# Patient Record
Sex: Male | Born: 1955 | Race: White | Hispanic: No | Smoking: Former smoker
Health system: Southern US, Community
[De-identification: ages and names within clinical notes are randomized; demographics above are authoritative.]

## PROBLEM LIST (undated history)

## (undated) ENCOUNTER — Ambulatory Visit

## (undated) ENCOUNTER — Encounter

## (undated) ENCOUNTER — Telehealth

## (undated) ENCOUNTER — Encounter: Attending: Cardiovascular Disease | Primary: Cardiovascular Disease

## (undated) ENCOUNTER — Encounter: Attending: Adult Health | Primary: Adult Health

## (undated) ENCOUNTER — Ambulatory Visit: Payer: MEDICARE | Attending: Cardiovascular Disease | Primary: Cardiovascular Disease

## (undated) ENCOUNTER — Ambulatory Visit: Payer: MEDICARE

## (undated) ENCOUNTER — Telehealth: Attending: Adult Health | Primary: Adult Health

## (undated) ENCOUNTER — Encounter
Attending: Pharmacist Clinician (PhC)/ Clinical Pharmacy Specialist | Primary: Pharmacist Clinician (PhC)/ Clinical Pharmacy Specialist

## (undated) DIAGNOSIS — I259 Chronic ischemic heart disease, unspecified: Secondary | ICD-10-CM

## (undated) DIAGNOSIS — E119 Type 2 diabetes mellitus without complications: Secondary | ICD-10-CM

## (undated) DIAGNOSIS — I5023 Acute on chronic systolic (congestive) heart failure: Secondary | ICD-10-CM

## (undated) DIAGNOSIS — Z5189 Encounter for other specified aftercare: Secondary | ICD-10-CM

## (undated) DIAGNOSIS — Z95810 Presence of automatic (implantable) cardiac defibrillator: Secondary | ICD-10-CM

## (undated) DIAGNOSIS — I472 Ventricular tachycardia: Secondary | ICD-10-CM

## (undated) DIAGNOSIS — I252 Old myocardial infarction: Secondary | ICD-10-CM

## (undated) DIAGNOSIS — R1013 Epigastric pain: Secondary | ICD-10-CM

## (undated) DIAGNOSIS — E785 Hyperlipidemia, unspecified: Secondary | ICD-10-CM

## (undated) DIAGNOSIS — E039 Hypothyroidism, unspecified: Secondary | ICD-10-CM

## (undated) DIAGNOSIS — N189 Chronic kidney disease, unspecified: Secondary | ICD-10-CM

## (undated) DIAGNOSIS — I509 Heart failure, unspecified: Secondary | ICD-10-CM

## (undated) DIAGNOSIS — K219 Gastro-esophageal reflux disease without esophagitis: Secondary | ICD-10-CM

## (undated) DIAGNOSIS — I2510 Atherosclerotic heart disease of native coronary artery without angina pectoris: Secondary | ICD-10-CM

## (undated) DIAGNOSIS — Z8639 Personal history of other endocrine, nutritional and metabolic disease: Secondary | ICD-10-CM

## (undated) DIAGNOSIS — Z862 Personal history of diseases of the blood and blood-forming organs and certain disorders involving the immune mechanism: Secondary | ICD-10-CM

## (undated) DIAGNOSIS — D649 Anemia, unspecified: Secondary | ICD-10-CM

## (undated) DIAGNOSIS — E669 Obesity, unspecified: Secondary | ICD-10-CM

## (undated) DIAGNOSIS — Z8679 Personal history of other diseases of the circulatory system: Secondary | ICD-10-CM

## (undated) DIAGNOSIS — E079 Disorder of thyroid, unspecified: Secondary | ICD-10-CM

## (undated) DIAGNOSIS — I10 Essential (primary) hypertension: Secondary | ICD-10-CM

## (undated) HISTORY — PX: CARDIAC ASSIST DEVICE REMOVAL: SHX1289

## (undated) HISTORY — DX: Acute on chronic systolic (congestive) heart failure: I50.23

## (undated) HISTORY — DX: Gastro-esophageal reflux disease without esophagitis: K21.9

## (undated) HISTORY — DX: Chronic ischemic heart disease, unspecified: I25.9

## (undated) HISTORY — DX: Ventricular tachycardia: I47.2

## (undated) HISTORY — PX: CORONARY ARTERY BYPASS GRAFT: SHX141

## (undated) HISTORY — DX: Heart failure, unspecified: I50.9

## (undated) HISTORY — DX: Type 2 diabetes mellitus without complications: E11.9

## (undated) HISTORY — PX: CARDIAC CATHETERIZATION: SHX172

## (undated) HISTORY — PX: COLONOSCOPY: SHX174

## (undated) HISTORY — DX: Essential (primary) hypertension: I10

## (undated) HISTORY — DX: Anemia, unspecified: D64.9

## (undated) HISTORY — DX: Epigastric pain: R10.13

## (undated) HISTORY — DX: Hyperlipidemia, unspecified: E78.5

## (undated) HISTORY — DX: Personal history of other endocrine, nutritional and metabolic disease: Z86.39

## (undated) HISTORY — DX: Obesity, unspecified: E66.9

## (undated) HISTORY — DX: Personal history of other diseases of the circulatory system: Z86.79

## (undated) HISTORY — DX: Presence of automatic (implantable) cardiac defibrillator: Z95.810

## (undated) HISTORY — DX: Atherosclerotic heart disease of native coronary artery without angina pectoris: I25.10

## (undated) HISTORY — DX: Encounter for other specified aftercare: Z51.89

## (undated) HISTORY — DX: Personal history of diseases of the blood and blood-forming organs and certain disorders involving the immune mechanism: Z86.2

## (undated) HISTORY — DX: Disorder of thyroid, unspecified: E07.9

## (undated) HISTORY — DX: Old myocardial infarction: I25.2

## (undated) HISTORY — PX: ANGIOPLASTY: SHX39

## (undated) MED ORDER — MAGNESIUM OXIDE 500 MG TABLET
Freq: Two times a day (BID) | ORAL | 0 days
Start: ? — End: 2020-10-22

## (undated) MED ORDER — EZETIMIBE 10 MG TABLET: Freq: Every day | ORAL | 0.00000 days

---
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
## 1998-10-29 ENCOUNTER — Emergency Department (HOSPITAL_COMMUNITY): Admission: EM | Admit: 1998-10-29 | Discharge: 1998-10-29 | Payer: Self-pay | Admitting: Emergency Medicine

## 1998-10-29 ENCOUNTER — Encounter: Payer: Self-pay | Admitting: Emergency Medicine

## 1999-04-20 ENCOUNTER — Inpatient Hospital Stay (HOSPITAL_COMMUNITY): Admission: AD | Admit: 1999-04-20 | Discharge: 1999-04-30 | Payer: Self-pay | Admitting: Internal Medicine

## 1999-04-20 ENCOUNTER — Encounter: Payer: Self-pay | Admitting: Internal Medicine

## 1999-04-21 ENCOUNTER — Encounter: Payer: Self-pay | Admitting: Cardiology

## 1999-04-23 ENCOUNTER — Encounter: Payer: Self-pay | Admitting: Cardiothoracic Surgery

## 1999-04-24 ENCOUNTER — Encounter: Payer: Self-pay | Admitting: Cardiothoracic Surgery

## 1999-04-26 ENCOUNTER — Encounter: Payer: Self-pay | Admitting: Cardiothoracic Surgery

## 1999-04-27 ENCOUNTER — Encounter: Payer: Self-pay | Admitting: Cardiothoracic Surgery

## 1999-04-28 ENCOUNTER — Encounter: Payer: Self-pay | Admitting: Cardiothoracic Surgery

## 2003-11-30 ENCOUNTER — Inpatient Hospital Stay (HOSPITAL_COMMUNITY): Admission: EM | Admit: 2003-11-30 | Discharge: 2003-12-17 | Payer: Self-pay

## 2003-12-03 ENCOUNTER — Encounter (INDEPENDENT_AMBULATORY_CARE_PROVIDER_SITE_OTHER): Payer: Self-pay | Admitting: Cardiology

## 2004-07-27 ENCOUNTER — Ambulatory Visit: Payer: Self-pay

## 2004-07-31 ENCOUNTER — Encounter: Admission: RE | Admit: 2004-07-31 | Discharge: 2004-07-31 | Payer: Self-pay | Admitting: Cardiology

## 2004-08-05 ENCOUNTER — Ambulatory Visit (HOSPITAL_COMMUNITY): Admission: RE | Admit: 2004-08-05 | Discharge: 2004-08-06 | Payer: Self-pay | Admitting: Cardiology

## 2004-08-11 ENCOUNTER — Inpatient Hospital Stay (HOSPITAL_COMMUNITY): Admission: EM | Admit: 2004-08-11 | Discharge: 2004-08-13 | Payer: Self-pay | Admitting: Emergency Medicine

## 2004-08-19 ENCOUNTER — Ambulatory Visit: Payer: Self-pay | Admitting: Gastroenterology

## 2004-09-18 ENCOUNTER — Encounter: Admission: RE | Admit: 2004-09-18 | Discharge: 2004-09-18 | Payer: Self-pay | Admitting: Cardiology

## 2004-09-24 ENCOUNTER — Ambulatory Visit (HOSPITAL_COMMUNITY): Admission: RE | Admit: 2004-09-24 | Discharge: 2004-09-24 | Payer: Self-pay | Admitting: *Deleted

## 2004-10-05 ENCOUNTER — Ambulatory Visit: Payer: Self-pay | Admitting: Gastroenterology

## 2004-12-08 ENCOUNTER — Ambulatory Visit: Payer: Self-pay | Admitting: Internal Medicine

## 2005-01-28 ENCOUNTER — Ambulatory Visit: Payer: Self-pay | Admitting: Internal Medicine

## 2005-02-15 ENCOUNTER — Ambulatory Visit: Payer: Self-pay | Admitting: Internal Medicine

## 2005-05-18 ENCOUNTER — Ambulatory Visit: Payer: Self-pay | Admitting: Internal Medicine

## 2005-06-02 ENCOUNTER — Ambulatory Visit: Payer: Self-pay | Admitting: Internal Medicine

## 2005-08-17 ENCOUNTER — Ambulatory Visit: Payer: Self-pay | Admitting: Internal Medicine

## 2005-11-12 ENCOUNTER — Ambulatory Visit: Payer: Self-pay | Admitting: Internal Medicine

## 2005-11-22 ENCOUNTER — Inpatient Hospital Stay (HOSPITAL_COMMUNITY): Admission: EM | Admit: 2005-11-22 | Discharge: 2005-11-25 | Payer: Self-pay | Admitting: Emergency Medicine

## 2005-12-06 IMAGING — XA IR CV CATH FLUORO GUIDE
1 series · 2 of 2 positions shown · non-contrast
Comparison: none

CLINICAL DATA: CHF, malpositioning of the right upper extremity PICC. 
 FLUOROSCOPIC GUIDANCE FOR RIGHT UPPER EXTREMITY PICC EXCHANGE, 12/10/03, 9923 HOURS
 Procedure:  The right PICC insertion site was prepped and draped in a sterile fashion.  Lidocaine was utilized for local anesthesia.  The existing PICC was cut and exchanged over a 018 wire for a new 5 french double-lumen PICC.  This was cut to 45 cm.  This was advanced over the wire to the cavoatrial junction.  The wire was removed.  The PICC was sewn to the skin.  The PICC was flushed.  No complications were encountered.

[Series 1: run · 2 of 2 slices shown]
[im 1/2]
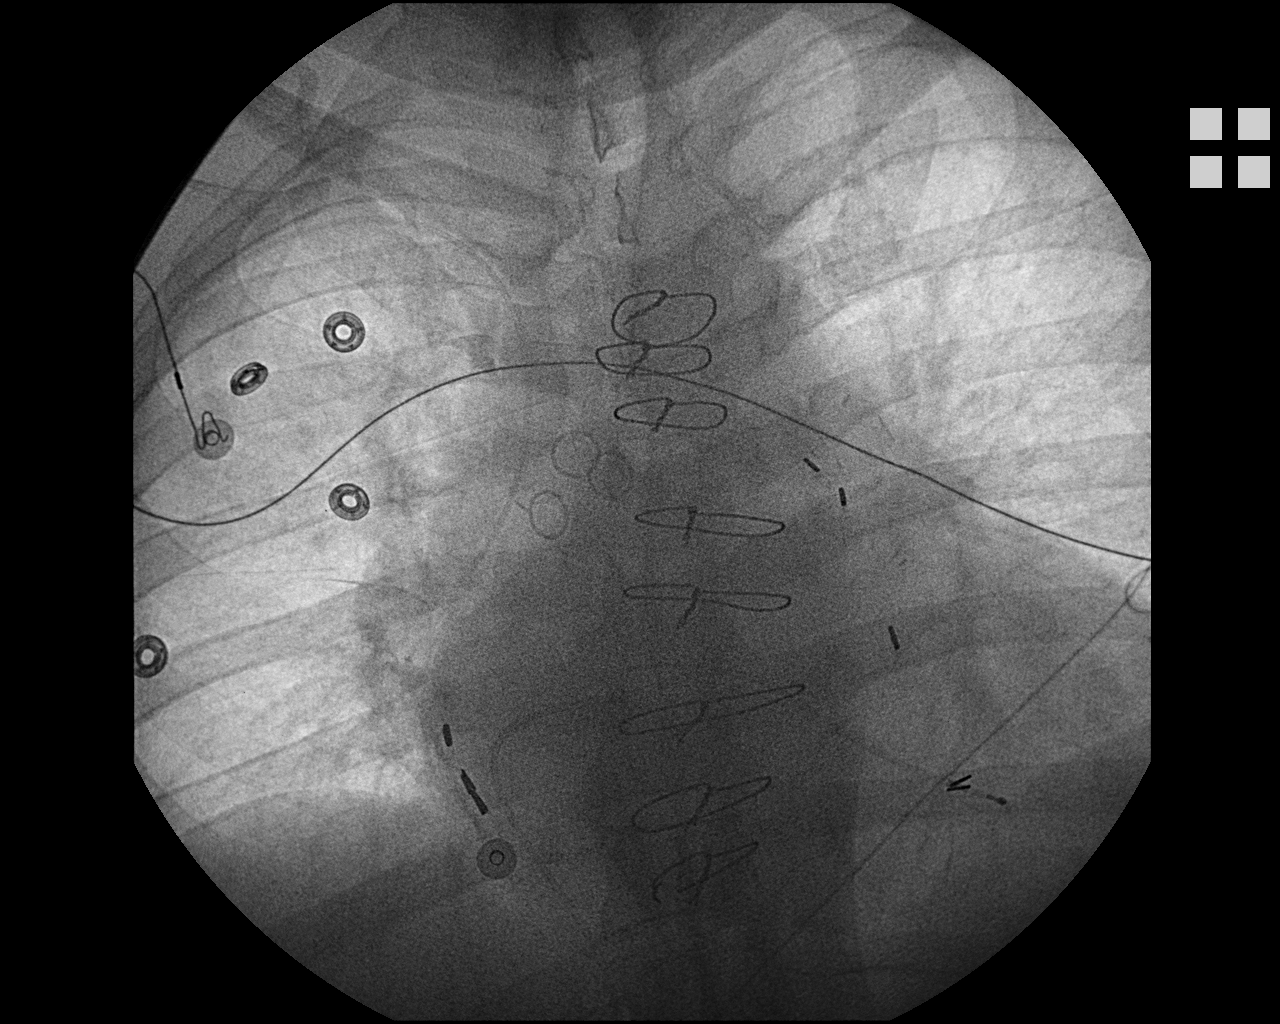
[im 2/2]
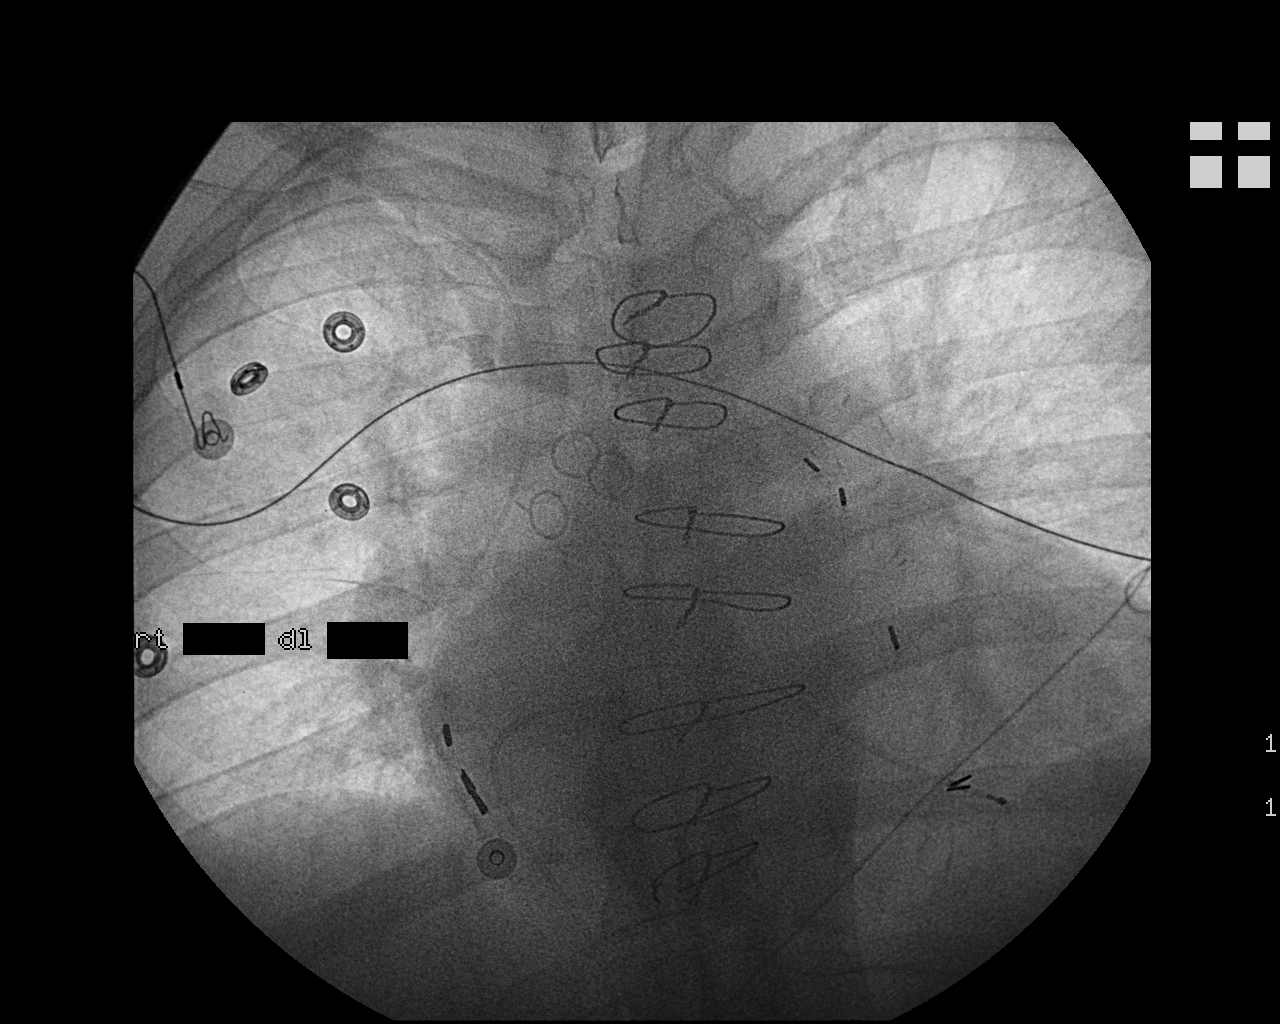

[2 of 2 positions shown; findings below may reference images not displayed]

FINDINGS: The image documents placement of a right upper extremity PICC with the tip at the cavoatrial junction.
 IMPRESSION
 Successful right upper extremity PICC exchange.

## 2006-03-15 ENCOUNTER — Ambulatory Visit: Payer: Self-pay | Admitting: Internal Medicine

## 2006-06-09 ENCOUNTER — Ambulatory Visit: Payer: Self-pay | Admitting: Internal Medicine

## 2006-07-08 ENCOUNTER — Ambulatory Visit: Payer: Self-pay

## 2006-09-13 ENCOUNTER — Ambulatory Visit: Payer: Self-pay | Admitting: Internal Medicine

## 2006-12-16 ENCOUNTER — Ambulatory Visit: Payer: Self-pay | Admitting: Internal Medicine

## 2007-03-27 ENCOUNTER — Ambulatory Visit: Payer: Self-pay | Admitting: Internal Medicine

## 2007-04-11 ENCOUNTER — Encounter: Payer: Self-pay | Admitting: Internal Medicine

## 2007-04-25 DIAGNOSIS — E785 Hyperlipidemia, unspecified: Secondary | ICD-10-CM

## 2007-04-25 DIAGNOSIS — I252 Old myocardial infarction: Secondary | ICD-10-CM | POA: Insufficient documentation

## 2007-04-25 DIAGNOSIS — E1169 Type 2 diabetes mellitus with other specified complication: Secondary | ICD-10-CM | POA: Insufficient documentation

## 2007-04-25 DIAGNOSIS — I2510 Atherosclerotic heart disease of native coronary artery without angina pectoris: Secondary | ICD-10-CM

## 2007-04-25 HISTORY — DX: Atherosclerotic heart disease of native coronary artery without angina pectoris: I25.10

## 2007-04-25 HISTORY — DX: Old myocardial infarction: I25.2

## 2007-04-25 HISTORY — DX: Hyperlipidemia, unspecified: E78.5

## 2007-06-15 ENCOUNTER — Ambulatory Visit: Payer: Self-pay | Admitting: Internal Medicine

## 2007-09-20 ENCOUNTER — Ambulatory Visit: Payer: Self-pay | Admitting: Gastroenterology

## 2007-09-21 ENCOUNTER — Encounter: Payer: Self-pay | Admitting: Internal Medicine

## 2007-09-21 ENCOUNTER — Encounter: Payer: Self-pay | Admitting: Gastroenterology

## 2007-09-21 ENCOUNTER — Ambulatory Visit: Payer: Self-pay | Admitting: Gastroenterology

## 2007-10-05 ENCOUNTER — Ambulatory Visit: Payer: Self-pay | Admitting: Internal Medicine

## 2007-10-26 ENCOUNTER — Ambulatory Visit: Payer: Self-pay | Admitting: Gastroenterology

## 2007-11-09 ENCOUNTER — Ambulatory Visit (HOSPITAL_COMMUNITY): Admission: RE | Admit: 2007-11-09 | Discharge: 2007-11-09 | Payer: Self-pay | Admitting: Gastroenterology

## 2008-01-24 ENCOUNTER — Ambulatory Visit: Payer: Self-pay | Admitting: Internal Medicine

## 2008-03-05 ENCOUNTER — Ambulatory Visit: Payer: Self-pay | Admitting: Internal Medicine

## 2008-03-05 LAB — CONVERTED CEMR LAB
Basophils Absolute: 0 10*3/uL (ref 0.0–0.1)
Bilirubin, Direct: 0.1 mg/dL (ref 0.0–0.3)
Calcium: 9.5 mg/dL (ref 8.4–10.5)
Cholesterol: 128 mg/dL (ref 0–200)
Eosinophils Absolute: 0.2 10*3/uL (ref 0.0–0.7)
GFR calc Af Amer: 82 mL/min
GFR calc non Af Amer: 68 mL/min
Glucose, Bld: 148 mg/dL — ABNORMAL HIGH (ref 70–99)
HCT: 34.9 % — ABNORMAL LOW (ref 39.0–52.0)
HDL: 24.7 mg/dL — ABNORMAL LOW (ref >39.0)
Hemoglobin: 12.1 g/dL — ABNORMAL LOW (ref 13.0–17.0)
MCHC: 34.5 g/dL (ref 30.0–36.0)
MCV: 94.4 fL (ref 78.0–100.0)
Monocytes Absolute: 0.7 10*3/uL (ref 0.1–1.0)
Neutro Abs: 7.5 10*3/uL (ref 1.4–7.7)
Platelets: 278 10*3/uL (ref 150–400)
RDW: 13.8 % (ref 11.5–14.6)
Sodium: 136 meq/L (ref 135–145)
TSH: 2.95 microintl units/mL (ref 0.35–5.50)
Total Bilirubin: 0.8 mg/dL (ref 0.3–1.2)
Total Protein: 7.7 g/dL (ref 6.0–8.3)
Triglycerides: 125 mg/dL (ref 0–149)

## 2008-03-06 ENCOUNTER — Encounter: Payer: Self-pay | Admitting: Internal Medicine

## 2008-04-02 ENCOUNTER — Encounter: Payer: Self-pay | Admitting: Internal Medicine

## 2008-04-03 ENCOUNTER — Ambulatory Visit: Payer: Self-pay | Admitting: Internal Medicine

## 2008-04-03 DIAGNOSIS — D649 Anemia, unspecified: Secondary | ICD-10-CM

## 2008-04-03 HISTORY — DX: Anemia, unspecified: D64.9

## 2008-04-03 LAB — CONVERTED CEMR LAB
Folate: 9.4 ng/mL
Transferrin: 321.5 mg/dL (ref >=212.0)
Vitamin B-12: 342 pg/mL (ref 211–911)

## 2008-04-24 ENCOUNTER — Encounter: Payer: Self-pay | Admitting: Internal Medicine

## 2008-05-14 DIAGNOSIS — E039 Hypothyroidism, unspecified: Secondary | ICD-10-CM | POA: Insufficient documentation

## 2008-05-14 DIAGNOSIS — Z8639 Personal history of other endocrine, nutritional and metabolic disease: Secondary | ICD-10-CM

## 2008-05-14 DIAGNOSIS — I152 Hypertension secondary to endocrine disorders: Secondary | ICD-10-CM | POA: Insufficient documentation

## 2008-05-14 DIAGNOSIS — Z8679 Personal history of other diseases of the circulatory system: Secondary | ICD-10-CM

## 2008-05-14 DIAGNOSIS — Z862 Personal history of diseases of the blood and blood-forming organs and certain disorders involving the immune mechanism: Secondary | ICD-10-CM

## 2008-05-14 DIAGNOSIS — I10 Essential (primary) hypertension: Secondary | ICD-10-CM | POA: Insufficient documentation

## 2008-05-14 HISTORY — DX: Personal history of diseases of the blood and blood-forming organs and certain disorders involving the immune mechanism: Z86.2

## 2008-05-14 HISTORY — DX: Personal history of diseases of the blood and blood-forming organs and certain disorders involving the immune mechanism: Z86.39

## 2008-05-14 HISTORY — DX: Personal history of other diseases of the circulatory system: Z86.79

## 2008-05-16 ENCOUNTER — Ambulatory Visit: Payer: Self-pay | Admitting: Gastroenterology

## 2008-05-21 ENCOUNTER — Ambulatory Visit: Payer: Self-pay | Admitting: Internal Medicine

## 2008-06-04 ENCOUNTER — Ambulatory Visit: Payer: Self-pay | Admitting: Gastroenterology

## 2008-06-04 ENCOUNTER — Encounter: Payer: Self-pay | Admitting: Gastroenterology

## 2008-06-05 ENCOUNTER — Encounter: Payer: Self-pay | Admitting: Gastroenterology

## 2008-07-02 ENCOUNTER — Encounter: Payer: Self-pay | Admitting: Internal Medicine

## 2008-08-07 ENCOUNTER — Encounter: Payer: Self-pay | Admitting: Internal Medicine

## 2008-08-19 ENCOUNTER — Encounter: Payer: Self-pay | Admitting: Internal Medicine

## 2008-09-05 ENCOUNTER — Ambulatory Visit: Payer: Self-pay | Admitting: Internal Medicine

## 2008-09-23 ENCOUNTER — Telehealth: Payer: Self-pay | Admitting: Internal Medicine

## 2008-10-17 ENCOUNTER — Encounter: Payer: Self-pay | Admitting: Internal Medicine

## 2008-12-02 ENCOUNTER — Ambulatory Visit: Payer: Self-pay | Admitting: Family Medicine

## 2008-12-02 LAB — CONVERTED CEMR LAB
BUN: 24 mg/dL — ABNORMAL HIGH (ref 6–23)
Chloride: 89 meq/L — ABNORMAL LOW (ref 96–112)
Creatinine, Ser: 1.1 mg/dL (ref 0.4–1.5)
GFR calc non Af Amer: 74.72 mL/min (ref >60)
Glucose, Bld: 481 mg/dL — ABNORMAL HIGH (ref 70–99)
HCT: 37.9 % — ABNORMAL LOW (ref 39.0–52.0)
Hemoglobin: 13.4 g/dL (ref 13.0–17.0)
Lymphs Abs: 1.4 10*3/uL (ref 0.7–4.0)
MCHC: 35.4 g/dL (ref 30.0–36.0)
Monocytes Absolute: 0.7 10*3/uL (ref 0.1–1.0)
Neutro Abs: 8.2 10*3/uL — ABNORMAL HIGH (ref 1.4–7.7)
Potassium: 3.9 meq/L (ref 3.5–5.1)
RDW: 13.9 % (ref 11.5–14.6)

## 2008-12-03 ENCOUNTER — Telehealth: Payer: Self-pay | Admitting: Family Medicine

## 2008-12-05 ENCOUNTER — Ambulatory Visit: Payer: Self-pay | Admitting: Family Medicine

## 2008-12-09 LAB — CONVERTED CEMR LAB: Hgb A1c MFr Bld: 10.2 % — ABNORMAL HIGH (ref 4.6–6.5)

## 2008-12-13 ENCOUNTER — Ambulatory Visit: Payer: Self-pay | Admitting: Internal Medicine

## 2008-12-13 DIAGNOSIS — E119 Type 2 diabetes mellitus without complications: Secondary | ICD-10-CM | POA: Insufficient documentation

## 2008-12-13 HISTORY — DX: Type 2 diabetes mellitus without complications: E11.9

## 2008-12-24 ENCOUNTER — Ambulatory Visit: Payer: Self-pay | Admitting: Internal Medicine

## 2009-01-01 ENCOUNTER — Encounter: Admission: RE | Admit: 2009-01-01 | Discharge: 2009-01-16 | Payer: Self-pay | Admitting: Internal Medicine

## 2009-01-01 ENCOUNTER — Encounter: Payer: Self-pay | Admitting: Internal Medicine

## 2009-01-02 ENCOUNTER — Encounter (INDEPENDENT_AMBULATORY_CARE_PROVIDER_SITE_OTHER): Payer: Self-pay | Admitting: *Deleted

## 2009-01-09 ENCOUNTER — Encounter: Payer: Self-pay | Admitting: Internal Medicine

## 2009-01-14 ENCOUNTER — Encounter: Payer: Self-pay | Admitting: Internal Medicine

## 2009-01-14 ENCOUNTER — Encounter: Admission: RE | Admit: 2009-01-14 | Discharge: 2009-01-14 | Payer: Self-pay | Admitting: Cardiology

## 2009-01-16 ENCOUNTER — Inpatient Hospital Stay (HOSPITAL_COMMUNITY): Admission: RE | Admit: 2009-01-16 | Discharge: 2009-01-18 | Payer: Self-pay | Admitting: Cardiology

## 2009-01-20 ENCOUNTER — Telehealth (INDEPENDENT_AMBULATORY_CARE_PROVIDER_SITE_OTHER): Payer: Self-pay | Admitting: *Deleted

## 2009-01-28 ENCOUNTER — Encounter: Payer: Self-pay | Admitting: Internal Medicine

## 2009-01-29 ENCOUNTER — Encounter: Payer: Self-pay | Admitting: Internal Medicine

## 2009-01-30 ENCOUNTER — Encounter (HOSPITAL_COMMUNITY): Admission: RE | Admit: 2009-01-30 | Discharge: 2009-04-30 | Payer: Self-pay | Admitting: Cardiology

## 2009-02-04 ENCOUNTER — Encounter: Payer: Self-pay | Admitting: Internal Medicine

## 2009-02-05 ENCOUNTER — Telehealth: Payer: Self-pay | Admitting: Internal Medicine

## 2009-02-17 ENCOUNTER — Encounter: Payer: Self-pay | Admitting: Internal Medicine

## 2009-02-26 DIAGNOSIS — I472 Ventricular tachycardia, unspecified: Secondary | ICD-10-CM | POA: Insufficient documentation

## 2009-02-26 DIAGNOSIS — I259 Chronic ischemic heart disease, unspecified: Secondary | ICD-10-CM

## 2009-02-26 DIAGNOSIS — I4729 Other ventricular tachycardia: Secondary | ICD-10-CM

## 2009-02-26 DIAGNOSIS — Z95810 Presence of automatic (implantable) cardiac defibrillator: Secondary | ICD-10-CM

## 2009-02-26 DIAGNOSIS — E669 Obesity, unspecified: Secondary | ICD-10-CM

## 2009-02-26 HISTORY — DX: Presence of automatic (implantable) cardiac defibrillator: Z95.810

## 2009-02-26 HISTORY — DX: Obesity, unspecified: E66.9

## 2009-02-26 HISTORY — DX: Chronic ischemic heart disease, unspecified: I25.9

## 2009-02-26 HISTORY — DX: Ventricular tachycardia: I47.2

## 2009-02-26 HISTORY — DX: Other ventricular tachycardia: I47.29

## 2009-02-26 HISTORY — DX: Ventricular tachycardia, unspecified: I47.20

## 2009-02-27 ENCOUNTER — Ambulatory Visit: Payer: Self-pay | Admitting: Internal Medicine

## 2009-03-02 DIAGNOSIS — I5023 Acute on chronic systolic (congestive) heart failure: Secondary | ICD-10-CM

## 2009-03-02 DIAGNOSIS — I5022 Chronic systolic (congestive) heart failure: Secondary | ICD-10-CM | POA: Insufficient documentation

## 2009-03-02 HISTORY — DX: Acute on chronic systolic (congestive) heart failure: I50.23

## 2009-03-13 ENCOUNTER — Ambulatory Visit: Payer: Self-pay | Admitting: Internal Medicine

## 2009-03-13 ENCOUNTER — Ambulatory Visit: Payer: Self-pay

## 2009-03-13 ENCOUNTER — Encounter: Payer: Self-pay | Admitting: Internal Medicine

## 2009-03-13 LAB — CONVERTED CEMR LAB
Basophils Absolute: 0 10*3/uL (ref 0.0–0.1)
Basophils Relative: 0.1 % (ref 0.0–3.0)
CO2: 27 meq/L (ref 19–32)
Chloride: 102 meq/L (ref 96–112)
Eosinophils Absolute: 0.1 10*3/uL (ref 0.0–0.7)
Glucose, Bld: 88 mg/dL (ref 70–99)
HCT: 32.1 % — ABNORMAL LOW (ref 39.0–52.0)
Hemoglobin: 10.8 g/dL — ABNORMAL LOW (ref 13.0–17.0)
Lymphocytes Relative: 12.2 % (ref 12.0–46.0)
Lymphs Abs: 1.1 10*3/uL (ref 0.7–4.0)
MCHC: 33.6 g/dL (ref 30.0–36.0)
MCV: 92.1 fL (ref 78.0–100.0)
Neutro Abs: 7.1 10*3/uL (ref 1.4–7.7)
Potassium: 4.6 meq/L (ref 3.5–5.1)
RBC: 3.49 M/uL — ABNORMAL LOW (ref 4.22–5.81)
RDW: 14.7 % — ABNORMAL HIGH (ref 11.5–14.6)
Sodium: 136 meq/L (ref 135–145)
aPTT: 29.2 s — ABNORMAL HIGH (ref 21.7–28.8)

## 2009-03-20 ENCOUNTER — Inpatient Hospital Stay (HOSPITAL_COMMUNITY): Admission: RE | Admit: 2009-03-20 | Discharge: 2009-03-21 | Payer: Self-pay | Admitting: Internal Medicine

## 2009-03-20 ENCOUNTER — Ambulatory Visit: Payer: Self-pay | Admitting: Internal Medicine

## 2009-03-21 ENCOUNTER — Encounter: Payer: Self-pay | Admitting: Internal Medicine

## 2009-03-25 ENCOUNTER — Encounter: Payer: Self-pay | Admitting: Internal Medicine

## 2009-03-27 ENCOUNTER — Telehealth: Payer: Self-pay | Admitting: Gastroenterology

## 2009-03-28 ENCOUNTER — Ambulatory Visit: Payer: Self-pay | Admitting: Internal Medicine

## 2009-03-28 ENCOUNTER — Ambulatory Visit: Payer: Self-pay

## 2009-03-28 ENCOUNTER — Encounter: Payer: Self-pay | Admitting: Internal Medicine

## 2009-03-31 ENCOUNTER — Telehealth: Payer: Self-pay | Admitting: Gastroenterology

## 2009-03-31 ENCOUNTER — Ambulatory Visit: Payer: Self-pay | Admitting: Gastroenterology

## 2009-03-31 DIAGNOSIS — R1013 Epigastric pain: Secondary | ICD-10-CM | POA: Insufficient documentation

## 2009-03-31 HISTORY — DX: Epigastric pain: R10.13

## 2009-04-09 ENCOUNTER — Telehealth: Payer: Self-pay | Admitting: Gastroenterology

## 2009-04-10 ENCOUNTER — Ambulatory Visit: Payer: Self-pay | Admitting: Gastroenterology

## 2009-04-11 ENCOUNTER — Telehealth: Payer: Self-pay | Admitting: Gastroenterology

## 2009-04-14 ENCOUNTER — Encounter: Admission: RE | Admit: 2009-04-14 | Discharge: 2009-04-14 | Payer: Self-pay | Admitting: Cardiology

## 2009-04-14 ENCOUNTER — Encounter: Payer: Self-pay | Admitting: Internal Medicine

## 2009-04-16 ENCOUNTER — Ambulatory Visit: Payer: Self-pay | Admitting: Gastroenterology

## 2009-04-16 ENCOUNTER — Inpatient Hospital Stay (HOSPITAL_COMMUNITY): Admission: RE | Admit: 2009-04-16 | Discharge: 2009-04-21 | Payer: Self-pay | Admitting: Cardiology

## 2009-04-16 ENCOUNTER — Ambulatory Visit: Payer: Self-pay | Admitting: Pulmonary Disease

## 2009-05-07 HISTORY — PX: LEFT VENTRICULAR ASSIST DEVICE: SHX2679

## 2009-05-22 ENCOUNTER — Encounter: Payer: Self-pay | Admitting: Internal Medicine

## 2009-05-23 ENCOUNTER — Encounter: Payer: Self-pay | Admitting: Internal Medicine

## 2009-05-24 ENCOUNTER — Encounter: Payer: Self-pay | Admitting: Internal Medicine

## 2009-05-26 ENCOUNTER — Encounter: Payer: Self-pay | Admitting: Internal Medicine

## 2009-06-02 ENCOUNTER — Ambulatory Visit: Payer: Self-pay | Admitting: Internal Medicine

## 2009-06-03 ENCOUNTER — Encounter: Payer: Self-pay | Admitting: Internal Medicine

## 2009-06-10 ENCOUNTER — Encounter: Payer: Self-pay | Admitting: Internal Medicine

## 2009-06-13 ENCOUNTER — Encounter: Payer: Self-pay | Admitting: Internal Medicine

## 2009-06-17 ENCOUNTER — Encounter (INDEPENDENT_AMBULATORY_CARE_PROVIDER_SITE_OTHER): Payer: Self-pay | Admitting: *Deleted

## 2009-06-18 ENCOUNTER — Ambulatory Visit: Payer: Self-pay | Admitting: Internal Medicine

## 2009-06-24 ENCOUNTER — Encounter (INDEPENDENT_AMBULATORY_CARE_PROVIDER_SITE_OTHER): Payer: Self-pay | Admitting: *Deleted

## 2009-07-08 ENCOUNTER — Encounter: Payer: Self-pay | Admitting: Internal Medicine

## 2009-07-17 ENCOUNTER — Encounter (INDEPENDENT_AMBULATORY_CARE_PROVIDER_SITE_OTHER): Payer: Self-pay | Admitting: *Deleted

## 2009-07-22 ENCOUNTER — Encounter (INDEPENDENT_AMBULATORY_CARE_PROVIDER_SITE_OTHER): Payer: Self-pay | Admitting: *Deleted

## 2009-08-05 ENCOUNTER — Encounter (INDEPENDENT_AMBULATORY_CARE_PROVIDER_SITE_OTHER): Payer: Self-pay | Admitting: *Deleted

## 2009-08-14 ENCOUNTER — Encounter (HOSPITAL_COMMUNITY): Admission: RE | Admit: 2009-08-14 | Discharge: 2009-09-05 | Payer: Self-pay | Admitting: Cardiology

## 2009-08-28 ENCOUNTER — Encounter: Payer: Self-pay | Admitting: Internal Medicine

## 2009-09-06 ENCOUNTER — Encounter (HOSPITAL_COMMUNITY): Admission: RE | Admit: 2009-09-06 | Discharge: 2009-12-05 | Payer: Self-pay | Admitting: Cardiology

## 2009-09-08 ENCOUNTER — Encounter: Payer: Self-pay | Admitting: Internal Medicine

## 2009-09-18 ENCOUNTER — Encounter: Payer: Self-pay | Admitting: Internal Medicine

## 2009-10-06 ENCOUNTER — Ambulatory Visit: Payer: Self-pay | Admitting: Cardiology

## 2009-10-06 ENCOUNTER — Encounter: Payer: Self-pay | Admitting: Internal Medicine

## 2009-10-06 ENCOUNTER — Ambulatory Visit: Payer: Self-pay | Admitting: Internal Medicine

## 2009-10-06 ENCOUNTER — Ambulatory Visit (HOSPITAL_COMMUNITY): Admission: RE | Admit: 2009-10-06 | Discharge: 2009-10-06 | Payer: Self-pay | Admitting: Cardiology

## 2009-10-06 ENCOUNTER — Ambulatory Visit: Payer: Self-pay

## 2009-12-03 ENCOUNTER — Encounter: Payer: Self-pay | Admitting: Internal Medicine

## 2009-12-23 ENCOUNTER — Encounter: Payer: Self-pay | Admitting: Internal Medicine

## 2010-03-03 ENCOUNTER — Encounter: Payer: Self-pay | Admitting: Internal Medicine

## 2010-03-16 ENCOUNTER — Ambulatory Visit (HOSPITAL_COMMUNITY): Admission: RE | Admit: 2010-03-16 | Discharge: 2010-03-16 | Payer: Self-pay | Admitting: Internal Medicine

## 2010-03-16 ENCOUNTER — Ambulatory Visit: Payer: Self-pay | Admitting: Cardiology

## 2010-03-16 ENCOUNTER — Encounter: Payer: Self-pay | Admitting: Internal Medicine

## 2010-03-16 ENCOUNTER — Ambulatory Visit: Payer: Self-pay | Admitting: Internal Medicine

## 2010-03-18 ENCOUNTER — Encounter: Payer: Self-pay | Admitting: Internal Medicine

## 2010-04-21 ENCOUNTER — Encounter: Payer: Self-pay | Admitting: Internal Medicine

## 2010-05-25 ENCOUNTER — Encounter: Payer: Self-pay | Admitting: Internal Medicine

## 2010-05-27 ENCOUNTER — Encounter: Payer: Self-pay | Admitting: Internal Medicine

## 2010-08-06 ENCOUNTER — Encounter: Payer: Self-pay | Admitting: Internal Medicine

## 2010-09-03 ENCOUNTER — Encounter (INDEPENDENT_AMBULATORY_CARE_PROVIDER_SITE_OTHER): Payer: Self-pay | Admitting: *Deleted

## 2010-09-17 ENCOUNTER — Encounter: Payer: Self-pay | Admitting: Internal Medicine

## 2010-10-05 ENCOUNTER — Ambulatory Visit
Admission: RE | Admit: 2010-10-05 | Discharge: 2010-10-05 | Payer: Self-pay | Source: Home / Self Care | Attending: Internal Medicine | Admitting: Internal Medicine

## 2010-10-05 ENCOUNTER — Ambulatory Visit: Admission: RE | Admit: 2010-10-05 | Discharge: 2010-10-05 | Payer: Self-pay | Source: Home / Self Care

## 2010-10-05 ENCOUNTER — Encounter: Payer: Self-pay | Admitting: Internal Medicine

## 2010-10-06 NOTE — Letter (Signed)
Summary: [facility] at [facility] at [facility]   Imported By: Maryln Gottron 05/06/2010 15:34:52  _____________________________________________________________________  External Attachment:    Type:   Image     Comment:   External Document

## 2010-10-06 NOTE — Letter (Signed)
Summary: [facility]   Imported By: Maryln Gottron 09/18/2009 12:20:08  _____________________________________________________________________  External Attachment:    Type:   Image     Comment:   External Document

## 2010-10-06 NOTE — Letter (Signed)
Summary: [facility] Thoracic Surgery  [facility] Thoracic Surgery   Imported By: Maryln Gottron 06/11/2010 13:07:58  _____________________________________________________________________  External Attachment:    Type:   Image     Comment:   External Document

## 2010-10-06 NOTE — Assessment & Plan Note (Signed)
Summary: PER CHECK OUT/SF   Visit Type:  Follow-up Referring Provider:  n/a Primary Provider:  Birdie Sons MD   History of Present Illness: [name] returns today for followup.  He is a pleasant middle aged man with a h/o ICM, s/p PCI, s/p ICD who developed worsening CHF and underwent insertion of an LVAD about 12 months ago.   His ICD was re- programmed with a VFand VT zone only despite having multiple episodes of VT after his LVAD placement.   He denies c/p or sob.  He is walking but does get tired.  He is currently listed for heart transplant but has been told that he needs to lose weight.  He is exercising several times a week.   No other complaints.  Current Medications (verified): 1)  Levoxyl 25 Mcg  Tabs (Levothyroxine Sodium) .[name].. 1 Once Daily 2)  Furosemide 40 Mg Tabs (Furosemide) .... Take 1 Tablet By Mouth Once A Day 3)  Fish Oil 1000 Mg  Caps (Omega-3 Fatty Acids) .... 3 By Mouth Once Daily 4)  Coumadin 1 Mg Tabs (Warfarin Sodium) .... As Directed 5)  Warfarin Sodium 5 Mg Tabs (Warfarin Sodium) .... As Directed 6)  Omeprazole 20 Mg Cpdr (Omeprazole) .... Once Daily 7)  Vitamin D 1000 Unit  Tabs (Cholecalciferol) .... 2 Tabs Once Daily 8)  Lipitor 80 Mg Tabs (Atorvastatin Calcium) .... Once Daily 9)  Enalapril Maleate 10 Mg Tabs (Enalapril Maleate) .... Take One Tablet By Mouth Twice A Day 10)  Ferro-Bob 325 (65 Fe) Mg Tabs (Ferrous Sulfate) .... Take 1 Tablet By Mouth Two Times A Day 11)  Coreg 3.125 Mg Tabs (Carvedilol) .... Take 1 Tablet By Mouth Two Times A Day 12)  Bactrim Ds 800-160 Mg Tabs (Sulfamethoxazole-Trimethoprim) .... Take 1 Tablet By Mouth Two Times A Day  Allergies (verified): No Known Drug Allergies  Past History:  Past Medical History: Last updated: 02/26/2009 OBESITY (ICD-278.00) ISCHEMIC HEART DISEASE (ICD-414.9) AUTOMATIC IMPLANTABLE CARDIAC DEFIBRILLATOR SITU (ICD-V45.02) VENTRICULAR TACHYCARDIA (ICD-427.1) DIABETES MELLITUS, TYPE II  (ICD-250.00) THYROID DISEASE, HX OF (ICD-V12.2) HYPERTENSION, HX OF (ICD-V12.50) ANEMIA, OTHER UNSPEC (ICD-285.9) PREVENTIVE HEALTH CARE (ICD-V70.0) SPECIAL SCREENING FOR MALIGNANT NEOPLASMS COLON (ICD-V76.51) MYOCARDIAL INFARCTION, HX OF (ICD-412) HYPERLIPIDEMIA (ICD-272.4) CORONARY ARTERY DISEASE (ICD-414.00)  Past Surgical History: Last updated: 02/26/2009 Coronary artery bypass graft Aicd  Medtronic Maximo [id number]  12/10/2003  Duke Salvia, M.D.   Revision of atrial lead which had dislodged.  Doylene Canning. Ladona Ridgel, M.D.     12/11/2003     CARDIAC CATHETERIZATION  x 6 2005 (4),2006,2010  Review of Systems       The patient complains of dyspnea on exertion.  The patient denies chest pain, syncope, and peripheral edema.    Vital Signs:  Patient profile:   55 year old male Height:      68.5 inches Weight:      249 pounds BMI:     37.44 Pulse rate:   77 / minute Pulse rhythm:   regular Resp:     18 per minute  Vitals Entered By: Vikki Ports (March 16, 2010 11:57 AM)  Physical Exam  General:  Well developed, well nourished, in no acute distress. Head:  normocephalic and atraumatic Eyes:  PERRLA/EOM intact; conjunctiva and lids normal. Mouth:  Teeth, gums and palate normal. Oral mucosa normal. Neck:  Neck supple, no JVD. No masses, thyromegaly or abnormal cervical nodes. Chest Wall:  Well healed ICD incision. Lungs:  Clear bilaterally except for rare rales in the bases.  Heart:  Distant heart sounds appreciated.  A continuous high frequency sound is heard best at the apex. Abdomen:  Bowel sounds positive; abdomen soft and non-tender without masses, organomegaly, or hernias noted. No hepatosplenomegaly.His insertion site has minimal erythema. Msk:  Back normal, normal gait. Muscle strength and tone normal. Pulses:  pulses normal in all 4 extremities Extremities:  1+ edema. Neurologic:  Alert and oriented x 3.    ICD Specifications Following MD:  Lewayne Bunting, MD      Referring MD:  LITTLE ICD Vendor:  Medtronic     ICD Model Number:  [phone]     ICD Serial Number:  [id number] H ICD DOI:  03/20/2009     ICD Implanting MD:  Lewayne Bunting, MD Research Study Name aCRT  Lead 1:    Location: RA     DOI: 12/10/2003     Model #: 9562     Serial #: [id number]     Status: active Lead 2:    Location: RV     DOI: 12/10/2003     Model #: 6962     Serial #: [id number]     Status: active Lead 3:    Location: RV     DOI: 03/20/2009     Model #: 4010     Serial #: [id number]     Status: active Lead 4:    Location: LV     DOI: 03/20/2009     Model #: 4196     Serial #: [id number]     Status: active  Indications::  MONOMORPHIC VT, ICM  Explantation Comments: 03/20/2009 Maximo [id number] H explanted  ICD Follow Up Remote Check?  No Battery Voltage:  3.13 V     Charge Time:  9.4 seconds     Underlying rhythm:  SB ICD Dependent:  No       ICD Device Measurements Atrium:  Amplitude: 1.5 mV, Impedance: 475 ohms, Threshold: 0.75 V at 0.4 msec Right Ventricle:  Amplitude: 8.0 mV, Impedance: 418 ohms, Threshold: 0.75 V at 0.4 msec Left Ventricle:  Impedance: 418 ohms, Threshold: 0.625 V at 0.4 msec Shock Impedance: 45/56 ohms   Episodes MS Episodes:  0     Coumadin:  No Shock:  0     ATP:  0     Nonsustained:  1     Atrial Pacing:  0.7%     Ventricular Pacing:  98.3%  Brady Parameters Mode DDD     Lower Rate Limit:  50     Upper Rate Limit 130 PAV 130     Sensed AV Delay:  120  Tachy Zones VF:  222     VT:  171     VT1:  OFF     Next Cardiology Appt Due:  06/08/2010 Tech Comments:  Normal device function.  Checked by industry for [employer] study. Gypsy Balsam RN BSN  March 16, 2010 12:09 PM  MD Comments:  Agree with above.  Impression & Recommendations:  Problem # 1:  ACUTE ON CHRONIC SYSTOLIC HEART FAILURE (ICD-428.23) Along with his LVAD, his CHF symptoms are class 2.  He will continue his medical therapy and maintain a low sodium diet. The following medications were  removed from the medication list:    Pacerone 200 Mg Tabs (Amiodarone hcl) .[name]... 2 tabs once daily    Metolazone 2.5 Mg Tabs (Metolazone) .[name]... Take one tablet by mouth on wednesdays. His updated medication list for this problem includes:    Furosemide 40 Mg Tabs (Furosemide) .Marland KitchenMarland KitchenMarland KitchenMarland Kitchen  Take 1 tablet by mouth once a day    Coumadin 1 Mg Tabs (Warfarin sodium) .[name]... As directed    Warfarin Sodium 5 Mg Tabs (Warfarin sodium) .[name]... As directed    Enalapril Maleate 10 Mg Tabs (Enalapril maleate) .[name]... Take one tablet by mouth twice a day    Coreg 3.125 Mg Tabs (Carvedilol) .[name]... Take 1 tablet by mouth two times a day  Problem # 2:  VENTRICULAR TACHYCARDIA (ICD-427.1) He has maintained NSR with no VT despite being taken off of amiodarone. Will follow. The following medications were removed from the medication list:    Pacerone 200 Mg Tabs (Amiodarone hcl) .[name]... 2 tabs once daily His updated medication list for this problem includes:    Coumadin 1 Mg Tabs (Warfarin sodium) .[name]... As directed    Warfarin Sodium 5 Mg Tabs (Warfarin sodium) .[name]... As directed    Enalapril Maleate 10 Mg Tabs (Enalapril maleate) .[name]... Take one tablet by mouth twice a day    Coreg 3.125 Mg Tabs (Carvedilol) .[name]... Take 1 tablet by mouth two times a day  Problem # 3:  ISCHEMIC HEART DISEASE (ICD-414.9) Continue current meds and maintain a low fat diet. His updated medication list for this problem includes:    Coumadin 1 Mg Tabs (Warfarin sodium) .[name]... As directed    Warfarin Sodium 5 Mg Tabs (Warfarin sodium) .[name]... As directed    Enalapril Maleate 10 Mg Tabs (Enalapril maleate) .[name]... Take one tablet by mouth twice a day    Coreg 3.125 Mg Tabs (Carvedilol) .[name]... Take 1 tablet by mouth two times a day  Prevention & Chronic Care Immunizations   Influenza vaccine: Historical  (06/25/2008)    Tetanus booster: Not documented    Pneumococcal vaccine: Not documented  Colorectal Screening   Hemoccult: Not documented     Colonoscopy: Location:  [facility].    (06/04/2008)   Colonoscopy due: 05/2018  Other Screening   PSA: 0.67  (03/05/2008)   Smoking status: quit  (04/03/2008)  Diabetes Mellitus   HgbA1C: 10.2  (12/05/2008)    Eye exam: Not documented    Foot exam: Not documented   High risk foot: Not documented   Foot care education: Not documented    Urine microalbumin/creatinine ratio: Not documented  Lipids   Total Cholesterol: 128  (03/05/2008)   LDL: 78  (03/05/2008)   LDL Direct: Not documented   HDL: 24.7  (03/05/2008)   Triglycerides: 125  (03/05/2008)    SGOT (AST): 23  (03/05/2008)   SGPT (ALT): 26  (03/05/2008)   Alkaline phosphatase: 55  (03/05/2008)   Total bilirubin: 0.8  (03/05/2008)  Self-Management Support :    Diabetes self-management support: Not documented    Lipid self-management support: Not documented

## 2010-10-06 NOTE — Letter (Signed)
Summary: [facility]   Imported By: Sherian Rein 02/24/2009 07:56:15  _____________________________________________________________________  External Attachment:    Type:   Image     Comment:   External Document

## 2010-10-06 NOTE — Letter (Signed)
Summary: [facility] at [facility] at [facility]   Imported By: Maryln Gottron 01/05/2010 10:01:12  _____________________________________________________________________  External Attachment:    Type:   Image     Comment:   External Document

## 2010-10-06 NOTE — Letter (Signed)
Summary: [facility] at [facility] at [facility]   Imported By: Maryln Gottron 12/23/2009 09:00:36  _____________________________________________________________________  External Attachment:    Type:   Image     Comment:   External Document

## 2010-10-06 NOTE — Assessment & Plan Note (Signed)
Summary: ROV/PER SUE IN RESEARCH   Referring Provider:  n/a Primary Provider:  Birdie Sons MD  CC:  rov.  History of Present Illness: Mr. Petite returns today for followup.  He is a pleasant middle aged man with a h/o ICM, s/p PCI, s/p ICD who developed worsening CHF and underwent insertion of an LVAD several months ago.   His ICD was re- programmed with a VFand VT zone only despite having multiple episodes of VT after his LVAD placement.   He denies c/p or sob.  He is walking but does get tired.  He is currently listed for heart transplant. No other complaints.  Current Medications (verified): 1)  Levoxyl 25 Mcg  Tabs (Levothyroxine Sodium) .[name].. 1 Once Daily 2)  Furosemide 40 Mg Tabs (Furosemide) .... Two Times A Day 3)  Fish Oil 1000 Mg  Caps (Omega-3 Fatty Acids) .... 3 By Mouth Once Daily 4)  Metformin Hcl 500 Mg Tabs (Metformin Hcl) .... One By Mouth Two Times A Day 5)  Pacerone 200 Mg Tabs (Amiodarone Hcl) .... 2 Tabs Once Daily 6)  Coumadin 1 Mg Tabs (Warfarin Sodium) .... As Directed 7)  Warfarin Sodium 5 Mg Tabs (Warfarin Sodium) .... As Directed 8)  Omeprazole 20 Mg Cpdr (Omeprazole) .... Once Daily 9)  Magnesium Oxide 400 Mg Tabs (Magnesium Oxide) .... Two Times A Day 10)  Vitamin D 1000 Unit  Tabs (Cholecalciferol) .... 2 Tabs Once Daily 11)  Lipitor 80 Mg Tabs (Atorvastatin Calcium) .... Once Daily 12)  Metolazone 2.5 Mg Tabs (Metolazone) .... Take One Tablet By Mouth On Wednesdays.  Allergies (verified): No Known Drug Allergies  Past History:  Past Medical History: Last updated: 02/26/2009 OBESITY (ICD-278.00) ISCHEMIC HEART DISEASE (ICD-414.9) AUTOMATIC IMPLANTABLE CARDIAC DEFIBRILLATOR SITU (ICD-V45.02) VENTRICULAR TACHYCARDIA (ICD-427.1) DIABETES MELLITUS, TYPE II (ICD-250.00) THYROID DISEASE, HX OF (ICD-V12.2) HYPERTENSION, HX OF (ICD-V12.50) ANEMIA, OTHER UNSPEC (ICD-285.9) PREVENTIVE HEALTH CARE (ICD-V70.0) SPECIAL SCREENING FOR MALIGNANT NEOPLASMS COLON  (ICD-V76.51) MYOCARDIAL INFARCTION, HX OF (ICD-412) HYPERLIPIDEMIA (ICD-272.4) CORONARY ARTERY DISEASE (ICD-414.00)  Past Surgical History: Last updated: 02/26/2009 Coronary artery bypass graft Aicd  Medtronic Maximo [id number]  12/10/2003  Duke Salvia, M.D.   Revision of atrial lead which had dislodged.  Doylene Canning. Ladona Ridgel, M.D.     12/11/2003     CARDIAC CATHETERIZATION  x 6 2005 (4),2006,2010  Review of Systems       The patient complains of dyspnea on exertion.  The patient denies chest pain, syncope, and peripheral edema.    Vital Signs:  Patient profile:   55 year old male Height:      68.5 inches Weight:      247 pounds BMI:     37.14 Cuff size:   regular  Vitals Entered By: Hardin Negus, RMA (October 06, 2009 4:27 PM)  Physical Exam  General:  Well developed, well nourished, in no acute distress. Head:  normocephalic and atraumatic Eyes:  PERRLA/EOM intact; conjunctiva and lids normal. Mouth:  Teeth, gums and palate normal. Oral mucosa normal.    ICD Specifications Following MD:  Lewayne Bunting, MD     Referring MD:  LITTLE ICD Vendor:  Medtronic     ICD Model Number:  [id number]     ICD Serial Number:  [id number] ICD DOI:  03/20/2009     ICD Implanting MD:  Lewayne Bunting, MD  Lead 1:    Location: RA     DOI: 12/10/2003     Model #: 4403     Serial #: [id number]  Status: active Lead 2:    Location: RV     DOI: 12/10/2003     Model #: 9629     Serial #: [id number]     Status: active Lead 3:    Location: RV     DOI: 03/20/2009     Model #: 0102     Serial #: [id number]     Status: active Lead 4:    Location: LV     DOI: 03/20/2009     Model #: 4196     Serial #: [id number]     Status: active  Indications::  MONOMORPHIC VT, ICM  Explantation Comments: 03/20/2009 Maximo [id number] H explanted  ICD Follow Up ICD Dependent:  No      Episodes Coumadin:  No  Brady Parameters Mode DDD     Lower Rate Limit:  50     Upper Rate Limit 130 PAV 130     Sensed AV Delay:   120  Tachy Zones VF:  222     VT:  171     VT1:  133 (MONITOR)     MD Comments:  Agree with above.  Impression & Recommendations:  Problem # 1:  AUTOMATIC IMPLANTABLE CARDIAC DEFIBRILLATOR SITU (ICD-V45.02) His device is working normally.  Will recheck in several months.  Problem # 2:  ACUTE ON CHRONIC SYSTOLIC HEART FAILURE (ICD-428.23) His CHF is well controlled with his LVAD.  He will continue his low sodium diet. His updated medication list for this problem includes:    Furosemide 40 Mg Tabs (Furosemide) .[name]..[name] Two times a day    Pacerone 200 Mg Tabs (Amiodarone hcl) .[name]... 2 tabs once daily    Coumadin 1 Mg Tabs (Warfarin sodium) .[name]... As directed    Warfarin Sodium 5 Mg Tabs (Warfarin sodium) .[name]... As directed    Metolazone 2.5 Mg Tabs (Metolazone) .[name]... Take one tablet by mouth on wednesdays.  Problem # 3:  VENTRICULAR TACHYCARDIA (ICD-427.1) His VT has been well controlled on low dose amiodarone.  He is now on 200 mg daily. His updated medication list for this problem includes:    Pacerone 200 Mg Tabs (Amiodarone hcl) .[name]... 2 tabs once daily    Coumadin 1 Mg Tabs (Warfarin sodium) .[name]... As directed    Warfarin Sodium 5 Mg Tabs (Warfarin sodium) .[name]... As directed  Patient Instructions: 1)  Your physician recommends that you schedule a follow-up appointment in: 6 months with Dr Ladona Ridgel

## 2010-10-06 NOTE — Letter (Signed)
Summary: [facility] at [facility]   Imported By: Maryln Gottron 03/17/2010 14:05:33  _____________________________________________________________________  External Attachment:    Type:   Image     Comment:   External Document

## 2010-10-06 NOTE — Letter (Signed)
Summary: [facility]-Cardio Thoracic Surgery  [facility] Healthcare-Cardio Thoracic Surgery   Imported By: Maryln Gottron 06/02/2010 15:17:19  _____________________________________________________________________  External Attachment:    Type:   Image     Comment:   External Document

## 2010-10-06 NOTE — Cardiovascular Report (Signed)
Summary: ICD Industry Check   ICD Industry Check   Imported By: Roderic Ovens 12/25/2009 10:38:58  _____________________________________________________________________  External Attachment:    Type:   Image     Comment:   External Document

## 2010-10-06 NOTE — Letter (Signed)
Summary: Application for Handicapped Placard  Application for Handicapped Placard   Imported By: Maryln Gottron 09/10/2009 12:56:50  _____________________________________________________________________  External Attachment:    Type:   Image     Comment:   External Document

## 2010-10-06 NOTE — Letter (Signed)
Summary: [facility] Surgery  [facility] Thoracic Surgery   Imported By: Maryln Gottron 11/18/2009 14:26:56  _____________________________________________________________________  External Attachment:    Type:   Image     Comment:   External Document

## 2010-10-08 NOTE — Letter (Signed)
Summary: [facility] -Cardiology  [facility] -Cardiology   Imported By: Maryln Gottron 08/20/2010 12:40:41  _____________________________________________________________________  External Attachment:    Type:   Image     Comment:   External Document

## 2010-10-08 NOTE — Letter (Signed)
Summary: Device-Delinquent Check  [facility], Main Office  [address]   [place], [place] [address]   Phone: [phone]  Fax: [phone]     September 03, 2010 MRN: [id number]   [name] [address] [place], [place]  [address]   Dear Mr. BOEKE,  According to our records, you have not had your implanted device checked in the recommended period of time.  We are unable to determine appropriate device function without checking your device on a regular basis.  Please call our office to schedule an appointment as soon as possible.  If you are having your device checked by another physician, please call us so that we may update our records.  Thank you,  Letta Moynahan, EMT  September 03, 2010 12:10 PM  [facility]

## 2010-10-14 NOTE — Cardiovascular Report (Signed)
Summary: Office Visit   Office Visit   Imported By: Roderic Ovens 10/07/2010 15:50:17  _____________________________________________________________________  External Attachment:    Type:   Image     Comment:   External Document

## 2010-10-14 NOTE — Consult Note (Signed)
Summary: [facility]-Cardiology  [facility]Cardiology   Imported By: Maryln Gottron 10/06/2010 13:02:51  _____________________________________________________________________  External Attachment:    Type:   Image     Comment:   External Document

## 2010-10-14 NOTE — Procedures (Signed)
Summary: [email or web address]   Current Medications (verified): 1)  Furosemide 40 Mg Tabs (Furosemide) .... Take 1 Tablet By Mouth Once A Day 2)  Fish Oil 1000 Mg  Caps (Omega-3 Fatty Acids) .... 3 By Mouth Once Daily 3)  Coumadin 1 Mg Tabs (Warfarin Sodium) .... As Directed 4)  Warfarin Sodium 5 Mg Tabs (Warfarin Sodium) .... As Directed 5)  Omeprazole 40 Mg Cpdr (Omeprazole) .... Once Daily 6)  Vitamin D 1000 Unit  Tabs (Cholecalciferol) .... 2 Tabs Once Daily 7)  Lipitor 80 Mg Tabs (Atorvastatin Calcium) .... Once Daily 8)  Enalapril Maleate 10 Mg Tabs (Enalapril Maleate) .... Take One Tablet By Mouth Twice A Day 9)  Ferro-Bob 325 (65 Fe) Mg Tabs (Ferrous Sulfate) .... Take 1 Tablet By Mouth Two Times A Day 10)  Coreg 3.125 Mg Tabs (Carvedilol) .... Take 1 Tablet By Mouth Two Times A Day  Allergies (verified): No Known Drug Allergies   ICD Specifications Following MD:  Lewayne Bunting, MD     Referring MD:  LITTLE ICD Vendor:  Medtronic     ICD Model Number:  [id number]     ICD Serial Number:  [id number] ICD DOI:  03/20/2009     ICD Implanting MD:  Lewayne Bunting, MD Research Study Name aCRT  Lead 1:    Location: RA     DOI: 12/10/2003     Model #: 0865     Serial #: [id number]     Status: active Lead 2:    Location: RV     DOI: 12/10/2003     Model #: 2841     Serial #: [id number]     Status: active Lead 3:    Location: RV     DOI: 03/20/2009     Model #: 2536     Serial #: [id number]     Status: active Lead 4:    Location: LV     DOI: 03/20/2009     Model #: 9563     Serial #: [id number]     Status: active  Indications::  MONOMORPHIC VT, ICM  Explantation Comments: 03/20/2009 Maximo [id number] H explanted  ICD Follow Up Battery Voltage:  3.10 V     Charge Time:  9.6 seconds     Underlying rhythm:  A-SR V-PACED ICD Dependent:  No       ICD Device Measurements Atrium:  Amplitude: 1.4 mV, Impedance: 494 ohms, Threshold: 0.75 V at 0.40 msec Right Ventricle:  Amplitude: PACED mV, Impedance: 418  ohms, Threshold: 0.75 V at 0.40 msec Left Ventricle:  Impedance: 418 ohms, Threshold: 1.25 V at 0.40 msec Shock Impedance: 49/63 ohms   Episodes MS Episodes:  0     Coumadin:  No Shock:  0     ATP:  0     Nonsustained:  0     Atrial Therapies:  0 Atrial Pacing:  1.1%     Ventricular Pacing:  98.4%  Brady Parameters Mode DDD     Lower Rate Limit:  50     Upper Rate Limit 130 PAV 130     Sensed AV Delay:  120  Tachy Zones VF:  222     VT:  171     VT1:  OFF     Next Remote Date:  01/07/2011     Next Cardiology Appt Due:  03/08/2011 Tech Comments:  RESEARCH CHECK--CHECKED BY INDUSTRY.  NORMAL DEVICE FUNCTION.  NO EPISODES SINCE LAST CHECK.  NO CHANGES MADE.  CARELINK 01-07-11 AND ROV  IN 6 MTHS W/GT. Vella Kohler  October 05, 2010 10:05 AM

## 2010-12-07 LAB — GLUCOSE, CAPILLARY: Glucose-Capillary: 98 mg/dL (ref 70–99)

## 2010-12-08 LAB — GLUCOSE, CAPILLARY
Glucose-Capillary: 105 mg/dL — ABNORMAL HIGH (ref 70–99)
Glucose-Capillary: 110 mg/dL — ABNORMAL HIGH (ref 70–99)
Glucose-Capillary: 121 mg/dL — ABNORMAL HIGH (ref 70–99)

## 2010-12-12 LAB — GLUCOSE, CAPILLARY
Glucose-Capillary: 137 mg/dL — ABNORMAL HIGH (ref 70–99)
Glucose-Capillary: 98 mg/dL (ref 70–99)
Glucose-Capillary: 101 mg/dL — ABNORMAL HIGH (ref 70–99)
Glucose-Capillary: 115 mg/dL — ABNORMAL HIGH (ref 70–99)
Glucose-Capillary: 118 mg/dL — ABNORMAL HIGH (ref 70–99)
Glucose-Capillary: 123 mg/dL — ABNORMAL HIGH (ref 70–99)
Glucose-Capillary: 126 mg/dL — ABNORMAL HIGH (ref 70–99)
Glucose-Capillary: 138 mg/dL — ABNORMAL HIGH (ref 70–99)
Glucose-Capillary: 151 mg/dL — ABNORMAL HIGH (ref 70–99)
Glucose-Capillary: 162 mg/dL — ABNORMAL HIGH (ref 70–99)
Glucose-Capillary: 59 mg/dL — ABNORMAL LOW (ref 70–99)
Glucose-Capillary: 66 mg/dL — ABNORMAL LOW (ref 70–99)
Glucose-Capillary: 87 mg/dL (ref 70–99)
Glucose-Capillary: 95 mg/dL (ref 70–99)
Glucose-Capillary: 95 mg/dL (ref 70–99)
Glucose-Capillary: 99 mg/dL (ref 70–99)

## 2010-12-12 LAB — COMPREHENSIVE METABOLIC PANEL
AST: 19 U/L (ref 0–37)
AST: 19 U/L (ref 0–37)
Albumin: 3 g/dL — ABNORMAL LOW (ref 3.5–5.2)
Albumin: 3.1 g/dL — ABNORMAL LOW (ref 3.5–5.2)
BUN: 17 mg/dL (ref 6–23)
BUN: 13 mg/dL (ref 6–23)
BUN: 20 mg/dL (ref 6–23)
Calcium: 9.5 mg/dL (ref 8.4–10.5)
Chloride: 96 mEq/L (ref 96–112)
Creatinine, Ser: 1.14 mg/dL (ref 0.4–1.5)
Creatinine, Ser: 1.15 mg/dL (ref 0.4–1.5)
GFR calc Af Amer: >60 mL/min (ref >60)
GFR calc Af Amer: >60 mL/min (ref >60)
Glucose, Bld: 197 mg/dL — ABNORMAL HIGH (ref 70–99)
Potassium: 3.9 mEq/L (ref 3.5–5.1)
Potassium: 3.4 mEq/L — ABNORMAL LOW (ref 3.5–5.1)
Sodium: 137 mEq/L (ref 135–145)
Total Bilirubin: 1.2 mg/dL (ref 0.3–1.2)
Total Protein: 6.6 g/dL (ref 6.0–8.3)
Total Protein: 6.7 g/dL (ref 6.0–8.3)
Total Protein: 7.9 g/dL (ref 6.0–8.3)

## 2010-12-12 LAB — CARDIAC PANEL(CRET KIN+CKTOT+MB+TROPI)
CK, MB: 13.2 ng/mL — ABNORMAL HIGH (ref 0.3–4.0)
CK, MB: 8.8 ng/mL — ABNORMAL HIGH (ref 0.3–4.0)
Relative Index: 6.8 — ABNORMAL HIGH (ref 0.0–2.5)
Relative Index: 9.5 — ABNORMAL HIGH (ref 0.0–2.5)
Total CK: 155 U/L (ref 7–232)
Total CK: 193 U/L (ref 7–232)
Total CK: 325 U/L — ABNORMAL HIGH (ref 7–232)
Troponin I: 10.97 ng/mL (ref 0.00–0.06)

## 2010-12-12 LAB — CBC
HCT: 31.9 % — ABNORMAL LOW (ref 39.0–52.0)
HCT: 31 % — ABNORMAL LOW (ref 39.0–52.0)
HCT: 32.6 % — ABNORMAL LOW (ref 39.0–52.0)
HCT: 35.2 % — ABNORMAL LOW (ref 39.0–52.0)
Hemoglobin: 10 g/dL — ABNORMAL LOW (ref 13.0–17.0)
MCHC: 33.2 g/dL (ref 30.0–36.0)
MCV: 91.4 fL (ref 78.0–100.0)
MCV: 90.8 fL (ref 78.0–100.0)
MCV: 90.9 fL (ref 78.0–100.0)
Platelets: 278 10*3/uL (ref 150–400)
Platelets: 268 10*3/uL (ref 150–400)
Platelets: 269 10*3/uL (ref 150–400)
RBC: 3.29 MIL/uL — ABNORMAL LOW (ref 4.22–5.81)
RBC: 3.59 MIL/uL — ABNORMAL LOW (ref 4.22–5.81)
RDW: 15.8 % — ABNORMAL HIGH (ref 11.5–15.5)
RDW: 15.6 % — ABNORMAL HIGH (ref 11.5–15.5)
RDW: 16 % — ABNORMAL HIGH (ref 11.5–15.5)
WBC: 12.4 10*3/uL — ABNORMAL HIGH (ref 4.0–10.5)
WBC: 11.4 10*3/uL — ABNORMAL HIGH (ref 4.0–10.5)
WBC: 12.7 10*3/uL — ABNORMAL HIGH (ref 4.0–10.5)

## 2010-12-12 LAB — POCT I-STAT 3, VENOUS BLOOD GAS (G3P V)
TCO2: 29 mmol/L (ref 0–100)
pH, Ven: 7.421 — ABNORMAL HIGH (ref 7.250–7.300)

## 2010-12-12 LAB — BASIC METABOLIC PANEL
CO2: 27 mEq/L (ref 19–32)
CO2: 29 mEq/L (ref 19–32)
CO2: 29 mEq/L (ref 19–32)
Calcium: 8.3 mg/dL — ABNORMAL LOW (ref 8.4–10.5)
Calcium: 8.9 mg/dL (ref 8.4–10.5)
Chloride: 100 mEq/L (ref 96–112)
Chloride: 95 mEq/L — ABNORMAL LOW (ref 96–112)
Chloride: 95 mEq/L — ABNORMAL LOW (ref 96–112)
Creatinine, Ser: 1.03 mg/dL (ref 0.4–1.5)
Creatinine, Ser: 1.15 mg/dL (ref 0.4–1.5)
GFR calc Af Amer: >60 mL/min (ref >60)
GFR calc Af Amer: >60 mL/min (ref >60)
GFR calc non Af Amer: >60 mL/min (ref >60)
Glucose, Bld: 125 mg/dL — ABNORMAL HIGH (ref 70–99)
Glucose, Bld: 142 mg/dL — ABNORMAL HIGH (ref 70–99)
Potassium: 2.8 mEq/L — ABNORMAL LOW (ref 3.5–5.1)
Potassium: 3.4 mEq/L — ABNORMAL LOW (ref 3.5–5.1)
Potassium: 3.6 mEq/L (ref 3.5–5.1)
Sodium: 131 mEq/L — ABNORMAL LOW (ref 135–145)
Sodium: 133 mEq/L — ABNORMAL LOW (ref 135–145)

## 2010-12-12 LAB — TROPONIN I: Troponin I: 0.2 ng/mL — ABNORMAL HIGH (ref 0.00–0.06)

## 2010-12-12 LAB — CK TOTAL AND CKMB (NOT AT ARMC)
Relative Index: INVALID (ref 0.0–2.5)
Total CK: 76 U/L (ref 7–232)

## 2010-12-12 LAB — H. PYLORI ANTIBODY, IGG: H Pylori IgG: 0.4 {ISR}

## 2010-12-12 LAB — HEPARIN LEVEL (UNFRACTIONATED)
Heparin Unfractionated: 0.55 IU/mL (ref 0.30–0.70)
Heparin Unfractionated: 0.7 IU/mL (ref 0.30–0.70)

## 2010-12-12 LAB — MAGNESIUM
Magnesium: 2.1 mg/dL (ref 1.5–2.5)
Magnesium: 2.2 mg/dL (ref 1.5–2.5)

## 2010-12-12 LAB — POCT I-STAT 3, ART BLOOD GAS (G3+)
Acid-Base Excess: 1 mmol/L (ref 0.0–2.0)
pH, Arterial: 7.491 — ABNORMAL HIGH (ref 7.350–7.450)
pO2, Arterial: 72 mmHg — ABNORMAL LOW (ref 80.0–100.0)

## 2010-12-12 LAB — BRAIN NATRIURETIC PEPTIDE
Pro B Natriuretic peptide (BNP): 448 pg/mL — ABNORMAL HIGH (ref 0.0–100.0)
Pro B Natriuretic peptide (BNP): 462 pg/mL — ABNORMAL HIGH (ref 0.0–100.0)

## 2010-12-13 LAB — GLUCOSE, CAPILLARY
Glucose-Capillary: 119 mg/dL — ABNORMAL HIGH (ref 70–99)
Glucose-Capillary: 122 mg/dL — ABNORMAL HIGH (ref 70–99)
Glucose-Capillary: 129 mg/dL — ABNORMAL HIGH (ref 70–99)
Glucose-Capillary: 132 mg/dL — ABNORMAL HIGH (ref 70–99)
Glucose-Capillary: 97 mg/dL (ref 70–99)
Glucose-Capillary: 98 mg/dL (ref 70–99)

## 2010-12-13 LAB — BASIC METABOLIC PANEL
BUN: 18 mg/dL (ref 6–23)
CO2: 24 mEq/L (ref 19–32)
Calcium: 9.3 mg/dL (ref 8.4–10.5)
Chloride: 101 mEq/L (ref 96–112)
Creatinine, Ser: 1.13 mg/dL (ref 0.4–1.5)
GFR calc Af Amer: >60 mL/min (ref >60)
Glucose, Bld: 152 mg/dL — ABNORMAL HIGH (ref 70–99)

## 2010-12-14 LAB — GLUCOSE, CAPILLARY
Glucose-Capillary: 117 mg/dL — ABNORMAL HIGH (ref 70–99)
Glucose-Capillary: 108 mg/dL — ABNORMAL HIGH (ref 70–99)
Glucose-Capillary: 86 mg/dL (ref 70–99)
Glucose-Capillary: 89 mg/dL (ref 70–99)
Glucose-Capillary: 90 mg/dL (ref 70–99)
Glucose-Capillary: 91 mg/dL (ref 70–99)
Glucose-Capillary: 94 mg/dL (ref 70–99)

## 2010-12-15 LAB — GLUCOSE, CAPILLARY
Glucose-Capillary: 119 mg/dL — ABNORMAL HIGH (ref 70–99)
Glucose-Capillary: 132 mg/dL — ABNORMAL HIGH (ref 70–99)
Glucose-Capillary: 138 mg/dL — ABNORMAL HIGH (ref 70–99)
Glucose-Capillary: 146 mg/dL — ABNORMAL HIGH (ref 70–99)
Glucose-Capillary: 175 mg/dL — ABNORMAL HIGH (ref 70–99)
Glucose-Capillary: 77 mg/dL (ref 70–99)
Glucose-Capillary: 92 mg/dL (ref 70–99)

## 2010-12-15 LAB — CARDIAC PANEL(CRET KIN+CKTOT+MB+TROPI)
CK, MB: 3.3 ng/mL (ref 0.3–4.0)
CK, MB: 7.8 ng/mL — ABNORMAL HIGH (ref 0.3–4.0)
Relative Index: INVALID (ref 0.0–2.5)
Relative Index: INVALID (ref 0.0–2.5)
Total CK: 157 U/L (ref 7–232)
Total CK: 87 U/L (ref 7–232)
Total CK: 92 U/L (ref 7–232)
Troponin I: 0.32 ng/mL — ABNORMAL HIGH (ref 0.00–0.06)
Troponin I: 1.94 ng/mL (ref 0.00–0.06)

## 2010-12-15 LAB — BASIC METABOLIC PANEL
CO2: 23 mEq/L (ref 19–32)
CO2: 25 mEq/L (ref 19–32)
Calcium: 9.4 mg/dL (ref 8.4–10.5)
Glucose, Bld: 101 mg/dL — ABNORMAL HIGH (ref 70–99)
Glucose, Bld: 125 mg/dL — ABNORMAL HIGH (ref 70–99)
Potassium: 3.8 mEq/L (ref 3.5–5.1)
Sodium: 136 mEq/L (ref 135–145)
Sodium: 138 mEq/L (ref 135–145)

## 2010-12-15 LAB — CBC
HCT: 30.4 % — ABNORMAL LOW (ref 39.0–52.0)
Hemoglobin: 10.3 g/dL — ABNORMAL LOW (ref 13.0–17.0)
Hemoglobin: 10.6 g/dL — ABNORMAL LOW (ref 13.0–17.0)
MCHC: 34.8 g/dL (ref 30.0–36.0)
RBC: 3.18 MIL/uL — ABNORMAL LOW (ref 4.22–5.81)
RDW: 15.3 % (ref 11.5–15.5)

## 2011-01-07 ENCOUNTER — Encounter: Payer: Self-pay | Admitting: *Deleted

## 2011-01-10 ENCOUNTER — Encounter: Payer: Self-pay | Admitting: *Deleted

## 2011-01-19 NOTE — Discharge Summary (Signed)
NAMEDEVRON, COHICK             ACCOUNT NO.:  [id number]   MEDICAL RECORD NO.:  [id number]          PATIENT TYPE:  INP   LOCATION:  2921                         FACILITY:  [facility]   PHYSICIAN:  Thereasa Solo. Little, M.D. DATE OF BIRTH:  07-Mar-1956   DATE OF ADMISSION:  01/16/2009  DATE OF DISCHARGE:  01/18/2009                               DISCHARGE SUMMARY   DISCHARGE DIAGNOSES:  1. Recurrent angina with known coronary artery disease.  2. Progressive coronary artery disease status post cardiac      catheterization this admission with a complex intervention by Dr.      Julieanne Manson.  He had stenting to his left main, to his proximal      OD with 2 Driver stents, one 1.61 x 12 and one 3.0 x 15, it was a      suboptimal result.  Please see Dr. Fredirick Maudlin note for complete      details.  3. History of coronary artery disease with bypass surgery in 1992,      redo bypass surgery in 2002.  4. Ischemic cardiomyopathy with an ejection fraction of less than 20%.  5. Implantable cardioverter-defibrillator with a probable      biventricular implantable cardioverter-defibrillator to be placed      in approximately 6 weeks by Dr. Ladona Ridgel.  6. Chronic left bundle-branch block.  7. Noninsulin-dependent diabetes mellitus.  8. Dyslipidemia.  9. Hypothyroidism.   LABORATORY DATA:  BNP 253.  Sodium 138, potassium 3.8, BUN 18,  creatinine 1.05, chloride 100, CO2 of 25.  Hemoglobin 10.6, hematocrit  30.4, WBCs 10.3, and platelets 252.  Postprocedure CK-MB #1, 157/5.3  with a troponin of 0.32; #2, 87/7.8 with a troponin of 0.90; #3, 92/6.6  with a troponin of 1.94; and #4, 68/3.3 with a troponin of 1.75.   DISCHARGE MEDICATIONS:  There are the same as prior to admission with  the addition of Protonix 40 mg once a day.   HOME MEDICATIONS:  1. Digoxin 0.125 mg everyday.  2. Levoxyl 25 mcg everyday.  3. Niaspan 500 mg everyday.  4. Plavix 75 mg everyday.  5. Coreg 25 mg a half 2 times a day.  6.  Potassium 20 mEq 2 times a day.  7. Lasix 80 mg 2 everyday.  8. Nitroglycerin 0.4 mg as needed.  9. Aspirin 325 mg a day.  10.Aldactone 25 mg a day.  11.Enalapril 20 mg a day.  12.Crestor 20 mg a day.  13.Amaryl 4 mg everyday.  14.Metformin 500 mg b.i.d.  15.Imdur 30 mg everyday.   OTHER DISCHARGE INSTRUCTIONS:  If he has any problems with his groin, he  will call.  He should do no strenuous activity, lifting, pushing or  pulling or extended walking for a week.  He should not drive for a day.  If he is planning to attend cardiac rehab, he will follow up with Dr.  Clarene Duke in 1-2 weeks.  Our office will call him with an appointment.   HOSPITAL COURSE:  Mr. Swoboda was seen in the office by Dr. Clarene Duke.  He  was having recurrent angina.  It was  decided that he should undergo  revascularization.  The plan was for him to have an upgrade to a BiV  device in the future.  Cardiac catheterization was performed by Dr.  Clarene Duke, please see his note for complete details.  His left main  terminal was 80% with distal third tapering to 80%, circ was 100%  ostial, LAD was 100% proximal, OD was very proximal 70% and proximal  60%, distal vessel 2.5 mm, RCA was 100% with late collateral flow and  distal of distal RCA.  He had a large collateral bed to RCA, OM, and  diag.  Graft free radial to distal LAD was okay with collaterals to OMs  and diagonal, SVG to RCA was 100%, SVG to OM was 100%, LIMA to his LAD  was okay.  LV was markedly dilated with an EF was less than 15%.  His  EDP was 31.  He went on to our undergo a complex procedure including a  PCI and stenting from his left main into his OD.  This was a difficult  procedure with suboptimal result.  Please see Dr. Fredirick Maudlin note for  complete details.  Postprocedure, his enzymes slightly bumped.  He was  kept an extra day and they did come back down.  He had no further chest  pain.  He was seen by Cardiac Rehab.  They noticed that he was   deconditioned.  He was interested in going into cardiac rehab phase II  which is planned.  He was seen by Dr. Tresa Endo on Jan 18, 2009.  His blood  pressure was 107/59, heart rate was 78, respirations were 18.  Dr. Tresa Endo  thought that he may have obstructive sleep apnea, and he thought that he  should be considered for a sleep study as an outpatient.      Lezlie Octave, N.P.    ______________________________  Thereasa Solo. Little, M.D.    BB/MEDQ  D:  01/18/2009  T:  01/19/2009  Job:  [id number]

## 2011-01-19 NOTE — Cardiovascular Report (Signed)
NAMEEBEN, CHOINSKI             ACCOUNT NO.:  [id number]   MEDICAL RECORD NO.:  [id number]          PATIENT TYPE:  INP   LOCATION:  2917                         FACILITY:  [facility]   PHYSICIAN:  Thereasa Solo. Little, M.D. DATE OF BIRTH:  1955-09-12   DATE OF PROCEDURE:  03/16/2009  DATE OF DISCHARGE:                            CARDIAC CATHETERIZATION   This 55 year old male is admitted for cardiac catheterization.  His past  history includes bypass surgery in 1992, redo surgery in 2002 and at  that time he barely had enough conduit for revascularization.  He was  not felt to be a candidate for right third open heart procedure.  He has  had progressive problems with shortness of breath, congestive failure,  and chest pain.  His chest pain is more for an epigastric discomfort  that sounds GI with some burping and belching.  He takes nitroglycerin,  it gets better.  He was scheduled for an endoscopy, but because of the  shortness of breath this was cancelled and he is brought in for a heart  catheterization to make sure his coronary anatomy is in fact stable and  then an endoscopy.   PROCEDURE:  The patient was prepped and draped in the usual sterile  fashion exposing the right groin.  Following local anesthetic with 1%  Xylocaine, the Seldinger technique was employed, a Smart needle was  used, and a 5-French introducer sheath was placed in the right femoral  artery.  Left and right coronary arteriography, graft visualization, and  hemodynamic monitoring within the left ventricular cavity was performed.  The the procedure was done with the patient at a 30+ degree angle lying  on a wedge with 2 liters of oxygen, markedly short of breath and  coughing, complaining of chest pain.  He was started on IV  nitroglycerin, given 80 mg of Lasix.  After the diagnostic cath was  performed, decision was made to proceed on with a right heart cath and  at that point a 7-French introducer sheath was placed  in the right  femoral vein and a Swan-Ganz catheter was advanced through the pulmonary  artery with hemodynamic monitoring undertaken throughout each station.  Cardiac output and oxygen saturations in the PA and AO were obtained.   At the end of the procedure, the patient was transferred in stable  condition to the recovery room.  He had already put out over a liter of  urine.   RESULTS:  He had a 50% ejection fraction at his cath on March 2010.  Because of the congestive failure, I did not repeat the LV gram.   TOTAL CONTRAST USED:  75 mL.   HEMODYNAMIC MONITORING:  This was performed at the end of the diagnostic  cath.  1. Right atrial pressure 21.  Right ventricular pressure 70/20.      Pulmonary artery pressure 79/31, wedge was 49.  LV pressure was      116/41.  Central aortic pressure was 110/80.  Oxygen saturations      were obtained with the patient on 2 liters.  His AO sats were 96%.  His PA sats were 85%.   Cardiac output and index by thermodilution were 2.94 and 1.26  respectively.  Cardiac output and index by Fick 4.1 and 1.78  respectively.   CORONARY ARTERIOGRAPHY:  His left main had terminal 30% narrowing.  There was a stent in the terminal left main that extended into the  optional diagonal.  This was placed in March 2010.  The mid stent was  hazy and functionally occluded.  There was trivial visualization of  diffusely diseased OM system, optional diagonal, and LAD.  There was  clearly nothing large enough to go after.  The stent was placed in March  2010 with no other options for improvement of his symptoms.   1. Right coronary artery:  100% occluded proximally.  There is faint      collateral visualization of the distal right from the RV branch.  2. Saphenous vein graft to the OM:  100% occluded and was occluded on      previous cath.  3. Saphenous vein graft to the RCA:  100% occluded and was occluded on      previous caths.  4. Saphenous vein graft  (questionable free radial) to distal LAD:  The      graft was patent.  The distal LAD was adequately visualized and      there were collaterals to an OM and a PDA bed.  5. Internal mammary artery to the mid LAD:  The mid LAD was small and      diffusely diseased.   CONCLUSION:  1. 15% ejection fraction by catheterization in March 2010.  2. Occlusion of stent in the left main that supplied a non-grafted      optional diagonal.  This system is functionally occluded now.  3. Loss of the vein graft to the obtuse marginal and right coronary      artery in the remote past.  4. Patent left internal mammary artery to a diffusely diseased mid      left anterior descending and a free radial graft to the distal left      anterior descending that is widely patent.  5. Severe pulmonary hypertension with pulmonary artery pressure of      79/31.   The patient is being admitted for aggressive medical therapy for his  failure.  I plan to refer him for consideration for cardiac transplant  if he stabilizes.  Because of the marked pulmonary pressure elevations,  I will ask our pulmonary physicians to evaluate him regarding medical  therapy for this.   The overall outlook is quite bad.  There is no conduit for a third redo  bypass surgery and there are no distal targets.   He will still need endoscopy to rule out distal esophageal and GI  issues, but he needs to be considerably more stable than he is today.           ______________________________  Thereasa Solo. Little, M.D.     ABL/MEDQ  D:  04/16/2009  T:  04/16/2009  Job:  [id number]   cc:   Valetta Mole. Cato Mulligan, MD  Teena Irani. Arlyce Dice, M.D.

## 2011-01-19 NOTE — Discharge Summary (Signed)
[name], KULISH             ACCOUNT NO.:  [id number]   MEDICAL RECORD NO.:  [id number]          PATIENT TYPE:  INP   LOCATION:  2917                         FACILITY:  [facility]   PHYSICIAN:  Thereasa Solo. Little, M.D. DATE OF BIRTH:  08/23/1956   DATE OF ADMISSION:  04/16/2009  DATE OF DISCHARGE:                               DISCHARGE SUMMARY   ADDENDUM:   PRESUMED DATE OF DISCHARGE:  April 21, 2009.     Please note previous discharge summary.  Plans will be to transfer to  [facility] April 21, 2009 or April 22, 2009.   DISCHARGE CHANGES:  Medication list has been adjusted since last  discharge summary.  1. Nitroglycerin drip at 5 mcg per minute.  2. IV dobutamine at 5 mcg/kg per minute.  3. Insulin sliding scale.  4. Lanoxin 0.125 mg p.o. daily.  5. Levothyroxine 25 mcg daily.  6. Niaspan 500 mg every evening.  7. Carvedilol 12.5 mg twice a day.  8. Aspirin 325 mg daily.  9. Spironolactone 25 mg daily.  10.Vasotec 20 mg daily.  11.Crestor 20 mg daily.  12.Amaryl 4 mg every morning.  13.Previously given isosorbide mononitrate 30 mg daily, currently on      hold while on IV nitroglycerin.  14.Hyoscyamine sulfate 0.375 mg every 12 hours.  15.Protonix 40 mg twice a day.  16.Potassium chloride 20 mEq twice a day.  17.Lovenox 120 mg every 12 hours.  18.Lasix 80 mg IV every 8 hours.  19.Amiodarone 400 mg twice a day.  20.Colace 100 mg twice a day.  21.Morphine 2 mg IV every 1 hour p.r.n.  22.Atrovent 0.5 mg per 2.5 mL neb solution every 6 hours p.r.n.   The patient has been up in the room without complications.  Episodically, he has had chest pain, treated with IV nitroglycerin which  has improved his pain.   Please note that the patient's Accu-Cheks during hospitalization have  been as high in the last several days up to 151, as low as 66.   LABORATORY VALUES:  BNP at discharge was 506.  Comprehensive metabolic  panel:  Sodium 129, potassium 3.9, chloride 98, CO2  23, glucose 106, BUN  17, creatinine 1.14, total bilirubin 1.4, alkaline phos 62, SGOT 19,  SGPT 22, total protein 6.6, albumin 3.0, calcium 8.8.   Hemoglobin 10.5, hematocrit 31.9, WBC 12.4, platelets 278.   Most recent chest x-ray is done on April 20, 2009, improved  ventilation, mild atelectasis, stable cardiomegaly and mediastinal  contours.   The patient is stable otherwise and ready for transfer.      Darcella Gasman. Ingold, N.P.    ______________________________  Thereasa Solo Little, M.D.    LRI/MEDQ  D:  04/21/2009  T:  04/21/2009  Job:  [id number]

## 2011-01-19 NOTE — Letter (Signed)
March 27, 2007    Thereasa Solo. Little, M.D.  [address], [address]  [place], [place] [address]   RE:  Kevin, Bush  MRN:  [id number]  /  DOB:  05/09/1956   Dear [name]:   [name] comes in today.  He is assessed for his ICD implantation for  ventricular tachycardia in the setting of ischemic heart disease.  He is  doing well.  He is now qualified for disability.  He continues to work  at [employer].  His weight is up 10 pounds.   His medications are reviewed.  He is not all together up to date with  them, but he says he is taking lisinopril now and Coreg 25.  He is also  on Plavix, and he has a drug card which allows him to afford him  medicines.   PHYSICAL EXAMINATION:  VITAL SIGNS:  Blood pressure 118/70, pulse 72.  Weight 272.  LUNGS:  Clear.  HEART:  Heart sounds were regular.  EXTREMITIES:  Without edema.   Interrogation of his Medtronic Maximo device demonstrates a P wave of  1.6 with an impedance of 432, a threshold of 1 volt at 0.2.  The R wave  was 5.5 with impedance of 440, threshold of 1 volt at 0.3.   IMPRESSION:  1. Ventricular tachycardia.  2. Status post implantable cardioverter-defibrillator for the above.  3. Ischemic heart disease.  4. Obesity.   [name] is stable.  We will plan to see him again in one years time,  and I will follow him remotely in the interim.    Sincerely,      Duke Salvia, MD, [facility]  Electronically Signed    SCK/MedQ  DD: 03/27/2007  DT: 03/27/2007  Job #: [id number]

## 2011-01-19 NOTE — Op Note (Signed)
NAMEHENNING, [name]             ACCOUNT NO.:  [id number]   MEDICAL RECORD NO.:  [id number]          PATIENT TYPE:  INP   LOCATION:  2507                         FACILITY:  [facility]   PHYSICIAN:  Doylene Canning. Ladona Ridgel, MD    DATE OF BIRTH:  [birth year]   DATE OF PROCEDURE:  03/20/2009  DATE OF DISCHARGE:                               OPERATIVE REPORT   PROCEDURE PERFORMED:  Insertion of a new right ventricular pacing rate  sensed lead, insertion of a new left ventricular pacing lead with  removal of a previous dual-chamber defibrillator, insertion of new  biventricular defibrillator with defibrillation threshold testing and  contrast venography of the coronary sinus.   INTRODUCTION:  The patient is a 55 year old man with a longstanding  ischemic cardiomyopathy.  He is status post bypass surgery.  Two months  ago, he underwent stenting of his left main coronary artery.  The  patient's heart failure has remained class III.  He has an  intraventricular conduction delay and a QRS duration of 155 milliseconds  and marked first-degree AV block and he is now referred for upgrade to a  biventricular ICD.   PROCEDURE:  After informed consent was obtained, the patient was taken  to the diagnostic EP lab in the fasting state.  After usual preparation  and draping, intravenous fentanyl and midazolam were given for sedation.  A 30 mL of lidocaine was infiltrated over the old ICD insertion site.  An 8-cm incision was carried out over this region.  Electrocautery was  utilized to dissect down to the fascial plane.  Care was taken not to  enter the prior ICD pocket.  The left subclavian vein was subsequently  punctured x2.  The vein itself was quite stenosed and to slick wires  were utilized and advanced into the central circulation.  After dilating  of the vein was accomplished successfully, the Medtronic model 5076 58-  cm active fixation RV pacing lead, serial number [id number] was advanced  into the  right ventricle.  Mapping was made more difficult by the very  massive cardiomegaly that the patient had.  At the final site on the RV  septum, the R-waves with lead actively fixed for 14.  The pacing  impedance was 1000 ohms and the threshold was about 1 volt at 0.5  milliseconds.  A large injury current was observed with active fixation  of the lead and 10-volt pacing did not stimulate the diaphragm.  With  the right ventricular lead in satisfactory position, attention was then  turned to placement of the left ventricular lead.  A large hook coronary  sinus guiding catheter was utilized along with a 6-French EP catheter to  cannulate the coronary sinus, was much more difficult than normal.  Venography of the coronary sinus was carried out.  This demonstrated a  very posterior vein not far from the middle cardiac vein, which was  fairly large and have three branches, but branches occurring more  distally towards the apex.  There was a very small lateral vein which  was of questionable sized to accommodate an LV pacing lead and there  was  a small high lateral vein.  It was deemed most appropriate to attempt to  place the LV lead in the lateral vein and utilizing the Attain II  subselective catheter, the lateral vein was cannulated and a Luge wire  was utilized and advanced out into the lateral vein.  The 90-degrees  Attain II was advanced along with its dilator over the Luge wire into  the lateral vein.  A Mailman 0.014 angioplasty wire was advanced over  the Attain II subselective catheter out into the vein.  The dilator was  removed and the Medtronic Attain Ability model 4196 80-cm passive  fixation LV pacing lead, serial number [id number] was advanced into the  lateral vein of the left ventricle.  The lead traversed through the vein  approximately one third from base to apex.  In this location, the  diaphragm was not stimulated with 10-volt pacing and the threshold was  around 2 volts at  0.5 milliseconds.  With with these satisfactory  parameters, the lead was liberated from the guiding catheters without  particular difficulty.  Lead position appeared to be fairly stable.  As  such, the LV and RV leads were secured to the subpectoralis fascia with  a figure-of-eight silk suture.  Sewing sleeve was also secured with silk  suture.  At this point, the pocket was irrigated.  Electrocautery was  then utilized to enter the old ICD pocket and the dual-chamber Medtronic  defibrillator was removed.  The new Medtronic Concerto II BiV ICD,  serial number [id number] was connected to the old right atrial new rate  sense RV and new LV leads along with the old [id number] coils.  The old [phone]  rate sense portion of the lead was capped.  The pocket was then  irrigated with gentamicin and the device was placed back into the  subcutaneous pocket after the pocket had been revised to accommodate the  new leads and a larger defibrillator.  Defibrillation threshold testing  was then carried out.   After the patient was more deeply sedated with fentanyl and Versed, VF  was induced with a T-wave shock and a 20-joule shock was delivered,  which terminated VF and restored sinus rhythm.  At this point, no  additional defibrillation threshold testing was carried out and the  incision was closed with 2-0 and 3-0 Vicryl.  Benzoin was painted on the  skin and Steri-Strips were applied and pressure dressing was placed and  the patient was returned to his room in satisfactory condition.   COMPLICATIONS:  There were no immediate procedure complications.   RESULTS:  This demonstrate successful BiV ICD upgrade in the patient  with a worsening class III heart failure, EF 20%, and intraventricular  conduction delay, QRS duration of 155 milliseconds.      Doylene Canning. Ladona Ridgel, MD  Electronically Signed     GWT/MEDQ  D:  03/20/2009  T:  03/20/2009  Job:  [id number]   cc:   Thereasa Solo. Little, M.D.  Bruce Rexene Edison Swords,  MD

## 2011-01-19 NOTE — Assessment & Plan Note (Signed)
[facility]                         GASTROENTEROLOGY OFFICE NOTE   [name], [name]                    MRN:          [id number]  DATE:09/20/2007                            DOB:          08-13-1956    PROBLEM:  Abdominal pain.   [name] has returned complaining of postprandial abdominal pain.  Whether he drinks water or eats, he has developed immediate postprandial  abdominal discomfort, described as a pressure-like pain throughout his  upper abdomen.  It is without radiation.  He denies nausea or pyrosis.  He has been taking over-the-counter Prilosec without much improvement.  He is also taking Gas-X.  He has taken nitroglycerin without relief as  well.  He does note that the pain is improved after a large belch.   MEDICATIONS:  Digoxin.  Carvedilol.  Plavix.  Niaspan.  Lasix.  Levothyroxine.  Spironolactone.  Crestor.  Potassium.  Enalapril.   EXAM:  Pulse 80, blood pressure 120/72, weight 278.  HEENT:  EOMI. PERRLA. Sclerae are anicteric.  Conjunctivae are pink.  NECK:  Supple without thyromegaly, adenopathy or carotid bruits.  CHEST:  Clear to auscultation and percussion without adventitious  sounds.  CARDIAC:  Regular rhythm; normal S1 S2.  There are no murmurs, gallops  or rubs.  ABDOMEN:  He has a reducible umbilical hernia.  Abdomen is obese, but  without masses, tenderness, or organomegaly.  EXTREMITIES:  Full range of motion.  No cyanosis, clubbing or edema.  RECTAL:  There are no masses.  Stool is Hemoccult negative.   IMPRESSION:  Postprandial abdominal pain.  This could be due to  ulcerative or non-ulcerative dyspepsia.  Chronic cholecystitis is also a  consideration, albeit less likely.   RECOMMENDATION:  1. Upper endoscopy.  2. Trial of Protonix.  3. Abdominal ultrasound if the above are not diagnostic and he is not      improved.     Barbette Hair. Arlyce Dice, MD,FACG  Electronically Signed    RDK/MedQ  DD: 09/20/2007   DT: 09/20/2007  Job #: [id number]   cc:   Thereasa Solo. Little, M.D.  Doylene Canning. Ladona Ridgel, MD

## 2011-01-19 NOTE — Assessment & Plan Note (Signed)
[facility]                         GASTROENTEROLOGY OFFICE NOTE   [name], [name]                    MRN:          [id number]  DATE:10/26/2007                            DOB:          1956/07/12    PROBLEM:  Abdominal discomfort.   Mr. Heatwole has returned following upper endoscopy.  This demonstrated a  nonspecific gastritis and duodenitis.  On Protonix, he has noted marked  improvement in his pain.  At this point he only complains of some  tightness in his upper abdomen upon recumbency.  He is without pain per  se.   PHYSICAL EXAMINATION:  VITAL SIGNS:  Pulse 80, blood pressure 116/72,  weight 274.  ABDOMEN:  The abdomen is protuberant due to obesity.  He has a reducible  umbilical hernia.  There is no flank ascites, masses, or organomegaly.   IMPRESSION:  1. Dyspepsia resolved with proton pump inhibitor therapy, secondary to      gastritis and duodenitis.  2. Abdominal tightness.  This is probably due to obesity.  Ascites is      hard to detect in this obese patient.   RECOMMENDATION:  Abdominal ultrasound.  If this does not demonstrate  ascites, I would not pursue a GI workup any further.     Barbette Hair. Arlyce Dice, MD,FACG  Electronically Signed    RDK/MedQ  DD: 10/26/2007  DT: 10/26/2007  Job #: [id number]   cc:   Thereasa Solo. Little, M.D.  Doylene Canning. Ladona Ridgel, MD

## 2011-01-19 NOTE — Discharge Summary (Signed)
[name], [name]             ACCOUNT NO.:  [id number]   MEDICAL RECORD NO.:  [id number]          PATIENT TYPE:  INP   LOCATION:  2507                         FACILITY:  [facility]   PHYSICIAN:  Doylene Canning. Ladona Ridgel, MD    DATE OF BIRTH:  11-24-1955   DATE OF ADMISSION:  03/20/2009  DATE OF DISCHARGE:  03/21/2009                               DISCHARGE SUMMARY   This patient has no known drug allergies.   FINAL DIAGNOSES:  1. Discharging day #1, status post implant of a Medtronic Concerto CRT-      D system with cardiac resynchronization lead.      a.     New device implanted for ischemic cardiomyopathy, high-grade       New York Heart Association class of congestive heart failure.       History of monomorphic ventricular tachycardia.   SECONDARY DIAGNOSES:  1. History of multiple myocardial infarctions (first was 57 when the      patient was 55 year old).  2. Ischemic cardiomyopathy, ejection fraction less than 15% at recent      catheterization, Jan 16, 2009.  3. Three-vessel coronary artery disease.      a.     Coronary artery bypass graft surgery in 1992, redo coronary       artery bypass graft in 2000 (Dr. Donata Clay).  4. Recent catheterization, Jan 16, 2009, for progressive exertional      angina.      a.     At catheterization, a stent was placed in the diagonal; also       a stent to the distal 80% left main stenosis.      b.     Grafts placed in 1992 as observed at catheterization.       Saphenous vein graft to the right coronary artery is totally       occluded.  A sequential saphenous vein graft to first obtuse       marginal and the second obtuse marginal was totally occluded.       This is a new finding since 2006.  The left internal mammary       artery to the diagonal was patent.      c.     Grafts place in 2000 as reviewed at catheterization, Jan 16, 2009.  The free radial to the distal left anterior descending is       patent.  A second saphenous vein graft  to the obtuse marginal is       also occluded.  The patient had a stenting to this graft in both       April 2005 and November 2005; the second saphenous vein graft to       the right coronary artery was also totally occluded.  5. A Medtronic Maximo dual-chamber cardioverter-defibrillator      implanted December 10, 2003 for monomorphic ventricular      tachycardia/ischemic cardiomyopathy with prior myocardial      infarctions/congestive heart failure.  6. 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  7. Diabetes.  8. Dyslipidemia.  9. Hypothyroidism.  10.Obesity.   PROCEDURE:  March 20, 2009, implant of a Medtronic Concerto CRT-D system  representing a biventricular implantable cardioverter-defibrillator  upgrade.  The patient will discharge on 5 days of oral antibiotics, Dr.  Lewayne Bunting.  Defibrillator threshold study less than or equal to 20  joules.  A new right ventricular rate sense lead was also implanted.   BRIEF HISTORY:  Mr. Nohr is a 55 year old male.  He has a long  history of coronary artery disease.  His first heart attack was in 1992.  He has had bypass surgery twice.  He has had stents placed in his  saphenous vein grafts after his second coronary artery bypass.   The patient has gradually developed worsening left ventricular  dysfunction with prolongation of the QRS interval.  His ejection  fraction is less than 15%.  He has class III congestive heart failure.  This is despite invasive procedure resulting in stent to the diagonal  and stent to the stenosis in the left main several weeks ago.  His QRS  is 150 milliseconds with an interventricular conduction delay pattern.  He is now referred for additional evaluation.   Mr. Goree is well compensated at present, although he still has class  III congestive heart failure symptoms.  The risks, benefits, goals, and  expectations of BiV ICD upgrade have been discussed with the  patient and  he wishes to proceed.   HOSPITAL COURSE:  The patient presents electively on March 20, 2009.  He  underwent implantation of the Medtronic Concerto II CRT-D after  explantation of his Medtronic Maximo ICD.  He had defibrillator  threshold study less than or equal to 20 joules.  He has had no  postprocedural complications, no hematoma at the implant site.  The  device has been interrogated.  All values are within normal limits.  The  x-ray shows no pneumothorax and the leads are in appropriate position.  He has been instructed in mobility of his left arm and in incision care.  He was asked not to shower for the next 7 days, to sponge bathe until  Thursday, March 27, 2009.   Medications include a new medication, Keflex 500 mg one tablet one half  hour before breakfast, lunch, dinner, bedtime for next 5 days.  He is  also asked to hold off on taking metformin 500 mg twice daily until  Sunday, March 23, 2009.  He is to continue the following medications:  1. Levoxyl 25 mcg daily.  2. Plavix 75 mg daily.  3. Enteric-coated aspirin 325 mg daily.  4. Carvedilol 25 mg one half tablet twice daily.  5. Furosemide 80 mg two tablets daily.  6. Lanoxin 0.125 mg daily.  7. Niaspan 500 mg daily each evening.  8. Spironolactone 25 mg daily.  9. Crestor 20 mg daily at bedtime.  10.Klor-Con 20 mEq two tablets daily.  11.Enalapril 20 mg daily.  12.Glimepiride 4 mg daily.  13.Nitroglycerin 0.4 mg one tablet under the tongue every 5 minutes x3      doses for recurrent chest pain.   FOLLOW UP:  1. He follows up at [facility], [address],      Friday, March 28, 2009 at 7:30.  He has an appointment for research      study and also an incision check.  2. To see Dr. Ladona Ridgel, Tuesday, June 24, 2009 at 2:40 p.m.   Postprocedure  day #1, serum electrolytes; sodium 132, potassium 4.3,  chloride 101, carbonate 24, BUN is 18, creatinine 1.13, glucose is 152.  Other  laboratories pertinent to this admission were drawn on March 13, 2009; white cells 9.1, hemoglobin 10.8, hematocrit 32.1, platelets are  277.  Protime 12.4, INR is 1.2.      Maple Mirza, PA      Doylene Canning. Ladona Ridgel, MD  Electronically Signed    GM/MEDQ  D:  03/21/2009  T:  03/21/2009  Job:  [id number]   cc:   Valetta Mole. Swords, MD  Thereasa Solo. Little, M.D.

## 2011-01-19 NOTE — Discharge Summary (Signed)
NAMESOPHIA, CUBERO             ACCOUNT NO.:  [id number]   MEDICAL RECORD NO.:  [id number]          PATIENT TYPE:  INP   LOCATION:  2917                         FACILITY:  [facility]   PHYSICIAN:  Richard A. Alanda Amass, M.D.DATE OF BIRTH:  [birth year]   DATE OF ADMISSION:  04/16/2009  DATE OF DISCHARGE:                               DISCHARGE SUMMARY   DISCHARGE DIAGNOSES:  1. Non-ST elevation myocardial infarction.  2. Acute pulmonary edema.  3. Severe coronary artery disease.  December 1992, he had an      myocardial infarction.  September 04, 1991, he had coronary artery      bypass grafting x5 with a left internal mammary artery to his left      anterior descending artery and saphenous vein graft to diagonal 1      and saphenous vein graft to his obtuse marginal and distal      circumflex and saphenous vein graft to his distal PDA.  April 23, 1999, he had a coronary artery bypass grafting x3 with redo with a      free left radial to his distal left anterior descending  artery and      saphenous vein graft to his right coronary artery and saphenous      vein graft to his obtuse marginal.  Ejection fraction at that time      was 25%.  December 06, 2003, he had a catheterization.  He had a 90%      saphenous vein graft to his obtuse marginal ostial.  December 10, 2003,      he had a Medtronic Maximo ICD placed for sustained monomorphic      ventricular tachycardia due to the dual chamber.  On December 16, 2003, he had a Cypher 3.0 x 28 mm to his mid saphenous vein graft      to his obtuse marginal.  He had a Cypher 2.5 x 18 mm at the      anastomosis of his saphenous vein graft to his obtuse marginal, and      he had a percutaneous transluminal coronary angioplasty to his      distal native obtuse marginal.  August 05, 2004, he had a distal      native percutaneous transluminal coronary angioplasty and a 2.0 x      12-mm Mini Vision stent placed, a 3.0 x 32-mm Taxus in his  saphenous vein graft to his obtuse marginal and a 2.75 x 12 Taxus      in his saphenous vein graft to his obtuse marginal.  August 11, 2004, he was recatheterized.  He had a percutaneous transluminal      coronary angioplasty of in-stent restenosis to the proximal      saphenous vein graft to the obtuse marginal.  September 24, 2004, he      was recatheterized.  He had no intervention.  At that time, he was      noted to have patent radial graft to his distal left anterior  descending artery, a patent saphenous vein graft to his obtuse      marginal.  He had 40% restenosis of the proximal stent.  He had a      total saphenous vein graft to his right coronary artery.  His      ejection fraction was 15%.  Jan 16, 2009, he was recatheterized.      His saphenous vein graft to his obtuse marginal was 100%.  His left      internal mammary artery to his left anterior descending artery was      okay.  His free radicals to his distal left anterior descending      artery was patent.  His native vessel, circumflex and left anterior      descending were 100%.  His left main had distal 80%.  His OD that      was not grafted had a proximal 70% and a 60%.  He underwent      stenting of the left main and OD.  He had a 2.25 Driver stent and a      3.0 x 15 Driver into his left main terminal that was extended into      his OD.  On March 16, 2000, he was recatheterized.  He had an      occluded stent in his left main.  He had patent left internal      mammary artery to his left anterior descending , but he had diffuse      disease in the mid left anterior descending, and he had a free      radial graft to his distal left anterior descending.  He was noted      to have severe pulmonary hypertension with pressures of 79/31.      March 20, 2009, he had an upgrade of his ICD to a BiV ICD.  He had a      Medtronic Concerto II BiV ICD placed.  He was not a considered a      third redo bypass graft candidate  because of poor conduits.  At      this point, his only hope is a transplant.  Last catheterization      April 16, 2009 by Dr. Julieanne Manson was a right and left heart      catheterization with graft visualization.  Please see his dictation      for complete details, but basically his left main terminal was 30%.      His stent in the terminal left main extending into the OD was hazy      and functionally occluded.  He had trivial visualization of a      diffusely diseased obtuse marginal and OD.  Right coronary artery      was 100% proximal with faint collaterals to his distal right      coronary artery, and the RV branch of his saphenous vein graft to      his obtuse marginal was 100%.  His saphenous vein graft to his      right coronary artery was 100%.  His  free radical to the distal      left anterior descending, the graft was okay,  the distal left      anterior descending  was okay with collaterals to the obtuse      marginal and posterior descending coronary artery.  His left      internal mammary artery was okay.  The mid left anterior  descending      was small, and it was diffusely diseased.  Right heart      catheterization showed PA pressures of 79/31 which was 49.  Right      ventricular pressures were 70/20.  His cardiac output by thermal      was 2.94.  Cardiac index was 1.26.  Cardiac output by FIGO was      4.14, and index was 1.78.  4. Class III to IV congestive heart failure.  5. Noninsulin-dependent diabetes.  6. Dyslipidemia.  7. Hypothyroidism.  8. Obesity.  9. Severe pulmonary hypertension.  10.Ischemic cardiomyopathy with an ejection fraction now of less than      15% by catheterization Jan 16, 2009.  He has had ischemic      cardiomyopathy since his first myocardial infarction September 04, 1991, at which time he was noted to have moderate left ventricular      dysfunction.   LABORATORY DATA:  Sodium 137, potassium 3.8, chloride 99, CO2 23,  glucose  197, BUN 20, creatinine 1.33.  CPK-MB started on August 11 was  76/2.5 with a troponin of 0.20; #2  - 325/30.9 with a troponin of 10.97;  #3 -  193/13 with a troponin of 6.71.  Magnesium was 2.0, hemoglobin  11.6, hematocrit 35.2, WBCs 11.4, platelets were 269.  Chest x-ray on  the 11th showed interval development of diffuse interstitial airspace  disease, most likely representing edema, stable cardiomegaly, and a  right-sided PICC line terminates in the distal SVC.   CURRENT MEDICATIONS:  As of April 17, 2009:  1. Protonix 40 mg b.i.d.  2. Potassium chloride 20 mEq b.i.d.  3. Sliding scale insulin NovoLog 3 times a day.  4. Digoxin 0.125 mg daily.  5. Levothyroxine 25 mcg a day.  6. Niacin ER 500 mg at night.  7. Plavix was discontinued on April 13, 2009.  8. Carvedilol 12.5 mg b.i.d.  9. Aspirin 325 mg every day.  10.Spironolactone 25 mg every day.  11.Enalapril 20 mg every day.  12.Crestor 20 mg every day.  13.Amaryl 4 mg daily.  14.Isosorbide mononitrate 30 mg a day.  15.Levsinex 0.375 mg b.i.d.  16.IV amiodarone 30 mg an hour.  17.IV dobutamine at 5 mcg/kg per minute.  18.IV furosemide at 10 mg an hour.  19.IV heparin at 1600 units an hour.  20.IV nitroglycerin, currently has been turned off secondary to low      blood pressure.   HOSPITAL COURSE:  Mr. Ehly is an unfortunate 55 year old male who has  had no severe coronary artery disease and cardiomyopathy since age 65.  He came to the hospital again with angina symptoms.  He had actually  been having abdominal pain.  It was decided to recatheterize him to find  out if this was in fact worsening angina.  He recently had had stenting  in May to his left main and OD.  However, this was reoccluded by  catheterization March 16, 2009.  He then had an upgrade to his BiV ICD  for his class III to IV congestive heart failure.  He underwent  catheterization by Dr. Julieanne Manson on Jan 14, 2009, results as stated  above.  It was  felt that the outlook was bad and that his only options  were going to be a possible transplant.  Thus, at this time, these  options are being discussed, and he may be transferred to a medical  facility that offers transplant.  He has been turned down for redo CABG  because of no conduits and no distal targets of his vessels.  Dr. Clarene Duke  felt that he may need also to be ruled out for esophageal and GI issues.  However, he went into pulmonary edema after his catheterization.  He had  to be placed on IV Lasix and dobutamine.  He had ventricular  tachycardia, and his defibrillator fired.  Thus, he was placed on IV  amiodarone.  The morning of April 17, 2009, he was seen by Dr. Susa Griffins who spoke with Dr. Clarene Duke, and it is felt that his best option  was to transfer him to a larger medical center offering transplant  services.      Lezlie Octave, N.P.      Richard A. Alanda Amass, M.D.  Electronically Signed    BB/MEDQ  D:  04/17/2009  T:  04/17/2009  Job:  [id number]   cc:   Valetta Mole. Cato Mulligan, MD  Arlyce Dice, M.D.  Thereasa Solo. Little, M.D.

## 2011-01-19 NOTE — Discharge Summary (Signed)
[name], [name]             ACCOUNT NO.:  [id number]   MEDICAL RECORD NO.:  [id number]          PATIENT TYPE:  INP   LOCATION:  2507                         FACILITY:  [facility]   PHYSICIAN:  Doylene Canning. Ladona Ridgel, MD    DATE OF BIRTH:  1956/02/03   DATE OF ADMISSION:  03/20/2009  DATE OF DISCHARGE:  03/21/2009                               DISCHARGE SUMMARY   ADDENDUM   He was admitted on March 20, 2009, and had the Medtronic Delta CRT-D  system implanted.  This includes a cardiac resynchronization lead.  The  patient had this device implanted for ischemic cardiomyopathy, very high-  grade New York Heart Association class III of congestive heart failure,  and a history of monomorphic ventricular tachycardia.  As dictated in  the formal discharge of March 21, 2009, the patient has a long cardiac  history which dates back to the time when he was 55 years old.  He is  currently now 55.  The implant of the device was successful and the  patient went home on a list of medications.  I will dictate them as  follows:  1. Levoxyl 25 mcg daily.  2. Plavix 75 mg day.  3. Enteric-coated aspirin 325 mg daily.  4. Carvedilol 25 mg 1/2 tablet twice daily.  5. Supposed to be furosemide 2 tabs daily.  He was taking this      according to the office note appended to his admission.  The      patient told me that he was on torsemide.  He did not know the      dose, however, he takes it once a day.  This is the reason for this      addendum is to state that the patient went home on torsemide 1      daily.  6. Lanoxin 0.125 mg daily.  7. Niaspan 500 mg daily each evening.  8. Spironolactone 25 mg daily.  9. Crestor 20 mg daily at bedtime.  10.Klor-Con 20 mEq 2 tabs daily.  11.Enalapril 20 mg daily.  12.Glimepiride 4 mg daily.  13.Sublingual nitroglycerin for chest pain.      Maple Mirza, PA      Doylene Canning. Ladona Ridgel, MD  Electronically Signed    GM/MEDQ  D:  04/24/2009  T:  04/25/2009   Job:  [id number]

## 2011-01-19 NOTE — Cardiovascular Report (Signed)
[name], [name]             ACCOUNT NO.:  [id number]   MEDICAL RECORD NO.:  [id number]          PATIENT TYPE:  INP   LOCATION:  2901                         FACILITY:  [facility]   PHYSICIAN:  Thereasa Solo. Little, M.D. DATE OF BIRTH:  [birth year]   DATE OF PROCEDURE:  01/16/2009  DATE OF DISCHARGE:                            CARDIAC CATHETERIZATION   INDICATIONS FOR TEST:  This unfortunate 55 year old male has had bypass  surgery originally in 1992, and then had redo bypass surgery in 2000.  He has an ischemic cardiomyopathy.  He has a defibrillator in place.  He  has got an ejection fraction of less than 20%.  He has problems with  recurrent congestive heart failure.  Over the last 3 weeks, he has had  worsening episodes of exertional chest pain.  Nitroglycerin responsive  associated with worsening breathlessness and it did not take, but  walking from one room to the other to induce this.   He is brought in for repeat cardiac catheterization recognizing that he  is not a candidate for revascularization both because of no conduit, but  also because of the severity of his LV dysfunction.   PROCEDURE:  The patient was prepped and draped in the usual sterile  fashion exposing the right groin.  Following local anesthetic with 1%  Xylocaine, the Seldinger technique was employed, and 5-French introducer  sheath was placed in the right femoral artery.  The left and right  coronary arteriography, ventriculography, and then PCI was performed.   COMPLICATIONS:  None.   TOTAL CONTRAST:  335 mL.   MEDICATIONS GIVEN:  1. Fentanyl 50 mcg.  2. Versed 2 mg IV.  3. IV Angiomax.  4. Intracoronary nitroglycerin x2.   HEMODYNAMIC MONITORING:  His central aortic pressure was 92/57.  His  left ventricular pressure was 93/20.  He had an elevated left  ventricular end-diastolic pressure of 31.   VENTRICULOGRAPHY:  Ventriculography in the RAO projection using 25 mL of  contrast at 12 mL per second  revealed relatively poor opacification of  the LV.  It was markedly dilated.  The ejection fraction was less than  15%.  It was globally dysfunctional.  No significant mitral  regurgitation was seen and the EDP was 31.   CORONARY ARTERIOGRAPHY:  On fluoroscopy, calcification in the native  vessels.  Multiple stents were seen and what appeared to be the grafts.  Left main.  The left main had distal third had a tapering area to about  80% as it ended.  The circumflex was 100% occluded ostially, the LAD was  100% occluded proximally.  There was an optional diagonal vessel with a  very proximal 70%  area of narrowing, and a proximal 60% area of  narrowing.  The distal vessel, however, which was nongrafted by the way  was at about 2.5 mm in it bifurcated.  There was no evidence any  bidirectional flow during the diagnostic cath.   Right coronary artery 100% occluded with late collateral filling to the  distal RCA.   Through the native circulation, there was large collateral beds to the  right coronary  artery to the OM into the diagonal.   Grafts:  The free radial graft to the distal LAD that was placed in 2000  was patent.  This gave large collateral wedge to the OMs and to the  diagonal.  The saphenous vein graft to the RCA was 100% occluded.  The  saphenous vein graft to the OM was 100% occluded.  The occlusion of the  vein graft to the OM was new since his last cath in 2006.  The internal  mammary artery to the proximal LAD, which was placed in 1992 was still  patent, however, the LAD diagonal system was extremely small.   After recognizing that there was nothing to do with the occluded grafts  and that the only potential improvable solution would be to the optional  diagonal, which was nongrafted.  The decision was made to intervene on  this.  It should be pointed out that this was an extremely tortuous  segment of vessel with areas of saccular dilatation and aneurysmal  dilatation  and high-grade stenoses.   A JL3.5 guide catheter was initially used, but gave very poor backup.  A  6-French JL4 guide catheter was finally used, but it too gave relatively  poor backup.  A short Luge wire was used.  It took multiple attempts to  finally get the guidewire through the left main, through the proximal  vessel, and down into the distal portion of the optional diagonal.  Once  this was accomplished, I predilated the entire segment with a 2.0 x 12  Sprinter balloon.  There was a total of 4 inflations ranging from 11  atmospheres to 30 seconds to 13 atmospheres for 35 seconds.   It should be pointed out it was extremely difficult to even get the  balloon through these areas.   A 2.25 Micro-Driver stent was made ready and with attempts to pass it  into the proximal optional diagonal.  It was difficult to pass through  the left main and through the proximal OD and I finally had it in fair  position.  Unfortunately, I could not advance it anymore and I would  have felt better if I could had it 3-4 mm more distally in the vessel.  After struggling with this for about 25 minutes, I went ahead and  deployed it at 9 atmospheres for 40 seconds, and the final inflation was  11 atmospheres for 22 seconds.   With this inflation, he dropped his blood pressure to 60s.  His heart  rate dropped to 50s and was paced.  He had severe chest pain, became  profoundly diaphoretic.  I removed the balloon and once this was  removed, he stabilized, his heart rate came back to the 70s.  His blood  pressure gradually increased to the 100s.   Once he was hemodynamically stable, I used to 3.0 x 15 Driver stent.  My  goal was to pass this into the distal portion and finally put a third  stent into the left main lesion.  I could not pass this stent anymore  distally than the terminal left main.  I finally deployed it at 9  atmospheres for 40 seconds with a final inflation 9 atmospheres for 25  seconds.   This resulted in a less than optimal result.  I took a post  dilatation Longfellow Sprinter balloon that was 2.5 x 12, and I could not pass  this balloon any further than the left main.  I did do  a single  inflation in this area of 10 atmospheres for 30 seconds, but despite  this, I could not pass the balloon out of the left main.  This was now  an hour and half into the interventional portion.  I had used 335 mL.  He was pain-free and hemodynamically stable.  At this point, I removed  the balloons, left the wire in place, and did an evaluation of the area.  Although not optimal, there was good flow.  Interestingly, the distal  limbs of the OM now had bidirectional flow.  Prior to any type of  intervention, there was absolutely no bidirectional flow.  I cannot  explain this change.   He was taken to the holding area in a stable condition.  I will check  his labs in the morning.  In addition to his BMP, I will check CKs and  troponins.  He has a chronic left bundle-branch block.   He was given 300 mg of Plavix after the procedure.  He is already on  Plavix as an outpatient chronically.   There are plans for him to come back in about 6 weeks for an upgrade to  a biventricular AICD by Dr. Ladona Ridgel.   The long-term outlook for Mr. Dross is very bleak.  There is very  little to offer him revascularization wise and the only other option  would be potential heart transplant in the future.            ______________________________  Thereasa Solo Little, M.D.     ABL/MEDQ  D:  01/16/2009  T:  01/17/2009  Job:  [id number]   cc:   Valetta Mole. Swords, MD  Cath Lab

## 2011-01-19 NOTE — Assessment & Plan Note (Signed)
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  He is status post ICD  implantation, has no intercurrent issues.   MEDICATIONS:  1. Lanoxin.  2. Carvedilol 25 b.i.d.  3. Furosemide 80 b.i.d.  4. Niaspan.  5. Plavix.  6. Spironolactone 12.5.  7. Flomax.  8. Enalapril 20.   PHYSICAL EXAMINATION:  VITAL SIGNS:  His blood pressure 113/50, his  pulse is 71.  LUNGS:  Clear.  HEART:  Heart sounds were regular.  EXTREMITIES:  1+ edema.   Interrogation of his Medtronic ICD demonstrates an R-wave of 6.2 with  impedance of 392 with threshold 1.5 at 0.5, the P-wave is 1.7 with  impedance of 512 with threshold 1 volt at 0.2.  He has a 6949 lead, and  which is okay.   IMPRESSION:  1. Monomorphic ventricular tachycardia.  2. Status post implantable cardioverter-defibrillator for the above.  3. Ischemic cardiomyopathy.  4. 6949 lead.  5. Obesity.   [name] is doing fine.  His device and lead are stable.  We will  plan to see him again 1 year's time.  He will be followed remotely in  the interim.     Duke Salvia, MD, [facility]  Electronically Signed    SCK/MedQ  DD: 05/21/2008  DT: 05/22/2008  Job #: [id number]

## 2011-01-22 NOTE — H&P (Signed)
[name], [name]             ACCOUNT NO.:  [id number]   MEDICAL RECORD NO.:  [id number]          PATIENT TYPE:  INP   LOCATION:  1823                         FACILITY:  [facility]   PHYSICIAN:  Thereasa Solo. Little, M.D. DATE OF BIRTH:  July 24, 1956   DATE OF ADMISSION:  08/11/2004  DATE OF DISCHARGE:                                HISTORY & PHYSICAL   HISTORY OF PRESENT ILLNESS:  [name] is a 55 year old white male who  is again admitted to [facility] for chest pain.   The patient has an extensive history of coronary artery disease and cardiac  interventions.  He has suffered multiple myocardial infarctions.  In 1992,  he underwent coronary artery bypass surgery.  In 2000, he underwent redo  coronary artery bypass surgery.  In April of this year, he underwent  stenting, using a CYPHER stent, to the saphenous vein graft to obtuse  marginal bypass graft.  Six days ago, he underwent complex percutaneous  intervention and stenting to the saphenous vein graft to obtuse marginal  bypass graft.   The patient returned to the emergency department after experiencing an  episode of chest pain at home.  This occurred while he was walking at  approximately 1 a.m.  The chest pain was described as a substernal pressure.  It did not radiate.  It was not associated with dyspnea, diaphoresis, or  nausea.  There were no exacerbating or ameliorating factors.  It appeared  not to be related to position, meals, or respirations.  He did not take any  nitroglycerin at home.  He was given nitroglycerin upon arrival to the  emergency department; this resulted in resolution of the chest pain.  The  total duration of chest pain was approximately 90 minutes.  He indicates the  quality of the chest pain was similar to his prior cardiac chest pain.  He  is free of chest pain and otherwise asymptomatic at this time.   The patient also has a history of ischemic cardiomyopathy with an ejection  fraction estimated to be in the range of 10% to 15%.  He has received an  AICD for ventricular tachycardia.   PAST MEDICAL HISTORY:  Other medical problems include:  1.  Dyslipidemia.  2.  Hypertension.  3.  Hypothyroidism.  4.  Gastroesophageal reflux.   MEDICATION ALLERGIES:  None.   MEDICATIONS:  1.  Aspirin 81 mg p.o. daily.  2.  Plavix 75 mg p.o. daily.  3.  Digoxin 0.125 mg p.o. daily.  4.  Synthroid 0.025 mg p.o. daily.  5.  Niacin 500 mg p.o. nightly.  6.  Coreg 6.25 mg p.o. b.i.d.  7.  Furosemide 40 mg p.o. daily.  8.  Potassium 20 mEq p.o. daily.  9.  Altace 2.5 mg p.o. daily.  10. Vytorin 10/40 p.o. daily.  11. Protonix 40 mg p.o. b.i.d.  12. Spironolactone 12.5 mg p.o. daily.  13. Nitroglycerin p.r.n.   PREVIOUS OPERATIONS:  Two coronary artery bypass operations as described  above.   SIGNIFICANT INJURIES:  None.   SOCIAL:  The patient lives with his mother.  He  works as a Conservation officer, nature at [employer].  He stopped smoking in 2000.  He does not drink alcohol.   PHYSICAL EXAMINATION:  VITAL SIGNS:  Blood pressure 104/48.  Pulse is 106.  Respirations 20.  Temperature 98.4.  GENERAL:  The patient was an obese, middle-aged white man in no discomfort.  He was alert, oriented, appropriate, and responsive.  HEENT:  Head, eyes, nose, and mouth were normal.  NECK:  The neck was without thyromegaly or adenopathy.  Carotid pulses were  palpable bilaterally and without bruits.  CARDIAC:  Examination revealed a normal S1 and S2.  There was no S3, S4,  murmur, rub, or click.  Cardiac rhythm was regular.  No chest wall  tenderness was noted.  LUNGS:  The lungs were clear.  ABDOMEN:  The abdomen was soft and nontender.  There was no mass,  hepatosplenomegaly, bruit, distention, rebound, guarding, or rigidity.  Bowel sounds were normal.  RECTAL AND GENITAL:  Examinations were not performed as they were not  pertinent to the reason for acute care hospitalization.   EXTREMITIES:  The extremities were without edema, deviation, or deformity.  Radial and dorsalis pedal pulses were palpable bilaterally.  NEUROLOGIC:  Brief screening neurologic survey was unremarkable.   LABORATORY AND ACCESSORY CLINICAL DATA:  The chest radiograph revealed  cardiomegaly with vascular congestion, according to the radiologist.   The electrocardiogram revealed sinus tachycardia with occasional PVCs and  evidence of prior inferior and anterolateral myocardial infarction.   The initial set of cardiac markers revealed a myoglobin of 86.4, CK-MB 3.5,  and troponin 0.10.  Second set of markers revealed a myoglobin of 185, CK-MB  4.2, and troponin of 0.10.  Potassium was 3.9, BUN 13, and creatinine 1.0.  The remaining studies were pending at the time of this dictation.   IMPRESSION:  1.  Chest pain; rule out unstable angina.  The first 2 troponins are 0.10      and 0.10.  2.  Coronary artery disease.  Status post multiple myocardial infarctions.      Status post coronary artery bypass operations in 1992 and 2000.  Status      post stents to the saphenous vein graft to obtuse marginal graft in      April 2005.  Status post percutaneous coronary intervention and stent to      the saphenous vein graft to obtuse marginal bypass graft 6 days ago.  3.  Ischemic cardiomyopathy.  Ejection fraction estimated to be 10% to 15%.  4.  Automatic implantable cardioverter-defibrillator.  5.  Dyslipidemia.  6.  Hypothyroidism.  7.  Hypertension.  8.  Gastroesophageal reflux.   PLAN:  1.  Telemetry.  2.  Serial cardiac enzymes.  3.  Aspirin.  4.  Plavix.  5.  Intravenous heparin.  6.  Intravenous nitroglycerin.  7.  Further measures per Dr. Gaspar Garbe B. Little.      [place]   MSC/MEDQ  D:  08/11/2004  T:  08/11/2004  Job:  [id number]

## 2011-01-22 NOTE — Discharge Summary (Signed)
[name], [name]             ACCOUNT NO.:  [id number]   MEDICAL RECORD NO.:  [id number]          PATIENT TYPE:  INP   LOCATION:  2008                         FACILITY:  [facility]   PHYSICIAN:  [name], M.D. DATE OF BIRTH:  07-17-1956   DATE OF ADMISSION:  11/22/2005  DATE OF DISCHARGE:  11/25/2005                                 DISCHARGE SUMMARY   DISCHARGE DIAGNOSES:  1.  Congestive heart failure, secondary to dietary noncompliance, improved      at discharge.  2.  Coronary disease, coronary artery bypass grafting in 1992 with redo      surgery in 2000 and subsequent saphenous vein graft to obtuse marginal      intervention April 2005.  3.  Ischemic cardiomyopathy with an ejection fraction of 10-15%.  4.  History of automatic implantable cardioverter/defibrillator.  5.  Treated hyperlipidemia.  6.  Treated hypothyroidism.  7.  Treated hypertension.  8.  Gastroesophageal reflux disease.   HOSPITAL COURSE:  The patient is a 55 year old male with known coronary  disease. He had bypass surgery in 1992 with redo in 2000. His last  intervention was April of 2005. He has severe cardiomyopathy and had ICD  implanted by Dr. Graciela Bush in April of 2005. He was admitted through the  emergency room with increasing shortness of breath November 22, 2005. He  complained of abrupt shortness of breath while at lunch. He denied chest  pain. He did admit to having some increasing edema in his lower extremities  for the last two to three weeks. On admission, he did have rales and 2 to 3+  distal edema with an O2 sat of 85%. On interview, apparently the patient had  held his Lasix the day before and when out to dinner that night at [facility]. He was admitted to telemetry, started on Natrecor and IV Lasix.  He improved symptomatically with diuresis. His admission weight was 245 and  interestingly his discharge weight is 246, but he has improved. He had about  a 4 liter diuresis. We  feel he can follow up with Dr. Clarene Bush as an  outpatient.   DISCHARGE MEDICATIONS:  1.  Aspirin 81 milligrams a day.  2.  Plavix 75 milligrams a day.  3.  Lanoxin 0.125 milligrams a day.  4.  Synthroid 0.025 milligrams a day.  5.  Niacin 500 milligrams a day.  6.  Coreg 12.5 milligrams b.i.d.  7.  Lasix 40 milligrams a day.  8.  Potassium 20 mEq a day.  9.  Altace 2.5 milligrams a day.  10. Vytorin 10/40 a day.   LABS:  The white count 10.6, hemoglobin 13.4, hematocrit 39.4, platelets  257,000. Sodium 139, potassium 4.0, BUN 12, creatinine 1.0. Troponins peaked  at 0.33, CK-MBs were negative. TSH 5.2. Digoxin level was less than 2. BNP  was 262 on the 19th. Chest x-ray showed cardiomegaly and bilateral edema,  worse than on the previous studies. INR was 1.0.   DISPOSITION:  The patient was discharged in stable condition and will follow  up with Dr. Clarene Bush in a couple weeks  in the office. We had a long discussion  about compliance with a low-sodium diet and fluid restriction and taking his  medications as scheduled.      [name], P.A.    ______________________________  [name], M.D.    [name]  D:  11/25/2005  T:  11/26/2005  Job:  [id number]

## 2011-01-22 NOTE — Cardiovascular Report (Signed)
NAMEBRONDON, [name]             ACCOUNT NO.:  [id number]   MEDICAL RECORD NO.:  [id number]          PATIENT TYPE:  INP   LOCATION:  6523                         FACILITY:  [facility]   PHYSICIAN:  Darlin Priestly, MD  DATE OF BIRTH:  10-16-1955   DATE OF PROCEDURE:  08/11/2004  DATE OF DISCHARGE:                              CARDIAC CATHETERIZATION   PROCEDURES:  1.  Left heart catheterization.  2.  Coronary angiography.  3.  Left ventriculogram.  4.  Saphenous vein graft.  5.  Saphenous vein graft to obtuse marginal.      1.  Percutaneous transluminal coronary balloon angioplasty.   ATTENDING PHYSICIAN:  Darlin Priestly, M.D.   COMPLICATIONS:  None.   INDICATIONS:  [name] is a 55 year old male patient of Dr. Caprice Kluver  with a history of significant coronary artery disease status post coronary  artery bypass surgery in 1992 with re-do surgery in 2000 consisting of a  free RIMA to distal LAD, vein graft to OM and vein graft to RCA.  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  He  underwent repeat catheterization in April 2005 with severe disease noted in  the mid portion of the vein graft to the OM with subsequent percutaneous  transluminal coronary angioplasty and stenting using a 3 mm stent by Dr.  Tresa Endo.  He underwent repeat catheterization in November by Dr. Caprice Kluver  with significant disease beyond the previously placed OM stent with  placement of three additional stents.  He was readmitted on the morning of  August 11, 2004 with recurrent chest pain and is now brought for repeat  catheterization to reassess the vein graft to the OM.   DESCRIPTION OF OPERATION:  After obtaining informed written consent, the  patient was brought to the cardiac catheterization laboratory.  Right and  left groin were  shaved and prepped and draped in the usual sterile fashion.  ECG monitor was established.  Using the modified Seldinger technique, a  6  French arterial sheath was inserted in the right femoral artery. A 6 French  diagnostic catheter was then used to performed diagnostic angiography.   The left main is a medium to large size vessel with 70% distal stenosis.   The LAD is a medium size vessel which gives rise to a small diagonal branch  and then is totally occluded in its proximal portion.  The mid and distal  LAD fill via patent free RIMA to the LAD which appears to be widely patent.  There does not appear to be any significant disease of the RIMA or to the  RIMA insertion.  There are left-to-right collaterals to the distal RCA  noted.   The first diagonal is a small vessel which bifurcates in the mid segment  with diffuse 90% disease.   Left circumflex is totally occluded in its proximal portion.   There is a patent saphenous vein graft to obtuse marginal which also fills a  second obtuse marginal and distal AV groove circumflex via left-to-left  collaterals.  There are multiple overlapping stents  noted in the mid and  distal portion of the vein graft to the OM.  The stent recently placed by  Dr. Clarene Duke appeared to be widely patent.  However, the stent placed in April  appears to have 70% in-stent restenosis.  There is TIMI-3 flow to the distal  vessel.   The right coronary artery is totally occluded in its proximal portion.  There are right-to-right collaterals to the mid RCA filling the mid and  distal RCA, PDA and posterior lateral branch.  As previously stated, there  are left-to-right collaterals to the distal RCA.   Left ventriculogram reveals severely depressed EF of 15-20% with global  hypokinesis.   The saphenous vein graft to the RCA is noted to be totally occluded by  previous catheterization.   The LIMA to the LAD by previous catheterization appears to insert on the  small diagonal branch or septal perforator, but does not appear to fill the  LAD.   HEMODYNAMICS:  Systemic arterial pressure  120/70, LV pressure 120/20, LVEDP  of 28.   INTERVENTIONAL PROCEDURE:  Saphenous vein graft to OM.  Following diagnostic  angiography, a #6 French LCB guiding catheter with side holes was engaged in  the vein graft to the OM.  Next, a 0.014 Cougar XT guide wire was advanced  out of the guiding catheter and used to cross into the OM without  difficulty.  Next, a Maverick 3.0 x 20-mm balloon was then tracked across  the in-stent restenosis in the most proximal stent and two subsequent  inflations to a maximum of 14 atmospheres were then performed for a total of  approximately 1 minute and 15 seconds.  Followup angiogram revealed good  luminal gain.  However, there was approximately 50-60% residual stenosis  within the stent.  This balloon was then exchanged for a Quantum Maverick  3.5 x 15-mm balloon which was then tracked across the same proximal in-stent  restenosis.  Three subsequent inflations to a maximum of 16 atmospheres were  then performed for a total of approximately 1 minute and 30 seconds.  Followup angiogram revealed TIMI-3 flow in the distal vessel with no  evidence of dissection evident.  There did appear to be a very small filling  defect within the most distal stent at the origin of a very small obtuse  marginal.  However, again, this was within the previously placed stent.  It  did not appear to be a focal dissection and there remained TIMI-3 flow to  the distal vessel which we elected to leave alone.  He was continued on IV  Angiomax throughout the case.   Final orthogonal angiograms revealed approximately 20% residual in-stent  restenosis of the proximal mid saphenous vein graft to the OM stent with  TIMI-3 flow to the distal vessel.  At this point we elected to conclude the  procedure.  All balloons, wires, and catheters were removed.  Hemostatic sheath was sewn in place.  The patient transferred back to the ward in  stable condition.   CONCLUSION:  1.  Successful  percutaneous transluminal coronary balloon angioplasty of the      in-stent restenosis most proximal stent in the saphenous vein graft to      the obtuse marginal.  2.  Significant three-vessel coronary artery disease.  3.  Patent radial graft to left anterior descending.  4.  Totally occluded vein graft to right coronary artery.  5.  Severely depressed left ventricular systolic function.  6.  Adjunct use of  Angiomax infusion.      [place]   RHM/MEDQ  D:  08/11/2004  T:  08/11/2004  Job:  [id number]

## 2011-01-22 NOTE — Consult Note (Signed)
NAME:  [name], [name]                       ACCOUNT NO.:  [id number]   MEDICAL RECORD NO.:  [id number]                   PATIENT TYPE:  INP   LOCATION:  2313                                 FACILITY:  [facility]   PHYSICIAN:  Shan Levans, M.D. [facility]            DATE OF BIRTH:  Sep 14, 1955   DATE OF CONSULTATION:  11/30/2003  DATE OF DISCHARGE:                                   CONSULTATION   CONSULTING PHYSICIAN:  Shan Levans, M.D. [facility]   CHIEF COMPLAINT:  Acute pulmonary edema, respiratory failure.   HISTORY OF PRESENT ILLNESS:  A 55 year old white male, nonadherent to  medication, history of obesity, hypertension, ischemic heart disease,  previous bypass surgery twice, severe coronary artery disease, two previous  myocardial infarctions, brought in with acute onset of shortness of breath,  frothy sputum noted in the endotracheal tube __________  admitted.  Denied  chest pain.  No fever, chills, or sweats.  He did have a dry hacking cough  for two weeks.  Question whether or not he had been taking his medicines or  not.  He was intubated by the emergency department on arrival.  He does have  positive enzymes and he is now in the intensive care unit on mechanical vent  support.  We were asked by [facility] to assist with  ventilator support and other ICU issues.   PAST MEDICAL HISTORY:  1. Ischemic cardiomyopathy, ejection fraction 25%, with previous bypass     surgery twice.  2. History of obesity.  3. Hypertension.  4. The patient is not a diabetic.  5. 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  6. History of  __________ hyperlipidemia.   CURRENT MEDICATIONS:  1. Aspirin 81 mg daily.  2. Lopressor p.r.n.  3. Multiple other p.r.n. medications are noted.   FAMILY HISTORY:  Otherwise noncontributory.   SOCIAL HISTORY:  Otherwise noncontributory.   REVIEW OF SYSTEMS:  Otherwise noncontributory.   PHYSICAL EXAMINATION:  VITAL SIGNS:  Blood pressure was 100/55, pulse 80,  sat  99%.  The patient is orally intubated.  Temperature 100.  CHEST:  Showed distant breath sounds, bilateral rales.  CARDIAC:  Showed a resting regular, rate and rhythm without S3.  Normal S1  S2.  ABDOMEN:  Protuberant.  There was trace edema.  SKIN:  Clear.  NEUROLOGIC:  The patient is sedated on a vent.  HEENT:  Orally intubated.  Jugular venous distention noted.  Otherwise  unremarkable HEENT exam.   LABORATORY DATA:  White count 14.7, hemoglobin 13.1, platelet count 335.  Sodium 140, potassium 4.2, chloride 108, CO2 25, BUN 19, creatinine 1.3,  blood sugar 100.  Liver function test unremarkable.  Troponin 0.61.  CK-MB  4.8.  D-dimmer 2.71.  BNP 570.   Chest x-ray shows pulmonary edema, cardiomegaly, endotracheal tube in  satisfactory position.   IMPRESSION:  1. Congestive heart failure.  2. Acute pulmonary edema.  3. Ventilator-dependent respiratory failure.  4. Previous  non-adherence to medications.  5. Ischemic cardiomyopathy, ejection fraction 25%.  6. Severe coronary artery disease.  7. Obesity.  8. Hypertension.  9. Hyperlipidemia.  10.      Positive cardiac enzymes.  11.      Low-grade fever.   RECOMMENDATIONS:  Check sputum C&S and Gram stain, administer IV Rocephin,  administer neb treatments, give DVT prophylaxis, proton pump inhibitors, and  increase PEEP on vent, titrate FIO2 downwards, followup blood gasses.  Will  continue to follow the patient with you.                                               Shan Levans, M.D. [facility]    PW/MEDQ  D:  11/30/2003  T:  11/30/2003  Job:  [id number]

## 2011-01-22 NOTE — Cardiovascular Report (Signed)
NAME:  [name], [name]                       ACCOUNT NO.:  [id number]   MEDICAL RECORD NO.:  [id number]                   PATIENT TYPE:  INP   LOCATION:  2910                                 FACILITY:  [facility]   PHYSICIAN:  Thereasa Solo. Little, M.D.              DATE OF BIRTH:  Sep 20, 1955   DATE OF PROCEDURE:  12/06/2003  DATE OF DISCHARGE:                              CARDIAC CATHETERIZATION   INDICATIONS FOR TEST:  Mr. Norcia is a 55 year old male who had bypass  surgery in 2002 and re-do in 2000.  He presented November 30, 2003 with acute  pulmonary edema and a non-Q-wave myocardial infarction.  At that time, he  had wide complex tachycardia which was probably ventricular tachycardia.  Subsequently, this has resolved, but he has had recurrent episodes of  bradycardia with rates to 36 and intermittent pauses.  The longest pause was  4.2 seconds.  This is after his beta blockers had been discontinued.   He developed an unexplainable small bowel obstruction/ileus that has  improved considerably over the last 48 hours.  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  Blood cultures had been negative thus far.   The patient was prepped and draped in the usual sterile fashion exposing the  right groin. Following local anesthetic with 1% Xylocaine, the Seldinger  technique was employed and a 5 [place] introducer sheath was placed into the  right femoral artery.  A 6 French introducer sheath in the right femoral  vein.  A 5 French temporary pacemaker wire was placed into the apex of the  right ventricle with good capture to an MA of 0.1.   Left heart catheterization including coronary arteriography and graft  visualization was performed.   EQUIPMENT:  5 French Judkins configuration catheters, left internal mammary  artery catheter, left coronary bypass catheter was used to cannulate the  graft to the LAD.  The internal mammary artery was cannulated with a right  coronary catheter.   TOTAL  CONTRAST:  175 mL.   RESULTS:   HEMODYNAMIC MONITORING:  Central aortic pressure was 142/93.  Left  ventricular pressure was 141/29.   VENTRICULOGRAPHY:  Ventriculography in the RAO projection using 25 mL of  contrast at 12 mL per second revealed a severely dilated left ventricle.  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  Ejection fraction was approximately 15% and  the end-diastolic pressure was markedly elevated at 50.   DISTAL AORTOGRAM:  A distal aortogram was performed because of difficulty  passing from the iliacs into the distal aorta.  A right coronary catheter  and a glide wire had to be used to navigate this region.  A distal aortogram  showed no abdominal aneurysm, no renal artery stenosis and hypolucent areas  of the ostium of both iliacs.  Of note, there was marked abdominal gas and  dilatation of the bowel loops.  CORONARY ARTERIOGRAPHY:  1. Terminal left main of 70%, proximal LAD of 80% and 100% occlusion of the     LAD after the septal perforator.  2. Circumflex:  100% occluded.  3. Right coronary artery:  100% occluded.   GRAFTS:  1. Saphenous vein graft to the right coronary artery was 100% occluded.  2. Saphenous vein graft to the OM had an area in the proximal portion of the     mid segment that was 90% narrowed with TIMI-2 flow.  There was mild     distal irregularities in the graft and the OM vessel that it supplied was     extremely small, less than 1.5 mm in diameter and less than 1.5-2 cm in     length.  3. Free radial to the LAD:  This graft was widely patent and inserted into     the distal LAD which was free of disease.  There was also some left-to-     right collaterals noted and the LAD appeared to be free of disease.  4. Left internal mammary artery to the proximal LAD:  The internal mammary     artery was widely patent.  It inserted into what appeared to be only     remnants of small  twig-like vessels less than 1 mm in diameter that were     less than a centimeter in length.   At the conclusion of the procedure, the patient had rales and was wheezing  and was given 80 mg of IV Lasix.  The temporary pacemaker was left in place  and the patient was transferred to the holding area.   Intervention to the saphenous vein graft to the OM was not performed because  of the concern of MRSA.  The temporary pacemaker, however, was left in place  reluctantly.  This patient is going to need an AICD biventricular pacer and  I have made Dr. Lewayne Bunting aware of this.  I plan to ask ID to help [place]  with the decision for when we can safely intervene because of MRSA.   The outlook for this patient is extremely bleak and he is not a candidate  for re-do bypass surgery based on his severe LV dysfunction.                                               Thereasa Solo. Little, M.D.    ABL/MEDQ  D:  12/06/2003  T:  12/07/2003  Job:  [id number]   cc:   Shan Levans, M.D. [facility]   Doylene Canning. Ladona Ridgel, M.D.   Fransisco Hertz, M.D.  [address]  [place] [address]  Fax: [phone]

## 2011-01-22 NOTE — Cardiovascular Report (Signed)
NAMEJONATHON, [name]             ACCOUNT NO.:  [id number]   MEDICAL RECORD NO.:  [id number]          PATIENT TYPE:  OIB   LOCATION:  2899                         FACILITY:  [facility]   PHYSICIAN:  [name], M.D. DATE OF BIRTH:  [birth year]   DATE OF PROCEDURE:  08/05/2004  DATE OF DISCHARGE:                              CARDIAC CATHETERIZATION   PROCEDURE:  Cardiac catheterization.   INDICATION:  This unfortunate 55 year old male had redo bypass surgery in  2000; his initial bypass surgery was in 1992.  He has a 15% ejection  fraction and has an AICD in place.  He began having exertional and resting  indigestion that had been his anginal equivalent in the past and with this,  had nonspecific ST segment changes in the lateral leads.  He is brought in  for outpatient cardiac catheterization with graft visualization.   DESCRIPTION OF PROCEDURE:  After obtaining informed consent, the patient was  prepped and draped in the usual sterile fashion, exposing the right groin.  After applying a local anesthetic with 1% Xylocaine, the Seldinger technique  was employed and a 5-French introducer sheath was placed into the right  femoral artery.  Left and right coronary arteriography and ventriculography  in the RAO projection, and graft visualization were performed.  Following  this, a complex percutaneous intervention to the saphenous vein graft to the  OM was performed.   COMPLICATIONS:  None.   TOTAL CONTRAST:  275 mL.   EQUIPMENT:  The 5-French diagnostic catheters.  An LCB 5-French was used to  cannulate the free radial graft to the LAD.  The internal mammary artery was  cannulated with a right coronary catheter.   RESULTS:   HEMODYNAMIC MONITORING:  Central aortic pressure was 132/84.  Left  ventricular pressure was 133/21 with no significant valve gradient noted at  the time of pullback.   VENTRICULOGRAPHY:  Ventriculography in the RAO projection using 20 mL of  contrast at  12 mL per second revealed a severely dilated left ventricle with  global akinesia.  The posterior basilar segment showed the best  contractility.  The ejection fraction was approximately 15%.  The left  ventricular end-diastolic pressure was 20.   CORONARY ARTERIOGRAPHY:  On fluoroscopy, calcification was seen in the  distribution of the left main and LAD.   1.  Left main:  Distal 80% narrowing.  2.  Circumflex 100% occluded proximally.  3.  LAD:  The LAD had 80% ostial narrowing at the left main with mild post-      stenotic dilatation and then it was subtotaled proximal to a very large      septal perforator.  The distal LAD was faintly visualized in its      midportion with some evidence of bidirectional flow.  4.  Right coronary artery:  The right coronary artery was 100% occluded      proximally just after a very large atrial branch.  This atrial branch      actually filled the circumflex and then retrograde filled the RCA back      to its midportion.  (Right-to-left-to-right collaterals.)  5.  Saphenous vein graft to the OM:  The saphenous vein graft was markedly      diseased.  This graft had had a 3.0 x 28.0 CYPHER stent put in its      proximal portion and a 2.5 x 12.0 CYPHER stent placed in the distal      portion; this was performed, April of 2005.  In addition to this, the      distal anastomotic site was angioplastied.  Today, there is 50% to 60%      distal in-stent restenosis in the 3.0 x 28.0 CYPHER stent.  There is a      long segment in the midportion and distal portion of the graft that was      non-stented that is 60% to 70% narrowed and at the distal anastomotic      site, there is an area of 95% narrowing that was a previous angioplasty      site.  The OM vessel itself is relatively small.  6.  Saphenous vein graft to the RCA 100% occluded at the aorta.  7.  Left internal mammary artery to the LAD:  The internal mammary artery      was patent, but it appeared to  insert into the proximal LAD and the LAD      was 100% occluded at this anastomotic site.  8.  Free radial graft to the distal LAD:  The graft was widely patent.  The      LAD at the distal portion was small, but it did cross the apex of the      heart and there were left-to-left collaterals filling the distal OM and      the PDA.   CONCLUSION:  Severe native disease with high-grade stenosis in the saphenous  vein graft to the obtuse marginal which had  been previously intervened upon in 3 different areas, April of 2005.   INTERVENTIONAL PROCEDURE:  Complex intervention to the saphenous vein graft  was then undertaken.  The system was upgraded to a 6-French system.   GUIDE CATHETERS:  A JR4 guide catheter was initially used.  Angioplasty to  the distal anastomotic site was performed, but there was not adequate backup  with this particular guide catheter to pass the stent.   An XBRCA guide catheter was used to place a stent at the distal anastomotic  site, but after the catheter became soft, it would not stay in the graft.   An LCB guide catheter was then used to finish the intervention which  included angioplasty in the body of the graft and stenting x2 in the body of  the graft.   WIRES:  A short luge x2.   The distal anastomotic site which was 95% narrowed was first angioplastied  with a 2.0 x 15.0 Maverick balloon.  Two inflations, 12 x 50 and 12 x 40  seconds, were performed.  Following this, a 2.0 x 12.0 MINI VISION stent was  placed, initially deployed at 13 x 45, with the final inflation being 14 x  60.  The area that had been 95% narrowed previously now appeared to be  normal after stenting with brisk distal flow with no evidence of any  dissection or thrombus.   Attempts at primary stenting of the saphenous vein graft were unsuccessful.  The stent would not pass into the distal portion of the graft.  A 2.5 x 15.0 Maverick balloon was then made ready and the entire body  of the  graft where the above-mentioned stenosis was located was performed.  This  included the stenosis in the distal portion of the proximal vein graft and  in the proximal portion of the distal stent.   A 3.0 x 32.0 TAXUS stent was then placed into the saphenous vein graft in  such a manner that it overlapped the proximal CYPHER stent.  It was deployed  at 15 x 45 with the final inflation being 16 atmospheres for 45 seconds.  There was an excellent result with no residual obstruction in the previously  placed stent and brisk distal flow, however, there was a small segment of  about 5 mm between the TAXUS stent and the distal CYPHER stent that was  placed in '04.   A 2.75 x 12.0 TAXUS stent was then made ready and overlapped both the TAXUS  and the CYPHER stents.  It was deployed at 17 atmospheres for 55 seconds.   At the termination of the procedure, the areas of in-stent restenosis and  the area of stenosis within the body of the graft were widely patent with  brisk TIMI-3 flow with no evidence of any dissection or thrombus.  The  distal anastomotic site was widely patent with a MINI VISION stent.  There  was no evidence of any distal embolization.   The patient was given IV Integrilin double-bolus and will be maintained on  Integrilin for 18 additional hours.  Because of some coughing and an  elevated left ventricular end-diastolic pressure, the patient was given 40  mg of IV Lasix during the cardiac catheterization.   He originally received 5000 units of heparin, had an ACT before the  intervention of 351; at the termination of the intervention, his ACT was  206; he was given an additional thousand units of heparin.       ABL/MEDQ  D:  08/05/2004  T:  08/05/2004  Job:  [id number]   cc:   [facility]

## 2011-01-22 NOTE — Op Note (Signed)
NAME:  [name], [name]                       ACCOUNT NO.:  [id number]   MEDICAL RECORD NO.:  [id number]                   PATIENT TYPE:  INP   LOCATION:  2910                                 FACILITY:  [facility]   PHYSICIAN:  Doylene Canning. Ladona Ridgel, M.D.               DATE OF BIRTH:  [birth year]   DATE OF PROCEDURE:  12/11/2003  DATE OF DISCHARGE:                                 OPERATIVE REPORT   PROCEDURE PERFORMED:  Revision of atrial lead which had dislodged.   I:  INTRODUCTION:  The patient is a 55 year old man with premature  atherosclerosis and ischemic cardiomyopathy who presented to the hospital  with sustained monomorphic VT.  He also has a history of bradycardia and  ejection fraction of 25%.  He underwent dual chamber ICD insertion one day  ago and his atrial lead dislodged and he is now referred for atrial lead  repositioning and revision.   II:  PROCEDURE:  After informed consent was obtained, the patient was taken  to the diagnostic EP lab in a fasting state.  After the usual preparation  and draping, intravenous Fentanyl and Midazolam was given for sedation.  30  mL of lidocaine was infiltrated into the left infraclavicular region.  A 10  cm incision was carried out over this region at the old ICD incision.  Electrocautery was utilized to dissect down to the fascial plane.  The  pocket was entered without difficulty.  The generator was removed from the  pocket and the atrial and defibrillation leads removed from the generator.  The defibrillation lead was in satisfactory condition and position with  satisfactory pacing and sensing parameters.   Attention was turned to the atrial lead.  It was freed up from its silk  suture and fluoroscopy demonstrated that the lead was in the right  ventricle.  The lead was subsequently withdrawn and the active fixation  device repositioned back into the lead.  Mapping was carried out in the  lateral wall of the right atrium with an atrial  J-wire inserted into the  lead and at the final site on the lateral wall of the right atrium, the P  waves measured approximately 3 millivolts and the atrial threshold was less  than 1 volt at 0.5 milliseconds once the lead was actively fixed.  Pacing  impedance was 576 ohms.  10 volt volt pacing did not stimulate the  diaphragm.  With the atrial lead in satisfactory position, it was secured to  the subpectoralis fascia with a figure-of-eight silk suture.  In addition,  the sewing sleeve was secured with silk suture.  Additional Kanamycin was  utilized to irrigate the incision and the Medtronic Maximow dual chamber  defibrillator, model 7278, serial number [id number], was reconnected to the  atrial and defibrillation leads and placed back in the subcutaneous pocket.  Additional Kanamycin was utilized to irrigate the incision and the generator  secured with a silk  suture.  Defibrillation threshold testing was carried  out.  After the patient was more deeply sedated with Fentanyl and Versed, VF  was induced with a T wave shock and a 15 joule shock then delivered  restoring sinus rhythm.  No additional defibrillation testing was carried  out and the incision was closed with a layer of 2-0 Vicryl followed by a  layer of 3-0 Vicryl followed by a layer of 4-0 Vicryl.  Benzoin was painted  on the skin, Steri-Strips were applied, and a pressure dressing placed.  The  patient was returned to his room in satisfactory condition.   III:  COMPLICATIONS:  There were no immediate procedure complications.   IV:  RESULTS:  Successful atrial lead repositioning in a patient who is  status post dual chamber ICD with atrial lead dislodgement.                                               Doylene Canning. Ladona Ridgel, M.D.    GWT/MEDQ  D:  12/11/2003  T:  12/11/2003  Job:  [id number]   cc:   Thereasa Solo. Little, M.D.  [address]  [address]  [place]  [place] [address]  Fax: [phone]

## 2011-01-22 NOTE — Discharge Summary (Signed)
NAMEMAYNARD, DAVID             ACCOUNT NO.:  [id number]   MEDICAL RECORD NO.:  [id number]          PATIENT TYPE:  OIB   LOCATION:  6522                         FACILITY:  [facility]   PHYSICIAN:  Thereasa Solo. Little, M.D. DATE OF BIRTH:  28-Jul-1956   DATE OF ADMISSION:  08/05/2004  DATE OF DISCHARGE:  08/06/2004                                 DISCHARGE SUMMARY   DISCHARGE DIAGNOSES:  1.  Complex percutaneous coronary intervention and stenting to the saphenous      vein graft to obtuse marginal, this admission.  2.  Coronary disease, coronary artery bypass grafting in 1992 with redo      surgery in 2000 and saphenous-vein-graft-to-obtuse-marginal CYPHER      stenting in April of 2005.  3.  Ischemic cardiomyopathy with an ejection fraction of 10% to 15%.  4.  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 Doylene Canning. Ladona Ridgel.  5.  Treated hyperlipidemia.  6.  Gastroesophageal reflux.  7.  Treated hypothyroidism.   HOSPITAL COURSE:  The patient is a 55 year old male followed by Dr. Gaspar Garbe  B. Little with a history of coronary disease.  He had bypass surgery in 1992  with redo surgery in 2000.  In April of 2005, he underwent SVG-to-OM CYPHER  stenting.  He has had some chest pain off and on recently.  He had seen Dr.  Barbette Hair. Arlyce Dice as an outpatient and his Protonix was increased to b.i.d.,  but he continued to have epigastric discomfort.  He was seen in the office,  July 20, 2004, and set up for diagnostic catheterization; this was done  on August 05, 2004 by Dr. Clarene Duke.  He had in-stent restenosis to the SVG  to OM and also a new stenosis in the SVG to OM.  He underwent complex PCI  and stenting using a TAXUS stent and a CYPHER stent.  The original stent  that was placed in April of 2005 was overlapped.  Ultimately, Dr. Clarene Duke got  a good result.  The patient tolerated the procedure well and we feel he can  be discharged, August 06, 2004.   LABORATORIES:  White count is 10.2, hemoglobin 12.4, hematocrit 35.8,  platelets 258,000.  Sodium 139, potassium 4.0, BUN 10, creatinine 1.2,  glucose 104.  Troponin is 0.2.  CK is 84, MB 4.4.   Chest x-ray showed cardiomegaly, no evidence of acute disease.   EKG reveals sinus rhythm, intraventricular conduction delay with PVCs.   DISCHARGE MEDICATIONS:  1.  Aspirin 81 mg a day.  2.  Plavix 75 mg a day.  3.  Lanoxin 0.125 mg a day.  4.  Synthroid 0.025 mg a day.  5.  Niacin 500 mg at h.s.  6.  Coreg 6.25 mg b.i.d.  7.  Lasix 40 mg a day.  8.  Potassium 20 mEq a day.  9.  Altace 2.5 mg a day.  10. Vytorin 10/40 once a day.  11. Protonix 40 mg b.i.d.  12. Nitroglycerin sublingual p.r.n.  13. Aldactone 12.5 mg a day was added to his current medications.   DISPOSITION:  The patient is discharged in stable condition and will follow  up with Dr. Clarene Duke in the office in a couple of weeks.  He will need a  followup BMP while on Aldactone.       LKK/MEDQ  D:  08/06/2004  T:  08/06/2004  Job:  [id number]   cc:   Barbette Hair. Arlyce Dice, M.D. [facility]   Bruce H. Swords, M.D. [facility]   Doylene Canning. Ladona Ridgel, M.D.

## 2011-01-22 NOTE — Discharge Summary (Signed)
[name]             ACCOUNT NO.:  [id number]   MEDICAL RECORD NO.:  [id number]          PATIENT TYPE:  INP   LOCATION:  6523                         FACILITY:  [facility]   PHYSICIAN:  Thereasa Solo. Little, M.D. DATE OF BIRTH:  1955/12/21   DATE OF ADMISSION:  08/11/2004  DATE OF DISCHARGE:  08/13/2004                                 DISCHARGE SUMMARY   DISCHARGE DIAGNOSES:  1.  Subendocardial myocardial infarction after saphenous vein graft to      obtuse marginal percutaneous coronary intervention this admission.  2.  Coronary disease.  Coronary artery bypass grafting in 1992 with redo      surgery in 2000 and a recent saphenous vein graft to obtuse marginal      stent placement on August 05, 2004.  3.  Hyperlipidemia.  4.  Ischemic cardiomyopathy with an ejection fraction of 10-15%.  5.  History of automatic implantable cardioverter/defibrillator.  6.  Treated hypothyroidism.  7.  Gastrointestinal reflux.  8.  Treated hypertension.   HISTORY OF PRESENT ILLNESS:  The patient is a 55 year old male followed by  Dr. Clarene Duke, who had bypass surgery in 1992 with redo in 2000.  He had an SVG  to the OM stent in April of 2005 and again a complex PCI and stenting of the  SVG to OM on August 05, 2004.  He was recommended by Dr. Effie Shy on  August 11, 2004, with unstable angina.   HOSPITAL COURSE:  He was admitted to telemetry and started on IV heparin and  IV nitroglycerin.  He was taken to the catheterization laboratory on  August 11, 2004, by Dr. Jenne Campus.  Catheterization revealed a patent SVG to  the LAD, a 70% end-stent restenosis to the SVG to the OM, a total RCA with  right to right collaterals and a total native circumflex.  There were some  left to left collaterals to the distal circumflex and also some left to  right collaterals to the distal RCA.  The vein graft to the RCA was totally  occluded.  His EF was 15-20% with global hypokinesis.  The patient underwent  PTCA for end-stent restenosis to the SVG to OM.  He was put on Angiomax  afterwards.  Post procedure he did bump his enzymes with a CK of 225 with  31.6 MBs.  Symptomatically he did well.  We feel that he can be discharged  on August 13, 2004.  We did increase his Coreg to 12.5 mg b.i.d.  He has a  followup appointment with Dr. Clarene Duke on September 01, 2004, and will keep  this.   LABORATORIES:  White count 8.8, hemoglobin 11.2, hematocrit 32.3, platelets  304.  Sodium 137, potassium 3.8, BUN 5, creatinine 0.8.  LFTs were normal.  CKs peaked as noted above.  The lipid profile shows a cholesterol of 132  with an HDL of 26 and an LDL of 89.  Chest x-ray showed cardiomegaly and  mild pulmonary vascular congestion with a stable pacemaker.  INR was 0.9.   DISCHARGE MEDICATIONS:  1.  Aspirin 81 mg a day.  2.  Plavix  75 mg a day.  3.  Lanoxin 0.125 mg a day.  4.  Synthroid 0.025 mg a day.  5.  Niacin 500 mg at h.s.  6.  Coreg 12.5 mg twice a day.  7.  Furosemide 40 mg a day.  8.  Potassium 20 mg a day.  9.  Nitroglycerin sublingual p.r.n.   DISPOSITION:  The patient was discharged in stable condition.   FOLLOWUP:  He will follow up with Dr. Clarene Duke on September 01, 2004, at noon.       LKK/MEDQ  D:  08/13/2004  T:  08/13/2004  Job:  [id number]

## 2011-01-22 NOTE — Consult Note (Signed)
NAME:  [name], [name]                       ACCOUNT NO.:  [id number]   MEDICAL RECORD NO.:  [id number]                   PATIENT TYPE:  INP   LOCATION:  2910                                 FACILITY:  [facility]   PHYSICIAN:  Doylene Canning. Ladona Ridgel, M.D.               DATE OF BIRTH:  05-Apr-1956   DATE OF CONSULTATION:  12/06/2003  DATE OF DISCHARGE:                                   CONSULTATION   CONSULTING PHYSICIAN:  Doylene Canning. Ladona Ridgel, M.D.   REQUESTING PHYSICIAN:  Thereasa Solo. Little, M.D.   INDICATION FOR THE CONSULTATION:  Evaluation of sustained monomorphic VT in  the setting of an ischemic cardiomyopathy and pulmonary edema.   HISTORY OF PRESENT ILLNESS:  The patient is a 55 year old male with  premature atherosclerosis, status post bypass surgery initially, in 1992,  with redo bypass in 2000.  He is status post myocardial infarction and has  an EF of anywhere from 10-20%.  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  He was evaluated in the emergency room, where he was in a sustained wide QRS  tachycardia at 166 beats per minute.  He was treated with beta-blockers and  his tachycardia spontaneously returned to sinus rhythm.  This tachycardia  was a right bundle branch block tachycardia with a right superior axis.  The  patient subsequently ruled in for a small non-Q wave MI.  He ventilatory  dependent respiratory failure and required intubation but was eventually  extubated.  He has undergone catheterization which demonstrates a high grade  vein graft stenosis and severe three vessel coronary disease in his native  circulation.  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  He has had no significant fever and blood cultures have been  sterile.  He is referred for evaluation.  The patient denies a history of  syncope.   PAST MEDICAL HISTORY:  1. As previously noted.  2. He has  also had bradycardia and status post temporary transvenous     pacemaker insertion.   SOCIAL HISTORY:  The patient is divorced.  He had a long history of tobacco  abuse.  He denies alcohol abuse.   FAMILY HISTORY:  Negative for coronary disease.   REVIEW OF SYSTEMS:  Notable for no vision or hearing problems.  He has  shortness of breath as previously noted but denies PND or orthopnea.  He  denies hemoptysis, or cough, or chronic sputum production.  He denies  claudication.  He denies nausea, vomiting, diarrhea, or constipation.  He  denies any skin changes.  He denies arthritic complaints.  He denies  neurologic changes.   PHYSICAL EXAMINATION:  GENERAL:  He is a pleasant middle-aged young man in  no obvious distress.  VITAL SIGNS:  The blood pressure was 126/76, the pulse was 76 and  regular,  the respirations were 20, temperature was 97.6.  HEENT:  Normocephalic and atraumatic.  Pupils were equal and round.  The  oropharynx was moist.  The sclerae were anicteric.  NECK:  Revealed no jugular venous distention.  There was no thyromegaly.  The trachea was midline.  LUNGS:  Clear bilaterally auscultation.  There were no wheezes, rales, or  rhonchi.  CARDIOVASCULAR:  Regular, rate and rhythm with normal S1 and S2.  There are  no murmurs, rubs or gallops.  ABDOMEN:  Soft, nontender, nondistended.  There is no organomegaly.  EXTREMITIES:  Demonstrated no cyanosis, clubbing, or edema.   EKG demonstrates sinus rhythm with an interventricular conduction delay and  a QRS duration varying between 120-140 milliseconds.   IMPRESSION:  1. 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  2. Ischemic cardiomyopathy, status post non-Q wave myocardial infarction,     likely secondary to ventricular tachycardia with tight vein graft     stenosis.  3. Dyslipidemia.  4. Hypertension.  5. Methicillin-resistant  Staphylococcus aureus in his sputum, unclear     clinical significance.   DISCUSSION:  The patient needs angioplasty to his vein graft, followed by  insertion of a defibrillator.  As the patient was previously working prior  to his initial presentation, and his heart failure symptoms prior to  admission were really between class I-II, I am not sure he will ultimately  require BiV defibrillator, although single chamber defibrillator or perhaps  dual chamber defibrillator would certainly need consideration.  Because the  patient will be on Plavix after his vein graft intervention, the risk of  bleeding complications will be somewhat increased however.                                               Doylene Canning. Ladona Ridgel, M.D.    GWT/MEDQ  D:  12/06/2003  T:  12/08/2003  Job:  [id number]   cc:   Thereasa Solo. Little, M.D.  [address]  [address]  [place]  [place] [address]  Fax: [phone]

## 2011-01-22 NOTE — Letter (Signed)
March 15, 2006     Thereasa Solo. Little, MD  [address]  [address]  [place], [place] [address]   RE:  [name], [name]  MRN #[id number]  /  DOB:  30-May-1956   Dear [name]:   [name] came in today for his device check.  I am seeing him in [facility].  He has put on, according to our scales, 60 pounds since last  September and maybe just 30 pounds since a year ago April.  I am not quite  sure about the weights.  In any case, he has significant bilateral  peripheral edema and some problems with shortness of breath.   His medications have been hard to manage because of his finances, and he is  currently not taking an ACE inhibitor.   His other medications include furosemide at 40, potassium at 20, Vytorin,  Coreg at 12.5, Aldactone, Lanoxin, Levoxyl, Niaspan, and Plavix.   On examination, his blood pressure was 135/75.  His pulse was 91.  His lungs  were clear.  Heart sounds were regular.  There is 3+ peripheral edema.   Interrogation of his Medtronic device demonstrates a P wave of 1.8 with an  impedance of 496, a threshold of 1 volt at 0.2 in the RA.  [name]  The RV  impedance was 432, with threshold of 1 volt at 0.3 and an R wave of 5.6.  He  is ventricularly paced not at all.   Review of electrocardiogram from April of 2006 demonstrates a QRS duration  of 130, with a PR interval of 225.  He does note that he was hospitalized  for heart failure in March, although it is not clear that he is in a class  III in general.   IMPRESSION:  1.  Ischemic heart disease.  2.  Congestive heart failure, probably class II, with issues of medical      noncompliance.  3.  Status post implantable cardioverter-defibrillator for monomorphic      ventricular tachycardia.  4.  Significant fluid overload.   I have taken the liberty of increasing his Lasix from 40 to 80 mg and  doubling his potassium.  I have asked that he follow up with you in the next  two weeks or so for follow-up of this.  I wonder  whether he might not be a  candidate for further up-titration of his Coreg and consideration of a  generic ACE inhibitor, maybe on the $4 list at [facility].   Al, thanks very much for allowing [place] to participate in his care.   Sincerely,     Duke Salvia, MD, [facility]   SCK/MedQ  DD:  03/15/2006  DT:  03/15/2006  Job #:  [id number]

## 2011-01-22 NOTE — Discharge Summary (Signed)
NAME:  Kevin, [name]                       ACCOUNT NO.:  [id number]   MEDICAL RECORD NO.:  [id number]                   PATIENT TYPE:  INP   LOCATION:  6533                                 FACILITY:  [facility]   PHYSICIAN:  Dani Gobble, MD                    DATE OF BIRTH:  1956-03-04   DATE OF ADMISSION:  11/30/2003  DATE OF DISCHARGE:  12/17/2003                                 DISCHARGE SUMMARY   ADMISSION DIAGNOSES:  1. History of hypertension.  2. History of hyperlipidemia.  3. History of coronary artery disease status post coronary artery bypass     grafting in 1992 with redo coronary artery bypass grafting in 2000.  4. Admission with acute respiratory failure requiring intubation, probably     secondary to acute pulmonary edema.  5. Status post non-Q-wave myocardial infarction at admission.  6. History of known chronic systolic heart failure with ejection fraction     25% at the time of catheterization in 2000.  7. History of hypothyroidism.  8. Noncompliance with medications.  9. Ventricular tachycardia as well as paroxysmal supraventricular     tachycardia at admission.   DISCHARGE DIAGNOSES:  1. History of hypertension.  2. History of hyperlipidemia.  3. History of coronary artery disease status post coronary artery bypass     grafting in 1992 with redo coronary artery bypass grafting in 2000.  4. Admission with acute respiratory failure requiring intubation, probably     secondary to acute pulmonary edema, improved and resolved, discharged     home.  5. Status post non-Q-wave myocardial infarction at admission.  6. History of known chronic systolic heart failure with ejection fraction     25% at the time of catheterization in 2000.  7. History of hypothyroidism.  8. Noncompliance with medications.  9. Ventricular tachycardia as well as paroxysmal supraventricular     tachycardia at admission.  10.      Methicillin-resistant staph aureus of the sputum, positive on  December 03, 2003.  11.      Gastrointestinal consult on December 04, 2003 with ileus. This had     resolved by time of discharge home.  12.      Status post cardiac catheterization by Dr. Caprice Kluver on December 06, 2003. This revealed significant coronary artery disease which would need     intervention, but he wanted to obtain infectious disease consult     evaluating methicillin-resistant staph aureus prior to any intervention.  13.      Status post infectious disease consultation on December 06, 2003 for     evaluation of methicillin-resistant staph aureus in the sputum.  14.      Status post electrophysiology consultation on December 06, 2003 for     evaluation of ventricular tachycardia which was monomorphic. They felt     that the patient needed ICD given  his ventricular tachycardia and history     of ischemic cardiomyopathy.  15.      Status post dual chamber ICD implantation on December 10, 2003.  16.      Status post lead revision on December 11, 2003 by Dr. Ladona Ridgel.  17.      Status post repeat cardiac catheterization on December 16, 2003 by Dr.     Nicki Guadalajara with percutaneous transluminal coronary angioplasty stent to     the vein graft to the obtuse marginal. He performed two stents to the     graft. He also performed percutaneous transluminal coronary angioplasty     to the native obtuse marginal after the anastomosis.   HISTORY OF PRESENT ILLNESS:  Mr. [name] is a 55 year old white male with a  history of hypertension, hyperlipidemia, and known CAD. He had a history of  initial CABG in 1992 and redo CABG in 2000. As well, he has had two prior  MIs. He was brought to the ER by EMS on November 29, 2003 at 1:30 a.m. At that  time, he was brought in secondary to acute onset shortness of breath. All of  his history was per his mother as the patient was immediately intubated with  frothy sputum noted in the tube. At that time, he had been given 80 of IV  Lasix with good ongoing response of  diuresis.   His mother reported he had been experiencing a dry hacking cough for the  last two weeks. There had been no fever or chills. No chest pain. Negative  shortness of breath otherwise and no lower extremity edema. He had basically  been in his usual state of health other than his dry cough. He had been  called back to work on the night of admission for a sprinkler system  problem, and upon his return, he called his mother who found him on the  steps on his knees saying I can't breathe. He did not mention any chest  pain.   He was then brought to [facility] ER where he was intubated and sedated. EKG  showed ST with anterolateral and inferior MI old. Nonspecific  interventricular conduction-like delay. The widened QRS was new. Other  findings were unchanged. Troponin at that time was 0.84, BNP 570, creatinine  1.5, hemoglobin 16. Chest x-ray was pending. On exam, his blood pressure was  elevated at 187/88 and came to 167 soon thereafter. Initial heart rate was  166; it came to 103 after Lopressor IV x3. At that point, he was on IV  nitroglycerin and sedation protocol.   The mother reports that he is out of some of his medicines, but she does  not know which ones or for how long.   At that time, records were reviewed showing his last catheterization in  2000. At that time, his EF was 25%. He subsequently underwent redo CABG.   At that point, he was seen and evaluated by Dr. Domingo Sep. At that time, she  was uncertain whether this was a new acute cardiac ischemic event or whether  this was new onset CHF, possibly exacerbated by uncontrolled hypertension  and/or recent medication noncompliance. At that point, she planned for  admission to ICU, rule out MI protocol, follow serial enzymes, restart  medications, pulmonary consult for vent management. It was felt that he  would likely need repeat catheterization at some point. We will have to stabilize and diurese first. He would be  started on IV heparin, IV  nitroglycerin, aspirin, IV beta blocker. We will check a 2-D echocardiogram.  At this point, blood pressure was improved, down to 127/72, heart rate 96.  It was felt that if he did not have a good response to Lasix, we would add  Natrecor.   HOSPITAL COURSE:  The morning of November 30, 2003, he remained intubated. He  was continued on IV Lopressor and other medications as above. No change.   On November 30, 2003, pulmonary consultation was obtained, and they were  managing the vent.   On December 01, 2003, he was continued on IV heparin and IV nitroglycerin. Dr.  Elsie Lincoln felt that we should not add 2B3As at this point since there was no  plan for catheterization in the next 72 hours. At that time, his abdomen was  distended but not tender, and Dr. Elsie Lincoln could not hear any bowel sounds. At  that point, he planned for NG tube insertion, p.o. medications per NG tube.   He was continued on the same treatments over the next couple of days.   They were able to wean him off of the vent on the morning of December 02, 2003.  He had no problems post extubation at that point.   On December 03, 2003, sputum culture showed positive MRSA.   On December 03, 2003, he underwent 2-D echocardiogram. This showed EF 10 to  20%, mild MR.   On December 04, 2003, we obtained a GI consultation for evaluation of ileus. It  was felt that he did have an ileus. It was felt this may be precipitated by  ischemia but at this point do not think there is any ongoing mesenteric  ischemia. Clinically, his ileus appeared to be resolved. They planned to  clamp NG tube over 6 hours. If no further nausea or vomiting, we could stop  serial abdominal exams and Hemoccults.   By December 05, 2003, he was passing flatulence, belching. NG tube was pulled  with no nausea and vomiting. Still with some abdominal distention. Plan for  liquid diet and not to advance further until less distention. Will continue  Reglan and  Dulcolax.   On December 05, 2003, he had significant bradycardias and pauses in asystole.  He had a 4.4 second pause followed by a 2.6 second pause soon thereafter.   He was seen by Dr. Clarene Duke the following day. He planned that the patient  would probably need a BiV pacemaker as well as an ICD, and we would obtain  EP evaluation in the next day.   On December 06, 2003, he underwent cardiac catheterization by Dr. Caprice Kluver. He  was found to have significant disease, especially in the vein graft to the  OM. Please see Dr. Fredirick Maudlin dictated report for detail. It was felt that he  did require intervention given his coronary anatomy; however, given his  recent positive MRSA cultures, we would get an ID consult prior to placing  any stents.   On December 06, 2003, he underwent infectious disease consultation for  evaluation of positive MRSA sputum.   They suspected that the wound is colonized with MRSA. It is possible he had hospital-acquired pneumonia with MRSA; however, his chest x-rays looked more  like failure. They felt that his current antibiotic treatment was more than  adequate.   On December 06, 2003, we obtained EP consultation. He was seen by Dr. Lewayne Bunting. At that point, it was felt that he had had monomorphic ventricular  tachycardia with right  bundle branch morphology. As well, they reviewed his  coronary artery disease history as well as his LV dysfunction, EF 10 to 20%.  They agreed that the patient would require ICD and pacer. They were  uncertain whether we should do this first or the intervention first. At that  point, it was not yet scheduled.   Over the next couple of days, he was seen by Dr. Clarene Duke. He was still having  some pauses and bradycardias in the 50s. Dr. Clarene Duke had spoken with Dr.  Ladona Ridgel, and they planned for ICD BiV pacemaker over the following week.   Over the next several days, he required temporary wire secondary to  bradycardia with long sinus pauses. At that  point, Dr. Ladona Ridgel had spoken  with Dr. Clarene Duke regarding scheduling, and they decided to place the ICD on  December 10, 2003.   On December 10, 2003, he underwent successful dual chamber ICD implantation by  Dr. Ladona Ridgel.   On December 11, 2003, he required atrial lead. It was found that he had an  atrial lead dislodgement. Therefore, that same day, he underwent successful  atrial lead revision with acceptable pacing and sensing post procedure.   On December 12, 2003, Mr. Morren was stable and doing well. We felt that since  his coronary intervention may require 2B3As and high anticoagulation and the  fact that he just had his pacer ICD implanted with a need for revision that  we should wait until the upcoming Monday to do any coronary intervention  given the need for anticoagulation.   We continued current therapy through the weekend with no significant  changes. He remained stable with no significant complications over the next  several days.   On December 16, 2003, he underwent repeat catheterization by Dr. Nicki Guadalajara.  He performed PTCA and stenting to the mid and distal saphenous vein graft to  the OM vessel. As well, he performed PTCA only to the small OM vessel which  was distal to the anastomosis of the vein graft site. See his dictated  report for details. The patient tolerated the procedure well without  complication.   On the morning of December 17, 2003, Mr. Grieger is doing well without  significant complaint. We have reviewed that he does have a low grade fever  at 99, but his white count is stable at 11.9, and this was reviewed by Dr.  Tresa Endo and is felt to be stable and okay for discharge. His pulses are in the  80s at this point. Blood pressure is 115 to 145 systolic. His labs were  stable. White count 11.9, hemoglobin 11.7, hematocrit 34.3, platelets 433.  Sodium 132, potassium 4.4, BUN 9, creatinine 1.0. Telemetry showed sinus rhythm with intermittent pacing. Lungs showed no rales,  fairly clear. Heart  is in regular rhythm with 1/6 murmur. His groin site is stable, and he has  no lower extremity edema.   At this point, he was seen and evaluated by Dr. Nicki Guadalajara who deemed him  stable for discharge home.   HOSPITAL CONSULTS:  1. On November 30, 2003, he underwent pulmonary critical care consultation for     vent management. At that point, he had ventilator-dependent respiratory     failure secondary to acute pulmonary edema.  2. GI consultation on December 04, 2003 by Dr. Arlyce Dice for evaluation of ileus.  3. Infectious disease consultation on December 06, 2003 for evaluation of     positive MRSA in the sputum.  4.  Electrophysiology consultation on December 06, 2003 by Dr. Lewayne Bunting for     evaluation of sustained monomorphic ventricular tachycardia,     bradycardias, and significant pauses; EF 10 to 20%; CAD.   HOSPITAL PROCEDURES:  1. He underwent diagnostic cardiac catheterization on December 06, 2003 by Dr.     Caprice Kluver.  2. He underwent cardiac catheterization intervention to the SVG to OM vessel     on December 16, 2003 by Dr. Nicki Guadalajara.  3. He underwent BiV ICD implant on December 10, 2003 by Dr. Lewayne Bunting.  4. He underwent status post atrial lead revision on December 11, 2003 by Dr.     Lewayne Bunting.   LABORATORY DATA:  Around the time of admission, we checked his TSH. It was  elevated on two different accounts. At one time, it was at 6.308; on repeat,  it was 9.525; however upon questioning the patient, he says that he had not  been taking his Synthroid for several months' time prior to his admission.   Also near admission, we checked a lipid profile. This showed a total  cholesterol of 202, triglycerides 72, HDL 25, LDL 163.   At time of admission, BNP was 569.9. First set of enzymes showed CK 131, MB  4.8, troponin 0.61. On admission, sodium 138, potassium 4.2, BUN 18,  creatinine 1.3. Liver function tests show a SGOT of 57, SGPT 51, other LFTs  normal. Second set  of enzymes shows CK 270, MB 19.3, troponin 3.56. Next set  shows CK 307, MB 23.9, troponin 3.63. Blood cultures on March 31 show no  growth in 5 days. For other labs, see the printout. Those were the  significant lab findings. Again at the time of discharge home on December 17, 2003, labs are stable with sodium 132, potassium 4.4, glucose 91, BUN 29,  creatinine 1.0. White count 11.9, hemoglobin 11.7, hematocrit 34.3,  platelets 433.   He underwent 2-D echocardiogram on December 03, 2003. EF was found to be 10 to  20%. There was mild MR.   Chest x-ray on admission on March 26 shows:  Pulmonary edema with  cardiomegaly. Satisfactory position of endotracheal tube. Chest x-ray December 02, 2003 shows mild stable CHF. Abdominal films on December 03, 2003 shows  persistent bowel distention compatible with ileus. Repeat on March 30 shows  diffuse bowel ileus, insertion of NG tube. Last x-ray was performed on December 16, 2003 which shows cardiomegaly, no infiltrates or edematous changes, no  evidence for active chest disease. It shows implantable defibrillator with  right atrial and ventricular leads which appeared in satisfactory position  and unchanged.   EKG on admission shows sinus tachycardia with anterolateral and inferior MI  which was old. There was left atrial enlargement, nonspecific  interventricular conduction delay. It was felt that the wide QRS was new,  but otherwise the findings were unchanged. Telemetry through the  hospitalization was significant for significant bradycardia and significant  pauses which required temporary pacing and ultimately permanent pacing.   DISCHARGE MEDICATIONS:  Please note that the patient had stopped many of his  medications over the last several months. Specifically, his TSH is elevated  in the hospital. I called his pharmacy who stated that he had been on  Levoxyl 25 mcg a day but that it was last filled in July 2004 for only  month's supply. Therefore, the  patient was not taking this medication  whatsoever at the time of his admission at the  time of his elevated TSH.   We are stopping his previous medicines including his Toprol, Cozaar, and  Pravachol. We are reinitiating his Levoxyl at 25 mcg once a day, and he will  need to followup with his primary care physician in one week regarding this.   All of the following medications are new:  1. Lanoxin 0.125 mg once a day.  2. Altace 2.5 mg twice a day.  3. Coreg 6.25 twice a day.  4. Lasix 40 mg once a day.  5. K-Dur 40 mEq once a day.  6. Protonix 40 mg once a day.  7. Aspirin 81 mg once a day.  8. Plavix 75 mg once a day.  9. Nitroglycerin 0.4 mg sublingual as directed.  10.      Lipitor 40 mg each night.  11.      Niaspan 500 mg each night.  12.      Levoxyl 25 mcg a day.   ACTIVITY:  No strenuous activity until you see Dr. Clarene Duke. No lifting  greater than 5 pounds or driving for 3 days.   SPECIAL INSTRUCTIONS:  EP has reviewed pacer discharge instructions.   FOLLOW UP:  He has an appointment to follow up with Dr. Ladona Ridgel on July 13 at  10:40. He has an appointment to see Dr. Clarene Duke April 19 at 12 p.m. He is to  follow up with primary care physician in the next couple of weeks  _____________ other medical issues.   He is to have blood drawn in one week to check a BMET.      Mary B. Easley, P.A.-C.                   Dani Gobble, MD    MBE/MEDQ  D:  12/17/2003  T:  12/18/2003  Job:  [id number]   cc:   Thereasa Solo. Little, M.D.  [address]  [address]  [place]  [place] [address]  Fax: [phone]   Doylene Canning. Ladona Ridgel, M.D.   Duke Salvia, M.D.   Shan Levans, M.D. [facility]   Bruce H. Swords, M.D. [facility]   Malcolm T. Russella Dar, M.D. [facility]   Infectious disease   [facility]

## 2011-01-22 NOTE — Cardiovascular Report (Signed)
[name], [name]             ACCOUNT NO.:  [id number]   MEDICAL RECORD NO.:  [id number]          PATIENT TYPE:  OIB   LOCATION:  2899                         FACILITY:  [facility]   PHYSICIAN:  Darlin Priestly, MD  DATE OF BIRTH:  [birth year]   DATE OF PROCEDURE:  09/24/2004  DATE OF DISCHARGE:                              CARDIAC CATHETERIZATION   PROCEDURES:  1.  Left heart catheterization.  2.  Coronary angiography.  3.  Left ventriculogram.  4.  Saphenous vein grafts.   ATTENDING:  Darlin Priestly, M.D.   COMPLICATIONS:  None.   INDICATIONS:  [name] is a 55 year old male patient of Dr. Verta Ellen  and Dr. Caprice Kluver with a history of CAD status post coronary artery bypass  surgery in 1992 consisting of LIMA to LAD, vein graft to the diagonal,  sequential vein graft to OM and OM-2 and vein graft to RCA.  Patient  underwent repeat catheterization in 2000 with total occlusion of the vein  graft to the LAD as well as total occlusion of vein graft to the RCA and a  LIMA which appeared to be a grafted into a small diagonal.  He underwent re-  do bypass surgery in 2000 consisting of free left radial to distal LAD, vein  graft to OM and vein graft to RCA.  Since that bypass he has had multiple  repeat catheterizations documented including the vein graft to the RCA with  diffuse disease of the vein graft to the OM.  He underwent PTCA and stenting  of the vein graft to the OM by Dr. Daphene Jaeger in April, 2005, with subsequent  PTCA and stenting of the vein graft in November, 2005 by Dr. Caprice Kluver.  He  was readmitted with recurrent chest pain and underwent repeat  catheterization on August 11, 2004, revealing instent restenosis of the  previously placed TAXUS stent with angioplasty of the instent restenosis at  that time.  Since his last catheterization, he has undergone repeat  Cardiolite scan revealing extensive lateral ischemia.  He is now brought for  repeat  catheterization to reassess the vein graft to the OM.   DESCRIPTION OF OPERATION:  After giving informed consent, the patient was  brought to the cardiac cath lab, the right and left groin were shaved,  prepped and draped in the usual sterile fashion.  ECG monitoring was  established.  Using the modified Seldinger technique, a #French arterial  sheath was inserted in the right femoral artery.  A 6 French diagnostic  catheter was then used to perform diagnostic angiography.   The left main is a large vessel with 60% distal narrowing.   The LAD is a medium-sized vessel which gave rise to one small diagonal  branch and is totally occluded, as well as giving rise to one large septal  perforator.  There is a radial graft to the distal portion of the LAD which  appears to be widely patent.  There are left-to-left collaterals as well as  left-to-right collaterals of distal PDA and posterolateral branch.   The circumflex is totally occluded in its  proximal portion.  The distal AV  groove circumflex as well as several lower obtuse marginals fill via left-to-  left collaterals from the LAD.   There is a patent saphenous vein graft with insertion into the midportion of  the first OM.  There are three sequential stents noted in the mid and distal  portion of the graft.  The first stent is noted to have 40% instent  restenosis.  The second and third stents appear to be widely patent.  There  does not appear to be significant disease distal to the graft insertion,  with again left-to-left collaterals to the lower obtuse marginal as well as  AV groove.   The right coronary artery is a medium sized vessel which is totally occluded  in its midportion.  There are right-to-right collaterals filling the distal  RCA as well as the PDA and posterolateral branch.  There is 60% diffuse  disease in the distal RCA with 90% disease in the distal PDA.   The vein graft to the RCA was found to be totally  occluded.   The LIMA graft from previous angiograms appears to insert on a small  diagonal branch but does not fill the LAD.   Left ventriculogram reveals a severely depressed EF of 10-15% with severe  global hypokinesis and severely dilated left ventricle.   CONCLUSIONS:  1.  Significant left main and three vessel coronary artery disease.  2.  Patent radial graft to the distal left anterior descending.  3.  Patent vein graft to the obtuse marginal with 40% instent restenosis of      the proximal TAXUS stent.  4.  Nontotal occlusion of the saphenous vein graft to the RCA.  5.  Severely depressed LV systolic function estimated at approximately 15%.  6.  Elevated LVEDP.      RHM/MEDQ  D:  09/24/2004  T:  09/24/2004  Job:  [id number]   cc:   Thereasa Solo. Little, M.D.  [address]  [address]  [place]  [place] [address]  Fax: [phone]   Valetta Mole. Swords, M.D. [facility]

## 2011-01-22 NOTE — Op Note (Signed)
NAME:  [name], [name]                       ACCOUNT NO.:  [id number]   MEDICAL RECORD NO.:  [id number]                   PATIENT TYPE:  INP   LOCATION:  2910                                 FACILITY:  [facility]   PHYSICIAN:  Duke Salvia, M.D.               DATE OF BIRTH:  25-May-1956   DATE OF PROCEDURE:  12/10/2003  DATE OF DISCHARGE:                                 OPERATIVE REPORT   PREOPERATIVE DIAGNOSES:  1. 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  2. Decreased left ventricular function.  3. Class II heart failure.   POSTOPERATIVE DIAGNOSES:  1. 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  2. Decreased left ventricular function.  3. Class II heart failure.   PROCEDURE:  Dual-chamber defibrillator implantation with intraoperative  defibrillation threshold testing.   Following the obtaining of informed consent, the patient was brought to the  electrophysiology laboratory and placed on the fluoroscopic table in the  supine position.  After routine prep and drape of the left upper chest,  lidocaine was infiltrated in the prepectoral subclavicular region.  An  incision was made and carried down to the layer of the prepectoral fascia  using electrocautery and sharp dissection.  A pocket was formed similarly.  Hemostasis was obtained.   Thereafter, attention was turned to gaining access to the extrathoracic left  subclavian vein, which was accomplished without difficulty and without the  aspiration of air or puncture of the artery.  Two separate venipunctures  were accomplished and a 0 silk suture was placed in a figure-of-eight  fashion, allowed to hang loosely.   Subsequently 7 French tear-away introducer sheaths were placed, through  which were then passed a Medtronic 6949 65-cm active-fixation dual-coil  defibrillator lead, serial number [id number].  Under fluoroscopic  guidance  it was manipulated to the right ventricular apex, where the bipolar R-wave  was 7.8 millivolts with a pacing impedance of 1105 Ohms and a threshold of 1  V at 0.4 msec with a current of threshold of 1.5 mA.  This lead was then  secured to the pectoral fascia, and over the second guidewire a 7 French  sheath was placed, through which was then passed the Medtronic 5076 52-cm  active-fixation atrial lead, serial number [id number].  Under fluoroscopic  guidance it was manipulated to the right atrial appendage, where the bipolar  P-wave was 3.2 mV with a pacing impedance of 663 Ohms and a pacing threshold  shortly after lead deployment of 1.3 V at 0.5 msec and a current at  threshold was 2.8 mA.  This lead was also secured to the prepectoral fascia,  and then the leads were attached to a Medtronic Maximo [id number] ICD, serial  number [id number].  Through the device the bipolar P-wave was 2 mV with a  pacing impedance of 552 Ohms, a pacing  threshold of 1.0 V at 0.3 msec, and  the bipolar R-wave was 11.9 mV with an impedance of 712 Ohms and a pacing  threshold of 1 V at 0.2 msec.  The high voltage impedance was 69 Ohms, and  the proximal coil impedance was 70 Ohms.  The pocket was copiously irrigated  with antibiotic-containing saline solution, hemostasis was assured, and the  leads and the pulse generator were then placed in the pocket and secured to  the prepectoral fascia. Ventricular fibrillation was induced via the T-wave  shock.  After a total duration of eight seconds, a 15.3 joule shock was  delivered through a measured resistance of 53 Ohms, terminating ventricular  fibrillation and restoring sinus rhythm.  There were a number of drop-outs  at 1.2 mV sensitivity.   The device was reprogrammed to 0.9 mV sensitivity. Ventricular fibrillation  was induced again via the T-wave shock.  After a total duration of seven to  eight seconds, a 15-joule shock was again delivered through a  measured  resistance of 54 Ohms, terminating ventricular fibrillation and restoring an  AV paced rhythm.  Again there was some drop-out, although it was improved at  0.9 mV.  Ventricular lead integrity was reassessed.  It was stable, and it  was elected to leave the device as it was given the underlying sensitivity  during nominal programming at 0.3 mV.  The wound was then closed in three  layers in the normal fashion.  The wound was washed, dried, and a Benzoin  and Steri-Strip dressing was applied.  Needle counts, sponge counts, and  instrument counts were correct at the end of the procedure according to the  staff.   The patient tolerated the procedure without apparent complication.                                               Duke Salvia, M.D.    SCK/MEDQ  D:  12/10/2003  T:  12/11/2003  Job:  [id number]   cc:   Thereasa Solo. Little, M.D.  [address]  [address]  [place]  [place] [address]  Fax: [phone]   Attention Janifer [facility]   Electrophysiology Laboratory   [facility]

## 2011-01-22 NOTE — Cardiovascular Report (Signed)
NAME:  [name], [name]                       ACCOUNT NO.:  [id number]   MEDICAL RECORD NO.:  [id number]                   PATIENT TYPE:  INP   LOCATION:  6533                                 FACILITY:  [facility]   PHYSICIAN:  Nicki Guadalajara, M.D.                  DATE OF BIRTH:  1956-02-18   DATE OF PROCEDURE:  12/16/2003  DATE OF DISCHARGE:                              CARDIAC CATHETERIZATION   PROCEDURE:  Percutaneous coronary intervention of the saphenous vein graft  to the circumflex marginal vessel with percutaneous transluminal coronary  angioplasty stenting in the mid portion of the graft, percutaneous  transluminal coronary angioplasty stenting at the anastomosis of the graft  into the obtuse marginal vessel and percutaneous transluminal coronary  angioplasty of the distal aspect of the obtuse marginal vessel.   INDICATIONS:  [name] is a 55 year old gentleman whose birthday  is today.  He is a patient of Dr. Clarene Duke.  He underwent bypass surgery in  2000 with redo in 2002.  In March 2005, he presented with acute pulmonary  edema and non-Q-wave myocardial infarction and also was found to have wide  complex tachycardia felt to be due to ventricular tachycardia.  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  He underwent diagnostic catheterization on December 06, 2003 by Dr. Clarene Duke.  This showed severe native coronary artery disease with total occlusion of  his circumflex and right coronary artery, 70% terminal left main stenosis,  80% proximal LAD stenosis and total occlusion of the LAD after the septal  perforator artery.  Saphenous vein graft to the right coronary artery was  100% occluded.  Saphenous vein graft to the circumflex marginal vessel had  high grade stenoses in its mid segment and then had diffuse irregularity  distally in apparently very small  distal marginal vessel.  His LIMA was  patent as was his free radial artery to his LAD.  The patient did not  undergo intervention at that time.  He was treated with additional  antibiotics until clearing of his methicillin resistant Staph.  He  subsequently underwent ICD implantation.  The patient is now scheduled to  undergo intervention to his circumflex system via the vein graft.   DESCRIPTION OF PROCEDURE:  After premedication with Versed 2 mg  intravenously, the patient was prepped and draped in the usual fashion.  A 7  French sheath was used for the procedure and initially a 7 [place] left  bypass grade guide was used.  However, this did not have adequate backup and  ultimately this was changed to a 7 [place] right coronary catheter.  Initial  angiography showed subtotal occlusion with 99+% stenosis in the mid body of  the graft with TIMI-1 flow.  The distal graft appeared diffusely diseased,  particularly at the anastomosis and there was hardly any flow into the  obtuse marginal vessel.  The patient was treated with double bolus  integrilin and weight-adjusted heparinization and also was given 600 mg of  oral Plavix.  An ATW wire was advanced down the circumflex graft, but was  unable to cross the 99+% mid graft stenosis.  A 2.0 x 15-mm Maverick balloon  was then inserted which provided backup support and ultimately the wire was  able to cross the subtotal stenosis.  The wire was then advanced to the  distal circumflex marginal vessel.  Dilatation was done at the mid site with  the 2.0 x 15-mm Maverick balloon up to 10 atmospheres.  Once flow improved,  it was apparent that there was at least 80% stenosis at the anastomosis of  the graft into the marginal vessel.  Dilatation was done with this 2.0  balloon.  Once this was open, this appeared to be moderate size vessel.  However, there did appear to be diffuse 90% stenosis in the distal aspect of  the marginal vessel which was very  small caliber vessel.  The 2.0 balloon  was advanced to this segment and percutaneous transluminal coronary  angioplasty was performed at this site to 5 atmospheres.  It was felt that  percutaneous transluminal coronary angioplasty alone would be necessary for  the more distal aspect.  However, the anastomosis site after dilatation was  significantly larger in size.  Multiple doses of IC nitroglycerin were also  administered.  A 2.5 x 18-mm drug-eluting stent Cypher stent was  successfully deployed at this site and dilated up to 16 atmospheres.  This  balloon was then removed and a 3.0 x 28-mm Cypher stent was then  successfully deployed in the mid segment which covered several areas of  diffuse stenosis beyond the 99% stenosis.  This was dilated up to 17  atmospheres.  Post stent dilatation was done utilizing a 3.25 x 15-mm  Quantum balloon with dilatation up to 3.25 mm diffusely in the 28-mm Cypher  stent.  At this point, the wire had pulled back just above the distal  stented segment and due to the significant bends on the wire I was unable to  cross the distal stented site for post dilatation.  The balloon and wire  were then removed and a new Luge wire was advanced down into the distal  circumflex.  The 3.25 balloon was then used for post stent dilatation  distally with low atmospheres up to approximately 5 atmospheres  corresponding to a 2.95 mm size.  Scout angiography confirmed an excellent  angiographic result at all sites with resumption of TIMI-3 flow and no  evidence for dissection.  The patient tolerated the procedure well.  ACT was  documented to be therapeutic.  Plans are for sheath removal later today.   HEMODYNAMIC DATA:  Central aortic pressure was 105/70, mean 85.   ANGIOGRAPHIC DATA:  At the start of the intervention, the saphenous vein  graft supplying the circumflex marginal vessel was diffusely diseased. There was 30-40% narrowing in the proximal third of the graft  and in the mid  portion of the graft the vessel was 99+% stenosed.  There was TIMI-1 flow  beyond this segment, but the segment immediately beyond this appeared  diffusely diseased as was the anastomosis site.  There was hardly any  filling in the native marginal vessel from the initial injections.  Following successful percutaneous transluminal coronary angioplasty and  stenting of all three sites, the initial 99% stenosis followed by diffuse  irregularity was  reduced to 0% with the 3.0 x 28-mm Cypher stent post  dilated to 3.25 mm in its entirety.  At the anastomosis, the 80% stenosis  was reduced to 0% with the 2.5 x 18-mm drug-eluting stent Cypher stent post  dilated to 2.95 mm.  The most distal very small OM vessel had 90% initial  stenosis which underwent successful percutaneous transluminal coronary  angioplasty and was narrowed to 20 to less than 30%.  There was TIMI-3 flow.  There was no evidence for dissection.   IMPRESSION:  Complex successful three lesion intervention involving the  saphenous vein graft to the circumflex marginal vessel and distal obtuse  marginal vessel with percutaneous transluminal coronary angioplasty stenting  of a 99% stenosis followed by 70% stenoses in the mid portion of the graft  utilizing a 3.0 x 28-mm Cypher stent dilated to 3.25 mm; percutaneous  transluminal coronary angioplasty and stenting of the anastomosis of the  vein graft into the obtuse marginal vessel with a 2.5 x 18-mm Cypher stent  post dilated to 2.95 mm, and percutaneous transluminal coronary angioplasty  of the most distal obtuse marginal vessel with the 90% stenosis being  reduced to 20 to less than 30% done with double bolus integrilin/weight-  adjusted heparinization.                                               Nicki Guadalajara, M.D.    TK/MEDQ  D:  12/16/2003  T:  12/17/2003  Job:  [id number]   cc:   Thereasa Solo. Little, M.D.  [address]  [address]  [place]  [place] [address]  Fax:  [phone]   Orville Govern

## 2011-04-17 IMAGING — CR DG CHEST 1V PORT
1 series · 1 of 1 positions shown · non-contrast
Comparison: 04/16/2009 and earlier.

CLINICAL DATA: 53-year-old male with chest pain.

PORTABLE CHEST - 1 VIEW

[AP]
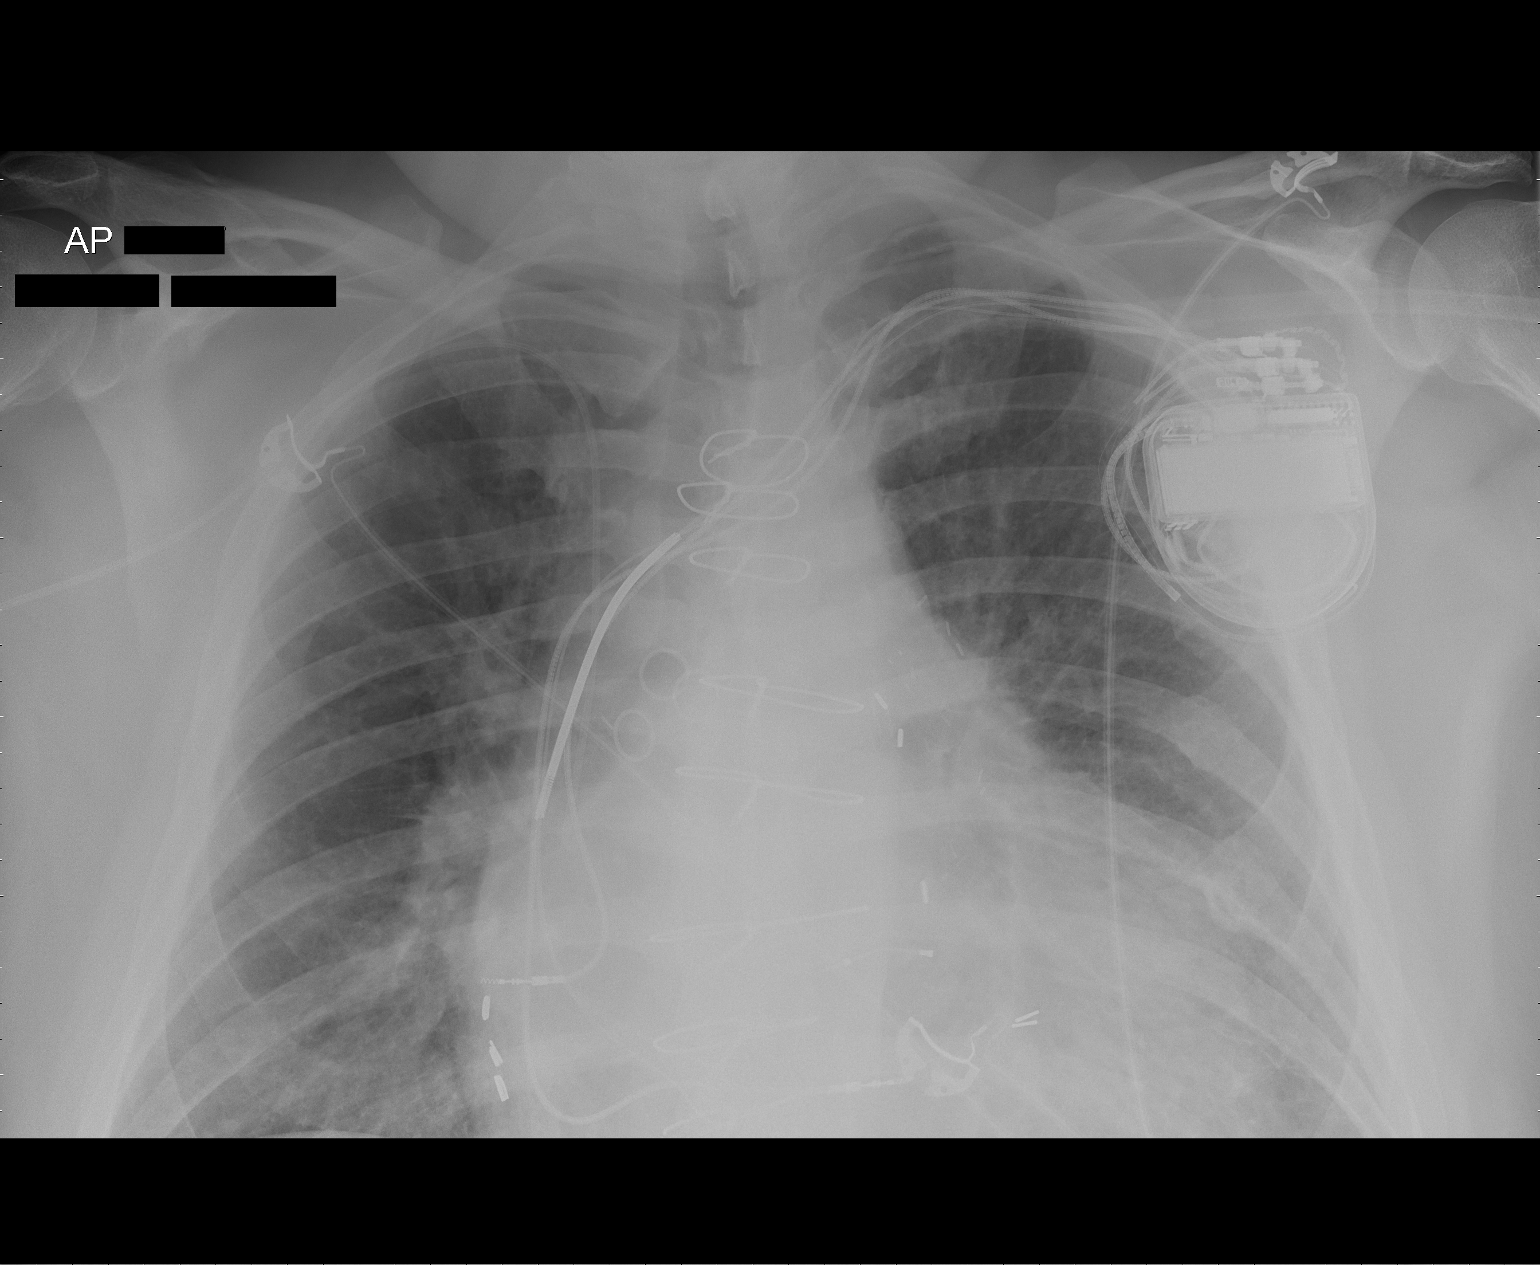

[1 of 1 positions shown; findings below may reference images not displayed]

FINDINGS: AP portable seated upright view at 7072 hours. Stable
cardiomegaly and mediastinal contours.  Stable sequelae of CABG and
left chest cardiac AICD.  Mildly improved ventilation with mild
atelectasis.  No pneumothorax, pulmonary edema, or pleural
effusion. Stable right PICC line.
IMPRESSION: Improved ventilation.  Mild atelectasis.

## 2011-07-02 ENCOUNTER — Encounter: Payer: Self-pay | Admitting: Family Medicine

## 2011-07-02 ENCOUNTER — Ambulatory Visit (INDEPENDENT_AMBULATORY_CARE_PROVIDER_SITE_OTHER): Payer: 59 | Admitting: Family Medicine

## 2011-07-02 VITALS — Temp 98.8°F | Wt 245.0 lb

## 2011-07-02 DIAGNOSIS — R21 Rash and other nonspecific skin eruption: Secondary | ICD-10-CM

## 2011-07-02 DIAGNOSIS — Z23 Encounter for immunization: Secondary | ICD-10-CM

## 2011-07-02 NOTE — Patient Instructions (Addendum)
Follow up immediately for fever or recurrent rash.

## 2011-07-02 NOTE — Progress Notes (Signed)
  Subjective:    Patient ID: [name], male    DOB: [birth year], 54 y.o.   MRN: [id number]  HPI  Patient seen with onset one week ago of painful rash posterior aspect left calf. He noted fever 101.8 Sunday and none since then. Rash is gradually improved since then. He described this as erythematous. No pustular center and no vesicles. Denied injury. Denies other areas of rash.  Multiple chronic problems including type 2 diabetes, obesity, CAD history ventricular tachycardia, systolic heart failure with left ventricular assistive device 2 years ago in [place]. Needs flu vaccine   Review of Systems  Constitutional: Negative for fever and chills.  Respiratory: Negative for shortness of breath and wheezing.   Cardiovascular: Negative for chest pain.  Skin: Positive for rash.       Objective:   Physical Exam  Constitutional: He appears well-developed and well-nourished.  Cardiovascular:       Patient has left ventricular assistive device with constant high-pitched humming noise in chest  Pulmonary/Chest: Breath sounds normal. No respiratory distress. He has no wheezes. He has no rales.  Neurological: He is alert.  Skin:       Patient has nonspecific erythematous rash left posterior calf. No pustules. No vesicles. Couple small papules. Covers an area about 3 x 4 cm. Nonfluctuant.          Assessment & Plan:  #1 resolving rash left posterior calf. This is not compatible with shingles. No evidence for abscess. Doubt staph. This appears to be resolving. Observe for now. No evidence for cellulitis. #2 history of CAD and heart failure with left ventricular assistive device. Flu vaccine given.

## 2011-09-14 ENCOUNTER — Ambulatory Visit (INDEPENDENT_AMBULATORY_CARE_PROVIDER_SITE_OTHER): Payer: 59 | Admitting: Internal Medicine

## 2011-09-14 DIAGNOSIS — Z23 Encounter for immunization: Secondary | ICD-10-CM

## 2011-09-30 ENCOUNTER — Encounter: Payer: Self-pay | Admitting: *Deleted

## 2011-10-14 ENCOUNTER — Encounter: Payer: Self-pay | Admitting: *Deleted

## 2012-01-31 ENCOUNTER — Telehealth: Payer: Self-pay | Admitting: Internal Medicine

## 2012-01-31 ENCOUNTER — Encounter: Payer: Self-pay | Admitting: Internal Medicine

## 2012-01-31 NOTE — Telephone Encounter (Addendum)
01-31-12 sent past due letter, to set up device check with taylor/mt 08-25-12 lmm @ 414pm for pt to set up past due defib ck with taylor/mt

## 2012-02-14 ENCOUNTER — Ambulatory Visit (INDEPENDENT_AMBULATORY_CARE_PROVIDER_SITE_OTHER): Payer: 59 | Admitting: Internal Medicine

## 2012-02-14 ENCOUNTER — Encounter: Payer: Self-pay | Admitting: Internal Medicine

## 2012-02-14 VITALS — Temp 97.9°F | Ht 68.0 in | Wt 248.0 lb

## 2012-02-14 DIAGNOSIS — S8010XA Contusion of unspecified lower leg, initial encounter: Secondary | ICD-10-CM

## 2012-02-14 NOTE — Progress Notes (Signed)
Patient ID: [name], male   DOB: 05-25-1956, 56 y.o.   MRN: [id number]  Feels well Has lvad On warfarin Has noted a knot posterior aspect of right knee- some bruising, no trauma  Past Medical History  Diagnosis Date  . DIABETES MELLITUS, TYPE II 12/13/2008  . HYPERLIPIDEMIA 04/25/2007  . OBESITY 02/26/2009  . ANEMIA, OTHER UNSPEC 04/03/2008  . MYOCARDIAL INFARCTION, 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  . CORONARY ARTERY DISEASE 04/25/2007  . 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  . VENTRICULAR TACHYCARDIA 02/26/2009  . Acute on chronic systolic heart failure 03/02/2009  . Abdominal pain, epigastric 03/31/2009  . THYROID DISEASE, HX OF 05/14/2008  . HYPERTENSION, HX OF 05/14/2008  . AUTOMATIC IMPLANTABLE CARDIAC DEFIBRILLATOR SITU 02/26/2009    History   Social History  . Marital Status: Single    Spouse Name: N/A    Number of Children: N/A  . Years of Education: N/A   Occupational History  . Not on file.   Social History Main Topics  . Smoking status: Former Smoker -- 1.0 packs/day for 20 years    Types: Cigarettes    Quit date: 07/02/1999  . Smokeless tobacco: Not on file  . Alcohol Use: Not on file  . Drug Use: Not on file  . Sexually Active: Not on file   Other Topics Concern  . Not on file   Social History Narrative  . No narrative on file    Past Surgical History  Procedure Date  . Corn[place] bypass     artery graft  . Cardiac catheterization   . Left ventricular assist device 05/2009    [facility]    Family History  Problem Relation Age of Onset  . Heart disease Mother   . Cancer Other     colon    No Known Allergies  Current Outpatient Prescriptions on File Prior to Visit  Medication Sig Dispense Refill  . atorvastatin (LIPITOR) 80 MG tablet Take 80 mg by mouth daily.       . carvedilol (COREG) 3.125 MG tablet Take 3.125 mg by mouth 2 (two) times daily with a meal.       . enalapril (VASOTEC) 10 MG tablet Take 10 mg by mouth 2 (two) times daily.       [name] levothyroxine  (SYNTHROID, LEVOTHROID) 75 MCG tablet       . NITROSTAT 0.4 MG SL tablet Take 1 tablet by mouth Every 5 minutes.      [name] omeprazole (PRILOSEC) 20 MG capsule Take 20 mg by mouth daily.       [name] warfarin (COUMADIN) 1 MG tablet As directed daily      . warfarin (COUMADIN) 5 MG tablet 9 mg Sat, Sun and 10 mg all others         patient denies chest pain, shortness of breath, orthopnea. Denies lower extremity edema, abdominal pain, change in appetite, change in bowel movements. Patient denies rashes, musculoskeletal complaints. No other specific complaints in a complete review of systems.   Temp(Src) 97.9 F (36.6 C) (Oral)  Ht 5\' 8"  (1.727 m)  Wt 248 lb (112.492 kg)  BMI 37.71 kg/m2  has 10 cm area of bruising, posterior aspect of right knee (proximal) Central mass- approx 2cm   Hematoma- likely from warfarin-- INR managed by [facility]- had checked 3 days ago with dose adjusted.  He has f/u this week for warfarin.

## 2012-07-18 HISTORY — PX: HEART TRANSPLANT: SHX268

## 2013-01-11 ENCOUNTER — Encounter (HOSPITAL_COMMUNITY)
Admission: RE | Admit: 2013-01-11 | Discharge: 2013-01-11 | Disposition: A | Discharge status: Home / Self Care | Payer: Medicare HMO | Source: Ambulatory Visit | Attending: Cardiology | Admitting: Cardiology

## 2013-01-11 ENCOUNTER — Encounter (HOSPITAL_COMMUNITY): Payer: Self-pay

## 2013-01-11 DIAGNOSIS — Z5189 Encounter for other specified aftercare: Secondary | ICD-10-CM | POA: Insufficient documentation

## 2013-01-11 DIAGNOSIS — I2510 Atherosclerotic heart disease of native coronary artery without angina pectoris: Secondary | ICD-10-CM | POA: Insufficient documentation

## 2013-01-11 DIAGNOSIS — I252 Old myocardial infarction: Secondary | ICD-10-CM | POA: Insufficient documentation

## 2013-01-11 NOTE — Progress Notes (Signed)
Cardiac Rehab Medication Review by a Pharmacist  Does the patient  feel that his/her medications are working for him/her?  yes  Has the patient been experiencing any side effects to the medications prescribed?  no  Does the patient measure his/her own blood pressure or blood glucose at home?  yes   Does the patient have any problems obtaining medications due to transportation or finances?   no  Understanding of regimen: excellent Understanding of indications: excellent Potential of compliance: excellent    Pharmacist comments: Patient is very organized with a good understanding of his medications regimen. He appears to have excellent adherence without any significant barriers    [name] 01/11/2013 8:48 AM

## 2013-01-15 ENCOUNTER — Encounter (HOSPITAL_COMMUNITY)
Admission: RE | Admit: 2013-01-15 | Discharge: 2013-01-15 | Disposition: A | Discharge status: Home / Self Care | Payer: Medicare HMO | Source: Ambulatory Visit | Attending: Cardiology | Admitting: Cardiology

## 2013-01-15 NOTE — Progress Notes (Signed)
Pt started cardiac rehab today.  Pt tolerated light exercise without difficulty. Pt monitor showed SR.  Pt requested to do the treadmill for all three stations.  Equipment modification made.  Continue to monitor.

## 2013-01-17 ENCOUNTER — Encounter (HOSPITAL_COMMUNITY)
Admission: RE | Admit: 2013-01-17 | Discharge: 2013-01-17 | Disposition: A | Discharge status: Home / Self Care | Payer: Medicare HMO | Source: Ambulatory Visit | Attending: Cardiology | Admitting: Cardiology

## 2013-01-19 ENCOUNTER — Encounter (HOSPITAL_COMMUNITY)
Admission: RE | Admit: 2013-01-19 | Discharge: 2013-01-19 | Disposition: A | Discharge status: Home / Self Care | Payer: Medicare HMO | Source: Ambulatory Visit | Attending: Cardiology | Admitting: Cardiology

## 2013-01-22 ENCOUNTER — Encounter (HOSPITAL_COMMUNITY)
Admission: RE | Admit: 2013-01-22 | Discharge: 2013-01-22 | Disposition: A | Discharge status: Home / Self Care | Payer: Medicare HMO | Source: Ambulatory Visit | Attending: Cardiology | Admitting: Cardiology

## 2013-01-24 ENCOUNTER — Encounter (HOSPITAL_COMMUNITY)
Admission: RE | Admit: 2013-01-24 | Discharge: 2013-01-24 | Disposition: A | Discharge status: Home / Self Care | Payer: Medicare HMO | Source: Ambulatory Visit | Attending: Cardiology | Admitting: Cardiology

## 2013-01-26 ENCOUNTER — Encounter (HOSPITAL_COMMUNITY)
Admission: RE | Admit: 2013-01-26 | Discharge: 2013-01-26 | Disposition: A | Discharge status: Home / Self Care | Payer: Medicare HMO | Source: Ambulatory Visit | Attending: Cardiology | Admitting: Cardiology

## 2013-01-29 ENCOUNTER — Encounter (HOSPITAL_COMMUNITY): Payer: Medicare HMO

## 2013-01-31 ENCOUNTER — Encounter (HOSPITAL_COMMUNITY)
Admission: RE | Admit: 2013-01-31 | Discharge: 2013-01-31 | Disposition: A | Discharge status: Home / Self Care | Payer: Medicare HMO | Source: Ambulatory Visit | Attending: Cardiology | Admitting: Cardiology

## 2013-02-02 ENCOUNTER — Encounter (HOSPITAL_COMMUNITY)
Admission: RE | Admit: 2013-02-02 | Discharge: 2013-02-02 | Disposition: A | Discharge status: Home / Self Care | Payer: Medicare HMO | Source: Ambulatory Visit | Attending: Cardiology | Admitting: Cardiology

## 2013-02-05 ENCOUNTER — Encounter (HOSPITAL_COMMUNITY)
Admission: RE | Admit: 2013-02-05 | Discharge: 2013-02-05 | Disposition: A | Discharge status: Home / Self Care | Payer: Medicare HMO | Source: Ambulatory Visit | Attending: Cardiology | Admitting: Cardiology

## 2013-02-05 DIAGNOSIS — I2510 Atherosclerotic heart disease of native coronary artery without angina pectoris: Secondary | ICD-10-CM | POA: Insufficient documentation

## 2013-02-05 DIAGNOSIS — Z5189 Encounter for other specified aftercare: Secondary | ICD-10-CM | POA: Insufficient documentation

## 2013-02-05 DIAGNOSIS — I252 Old myocardial infarction: Secondary | ICD-10-CM | POA: Insufficient documentation

## 2013-02-06 ENCOUNTER — Encounter: Payer: Self-pay | Admitting: Internal Medicine

## 2013-02-06 ENCOUNTER — Telehealth: Payer: Self-pay | Admitting: Internal Medicine

## 2013-02-06 NOTE — Telephone Encounter (Addendum)
02-06-13 sent certified past due letter/mt 02-20-13 certified letter signed and returned/mt

## 2013-02-07 ENCOUNTER — Encounter (HOSPITAL_COMMUNITY)
Admission: RE | Admit: 2013-02-07 | Discharge: 2013-02-07 | Disposition: A | Discharge status: Home / Self Care | Payer: Medicare HMO | Source: Ambulatory Visit | Attending: Cardiology | Admitting: Cardiology

## 2013-02-07 NOTE — Progress Notes (Signed)
Note created in error, should have been outpatient note.  Created 02/07/13

## 2013-02-07 NOTE — Progress Notes (Signed)
Reviewed home exercise with pt today.  Pt plans to continue to walk on treadmll at gym for exercise.  He is doing 60 min a day.  I have encouraged him to do 10 min pyramids to break up the steady state and to challenge him more.  Reviewed THR, pulse, RPE, sign and symptoms, and when to call 911 or MD.  Pt voiced understanding. Fabio Pierce, MA, ACSM RCEP

## 2013-02-09 ENCOUNTER — Encounter (HOSPITAL_COMMUNITY)
Admission: RE | Admit: 2013-02-09 | Discharge: 2013-02-09 | Disposition: A | Discharge status: Home / Self Care | Payer: Medicare HMO | Source: Ambulatory Visit | Attending: Cardiology | Admitting: Cardiology

## 2013-02-12 ENCOUNTER — Encounter (HOSPITAL_COMMUNITY)
Admission: RE | Admit: 2013-02-12 | Discharge: 2013-02-12 | Disposition: A | Discharge status: Home / Self Care | Payer: Medicare HMO | Source: Ambulatory Visit | Attending: Cardiology | Admitting: Cardiology

## 2013-02-14 ENCOUNTER — Encounter (HOSPITAL_COMMUNITY)
Admission: RE | Admit: 2013-02-14 | Discharge: 2013-02-14 | Disposition: A | Discharge status: Home / Self Care | Payer: Medicare HMO | Source: Ambulatory Visit | Attending: Cardiology | Admitting: Cardiology

## 2013-02-16 ENCOUNTER — Encounter (HOSPITAL_COMMUNITY): Payer: Medicare HMO

## 2013-02-19 ENCOUNTER — Encounter (HOSPITAL_COMMUNITY)
Admission: RE | Admit: 2013-02-19 | Discharge: 2013-02-19 | Disposition: A | Discharge status: Home / Self Care | Payer: Medicare HMO | Source: Ambulatory Visit | Attending: Cardiology | Admitting: Cardiology

## 2013-02-21 ENCOUNTER — Encounter (HOSPITAL_COMMUNITY)
Admission: RE | Admit: 2013-02-21 | Discharge: 2013-02-21 | Disposition: A | Discharge status: Home / Self Care | Payer: Medicare HMO | Source: Ambulatory Visit | Attending: Cardiology | Admitting: Cardiology

## 2013-02-23 ENCOUNTER — Encounter (HOSPITAL_COMMUNITY)
Admission: RE | Admit: 2013-02-23 | Discharge: 2013-02-23 | Disposition: A | Discharge status: Home / Self Care | Payer: Medicare HMO | Source: Ambulatory Visit | Attending: Cardiology | Admitting: Cardiology

## 2013-02-26 ENCOUNTER — Encounter (HOSPITAL_COMMUNITY)
Admission: RE | Admit: 2013-02-26 | Discharge: 2013-02-26 | Disposition: A | Discharge status: Home / Self Care | Payer: Medicare HMO | Source: Ambulatory Visit | Attending: Cardiology | Admitting: Cardiology

## 2013-02-28 ENCOUNTER — Encounter (HOSPITAL_COMMUNITY)
Admission: RE | Admit: 2013-02-28 | Discharge: 2013-02-28 | Disposition: A | Discharge status: Home / Self Care | Payer: Medicare HMO | Source: Ambulatory Visit | Attending: Cardiology | Admitting: Cardiology

## 2013-03-02 ENCOUNTER — Encounter (HOSPITAL_COMMUNITY)
Admission: RE | Admit: 2013-03-02 | Discharge: 2013-03-02 | Disposition: A | Discharge status: Home / Self Care | Payer: Medicare HMO | Source: Ambulatory Visit | Attending: Cardiology | Admitting: Cardiology

## 2013-03-05 ENCOUNTER — Encounter (HOSPITAL_COMMUNITY): Payer: Medicare HMO

## 2013-03-07 ENCOUNTER — Encounter (HOSPITAL_COMMUNITY)
Admission: RE | Admit: 2013-03-07 | Discharge: 2013-03-07 | Disposition: A | Discharge status: Home / Self Care | Payer: Medicare HMO | Source: Ambulatory Visit | Attending: Cardiology | Admitting: Cardiology

## 2013-03-07 DIAGNOSIS — I252 Old myocardial infarction: Secondary | ICD-10-CM | POA: Insufficient documentation

## 2013-03-07 DIAGNOSIS — Z5189 Encounter for other specified aftercare: Secondary | ICD-10-CM | POA: Insufficient documentation

## 2013-03-07 DIAGNOSIS — I2510 Atherosclerotic heart disease of native coronary artery without angina pectoris: Secondary | ICD-10-CM | POA: Insufficient documentation

## 2013-03-09 ENCOUNTER — Encounter (HOSPITAL_COMMUNITY): Payer: Medicare HMO

## 2013-03-12 ENCOUNTER — Encounter (HOSPITAL_COMMUNITY)
Admission: RE | Admit: 2013-03-12 | Discharge: 2013-03-12 | Disposition: A | Discharge status: Home / Self Care | Payer: Medicare HMO | Source: Ambulatory Visit | Attending: Cardiology | Admitting: Cardiology

## 2013-03-14 ENCOUNTER — Encounter (HOSPITAL_COMMUNITY)
Admission: RE | Admit: 2013-03-14 | Discharge: 2013-03-14 | Disposition: A | Discharge status: Home / Self Care | Payer: Medicare HMO | Source: Ambulatory Visit | Attending: Cardiology | Admitting: Cardiology

## 2013-03-16 ENCOUNTER — Encounter (HOSPITAL_COMMUNITY)
Admission: RE | Admit: 2013-03-16 | Discharge: 2013-03-16 | Disposition: A | Discharge status: Home / Self Care | Payer: Medicare HMO | Source: Ambulatory Visit | Attending: Cardiology | Admitting: Cardiology

## 2013-03-19 ENCOUNTER — Encounter (HOSPITAL_COMMUNITY)
Admission: RE | Admit: 2013-03-19 | Discharge: 2013-03-19 | Disposition: A | Discharge status: Home / Self Care | Payer: Medicare HMO | Source: Ambulatory Visit | Attending: Cardiology | Admitting: Cardiology

## 2013-03-21 ENCOUNTER — Encounter (HOSPITAL_COMMUNITY)
Admission: RE | Admit: 2013-03-21 | Discharge: 2013-03-21 | Disposition: A | Discharge status: Home / Self Care | Payer: Medicare HMO | Source: Ambulatory Visit | Attending: Cardiology | Admitting: Cardiology

## 2013-03-23 ENCOUNTER — Encounter (HOSPITAL_COMMUNITY)
Admission: RE | Admit: 2013-03-23 | Discharge: 2013-03-23 | Disposition: A | Discharge status: Home / Self Care | Payer: Medicare HMO | Source: Ambulatory Visit | Attending: Cardiology | Admitting: Cardiology

## 2013-03-26 ENCOUNTER — Encounter (HOSPITAL_COMMUNITY)
Admission: RE | Admit: 2013-03-26 | Discharge: 2013-03-26 | Disposition: A | Discharge status: Home / Self Care | Payer: Medicare HMO | Source: Ambulatory Visit | Attending: Cardiology | Admitting: Cardiology

## 2013-03-28 ENCOUNTER — Encounter (HOSPITAL_COMMUNITY)
Admission: RE | Admit: 2013-03-28 | Discharge: 2013-03-28 | Disposition: A | Discharge status: Home / Self Care | Payer: Medicare HMO | Source: Ambulatory Visit | Attending: Cardiology | Admitting: Cardiology

## 2013-03-30 ENCOUNTER — Encounter (HOSPITAL_COMMUNITY): Payer: Medicare HMO

## 2013-04-02 ENCOUNTER — Encounter (HOSPITAL_COMMUNITY)
Admission: RE | Admit: 2013-04-02 | Discharge: 2013-04-02 | Disposition: A | Discharge status: Home / Self Care | Payer: Medicare HMO | Source: Ambulatory Visit | Attending: Cardiology | Admitting: Cardiology

## 2013-04-04 ENCOUNTER — Encounter (HOSPITAL_COMMUNITY)
Admission: RE | Admit: 2013-04-04 | Discharge: 2013-04-04 | Disposition: A | Discharge status: Home / Self Care | Payer: Medicare HMO | Source: Ambulatory Visit | Attending: Cardiology | Admitting: Cardiology

## 2013-04-06 ENCOUNTER — Encounter (HOSPITAL_COMMUNITY)
Admission: RE | Admit: 2013-04-06 | Discharge: 2013-04-06 | Disposition: A | Discharge status: Home / Self Care | Payer: Medicare HMO | Source: Ambulatory Visit | Attending: Cardiology | Admitting: Cardiology

## 2013-04-06 DIAGNOSIS — I252 Old myocardial infarction: Secondary | ICD-10-CM | POA: Insufficient documentation

## 2013-04-06 DIAGNOSIS — Z5189 Encounter for other specified aftercare: Secondary | ICD-10-CM | POA: Insufficient documentation

## 2013-04-06 DIAGNOSIS — I2510 Atherosclerotic heart disease of native coronary artery without angina pectoris: Secondary | ICD-10-CM | POA: Insufficient documentation

## 2013-04-09 ENCOUNTER — Encounter (HOSPITAL_COMMUNITY): Payer: Medicare HMO

## 2013-04-11 ENCOUNTER — Encounter (HOSPITAL_COMMUNITY)
Admission: RE | Admit: 2013-04-11 | Discharge: 2013-04-11 | Disposition: A | Discharge status: Home / Self Care | Payer: Medicare HMO | Source: Ambulatory Visit | Attending: Cardiology | Admitting: Cardiology

## 2013-04-13 ENCOUNTER — Encounter (HOSPITAL_COMMUNITY)
Admission: RE | Admit: 2013-04-13 | Discharge: 2013-04-13 | Disposition: A | Discharge status: Home / Self Care | Payer: Medicare HMO | Source: Ambulatory Visit | Attending: Cardiology | Admitting: Cardiology

## 2013-04-16 ENCOUNTER — Encounter (HOSPITAL_COMMUNITY)
Admission: RE | Admit: 2013-04-16 | Discharge: 2013-04-16 | Disposition: A | Discharge status: Home / Self Care | Payer: Medicare HMO | Source: Ambulatory Visit | Attending: Cardiology | Admitting: Cardiology

## 2013-04-18 ENCOUNTER — Encounter (HOSPITAL_COMMUNITY)
Admission: RE | Admit: 2013-04-18 | Discharge: 2013-04-18 | Disposition: A | Discharge status: Home / Self Care | Payer: Medicare HMO | Source: Ambulatory Visit | Attending: Cardiology | Admitting: Cardiology

## 2013-04-20 ENCOUNTER — Encounter (HOSPITAL_COMMUNITY)
Admission: RE | Admit: 2013-04-20 | Discharge: 2013-04-20 | Disposition: A | Discharge status: Home / Self Care | Payer: Medicare HMO | Source: Ambulatory Visit | Attending: Cardiology | Admitting: Cardiology

## 2013-04-20 NOTE — Progress Notes (Signed)
Pt completed 36 exercise session in the cardiac rehab phase II program.  Pt plans to continue home exercise at the [facility] gym where he presently exercises on non-rehab days.  Medication list reconciled.  Repeat PHQ2 score 0.

## 2013-07-27 ENCOUNTER — Telehealth: Payer: Self-pay | Admitting: Internal Medicine

## 2013-07-27 DIAGNOSIS — Z941 Heart transplant status: Secondary | ICD-10-CM

## 2013-07-27 NOTE — Telephone Encounter (Signed)
Pt needs referral to [facility] for left and right heart craterization scheduled for 10:30 am today. Pt has [employer] hmo and they require a referral to be seen at [facility]. Fax no [phone]

## 2013-07-27 NOTE — Telephone Encounter (Signed)
Referral order placed.

## 2013-08-07 ENCOUNTER — Encounter: Payer: Self-pay | Admitting: Gastroenterology

## 2013-08-07 ENCOUNTER — Ambulatory Visit (INDEPENDENT_AMBULATORY_CARE_PROVIDER_SITE_OTHER): Payer: Medicare HMO

## 2013-08-07 DIAGNOSIS — Z23 Encounter for immunization: Secondary | ICD-10-CM

## 2013-09-10 ENCOUNTER — Encounter: Payer: Self-pay | Admitting: Internal Medicine

## 2013-09-13 ENCOUNTER — Telehealth: Payer: Self-pay | Admitting: *Deleted

## 2013-09-17 ENCOUNTER — Ambulatory Visit (INDEPENDENT_AMBULATORY_CARE_PROVIDER_SITE_OTHER): Payer: Medicare HMO | Admitting: Gastroenterology

## 2013-09-17 ENCOUNTER — Encounter: Payer: Self-pay | Admitting: Gastroenterology

## 2013-09-17 ENCOUNTER — Telehealth: Payer: Self-pay | Admitting: Internal Medicine

## 2013-09-17 VITALS — BP 122/60 | HR 96 | Ht 67.5 in | Wt 249.0 lb

## 2013-09-17 DIAGNOSIS — I259 Chronic ischemic heart disease, unspecified: Secondary | ICD-10-CM

## 2013-09-17 DIAGNOSIS — Z86010 Personal history of colonic polyps: Secondary | ICD-10-CM | POA: Insufficient documentation

## 2013-09-17 DIAGNOSIS — Z1211 Encounter for screening for malignant neoplasm of colon: Secondary | ICD-10-CM

## 2013-09-17 DIAGNOSIS — Z08 Encounter for follow-up examination after completed treatment for malignant neoplasm: Secondary | ICD-10-CM

## 2013-09-17 DIAGNOSIS — Z85038 Personal history of other malignant neoplasm of large intestine: Principal | ICD-10-CM

## 2013-09-17 MED ORDER — NA SULFATE-K SULFATE-MG SULF 17.5-3.13-1.6 GM/177ML PO SOLN: 1.0000 | Freq: Once | ORAL | Status: DC

## 2013-09-17 NOTE — Progress Notes (Signed)
_                                                                                                                History of Present Illness: White male with history of diabetes, coronary artery disease, status post heart transplant, referred for colonoscopy.  He has no GI complaints including change of bowel habits, abdominal pain, melena or hematochezia.  Colonoscopy in 2009 demonstrated a hyperplastic polyp.  Because he is on immunosuppressants his heart doctors requested that he undergo colonoscopy.    Past Medical History  Diagnosis Date  . DIABETES MELLITUS, TYPE II 12/13/2008  . HYPERLIPIDEMIA 04/25/2007  . OBESITY 02/26/2009  . ANEMIA, OTHER UNSPEC 04/03/2008  . MYOCARDIAL INFARCTION, 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  . CORONARY ARTERY DISEASE 04/25/2007  . 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  . VENTRICULAR TACHYCARDIA 02/26/2009  . Acute on chronic systolic heart failure 5/36/6440  . Abdominal pain, epigastric 03/31/2009  . THYROID DISEASE, HX OF 05/14/2008  . HYPERTENSION, HX OF 05/14/2008  . AUTOMATIC IMPLANTABLE CARDIAC DEFIBRILLATOR SITU 02/26/2009   Past Surgical History  Procedure Laterality Date  . Coronary artery bypass graft    . Cardiac catheterization    . Left ventricular [facility]  [facility]/2009    [facility]  . Angioplasty      with stent  . Cardiac assist device removal    . Heart transplant  07/18/2012   family history includes Heart disease in his mother; Lung cancer in his maternal grandfather and maternal grandmother. Current Outpatient Prescriptions  Medication Sig Dispense Refill  . amLODipine (NORVASC) 5 MG tablet Take 5 mg by mouth daily.      [name] aspirin EC 81 MG tablet Take 81 mg by mouth daily.      [name] atorvastatin (LIPITOR) 80 MG tablet Take 80 mg by mouth daily.       . cholecalciferol (VITAMIN D) 1000 UNITS tablet Take 3,000 Units by mouth daily.      [name] diltiazem (TIAZAC) 180 MG 24 hr capsule Take 360 mg by mouth daily.      . furosemide (LASIX) 20 MG tablet Take  20 mg by mouth every Monday, Wednesday, and Friday.       . gabapentin (NEURONTIN) 300 MG capsule Take 300 mg by mouth 3 (three) times daily.      [name] levothyroxine (SYNTHROID, LEVOTHROID) 75 MCG tablet 75 mcg daily before breakfast.       . lisinopril (PRINIVIL,ZESTRIL) 20 MG tablet Take 40 mg by mouth daily.       . magnesium oxide (MAG-OX) 400 MG tablet Take 500 mg by mouth 2 (two) times daily.      . mycophenolate (CELLCEPT) 500 MG tablet Take 500 mg by mouth 2 (two) times daily.      [name] omeprazole (PRILOSEC) 20 MG capsule Take 20 mg by mouth daily.       . predniSONE (DELTASONE) 5 MG tablet Take 2.5 mg by mouth daily.      . tacrolimus (PROGRAF) 0.5 MG capsule Take  0.5 mg by mouth every morning.      . tacrolimus (PROGRAF) 1 MG capsule Take 1 mg by mouth 2 (two) times daily.      . tamsulosin (FLOMAX) 0.4 MG CAPS Take 0.4 mg by mouth daily.       No current facility-administered medications for this visit.   Allergies as of 09/17/2013  . (No Known Allergies)    reports that he quit smoking about 14 years ago. His smoking use included Cigarettes. He has a 20 pack-year smoking history. He has never used smokeless tobacco. He reports that he drinks alcohol. He reports that he does not use illicit drugs.     Review of Systems: Pertinent positive and negative review of systems were noted in the above HPI section. All other review of systems were otherwise negative.  Vital signs were reviewed in today's medical record Physical Exam: General: Well developed , well nourished, no acute distress Skin: anicteric Head: Normocephalic and atraumatic Eyes:  sclerae anicteric, EOMI Ears: Normal auditory acuity Mouth: No deformity or lesions Neck: Supple, no masses or thyromegaly Lungs: Clear throughout to auscultation Heart: Regular rate and rhythm; no murmurs, rubs or bruits Abdomen: Soft, non tender and non distended. No masses, hepatosplenomegaly  noted. Normal Bowel sounds.  There is a 2 x 2  centimeter umbilical hernia Rectal:deferred Musculoskeletal: Symmetrical with no gross deformities  Skin: No lesions on visible extremities Pulses:  Normal pulses noted Extremities: No clubbing, cyanosis, edema or deformities noted Neurological: Alert oriented x 4, grossly nonfocal Cervical Nodes:  No significant cervical adenopathy Inguinal Nodes: No significant inguinal adenopathy Psychological:  Alert and cooperative. Normal mood and affect  See Assessment and Plan under Problem List

## 2013-09-17 NOTE — Patient Instructions (Signed)

## 2013-09-17 NOTE — Telephone Encounter (Signed)
Prior auth done

## 2013-09-17 NOTE — Telephone Encounter (Signed)
Referral order placed.

## 2013-09-17 NOTE — Assessment & Plan Note (Signed)
Per request plan plan colonoscopy

## 2013-09-17 NOTE — Telephone Encounter (Signed)
Pt is seeing GI today to discuss colonoscopy, because of [employer], he needs a referral. Please assist.

## 2013-09-17 NOTE — Addendum Note (Signed)
Addended by: Oda Kilts on: 09/17/2013 09:33 AM   Modules accepted: Orders

## 2013-09-17 NOTE — Assessment & Plan Note (Signed)
Status post heart transplant November, 2013.  Stable cardiac function.

## 2013-10-03 ENCOUNTER — Ambulatory Visit (AMBULATORY_SURGERY_CENTER): Payer: Medicare HMO | Admitting: Gastroenterology

## 2013-10-03 ENCOUNTER — Encounter: Payer: Self-pay | Admitting: Gastroenterology

## 2013-10-03 VITALS — BP 121/75 | HR 76 | Temp 97.2°F | Resp 22 | Ht 67.5 in | Wt 249.0 lb

## 2013-10-03 DIAGNOSIS — Z1211 Encounter for screening for malignant neoplasm of colon: Secondary | ICD-10-CM

## 2013-10-03 DIAGNOSIS — K5730 Diverticulosis of large intestine without perforation or abscess without bleeding: Secondary | ICD-10-CM

## 2013-10-03 DIAGNOSIS — D126 Benign neoplasm of colon, unspecified: Secondary | ICD-10-CM

## 2013-10-03 LAB — GLUCOSE, CAPILLARY
Glucose-Capillary: 98 mg/dL (ref 70–99)
Glucose-Capillary: 103 mg/dL — ABNORMAL HIGH (ref 70–99)

## 2013-10-03 MED ORDER — SODIUM CHLORIDE 0.9 % IV SOLN: 500.0000 mL | INTRAVENOUS | Status: DC

## 2013-10-03 NOTE — Patient Instructions (Signed)
Discharge instructions with verbal understanding. Handout on polyps and diverticulosis. Resume previous medications. YOU HAD AN ENDOSCOPIC PROCEDURE TODAY AT [facility]: Refer to the procedure report that was given to you for any specific questions about what was found during the examination.  If the procedure report does not answer your questions, please call your gastroenterologist to clarify.  If you requested that your care partner not be given the details of your procedure findings, then the procedure report has been included in a sealed envelope for you to review at your convenience later.  YOU SHOULD EXPECT: Some feelings of bloating in the abdomen. Passage of more gas than usual.  Walking can help get rid of the air that was put into your GI tract during the procedure and reduce the bloating. If you had a lower endoscopy (such as a colonoscopy or flexible sigmoidoscopy) you may notice spotting of blood in your stool or on the toilet paper. If you underwent a bowel prep for your procedure, then you may not have a normal bowel movement for a few days.  DIET: Your first meal following the procedure should be a light meal and then it is ok to progress to your normal diet.  A half-sandwich or bowl of soup is an example of a good first meal.  Heavy or fried foods are harder to digest and may make you feel nauseous or bloated.  Likewise meals heavy in dairy and vegetables can cause extra gas to form and this can also increase the bloating.  Drink plenty of fluids but you should avoid alcoholic beverages for 24 hours.  ACTIVITY: Your care partner should take you home directly after the procedure.  You should plan to take it easy, moving slowly for the rest of the day.  You can resume normal activity the day after the procedure however you should NOT DRIVE or use heavy machinery for 24 hours (because of the sedation medicines used during the test).    SYMPTOMS TO REPORT IMMEDIATELY: A  gastroenterologist can be reached at any hour.  During normal business hours, 8:30 AM to 5:00 PM Monday through Friday, call [phone].  After hours and on weekends, please call the GI answering service at [phone] who will take a message and have the physician on call contact you.   Following lower endoscopy (colonoscopy or flexible sigmoidoscopy):  Excessive amounts of blood in the stool  Significant tenderness or worsening of abdominal pains  Swelling of the abdomen that is new, acute  Fever of 100F or higher  FOLLOW UP: If any biopsies were taken you will be contacted by phone or by letter within the next 1-3 weeks.  Call your gastroenterologist if you have not heard about the biopsies in 3 weeks.  Our staff will call the home number listed on your records the next business day following your procedure to check on you and address any questions or concerns that you may have at that time regarding the information given to you following your procedure. This is a courtesy call and so if there is no answer at the home number and we have not heard from you through the emergency physician on call, we will assume that you have returned to your regular daily activities without incident.  SIGNATURES/CONFIDENTIALITY: You and/or your care partner have signed paperwork which will be entered into your electronic medical record.  These signatures attest to the fact that that the information above on your After Visit Summary has  been reviewed and is understood.  Full responsibility of the confidentiality of this discharge information lies with you and/or your care-partner.

## 2013-10-03 NOTE — Op Note (Addendum)
[place]  [address] [place], [id number]   COLONOSCOPY PROCEDURE REPORT  PATIENT: [name], [name]  MR#: [id number] BIRTHDATE: Sep 04, 1956 , 54  yrs. old GENDER: Male ENDOSCOPIST: Inda Castle, MD REFERRED BY: PROCEDURE DATE:  10/03/2013 PROCEDURE:   Colonoscopy with cold biopsy polypectomy First Screening Colonoscopy - Avg.  risk and is 50 yrs.  old or older - No.  Prior Negative Screening - Now for repeat screening. Other: See Comments  History of Adenoma - Now for follow-up colonoscopy & has been > or = to 3 yrs.  N/A  Polyps Removed Today? Yes. ASA CLASS:   Class II INDICATIONS:patient is status post heart transplantation and is on immunosuppression; colonoscopy requested because of increased risk of malignancy. MEDICATIONS: MAC sedation, administered by CRNA and propofol (Diprivan) 200mg  IV  DESCRIPTION OF PROCEDURE:   After the risks benefits and alternatives of the procedure were thoroughly explained, informed consent was obtained.  A digital rectal exam revealed no abnormalities of the rectum.   The [id number]  endoscope was introduced through the anus and advanced to the cecum, which was identified by both the appendix and ileocecal valve. No adverse events experienced.   Limited by poor preparation. There was a moderate amount of retained, liquid stool.  The quality of the prep was Suprep fair  The instrument was then slowly withdrawn as the colon was fully examined.      COLON FINDINGS: A sessile polyp measuring 2 mm in size was found at the cecum.  A polypectomy was performed with cold forceps.   Mild diverticulosis was noted in the sigmoid colon.   The colon was otherwise normal.  There was no diverticulosis, inflammation, polyps or cancers unless previously stated.  Retroflexed views revealed no abnormalities. The time to cecum=3 minutes 04 seconds. Withdrawal time=12 minutes 15 seconds.  The scope was withdrawn and the  procedure completed. COMPLICATIONS: There were no complications.  ENDOSCOPIC IMPRESSION: 1.   Sessile polyp measuring 2 mm in size was found at the cecum; polypectomy was performed with cold forceps 2.   Mild diverticulosis was noted in the sigmoid colon 3.   The colon was otherwise normal  RECOMMENDATIONS: Because of limitations secondary to retained stool, if adenomatous changes are seen on the cecal polyp would repeat procedure in 3 years  eSigned:  Inda Castle, MD 10/15/2013 2:35 PM Revised: 10/15/2013 2:35 PM  cc: Lisabeth Pick, MD   PATIENT NAME:  [name] MR#: [id number]

## 2013-10-03 NOTE — Progress Notes (Signed)
Called to room to assist during endoscopic procedure.  Patient ID and intended procedure confirmed with present staff. Received instructions for my participation in the procedure from the performing physician.  

## 2013-10-03 NOTE — Progress Notes (Signed)
Lidocaine-40mg IV prior to Propofol InductionPropofol given over incremental dosages 

## 2013-10-04 ENCOUNTER — Telehealth: Payer: Self-pay | Admitting: *Deleted

## 2013-10-04 NOTE — Telephone Encounter (Signed)
Left message that we called for f/u 

## 2013-10-10 ENCOUNTER — Encounter: Payer: Self-pay | Admitting: Gastroenterology

## 2014-02-25 ENCOUNTER — Ambulatory Visit: Payer: Medicare HMO | Admitting: Family

## 2014-02-26 ENCOUNTER — Encounter: Payer: Self-pay | Admitting: Family Medicine

## 2014-02-26 ENCOUNTER — Ambulatory Visit (INDEPENDENT_AMBULATORY_CARE_PROVIDER_SITE_OTHER): Payer: Medicare HMO | Admitting: Family Medicine

## 2014-02-26 VITALS — BP 120/70 | HR 93 | Wt 241.0 lb

## 2014-02-26 DIAGNOSIS — N3000 Acute cystitis without hematuria: Secondary | ICD-10-CM

## 2014-02-26 DIAGNOSIS — R300 Dysuria: Secondary | ICD-10-CM

## 2014-02-26 DIAGNOSIS — N3001 Acute cystitis with hematuria: Secondary | ICD-10-CM

## 2014-02-26 DIAGNOSIS — N400 Enlarged prostate without lower urinary tract symptoms: Secondary | ICD-10-CM

## 2014-02-26 LAB — POCT URINALYSIS DIPSTICK
BILIRUBIN UA: NEGATIVE
GLUCOSE UA: NEGATIVE
Ketones, UA: NEGATIVE
NITRITE UA: POSITIVE
Protein, UA: 5.5
Spec Grav, UA: >=1.03
Urobilinogen, UA: 0.2
pH, UA: 5.5

## 2014-02-26 MED ORDER — CIPROFLOXACIN HCL 500 MG PO TABS: 500.0000 mg | ORAL_TABLET | Freq: Two times a day (BID) | ORAL | Status: DC

## 2014-02-26 NOTE — Progress Notes (Signed)
   Subjective:    Patient ID: [name], male    DOB: 07/07/1956, 58 y.o.   MRN: [id number]  Dysuria  Pertinent negatives include no chills, flank pain, frequency, hematuria, nausea, urgency or vomiting.   Acute visit. Patient is here with difficulty urinating over the past 4 or 5 days. Onset Thursday morning and this was fairly sudden onset. He had past history of BPH and takes Flomax regularly. Denies any change of medication. No anticholinergic medications. He had some mild burning with urination but not consistently. He did have reported low-grade fever 100.6 last Thursday but none since then. Denies any nausea or vomiting. No flank pain. No gross hematuria. No penile discharge.  He has multiple chronic problems including type 2 diabetes, hyperlipidemia, obesity, history of CAD, history of cardiac transplant, hypothyroidism. Doing well from cardiac standpoint. No dyspnea. No chest pains.  Reviewed with no change:  Past Medical History  Diagnosis Date  . HYPERLIPIDEMIA 04/25/2007  . OBESITY 02/26/2009  . ANEMIA, OTHER UNSPEC 04/03/2008  . MYOCARDIAL INFARCTION, 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  . CORONARY ARTERY DISEASE 04/25/2007  . 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  . VENTRICULAR TACHYCARDIA 02/26/2009  . Acute on chronic systolic heart failure 2/35/5732  . Abdominal pain, epigastric 03/31/2009  . THYROID DISEASE, HX OF 05/14/2008  . HYPERTENSION, HX OF 05/14/2008  . 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 DIABETES MELLITUS, TYPE II 12/13/2008    no meds, diet contolled  . Thyroid disease   . Blood transfusion without reported diagnosis   . CHF (congestive heart failure)   . GERD (gastroesophageal reflux disease)   . Hypertension    Past Surgical History  Procedure Laterality Date  . Coronary artery bypass graft    . Cardiac catheterization    . [facility]  [facility]    [facility]  . Angioplasty      with stent  . Cardiac assist device removal    . Heart  transplant  07/18/2012  . Colonoscopy      reports that he quit smoking about 14 years ago. His smoking use included Cigarettes. He has a 20 pack-year smoking history. He has never used smokeless tobacco. He reports that he drinks alcohol. He reports that he does not use illicit drugs. family history includes Heart disease in his mother; Lung cancer in his maternal grandfather and maternal grandmother. There is no history of Colon cancer, Esophageal cancer, Prostate cancer, Rectal cancer, or Stomach cancer. No Known Allergies    Review of Systems  Constitutional: Negative for fever and chills.  Respiratory: Negative for shortness of breath.   Cardiovascular: Negative for chest pain.  Gastrointestinal: Negative for nausea, vomiting and abdominal pain.  Endocrine: Negative for polydipsia and polyuria.  Genitourinary: Positive for dysuria and decreased urine volume. Negative for urgency, frequency, hematuria and flank pain.       Objective:   Physical Exam  Constitutional: He appears well-developed and well-nourished.  Cardiovascular: Normal rate.   Pulmonary/Chest: Effort normal and breath sounds normal. No respiratory distress. He has no wheezes. He has no rales.  Genitourinary:  Prostate diffusely enlarged but not boggy and nontender. No rectal mass  Musculoskeletal: He exhibits no edema.          Assessment & Plan:  Dysuria. History of BPH. Rule out infection. Clinically, no evidence for acute prostatitis.  Urinalysis shows red blood cells, leukocytes, and nitrites. Urine culture sent. Start Cipro 500 mg twice a day pending culture results

## 2014-02-26 NOTE — Patient Instructions (Signed)
Urinary Tract Infection A urinary tract infection (UTI) can occur any place along the urinary tract. The tract includes the kidneys, ureters, bladder, and urethra. A type of germ called bacteria often causes a UTI. UTIs are often helped with antibiotic medicine.  HOME CARE   If given, take antibiotics as told by your doctor. Finish them even if you start to feel better.  Drink enough fluids to keep your pee (urine) clear or pale yellow.  Avoid tea, drinks with caffeine, and bubbly (carbonated) drinks.  Pee often. Avoid holding your pee in for a long time.  Pee before and after having sex (intercourse).  Wipe from front to back after you poop (bowel movement) if you are a woman. Use each tissue only once. GET HELP RIGHT AWAY IF:   You have back pain.  You have lower belly (abdominal) pain.  You have chills.  You feel sick to your stomach (nauseous).  You throw up (vomit).  Your burning or discomfort with peeing does not go away.  You have a fever.  Your symptoms are not better in 3 days. MAKE SURE YOU:   Understand these instructions.  Will watch your condition.  Will get help right away if you are not doing well or get worse. Document Released: 02/09/2008 Document Revised: 05/17/2012 Document Reviewed: 03/23/2012 Exit[employer] Patient Information 2015 [employer]. This information is not intended to replace advice given to you by your health care Glynn Yepes. Make sure you discuss any questions you have with your health care Lilia Letterman.  

## 2014-02-26 NOTE — Progress Notes (Signed)
Pre visit review using our clinic review tool, if applicable. No additional management support is needed unless otherwise documented below in the visit note. 

## 2014-03-01 LAB — URINE CULTURE

## 2014-04-05 ENCOUNTER — Telehealth: Payer: Self-pay

## 2014-04-05 NOTE — Telephone Encounter (Signed)
Called pt to schedule an appt for diabetic bundle. Pt states he is not a diabetic any longer.  Pt will transfer care to a new pcp.

## 2014-04-16 ENCOUNTER — Encounter: Payer: Self-pay | Admitting: Gastroenterology

## 2014-06-19 ENCOUNTER — Ambulatory Visit (INDEPENDENT_AMBULATORY_CARE_PROVIDER_SITE_OTHER): Payer: Commercial Managed Care - HMO | Admitting: Family Medicine

## 2014-06-19 ENCOUNTER — Encounter: Payer: Self-pay | Admitting: Family Medicine

## 2014-06-19 VITALS — BP 142/70 | HR 80 | Temp 97.6°F | Wt 243.0 lb

## 2014-06-19 DIAGNOSIS — I10 Essential (primary) hypertension: Secondary | ICD-10-CM

## 2014-06-19 DIAGNOSIS — Z23 Encounter for immunization: Secondary | ICD-10-CM

## 2014-06-19 DIAGNOSIS — E119 Type 2 diabetes mellitus without complications: Secondary | ICD-10-CM

## 2014-06-19 DIAGNOSIS — E785 Hyperlipidemia, unspecified: Secondary | ICD-10-CM

## 2014-06-19 DIAGNOSIS — Z941 Heart transplant status: Secondary | ICD-10-CM | POA: Insufficient documentation

## 2014-06-19 DIAGNOSIS — Z79899 Other long term (current) drug therapy: Secondary | ICD-10-CM

## 2014-06-19 DIAGNOSIS — E038 Other specified hypothyroidism: Secondary | ICD-10-CM

## 2014-06-19 DIAGNOSIS — M1990 Unspecified osteoarthritis, unspecified site: Secondary | ICD-10-CM | POA: Insufficient documentation

## 2014-06-19 DIAGNOSIS — K219 Gastro-esophageal reflux disease without esophagitis: Secondary | ICD-10-CM | POA: Insufficient documentation

## 2014-06-19 DIAGNOSIS — E034 Atrophy of thyroid (acquired): Secondary | ICD-10-CM

## 2014-06-19 LAB — HEMOGLOBIN A1C
Hgb A1c MFr Bld: 5.6 % (ref 4.0–6.0)
TSH: 1.58

## 2014-06-19 LAB — LDL CHOLESTEROL, DIRECT: Direct LDL: 69

## 2014-06-19 NOTE — Assessment & Plan Note (Signed)
Well controlled with diet/exercise with a1c 5.6. Updated health maintenance including foot exam.

## 2014-06-19 NOTE — Patient Instructions (Addendum)
Sign a release of information for your colonoscopy to be sent to [facility]. Care everywhere says-leave Ysidro Evert a message [phone]). Also sign one to get [place] a copy of your eye exam.   Flu shot  We referred you to dermatology today, they should call with an appointment.

## 2014-06-19 NOTE — Assessment & Plan Note (Signed)
Continue atorvastatin as LDL well controlled at 69 at visit at [facility] in June.

## 2014-06-19 NOTE — Assessment & Plan Note (Signed)
Mild poor control as missed AM meds. Encouraged patient not to miss doses.

## 2014-06-19 NOTE — Assessment & Plan Note (Signed)
Continue levothyroxine 75 mcg as TSH well controlled at [facility] visit.

## 2014-06-19 NOTE — Progress Notes (Signed)
Kevin Reddish, MD Phone: [phone]  Subjective:  Patient presents today to establish care with me as their new primary care provider. Patient was formerly a patient of Dr. Leanne Chang. Chief complaint-noted.   Hyperlipidemia-well controlled 02/12/2014 LDL 69 through careeverywhere.  On statin: atorvastatin 80mg  Regular exercise: no, advised ROS- no chest pain or shortness of breath. No myalgias  DIABETES Type II-well controlled  Lab Results  Component Value Date   HGBA1C 5.6 02/12/2014   HGBA1C 10.2* 12/05/2008  With diet and exercise was able to get a1c down to 5.6 on 02/12/14 as checked by [facility] through care everywhere.  Walking 4-5 miles a day previously, states "got lazy" and now going sparingly  Health Maintenance Due  Topic Date Due  . Ophthalmology Exam  12/16/1966  . Influenza Vaccine  04/06/2014  ROS- Endorses nocturia 3-4x a night even on flomax. No blurfy vision.  Denies Hypoglycemia symptoms (shaky, sweaty, hungry, weak anxious, tremor, palpitations, confusion, behavior change).   Hypothyroidism-well controlled TSH 1.58 02/12/14 was 1.58.  On thyroid medication-yes, levothyroxine ROS-No hair or nail changes. No heat/cold intolerance. No constipation or diarrhea. Denies shakiness (other than with tacrolimus) or anxiety. No palpitations.   Hypertension-mild poor control as did not take AM BP meds, encouraged patient to do so  BP Readings from Last 3 Encounters:  06/19/14 142/70  02/26/14 120/70  10/03/13 121/75  ROS-Denies any CP, HA, SOB.   The following were reviewed and entered/updated in epic: Past Medical History  Diagnosis Date  . HYPERLIPIDEMIA 04/25/2007  . OBESITY 02/26/2009  . ANEMIA, OTHER UNSPEC 04/03/2008  . MYOCARDIAL INFARCTION, 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  . CORONARY ARTERY DISEASE 04/25/2007  . 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  . VENTRICULAR TACHYCARDIA 02/26/2009  . Acute on chronic systolic heart failure 5/62/1308  . Abdominal pain, epigastric 03/31/2009  . THYROID  DISEASE, HX OF 05/14/2008  . HYPERTENSION, HX OF 05/14/2008  . 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 DIABETES MELLITUS, TYPE II 12/13/2008    no meds, diet contolled  . Thyroid disease   . Blood transfusion without reported diagnosis   . CHF (congestive heart failure)   . GERD (gastroesophageal reflux disease)   . Hypertension    Patient Active Problem List   Diagnosis Date Noted  . 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 Acute on chronic systolic heart failure 65/78/4696    Priority: High  . 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    Priority: High  . 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 Diabetes mellitus type II, controlled 12/13/2008    Priority: High  . CORONARY ARTERY DISEASE 04/25/2007    Priority: High  . BPH (benign prostatic hypertrophy) 02/26/2014    Priority: Medium  . Hypothyroidism 05/14/2008    Priority: Medium  . Essential hypertension 05/14/2008    Priority: Medium  . ANEMIA, OTHER UNSPEC 04/03/2008    Priority: Medium  . Hyperlipidemia 04/25/2007    Priority: Medium  . GERD (gastroesophageal reflux disease) 06/19/2014    Priority: Low  . Osteoarthritis 06/19/2014    Priority: Low  . Special screening for malignant neoplasms, colon 09/17/2013    Priority: Low  . OBESITY 02/26/2009    Priority: Low   Past Surgical History  Procedure Laterality Date  . Coronary artery bypass graft    . Cardiac catheterization    . Left ventricular assist device  05/2009    [facility]  . Angioplasty      with stent  .  Cardiac assist device removal    . Heart transplant  07/18/2012  . Colonoscopy      Family History  Problem Relation Age of Onset  . Heart disease Mother   . Lung cancer Maternal Grandfather   . Lung cancer Maternal Grandmother   . Colon cancer Neg Hx   . Esophageal cancer Neg Hx   . Prostate cancer Neg Hx   . Rectal cancer Neg Hx   . Stomach cancer Neg Hx     Medications-  reviewed and updated Current Outpatient Prescriptions  Medication Sig Dispense Refill  . amLODipine (NORVASC) 5 MG tablet Take 5 mg by mouth daily.      [name] aspirin EC 81 MG tablet Take 81 mg by mouth daily.      [name] atorvastatin (LIPITOR) 80 MG tablet Take 80 mg by mouth daily.       . cholecalciferol (VITAMIN D) 1000 UNITS tablet Take 3,000 Units by mouth daily.      [name] diltiazem (TIAZAC) 180 MG 24 hr capsule Take 360 mg by mouth daily.      . furosemide (LASIX) 20 MG tablet Take 20 mg by mouth.       . levothyroxine (SYNTHROID, LEVOTHROID) 75 MCG tablet 75 mcg daily before breakfast.       . lisinopril (PRINIVIL,ZESTRIL) 20 MG tablet Take 40 mg by mouth daily.       . magnesium oxide (MAG-OX) 400 MG tablet Take 500 mg by mouth 2 (two) times daily.      . mycophenolate (CELLCEPT) 500 MG tablet Take 500 mg by mouth 2 (two) times daily.      [name] omeprazole (PRILOSEC) 20 MG capsule Take 20 mg by mouth daily.       . tacrolimus (PROGRAF) 1 MG capsule Take 1 mg by mouth 2 (two) times daily.      . tamsulosin (FLOMAX) 0.4 MG CAPS Take 0.4 mg by mouth daily.       No current facility-administered medications for this visit.    Allergies-reviewed and updated No Known Allergies  History   Social History  . Marital Status: Single    Spouse Name: N/A    Number of Children: 1  . Years of Education: N/A   Social History Main Topics  . Smoking status: Former Smoker -- 1.00 packs/day for 20 years    Types: Cigarettes    Quit date: 07/02/1999  . Smokeless tobacco: Never Used  . Alcohol Use: Yes     Comment: rarely  . Drug Use: No  . Sexual Activity: None   Other Topics Concern  . None   Social History Narrative   Single. Divorced. 1 son. 1 granddaughter 2014.       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       Hobbies: time on CPU             ROS--See HPI   Objective: BP 142/70  Pulse 80  Temp(Src) 97.6 F (36.4 C)  Wt 243 lb (110.224 kg) Gen: NAD,  resting comfortably on table HEENT: Mucous membranes are moist. Oropharynx normal. Dentures on top, none on bottom Neck: no thyromegaly CV: RRR no murmurs rubs or gallops Lungs: CTAB no crackles, wheeze, rhonchi Abdomen: soft/nontender/nondistended/normal bowel sounds. Umbilical hernia- reducable Ext: no edema, DM foot exam normal Pulse 2+ PT but stronger on right Skin: warm, dry, no rash Neuro: grossly normal, moves all extremities, PERRLA   Assessment/Plan:  Hyperlipidemia Continue atorvastatin as LDL  well controlled at 40 at visit at [facility] in June.   Diabetes mellitus type II, controlled Well controlled with diet/exercise with a1c 5.6. Updated health maintenance including foot exam.   Hypothyroidism Continue levothyroxine 75 mcg as TSH well controlled at [facility] visit.   Essential hypertension Mild poor control as missed AM meds. Encouraged patient not to miss doses.    As patient on immunosuppressives, referred to dermatology for regular exams with increased skin cancer risk per [facility] transplant request.  Orders Placed This Encounter  Procedures  . Hemoglobin A1c    This order was created through External Result Entry  . LDL cholesterol, direct    This order was created through External Result Entry  . Ambulatory referral to Dermatology    Referral Priority:  Routine    Referral Type:  Consultation    Referral Reason:  Specialty Services Required    Requested Specialty:  Dermatology    Number of Visits Requested:  1

## 2014-07-24 LAB — HEMOGLOBIN A1C: Hgb A1c MFr Bld: 5.8 % (ref 4.0–6.0)

## 2014-11-29 DIAGNOSIS — R799 Abnormal finding of blood chemistry, unspecified: Secondary | ICD-10-CM | POA: Diagnosis not present

## 2014-11-29 DIAGNOSIS — Z79899 Other long term (current) drug therapy: Secondary | ICD-10-CM | POA: Diagnosis not present

## 2014-12-16 ENCOUNTER — Ambulatory Visit (INDEPENDENT_AMBULATORY_CARE_PROVIDER_SITE_OTHER): Payer: Commercial Managed Care - HMO | Admitting: Family Medicine

## 2014-12-16 ENCOUNTER — Encounter: Payer: Self-pay | Admitting: Family Medicine

## 2014-12-16 VITALS — BP 140/58 | HR 88 | Temp 97.9°F | Wt 254.0 lb

## 2014-12-16 DIAGNOSIS — I10 Essential (primary) hypertension: Secondary | ICD-10-CM | POA: Diagnosis not present

## 2014-12-16 DIAGNOSIS — E785 Hyperlipidemia, unspecified: Secondary | ICD-10-CM

## 2014-12-16 DIAGNOSIS — E119 Type 2 diabetes mellitus without complications: Secondary | ICD-10-CM | POA: Diagnosis not present

## 2014-12-16 MED ORDER — AMLODIPINE BESYLATE 10 MG PO TABS: 10.0000 mg | ORAL_TABLET | Freq: Every day | ORAL | Status: DC

## 2014-12-16 NOTE — Assessment & Plan Note (Signed)
Controlled with diet/exercise alone. a1c 5.8 in November, he will have repeat at [facility] with labs in next 2-3 months.

## 2014-12-16 NOTE — Assessment & Plan Note (Signed)
controlled on atorvastatin 80mg . Encouraged regular exercise and healthy eating. Weight loss would be beneficial.

## 2014-12-16 NOTE — Progress Notes (Signed)
Garret Reddish, MD Phone: [phone]  Subjective:   [name] is a 59 y.o. year old very pleasant male patient who presents with the following:  Hypertension-poor control  BP Readings from Last 3 Encounters:  12/16/14 140/58  06/19/14 142/70  02/26/14 120/70   Home BP monitoring-mainly in 140s but at times into 150s Compliant with medications-yes without side effects ROS-Denies any CP, HA, SOB, blurry vision, LE edema   Diabetes II-controlled -controlled with attempts to be active and maintain reasonable diet Lab Results  Component Value Date   HGBA1C 5.8 07/24/2014  ROS- no blurry vision or hypoglycemia symtpoms  Hyperlipidemia-controlled on atorvastatin 80mg .  LDL 92 on 07/24/14 through care everywhere ROS- no chest pain or shortness of breath. No myalgias   Past Medical History- Patient Active Problem List   Diagnosis Date Noted  . 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 Acute on chronic systolic heart failure 95/63/8756    Priority: High  . 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    Priority: High  . 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 Diabetes mellitus type II, controlled 12/13/2008    Priority: High  . CORONARY ARTERY DISEASE 04/25/2007    Priority: High  . BPH (benign prostatic hypertrophy) 02/26/2014    Priority: Medium  . Hypothyroidism 05/14/2008    Priority: Medium  . Essential hypertension 05/14/2008    Priority: Medium  . ANEMIA, OTHER UNSPEC 04/03/2008    Priority: Medium  . Hyperlipidemia 04/25/2007    Priority: Medium  . GERD (gastroesophageal reflux disease) 06/19/2014    Priority: Low  . Osteoarthritis 06/19/2014    Priority: Low  . Special screening for malignant neoplasms, colon 09/17/2013    Priority: Low  . OBESITY 02/26/2009    Priority: Low   Medications- reviewed and updated Current Outpatient Prescriptions  Medication Sig Dispense Refill  . amLODipine (NORVASC) 5  MG tablet Take 5 mg by mouth daily.    [name] aspirin EC 81 MG tablet Take 81 mg by mouth daily.    [name] atorvastatin (LIPITOR) 80 MG tablet Take 80 mg by mouth daily.     . cholecalciferol (VITAMIN D) 1000 UNITS tablet Take 3,000 Units by mouth daily.    [name] diltiazem (TIAZAC) 180 MG 24 hr capsule Take 360 mg by mouth daily.    . furosemide (LASIX) 20 MG tablet Take 20 mg by mouth.     . levothyroxine (SYNTHROID, LEVOTHROID) 75 MCG tablet 75 mcg daily before breakfast.     . lisinopril (PRINIVIL,ZESTRIL) 20 MG tablet Take 40 mg by mouth daily.     . magnesium oxide (MAG-OX) 400 MG tablet Take 500 mg by mouth 2 (two) times daily.    . mycophenolate (CELLCEPT) 500 MG tablet Take 500 mg by mouth 2 (two) times daily.    [name] omeprazole (PRILOSEC) 20 MG capsule Take 20 mg by mouth daily.     . tacrolimus (PROGRAF) 1 MG capsule Take 1 mg by mouth 2 (two) times daily.    . tamsulosin (FLOMAX) 0.4 MG CAPS Take 0.4 mg by mouth daily.       Objective: BP 140/58 mmHg  Pulse 88  Temp(Src) 97.9 F (36.6 C)  Wt 254 lb (115.214 kg) Gen: NAD, resting comfortably in chair CV: RRR no murmurs rubs or gallops Lungs: CTAB no crackles, wheeze, rhonchi Abdomen: soft/nontender/nondistended/normal bowel sounds. obese Ext: trace edema Skin: warm, dry, no rash   Assessment/Plan:  Essential hypertension Poor control  on amlodipine 5mg ,  diltiazem, lasix 20mg , lisinopril. Increase amlodipine to 10mg -he will follow up with [facility] and let me know what his BP is when he sees them.    Diabetes mellitus type II, controlled Controlled with diet/exercise alone. a1c 5.8 in November, he will have repeat at [facility] with labs in next 2-3 months.    Hyperlipidemia controlled on atorvastatin 80mg . Encouraged regular exercise and healthy eating. Weight loss would be beneficial.    6 month follow up. Asked for copies of optho. Home monitoring BP also recommended-need to verify cuff either here or [facility]. Needs prevnar-to check with [facility]     Meds ordered this encounter  Medications  . amLODipine (NORVASC) 10 MG tablet    Sig: Take 1 tablet (10 mg total) by mouth daily.    Dispense:  30 tablet    Refill:  11

## 2014-12-16 NOTE — Patient Instructions (Addendum)
Get record of eye exam faxed to [place] at [phone].  BP a hair high and home readings sometimes even higher. I want to keep you below 140/90 so increase amlodipine to 10mg  and continue all other medications. Let me know what your blood pressure is when you got [facility]. You could bring your cuff with you to our visits or your [facility] visit to make sure its accurate.   You received Prevnar 07/18/2012 and pneumovax 09/06/2009 and it appears you are due for your final pneumovax, make sure [facility] has not given you another one, otherwise either they can give you the next one or we can if they are ok with it.   A1c was 5.8 in November. I am not going to recheck today since they will do bloodwork.  Check in with me 6 months from now.   Health Maintenance Due  Topic Date Due  . OPHTHALMOLOGY EXAM -getting records 12/16/1965  . PNEUMOCOCCAL POLYSACCHARIDE VACCINE (2) 09/06/2014

## 2014-12-16 NOTE — Assessment & Plan Note (Signed)
Poor control on amlodipine 5mg ,  diltiazem, lasix 20mg , lisinopril. Increase amlodipine to 10mg -he will follow up with [facility] and let me know what his BP is when he sees them.

## 2015-01-04 DIAGNOSIS — T8621 Heart transplant rejection: Secondary | ICD-10-CM | POA: Diagnosis not present

## 2015-01-04 DIAGNOSIS — R7989 Other specified abnormal findings of blood chemistry: Secondary | ICD-10-CM | POA: Diagnosis not present

## 2015-01-04 DIAGNOSIS — Z48298 Encounter for aftercare following other organ transplant: Secondary | ICD-10-CM | POA: Diagnosis not present

## 2015-01-04 DIAGNOSIS — E559 Vitamin D deficiency, unspecified: Secondary | ICD-10-CM | POA: Diagnosis not present

## 2015-01-04 DIAGNOSIS — Z125 Encounter for screening for malignant neoplasm of prostate: Secondary | ICD-10-CM | POA: Diagnosis not present

## 2015-01-04 DIAGNOSIS — Z941 Heart transplant status: Secondary | ICD-10-CM | POA: Diagnosis not present

## 2015-01-04 DIAGNOSIS — Z79899 Other long term (current) drug therapy: Secondary | ICD-10-CM | POA: Diagnosis not present

## 2015-01-04 DIAGNOSIS — E785 Hyperlipidemia, unspecified: Secondary | ICD-10-CM | POA: Diagnosis not present

## 2015-01-04 LAB — HEMOGLOBIN A1C: Hemoglobin A1C: 6.1

## 2015-01-07 DIAGNOSIS — Z941 Heart transplant status: Secondary | ICD-10-CM | POA: Diagnosis not present

## 2015-01-07 DIAGNOSIS — M1990 Unspecified osteoarthritis, unspecified site: Secondary | ICD-10-CM | POA: Diagnosis not present

## 2015-01-07 DIAGNOSIS — G629 Polyneuropathy, unspecified: Secondary | ICD-10-CM | POA: Diagnosis not present

## 2015-01-07 DIAGNOSIS — E669 Obesity, unspecified: Secondary | ICD-10-CM | POA: Diagnosis not present

## 2015-01-07 DIAGNOSIS — E785 Hyperlipidemia, unspecified: Secondary | ICD-10-CM | POA: Diagnosis not present

## 2015-01-07 DIAGNOSIS — E875 Hyperkalemia: Secondary | ICD-10-CM | POA: Diagnosis not present

## 2015-01-07 DIAGNOSIS — Z4821 Encounter for aftercare following heart transplant: Secondary | ICD-10-CM | POA: Diagnosis not present

## 2015-01-07 DIAGNOSIS — I517 Cardiomegaly: Secondary | ICD-10-CM | POA: Diagnosis not present

## 2015-01-07 DIAGNOSIS — B258 Other cytomegaloviral diseases: Secondary | ICD-10-CM | POA: Diagnosis not present

## 2015-01-07 DIAGNOSIS — R10811 Right upper quadrant abdominal tenderness: Secondary | ICD-10-CM | POA: Diagnosis not present

## 2015-01-07 DIAGNOSIS — E039 Hypothyroidism, unspecified: Secondary | ICD-10-CM | POA: Diagnosis not present

## 2015-01-07 DIAGNOSIS — I10 Essential (primary) hypertension: Secondary | ICD-10-CM | POA: Diagnosis not present

## 2015-01-07 DIAGNOSIS — T8620 Unspecified complication of heart transplant: Secondary | ICD-10-CM | POA: Diagnosis not present

## 2015-01-07 LAB — BASIC METABOLIC PANEL
BUN: 20 mg/dL (ref 4–21)
CREATININE: 0.9 mg/dL (ref <=1.3)
GLUCOSE: 106 mg/dL
POTASSIUM: 4.7 mmol/L (ref 3.4–5.3)
Sodium: 137 mmol/L (ref 137–147)

## 2015-01-20 ENCOUNTER — Encounter: Payer: Self-pay | Admitting: Family Medicine

## 2015-01-22 ENCOUNTER — Encounter: Payer: Self-pay | Admitting: Family Medicine

## 2015-03-03 ENCOUNTER — Other Ambulatory Visit: Payer: Self-pay

## 2015-04-05 DIAGNOSIS — Z941 Heart transplant status: Secondary | ICD-10-CM | POA: Diagnosis not present

## 2015-04-05 DIAGNOSIS — Z48298 Encounter for aftercare following other organ transplant: Secondary | ICD-10-CM | POA: Diagnosis not present

## 2015-04-05 DIAGNOSIS — R7989 Other specified abnormal findings of blood chemistry: Secondary | ICD-10-CM | POA: Diagnosis not present

## 2015-04-05 DIAGNOSIS — Z79899 Other long term (current) drug therapy: Secondary | ICD-10-CM | POA: Diagnosis not present

## 2015-04-17 DIAGNOSIS — Z79899 Other long term (current) drug therapy: Secondary | ICD-10-CM | POA: Diagnosis not present

## 2015-04-17 DIAGNOSIS — Z941 Heart transplant status: Secondary | ICD-10-CM | POA: Diagnosis not present

## 2015-04-17 DIAGNOSIS — Z48298 Encounter for aftercare following other organ transplant: Secondary | ICD-10-CM | POA: Diagnosis not present

## 2015-04-17 DIAGNOSIS — R7989 Other specified abnormal findings of blood chemistry: Secondary | ICD-10-CM | POA: Diagnosis not present

## 2015-05-06 DIAGNOSIS — Z79899 Other long term (current) drug therapy: Secondary | ICD-10-CM | POA: Diagnosis not present

## 2015-05-06 DIAGNOSIS — R7989 Other specified abnormal findings of blood chemistry: Secondary | ICD-10-CM | POA: Diagnosis not present

## 2015-05-06 DIAGNOSIS — Z48298 Encounter for aftercare following other organ transplant: Secondary | ICD-10-CM | POA: Diagnosis not present

## 2015-05-06 DIAGNOSIS — Z941 Heart transplant status: Secondary | ICD-10-CM | POA: Diagnosis not present

## 2015-06-16 ENCOUNTER — Ambulatory Visit: Payer: Commercial Managed Care - HMO | Admitting: Family Medicine

## 2015-08-19 ENCOUNTER — Ambulatory Visit: Payer: Commercial Managed Care - HMO | Admitting: Family Medicine

## 2015-08-25 DIAGNOSIS — B259 Cytomegaloviral disease, unspecified: Secondary | ICD-10-CM | POA: Diagnosis not present

## 2015-08-25 DIAGNOSIS — E119 Type 2 diabetes mellitus without complications: Secondary | ICD-10-CM | POA: Diagnosis not present

## 2015-08-25 DIAGNOSIS — E785 Hyperlipidemia, unspecified: Secondary | ICD-10-CM | POA: Diagnosis not present

## 2015-08-25 DIAGNOSIS — Z48298 Encounter for aftercare following other organ transplant: Secondary | ICD-10-CM | POA: Diagnosis not present

## 2015-08-25 DIAGNOSIS — Z7901 Long term (current) use of anticoagulants: Secondary | ICD-10-CM | POA: Diagnosis not present

## 2015-08-25 DIAGNOSIS — Z125 Encounter for screening for malignant neoplasm of prostate: Secondary | ICD-10-CM | POA: Diagnosis not present

## 2015-08-25 DIAGNOSIS — E559 Vitamin D deficiency, unspecified: Secondary | ICD-10-CM | POA: Diagnosis not present

## 2015-08-25 DIAGNOSIS — R7989 Other specified abnormal findings of blood chemistry: Secondary | ICD-10-CM | POA: Diagnosis not present

## 2015-08-25 LAB — LIPID PANEL: LDL Cholesterol: 94 mg/dL

## 2015-08-29 ENCOUNTER — Encounter: Payer: Self-pay | Admitting: Family Medicine

## 2015-08-29 ENCOUNTER — Ambulatory Visit (INDEPENDENT_AMBULATORY_CARE_PROVIDER_SITE_OTHER): Payer: Commercial Managed Care - HMO | Admitting: Family Medicine

## 2015-08-29 VITALS — BP 130/64 | HR 101 | Temp 98.6°F | Wt 257.0 lb

## 2015-08-29 DIAGNOSIS — Z23 Encounter for immunization: Secondary | ICD-10-CM

## 2015-08-29 DIAGNOSIS — E785 Hyperlipidemia, unspecified: Secondary | ICD-10-CM | POA: Diagnosis not present

## 2015-08-29 DIAGNOSIS — E119 Type 2 diabetes mellitus without complications: Secondary | ICD-10-CM

## 2015-08-29 DIAGNOSIS — I10 Essential (primary) hypertension: Secondary | ICD-10-CM | POA: Diagnosis not present

## 2015-08-29 MED ORDER — GLUCOSE BLOOD VI STRP: ORAL_STRIP | Status: DC

## 2015-08-29 NOTE — Patient Instructions (Addendum)
Flu shot received today.  Have eye exam faxed to [place] at [phone]. You can also Sign release of information at the checkout desk to request this.   If you have recurrent issues with lower abdomen pain- let's get a urine culture  Blood pressure mildly high today but more concerned about home orders. Discuss with transplant docs this week would they would like to do- I am hesitant to go up on amlodipine since they did not like that move last time.

## 2015-08-29 NOTE — Progress Notes (Signed)
Garret Reddish, MD  Subjective:  [name] is a 59 y.o. year old very pleasant male patient who presents for/with See problem oriented charting ROS- denies chest pain, shortness of breath, headache, blurry vision  Past Medical History-  Patient Active Problem List   Diagnosis Date Noted  . Heart transplanted ([place]) 06/19/2014    Priority: High  . Acute on chronic systolic heart failure ([place]) 03/02/2009    Priority: High  . 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    Priority: High  . 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 Diabetes mellitus type II, controlled ([place]) 12/13/2008    Priority: High  . CORONARY ARTERY DISEASE 04/25/2007    Priority: High  . BPH (benign prostatic hypertrophy) 02/26/2014    Priority: Medium  . Hypothyroidism 05/14/2008    Priority: Medium  . Essential hypertension 05/14/2008    Priority: Medium  . ANEMIA, OTHER UNSPEC 04/03/2008    Priority: Medium  . Hyperlipidemia 04/25/2007    Priority: Medium  . GERD (gastroesophageal reflux disease) 06/19/2014    Priority: Low  . Osteoarthritis 06/19/2014    Priority: Low  . Special screening for malignant neoplasms, colon 09/17/2013    Priority: Low  . OBESITY 02/26/2009    Priority: Low    Medications- reviewed and updated Current Outpatient Prescriptions  Medication Sig Dispense Refill  . amLODipine (NORVASC) 10 MG tablet Take 1 tablet (10 mg total) by mouth daily. 30 tablet 11  . aspirin EC 81 MG tablet Take 81 mg by mouth daily.    [name] atorvastatin (LIPITOR) 80 MG tablet Take 80 mg by mouth daily.     . cholecalciferol (VITAMIN D) 1000 UNITS tablet Take 3,000 Units by mouth daily.    [name] diltiazem (TIAZAC) 180 MG 24 hr capsule Take 360 mg by mouth daily.    . furosemide (LASIX) 20 MG tablet Take 20 mg by mouth.     [name] glucose blood (ONE TOUCH TEST STRIPS) test strip Use to test blood sugars daily. Dx: e11.9 100 each 12  . levothyroxine (SYNTHROID, LEVOTHROID)  75 MCG tablet 75 mcg daily before breakfast.     . lisinopril (PRINIVIL,ZESTRIL) 20 MG tablet Take 40 mg by mouth daily.     . magnesium oxide (MAG-OX) 400 MG tablet Take 500 mg by mouth 2 (two) times daily.    . mycophenolate (CELLCEPT) 500 MG tablet Take 500 mg by mouth 2 (two) times daily.    [name] omeprazole (PRILOSEC) 20 MG capsule Take 20 mg by mouth daily.     . tacrolimus (PROGRAF) 1 MG capsule Take 1 mg by mouth 2 (two) times daily.    . tamsulosin (FLOMAX) 0.4 MG CAPS Take 0.4 mg by mouth daily.     No current facility-administered medications for this visit.    Objective: BP 130/64 mmHg  Pulse 101  Temp(Src) 98.6 F (37 C)  Wt 257 lb (116.574 kg) Gen: NAD, resting comfortably CV: RRR no murmurs rubs or gallops Lungs: CTAB no crackles, wheeze, rhonchi Abdomen: soft/nontender/nondistended/normal bowel sounds. No rebound or guarding. obese Ext: no edema Skin: warm, dry Neuro: grossly normal, moves all extremities  Assessment/Plan:  Essential hypertension S: controlled. Amlodipine 5mg , diltiazem, lasix 20mg , lisinopril 40mg , chlorthalidone 25mg . I had increased amlodipine to 10mg  at last visit but patient had some edema so transplant team went back to 5mg  and added chlorthalidone BP Readings from Last 3 Encounters:  08/29/15 130/64  12/16/14 140/58  06/19/14 142/70  A/P:Continue current  meds:  Well controlled.    Diabetes mellitus type II, controlled S: well controlled. On no medication previously with a1c of 6 CBGs- does not generally check Exercise and diet-  Over last year weight has trended up 14 lbs  Lab Results  Component Value Date   HGBA1C 8.0* 08/29/2015   HGBA1C 6.1 01/04/2015   HGBA1C 5.8 07/24/2014    A/P: Patient to see heart trasnplant team this week- asked through mychart that he asks their opinion on medication. I would consider metformin but wonder if increased chance of lactic acidosis given his comorbidities. Possible patient could control this if  gets weight back down but if he chooses this option would repeat in 3 months. We filled strips as patient would like to start checking more and thinks thi swill help him.   Hyperlipidemia S: controlled on atorvastatin 80mg  though LDL has increased to 94 up from 69 with weight gain. No myalgias.  Lab Results  Component Value Date   CHOL 128 03/05/2008   HDL 24.7* 03/05/2008   LDLCALC 94 08/25/2015   LDLDIRECT 69 02/12/2014   TRIG 125 03/05/2008   CHOLHDL 5.2 CALC 03/05/2008   A/P: patient needs to work to get weight back down and hopefully LDL will swing back below 70 whih is prferred with his cardiac history.    Reports issues with Mild suprapubic pain- can get UA and urine culture if recurs- UTI vs. Prostatitis? None at this time so did not test  3 months due to DM poor control Return precautions advised.   Orders Placed This Encounter  Procedures  . Flu Vaccine QUAD 36+ mos IM  . Hemoglobin A1c    Meds ordered this encounter  Medications  . glucose blood (ONE TOUCH TEST STRIPS) test strip    Sig: Use to test blood sugars daily. Dx: e11.9    Dispense:  100 each    Refill:  12    Health Maintenance Due  Topic Date Due  . OPHTHALMOLOGY EXAM- send records 12/16/1965  . FOOT EXAM - normal 06/20/2015  . HEMOGLOBIN A1C - today 07/06/2015  Pneumovax repeat 06/2017. prevnar age 77

## 2015-08-30 LAB — HEMOGLOBIN A1C
Hgb A1c MFr Bld: 8 % — ABNORMAL HIGH (ref <5.7)
MEAN PLASMA GLUCOSE: 183 mg/dL — AB (ref <117)

## 2015-09-01 NOTE — Assessment & Plan Note (Signed)
S: controlled. Amlodipine 5mg , diltiazem, lasix 20mg , lisinopril 40mg , chlorthalidone 25mg . I had increased amlodipine to 10mg  at last visit but patient had some edema so transplant team went back to 5mg  and added chlorthalidone BP Readings from Last 3 Encounters:  08/29/15 130/64  12/16/14 140/58  06/19/14 142/70  A/P:Continue current meds:  Well controlled.

## 2015-09-01 NOTE — Assessment & Plan Note (Signed)
S: controlled on atorvastatin 80mg  though LDL has increased to 94 up from 69 with weight gain. No myalgias.  Lab Results  Component Value Date   CHOL 128 03/05/2008   HDL 24.7* 03/05/2008   LDLCALC 94 08/25/2015   LDLDIRECT 69 02/12/2014   TRIG 125 03/05/2008   CHOLHDL 5.2 CALC 03/05/2008   A/P: patient needs to work to get weight back down and hopefully LDL will swing back below 70 whih is prferred with his cardiac history.

## 2015-09-01 NOTE — Assessment & Plan Note (Addendum)
S: well controlled. On no medication previously with a1c of 6 CBGs- does not generally check Exercise and diet-  Over last year weight has trended up 14 lbs  Lab Results  Component Value Date   HGBA1C 8.0* 08/29/2015   HGBA1C 6.1 01/04/2015   HGBA1C 5.8 07/24/2014    A/P: Patient to see heart trasnplant team this week- asked through mychart that he asks their opinion on medication. I would consider metformin but wonder if increased chance of lactic acidosis given his comorbidities. Possible patient could control this if gets weight back down but if he chooses this option would repeat in 3 months. We filled strips as patient would like to start checking more and thinks thi swill help him.

## 2015-09-04 DIAGNOSIS — Z95810 Presence of automatic (implantable) cardiac defibrillator: Secondary | ICD-10-CM | POA: Diagnosis not present

## 2015-09-04 DIAGNOSIS — E119 Type 2 diabetes mellitus without complications: Secondary | ICD-10-CM | POA: Diagnosis not present

## 2015-09-04 DIAGNOSIS — I2510 Atherosclerotic heart disease of native coronary artery without angina pectoris: Secondary | ICD-10-CM | POA: Diagnosis not present

## 2015-09-04 DIAGNOSIS — Z7982 Long term (current) use of aspirin: Secondary | ICD-10-CM | POA: Diagnosis not present

## 2015-09-04 DIAGNOSIS — E039 Hypothyroidism, unspecified: Secondary | ICD-10-CM | POA: Diagnosis not present

## 2015-09-04 DIAGNOSIS — Z87891 Personal history of nicotine dependence: Secondary | ICD-10-CM | POA: Diagnosis not present

## 2015-09-04 DIAGNOSIS — Z09 Encounter for follow-up examination after completed treatment for conditions other than malignant neoplasm: Secondary | ICD-10-CM | POA: Diagnosis not present

## 2015-09-04 DIAGNOSIS — Z79899 Other long term (current) drug therapy: Secondary | ICD-10-CM | POA: Diagnosis not present

## 2015-09-04 DIAGNOSIS — Z941 Heart transplant status: Secondary | ICD-10-CM | POA: Diagnosis not present

## 2015-09-04 DIAGNOSIS — E782 Mixed hyperlipidemia: Secondary | ICD-10-CM | POA: Diagnosis not present

## 2015-10-13 DIAGNOSIS — R7989 Other specified abnormal findings of blood chemistry: Secondary | ICD-10-CM | POA: Diagnosis not present

## 2015-10-13 DIAGNOSIS — Z941 Heart transplant status: Secondary | ICD-10-CM | POA: Diagnosis not present

## 2015-10-13 DIAGNOSIS — Z79899 Other long term (current) drug therapy: Secondary | ICD-10-CM | POA: Diagnosis not present

## 2015-10-13 DIAGNOSIS — Z48298 Encounter for aftercare following other organ transplant: Secondary | ICD-10-CM | POA: Diagnosis not present

## 2015-12-22 ENCOUNTER — Telehealth: Payer: Self-pay | Admitting: Family Medicine

## 2015-12-22 ENCOUNTER — Ambulatory Visit (INDEPENDENT_AMBULATORY_CARE_PROVIDER_SITE_OTHER): Payer: Commercial Managed Care - HMO | Admitting: Adult Health

## 2015-12-22 ENCOUNTER — Encounter: Payer: Self-pay | Admitting: Adult Health

## 2015-12-22 VITALS — BP 124/58 | Temp 98.0°F | Wt 214.8 lb

## 2015-12-22 DIAGNOSIS — R7309 Other abnormal glucose: Secondary | ICD-10-CM | POA: Diagnosis not present

## 2015-12-22 DIAGNOSIS — R05 Cough: Secondary | ICD-10-CM | POA: Diagnosis not present

## 2015-12-22 DIAGNOSIS — R058 Other specified cough: Secondary | ICD-10-CM

## 2015-12-22 LAB — POCT GLYCOSYLATED HEMOGLOBIN (HGB A1C): HEMOGLOBIN A1C: 13.5

## 2015-12-22 MED ORDER — DOXYCYCLINE HYCLATE 100 MG PO CAPS: 100.0000 mg | ORAL_CAPSULE | Freq: Two times a day (BID) | ORAL | Status: DC

## 2015-12-22 MED ORDER — METFORMIN HCL 1000 MG PO TABS: 1000.0000 mg | ORAL_TABLET | Freq: Two times a day (BID) | ORAL | Status: DC

## 2015-12-22 NOTE — Patient Instructions (Signed)
I have sent in a prescription for Doxycycline for your cough. You can also use Mucinex Cough   I have also increased your Metformin from 500 mg to 1000mg  BID  Follow up with Dr. Yong Channel in one week.

## 2015-12-22 NOTE — Telephone Encounter (Signed)
noted 

## 2015-12-22 NOTE — Progress Notes (Signed)
Subjective:    Patient ID: [name], male    DOB: [birth year], 60 y.o.   MRN: [id number]  Cough This is a new problem. The current episode started in the past 7 days. The problem has been gradually worsening. The problem occurs every few minutes. The cough is productive of sputum. Associated symptoms include headaches, shortness of breath and wheezing. Treatments tried: [place] Flu  The treatment provided mild relief. There is no history of asthma, bronchiectasis, bronchitis, COPD, emphysema or pneumonia.    He also complains of elevated blood sugars. This has been going on since before January. His last A1c was 8.1 in December. He was placed on Metformin 500mg  BID. His blood sugars per his app, have been in the 400-500's.   His diet is not good and he is eating a lot of carbs.   Review of Systems  Constitutional: Negative.   Respiratory: Positive for cough, shortness of breath and wheezing.   Cardiovascular: Negative.   Gastrointestinal: Negative.   Genitourinary: Negative.   Neurological: Positive for headaches.  Hematological: Negative.    Past Medical History  Diagnosis Date  . HYPERLIPIDEMIA 04/25/2007  . OBESITY 02/26/2009  . ANEMIA, OTHER UNSPEC 04/03/2008  . MYOCARDIAL INFARCTION, 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  . CORONARY ARTERY DISEASE 04/25/2007  . 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  . VENTRICULAR TACHYCARDIA 02/26/2009  . Acute on chronic systolic heart failure ([place]) 03/02/2009  . Abdominal pain, epigastric 03/31/2009  . THYROID DISEASE, HX OF 05/14/2008  . HYPERTENSION, HX OF 05/14/2008  . 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 DIABETES MELLITUS, TYPE II 12/13/2008    no meds, diet contolled  . Thyroid disease   . Blood transfusion without reported diagnosis   . CHF (congestive heart failure) ([place])   . GERD (gastroesophageal reflux disease)   . Hypertension     Social History   Social History  . Marital Status: Single    Spouse Name: N/A  . Number of  Children: 1  . Years of Education: N/A   Occupational History  . Not on file.   Social History Main Topics  . Smoking status: Former Smoker -- 1.00 packs/day for 20 years    Types: Cigarettes    Quit date: 07/02/1999  . Smokeless tobacco: Never Used  . Alcohol Use: Yes     Comment: rarely  . Drug Use: No  . Sexual Activity: Not on file   Other Topics Concern  . Not on file   Social History Narrative   Single. Divorced. 1 son. 1 granddaughter 2014.       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       Hobbies: time on CPU             Past Surgical History  Procedure Laterality Date  . Coronary artery bypass graft    . Cardiac catheterization    . [facility]  [facility]    [facility]  . Angioplasty      with stent  . Cardiac assist device removal    . Heart transplant  07/18/2012  . Colonoscopy      Family History  Problem Relation Age of Onset  . Heart disease Mother   . Lung cancer Maternal Grandfather   . Lung cancer Maternal Grandmother   . Colon cancer Neg Hx   . Esophageal cancer Neg Hx   . Prostate cancer Neg Hx   . Rectal cancer Neg Hx   . Stomach  cancer Neg Hx     No Known Allergies  Current Outpatient Prescriptions on File Prior to Visit  Medication Sig Dispense Refill  . amLODipine (NORVASC) 10 MG tablet Take 1 tablet (10 mg total) by mouth daily. 30 tablet 11  . aspirin EC 81 MG tablet Take 81 mg by mouth daily.    [name] atorvastatin (LIPITOR) 80 MG tablet Take 80 mg by mouth daily.     . cholecalciferol (VITAMIN D) 1000 UNITS tablet Take 3,000 Units by mouth daily.    [name] diltiazem (TIAZAC) 180 MG 24 hr capsule Take 360 mg by mouth daily.    . furosemide (LASIX) 20 MG tablet Take 20 mg by mouth.     [name] glucose blood (ONE TOUCH TEST STRIPS) test strip Use to test blood sugars daily. Dx: e11.9 100 each 12  . levothyroxine (SYNTHROID, LEVOTHROID) 75 MCG tablet 75 mcg daily before breakfast.     . lisinopril  (PRINIVIL,ZESTRIL) 20 MG tablet Take 40 mg by mouth daily.     . magnesium oxide (MAG-OX) 400 MG tablet Take 500 mg by mouth 2 (two) times daily.    . mycophenolate (CELLCEPT) 500 MG tablet Take 500 mg by mouth 2 (two) times daily.    [name] omeprazole (PRILOSEC) 20 MG capsule Take 20 mg by mouth daily.     . tacrolimus (PROGRAF) 1 MG capsule Take 1 mg by mouth 2 (two) times daily.    . tamsulosin (FLOMAX) 0.4 MG CAPS Take 0.4 mg by mouth daily.     No current facility-administered medications on file prior to visit.    BP 124/58 mmHg  Temp(Src) 98 F (36.7 C) (Oral)  Wt 214 lb 12.8 oz (97.433 kg)       Objective:   Physical Exam  Constitutional: He is oriented to person, place, and time. He appears well-developed and well-nourished. No distress.  Cardiovascular: Normal rate, regular rhythm, normal heart sounds and intact distal pulses.  Exam reveals no gallop and no friction rub.   No murmur heard. Pulmonary/Chest: Effort normal. No respiratory distress. He has wheezes in the right upper field, the right middle field, the right lower field, the left upper field, the left middle field and the left lower field. He has no rhonchi. He has no rales. He exhibits no tenderness.  Neurological: He is alert and oriented to person, place, and time.  Skin: Skin is warm and dry. No rash noted. He is not diaphoretic. No erythema. No pallor.  Psychiatric: He has a normal mood and affect. His behavior is normal. Judgment and thought content normal.  Nursing note and vitals reviewed.     Assessment & Plan:  1. Productive cough - No fevers and not feeling acutely ill. I doubt this is pneumonia but need to r/o it due to [place]. He does not have a documented history of asthma or bronchitis. He does have CHF but he did not sound "wet" - doxycycline (VIBRAMYCIN) 100 MG capsule; Take 1 capsule (100 mg total) by mouth 2 (two) times daily.  Dispense: 14 capsule; Refill: 0 - DG Chest 2 View; Future  2. Hemoglobin  A1c above reference range -  - metFORMIN (GLUCOPHAGE) 1000 MG tablet; Take 1 tablet (1,000 mg total) by mouth 2 (two) times daily with a meal.  Dispense: 180 tablet; Refill: 3 - POC HgB A1c- 13.5   Dorothyann Peng, NP

## 2015-12-22 NOTE — Telephone Encounter (Signed)
Patient Name: [name] DOB: October 09, 1955 Initial Comment caller states his blood sugar was 490 this am and it is now 353 - and has a cough Nurse Assessment Nurse: Ronnald Ramp, RN, Miranda Date/Time (Eastern Time): 12/22/2015 1:12:26 PM Confirm and document reason for call. If symptomatic, describe symptoms. You must click the next button to save text entered. ---Caller states his BS has been high the last few days. This morning it was 493 but down to 353 (has been running 300-400). Has had a cough for the last few days. Has the patient traveled out of the country within the last 30 days? ---No Does the patient have any new or worsening symptoms? ---Yes Will a triage be completed? ---Yes Related visit to physician within the last 2 weeks? ---No Does the PT have any chronic conditions? (i.e. diabetes, asthma, etc.) ---Yes List chronic conditions. ---Diabetes, Hx of heart transplant Is this a behavioral health or substance abuse call? ---No Guidelines Guideline Title Affirmed Question Affirmed Notes Cough - Acute Productive SEVERE coughing spells (e.g., whooping sound after coughing, vomiting after coughing) Diabetes - High Blood Sugar [1] Blood glucose > 300 mg/dl (16.5 mmol/ l) AND [2] two or more times in a row Final Disposition User See Physician within 24 Hours Ronnald Ramp, Therapist, sports, Miranda Comments No appt available with PCP, Appt scheduled for 3:45 pm with [employer]. Referrals REFERRED TO PCP OFFICE Disagree/Comply: Comply Disagree/Comply: Comply

## 2015-12-23 ENCOUNTER — Ambulatory Visit (INDEPENDENT_AMBULATORY_CARE_PROVIDER_SITE_OTHER)
Admission: RE | Admit: 2015-12-23 | Discharge: 2015-12-23 | Disposition: A | Discharge status: Home / Self Care | Payer: Commercial Managed Care - HMO | Source: Ambulatory Visit | Attending: Adult Health | Admitting: Adult Health

## 2015-12-23 DIAGNOSIS — R05 Cough: Secondary | ICD-10-CM | POA: Diagnosis not present

## 2015-12-23 DIAGNOSIS — R058 Other specified cough: Secondary | ICD-10-CM

## 2015-12-26 ENCOUNTER — Encounter (HOSPITAL_COMMUNITY): Payer: Self-pay | Admitting: Emergency Medicine

## 2015-12-26 ENCOUNTER — Inpatient Hospital Stay (HOSPITAL_COMMUNITY)
Admission: EM | Admit: 2015-12-26 | Discharge: 2015-12-30 | DRG: 682 | Disposition: A | Discharge status: Home Health | Payer: Commercial Managed Care - HMO | Attending: Family Medicine | Admitting: Family Medicine

## 2015-12-26 DIAGNOSIS — N400 Enlarged prostate without lower urinary tract symptoms: Secondary | ICD-10-CM | POA: Diagnosis not present

## 2015-12-26 DIAGNOSIS — E1165 Type 2 diabetes mellitus with hyperglycemia: Secondary | ICD-10-CM | POA: Diagnosis not present

## 2015-12-26 DIAGNOSIS — Z6831 Body mass index (BMI) 31.0-31.9, adult: Secondary | ICD-10-CM | POA: Diagnosis not present

## 2015-12-26 DIAGNOSIS — E1310 Other specified diabetes mellitus with ketoacidosis without coma: Secondary | ICD-10-CM | POA: Diagnosis not present

## 2015-12-26 DIAGNOSIS — I10 Essential (primary) hypertension: Secondary | ICD-10-CM | POA: Diagnosis present

## 2015-12-26 DIAGNOSIS — Z87891 Personal history of nicotine dependence: Secondary | ICD-10-CM

## 2015-12-26 DIAGNOSIS — I2510 Atherosclerotic heart disease of native coronary artery without angina pectoris: Secondary | ICD-10-CM | POA: Diagnosis present

## 2015-12-26 DIAGNOSIS — E1169 Type 2 diabetes mellitus with other specified complication: Secondary | ICD-10-CM | POA: Diagnosis present

## 2015-12-26 DIAGNOSIS — Z95810 Presence of automatic (implantable) cardiac defibrillator: Secondary | ICD-10-CM | POA: Diagnosis not present

## 2015-12-26 DIAGNOSIS — I5022 Chronic systolic (congestive) heart failure: Secondary | ICD-10-CM | POA: Diagnosis present

## 2015-12-26 DIAGNOSIS — E1110 Type 2 diabetes mellitus with ketoacidosis without coma: Secondary | ICD-10-CM | POA: Diagnosis present

## 2015-12-26 DIAGNOSIS — Z79899 Other long term (current) drug therapy: Secondary | ICD-10-CM | POA: Diagnosis not present

## 2015-12-26 DIAGNOSIS — N179 Acute kidney failure, unspecified: Secondary | ICD-10-CM | POA: Diagnosis not present

## 2015-12-26 DIAGNOSIS — I110 Hypertensive heart disease with heart failure: Secondary | ICD-10-CM | POA: Diagnosis not present

## 2015-12-26 DIAGNOSIS — E872 Acidosis: Secondary | ICD-10-CM | POA: Diagnosis not present

## 2015-12-26 DIAGNOSIS — Z7982 Long term (current) use of aspirin: Secondary | ICD-10-CM

## 2015-12-26 DIAGNOSIS — E1159 Type 2 diabetes mellitus with other circulatory complications: Secondary | ICD-10-CM | POA: Diagnosis present

## 2015-12-26 DIAGNOSIS — E785 Hyperlipidemia, unspecified: Secondary | ICD-10-CM | POA: Diagnosis not present

## 2015-12-26 DIAGNOSIS — E876 Hypokalemia: Secondary | ICD-10-CM | POA: Diagnosis not present

## 2015-12-26 DIAGNOSIS — E039 Hypothyroidism, unspecified: Secondary | ICD-10-CM | POA: Diagnosis present

## 2015-12-26 DIAGNOSIS — Z7984 Long term (current) use of oral hypoglycemic drugs: Secondary | ICD-10-CM | POA: Diagnosis not present

## 2015-12-26 DIAGNOSIS — K219 Gastro-esophageal reflux disease without esophagitis: Secondary | ICD-10-CM | POA: Diagnosis present

## 2015-12-26 DIAGNOSIS — I252 Old myocardial infarction: Secondary | ICD-10-CM

## 2015-12-26 DIAGNOSIS — Z941 Heart transplant status: Secondary | ICD-10-CM

## 2015-12-26 DIAGNOSIS — E669 Obesity, unspecified: Secondary | ICD-10-CM | POA: Diagnosis present

## 2015-12-26 DIAGNOSIS — R109 Unspecified abdominal pain: Secondary | ICD-10-CM | POA: Diagnosis not present

## 2015-12-26 DIAGNOSIS — I25811 Atherosclerosis of native coronary artery of transplanted heart without angina pectoris: Secondary | ICD-10-CM | POA: Diagnosis present

## 2015-12-26 DIAGNOSIS — R102 Pelvic and perineal pain: Secondary | ICD-10-CM

## 2015-12-26 DIAGNOSIS — R1024 Suprapubic pain: Secondary | ICD-10-CM

## 2015-12-26 DIAGNOSIS — E119 Type 2 diabetes mellitus without complications: Secondary | ICD-10-CM | POA: Diagnosis present

## 2015-12-26 DIAGNOSIS — I152 Hypertension secondary to endocrine disorders: Secondary | ICD-10-CM | POA: Diagnosis present

## 2015-12-26 DIAGNOSIS — K8020 Calculus of gallbladder without cholecystitis without obstruction: Secondary | ICD-10-CM | POA: Diagnosis not present

## 2015-12-26 DIAGNOSIS — E875 Hyperkalemia: Secondary | ICD-10-CM | POA: Diagnosis present

## 2015-12-26 DIAGNOSIS — R5383 Other fatigue: Secondary | ICD-10-CM | POA: Diagnosis not present

## 2015-12-26 DIAGNOSIS — G8929 Other chronic pain: Secondary | ICD-10-CM | POA: Diagnosis present

## 2015-12-26 LAB — CBC WITH DIFFERENTIAL/PLATELET
BASOS ABS: 0 10*3/uL (ref 0.0–0.1)
BASOS PCT: 0 %
Eosinophils Absolute: 0 10*3/uL (ref 0.0–0.7)
Eosinophils Relative: 0 %
HCT: 40.7 % (ref 39.0–52.0)
HEMOGLOBIN: 13.5 g/dL (ref 13.0–17.0)
Lymphocytes Relative: 5 %
Lymphs Abs: 0.6 10*3/uL — ABNORMAL LOW (ref 0.7–4.0)
MCH: 30.7 pg (ref 26.0–34.0)
MCHC: 33.2 g/dL (ref 30.0–36.0)
MCV: 92.5 fL (ref 78.0–100.0)
Monocytes Absolute: 0.6 10*3/uL (ref 0.1–1.0)
Monocytes Relative: 5 %
NEUTROS ABS: 10.2 10*3/uL — AB (ref 1.7–7.7)
NEUTROS PCT: 90 %
Platelets: 381 10*3/uL (ref 150–400)
RBC: 4.4 MIL/uL (ref 4.22–5.81)
RDW: 13.1 % (ref 11.5–15.5)
WBC: 11.4 10*3/uL — AB (ref 4.0–10.5)

## 2015-12-26 LAB — COMPREHENSIVE METABOLIC PANEL
ALBUMIN: 3 g/dL — AB (ref 3.5–5.0)
ALK PHOS: 103 U/L (ref 38–126)
ALT: 10 U/L — ABNORMAL LOW (ref 17–63)
AST: 7 U/L — AB (ref 15–41)
Anion gap: 28 — ABNORMAL HIGH (ref 5–15)
BILIRUBIN TOTAL: 1.9 mg/dL — AB (ref 0.3–1.2)
BUN: 78 mg/dL — AB (ref 6–20)
CALCIUM: 10 mg/dL (ref 8.9–10.3)
CO2: 9 mmol/L — AB (ref 22–32)
Chloride: 86 mmol/L — ABNORMAL LOW (ref 101–111)
Creatinine, Ser: 2.9 mg/dL — ABNORMAL HIGH (ref 0.61–1.24)
GFR calc Af Amer: 26 mL/min — ABNORMAL LOW (ref >60)
GFR calc non Af Amer: 22 mL/min — ABNORMAL LOW (ref >60)
GLUCOSE: 683 mg/dL — AB (ref 65–99)
POTASSIUM: 5.4 mmol/L — AB (ref 3.5–5.1)
Sodium: 123 mmol/L — ABNORMAL LOW (ref 135–145)
TOTAL PROTEIN: 7.6 g/dL (ref 6.5–8.1)

## 2015-12-26 NOTE — ED Notes (Signed)
Pt. reports poor appetite with fatigue and generalized weakness for 1 week , mild SOB , no nausea or emesis .

## 2015-12-27 ENCOUNTER — Encounter (HOSPITAL_COMMUNITY): Payer: Self-pay | Admitting: Internal Medicine

## 2015-12-27 DIAGNOSIS — E039 Hypothyroidism, unspecified: Secondary | ICD-10-CM | POA: Diagnosis present

## 2015-12-27 DIAGNOSIS — N400 Enlarged prostate without lower urinary tract symptoms: Secondary | ICD-10-CM | POA: Diagnosis present

## 2015-12-27 DIAGNOSIS — N179 Acute kidney failure, unspecified: Secondary | ICD-10-CM | POA: Diagnosis present

## 2015-12-27 DIAGNOSIS — Z7984 Long term (current) use of oral hypoglycemic drugs: Secondary | ICD-10-CM | POA: Diagnosis not present

## 2015-12-27 DIAGNOSIS — E1310 Other specified diabetes mellitus with ketoacidosis without coma: Secondary | ICD-10-CM | POA: Diagnosis present

## 2015-12-27 DIAGNOSIS — R109 Unspecified abdominal pain: Secondary | ICD-10-CM | POA: Diagnosis present

## 2015-12-27 DIAGNOSIS — Z79899 Other long term (current) drug therapy: Secondary | ICD-10-CM | POA: Diagnosis not present

## 2015-12-27 DIAGNOSIS — K219 Gastro-esophageal reflux disease without esophagitis: Secondary | ICD-10-CM | POA: Diagnosis present

## 2015-12-27 DIAGNOSIS — Z7982 Long term (current) use of aspirin: Secondary | ICD-10-CM | POA: Diagnosis not present

## 2015-12-27 DIAGNOSIS — E875 Hyperkalemia: Secondary | ICD-10-CM | POA: Diagnosis present

## 2015-12-27 DIAGNOSIS — I110 Hypertensive heart disease with heart failure: Secondary | ICD-10-CM | POA: Diagnosis present

## 2015-12-27 DIAGNOSIS — G8929 Other chronic pain: Secondary | ICD-10-CM | POA: Diagnosis present

## 2015-12-27 DIAGNOSIS — I25811 Atherosclerosis of native coronary artery of transplanted heart without angina pectoris: Secondary | ICD-10-CM | POA: Diagnosis present

## 2015-12-27 DIAGNOSIS — E785 Hyperlipidemia, unspecified: Secondary | ICD-10-CM | POA: Diagnosis present

## 2015-12-27 DIAGNOSIS — Z95810 Presence of automatic (implantable) cardiac defibrillator: Secondary | ICD-10-CM | POA: Diagnosis not present

## 2015-12-27 DIAGNOSIS — E669 Obesity, unspecified: Secondary | ICD-10-CM | POA: Diagnosis present

## 2015-12-27 DIAGNOSIS — I252 Old myocardial infarction: Secondary | ICD-10-CM | POA: Diagnosis not present

## 2015-12-27 DIAGNOSIS — E1110 Type 2 diabetes mellitus with ketoacidosis without coma: Secondary | ICD-10-CM | POA: Diagnosis present

## 2015-12-27 DIAGNOSIS — R5383 Other fatigue: Secondary | ICD-10-CM | POA: Diagnosis present

## 2015-12-27 DIAGNOSIS — Z87891 Personal history of nicotine dependence: Secondary | ICD-10-CM | POA: Diagnosis not present

## 2015-12-27 DIAGNOSIS — I5022 Chronic systolic (congestive) heart failure: Secondary | ICD-10-CM | POA: Diagnosis present

## 2015-12-27 DIAGNOSIS — Z6831 Body mass index (BMI) 31.0-31.9, adult: Secondary | ICD-10-CM | POA: Diagnosis not present

## 2015-12-27 DIAGNOSIS — E1165 Type 2 diabetes mellitus with hyperglycemia: Secondary | ICD-10-CM | POA: Diagnosis not present

## 2015-12-27 DIAGNOSIS — E876 Hypokalemia: Secondary | ICD-10-CM | POA: Diagnosis not present

## 2015-12-27 LAB — BASIC METABOLIC PANEL
ANION GAP: 28 — AB (ref 5–15)
ANION GAP: 17 — AB (ref 5–15)
ANION GAP: 13 (ref 5–15)
Anion gap: 11 (ref 5–15)
BUN: 82 mg/dL — ABNORMAL HIGH (ref 6–20)
BUN: 73 mg/dL — ABNORMAL HIGH (ref 6–20)
BUN: 61 mg/dL — ABNORMAL HIGH (ref 6–20)
BUN: 55 mg/dL — ABNORMAL HIGH (ref 6–20)
CALCIUM: 8.8 mg/dL — AB (ref 8.9–10.3)
CHLORIDE: 88 mmol/L — AB (ref 101–111)
CHLORIDE: 100 mmol/L — AB (ref 101–111)
CHLORIDE: 103 mmol/L (ref 101–111)
CHLORIDE: 102 mmol/L (ref 101–111)
CO2: 9 mmol/L — AB (ref 22–32)
CO2: 15 mmol/L — ABNORMAL LOW (ref 22–32)
CO2: 18 mmol/L — ABNORMAL LOW (ref 22–32)
CO2: 17 mmol/L — AB (ref 22–32)
CREATININE: 1.61 mg/dL — AB (ref 0.61–1.24)
Calcium: 9.4 mg/dL (ref 8.9–10.3)
Calcium: 8.9 mg/dL (ref 8.9–10.3)
Calcium: 9 mg/dL (ref 8.9–10.3)
Creatinine, Ser: 2.96 mg/dL — ABNORMAL HIGH (ref 0.61–1.24)
Creatinine, Ser: 2.13 mg/dL — ABNORMAL HIGH (ref 0.61–1.24)
Creatinine, Ser: 1.51 mg/dL — ABNORMAL HIGH (ref 0.61–1.24)
GFR calc non Af Amer: 22 mL/min — ABNORMAL LOW (ref >60)
GFR calc non Af Amer: 32 mL/min — ABNORMAL LOW (ref >60)
GFR calc non Af Amer: 45 mL/min — ABNORMAL LOW (ref >60)
GFR, EST AFRICAN AMERICAN: 25 mL/min — AB (ref >60)
GFR, EST AFRICAN AMERICAN: 37 mL/min — AB (ref >60)
GFR, EST AFRICAN AMERICAN: 57 mL/min — AB (ref >60)
GFR, EST AFRICAN AMERICAN: 52 mL/min — AB (ref >60)
GFR, EST NON AFRICAN AMERICAN: 49 mL/min — AB (ref >60)
Glucose, Bld: 692 mg/dL (ref 65–99)
Glucose, Bld: 333 mg/dL — ABNORMAL HIGH (ref 65–99)
Glucose, Bld: 146 mg/dL — ABNORMAL HIGH (ref 65–99)
Glucose, Bld: 213 mg/dL — ABNORMAL HIGH (ref 65–99)
POTASSIUM: 3.5 mmol/L (ref 3.5–5.1)
POTASSIUM: 3.7 mmol/L (ref 3.5–5.1)
Potassium: 5.8 mmol/L — ABNORMAL HIGH (ref 3.5–5.1)
Potassium: 4.1 mmol/L (ref 3.5–5.1)
SODIUM: 132 mmol/L — AB (ref 135–145)
SODIUM: 132 mmol/L — AB (ref 135–145)
Sodium: 125 mmol/L — ABNORMAL LOW (ref 135–145)
Sodium: 132 mmol/L — ABNORMAL LOW (ref 135–145)

## 2015-12-27 LAB — URINE MICROSCOPIC-ADD ON
Bacteria, UA: NONE SEEN
RBC / HPF: NONE SEEN RBC/hpf (ref 0–5)
SQUAMOUS EPITHELIAL / LPF: NONE SEEN

## 2015-12-27 LAB — URINALYSIS, ROUTINE W REFLEX MICROSCOPIC
HGB URINE DIPSTICK: NEGATIVE
Ketones, ur: 40 mg/dL — AB
Leukocytes, UA: NEGATIVE
Nitrite: NEGATIVE
PH: 5 (ref 5.0–8.0)
PROTEIN: NEGATIVE mg/dL
SPECIFIC GRAVITY, URINE: 1.023 (ref 1.005–1.030)

## 2015-12-27 LAB — GLUCOSE, CAPILLARY
GLUCOSE-CAPILLARY: 551 mg/dL — AB (ref 65–99)
GLUCOSE-CAPILLARY: 441 mg/dL — AB (ref 65–99)
GLUCOSE-CAPILLARY: 289 mg/dL — AB (ref 65–99)
GLUCOSE-CAPILLARY: 288 mg/dL — AB (ref 65–99)
GLUCOSE-CAPILLARY: 239 mg/dL — AB (ref 65–99)
GLUCOSE-CAPILLARY: 141 mg/dL — AB (ref 65–99)
GLUCOSE-CAPILLARY: 140 mg/dL — AB (ref 65–99)
GLUCOSE-CAPILLARY: 140 mg/dL — AB (ref 65–99)
GLUCOSE-CAPILLARY: 139 mg/dL — AB (ref 65–99)
GLUCOSE-CAPILLARY: 236 mg/dL — AB (ref 65–99)
Glucose-Capillary: 352 mg/dL — ABNORMAL HIGH (ref 65–99)
Glucose-Capillary: 195 mg/dL — ABNORMAL HIGH (ref 65–99)
Glucose-Capillary: 152 mg/dL — ABNORMAL HIGH (ref 65–99)
Glucose-Capillary: 140 mg/dL — ABNORMAL HIGH (ref 65–99)

## 2015-12-27 LAB — MRSA PCR SCREENING: MRSA BY PCR: NEGATIVE

## 2015-12-27 LAB — CBG MONITORING, ED

## 2015-12-27 LAB — PHOSPHORUS: PHOSPHORUS: 2.5 mg/dL (ref 2.5–4.6)

## 2015-12-27 MED ORDER — INSULIN GLARGINE 100 UNIT/ML ~~LOC~~ SOLN
14.0000 [IU] | Freq: Every day | SUBCUTANEOUS | Status: DC
  Administered 2015-12-27: 14 [IU] via SUBCUTANEOUS
  Filled 2015-12-27 (×2): qty 0.14

## 2015-12-27 MED ORDER — TACROLIMUS 0.5 MG PO CAPS
0.5000 mg | ORAL_CAPSULE | Freq: Two times a day (BID) | ORAL | Status: DC
  Administered 2015-12-27 – 2015-12-30 (×7): 0.5 mg via ORAL
  Filled 2015-12-27 (×9): qty 1

## 2015-12-27 MED ORDER — INSULIN ASPART 100 UNIT/ML ~~LOC~~ SOLN
0.0000 [IU] | Freq: Three times a day (TID) | SUBCUTANEOUS | Status: DC
  Administered 2015-12-27: 2 [IU] via SUBCUTANEOUS
  Administered 2015-12-28 (×2): 8 [IU] via SUBCUTANEOUS

## 2015-12-27 MED ORDER — INSULIN ASPART 100 UNIT/ML ~~LOC~~ SOLN
0.0000 [IU] | Freq: Every day | SUBCUTANEOUS | Status: DC
  Administered 2015-12-27: 2 [IU] via SUBCUTANEOUS

## 2015-12-27 MED ORDER — ENOXAPARIN SODIUM 30 MG/0.3ML ~~LOC~~ SOLN: 30.0000 mg | SUBCUTANEOUS | Status: DC

## 2015-12-27 MED ORDER — SODIUM CHLORIDE 0.9 % IV SOLN
INTRAVENOUS | Status: DC
  Administered 2015-12-27: 03:00:00 via INTRAVENOUS

## 2015-12-27 MED ORDER — BOOST / RESOURCE BREEZE PO LIQD: 1.0000 | Freq: Three times a day (TID) | ORAL | Status: DC

## 2015-12-27 MED ORDER — DEXTROSE-NACL 5-0.45 % IV SOLN: INTRAVENOUS | Status: DC

## 2015-12-27 MED ORDER — LEVOFLOXACIN IN D5W 500 MG/100ML IV SOLN
500.0000 mg | INTRAVENOUS | Status: DC
  Administered 2015-12-27: 500 mg via INTRAVENOUS
  Filled 2015-12-27: qty 100

## 2015-12-27 MED ORDER — SODIUM CHLORIDE 0.9 % IV SOLN
INTRAVENOUS | Status: DC
  Administered 2015-12-27: 05:00:00 via INTRAVENOUS

## 2015-12-27 MED ORDER — SODIUM CHLORIDE 0.9% FLUSH
3.0000 mL | Freq: Two times a day (BID) | INTRAVENOUS | Status: DC
  Administered 2015-12-27 – 2015-12-30 (×6): 3 mL via INTRAVENOUS

## 2015-12-27 MED ORDER — TAMSULOSIN HCL 0.4 MG PO CAPS
0.4000 mg | ORAL_CAPSULE | Freq: Every day | ORAL | Status: DC
  Administered 2015-12-27 – 2015-12-30 (×4): 0.4 mg via ORAL
  Filled 2015-12-27 (×4): qty 1

## 2015-12-27 MED ORDER — LEVOTHYROXINE SODIUM 75 MCG PO TABS
75.0000 ug | ORAL_TABLET | Freq: Every day | ORAL | Status: DC
  Administered 2015-12-28 – 2015-12-30 (×3): 75 ug via ORAL
  Filled 2015-12-27 (×3): qty 1

## 2015-12-27 MED ORDER — ENOXAPARIN SODIUM 40 MG/0.4ML ~~LOC~~ SOLN
40.0000 mg | SUBCUTANEOUS | Status: DC
Start: 2015-12-28 — End: 2015-12-30
  Administered 2015-12-28 – 2015-12-30 (×3): 40 mg via SUBCUTANEOUS
  Filled 2015-12-27 (×3): qty 0.4

## 2015-12-27 MED ORDER — INSULIN REGULAR HUMAN 100 UNIT/ML IJ SOLN
INTRAMUSCULAR | Status: AC
  Administered 2015-12-27: 5.4 [IU]/h via INTRAVENOUS
  Filled 2015-12-27: qty 2.5

## 2015-12-27 MED ORDER — SODIUM CHLORIDE 0.9 % IV BOLUS (SEPSIS)
2000.0000 mL | Freq: Once | INTRAVENOUS | Status: AC
  Administered 2015-12-27: 1000 mL via INTRAVENOUS

## 2015-12-27 MED ORDER — SODIUM CHLORIDE 0.9 % IV SOLN: INTRAVENOUS | Status: DC

## 2015-12-27 MED ORDER — ASPIRIN EC 81 MG PO TBEC
81.0000 mg | DELAYED_RELEASE_TABLET | Freq: Every day | ORAL | Status: DC
  Administered 2015-12-27 – 2015-12-30 (×4): 81 mg via ORAL
  Filled 2015-12-27 (×4): qty 1

## 2015-12-27 MED ORDER — LEVOFLOXACIN IN D5W 500 MG/100ML IV SOLN
500.0000 mg | INTRAVENOUS | Status: DC
  Filled 2015-12-27: qty 100

## 2015-12-27 MED ORDER — SODIUM CHLORIDE 0.9 % IV SOLN
INTRAVENOUS | Status: DC
  Administered 2015-12-27 – 2015-12-28 (×2): via INTRAVENOUS
  Administered 2015-12-29: 50 mL via INTRAVENOUS

## 2015-12-27 MED ORDER — DEXTROSE-NACL 5-0.45 % IV SOLN
INTRAVENOUS | Status: AC
  Administered 2015-12-27: 10:00:00 via INTRAVENOUS

## 2015-12-27 MED ORDER — MYCOPHENOLATE MOFETIL 250 MG PO CAPS
500.0000 mg | ORAL_CAPSULE | Freq: Two times a day (BID) | ORAL | Status: DC
  Administered 2015-12-27 – 2015-12-30 (×7): 500 mg via ORAL
  Filled 2015-12-27 (×9): qty 2

## 2015-12-27 MED ORDER — ENOXAPARIN SODIUM 30 MG/0.3ML ~~LOC~~ SOLN
30.0000 mg | SUBCUTANEOUS | Status: DC
  Administered 2015-12-27: 30 mg via SUBCUTANEOUS
  Filled 2015-12-27: qty 0.3

## 2015-12-27 MED ORDER — ATORVASTATIN CALCIUM 80 MG PO TABS
80.0000 mg | ORAL_TABLET | Freq: Every day | ORAL | Status: DC
  Administered 2015-12-27 – 2015-12-30 (×4): 80 mg via ORAL
  Filled 2015-12-27 (×4): qty 1

## 2015-12-27 NOTE — H&P (Signed)
History and Physical    DOIS BILLING [id number] DOB: 1955/11/23 DOA: 12/26/2015  Referring MD/NP/PA: Everlene Balls, MD PCP: Garret Reddish, MD  Outpatient Specialists:  Patient coming from: Home.  Chief Complaint: Fatigue and decreased appetite.  HPI: [name] is a 60 y.o. male with medical history significant 4 heart transplant,CAD, AICD placement, hypertension, hyperlipidemia, Hypothyroidism, type 2 diabetes, GERD Who comes to the emergency department with a six-day history of fatigue,nausea, but no vomiting, poor appetite, dyspnea, diarrhea, productive cough and mild dyspnea. He denies fever, chills, chest pain, palpitations, diaphoresis,pitting edema lower extremities, PND or orthopnea.He has had mild dizziness.  He was recently seen by his PCP and was given doxycycline to treat bronchitis. He denies travel history or sick contacts.   He also complains of RLQ pain for 2 months. He recently told his doctor, but states that this has not been worked up yet.  ED Course: In the ER, workup demonstrated an elevated anion gap, hyperglycemia, Hyperkalemia, worsening of BUN and creatinine consistent with AKI. He has received IV fluids, was started on insulin infusion and reports some improvement.   Review of Systems: As per HPI otherwise 10 point review of systems negative.    Past Medical History  Diagnosis Date  . HYPERLIPIDEMIA 04/25/2007  . OBESITY 02/26/2009  . ANEMIA, OTHER UNSPEC 04/03/2008  . MYOCARDIAL INFARCTION, 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  . CORONARY ARTERY DISEASE 04/25/2007  . 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  . VENTRICULAR TACHYCARDIA 02/26/2009  . Acute on chronic systolic heart failure ([place]) 03/02/2009  . Abdominal pain, epigastric 03/31/2009  . THYROID DISEASE, HX OF 05/14/2008  . HYPERTENSION, HX OF 05/14/2008  . 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 DIABETES MELLITUS, TYPE II 12/13/2008    no meds, diet contolled  . Thyroid disease   . Blood  transfusion without reported diagnosis   . CHF (congestive heart failure) ([place])   . GERD (gastroesophageal reflux disease)   . Hypertension     Past Surgical History  Procedure Laterality Date  . Coronary artery bypass graft    . Cardiac catheterization    . Left [facility] device  [facility]    [facility]  . Angioplasty      with stent  . Cardiac assist device removal    . Heart transplant  07/18/2012  . Colonoscopy       reports that he quit smoking about 16 years ago. His smoking use included Cigarettes. He has a 20 pack-year smoking history. He has never used smokeless tobacco. He reports that he drinks alcohol. He reports that he does not use illicit drugs.  No Known Allergies  Family History  Problem Relation Age of Onset  . Heart disease Mother   . Lung cancer Maternal Grandfather   . Lung cancer Maternal Grandmother   . Colon cancer Neg Hx   . Esophageal cancer Neg Hx   . Prostate cancer Neg Hx   . Rectal cancer Neg Hx   . Stomach cancer Neg Hx   Family history reviewed with the patient.   Prior to Admission medications   Medication Sig Start Date End Date Taking? Authorizing Provider  amLODipine (NORVASC) 10 MG tablet Take 1 tablet (10 mg total) by mouth daily. 12/16/14   Marin Olp, MD  aspirin EC 81 MG tablet Take 81 mg by mouth daily.    Historical Provider, MD  atorvastatin (LIPITOR) 80 MG tablet Take 80 mg by mouth daily.  06/05/11   Historical Provider, MD  cholecalciferol (VITAMIN D) 1000 UNITS tablet Take 3,000 Units by mouth daily.    Historical Provider, MD  diltiazem (TIAZAC) 180 MG 24 hr capsule Take 360 mg by mouth daily.    Historical Provider, MD  doxycycline (VIBRAMYCIN) 100 MG capsule Take 1 capsule (100 mg total) by mouth 2 (two) times daily. 12/22/15   Dorothyann Peng, NP  furosemide (LASIX) 20 MG tablet Take 20 mg by mouth.     Historical Provider, MD  glucose blood (ONE TOUCH TEST STRIPS) test strip Use to test blood sugars daily. Dx: e11.9  08/29/15   Marin Olp, MD  levothyroxine (SYNTHROID, LEVOTHROID) 75 MCG tablet 75 mcg daily before breakfast.  06/05/11   Historical Provider, MD  lisinopril (PRINIVIL,ZESTRIL) 20 MG tablet Take 40 mg by mouth daily.     Historical Provider, MD  magnesium oxide (MAG-OX) 400 MG tablet Take 500 mg by mouth 2 (two) times daily.    Historical Provider, MD  metFORMIN (GLUCOPHAGE) 1000 MG tablet Take 1 tablet (1,000 mg total) by mouth 2 (two) times daily with a meal. 12/22/15   Dorothyann Peng, NP  mycophenolate (CELLCEPT) 500 MG tablet Take 500 mg by mouth 2 (two) times daily.    Historical Provider, MD  omeprazole (PRILOSEC) 20 MG capsule Take 20 mg by mouth daily.  06/05/11   Historical Provider, MD  tacrolimus (PROGRAF) 1 MG capsule Take 1 mg by mouth 2 (two) times daily.    Historical Provider, MD  tamsulosin (FLOMAX) 0.4 MG CAPS Take 0.4 mg by mouth daily.    Historical Provider, MD    Physical Exam: Filed Vitals:   12/26/15 2300  BP: 117/52  Pulse: 95  Temp: 97.7 F (36.5 C)  TempSrc: Oral  Resp: 18  SpO2: 100%      Constitutional: NAD, calm, comfortable Filed Vitals:   12/26/15 2300  BP: 117/52  Pulse: 95  Temp: 97.7 F (36.5 C)  TempSrc: Oral  Resp: 18  SpO2: 100%   Eyes: PERRL, lids and conjunctivae normal ENMT: Mucous membranes are dry. Posterior pharynx clear of any exudate or lesions.Normal dentition.  Neck: normal, supple, no masses, no thyromegaly Respiratory: clear to auscultation bilaterally, no wheezing, no crackles. Normal respiratory effort. No accessory muscle use.  Cardiovascular: Regular rate and rhythm, no murmurs / rubs / gallops. No extremity edema. 2+ pedal pulses. No carotid bruits.  Abdomen: Positive periumbilical hernia, soft,no tenderness, no masses palpated. No hepatosplenomegaly. Bowel sounds positive.  Musculoskeletal: no clubbing / cyanosis. No joint deformity upper and lower extremities. Good ROM, no contractures. Normal muscle tone.  Skin: no  rashes, lesions, ulcers. No induration Neurologic: CN 2-12 grossly intact. Sensation intact, DTR normal. Strength 5/5 in all 4.  Psychiatric: Normal judgment and insight. Alert and oriented x 4. Normal mood.     Labs on Admission: I have personally reviewed following labs and imaging studies  CBC:  Recent Labs Lab 12/26/15 2311  WBC 11.4*  NEUTROABS 10.2*  HGB 13.5  HCT 40.7  MCV 92.5  PLT 123XX123   Basic Metabolic Panel:  Recent Labs Lab 12/26/15 2311  NA 123*  K 5.4*  CL 86*  CO2 9*  GLUCOSE 683*  BUN 78*  CREATININE 2.90*  CALCIUM 10.0   GFR: CrCl cannot be calculated (Unknown ideal weight.). Liver Function Tests:  Recent Labs Lab 12/26/15 2311  AST 7*  ALT 10*  ALKPHOS 103  BILITOT 1.9*  PROT 7.6  ALBUMIN 3.0*   Urine analysis:    Component Value  Date/Time   BILIRUBINUR neg 02/26/2014 1032   PROTEINUR 5.5 02/26/2014 1032   UROBILINOGEN 0.2 02/26/2014 1032   NITRITE pos 02/26/2014 1032   LEUKOCYTESUR small (1+) 02/26/2014 1032     Radiological Exams on Admission: No results found.  EKG: Independently reviewed. Vent. rate 95 BPM PR interval 132 ms QRS duration 86 ms QT/QTc 364/457 ms P-R-T axes 28 81 44 Normal sinus rhythm Normal ECG No significant change since last tracing  Assessment/Plan Principal Problem:   DKA (diabetic ketoacidoses) (HCC)   Diabetes mellitus type II, uncontrolled ([place])  Admit to stepdown/inpatient. Keep nothing by mouth. Hold metformin. Supplemental oxygen when necessary. Continue IV fluids. Antiemetic as needed. Continue insulin infusion. Monitor CBG, anion gap and electrolytes closely.  Active Problems:   AKI (acute kidney injury) ([place]) Continue vigorous IV hydration. Follow-up BUN and creatinine. Hold ACE inhibitor and diuretic. Considering expanding workup for nephrology evaluation if no improvement.      Hyperlipidemia Resume atorvastatin once the patient is tolerating oral intake.    Coronary  atherosclerosis Stable. No chest pain. Continue aspirin, atorvastatin and calcium channel blockers.    Heart transplanted ([place]) Resume Prograf and CellCept in a.m. once the patient is tolerating oral intake.    Hypothyroidism Continue levothyroxine 75 g by mouth daily. Monitor TSH periodically.    Essential hypertension Hold lisinopril. Hold furosemide. Resume amlodipine and Tiazac in a.m.The patient is tolerating oral intake. Monitor blood pressure.    BPH (benign prostatic hypertrophy) Resume Flomax in a.m. If the patient is tolerating oral intake.    GERD (gastroesophageal reflux disease) Continue PPI.     Chronic Abdominal Pain Ct scan abd/Pelvis non-contrast once the patient's DKA has resolved.    DVT prophylaxis: Lovenox SQ. Code Status: Full code. Family Communication: His mother was present in the room. Disposition Plan: Admit to SDU for DKA treatment. Consults called: None. Admission status: Inpatient/stepdown.   Reubin Milan MD [facility] Pager [phone].  If 7PM-7AM, please contact night-coverage [email or web address] Password [facility]  12/27/2015, 2:37 AM

## 2015-12-27 NOTE — ED Notes (Signed)
CHECKED CBG METER READS HI, RN BROOK INFORMED

## 2015-12-27 NOTE — ED Provider Notes (Signed)
CSN: [id number]     Arrival date & time 12/26/15  2251 History  By signing my name below, I, Irene Pap, attest that this documentation has been prepared under the direction and in the presence of Everlene Balls, MD. Electronically Signed: Irene Pap, ED Scribe. 12/27/2015. 1:49 AM.  Chief Complaint  Patient presents with  . Anorexia  . Fatigue   The history is provided by the patient. No language interpreter was used.  HPI Comments: GARIEL GLASTETTER is a 60 y.o. male with a hx of MI, coronary artery disease, ischemic heart disease, CHF, heart transplant, Type II DM, and HTN who presents to the Emergency Department complaining of poor appetite onset 6 days ago. Pt reports associated nausea, SOB, and diarrhea. Wife states that he has been drinking water and diet ginger ale. Pt states that he has not had any desire to eat. Pt was recently seen by his PCP and diagnosed with bronchitis. Wife reports that she has noticed confusion in the pt and pt states that his A1C has been up to 13.5. He denies fever, chest pain, or vomiting.   Past Medical History  Diagnosis Date  . HYPERLIPIDEMIA 04/25/2007  . OBESITY 02/26/2009  . ANEMIA, OTHER UNSPEC 04/03/2008  . MYOCARDIAL INFARCTION, 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  . CORONARY ARTERY DISEASE 04/25/2007  . 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  . VENTRICULAR TACHYCARDIA 02/26/2009  . Acute on chronic systolic heart failure ([place]) 03/02/2009  . Abdominal pain, epigastric 03/31/2009  . THYROID DISEASE, HX OF 05/14/2008  . HYPERTENSION, HX OF 05/14/2008  . 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 DIABETES MELLITUS, TYPE II 12/13/2008    no meds, diet contolled  . Thyroid disease   . Blood transfusion without reported diagnosis   . CHF (congestive heart failure) ([place])   . GERD (gastroesophageal reflux disease)   . Hypertension    Past Surgical History  Procedure Laterality Date  . Coronary artery bypass graft    . Cardiac catheterization    . Left  ventricular assist device  05/2009    [facility]  . Angioplasty      with stent  . Cardiac assist device removal    . Heart transplant  07/18/2012  . Colonoscopy     Family History  Problem Relation Age of Onset  . Heart disease Mother   . Lung cancer Maternal Grandfather   . Lung cancer Maternal Grandmother   . Colon cancer Neg Hx   . Esophageal cancer Neg Hx   . Prostate cancer Neg Hx   . Rectal cancer Neg Hx   . Stomach cancer Neg Hx    Social History  Substance Use Topics  . Smoking status: Former Smoker -- 1.00 packs/day for 20 years    Types: Cigarettes    Quit date: 07/02/1999  . Smokeless tobacco: Never Used  . Alcohol Use: Yes     Comment: rarely    Review of Systems 10 Systems reviewed and all are negative for acute change except as noted in the HPI.  Allergies  Review of patient's allergies indicates no known allergies.  Home Medications   Prior to Admission medications   Medication Sig Start Date End Date Taking? Authorizing Provider  amLODipine (NORVASC) 10 MG tablet Take 1 tablet (10 mg total) by mouth daily. 12/16/14   Marin Olp, MD  aspirin EC 81 MG tablet Take 81 mg by mouth daily.    Historical Provider, MD  atorvastatin (LIPITOR) 80 MG tablet Take 80 mg by  mouth daily.  06/05/11   Historical Provider, MD  cholecalciferol (VITAMIN D) 1000 UNITS tablet Take 3,000 Units by mouth daily.    Historical Provider, MD  diltiazem (TIAZAC) 180 MG 24 hr capsule Take 360 mg by mouth daily.    Historical Provider, MD  doxycycline (VIBRAMYCIN) 100 MG capsule Take 1 capsule (100 mg total) by mouth 2 (two) times daily. 12/22/15   Dorothyann Peng, NP  furosemide (LASIX) 20 MG tablet Take 20 mg by mouth.     Historical Provider, MD  glucose blood (ONE TOUCH TEST STRIPS) test strip Use to test blood sugars daily. Dx: e11.9 08/29/15   Marin Olp, MD  levothyroxine (SYNTHROID, LEVOTHROID) 75 MCG tablet 75 mcg daily before breakfast.  06/05/11   Historical Provider, MD   lisinopril (PRINIVIL,ZESTRIL) 20 MG tablet Take 40 mg by mouth daily.     Historical Provider, MD  magnesium oxide (MAG-OX) 400 MG tablet Take 500 mg by mouth 2 (two) times daily.    Historical Provider, MD  metFORMIN (GLUCOPHAGE) 1000 MG tablet Take 1 tablet (1,000 mg total) by mouth 2 (two) times daily with a meal. 12/22/15   Dorothyann Peng, NP  mycophenolate (CELLCEPT) 500 MG tablet Take 500 mg by mouth 2 (two) times daily.    Historical Provider, MD  omeprazole (PRILOSEC) 20 MG capsule Take 20 mg by mouth daily.  06/05/11   Historical Provider, MD  tacrolimus (PROGRAF) 1 MG capsule Take 1 mg by mouth 2 (two) times daily.    Historical Provider, MD  tamsulosin (FLOMAX) 0.4 MG CAPS Take 0.4 mg by mouth daily.    Historical Provider, MD   BP 117/52 mmHg  Pulse 95  Temp(Src) 97.7 F (36.5 C) (Oral)  Resp 18  SpO2 100% Physical Exam  Constitutional: He is oriented to person, place, and time. Vital signs are normal. He appears well-developed and well-nourished.  Non-toxic appearance. He does not appear ill. No distress.  HENT:  Head: Normocephalic and atraumatic.  Nose: Nose normal.  Mouth/Throat: Oropharynx is clear and moist. No oropharyngeal exudate.  Eyes: Conjunctivae and EOM are normal. Pupils are equal, round, and reactive to light. No scleral icterus.  Neck: Normal range of motion. Neck supple. No tracheal deviation, no edema, no erythema and normal range of motion present. No thyroid mass and no thyromegaly present.  Cardiovascular: Normal rate, regular rhythm, S1 normal, S2 normal, normal heart sounds, intact distal pulses and normal pulses.  Exam reveals no gallop and no friction rub.   No murmur heard. Pulmonary/Chest: Effort normal and breath sounds normal. No respiratory distress. He has no wheezes. He has no rhonchi. He has no rales.  Abdominal: Soft. Normal appearance and bowel sounds are normal. He exhibits no distension, no ascites and no mass. There is no hepatosplenomegaly.  There is no tenderness. There is no rebound, no guarding and no CVA tenderness. A hernia is present.  Periumbilical hernia easily reducible  Musculoskeletal: Normal range of motion. He exhibits no edema or tenderness.  Lymphadenopathy:    He has no cervical adenopathy.  Neurological: He is alert and oriented to person, place, and time. He has normal strength. No cranial nerve deficit or sensory deficit.  Skin: Skin is warm, dry and intact. No petechiae and no rash noted. He is not diaphoretic. No erythema. No pallor.  Psychiatric: He has a normal mood and affect. His behavior is normal. Judgment normal.  Nursing note and vitals reviewed.   ED Course  Procedures (including critical care time)  DIAGNOSTIC STUDIES: Oxygen Saturation is 100% on RA, normal by my interpretation.    COORDINATION OF CARE: 1:49 AM-Discussed treatment plan which includes labs with pt at bedside and pt agreed to plan.    Labs Review Labs Reviewed  CBC WITH DIFFERENTIAL/PLATELET - Abnormal; Notable for the following:    WBC 11.4 (*)    Neutro Abs 10.2 (*)    Lymphs Abs 0.6 (*)    All other components within normal limits  COMPREHENSIVE METABOLIC PANEL - Abnormal; Notable for the following:    Sodium 123 (*)    Potassium 5.4 (*)    Chloride 86 (*)    CO2 9 (*)    Glucose, Bld 683 (*)    BUN 78 (*)    Creatinine, Ser 2.90 (*)    Albumin 3.0 (*)    AST 7 (*)    ALT 10 (*)    Total Bilirubin 1.9 (*)    GFR calc non Af Amer 22 (*)    GFR calc Af Amer 26 (*)    Anion gap 28 (*)    All other components within normal limits  URINALYSIS, ROUTINE W REFLEX MICROSCOPIC (NOT AT [facility])    Imaging Review No results found. I have personally reviewed and evaluated these images and lab results as part of my medical decision-making.   EKG Interpretation   Date/Time:  Friday December 26 2015 23:00:58 EDT Ventricular Rate:  95 PR Interval:  132 QRS Duration: 86 QT Interval:  364 QTC Calculation: 457 R Axis:    81 Text Interpretation:  Normal sinus rhythm Normal ECG No significant change  since last tracing Confirmed by Glynn Octave [phone]) on 12/27/2015  2:09:11 AM      MDM   Final diagnoses:  None   Patient presents emergency department for polydipsia and lack of appetite. Laboratory studies reveals DKA.  His anion gap 28, CO2 is 9, glucose 683. He was given 2 L IV fluids and placed on insulin drip. [facility] admitted [facility] for further care.   CRITICAL CARE Performed by: Everlene Balls   Total critical care time: 40 minutes - DKA on insulin drip  Critical care time was exclusive of separately billable procedures and treating other patients.  Critical care was necessary to treat or prevent imminent or life-threatening deterioration.  Critical care was time spent personally by me on the following activities: development of treatment plan with patient and/or surrogate as well as nursing, discussions with consultants, evaluation of patient's response to treatment, examination of patient, obtaining history from patient or surrogate, ordering and performing treatments and interventions, ordering and review of laboratory studies, ordering and review of radiographic studies, pulse oximetry and re-evaluation of patient's condition.    I personally performed the services described in this documentation, which was scribed in my presence. The recorded information has been reviewed and is accurate.     Everlene Balls, MD 12/27/15 [phone]

## 2015-12-27 NOTE — Progress Notes (Signed)
Initial Nutrition Assessment  DOCUMENTATION CODES:   Obesity unspecified  INTERVENTION:   Boost Breeze po TID, each supplement provides 250 kcal and 9 grams of protein  NUTRITION DIAGNOSIS:   Inadequate oral intake related to poor appetite, nausea as evidenced by  (chart review)  GOAL:   Patient will meet greater than or equal to 90% of their needs  MONITOR:   PO intake, Labs, Weight trends, I & O's  REASON FOR ASSESSMENT:   Malnutrition Screening Tool  ASSESSMENT:   60 y.o. Male with a hx of MI, coronary artery disease, ischemic heart disease, CHF, heart transplant, Type II DM, and HTN who presents to the Emergency Department complaining of poor appetite onset 6 days ago. Pt reports associated nausea, SOB, and diarrhea. Wife states that he has been drinking water and diet ginger ale. Pt states that he has not had any desire to eat. Pt was recently seen by his PCP and diagnosed with bronchitis. Wife reports that she has noticed confusion in the pt and pt states that his A1C has been up to 13.5. He denies fever, chest pain, or vomiting.   Patient and wife sleeping upon RD visit; did not disturb. Per chart review, pt with nausea and no desire to eat recently. Laboratory studies revealed DKA. Would benefit from addition of oral nutrition supplements >> RD to order.  RD unable to complete Nutrition Focused Physical Exam at this time.  Diet Order:  Diet clear liquid Room service appropriate?: Yes; Fluid consistency:: Thin  Skin:  Reviewed, no issues  Last BM:  4/22  Height:   Ht Readings from Last 1 Encounters:  12/27/15 5\' 8"  (1.727 m)    Weight:   Wt Readings from Last 1 Encounters:  12/27/15 206 lb 9.1 oz (93.7 kg)    Ideal Body Weight:  70 kg  BMI:  Body mass index is 31.42 kg/(m^2).  Estimated Nutritional Needs:   Kcal:  2050-2250  Protein:  100-110 gm  Fluid:  2.0-2.2 L  EDUCATION NEEDS:   No education needs identified at this time  Arthur Holms, RD, LDN Pager #: [phone] After-Hours Pager #: [phone]

## 2015-12-27 NOTE — Progress Notes (Signed)
[place] TEAM 1 - Stepdown/ICU TEAM PROGRESS NOTE  [name] [id number] DOB: March 08, 1956 DOA: 12/26/2015 PCP: Garret Reddish, MD  Admit HPI / Brief Narrative: 60 y.o. male with history of heart transplant, CAD, AICD, HTN, HLD, Hypothyroidism, DM2, and GERD who came to the emergency department with a six-day history of fatigue, nausea with no vomiting, poor appetite, dyspnea, diarrhea, and productive cough. He was recently seen by his PCP and was given doxycycline to treat bronchitis.  He also complained of RLQ pain for 2 months.  In the ER, workup demonstrated an elevated anion gap, hyperglycemia, hyperkalemia, and AKI.   HPI/Subjective: Pt seen for f/u visit.  Assessment/Plan:  DKA - DM2, uncontrolled  AKI Hold ACE inhibitor and diuretic   Hyperlipidemia  Coronary atherosclerosis continue aspirin, atorvastatin and calcium channel blockers  Hx of Heart transplant resume Prograf and CellCept   Hypothyroidism Continue levothyroxine   HTN  BPH   GERD  Continue PPI  Chronic Abdominal Pain  Obesity - Body mass index is 31.42 kg/(m^2).  Code Status: FULL Family Communication: no family present at time of exam Disposition Plan: SDU while on insulin gtt  Consultants: none  Procedures: none  Antibiotics: none  DVT prophylaxis: lovenox   Objective: Blood pressure 140/62, pulse 98, temperature 97.9 F (36.6 C), temperature source Oral, resp. rate 18, height 5\' 8"  (1.727 m), weight 93.7 kg (206 lb 9.1 oz), SpO2 97 %.  Intake/Output Summary (Last 24 hours) at 12/27/15 1325 Last data filed at 12/27/15 1100  Gross per 24 hour  Intake 1630.93 ml  Output   1050 ml  Net 580.93 ml   Exam: Pt seen for f/u visit.  Data Reviewed: Basic Metabolic Panel:  Recent Labs Lab 12/26/15 2311 12/27/15 0256 12/27/15 0736  NA 123* 125* 132*  K 5.4* 5.8* 4.1  CL 86* 88* 100*  CO2 9* 9* 15*  GLUCOSE 683* 692* 333*  BUN 78* 82* 73*  CREATININE 2.90* 2.96*  2.13*  CALCIUM 10.0 9.4 8.8*  PHOS  --   --  2.5    CBC:  Recent Labs Lab 12/26/15 2311  WBC 11.4*  NEUTROABS 10.2*  HGB 13.5  HCT 40.7  MCV 92.5  PLT 381    Liver Function Tests:  Recent Labs Lab 12/26/15 2311  AST 7*  ALT 10*  ALKPHOS 103  BILITOT 1.9*  PROT 7.6  ALBUMIN 3.0*   CBG:  Recent Labs Lab 12/27/15 0855 12/27/15 0958 12/27/15 1101 12/27/15 1201 12/27/15 1304  GLUCAP 239* 195* 152* 140* 141*   Studies:   Recent x-ray studies have been reviewed in detail by the Attending Physician  Scheduled Meds:  Scheduled Meds: . aspirin EC  81 mg Oral Daily  . atorvastatin  80 mg Oral Daily  . enoxaparin (LOVENOX) injection  30 mg Subcutaneous Q24H  . [START ON 12/28/2015] levothyroxine  75 mcg Oral QAC breakfast  . mycophenolate  500 mg Oral BID  . sodium chloride flush  3 mL Intravenous Q12H  . tacrolimus  0.5 mg Oral BID  . tamsulosin  0.4 mg Oral Daily    Time spent on care of this patient: No charge   Cherene Altes , MD   [facility] Office  [phone] Pager - Text Page per Shea Evans as per below:  On-Call/Text Page:      [email or web address]      password [email or web address]  If 7PM-7AM, please contact night-coverage [email or web address] Password [email or web address] 12/27/2015, 1:25 PM   LOS: 0 days

## 2015-12-27 NOTE — ED Notes (Signed)
MD at bedside before RN assessment, see note.

## 2015-12-28 ENCOUNTER — Inpatient Hospital Stay (HOSPITAL_COMMUNITY): Payer: Commercial Managed Care - HMO

## 2015-12-28 DIAGNOSIS — Z941 Heart transplant status: Secondary | ICD-10-CM

## 2015-12-28 DIAGNOSIS — K219 Gastro-esophageal reflux disease without esophagitis: Secondary | ICD-10-CM

## 2015-12-28 DIAGNOSIS — N400 Enlarged prostate without lower urinary tract symptoms: Secondary | ICD-10-CM

## 2015-12-28 LAB — GLUCOSE, CAPILLARY
GLUCOSE-CAPILLARY: 300 mg/dL — AB (ref 65–99)
GLUCOSE-CAPILLARY: 245 mg/dL — AB (ref 65–99)
Glucose-Capillary: 252 mg/dL — ABNORMAL HIGH (ref 65–99)
Glucose-Capillary: 255 mg/dL — ABNORMAL HIGH (ref 65–99)

## 2015-12-28 LAB — BASIC METABOLIC PANEL
ANION GAP: 14 (ref 5–15)
BUN: 51 mg/dL — ABNORMAL HIGH (ref 6–20)
CALCIUM: 9.2 mg/dL (ref 8.9–10.3)
CO2: 17 mmol/L — AB (ref 22–32)
Chloride: 101 mmol/L (ref 101–111)
Creatinine, Ser: 1.6 mg/dL — ABNORMAL HIGH (ref 0.61–1.24)
GFR calc non Af Amer: 46 mL/min — ABNORMAL LOW (ref >60)
GFR, EST AFRICAN AMERICAN: 53 mL/min — AB (ref >60)
GLUCOSE: 298 mg/dL — AB (ref 65–99)
POTASSIUM: 4 mmol/L (ref 3.5–5.1)
Sodium: 132 mmol/L — ABNORMAL LOW (ref 135–145)

## 2015-12-28 LAB — CBC
HEMATOCRIT: 34.5 % — AB (ref 39.0–52.0)
HEMOGLOBIN: 11.5 g/dL — AB (ref 13.0–17.0)
MCH: 30.4 pg (ref 26.0–34.0)
MCHC: 33.3 g/dL (ref 30.0–36.0)
MCV: 91.3 fL (ref 78.0–100.0)
Platelets: 260 10*3/uL (ref 150–400)
RBC: 3.78 MIL/uL — AB (ref 4.22–5.81)
RDW: 12.8 % (ref 11.5–15.5)
WBC: 6.1 10*3/uL (ref 4.0–10.5)

## 2015-12-28 LAB — LIPASE, BLOOD: LIPASE: 29 U/L (ref 11–51)

## 2015-12-28 MED ORDER — INSULIN ASPART 100 UNIT/ML ~~LOC~~ SOLN
0.0000 [IU] | Freq: Every day | SUBCUTANEOUS | Status: DC
  Administered 2015-12-28: 3 [IU] via SUBCUTANEOUS

## 2015-12-28 MED ORDER — INSULIN ASPART 100 UNIT/ML ~~LOC~~ SOLN
0.0000 [IU] | Freq: Three times a day (TID) | SUBCUTANEOUS | Status: DC
  Administered 2015-12-28 – 2015-12-29 (×2): 7 [IU] via SUBCUTANEOUS
  Administered 2015-12-29: 3 [IU] via SUBCUTANEOUS
  Administered 2015-12-29: 4 [IU] via SUBCUTANEOUS
  Administered 2015-12-30: 3 [IU] via SUBCUTANEOUS
  Administered 2015-12-30 (×2): 4 [IU] via SUBCUTANEOUS

## 2015-12-28 MED ORDER — DILTIAZEM HCL ER BEADS 240 MG PO CP24
360.0000 mg | ORAL_CAPSULE | Freq: Every day | ORAL | Status: DC
  Administered 2015-12-28: 360 mg via ORAL
  Filled 2015-12-28 (×4): qty 1

## 2015-12-28 MED ORDER — AMLODIPINE BESYLATE 5 MG PO TABS
5.0000 mg | ORAL_TABLET | Freq: Every day | ORAL | Status: DC
  Administered 2015-12-28 – 2015-12-30 (×3): 5 mg via ORAL
  Filled 2015-12-28 (×3): qty 1

## 2015-12-28 MED ORDER — INSULIN GLARGINE 100 UNIT/ML ~~LOC~~ SOLN
24.0000 [IU] | Freq: Every day | SUBCUTANEOUS | Status: DC
  Administered 2015-12-28 – 2015-12-29 (×2): 24 [IU] via SUBCUTANEOUS
  Filled 2015-12-28 (×4): qty 0.24

## 2015-12-28 MED ORDER — INSULIN ASPART 100 UNIT/ML ~~LOC~~ SOLN
4.0000 [IU] | Freq: Three times a day (TID) | SUBCUTANEOUS | Status: DC
  Administered 2015-12-28 – 2015-12-30 (×7): 4 [IU] via SUBCUTANEOUS

## 2015-12-28 NOTE — Progress Notes (Signed)
Md called for clarification of ABD [place] Vs CT ABD and Pelvis.  Will obtain ABD [place].  Will continue to monitor. [name] Revel T

## 2015-12-28 NOTE — Progress Notes (Signed)
[place] TEAM 1 - Stepdown/ICU TEAM PROGRESS NOTE  [name] [id number] DOB: August 29, 1956 DOA: 12/26/2015 PCP: Garret Reddish, MD  Admit HPI / Brief Narrative: 60 y.o. male with history of heart transplant, CAD, AICD, HTN, HLD, Hypothyroidism, DM2, and GERD who came to the ED with a six-day history of fatigue, nausea with no vomiting, poor appetite, dyspnea, diarrhea, and productive cough. He was recently seen by his PCP and was given doxycycline to treat bronchitis.  He also complained of RLQ pain for 2 months.  In the ER, workup demonstrated an elevated anion gap, hyperglycemia, hyperkalemia, and AKI.   HPI/Subjective: Pt c/o intermittent suprapubic crampy abdom pain.  This has occurred off and on for many months now.  He denies cp, sob, n/v, or HA.    Assessment/Plan:  DKA - DM2, uncontrolled CBG remains very poorly controlled - pt is threatening return to DKA w/ bicarb dropping and AG climbing - A1c 13.5 12/22/15 - adjust insulin significantly and follow   AKI Hold ACE inhibitor and diuretic for now   Recent Labs Lab 12/26/15 2311 12/27/15 0256 12/27/15 0736 12/27/15 1323 12/27/15 1931 12/28/15 0240  CREATININE 2.90* 2.96* 2.13* 1.51* 1.61* 1.60*     Hyperlipidemia Cont medical tx   Coronary atherosclerosis continue aspirin, atorvastatin and calcium channel blockers - [facility]   Hx of Heart transplant Cont Prograf and CellCept - followed at [facility]   Hypothyroidism Continue levothyroxine   HTN BP climbing - add low dose coreg and follow   BPH   GERD  Continue PPI  Chronic Abdominal Pain CT abdom and pelvis   Obesity - Body mass index is 31.42 kg/(m^2).  Code Status: FULL Family Communication: spoke w/ mother at beside  Disposition Plan: SDU  Consultants: none  Procedures: none  Antibiotics: none  DVT prophylaxis: lovenox   Objective: Blood pressure 142/73, pulse 102, temperature 98.1 F (36.7 C), temperature source Oral,  resp. rate 17, height 5\' 8"  (1.727 m), weight 93.7 kg (206 lb 9.1 oz), SpO2 93 %.  Intake/Output Summary (Last 24 hours) at 12/28/15 1410 Last data filed at 12/28/15 1200  Gross per 24 hour  Intake   1705 ml  Output   1700 ml  Net      5 ml   Exam: General: No acute respiratory distress Lungs: Clear to auscultation bilaterally without wheezes or crackles Cardiovascular: Regular rate and rhythm without murmur gallop or rub normal S1 and S2 Abdomen: Nontender, obese, soft, bowel sounds positive, no rebound, no ascites, no appreciable mass Extremities: No significant cyanosis, clubbing, or edema bilateral lower extremities   Data Reviewed: Basic Metabolic Panel:  Recent Labs Lab 12/27/15 0256 12/27/15 0736 12/27/15 1323 12/27/15 1931 12/28/15 0240  NA 125* 132* 132* 132* 132*  K 5.8* 4.1 3.5 3.7 4.0  CL 88* 100* 103 102 101  CO2 9* 15* 18* 17* 17*  GLUCOSE 692* 333* 146* 213* 298*  BUN 82* 73* 61* 55* 51*  CREATININE 2.96* 2.13* 1.51* 1.61* 1.60*  CALCIUM 9.4 8.8* 8.9 9.0 9.2  PHOS  --  2.5  --   --   --     CBC:  Recent Labs Lab 12/26/15 2311 12/28/15 0240  WBC 11.4* 6.1  NEUTROABS 10.2*  --   HGB 13.5 11.5*  HCT 40.7 34.5*  MCV 92.5 91.3  PLT 381 260    Liver Function Tests:  Recent Labs Lab 12/26/15 2311  AST 7*  ALT 10*  ALKPHOS 103  BILITOT 1.9*  PROT 7.6  ALBUMIN 3.0*   CBG:  Recent Labs Lab 12/27/15 1509 12/27/15 1612 12/27/15 2121 12/28/15 0758 12/28/15 1156  GLUCAP 140* 139* 236* 300* 252*   Studies:   Recent x-ray studies have been reviewed in detail by the Attending Physician  Scheduled Meds:  Scheduled Meds: . aspirin EC  81 mg Oral Daily  . atorvastatin  80 mg Oral Daily  . enoxaparin (LOVENOX) injection  40 mg Subcutaneous Q24H  . insulin aspart  0-15 Units Subcutaneous TID WC  . insulin aspart  0-5 Units Subcutaneous QHS  . insulin glargine  14 Units Subcutaneous QHS  . levothyroxine  75 mcg Oral QAC breakfast  .  mycophenolate  500 mg Oral BID  . sodium chloride flush  3 mL Intravenous Q12H  . tacrolimus  0.5 mg Oral BID  . tamsulosin  0.4 mg Oral Daily    Time spent on care of this patient: 35 mins   Jetaun Colbath T , MD   [facility] Office  [phone] Pager - Text Page per Shea Evans as per below:  On-Call/Text Page:      [email or web address]      password [email or web address]  If 7PM-7AM, please contact night-coverage [email or web address] Password [email or web address] 12/28/2015, 2:10 PM   LOS: 1 day

## 2015-12-29 LAB — CBC
HCT: 35.7 % — ABNORMAL LOW (ref 39.0–52.0)
Hemoglobin: 12 g/dL — ABNORMAL LOW (ref 13.0–17.0)
MCH: 30.4 pg (ref 26.0–34.0)
MCHC: 33.6 g/dL (ref 30.0–36.0)
MCV: 90.4 fL (ref 78.0–100.0)
PLATELETS: 242 10*3/uL (ref 150–400)
RBC: 3.95 MIL/uL — AB (ref 4.22–5.81)
RDW: 12.4 % (ref 11.5–15.5)
WBC: 5.1 10*3/uL (ref 4.0–10.5)

## 2015-12-29 LAB — GLUCOSE, CAPILLARY
GLUCOSE-CAPILLARY: 122 mg/dL — AB (ref 65–99)
GLUCOSE-CAPILLARY: 218 mg/dL — AB (ref 65–99)
GLUCOSE-CAPILLARY: 119 mg/dL — AB (ref 65–99)
Glucose-Capillary: 193 mg/dL — ABNORMAL HIGH (ref 65–99)

## 2015-12-29 LAB — HEPATIC FUNCTION PANEL
ALT: 9 U/L — AB (ref 17–63)
AST: 9 U/L — AB (ref 15–41)
Albumin: 2.6 g/dL — ABNORMAL LOW (ref 3.5–5.0)
Alkaline Phosphatase: 79 U/L (ref 38–126)
TOTAL PROTEIN: 6 g/dL — AB (ref 6.5–8.1)
Total Bilirubin: 0.5 mg/dL (ref 0.3–1.2)

## 2015-12-29 LAB — BASIC METABOLIC PANEL
Anion gap: 11 (ref 5–15)
BUN: 26 mg/dL — ABNORMAL HIGH (ref 6–20)
CHLORIDE: 102 mmol/L (ref 101–111)
CO2: 22 mmol/L (ref 22–32)
CREATININE: 1.05 mg/dL (ref 0.61–1.24)
Calcium: 9.3 mg/dL (ref 8.9–10.3)
GFR calc non Af Amer: >60 mL/min (ref >60)
Glucose, Bld: 202 mg/dL — ABNORMAL HIGH (ref 65–99)
Potassium: 3.1 mmol/L — ABNORMAL LOW (ref 3.5–5.1)
SODIUM: 135 mmol/L (ref 135–145)

## 2015-12-29 MED ORDER — LIVING WELL WITH DIABETES BOOK
Freq: Once | Status: AC
  Administered 2015-12-29: 16:00:00
  Filled 2015-12-29 (×2): qty 1

## 2015-12-29 MED ORDER — PANTOPRAZOLE SODIUM 40 MG PO TBEC
40.0000 mg | DELAYED_RELEASE_TABLET | Freq: Every day | ORAL | Status: DC
  Administered 2015-12-29 – 2015-12-30 (×2): 40 mg via ORAL
  Filled 2015-12-29 (×2): qty 1

## 2015-12-29 MED ORDER — POTASSIUM CHLORIDE CRYS ER 20 MEQ PO TBCR
40.0000 meq | EXTENDED_RELEASE_TABLET | Freq: Once | ORAL | Status: AC
  Administered 2015-12-29: 40 meq via ORAL
  Filled 2015-12-29: qty 2

## 2015-12-29 MED ORDER — DILTIAZEM HCL ER COATED BEADS 180 MG PO CP24
360.0000 mg | ORAL_CAPSULE | Freq: Every day | ORAL | Status: DC
  Administered 2015-12-29 – 2015-12-30 (×2): 360 mg via ORAL
  Filled 2015-12-29: qty 2
  Filled 2015-12-29: qty 1

## 2015-12-29 MED ORDER — BETHANECHOL CHLORIDE 10 MG PO TABS
10.0000 mg | ORAL_TABLET | Freq: Three times a day (TID) | ORAL | Status: DC
  Administered 2015-12-29 – 2015-12-30 (×4): 10 mg via ORAL
  Filled 2015-12-29 (×7): qty 1

## 2015-12-29 MED ORDER — POTASSIUM CHLORIDE 10 MEQ/100ML IV SOLN
10.0000 meq | INTRAVENOUS | Status: AC
Start: 2015-12-29 — End: 2015-12-29
  Administered 2015-12-29 (×2): 10 meq via INTRAVENOUS
  Filled 2015-12-29 (×2): qty 100

## 2015-12-29 MED ORDER — INSULIN STARTER KIT- PEN NEEDLES (ENGLISH)
1.0000 | Freq: Once | Status: AC
  Administered 2015-12-30: 1
  Filled 2015-12-29 (×2): qty 1

## 2015-12-29 NOTE — Progress Notes (Signed)
[name]  [name] [id number] DOB: 18-Jan-1956 DOA: 12/26/2015 PCP: Garret Reddish, MD  Admit HPI / Brief Narrative: 60 y.o. male with history of heart transplant, CAD, AICD, HTN, HLD, Hypothyroidism, DM2, and GERD who came to the ED with a six-day history of fatigue, nausea with no vomiting, poor appetite, dyspnea, diarrhea, and productive cough. He was recently seen by his PCP and was given doxycycline to treat bronchitis.  He also complained of RLQ pain for 2 months.  In the ER, workup demonstrated an elevated anion gap, hyperglycemia, hyperkalemia, and AKI.   HPI/Subjective: The pt continues to report crampy suprapubic discomfort intermittently.  This appears to worsen just before he urinates, and improves after he does urinate.  He denies any urinary hesitancy or difficulty w/ maintaining stream.  He denies dysuria.  An abdom US was unrevealing.  No cp, n/v, or sob.    Assessment/Plan:  DKA - DM2, uncontrolled CBG now better controlled - bicarb back to normal and anion gap normal - continue to educate on use of insulin and CBG checks   AKI Hold ACE inhibitor and diuretic for now - steadily improving   Recent Labs Lab 12/27/15 0256 12/27/15 0736 12/27/15 1323 12/27/15 1931 12/28/15 0240 12/29/15 0257  CREATININE 2.96* 2.13* 1.51* 1.61* 1.60* 1.05     Hyperlipidemia Cont medical tx   Hypokalemia Replace and f/u in AM  Coronary atherosclerosis continue aspirin, atorvastatin and calcium channel blockers - followed at [facility]   Hx of Heart transplant Cont Prograf and CellCept - followed at [facility]   Hypothyroidism Continue levothyroxine   HTN BP reasonably controlled at this time   BPH   GERD  Continue PPI  Chronic Suprapubic Pain Abdom US w/o acute findings - ?bladder spasms - give trial of urecholine - may need to f/u w/ Dr. Gaynelle Arabian as outpt   Obesity - Body mass index is 31.42 kg/(m^2).  Code Status:  FULL Family Communication: spoke w/ mother at beside  Disposition Plan: transfer to medical bed - PT/OT - follow CBG - educated on CBG checks and insulin dosing - possible d/c in ~48hrs   Consultants: none  Procedures: none  Antibiotics: none  DVT prophylaxis: lovenox   Objective: Blood pressure 142/61, pulse 89, temperature 98.3 F (36.8 C), temperature source Oral, resp. rate 18, height 5\' 8"  (1.727 m), weight 93.7 kg (206 lb 9.1 oz), SpO2 97 %.  Intake/Output Summary (Last 24 hours) at 12/29/15 1336 Last data filed at 12/29/15 1000  Gross per 24 hour  Intake 1680.84 ml  Output   1775 ml  Net -94.16 ml   Exam: General: No acute respiratory distress - alert  Lungs: Clear to auscultation bilaterally  Cardiovascular: Regular rate and rhythm without murmur gallop or rub normal S1 and S2 Abdomen: Nontender, obese, soft, bowel sounds positive, no rebound, no appreciable mass Extremities: No significant cyanosis, clubbing, edema bilateral lower extremities   Data Reviewed: Basic Metabolic Panel:  Recent Labs Lab 12/27/15 0736 12/27/15 1323 12/27/15 1931 12/28/15 0240 12/29/15 0257  NA 132* 132* 132* 132* 135  K 4.1 3.5 3.7 4.0 3.1*  CL 100* 103 102 101 102  CO2 15* 18* 17* 17* 22  GLUCOSE 333* 146* 213* 298* 202*  BUN 73* 61* 55* 51* 26*  CREATININE 2.13* 1.51* 1.61* 1.60* 1.05  CALCIUM 8.8* 8.9 9.0 9.2 9.3  PHOS 2.5  --   --   --   --     CBC:  Recent Labs Lab 12/26/15 2311 12/28/15 0240 12/29/15 0257  WBC 11.4* 6.1 5.1  NEUTROABS 10.2*  --   --   HGB 13.5 11.5* 12.0*  HCT 40.7 34.5* 35.7*  MCV 92.5 91.3 90.4  PLT 381 260 242    Liver Function Tests:  Recent Labs Lab 12/26/15 2311 12/29/15 0257  AST 7* 9*  ALT 10* 9*  ALKPHOS 103 79  BILITOT 1.9* 0.5  PROT 7.6 6.0*  ALBUMIN 3.0* 2.6*   CBG:  Recent Labs Lab 12/28/15 1156 12/28/15 1643 12/28/15 2340 12/29/15 0809 12/29/15 1139  GLUCAP 252* 245* 255* 193* 122*   Studies:    Recent x-ray studies have been reviewed in detail by the Attending Physician  Scheduled Meds:  Scheduled Meds: . amLODipine  5 mg Oral Daily  . aspirin EC  81 mg Oral Daily  . atorvastatin  80 mg Oral Daily  . diltiazem  360 mg Oral Daily  . enoxaparin (LOVENOX) injection  40 mg Subcutaneous Q24H  . insulin aspart  0-20 Units Subcutaneous TID WC  . insulin aspart  0-5 Units Subcutaneous QHS  . insulin aspart  4 Units Subcutaneous TID WC  . insulin glargine  24 Units Subcutaneous QHS  . levothyroxine  75 mcg Oral QAC breakfast  . mycophenolate  500 mg Oral BID  . sodium chloride flush  3 mL Intravenous Q12H  . tacrolimus  0.5 mg Oral BID  . tamsulosin  0.4 mg Oral Daily    Time spent on care of this patient: 35 mins   MCCLUNG,JEFFREY T , MD   [facility] Office  [phone] Pager - Text Page per Shea Evans as per below:  On-Call/Text Page:      [email or web address]      password [email or web address]  If 7PM-7AM, please contact night-coverage [email or web address] Password [email or web address] 12/29/2015, 1:36 PM   LOS: 2 days

## 2015-12-29 NOTE — Evaluation (Signed)
Physical Therapy Evaluation Patient Details Name: [name] MRN: [id number] DOB: [birth year] Today's Date: 12/29/2015   History of Present Illness  60 y.o. male with history of heart transplant, CAD, AICD, HTN, HLD, Hypothyroidism, DM2, and GERD who came to the ED with a six-day history of fatigue, nausea with no vomiting, poor appetite, dyspnea, diarrhea, and productive cough. He was recently seen by his PCP and was given doxycycline to treat bronchitis. He also complained of RLQ pain for 2 months.  Clinical Impression  Pt admitted with above diagnosis. Pt currently with functional limitations due to the deficits listed below (see PT Problem List). Pt was able to ambulate with min guard assist.  Should be able to d/c home once stable but may need a RW.  HHPT recommended as well.  Will follow acutely.  Pt will benefit from skilled PT to increase their independence and safety with mobility to allow discharge to the venue listed below.    Follow Up Recommendations Home health PT;Supervision - Intermittent    Equipment Recommendations  Rolling walker with 5" wheels    Recommendations for Other Services       Precautions / Restrictions Precautions Precautions: Fall Restrictions Weight Bearing Restrictions: No      Mobility  Bed Mobility Overal bed mobility: Independent                Transfers Overall transfer level: Independent                  Ambulation/Gait Ambulation/Gait assistance: Min guard Ambulation Distance (Feet): 280 Feet Assistive device: 1 person hand held assist Gait Pattern/deviations: Step-through pattern;Decreased stride length;Shuffle   Gait velocity interpretation: Below normal speed for age/gender General Gait Details: Pt held onto IV pole.  Pt not putting weight on IV pole just pushing it.  Pt did drag his feet and stated he did that because he doesn't normally wear the bedroom shoes that he is wearing today.  No LOB but no challenges  given to balance either.    Stairs            Wheelchair Mobility    Modified Rankin (Stroke Patients Only)       Balance Overall balance assessment: Needs assistance;History of Falls Sitting-balance support: No upper extremity supported;Feet supported Sitting balance-Leahy Scale: Good     Standing balance support: During functional activity;Single extremity supported Standing balance-Leahy Scale: Fair Standing balance comment: can stand statically without UE support.                              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  Mom would hold onto pt as she needed steadying with ambulation per pt and mom.  Mom could bathe and dress herself however.  Pt does cooking and home management.      Hand Dominance        Extremity/Trunk Assessment   Upper Extremity Assessment: Defer to OT evaluation  Lower Extremity Assessment: Generalized weakness      Cervical / Trunk Assessment: Normal  Communication   Communication: No difficulties  Cognition Arousal/Alertness: Awake/alert Behavior During Therapy: WFL for tasks assessed/performed Overall Cognitive Status: Within Functional Limits for tasks assessed                      General Comments      Exercises General Exercises - Lower Extremity Ankle Circles/Pumps: AROM;Both;10 reps;Seated Long Arc Quad: AROM;Both;10 reps;Seated       Assessment/Plan    PT Assessment Patient needs continued PT services  PT Diagnosis Generalized weakness   PT Problem List Decreased activity tolerance;Decreased balance;Decreased mobility;Decreased knowledge of use of DME;Decreased safety awareness;Decreased knowledge of precautions  PT Treatment Interventions DME instruction;Gait training;Functional mobility training;Therapeutic activities;Stair training;Cognitive remediation;Balance training;Therapeutic exercise;Patient/family education   PT Goals (Current goals can be found in the Care Plan section) Acute Rehab PT Goals Patient Stated Goal: to get better PT Goal Formulation: With patient Time For Goal Achievement: 01/12/16 Potential to Achieve Goals: Good    Frequency Min 3X/week   Barriers to discharge Decreased caregiver support (pt is caregiver for his mother)      Co-evaluation               End of Session Equipment Utilized During Treatment: Gait belt Activity Tolerance: Patient tolerated treatment well Patient left: in bed;with call bell/phone within reach;with family/visitor present Nurse Communication: Mobility status         Time: 1439-1455 PT Time Calculation (min) (ACUTE ONLY): 16 min   Charges:   PT Evaluation $PT Eval Moderate Complexity: 1 Procedure     PT G CodesIrwin Brakeman F 01-21-2016, 4:03 PM Jamyrah Saur [facility] Acute Rehabilitation [phone] (pager)

## 2015-12-29 NOTE — Progress Notes (Signed)
Report called to nurse Mendel Ryder (LB). Belongings sent with patient. Patient's mom is a bedside and will come up with him.

## 2015-12-29 NOTE — Progress Notes (Addendum)
Inpatient Diabetes Program Recommendations  AACE/ADA: New Consensus Statement on Inpatient Glycemic Control (2015)  Target Ranges:  Prepandial:   less than 140 mg/dL      Peak postprandial:   less than 180 mg/dL (1-2 hours)      Critically ill patients:  140 - 180 mg/dL   Review of Glycemic Control Results for [name], [name] (MRN [id number]) as of 12/29/2015 09:22  Ref. Range 12/27/2015 14:06 12/27/2015 15:09 12/27/2015 16:12 12/27/2015 21:21 12/28/2015 07:58 12/28/2015 11:56 12/28/2015 16:43 12/28/2015 23:40 12/29/2015 08:09  Glucose-Capillary Latest Ref Range: 65-99 mg/dL 140 (H) 140 (H) 139 (H) 236 (H) 300 (H) 252 (H) 245 (H) 255 (H) 193 (H)   Diabetes history: DM Type 2 Outpatient Diabetes medications: Metformin 500 mg bid Current orders for Inpatient glycemic control: Lantus 24 units q hs + Novolog 4 units tid + Novolog moderate 0-15 units correction scale   Inpatient Diabetes Program Recommendations:  Noted A1C 13.5. Will follow. Spoke with patient this afternoon.  Patient had log of CBGs on His phone that showed home CBGS running >400 after Metformin started. Spoke with pt about elevated A1c. Discussed A1C results with them and explained what an A1C is, basic pathophysiology of DM Type 2, basic home care, basic diabetes diet nutrition principles, importance of checking CBGs and maintaining good CBG control to prevent long-term and short-term complications. Reviewed signs and symptoms of hyperglycemia and hypoglycemia and how to treat hypoglycemia at home. Also reviewed blood sugar goals at home.  RNs to provide ongoing basic DM education at bedside with this patient. Have ordered educational booklet, insulin starter kit, and DM videos. Nurse to start insulin administration teaching to prepare patient for discharge home if goes home on insulin.  Thank you, Nani Gasser. Latese Dufault, RN, MSN, CDE Inpatient Glycemic Control Team Team Pager [phone] (8am-5pm) 12/29/2015 9:29 AM

## 2015-12-29 NOTE — Progress Notes (Signed)
Md notified about pt's potassium of 3.1.  Will continue to monitor [name] Revel T

## 2015-12-30 DIAGNOSIS — E1310 Other specified diabetes mellitus with ketoacidosis without coma: Secondary | ICD-10-CM

## 2015-12-30 DIAGNOSIS — I10 Essential (primary) hypertension: Secondary | ICD-10-CM

## 2015-12-30 DIAGNOSIS — R109 Unspecified abdominal pain: Secondary | ICD-10-CM

## 2015-12-30 DIAGNOSIS — N179 Acute kidney failure, unspecified: Principal | ICD-10-CM

## 2015-12-30 DIAGNOSIS — G8929 Other chronic pain: Secondary | ICD-10-CM

## 2015-12-30 DIAGNOSIS — E1165 Type 2 diabetes mellitus with hyperglycemia: Secondary | ICD-10-CM

## 2015-12-30 LAB — BASIC METABOLIC PANEL
ANION GAP: 13 (ref 5–15)
BUN: 16 mg/dL (ref 6–20)
CALCIUM: 9.2 mg/dL (ref 8.9–10.3)
CO2: 22 mmol/L (ref 22–32)
Chloride: 99 mmol/L — ABNORMAL LOW (ref 101–111)
Creatinine, Ser: 1.05 mg/dL (ref 0.61–1.24)
Glucose, Bld: 166 mg/dL — ABNORMAL HIGH (ref 65–99)
Potassium: 3.3 mmol/L — ABNORMAL LOW (ref 3.5–5.1)
SODIUM: 134 mmol/L — AB (ref 135–145)

## 2015-12-30 LAB — GLUCOSE, CAPILLARY
GLUCOSE-CAPILLARY: 141 mg/dL — AB (ref 65–99)
GLUCOSE-CAPILLARY: 176 mg/dL — AB (ref 65–99)
Glucose-Capillary: 169 mg/dL — ABNORMAL HIGH (ref 65–99)

## 2015-12-30 MED ORDER — INSULIN GLARGINE 100 UNIT/ML ~~LOC~~ SOLN: 24.0000 [IU] | Freq: Every day | SUBCUTANEOUS | Status: DC

## 2015-12-30 MED ORDER — INSULIN ASPART 100 UNIT/ML ~~LOC~~ SOLN: 4.0000 [IU] | Freq: Three times a day (TID) | SUBCUTANEOUS | Status: DC

## 2015-12-30 MED ORDER — UNABLE TO FIND: Status: DC

## 2015-12-30 MED ORDER — BETHANECHOL CHLORIDE 10 MG PO TABS: 10.0000 mg | ORAL_TABLET | Freq: Three times a day (TID) | ORAL | Status: DC

## 2015-12-30 MED ORDER — POTASSIUM CHLORIDE CRYS ER 20 MEQ PO TBCR
40.0000 meq | EXTENDED_RELEASE_TABLET | Freq: Once | ORAL | Status: AC
  Administered 2015-12-30: 40 meq via ORAL
  Filled 2015-12-30: qty 2

## 2015-12-30 MED ORDER — INSULIN STARTER KIT- PEN NEEDLES (ENGLISH): 1.0000 | Freq: Once | Status: DC

## 2015-12-30 NOTE — Care Management (Signed)
CM received call from [facility] on 6N, concerning [place] orders. HHRN has been added for medication management, patient is a newly diagnosed insulin dependent diabetic. New referral faxed to [facility].

## 2015-12-30 NOTE — Evaluation (Signed)
Occupational Therapy Evaluation and Discharge Summary Patient Details Name: [name] MRN: [id number] DOB: 1956-08-16 Today's Date: 12/30/2015    History of Present Illness 60 y.o. male with history of heart transplant, CAD, AICD, HTN, HLD, Hypothyroidism, DM2, and GERD who came to the ED with a six-day history of fatigue, nausea with no vomiting, poor appetite, dyspnea, diarrhea, and productive cough. He was recently seen by his PCP and was given doxycycline to treat bronchitis. He also complained of RLQ pain for 2 months.   Clinical Impression   Pt seen for above diagnosis and overall is doing well with basic adls. Pt can bathe, dress, toilet, groom and move around room without assist.  Pt does have some mild balance deficits noted by PT and a history of falls.  PT to follow for balance.  Pt not interested in having follow up OT and probably is fine without it.  Reviewed safety concerns for home re: cat and ways to avoid falls.  Will d/c pt.    Follow Up Recommendations  No OT follow up;Supervision/Assistance - 24 hour    Equipment Recommendations  None recommended by OT    Recommendations for Other Services       Precautions / Restrictions Precautions Precautions: Fall Restrictions Weight Bearing Restrictions: No      Mobility Bed Mobility Overal bed mobility: Independent                Transfers Overall transfer level: Independent Equipment used: None                  Balance Overall balance assessment: History of Falls Sitting-balance support: Feet supported Sitting balance-Leahy Scale: Good     Standing balance support: During functional activity Standing balance-Leahy Scale: Fair Standing balance comment: pt did not lose balance during evaluation                            ADL Overall ADL's : At baseline                                       General ADL Comments: Pt states he is at his baseline with adl. Pt  able to ambulate in room, use bathroom, bathe and dress with no hands on assist.  Pt tends to shuffle his feet when he walks but did not have and LOB during eval.  PT is seeing for balance.     Vision Vision Assessment?: No apparent visual deficits   Perception     Praxis      Pertinent Vitals/Pain Pain Assessment: No/denies pain     Hand Dominance Right   Extremity/Trunk Assessment Upper Extremity Assessment Upper Extremity Assessment: Overall WFL for tasks assessed   Lower Extremity Assessment Lower Extremity Assessment: Defer to PT evaluation   Cervical / Trunk Assessment Cervical / Trunk Assessment: Normal   Communication Communication Communication: No difficulties   Cognition Arousal/Alertness: Awake/alert Behavior During Therapy: WFL for tasks assessed/performed Overall Cognitive Status: Within Functional Limits for tasks assessed                     General Comments       Exercises       Shoulder Instructions      Home Living Family/patient expects to be discharged to:: Private residence Living Arrangements: Parent Available Help at Discharge: Available PRN/intermittently;Family Type  of Home: House Home Access: Stairs to enter [employer] of Steps: 2 Entrance Stairs-Rails: None Home Layout: Multi-level Alternate Level Stairs-Number of Steps: 7 Alternate Level Stairs-Rails: Right Bathroom Shower/Tub: Tub/shower unit;Tub only Shower/tub characteristics: Door Bathroom Toilet: Handicapped height Bathroom Accessibility: Yes   Home Equipment: Cane - single point;Bedside commode          Prior Functioning/Environment Level of Independence: Independent        Comments: Did not use device PTA.  Mom would hold onto pt as she needed steadying with ambulation per pt and mom.  Mom could bathe and dress herself however.  Pt does cooking and home management.     OT Diagnosis:     OT Problem List:     OT Treatment/Interventions:       OT Goals(Current goals can be found in the care plan section) Acute Rehab OT Goals Patient Stated Goal: to get better  OT Frequency:     Barriers to D/C:            Co-evaluation              End of Session Nurse Communication: Mobility status  Activity Tolerance: Patient tolerated treatment well Patient left: in chair;with call bell/phone within reach   Time: 0845-0900 OT Time Calculation (min): 15 min Charges:  OT General Charges $OT Visit: 1 Procedure OT Evaluation $OT Eval Low Complexity: 1 Procedure G-Codes:    Glenford Peers 2016-01-11, 10:47 AM  [phone]

## 2015-12-30 NOTE — Discharge Planning (Signed)
Pt has been shown how and asked to watch videos 501-511 numerous times. Pt has watched 501-503. Educated pt on insulin administration and watched him return demonstrate injection. Discussed when to check blood sugar and how to administer insulin based on a sliding scale. Pt verbalizes understanding. I have asked pt to at least watch videos 506 & 511 on insulin administration before discharge.

## 2015-12-30 NOTE — Care Management Important Message (Signed)
Important Message  Patient Details  Name: [name] MRN: [id number] Date of Birth: May 11, 1956   Medicare Important Message Given:  Yes    Adalin Vanderploeg P [place] 12/30/2015, 1:39 PM

## 2015-12-30 NOTE — Progress Notes (Addendum)
PT note Called by nursing to address pt home needs.  This PT evaluated pt yesterday, 12/29/15.  Pt with mild balance deficits noted.  OT noted same balance issues today with high level activities.  Pt reported to OT and this PT that he falls at times at home in uncontrolled environment.  Mother also holds onto pt when she walks.  Feel that pt needs RW as well as mother could use a RW to decr their risk of falls in the home for ultimate safety.  HHPT is recommended for safety eval and to progress pt back to prior functional level.  Pt is agreeable.   Thanks.   [place] [phone] (pager)

## 2015-12-30 NOTE — Plan of Care (Signed)
Problem: Food- and Nutrition-Related Knowledge Deficit (NB-1.1) Goal: Nutrition education Formal process to instruct or train a patient/client in a skill or to impart knowledge to help patients/clients voluntarily manage or modify food choices and eating behavior to maintain or improve health. Outcome: Adequate for Discharge  RD consulted for nutrition education regarding diabetes.     Lab Results  Component Value Date    HGBA1C 13.5 12/22/2015    RD provided "Carbohydrate Counting for People with Diabetes" handout from the Academy of Nutrition and Dietetics. Discussed different food groups and their effects on blood sugar, emphasizing carbohydrate-containing foods. Provided list of carbohydrates and recommended serving sizes of common foods.  Discussed importance of controlled and consistent carbohydrate intake throughout the day. Provided examples of ways to balance meals/snacks and encouraged intake of high-fiber, whole grain complex carbohydrates. Teach back method used.  Expect good compliance.  Body mass index is 31.88 kg/(m^2). Pt meets criteria for obesity, class I based on current BMI.  Current diet order is Carb Modified, patient is consuming approximately 100% of meals at this time. Labs and medications reviewed. No further nutrition interventions warranted at this time. RD contact information provided. If additional nutrition issues arise, please re-consult RD.  Jaye Saal A. Jimmye Norman, RD, LDN, CDE Pager: [phone] After hours Pager: [phone]

## 2015-12-30 NOTE — Care Management Note (Addendum)
Case Management Note  Patient Details  Name: DARRIOUS MACRI MRN: [id number] Date of Birth: 1956/05/22  Subjective/Objective:                    Action/Plan: Confirmed face sheet information.  Expected Discharge Date:                  Expected Discharge Plan:  [place]  In-House Referral:     Discharge planning Services  CM Consult  Post Acute Care Choice:  Durable Medical Equipment, Home Health Choice offered to:  Patient  DME Arranged:  Walker rolling DME Agency:  [place]:  PT [facility] Agency:  [place]  Status of Service:  Completed, signed off  Medicare Important Message Given:  Yes Date Medicare IM Given:    Medicare IM give by:    Date Additional Medicare IM Given:    Additional Medicare Important Message give by:     If discussed at [place] of Stay Meetings, dates discussed:    Additional Comments:  Marilu Favre, RN 12/30/2015, 3:57 PM

## 2015-12-30 NOTE — Discharge Summary (Signed)
Physician Discharge Summary  [name] [id number] DOB: Sep 15, 1955 DOA: 12/26/2015  PCP: Garret Reddish, MD  Admit date: 12/26/2015 Discharge date: 12/30/2015  Time spent: 25* minutes  Recommendations for Outpatient Follow-up:  1. Follow-up PCP in 2 weeks   Discharge Diagnoses:  Principal Problem:   DKA (diabetic ketoacidoses) ([place]) Active Problems:   Diabetes mellitus type II, uncontrolled ([place])   Hyperlipidemia   Coronary atherosclerosis   Hypothyroidism   Essential hypertension   BPH (benign prostatic hypertrophy)   Heart transplanted ([facility])   GERD (gastroesophageal reflux disease)   AKI (acute kidney injury) ([place])   Chronic abdominal pain   Discharge Condition: Stable  Diet recommendation: Carb modified diet  Filed Weights   12/27/15 0338 12/30/15 0700 12/30/15 0817  Weight: 93.7 kg (206 lb 9.1 oz) 97.07 kg (214 lb) 95.074 kg (209 lb 9.6 oz)    History of present illness:  60 y.o. male with history of heart transplant, CAD, AICD, HTN, HLD, Hypothyroidism, DM2, and GERD who came to the ED with a six-day history of fatigue, nausea with no vomiting, poor appetite, dyspnea, diarrhea, and productive cough. He was recently seen by his PCP and was given doxycycline to treat bronchitis. He also complained of RLQ pain for 2 months.  Hospital Course:  Diabetes mellitus- patient presented with DKA which is resolved. Patient will now be discharged on Lantus as well as NovoLog 4 units subcutaneous 3 times a day with meals.  Acute kidney injury- resolved, patient came with acute kidney injury which is now resolved. His creatinine is back to 1.05. Will restart diuretics and Lasix as patient has history of congestive heart failure  History of heart transplant- continue Prograf, CellCept. Follow up at [facility] as outpatient  Hypothyroidism- continue Synthroid  Hypertension- blood pressure well controlled. Continue amlodipine, Cardizem.  Chronic suprapubic pain- patient  complained of ductal spasms, was given Tylenol of Urecholine. For discharge on Urecholine at this time follow-up urology as outpatient.  Patient to go home but home physical therapy. And also order rolling walker as outpatient.  Procedures:  None  Consultations: None  Discharge Exam: Filed Vitals:   12/30/15 0531 12/30/15 1341  BP: 129/72 134/64  Pulse: 73 82  Temp: 98.9 F (37.2 C) 98.9 F (37.2 C)  Resp: 18 18    General: Appears in no acute distress Cardiovascular: S1-S2 regular Respiratory: Clear to auscultation bilaterally  Discharge Instructions   Discharge Instructions    Ambulatory referral to Nutrition and Diabetic Education    Complete by:  As directed   A1c 13.5. Patient had education many years ago. Needs assistance with nutrition management.     Diet - low sodium heart healthy    Complete by:  As directed      Increase activity slowly    Complete by:  As directed           Current Discharge Medication List    START taking these medications   Details  bethanechol (URECHOLINE) 10 MG tablet Take 1 tablet (10 mg total) by mouth 3 (three) times daily. Qty: 30 tablet, Refills: 0    insulin aspart (NOVOLOG) 100 UNIT/ML injection Inject 4 Units into the skin 3 (three) times daily with meals. Qty: 10 mL, Refills: 11    insulin glargine (LANTUS) 100 UNIT/ML injection Inject 0.24 mLs (24 Units total) into the skin at bedtime. Qty: 10 mL, Refills: 11    insulin starter kit- pen needles MISC 1 kit by Other route once. Qty: 1 kit, Refills:  0    UNABLE TO FIND Outpatient OT Qty: 1 each, Refills: 0      CONTINUE these medications which have NOT CHANGED   Details  amLODipine (NORVASC) 5 MG tablet Take 5 mg by mouth daily.    aspirin EC 81 MG tablet Take 81 mg by mouth daily.    atorvastatin (LIPITOR) 80 MG tablet Take 80 mg by mouth daily.     chlorthalidone (HYGROTON) 25 MG tablet Take 25 mg by mouth daily.    cholecalciferol (VITAMIN D) 1000 UNITS  tablet Take 3,000 Units by mouth daily.    diltiazem (TIAZAC) 180 MG 24 hr capsule Take 360 mg by mouth daily.    furosemide (LASIX) 20 MG tablet Take 20 mg by mouth.     levothyroxine (SYNTHROID, LEVOTHROID) 75 MCG tablet Take 75 mcg by mouth daily before breakfast.     lisinopril (PRINIVIL,ZESTRIL) 40 MG tablet Take 40 mg by mouth daily.    magnesium oxide (MAG-OX) 400 MG tablet Take 500 mg by mouth 2 (two) times daily.    mycophenolate (CELLCEPT) 500 MG tablet Take 500 mg by mouth 2 (two) times daily.    omeprazole (PRILOSEC) 20 MG capsule Take 20 mg by mouth daily.     tacrolimus (PROGRAF) 0.5 MG capsule Take 0.5 mg by mouth 2 (two) times daily.    tamsulosin (FLOMAX) 0.4 MG CAPS Take 0.4 mg by mouth daily.    glucose blood (ONE TOUCH TEST STRIPS) test strip Use to test blood sugars daily. Dx: e11.9 Qty: 100 each, Refills: 12    metFORMIN (GLUCOPHAGE) 1000 MG tablet Take 1 tablet (1,000 mg total) by mouth 2 (two) times daily with a meal. Qty: 180 tablet, Refills: 3   Associated Diagnoses: Hemoglobin A1c above reference range      STOP taking these medications     doxycycline (VIBRAMYCIN) 100 MG capsule        Not on File Follow-up Information    Follow up with Garret Reddish, MD In 2 weeks.   Specialty:  Family Medicine   Contact information:   [address] [place] [address] [phone]        The results of significant diagnostics from this hospitalization (including imaging, microbiology, ancillary and laboratory) are listed below for reference.    Significant Diagnostic Studies: Dg Chest 2 View  12/23/2015  CLINICAL DATA:  Cough, congestion for 3 days, former smoking history EXAM: CHEST  2 VIEW COMPARISON:  Chest x-ray of 04/20/2009 and 01/14/2009 FINDINGS: No active infiltrate or effusion is seen. The AICD and pacer pack has been removed. However the distal portion of the AICD lead appears disconnected overlying the region of the left  innominate vein consistent with a detached lead. There are somewhat prominent perihilar markings with peribronchial cuffing suggesting bronchitis. The heart is within upper limits normal and stable. Median sternotomy sutures are noted from prior CABG. Old healed left rib fractures are present. IMPRESSION: 1. Probable bronchitis.  No pneumonia or effusion. 2. Detached lead of previous AICD overlies the region of the left innominate vein. Electronically Signed   By: Ivar Drape M.D.   On: 12/23/2015 15:19   US Abdomen Complete  12/28/2015  CLINICAL DATA:  Chronic generalized abdominal pain. Initial encounter. EXAM: ABDOMEN ULTRASOUND COMPLETE COMPARISON:  Abdominal ultrasound performed 11/09/2007 FINDINGS: Gallbladder: Multiple stones are noted within the gallbladder, measuring up to 8 mm in size. There also appear to be small polyps, measuring up to 4 mm in size. No significant  gallbladder wall thickening or pericholecystic fluid is seen. No ultrasonographic Murphy's sign is elicited. Common bile duct: Diameter: 0.4 cm, within normal limits in caliber. Liver: No focal lesion identified. Within normal limits in parenchymal echogenicity. IVC: No abnormality visualized. Pancreas: Visualized portion unremarkable. Spleen: Size and appearance within normal limits. Right Kidney: Length: 13.2 cm. Echogenicity within normal limits. A 1.2 cm cyst is noted at the upper pole of the right kidney. No hydronephrosis visualized. Left Kidney: Length: 12.8 cm. Echogenicity within normal limits. No mass or hydronephrosis visualized. Abdominal aorta: No aneurysm visualized. Other findings: None. IMPRESSION: 1. No acute abnormality seen within the abdomen. 2. Cholelithiasis. Small polyps suggested within the gallbladder. No evidence for obstruction or cholecystitis. 3. Small right renal cyst noted. Electronically Signed   By: Garald Balding M.D.   On: 12/28/2015 21:41    Microbiology: Recent Results (from the past 240 hour(s))   MRSA PCR Screening     Status: None   Collection Time: 12/27/15  3:47 AM  Result Value Ref Range Status   MRSA by PCR NEGATIVE NEGATIVE Final    Comment:        The GeneXpert MRSA Assay (FDA approved for NASAL specimens only), is one component of a comprehensive MRSA colonization surveillance program. It is not intended to diagnose MRSA infection nor to guide or monitor treatment for MRSA infections.      Labs: Basic Metabolic Panel:  Recent Labs Lab 12/27/15 0736 12/27/15 1323 12/27/15 1931 12/28/15 0240 12/29/15 0257 12/30/15 0557  NA 132* 132* 132* 132* 135 134*  K 4.1 3.5 3.7 4.0 3.1* 3.3*  CL 100* 103 102 101 102 99*  CO2 15* 18* 17* 17* 22 22  GLUCOSE 333* 146* 213* 298* 202* 166*  BUN 73* 61* 55* 51* 26* 16  CREATININE 2.13* 1.51* 1.61* 1.60* 1.05 1.05  CALCIUM 8.8* 8.9 9.0 9.2 9.3 9.2  PHOS 2.5  --   --   --   --   --    Liver Function Tests:  Recent Labs Lab 12/26/15 2311 12/29/15 0257  AST 7* 9*  ALT 10* 9*  ALKPHOS 103 79  BILITOT 1.9* 0.5  PROT 7.6 6.0*  ALBUMIN 3.0* 2.6*    Recent Labs Lab 12/28/15 0240  LIPASE 29   No results for input(s): AMMONIA in the last 168 hours. CBC:  Recent Labs Lab 12/26/15 2311 12/28/15 0240 12/29/15 0257  WBC 11.4* 6.1 5.1  NEUTROABS 10.2*  --   --   HGB 13.5 11.5* 12.0*  HCT 40.7 34.5* 35.7*  MCV 92.5 91.3 90.4  PLT 381 260 242    CBG:  Recent Labs Lab 12/29/15 1139 12/29/15 1649 12/29/15 2208 12/30/15 0718 12/30/15 1128  GLUCAP 122* 218* 119* 169* 141*       Signed:  Eleonore Chiquito S MD.  [facility] 12/30/2015, 3:48 PM

## 2015-12-30 NOTE — Progress Notes (Signed)
Inpatient Diabetes Program Recommendations  AACE/ADA: New Consensus Statement on Inpatient Glycemic Control (2015)  Target Ranges:  Prepandial:   less than 140 mg/dL      Peak postprandial:   less than 180 mg/dL (1-2 hours)      Critically ill patients:  140 - 180 mg/dL   Followed up with patient today and gave him educational materials on diet, A1c level, hypoglycemia, hyperglycemia, and knowing your numbers. Answered patient's questions on diet. Will follow.  Thanks,  Tama Headings RN, MSN, [facility] Inpatient Diabetes Coordinator Team Pager [phone] (8a-5p)

## 2015-12-31 ENCOUNTER — Ambulatory Visit: Payer: Commercial Managed Care - HMO | Admitting: Family Medicine

## 2015-12-31 ENCOUNTER — Telehealth: Payer: Self-pay | Admitting: *Deleted

## 2015-12-31 NOTE — Telephone Encounter (Signed)
Transition Care Management Follow-up Telephone Call  How have you been since you were released from the hospital? Pretty good   Do you understand why you were in the hospital? yes   Do you understand the discharge instrcutions? yes  Items Reviewed:  Medications reviewed: yes  Allergies reviewed: yes  Dietary changes reviewed: yes  Referrals reviewed: yes   Functional Questionnaire:   Activities of Daily Living (ADLs):   He states they are independent in the following: ambulation, bathing and hygiene, feeding, continence, grooming, toileting and dressing States they require assistance with the following: none   Any transportation issues/concerns?: no   Any patient concerns? no   Confirmed importance and date/time of follow-up visits scheduled: yes   Confirmed with patient if condition begins to worsen call PCP or go to the ER.  Patient was given the Call-a-Nurse line [phone]: yes Patient was discharged 12/30/15 Patient was discharged to his home Patient has an appointment with Dr Yong Channel 01/07/16

## 2015-12-31 NOTE — Progress Notes (Signed)
  [facility]  Patient Status: New  [facility] is providing the following services: PT   Pt called 12/31/15 AM to add Nursing to Services and pt refused.  Stated he can handle Glucose machine and Insulin administration.  Will ask PT to continue to check with patient about Nursing services.    If patient discharges after hours, please call [phone].   [name] [name] 12/31/2015, 9:50 AM

## 2016-01-06 ENCOUNTER — Other Ambulatory Visit: Payer: Self-pay

## 2016-01-06 NOTE — Patient Outreach (Signed)
[place] [facility]) Care Management  01/06/2016  [name] [birth year] [id number]  Telephone call to patient regarding [employer] direct referral. Unable to reach patient. HIPAA compliant voice message left with call back phone number.   PLAN;  RNCM will attempt 2nd telephone outreach to patient within 2 weeks.  Quinn Plowman RN,BSN,CCM [facility] Telephonic  [phone]

## 2016-01-07 ENCOUNTER — Ambulatory Visit (INDEPENDENT_AMBULATORY_CARE_PROVIDER_SITE_OTHER): Payer: Commercial Managed Care - HMO | Admitting: Family Medicine

## 2016-01-07 ENCOUNTER — Other Ambulatory Visit: Payer: Self-pay

## 2016-01-07 ENCOUNTER — Encounter: Payer: Self-pay | Admitting: Family Medicine

## 2016-01-07 VITALS — BP 120/68 | HR 91 | Temp 97.8°F | Wt 215.0 lb

## 2016-01-07 DIAGNOSIS — Z5189 Encounter for other specified aftercare: Secondary | ICD-10-CM | POA: Diagnosis not present

## 2016-01-07 DIAGNOSIS — Z794 Long term (current) use of insulin: Secondary | ICD-10-CM

## 2016-01-07 DIAGNOSIS — E0810 Diabetes mellitus due to underlying condition with ketoacidosis without coma: Secondary | ICD-10-CM

## 2016-01-07 DIAGNOSIS — N179 Acute kidney failure, unspecified: Secondary | ICD-10-CM

## 2016-01-07 DIAGNOSIS — E1165 Type 2 diabetes mellitus with hyperglycemia: Secondary | ICD-10-CM

## 2016-01-07 DIAGNOSIS — G8929 Other chronic pain: Secondary | ICD-10-CM

## 2016-01-07 DIAGNOSIS — E1110 Type 2 diabetes mellitus with ketoacidosis without coma: Secondary | ICD-10-CM

## 2016-01-07 DIAGNOSIS — R109 Unspecified abdominal pain: Secondary | ICD-10-CM | POA: Diagnosis not present

## 2016-01-07 DIAGNOSIS — R1031 Right lower quadrant pain: Secondary | ICD-10-CM | POA: Diagnosis not present

## 2016-01-07 LAB — BASIC METABOLIC PANEL
BUN: 21 mg/dL (ref 6–23)
CALCIUM: 9.7 mg/dL (ref 8.4–10.5)
CO2: 25 mEq/L (ref 19–32)
CREATININE: 1.41 mg/dL (ref 0.40–1.50)
Chloride: 101 mEq/L (ref 96–112)
GFR: 54.67 mL/min — AB (ref >60.00)
GLUCOSE: 113 mg/dL — AB (ref 70–99)
Potassium: 4.3 mEq/L (ref 3.5–5.1)
SODIUM: 136 meq/L (ref 135–145)

## 2016-01-07 LAB — CBC
HEMATOCRIT: 35.7 % — AB (ref 39.0–52.0)
HEMOGLOBIN: 12.1 g/dL — AB (ref 13.0–17.0)
MCHC: 33.9 g/dL (ref 30.0–36.0)
MCV: 91.4 fl (ref 78.0–100.0)
PLATELETS: 325 10*3/uL (ref 150.0–400.0)
RBC: 3.91 Mil/uL — ABNORMAL LOW (ref 4.22–5.81)
RDW: 13.1 % (ref 11.5–15.5)
WBC: 6.2 10*3/uL (ref 4.0–10.5)

## 2016-01-07 LAB — POC URINALSYSI DIPSTICK (AUTOMATED)
BILIRUBIN UA: NEGATIVE
Glucose, UA: NEGATIVE
KETONES UA: NEGATIVE
Leukocytes, UA: NEGATIVE
Nitrite, UA: NEGATIVE
PH UA: 7
Protein, UA: NEGATIVE
RBC UA: NEGATIVE
SPEC GRAV UA: 1.015
Urobilinogen, UA: 0.2

## 2016-01-07 MED ORDER — GLUCOSE BLOOD VI STRP: ORAL_STRIP | Status: DC

## 2016-01-07 NOTE — Patient Outreach (Addendum)
[place] [facility]) Care Management  01/07/2016  [name] 1955-11-16 [id number]   SUBJECTIVE;  Telephone call to patient regarding [employer] referral.  HIPAA verified with patient.  RNCM made return call to patient to complete screening and referral to [facility] case managers.  Patient confirms he was discharged from the hospital on 12/30/15.  Patient states he is a heart transplant patient and is followed in [place] by the transplant team.  Patient reports he had a follow up appointment with his primary MD on today  01/07/16. Patient states his doctor increased his insulin and requested he sign up for a diabetic education class. Patient states he has a follow up visit scheduled with his primary MD in 2 weeks.  Patient reports he has all of his medications and he takes his medications as prescribed by his doctors. Patient states he has support from his mother who takes him to his doctor appointments.   ASSESSMENT; [employer] referral:  Newly diagnosed insulin dependent diabetic with A1c 13.5.  Ongoing diabetic education due to recent hospitalization for DKA 12/26/15 to 12/30/15. Per primary MD visit on 01/07/16:  A1c 13.5.  Primary MD encouraged patient to schedule diabetic education classes. Urology consult made.  Medication increase from Lantus 4 units to Lantus 5 units and Metformin  1000 mg BID. Patient to follow up with primary MD in 2 weeks.   PLAN;  RNCM will refer patient to community case manager for transition of care.   Quinn Plowman RN,BSN,CCM [facility] Telephonic  [phone]

## 2016-01-07 NOTE — Assessment & Plan Note (Addendum)
S: patient has had recurrent episodes of suprapubic abdominal pain when his bladder fills up, better when he empties bladder. Had UTI over a year ago. He feels like he empties a large volume when he urinates and doesn't really desire to urinate or feel need before that time A/P: patient would like urology consult- I have placed that at this time. Given urecholine in hospital but he does not want to take this before urology consult.

## 2016-01-07 NOTE — Patient Outreach (Signed)
[place] [facility]) Care Management  01/07/2016  [name] Jul 28, 1956 [id number]   SUBJECTIVE: Telephone call to patient regarding [employer] direct referral.  HIPAA verified with patient. Discussed and offered [facility] care management services to patient. Patient verbally agreed to services. Patient requested a call back to speak further due to not being at home.   PLAN;  RNCM will call patient within  3 days.   Quinn Plowman RN,BSN,CCM [facility] Telephonic  [phone]

## 2016-01-07 NOTE — Assessment & Plan Note (Signed)
Resolution in hospital- sugars doing much better. Counseled on importance fo compliance

## 2016-01-07 NOTE — Patient Instructions (Signed)
For diabetes Goal in the morning is 80-120, we will work slowly towards this.  Goal before meals under 150. Goal 2 hours after meals under 180.  Call me for any numbers under 80  Continue Lantus 24 units daily Increase Novolog at meals to 5 units. Continue metformin 1g twice a day  Follow up in 2 weeks  Labs before you go  We will call you within a week about your referral to eye doctor and to urology. If you do not hear within 2 weeks, give [place] a call.

## 2016-01-07 NOTE — Assessment & Plan Note (Addendum)
A1c in 2010 at 10.2.  Diet/exercise controlled for years then worsened to a1c up to 13.5 and started on insulin after DKA. Had already been on metformin at time of DKA  Fasting 108-155, post meals tend to be higher going up to 200 particularly before bed. Will continue lantus 24 units and metformin 1g BID. Will increase pre meal from 4 units TID to 5 units TID. Follow up in 2 weeks. Goal ranges on AVS given. Encouraged him to schedule the diabetes education. Had been hesitant to start metformin previously but apparently heart failure clinic did at 500mg  BID- asked him toa sk them at follow up about 1000mg  BID dosing- I believe this should be fine since they started the lower dose.

## 2016-01-07 NOTE — Progress Notes (Signed)
Subjective:  [name] is a 60 y.o. year old very pleasant male patient who presents for transitional care management and hospital follow up for DKA. Patient was hospitalized from 12/26/15 to 12/30/15. A TCM phone call was completed on 12/31/15. Medical complexity high (but outside range for high complexity visit) ROS- no hypoglycemia symptoms, no chest pain or shortness of breath  60 year old male with history heart transplant, CAD, AICD, HTN, HLD, hypothyroidism, DM2 who presented to ED with 6 day history of fatigue, nausea, low appetite, mild shortness of breath and cough and found to be in DKA. Had been seen by our office a few days prior and started on doxycline for bronchitis. Patient mentioned sugars had been running higher and a1c was over 13 so plan was to return within a few days to start insulin but unfortunately patient worsened before that time.   Patient was found to be in DKA in the hospital. He had previously been diet and exercise controlled with his diabetes though from 01/04/15 to 08/29/15 a1c went from 6.1 to 8, patient had planned to tighten up diet as he had previously been up to 10 and turned around with minimal medication assistance. I had not started metformin at that time but plan was for him to ask heart failure clinic about this option. When he saw our office he was started on metformin 1g BID. When he left the hospital he was discharged on Lantus 24 units as well as novolog 4 units 3x a day with meals.   Fasting 108-155 in recent days. Ran out of strips after last test yesterday May 1st-Day prior was 138 in AM, 144 before bed.  April 30th- Day prior to that was 130 fasting, 211 before bed.  April 29th- 150 fasting, before dinner 139, before bed 215.   Unfortunately ran out of strips yesterday, we are reaching out to [employer] as apparently he needs new meter and strips. 128 fasting this morning  Patient had AKI in hospital that resolved before discharge- was restarted on  diureticsand lasix with CHF history. Abdominal ultrasound showed no acut efindings- did have some gallstones and small polyps potentially within bladder with small right renal cysts but no further follow up suggested.   Heart transplant- continue prograf, cellcept. Has [facility] outpatient follow up  Hypothyroidism- continued on levothyroxine Lab Results  Component Value Date   TSH 1.58 02/12/2014    Hypertension- controlled on amlodipine, cardizem. Also lasix when restarted in hospitla.   Went home with PT at home and rolling walker- he states he did not need either of these and his mobility has no issues.   Past Medical History-  Patient Active Problem List   Diagnosis Date Noted  . Heart transplanted ([place]) 06/19/2014    Priority: High  . Acute on chronic systolic heart failure ([place]) 03/02/2009    Priority: High  . 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    Priority: High  . 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 Diabetes mellitus type II, uncontrolled ([place]) 12/13/2008    Priority: High  . Coronary atherosclerosis 04/25/2007    Priority: High  . Chronic abdominal pain 12/27/2015    Priority: Medium  . BPH (benign prostatic hypertrophy) 02/26/2014    Priority: Medium  . Hypothyroidism 05/14/2008    Priority: Medium  . Essential hypertension 05/14/2008    Priority: Medium  . ANEMIA, OTHER UNSPEC 04/03/2008    Priority: Medium  . Hyperlipidemia 04/25/2007  Priority: Medium  . GERD (gastroesophageal reflux disease) 06/19/2014    Priority: Low  . Osteoarthritis 06/19/2014    Priority: Low  . Special screening for malignant neoplasms, colon 09/17/2013    Priority: Low  . OBESITY 02/26/2009    Priority: Low    Medications- reviewed and updated Current Outpatient Prescriptions  Medication Sig Dispense Refill  . amLODipine (NORVASC) 5 MG tablet Take 5 mg by mouth daily.    [name] aspirin EC 81 MG tablet Take 81 mg by mouth daily.    [name]  atorvastatin (LIPITOR) 80 MG tablet Take 80 mg by mouth daily.     . bethanechol (URECHOLINE) 10 MG tablet Take 1 tablet (10 mg total) by mouth 3 (three) times daily. 30 tablet 0  . chlorthalidone (HYGROTON) 25 MG tablet Take 25 mg by mouth daily.    . cholecalciferol (VITAMIN D) 1000 UNITS tablet Take 3,000 Units by mouth daily.    [name] diltiazem (TIAZAC) 180 MG 24 hr capsule Take 360 mg by mouth daily.    . furosemide (LASIX) 20 MG tablet Take 20 mg by mouth.     [name] glucose blood (ONE TOUCH TEST STRIPS) test strip Use to test blood sugars daily. Dx: e11.9 100 each 12  . insulin aspart (NOVOLOG) 100 UNIT/ML injection Inject 4 Units into the skin 3 (three) times daily with meals. 10 mL 11  . insulin glargine (LANTUS) 100 UNIT/ML injection Inject 0.24 mLs (24 Units total) into the skin at bedtime. 10 mL 11  . insulin starter kit- pen needles MISC 1 kit by Other route once. 1 kit 0  . levothyroxine (SYNTHROID, LEVOTHROID) 75 MCG tablet Take 75 mcg by mouth daily before breakfast.     . lisinopril (PRINIVIL,ZESTRIL) 40 MG tablet Take 40 mg by mouth daily.    . magnesium oxide (MAG-OX) 400 MG tablet Take 500 mg by mouth 2 (two) times daily.    . metFORMIN (GLUCOPHAGE) 1000 MG tablet Take 1 tablet (1,000 mg total) by mouth 2 (two) times daily with a meal. 180 tablet 3  . mycophenolate (CELLCEPT) 500 MG tablet Take 500 mg by mouth 2 (two) times daily.    [name] omeprazole (PRILOSEC) 20 MG capsule Take 20 mg by mouth daily.     . tacrolimus (PROGRAF) 0.5 MG capsule Take 0.5 mg by mouth 2 (two) times daily.    . tamsulosin (FLOMAX) 0.4 MG CAPS Take 0.4 mg by mouth daily.    [name] UNABLE TO FIND Outpatient OT 1 each 0  . glucose blood (ACCU-CHEK AVIVA) test strip Use to test blood sugars 4 times daily. Dx: E11.9 100 each 12   No current facility-administered medications for this visit.    Objective: BP 120/68 mmHg  Pulse 91  Temp(Src) 97.8 F (36.6 C)  Wt 215 lb (97.523 kg) Gen: NAD, resting  comfortably Mucous membranes are moist. CV: RRR no murmurs rubs or gallops Lungs: CTAB no crackles, wheeze, rhonchi Abdomen: soft/nontender/nondistended/normal bowel sounds. No rebound or guarding.  Ext: no edema Skin: warm, dry Neuro: grossly normal, moves all extremities  Assessment/Plan:  Diabetes mellitus type II, uncontrolled (HCC)  A1c in 2010 at 10.2.  Diet/exercise controlled for years then worsened to a1c up to 13.5 and started on insulin after DKA. Had already been on metformin at time of DKA  Fasting 108-155, post meals tend to be higher going up to 200 particularly before bed. Will continue lantus 24 units and metformin 1g BID. Will increase pre meal from 4 units  TID to 5 units TID. Follow up in 2 weeks. Goal ranges on AVS given. Encouraged him to schedule the diabetes education. Had been hesitant to start metformin previously but apparently heart failure clinic did at '500mg'$  BID- asked him toa sk them at follow up about '1000mg'$  BID dosing- I believe this should be fine since they started the lower dose.   DKA (diabetic ketoacidoses) ([place]) Resolution in hospital- sugars doing much better. Counseled on importance fo compliance  AKI (acute kidney injury) ([place]) Resolved before leaving hospital- will recheck bmet today though.   Chronic abdominal pain S: patient has had recurrent episodes of suprapubic abdominal pain when his bladder fills up, better when he empties bladder. Had UTI over a year ago. He feels like he empties a large volume when he urinates and doesn't really desire to urinate or feel need before that time A/P: patient would like urology consult- I have placed that at this time. Given urecholine in hospital but he does not want to take this before urology consult.     No Follow-up on file. Return precautions advised.   Orders Placed This Encounter  Procedures  . Urine culture    [facility]  . CBC    [place]  . Basic metabolic panel    [place]  . Ambulatory  referral to Ophthalmology    Referral Priority:  Routine    Referral Type:  Consultation    Referral Reason:  Specialty Services Required    Requested Specialty:  Ophthalmology    Number of Visits Requested:  1  . Ambulatory referral to Urology    Referral Priority:  Routine    Referral Type:  Consultation    Referral Reason:  Specialty Services Required    Requested Specialty:  Urology    Number of Visits Requested:  1  . POCT Urinalysis Dipstick (Automated)    Meds ordered this encounter  Medications  . DISCONTD: glucose blood (ACCU-CHEK AVIVA) test strip    Sig: Use to test blood sugars daily. Dx: E11.9    Dispense:  100 each    Refill:  12  . glucose blood (ACCU-CHEK AVIVA) test strip    Sig: Use to test blood sugars 4 times daily. Dx: E11.9    Dispense:  100 each    Refill:  12    Garret Reddish, MD

## 2016-01-07 NOTE — Assessment & Plan Note (Signed)
Resolved before leaving hospital- will recheck bmet today though.

## 2016-01-10 LAB — URINE CULTURE

## 2016-01-12 ENCOUNTER — Other Ambulatory Visit: Payer: Self-pay | Admitting: Family Medicine

## 2016-01-12 MED ORDER — AMOXICILLIN-POT CLAVULANATE 875-125 MG PO TABS: 1.0000 | ORAL_TABLET | Freq: Two times a day (BID) | ORAL | Status: DC

## 2016-01-13 ENCOUNTER — Other Ambulatory Visit: Payer: Self-pay | Admitting: *Deleted

## 2016-01-13 NOTE — Patient Outreach (Signed)
[place] [facility]) Care Management  01/13/2016  [name] [birth year] [id number]   Transition of care  RN spoke briefly with the pt's spouse who provided a better connect number to reach this pt however indicates both her and the pt were leaving the house at this time and requested RN call back later this afternoon. RN will call back later today in an attempt to initiate [facility] services.  Raina Mina, RN Care Management Coordinator [place] Office [phone]

## 2016-01-13 NOTE — Patient Outreach (Addendum)
[place] [facility]) Care Management  01/13/2016  MADSEN FROHN 08/14/1956 [id number]    Transition of care ([place])  RN spoke with pt today and introduced the [facility] program/services and the purpose of today's call. Discussed pt's diabetes as pt indicates he has had diabetes once before and controlled it however it has returned and will attempt to control it once again. Pt aware this his A1C is 13.5 and willing to work on improving this number mentioning a low number from Dec. Pt reports his CBG in the mornings range form 76-90 before meals. RN offered community home visits however pt decline but once again receptive to ongoing transition of care with telephonic education. RN offered to send printable EMMI or other education measures but pt declined and again only willing to except telephonic contacts over the next few weeks. RN able to complete the transition of care template. RN generated a plan of care along with a discussion on prevention measures on readmission of hospitalization related to his diabetes by adhering to daily monitoring and adjustments through healthier eating habits, adherence with prescribed daily medications and office appointments (pt willing to comply). RN also verified pt has attended the scheduled appointment with his provider Dr. Yong Channel on 5/3. No other inquires or requested as RN mentioned completing an initial assessment within the next few weeks by gathering additional information (pt receptive). Offered to schedule the next telephonic call for next week as pt requested a late call back on 5/16. Will scheduled and follow up accordingly.  Patient was recently discharged from hospital and all medications have been reviewed.  Raina Mina, RN Care Management Coordinator [place] Office [phone]

## 2016-01-20 ENCOUNTER — Other Ambulatory Visit: Payer: Self-pay | Admitting: *Deleted

## 2016-01-20 NOTE — Patient Outreach (Signed)
[place] [facility]) Care Management  01/20/2016  DARICK SAFER 04-01-1956 [id number]   Transition of care  RN spoke with pt today and inquired on his management of care. Pt reports BS of 76-89 with no major issues. States he is doing well with no encountered problems. RN continued to offer home visit (decline) and to mail educational information and local resources via [facility] calendar to pt however pt declined.  Other benefits discussed based upon pt's recent discharge from the hospital concerning [employer] meals and mail order pharmacy. Pt has had the [employer] meals and familiar with such services. RN continued to stress the importance of managing his diabetes and review the plan of care and goals generated earlier this month. Will re-evaluate next month prior to case closure if pt cotninues to do well and meets his goals.  Initial assessment completed today as pt very receptive to the inquires. Pt continues to be receptive to ongoing transition of care calls and allow RN to schedule a follow up call for next week.  Raina Mina, RN Care Management Coordinator [place] Office [phone]

## 2016-01-23 ENCOUNTER — Ambulatory Visit (INDEPENDENT_AMBULATORY_CARE_PROVIDER_SITE_OTHER): Payer: Commercial Managed Care - HMO | Admitting: Family Medicine

## 2016-01-23 ENCOUNTER — Encounter: Payer: Self-pay | Admitting: Family Medicine

## 2016-01-23 VITALS — BP 132/78 | HR 92 | Temp 98.0°F | Ht 68.0 in | Wt 220.5 lb

## 2016-01-23 DIAGNOSIS — G8929 Other chronic pain: Secondary | ICD-10-CM

## 2016-01-23 DIAGNOSIS — I10 Essential (primary) hypertension: Secondary | ICD-10-CM

## 2016-01-23 DIAGNOSIS — N3000 Acute cystitis without hematuria: Secondary | ICD-10-CM

## 2016-01-23 DIAGNOSIS — E1165 Type 2 diabetes mellitus with hyperglycemia: Secondary | ICD-10-CM

## 2016-01-23 DIAGNOSIS — R109 Unspecified abdominal pain: Secondary | ICD-10-CM

## 2016-01-23 DIAGNOSIS — Z794 Long term (current) use of insulin: Secondary | ICD-10-CM

## 2016-01-23 LAB — BASIC METABOLIC PANEL
BUN: 19 mg/dL (ref 6–23)
CHLORIDE: 105 meq/L (ref 96–112)
CO2: 23 meq/L (ref 19–32)
CREATININE: 1.15 mg/dL (ref 0.40–1.50)
Calcium: 9.3 mg/dL (ref 8.4–10.5)
GFR: 69.16 mL/min (ref >60.00)
GLUCOSE: 80 mg/dL (ref 70–99)
Potassium: 4.7 mEq/L (ref 3.5–5.1)
Sodium: 136 mEq/L (ref 135–145)

## 2016-01-23 NOTE — Progress Notes (Signed)
Subjective:  [name] is a 60 y.o. year old very pleasant male patient who presents for/with See problem oriented charting ROS- hypoglycemia on 2 occasions. No chest pain or shortness of breath. .see any ROS included in HPI as well.   Past Medical History-  Patient Active Problem List   Diagnosis Date Noted  . Heart transplanted ([place]) 06/19/2014    Priority: High  . Acute on chronic systolic heart failure ([place]) 03/02/2009    Priority: High  . 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    Priority: High  . 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 Diabetes mellitus type II, uncontrolled ([place]) 12/13/2008    Priority: High  . Coronary atherosclerosis 04/25/2007    Priority: High  . Chronic abdominal pain 12/27/2015    Priority: Medium  . BPH (benign prostatic hypertrophy) 02/26/2014    Priority: Medium  . Hypothyroidism 05/14/2008    Priority: Medium  . Essential hypertension 05/14/2008    Priority: Medium  . ANEMIA, OTHER UNSPEC 04/03/2008    Priority: Medium  . Hyperlipidemia 04/25/2007    Priority: Medium  . GERD (gastroesophageal reflux disease) 06/19/2014    Priority: Low  . Osteoarthritis 06/19/2014    Priority: Low  . Special screening for malignant neoplasms, colon 09/17/2013    Priority: Low  . OBESITY 02/26/2009    Priority: Low    Medications- reviewed and updated Current Outpatient Prescriptions  Medication Sig Dispense Refill  . amLODipine (NORVASC) 5 MG tablet Take 5 mg by mouth daily.    [name] aspirin EC 81 MG tablet Take 81 mg by mouth daily.    [name] atorvastatin (LIPITOR) 80 MG tablet Take 80 mg by mouth daily.     . bethanechol (URECHOLINE) 10 MG tablet Take 1 tablet (10 mg total) by mouth 3 (three) times daily. 30 tablet 0  . chlorthalidone (HYGROTON) 25 MG tablet Take 25 mg by mouth daily.    . cholecalciferol (VITAMIN D) 1000 UNITS tablet Take 3,000 Units by mouth daily.    [name] diltiazem (TIAZAC) 180 MG 24 hr capsule  Take 360 mg by mouth daily.    [name] glucose blood (ACCU-CHEK AVIVA) test strip Use to test blood sugars 4 times daily. Dx: E11.9 100 each 12  . glucose blood (ONE TOUCH TEST STRIPS) test strip Use to test blood sugars daily. Dx: e11.9 100 each 12  . insulin aspart (NOVOLOG) 100 UNIT/ML injection Inject 4 Units into the skin 3 (three) times daily with meals. 10 mL 11  . insulin glargine (LANTUS) 100 UNIT/ML injection Inject 0.24 mLs (24 Units total) into the skin at bedtime. 10 mL 11  . insulin starter kit- pen needles MISC 1 kit by Other route once. 1 kit 0  . levothyroxine (SYNTHROID, LEVOTHROID) 75 MCG tablet Take 75 mcg by mouth daily before breakfast.     . lisinopril (PRINIVIL,ZESTRIL) 40 MG tablet Take 40 mg by mouth daily.    . magnesium oxide (MAG-OX) 400 MG tablet Take 500 mg by mouth 2 (two) times daily.    . metFORMIN (GLUCOPHAGE) 1000 MG tablet Take 1 tablet (1,000 mg total) by mouth 2 (two) times daily with a meal. 180 tablet 3  . mycophenolate (CELLCEPT) 500 MG tablet Take 500 mg by mouth 2 (two) times daily.    [name] omeprazole (PRILOSEC) 20 MG capsule Take 20 mg by mouth daily.     . tacrolimus (PROGRAF) 0.5 MG capsule Take 1 mg by mouth 2 (two) times  daily.     . tamsulosin (FLOMAX) 0.4 MG CAPS Take 0.4 mg by mouth daily.    [name] UNABLE TO FIND Outpatient OT 1 each 0   No current facility-administered medications for this visit.    Objective: BP 132/78 mmHg  Pulse 92  Temp(Src) 98 F (36.7 C) (Oral)  Ht '5\' 8"'$  (1.727 m)  Wt 220 lb 8 oz (100.018 kg)  BMI 33.53 kg/m2 Gen: NAD, resting comfortably CV: RRR no murmurs rubs or gallops Lungs: CTAB no crackles, wheeze, rhonchi Abdomen: soft/nontender/nondistended/normal bowel sounds.  Ext: no edema Skin: warm, dry Neuro: grossly normal, moves all extremities  Assessment/Plan:  Diabetes mellitus type II, uncontrolled (HCC) S:  Much improved control of diabetes.76-150 fasting with 2 outliers 189, 193. Last week 76-115. Other checks  in the day ranging 66-199 but almost always under 150. Compliant with fast acting insulin 5 units 3 times a day. As well as long-acting 24 units once a day. He has had 2 lows in the 66 range one after breakfast and one in the afternoon. A/P: Continue Lantus 24 units. Reduce short-acting insulin to 4 units before breakfast and lunch and continue 5 units before dinner. Follow-up in 3 months. Patient is aware of hypoglycemia precautions and if he is having recurrent issues he will let me know.   Chronic abdominal pain S: patient continues to have recurrent episodes of suprapubic abdominal pain when his bladder fills up. He had a UTI with enterococcus at last visit and was treated with Augmentin for 7 days. He states symptoms are 25% better. Also see prior notes A/P: patient has upcoming urology consult but over month out. Repeat urine culture. May need more prolonged course such as 14 days given a male.     Essential hypertension S: controlled on Amlodipine '5mg'$ , diltiazem, lasix '20mg'$ , lisinopril '40mg'$ , chlorthalidone '25mg'$  BP Readings from Last 3 Encounters:  01/23/16 132/78  01/07/16 120/68  12/30/15 134/64  A/P:Continue current meds:  Doing well  Return in about 3 months (around 04/24/2016) for follow up- or sooner if needed. Return precautions advised.   Orders Placed This Encounter  Procedures  . Urine culture    [facility]  . Basic metabolic panel    [place]    Garret Reddish, MD

## 2016-01-23 NOTE — Assessment & Plan Note (Signed)
S:  Much improved control of diabetes.76-150 fasting with 2 outliers 189, 193. Last week 76-115. Other checks in the day ranging 66-199 but almost always under 150. Compliant with fast acting insulin 5 units 3 times a day. As well as long-acting 24 units once a day. He has had 2 lows in the 66 range one after breakfast and one in the afternoon. A/P: Continue Lantus 24 units. Reduce short-acting insulin to 4 units before breakfast and lunch and continue 5 units before dinner. Follow-up in 3 months. Patient is aware of hypoglycemia precautions and if he is having recurrent issues he will let me know.

## 2016-01-23 NOTE — Patient Instructions (Signed)
Blood and urine before you leave  Glad your sugars are doing so well. Given 2 under 70, reduce morning and lunch dose to 4 units but continue 5 units before dinner. Also continue metformin and Lantus 24 units.

## 2016-01-23 NOTE — Assessment & Plan Note (Signed)
S: patient continues to have recurrent episodes of suprapubic abdominal pain when his bladder fills up. He had a UTI with enterococcus at last visit and was treated with Augmentin for 7 days. He states symptoms are 25% better. Also see prior notes A/P: patient has upcoming urology consult but over month out. Repeat urine culture. May need more prolonged course such as 14 days given a male.

## 2016-01-23 NOTE — Progress Notes (Signed)
Pre visit review using our clinic review tool, if applicable. No additional management support is needed unless otherwise documented below in the visit note. 

## 2016-01-25 LAB — URINE CULTURE
Colony Count: NO GROWTH
Organism ID, Bacteria: NO GROWTH

## 2016-01-26 ENCOUNTER — Other Ambulatory Visit: Payer: Self-pay | Admitting: *Deleted

## 2016-01-26 NOTE — Patient Outreach (Signed)
[facility]) Care Management  01/26/2016  [name] [birth year] [id number]   Transition of care  RN attempted outreach call to pt today however unsuccessful. RN able to leave a HIPAA approved voice message requesting a call back. Will inquire further on pt's ongoing management of care.   Raina Mina, RN Care Management Coordinator [place] Office [phone]

## 2016-01-28 ENCOUNTER — Other Ambulatory Visit: Payer: Self-pay | Admitting: *Deleted

## 2016-01-28 ENCOUNTER — Encounter: Payer: Self-pay | Admitting: *Deleted

## 2016-01-28 NOTE — Patient Outreach (Deleted)
[place] [facility]) Care Management  01/28/2016  DAEJOHN AUMENT 09/21/1955 [id number]   Transition of care  RN spoke with pt today and inquired further on his ongoing management of care. Pt states he is doing well with all AM reads 100 or less with no really low or high readings. States he is managing his diabetes well. Plan of care reviewed as pt continues to work on his goals with no delays or problems mentioned. Additional information gathered via pt's assessment and review problem list and history with this pt. Pt continues to seek only telephone transition of care and agreed to a follow up call over the next two weeks. Will schedule as agreed and continue transition of care calls.  Raina Mina, RN Care Management Coordinator [place] Office [phone]

## 2016-01-28 NOTE — Patient Outreach (Signed)
[place] [facility]) Care Management  01/28/2016  SAVANT VUOLO 1955/12/20 [id number]   Transition of care  RN spoke with pt today and inquired further on his ongoing management of care. Pt states he is doing well with all his AM readings 100 or less on his glucometer reads with no extremely low or high readings. States he is managing his diabetes well. Plan of care reviewed as pt continues to work on his goals with no delays or encountered problems. Additional information gathered via pt's assessment and review the problem list and history with this pt. Pt continues to seek only telephonic transition of care contacts with this RN and agreed to the next follow up call next week. Will schedule and continue ongoing transition of care.  Raina Mina, RN Care Management Coordinator [place] Office [phone]

## 2016-02-03 ENCOUNTER — Ambulatory Visit: Payer: Commercial Managed Care - HMO

## 2016-02-03 ENCOUNTER — Other Ambulatory Visit: Payer: Self-pay | Admitting: *Deleted

## 2016-02-03 DIAGNOSIS — Z0100 Encounter for examination of eyes and vision without abnormal findings: Secondary | ICD-10-CM | POA: Diagnosis not present

## 2016-02-03 DIAGNOSIS — E119 Type 2 diabetes mellitus without complications: Secondary | ICD-10-CM | POA: Diagnosis not present

## 2016-02-03 LAB — HM DIABETES EYE EXAM

## 2016-02-03 NOTE — Patient Outreach (Signed)
[place] [facility]) Care Management  02/03/2016  [name] 1956-03-24 [id number]   Transition of care   RN attempted outreach call to pt today however unsuccessful. RN able to leave a HIPAA approved voice message requesting a call back. Will inquire further on pt's management of care and continue telephone case management services accordingly,  Raina Mina, RN Care Management Coordinator [place] Office [phone]

## 2016-02-06 ENCOUNTER — Encounter: Payer: Self-pay | Admitting: Family Medicine

## 2016-02-10 DIAGNOSIS — T86298 Other complications of heart transplant: Secondary | ICD-10-CM | POA: Diagnosis not present

## 2016-02-10 DIAGNOSIS — I2510 Atherosclerotic heart disease of native coronary artery without angina pectoris: Secondary | ICD-10-CM | POA: Diagnosis not present

## 2016-02-10 DIAGNOSIS — Z941 Heart transplant status: Secondary | ICD-10-CM | POA: Diagnosis not present

## 2016-02-10 DIAGNOSIS — I10 Essential (primary) hypertension: Secondary | ICD-10-CM | POA: Diagnosis not present

## 2016-02-10 DIAGNOSIS — E1165 Type 2 diabetes mellitus with hyperglycemia: Secondary | ICD-10-CM | POA: Diagnosis not present

## 2016-02-10 DIAGNOSIS — Z951 Presence of aortocoronary bypass graft: Secondary | ICD-10-CM | POA: Diagnosis not present

## 2016-02-10 DIAGNOSIS — Z794 Long term (current) use of insulin: Secondary | ICD-10-CM | POA: Diagnosis not present

## 2016-02-10 DIAGNOSIS — T8620 Unspecified complication of heart transplant: Secondary | ICD-10-CM | POA: Diagnosis not present

## 2016-02-10 DIAGNOSIS — Z09 Encounter for follow-up examination after completed treatment for conditions other than malignant neoplasm: Secondary | ICD-10-CM | POA: Diagnosis not present

## 2016-02-10 DIAGNOSIS — E669 Obesity, unspecified: Secondary | ICD-10-CM | POA: Diagnosis not present

## 2016-02-10 DIAGNOSIS — E559 Vitamin D deficiency, unspecified: Secondary | ICD-10-CM | POA: Diagnosis not present

## 2016-02-10 DIAGNOSIS — B259 Cytomegaloviral disease, unspecified: Secondary | ICD-10-CM | POA: Diagnosis not present

## 2016-02-10 DIAGNOSIS — E784 Other hyperlipidemia: Secondary | ICD-10-CM | POA: Diagnosis not present

## 2016-02-10 DIAGNOSIS — E039 Hypothyroidism, unspecified: Secondary | ICD-10-CM | POA: Diagnosis not present

## 2016-02-11 ENCOUNTER — Other Ambulatory Visit: Payer: Self-pay | Admitting: *Deleted

## 2016-02-11 NOTE — Patient Outreach (Signed)
[place]) Care Management  02/11/2016  UNDRAY COLMENERO 22-Jun-1956 [id number]  Transition of care   RN attempted outreach call to pt however unsuccessful. RN left a HIPAA approved voice message requesting a call back. Will reschedule and continue one outreach call accordingly.  Raina Mina, RN Care Management Coordinator [place] Office [phone]

## 2016-02-13 ENCOUNTER — Other Ambulatory Visit: Payer: Self-pay | Admitting: *Deleted

## 2016-02-13 ENCOUNTER — Encounter: Payer: Self-pay | Admitting: *Deleted

## 2016-02-13 NOTE — Patient Outreach (Signed)
[place] [facility]) Care Management  02/13/2016  [name] 1956-08-19 [id number]   Transition of care  RN spoke with pt today earlier then scheduled appointment and inquired on his ongoing management of care. Pt states she continues to do well with last A1C at 7.6 and CBG readings this morning was 112 and remaining in the low 100's. Pt without complaints and grateful for the ongoing follow up calls completed over the last month. No additional inquired or request for community resources at this time based upon pt option to decline community home visits. Pt feels he is able to manager his care and will continue adhere to the education and plan of care discussed today. Note all goals met with pt based upon the 30 days transition of care contacts. No further [facility] services needed at this time. Case will be closed.  Raina Mina, RN Care Management Coordinator [place] Office [phone]

## 2016-02-24 ENCOUNTER — Encounter: Payer: Commercial Managed Care - HMO | Attending: Family Medicine

## 2016-02-24 VITALS — Ht 68.0 in | Wt 222.9 lb

## 2016-02-24 DIAGNOSIS — E1165 Type 2 diabetes mellitus with hyperglycemia: Secondary | ICD-10-CM | POA: Insufficient documentation

## 2016-02-24 DIAGNOSIS — E119 Type 2 diabetes mellitus without complications: Secondary | ICD-10-CM

## 2016-02-24 NOTE — Progress Notes (Signed)

## 2016-02-27 ENCOUNTER — Ambulatory Visit: Payer: Commercial Managed Care - HMO | Admitting: Family Medicine

## 2016-03-02 DIAGNOSIS — E119 Type 2 diabetes mellitus without complications: Secondary | ICD-10-CM

## 2016-03-02 DIAGNOSIS — E1165 Type 2 diabetes mellitus with hyperglycemia: Secondary | ICD-10-CM | POA: Diagnosis not present

## 2016-03-03 NOTE — Progress Notes (Signed)

## 2016-03-16 ENCOUNTER — Encounter: Payer: Commercial Managed Care - HMO | Attending: Family Medicine

## 2016-03-16 DIAGNOSIS — E119 Type 2 diabetes mellitus without complications: Secondary | ICD-10-CM

## 2016-03-16 DIAGNOSIS — E1165 Type 2 diabetes mellitus with hyperglycemia: Secondary | ICD-10-CM | POA: Diagnosis not present

## 2016-03-17 NOTE — Progress Notes (Signed)
Patient was seen on 03/16/16 for the third of a series of three diabetes self-management courses at the [facility].   [place]ina [place] the amount of activity recommended for healthy living . Describe activities suitable for individual needs . Identify ways to regularly incorporate activity into daily life . Identify barriers to activity and ways to over come these barriers  Identify diabetes medications being personally used and their primary action for lowering glucose and possible side effects . Describe role of stress on blood glucose and develop strategies to address psychosocial issues . Identify diabetes complications and ways to prevent them  Explain how to manage diabetes during illness . Evaluate success in meeting personal goal . Establish 2-3 goals that they will plan to diligently work on until they return for the  50-month follow-up visit  Goals:   I will count my carb choices at most meals and snacks  I will be active 30 minutes or more 3 times a week  I will take my diabetes medications as scheduled  Your patient has identified these potential barriers to change:  None stated by pt  Your patient has identified their diabetes self-care support plan as   None stated by pt Plan:  Attend Support Group as desired

## 2016-04-14 DIAGNOSIS — R350 Frequency of micturition: Secondary | ICD-10-CM | POA: Diagnosis not present

## 2016-04-14 DIAGNOSIS — N401 Enlarged prostate with lower urinary tract symptoms: Secondary | ICD-10-CM | POA: Diagnosis not present

## 2016-04-14 DIAGNOSIS — R3912 Poor urinary stream: Secondary | ICD-10-CM | POA: Diagnosis not present

## 2016-04-23 ENCOUNTER — Ambulatory Visit (INDEPENDENT_AMBULATORY_CARE_PROVIDER_SITE_OTHER): Payer: Commercial Managed Care - HMO | Admitting: Family Medicine

## 2016-04-23 ENCOUNTER — Encounter: Payer: Self-pay | Admitting: Family Medicine

## 2016-04-23 VITALS — BP 138/68 | HR 94 | Temp 98.4°F | Ht 68.0 in | Wt 229.0 lb

## 2016-04-23 DIAGNOSIS — IMO0001 Reserved for inherently not codable concepts without codable children: Secondary | ICD-10-CM

## 2016-04-23 DIAGNOSIS — I10 Essential (primary) hypertension: Secondary | ICD-10-CM | POA: Diagnosis not present

## 2016-04-23 DIAGNOSIS — E038 Other specified hypothyroidism: Secondary | ICD-10-CM | POA: Diagnosis not present

## 2016-04-23 DIAGNOSIS — E1165 Type 2 diabetes mellitus with hyperglycemia: Secondary | ICD-10-CM

## 2016-04-23 DIAGNOSIS — I5022 Chronic systolic (congestive) heart failure: Secondary | ICD-10-CM | POA: Diagnosis not present

## 2016-04-23 DIAGNOSIS — E034 Atrophy of thyroid (acquired): Secondary | ICD-10-CM

## 2016-04-23 LAB — POCT GLYCOSYLATED HEMOGLOBIN (HGB A1C): Hemoglobin A1C: 5.3

## 2016-04-23 LAB — GLUCOSE, POCT (MANUAL RESULT ENTRY): POC Glucose: 107 mg/dl — AB (ref 70–99)

## 2016-04-23 NOTE — Patient Instructions (Signed)
Wow! Cant believe improvement in your a1c but am glad. You can continue on metformin only at this point but monitor sugars and if getting regularly above 160 let me know.   No other changes to meds  4 month follow up.

## 2016-04-23 NOTE — Progress Notes (Signed)
Subjective:  [name] is a 60 y.o. year old very pleasant male patient who presents for/with See problem oriented charting ROS- No chest pain or shortness of breath. No headache or blurry vision.see any ROS included in HPI as well.   Past Medical History-  Patient Active Problem List   Diagnosis Date Noted  . Heart transplanted ([place]) 06/19/2014    Priority: High  . Chronic systolic heart failure ([place]) 03/02/2009    Priority: High  . 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    Priority: High  . 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 Diabetes mellitus type II, uncontrolled ([place]) 12/13/2008    Priority: High  . Coronary atherosclerosis 04/25/2007    Priority: High  . Chronic abdominal pain 12/27/2015    Priority: Medium  . BPH (benign prostatic hypertrophy) 02/26/2014    Priority: Medium  . Hypothyroidism 05/14/2008    Priority: Medium  . Essential hypertension 05/14/2008    Priority: Medium  . ANEMIA, OTHER UNSPEC 04/03/2008    Priority: Medium  . Hyperlipidemia 04/25/2007    Priority: Medium  . GERD (gastroesophageal reflux disease) 06/19/2014    Priority: Low  . Osteoarthritis 06/19/2014    Priority: Low  . Special screening for malignant neoplasms, colon 09/17/2013    Priority: Low  . OBESITY 02/26/2009    Priority: Low    Medications- reviewed and updated Current Outpatient Prescriptions  Medication Sig Dispense Refill  . amLODipine (NORVASC) 5 MG tablet Take 5 mg by mouth daily.    [name] aspirin EC 81 MG tablet Take 81 mg by mouth daily.    [name] atorvastatin (LIPITOR) 80 MG tablet Take 80 mg by mouth daily.     . bethanechol (URECHOLINE) 10 MG tablet Take 1 tablet (10 mg total) by mouth 3 (three) times daily. 30 tablet 0  . chlorthalidone (HYGROTON) 25 MG tablet Take 25 mg by mouth daily.    . cholecalciferol (VITAMIN D) 1000 UNITS tablet Take 3,000 Units by mouth daily.    [name] diltiazem (TIAZAC) 180 MG 24 hr capsule Take 360 mg  by mouth daily.    [name] glucose blood (ACCU-CHEK AVIVA) test strip Use to test blood sugars 4 times daily. Dx: E11.9 100 each 12  . glucose blood (ONE TOUCH TEST STRIPS) test strip Use to test blood sugars daily. Dx: e11.9 100 each 12  . insulin aspart (NOVOLOG) 100 UNIT/ML injection Inject 4 Units into the skin 3 (three) times daily with meals. 10 mL 11  . insulin glargine (LANTUS) 100 UNIT/ML injection Inject 0.24 mLs (24 Units total) into the skin at bedtime. 10 mL 11  . insulin starter kit- pen needles MISC 1 kit by Other route once. 1 kit 0  . levothyroxine (SYNTHROID, LEVOTHROID) 75 MCG tablet Take 75 mcg by mouth daily before breakfast.     . lisinopril (PRINIVIL,ZESTRIL) 40 MG tablet Take 40 mg by mouth daily.    . magnesium oxide (MAG-OX) 400 MG tablet Take 500 mg by mouth 2 (two) times daily.    . metFORMIN (GLUCOPHAGE) 1000 MG tablet Take 1 tablet (1,000 mg total) by mouth 2 (two) times daily with a meal. 180 tablet 3  . mycophenolate (CELLCEPT) 500 MG tablet Take 500 mg by mouth 2 (two) times daily.    [name] omeprazole (PRILOSEC) 20 MG capsule Take 20 mg by mouth daily.     . tacrolimus (PROGRAF) 0.5 MG capsule Take 1 mg by mouth 2 (two) times daily.     [name]  tamsulosin (FLOMAX) 0.4 MG CAPS Take 0.4 mg by mouth daily.    [name] UNABLE TO FIND Outpatient OT 1 each 0   No current facility-administered medications for this visit.     Objective: BP 138/68 (BP Location: Left Arm)   Pulse 94   Temp 98.4 F (36.9 C) (Oral)   Ht '5\' 8"'$  (1.727 m)   Wt 229 lb (103.9 kg)   SpO2 98%   BMI 34.82 kg/m  Gen: NAD, resting comfortably CV: RRR no rubs or gallops Lungs: CTAB no crackles, wheeze, rhonchi Abdomen: soft/nontender/nondistended/normal bowel sounds.obese.  Ext: no edema Skin: warm, dry, no rash Neuro: grossly normal, moves all extremities  Assessment/Plan:  Chronic abdominal pain- comes and goes- better recently. UTI treatment did not clear. Urology eval- patient states visit was low  yield- rectal exam was reassuring he states  Essential hypertension S: controlled. On amlodipine '5mg'$ , diltiazem, lasix '20mg'$ , lisinopril '40mg'$ , chlorthalidone '25mg'$  BP Readings from Last 3 Encounters:  04/23/16 138/68  01/23/16 132/78  01/07/16 120/68  A/P:Continue current medications- monitor trend as seems to be increasing  Diabetes mellitus type II, uncontrolled ([place]) S: drastically improved controlled. His theory is that bronchitis and UTI and stress from these spiked his sugars.  At last visit- left On lantus 24 units, short acting insulin 4 units before breakfast and lunch (reduction) continue 5 units before dinner- at last visit. Reduction due to hypoglycemia one after breakfast and one in afternoon.   On June 6th (last time he took short acting) went to [facility] and blood sugar was 91 and was told not to take insulin until after he eats, he hate and then checked when he got home it was in 90s range. Was still taking long ating- stopped this on the 4th of this month.  #s since then have been usually around 100 but not above 150 since stopping the insulin completely. Only taking metform '1000mg'$  BID. Walking 4-5 miles MWF at gym and 2 miles outside with cane.  Lab Results  Component Value Date   HGBA1C 5.3 04/23/2016   HGBA1C 13.5 12/22/2015   HGBA1C 8.0 (H) 08/29/2015   A/P: drastic improvement in a1c despite coming off insulin. continue on metformin '1000mg'$  BID alone as long as sugars below 150 or 160. Let me know otherwise. He is hopeful to go to '500mg'$  BID next visit then potentially back off.   Chronic systolic heart failure (H[facility]) S: appears euvolemic. Compliant with lasix '20mg'$  and lisinopril. Walking 4-5 miles at least 3 days a week in gym then 2 miles outside 2 other days without shortness of breath or chest pain A/P: continue meds as above   Hypothyroidism Labs normal in June 2017 through care everywhere- continue levothyroxine 75 mcg  Return in about 6 months (around  10/24/2016).  Orders Placed This Encounter  Procedures  . POC HgB A1c  . POC Glucose (CBG)  cbg was to make sure meter accurate- he had some concern but was within 3.   Return precautions advised.  Garret Reddish, MD

## 2016-04-23 NOTE — Progress Notes (Signed)
Pre visit review using our clinic review tool, if applicable. No additional management support is needed unless otherwise documented below in the visit note. 

## 2016-04-24 NOTE — Assessment & Plan Note (Signed)
S: controlled. On amlodipine 5mg , diltiazem, lasix 20mg , lisinopril 40mg , chlorthalidone 25mg  BP Readings from Last 3 Encounters:  04/23/16 138/68  01/23/16 132/78  01/07/16 120/68  A/P:Continue current medications- monitor trend as seems to be increasing

## 2016-04-24 NOTE — Assessment & Plan Note (Signed)
S: drastically improved controlled. His theory is that bronchitis and UTI and stress from these spiked his sugars.  At last visit- left On lantus 24 units, short acting insulin 4 units before breakfast and lunch (reduction) continue 5 units before dinner- at last visit. Reduction due to hypoglycemia one after breakfast and one in afternoon.   On June 6th (last time he took short acting) went to [facility] and blood sugar was 91 and was told not to take insulin until after he eats, he hate and then checked when he got home it was in 90s range. Was still taking long ating- stopped this on the 4th of this month.  #s since then have been usually around 100 but not above 150 since stopping the insulin completely. Only taking metform 1000mg  BID. Walking 4-5 miles MWF at gym and 2 miles outside with cane.  Lab Results  Component Value Date   HGBA1C 5.3 04/23/2016   HGBA1C 13.5 12/22/2015   HGBA1C 8.0 (H) 08/29/2015   A/P: drastic improvement in a1c despite coming off insulin. continue on metformin 1000mg  BID alone as long as sugars below 150 or 160. Let me know otherwise. He is hopeful to go to 500mg  BID next visit then potentially back off.

## 2016-04-24 NOTE — Assessment & Plan Note (Signed)
S: appears euvolemic. Compliant with lasix 20mg  and lisinopril. Walking 4-5 miles at least 3 days a week in gym then 2 miles outside 2 other days without shortness of breath or chest pain A/P: continue meds as above

## 2016-04-24 NOTE — Assessment & Plan Note (Signed)
Labs normal in June 2017 through care everywhere- continue levothyroxine 75 mcg

## 2016-07-13 ENCOUNTER — Encounter: Payer: Self-pay | Admitting: Gastroenterology

## 2016-08-11 DIAGNOSIS — Z941 Heart transplant status: Secondary | ICD-10-CM | POA: Diagnosis not present

## 2016-08-11 DIAGNOSIS — Z79899 Other long term (current) drug therapy: Secondary | ICD-10-CM | POA: Diagnosis not present

## 2016-08-11 DIAGNOSIS — B259 Cytomegaloviral disease, unspecified: Secondary | ICD-10-CM | POA: Diagnosis not present

## 2016-08-11 DIAGNOSIS — E559 Vitamin D deficiency, unspecified: Secondary | ICD-10-CM | POA: Diagnosis not present

## 2016-08-11 DIAGNOSIS — R7989 Other specified abnormal findings of blood chemistry: Secondary | ICD-10-CM | POA: Diagnosis not present

## 2016-08-11 DIAGNOSIS — Z48298 Encounter for aftercare following other organ transplant: Secondary | ICD-10-CM | POA: Diagnosis not present

## 2016-08-11 DIAGNOSIS — E785 Hyperlipidemia, unspecified: Secondary | ICD-10-CM | POA: Diagnosis not present

## 2016-08-11 DIAGNOSIS — Z125 Encounter for screening for malignant neoplasm of prostate: Secondary | ICD-10-CM | POA: Diagnosis not present

## 2016-08-11 DIAGNOSIS — E119 Type 2 diabetes mellitus without complications: Secondary | ICD-10-CM | POA: Diagnosis not present

## 2016-08-11 LAB — LIPID PANEL
CHOLESTEROL: 135 mg/dL (ref 0–200)
HDL: 31 mg/dL — AB (ref 35–70)
LDL CALC: 83 mg/dL
TRIGLYCERIDES: 109 mg/dL (ref 40–160)

## 2016-08-11 LAB — TSH: TSH: 1.2 u[IU]/mL (ref <=5.90)

## 2016-08-11 LAB — HEMOGLOBIN A1C: HEMOGLOBIN A1C: 5.4

## 2016-08-20 ENCOUNTER — Ambulatory Visit (INDEPENDENT_AMBULATORY_CARE_PROVIDER_SITE_OTHER): Payer: Commercial Managed Care - HMO | Admitting: Family Medicine

## 2016-08-20 ENCOUNTER — Encounter: Payer: Self-pay | Admitting: Family Medicine

## 2016-08-20 VITALS — BP 130/58 | HR 91 | Temp 97.6°F | Ht 68.0 in | Wt 231.2 lb

## 2016-08-20 DIAGNOSIS — I5022 Chronic systolic (congestive) heart failure: Secondary | ICD-10-CM

## 2016-08-20 DIAGNOSIS — Z941 Heart transplant status: Secondary | ICD-10-CM | POA: Diagnosis not present

## 2016-08-20 DIAGNOSIS — Z23 Encounter for immunization: Secondary | ICD-10-CM

## 2016-08-20 DIAGNOSIS — E034 Atrophy of thyroid (acquired): Secondary | ICD-10-CM

## 2016-08-20 DIAGNOSIS — I10 Essential (primary) hypertension: Secondary | ICD-10-CM | POA: Diagnosis not present

## 2016-08-20 DIAGNOSIS — Z794 Long term (current) use of insulin: Secondary | ICD-10-CM

## 2016-08-20 DIAGNOSIS — E1165 Type 2 diabetes mellitus with hyperglycemia: Secondary | ICD-10-CM

## 2016-08-20 DIAGNOSIS — E785 Hyperlipidemia, unspecified: Secondary | ICD-10-CM

## 2016-08-20 NOTE — Assessment & Plan Note (Signed)
S: reasonably controlled on atorvastatin 80mg  with labs done at [facility] about a week ago and LDL under 90. No myalgias.   A/P: continue current medications

## 2016-08-20 NOTE — Assessment & Plan Note (Signed)
S: controlled on Amlodipine 5mg , diltiazem, lisinopril 40mg , chlorthalidone 25mg .  BP Readings from Last 3 Encounters:  08/20/16 (!) 130/58  04/23/16 138/68  01/23/16 132/78  A/P:Continue current medications

## 2016-08-20 NOTE — Progress Notes (Signed)
Pre visit review using our clinic review tool, if applicable. No additional management support is needed unless otherwise documented below in the visit note. 

## 2016-08-20 NOTE — Assessment & Plan Note (Signed)
Annual cath coming up. 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

## 2016-08-20 NOTE — Progress Notes (Signed)
Subjective:  [name] is a 60 y.o. year old very pleasant male patient who presents for/with See problem oriented charting ROS- No chest pain or shortness of breath. No blurry vision. No cough, congestion.    Past Medical History-  Patient Active Problem List   Diagnosis Date Noted  . Heart transplanted ([place]) 06/19/2014    Priority: High  . Chronic systolic heart failure ([place]) 03/02/2009    Priority: High  . 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    Priority: High  . 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 Diabetes mellitus type II, controlled ([place]) 12/13/2008    Priority: High  . Coronary atherosclerosis 04/25/2007    Priority: High  . Chronic abdominal pain 12/27/2015    Priority: Medium  . BPH (benign prostatic hypertrophy) 02/26/2014    Priority: Medium  . Hypothyroidism 05/14/2008    Priority: Medium  . Essential hypertension 05/14/2008    Priority: Medium  . ANEMIA, OTHER UNSPEC 04/03/2008    Priority: Medium  . Hyperlipidemia 04/25/2007    Priority: Medium  . GERD (gastroesophageal reflux disease) 06/19/2014    Priority: Low  . Osteoarthritis 06/19/2014    Priority: Low  . Special screening for malignant neoplasms, colon 09/17/2013    Priority: Low  . OBESITY 02/26/2009    Priority: Low    Medications- reviewed and updated Current Outpatient Prescriptions  Medication Sig Dispense Refill  . amLODipine (NORVASC) 5 MG tablet Take 5 mg by mouth daily.    [name] aspirin EC 81 MG tablet Take 81 mg by mouth daily.    [name] atorvastatin (LIPITOR) 80 MG tablet Take 80 mg by mouth daily.     . bethanechol (URECHOLINE) 10 MG tablet Take 1 tablet (10 mg total) by mouth 3 (three) times daily. 30 tablet 0  . chlorthalidone (HYGROTON) 25 MG tablet Take 25 mg by mouth daily.    . cholecalciferol (VITAMIN D) 1000 UNITS tablet Take 3,000 Units by mouth daily.    [name] diltiazem (TIAZAC) 180 MG 24 hr capsule Take 360 mg by mouth daily.    [name]  glucose blood (ACCU-CHEK AVIVA) test strip Use to test blood sugars 4 times daily. Dx: E11.9 100 each 12  . glucose blood (ONE TOUCH TEST STRIPS) test strip Use to test blood sugars daily. Dx: e11.9 100 each 12  . insulin starter kit- pen needles MISC 1 kit by Other route once. 1 kit 0  . levothyroxine (SYNTHROID, LEVOTHROID) 75 MCG tablet Take 75 mcg by mouth daily before breakfast.     . lisinopril (PRINIVIL,ZESTRIL) 40 MG tablet Take 40 mg by mouth daily.    . magnesium oxide (MAG-OX) 400 MG tablet Take 500 mg by mouth 2 (two) times daily.    . metFORMIN (GLUCOPHAGE) 1000 MG tablet Take 1 tablet (1,000 mg total) by mouth 2 (two) times daily with a meal. 180 tablet 3  . mycophenolate (CELLCEPT) 500 MG tablet Take 500 mg by mouth 2 (two) times daily.    [name] omeprazole (PRILOSEC) 20 MG capsule Take 20 mg by mouth daily.     . tacrolimus (PROGRAF) 0.5 MG capsule Take 1 mg by mouth 2 (two) times daily.     . tamsulosin (FLOMAX) 0.4 MG CAPS Take 0.4 mg by mouth daily.    [name] UNABLE TO FIND Outpatient OT 1 each 0   No current facility-administered medications for this visit.     Objective: BP (!) 130/58 (BP Location: Left Arm, Patient Position:  Sitting, Cuff Size: Large)   Pulse 91   Temp 97.6 F (36.4 C) (Oral)   Ht _0  (1.727 m)   Wt 231 lb 3.2 oz (104.9 kg)   SpO2 97%   BMI 35.15 kg/m  Gen: NAD, resting comfortably CV: RRR no murmurs rubs or gallops Lungs: CTAB no crackles, wheeze, rhonchi Abdomen: obese  Ext: no edema Skin: warm, dry, no rash  Assessment/Plan:  Fit bit showed high HR today and he felt normally- normal high HR on exam- will monitor- sees cardiology tuesday   Diabetes mellitus type II, controlled ([place]) S: well controlled. On metformin. a1c 5.4 at [facility] Exercise and diet- encouraged healthy eating, regular exercise. Rather low motivation Lab Results  Component Value Date   HGBA1C 5.3 04/23/2016   HGBA1C 13.5 12/22/2015   HGBA1C 8.0 (H) 08/29/2015   A/P: continue  current medications- 1g BID  Essential hypertension S: controlled on Amlodipine 21m, diltiazem, lisinopril 440m chlorthalidone 2515m BP Readings from Last 3 Encounters:  08/20/16 (!) 130/58  04/23/16 138/68  01/23/16 132/78  A/P:Continue current medications   [facility]. 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 No myalgias.   A/P: continue current medications  Hypothyroidism S: controlled on Levothyroxine 75mc34mP:[facility]  heart failure stable in transplant patient. Compliant with lisinopril. Does not need lasix nad no signs of fluid overload  Return in about 6 months (around 02/18/2017) for physical. Considering awv  Orders Placed This Encounter  Procedures  . Flu Vaccine QUAD 36+ mos IM  . Lipid panel    This external order was created through the Results Console.  . Hemoglobin A1c    This external order was created through the Results Console.  . TSH    This external order was created through the Results Console.   Return precautions advised.  StephGarret Reddish

## 2016-08-20 NOTE — Assessment & Plan Note (Signed)
S: well controlled. On metformin. a1c 5.4 at [facility] Exercise and diet- encouraged healthy eating, regular exercise. Rather low motivation Lab Results  Component Value Date   HGBA1C 5.3 04/23/2016   HGBA1C 13.5 12/22/2015   HGBA1C 8.0 (H) 08/29/2015   A/P: continue current medications- 1g BID

## 2016-08-20 NOTE — Assessment & Plan Note (Signed)
S: controlled on Levothyroxine 29mcg A/P:TSH through [facility]- continue current medications

## 2016-08-20 NOTE — Patient Instructions (Addendum)
No changes today   Thanks for your doing your flu shot  I would also like for you to sign up for an annual wellness visit with our nurse, Manuela Schwartz, within next few months who specializes in the annual wellness exam. This is a free benefit under medicare that may help [place] find additional ways to help you. Some highlights are reviewing medications, lifestyle, and doing a dementia screen.

## 2016-08-24 DIAGNOSIS — E119 Type 2 diabetes mellitus without complications: Secondary | ICD-10-CM | POA: Diagnosis not present

## 2016-08-24 DIAGNOSIS — E669 Obesity, unspecified: Secondary | ICD-10-CM | POA: Diagnosis not present

## 2016-08-24 DIAGNOSIS — E039 Hypothyroidism, unspecified: Secondary | ICD-10-CM | POA: Diagnosis not present

## 2016-08-24 DIAGNOSIS — Z79899 Other long term (current) drug therapy: Secondary | ICD-10-CM | POA: Diagnosis not present

## 2016-08-24 DIAGNOSIS — Z87891 Personal history of nicotine dependence: Secondary | ICD-10-CM | POA: Diagnosis not present

## 2016-08-24 DIAGNOSIS — Z941 Heart transplant status: Secondary | ICD-10-CM | POA: Diagnosis not present

## 2016-08-24 DIAGNOSIS — T8621 Heart transplant rejection: Secondary | ICD-10-CM | POA: Diagnosis not present

## 2016-08-24 DIAGNOSIS — E782 Mixed hyperlipidemia: Secondary | ICD-10-CM | POA: Diagnosis not present

## 2016-08-24 DIAGNOSIS — Z7982 Long term (current) use of aspirin: Secondary | ICD-10-CM | POA: Diagnosis not present

## 2016-08-24 DIAGNOSIS — Z794 Long term (current) use of insulin: Secondary | ICD-10-CM | POA: Diagnosis not present

## 2016-08-24 DIAGNOSIS — S2242XA Multiple fractures of ribs, left side, initial encounter for closed fracture: Secondary | ICD-10-CM | POA: Diagnosis not present

## 2016-08-24 DIAGNOSIS — R9431 Abnormal electrocardiogram [ECG] [EKG]: Secondary | ICD-10-CM | POA: Diagnosis not present

## 2016-10-05 DIAGNOSIS — L814 Other melanin hyperpigmentation: Secondary | ICD-10-CM | POA: Diagnosis not present

## 2016-10-05 DIAGNOSIS — D1801 Hemangioma of skin and subcutaneous tissue: Secondary | ICD-10-CM | POA: Diagnosis not present

## 2016-10-05 DIAGNOSIS — D235 Other benign neoplasm of skin of trunk: Secondary | ICD-10-CM | POA: Diagnosis not present

## 2016-10-05 DIAGNOSIS — L813 Cafe au lait spots: Secondary | ICD-10-CM | POA: Diagnosis not present

## 2016-11-30 DIAGNOSIS — R7989 Other specified abnormal findings of blood chemistry: Secondary | ICD-10-CM | POA: Diagnosis not present

## 2016-11-30 DIAGNOSIS — Z941 Heart transplant status: Secondary | ICD-10-CM | POA: Diagnosis not present

## 2016-11-30 DIAGNOSIS — Z79899 Other long term (current) drug therapy: Secondary | ICD-10-CM | POA: Diagnosis not present

## 2016-11-30 DIAGNOSIS — Z48298 Encounter for aftercare following other organ transplant: Secondary | ICD-10-CM | POA: Diagnosis not present

## 2017-01-04 DIAGNOSIS — Z941 Heart transplant status: Secondary | ICD-10-CM | POA: Diagnosis not present

## 2017-01-04 DIAGNOSIS — Z79899 Other long term (current) drug therapy: Secondary | ICD-10-CM | POA: Diagnosis not present

## 2017-01-04 DIAGNOSIS — Z48298 Encounter for aftercare following other organ transplant: Secondary | ICD-10-CM | POA: Diagnosis not present

## 2017-01-04 DIAGNOSIS — R7989 Other specified abnormal findings of blood chemistry: Secondary | ICD-10-CM | POA: Diagnosis not present

## 2017-01-10 DIAGNOSIS — R7989 Other specified abnormal findings of blood chemistry: Secondary | ICD-10-CM | POA: Diagnosis not present

## 2017-01-10 DIAGNOSIS — Z941 Heart transplant status: Secondary | ICD-10-CM | POA: Diagnosis not present

## 2017-01-10 DIAGNOSIS — Z79899 Other long term (current) drug therapy: Secondary | ICD-10-CM | POA: Diagnosis not present

## 2017-01-10 DIAGNOSIS — Z48298 Encounter for aftercare following other organ transplant: Secondary | ICD-10-CM | POA: Diagnosis not present

## 2017-01-24 ENCOUNTER — Other Ambulatory Visit: Payer: Self-pay | Admitting: Adult Health

## 2017-01-24 DIAGNOSIS — R7309 Other abnormal glucose: Secondary | ICD-10-CM

## 2017-01-25 MED ORDER — ASPIRIN EC 81 MG PO TBEC: 81.0000 mg | DELAYED_RELEASE_TABLET | Freq: Every day | ORAL | 0 refills | Status: AC

## 2017-01-25 MED ORDER — DILTIAZEM HCL ER BEADS 180 MG PO CP24: 360.0000 mg | ORAL_CAPSULE | Freq: Every day | ORAL | 0 refills | Status: AC

## 2017-01-25 MED ORDER — ATORVASTATIN CALCIUM 80 MG PO TABS
80.0000 mg | ORAL_TABLET | Freq: Every day | ORAL | 0 refills | Status: AC
Start: 2017-01-25 — End: ?

## 2017-01-25 MED ORDER — MAGNESIUM OXIDE 400 MG PO TABS: 600.0000 mg | ORAL_TABLET | Freq: Two times a day (BID) | ORAL | 0 refills | Status: DC

## 2017-01-25 MED ORDER — BETHANECHOL CHLORIDE 10 MG PO TABS: 10.0000 mg | ORAL_TABLET | Freq: Three times a day (TID) | ORAL | 0 refills | Status: DC

## 2017-01-25 MED ORDER — OMEPRAZOLE 20 MG PO CPDR: 20.0000 mg | DELAYED_RELEASE_CAPSULE | Freq: Every day | ORAL | 0 refills | Status: DC

## 2017-01-25 MED ORDER — TAMSULOSIN HCL 0.4 MG PO CAPS: 0.4000 mg | ORAL_CAPSULE | Freq: Every day | ORAL | 0 refills | Status: DC

## 2017-01-25 MED ORDER — AMLODIPINE BESYLATE 5 MG PO TABS: 5.0000 mg | ORAL_TABLET | Freq: Every day | ORAL | 0 refills | Status: DC

## 2017-01-25 MED ORDER — LISINOPRIL 40 MG PO TABS: 40.0000 mg | ORAL_TABLET | Freq: Every day | ORAL | 0 refills | Status: DC

## 2017-01-25 MED ORDER — LEVOTHYROXINE SODIUM 75 MCG PO TABS: 75.0000 ug | ORAL_TABLET | Freq: Every day | ORAL | 0 refills | Status: AC

## 2017-01-25 MED ORDER — TACROLIMUS 0.5 MG PO CAPS: 1.0000 mg | ORAL_CAPSULE | Freq: Two times a day (BID) | ORAL | 0 refills | Status: AC

## 2017-01-25 MED ORDER — MYCOPHENOLATE MOFETIL 500 MG PO TABS: 500.0000 mg | ORAL_TABLET | Freq: Two times a day (BID) | ORAL | 0 refills | Status: AC

## 2017-01-25 MED ORDER — VITAMIN D 1000 UNITS PO TABS: 3000.0000 [IU] | ORAL_TABLET | Freq: Every day | ORAL | 0 refills | Status: AC

## 2017-01-25 MED ORDER — CHLORTHALIDONE 25 MG PO TABS: 25.0000 mg | ORAL_TABLET | Freq: Every day | ORAL | 0 refills | Status: DC

## 2017-01-27 DIAGNOSIS — E559 Vitamin D deficiency, unspecified: Secondary | ICD-10-CM | POA: Diagnosis not present

## 2017-01-27 DIAGNOSIS — Z79899 Other long term (current) drug therapy: Secondary | ICD-10-CM | POA: Diagnosis not present

## 2017-01-27 DIAGNOSIS — Z48298 Encounter for aftercare following other organ transplant: Secondary | ICD-10-CM | POA: Diagnosis not present

## 2017-01-27 DIAGNOSIS — Z941 Heart transplant status: Secondary | ICD-10-CM | POA: Diagnosis not present

## 2017-01-27 DIAGNOSIS — Z125 Encounter for screening for malignant neoplasm of prostate: Secondary | ICD-10-CM | POA: Diagnosis not present

## 2017-01-27 DIAGNOSIS — R7989 Other specified abnormal findings of blood chemistry: Secondary | ICD-10-CM | POA: Diagnosis not present

## 2017-01-27 DIAGNOSIS — E785 Hyperlipidemia, unspecified: Secondary | ICD-10-CM | POA: Diagnosis not present

## 2017-01-27 LAB — HEMOGLOBIN A1C: HEMOGLOBIN A1C: 5.5

## 2017-01-27 LAB — LIPID PANEL
Cholesterol: 134 (ref 0–200)
HDL: 30 — AB (ref 35–70)
LDL CALC: 83
Triglycerides: 115 (ref 40–160)

## 2017-01-27 LAB — PSA: PSA: 0.5

## 2017-02-01 DIAGNOSIS — Z1211 Encounter for screening for malignant neoplasm of colon: Secondary | ICD-10-CM | POA: Diagnosis not present

## 2017-02-01 DIAGNOSIS — E119 Type 2 diabetes mellitus without complications: Secondary | ICD-10-CM | POA: Diagnosis not present

## 2017-02-01 DIAGNOSIS — Z941 Heart transplant status: Secondary | ICD-10-CM | POA: Diagnosis not present

## 2017-02-01 DIAGNOSIS — I10 Essential (primary) hypertension: Secondary | ICD-10-CM | POA: Diagnosis not present

## 2017-02-01 DIAGNOSIS — Z4821 Encounter for aftercare following heart transplant: Secondary | ICD-10-CM | POA: Diagnosis not present

## 2017-02-01 DIAGNOSIS — I517 Cardiomegaly: Secondary | ICD-10-CM | POA: Diagnosis not present

## 2017-02-01 DIAGNOSIS — E784 Other hyperlipidemia: Secondary | ICD-10-CM | POA: Diagnosis not present

## 2017-02-01 DIAGNOSIS — T86290 Cardiac allograft vasculopathy: Secondary | ICD-10-CM | POA: Diagnosis not present

## 2017-02-01 DIAGNOSIS — E039 Hypothyroidism, unspecified: Secondary | ICD-10-CM | POA: Diagnosis not present

## 2017-02-01 DIAGNOSIS — Z794 Long term (current) use of insulin: Secondary | ICD-10-CM | POA: Diagnosis not present

## 2017-02-21 ENCOUNTER — Ambulatory Visit (INDEPENDENT_AMBULATORY_CARE_PROVIDER_SITE_OTHER): Payer: Medicare HMO | Admitting: Family Medicine

## 2017-02-21 ENCOUNTER — Encounter: Payer: Self-pay | Admitting: Family Medicine

## 2017-02-21 ENCOUNTER — Encounter: Payer: Self-pay | Admitting: Gastroenterology

## 2017-02-21 VITALS — BP 128/68 | HR 85 | Temp 97.7°F | Ht 68.0 in | Wt 234.6 lb

## 2017-02-21 DIAGNOSIS — Z0000 Encounter for general adult medical examination without abnormal findings: Secondary | ICD-10-CM

## 2017-02-21 DIAGNOSIS — E119 Type 2 diabetes mellitus without complications: Secondary | ICD-10-CM

## 2017-02-21 DIAGNOSIS — Z86010 Personal history of colonic polyps: Secondary | ICD-10-CM | POA: Diagnosis not present

## 2017-02-21 DIAGNOSIS — I5022 Chronic systolic (congestive) heart failure: Secondary | ICD-10-CM

## 2017-02-21 DIAGNOSIS — Z941 Heart transplant status: Secondary | ICD-10-CM | POA: Diagnosis not present

## 2017-02-21 DIAGNOSIS — Z860101 Personal history of adenomatous and serrated colon polyps: Secondary | ICD-10-CM

## 2017-02-21 LAB — POC URINALSYSI DIPSTICK (AUTOMATED)
Bilirubin, UA: NEGATIVE
Blood, UA: NEGATIVE
Glucose, UA: NEGATIVE
KETONES UA: NEGATIVE
Leukocytes, UA: NEGATIVE
Nitrite, UA: NEGATIVE
PROTEIN UA: NEGATIVE
SPEC GRAV UA: 1.015 (ref 1.010–1.025)
Urobilinogen, UA: 0.2 E.U./dL
pH, UA: 6 (ref 5.0–8.0)

## 2017-02-21 NOTE — Patient Instructions (Addendum)
We will call you within a week or two about your referral to GI for colonoscopy. If you do not hear within 3 weeks, give [place] a call.   Please see your eye doctor for diabetic eye exam  Please ask [facility] if you can get the new shingles vaccine SHingrix since it is not a live virus  Drop off urine before you go  4-6 month follow up.

## 2017-02-21 NOTE — Progress Notes (Addendum)
Phone: [phone]  Subjective:  Patient presents today for their annual physical. Chief complaint-noted.   See problem oriented charting- ROS- full  review of systems was completed and negative except for: mild intermittent suprapubic discomfort, weight gain due to poor foodchoices  The following were reviewed and entered/updated in epic: Past Medical History:  Diagnosis Date  . Abdominal pain, epigastric 03/31/2009  . Acute on chronic systolic heart failure ([place]) 03/02/2009  . ANEMIA, OTHER UNSPEC 04/03/2008  . 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 Blood transfusion without reported diagnosis   . CHF (congestive heart failure) ([place])   . CORONARY ARTERY DISEASE 04/25/2007  . DIABETES MELLITUS, TYPE II 12/13/2008   no meds, diet contolled  . GERD (gastroesophageal reflux disease)   . HYPERLIPIDEMIA 04/25/2007  . Hypertension   . HYPERTENSION, HX OF 05/14/2008  . 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  . MYOCARDIAL INFARCTION, 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  . OBESITY 02/26/2009  . Thyroid disease   . THYROID DISEASE, HX OF 05/14/2008  . VENTRICULAR TACHYCARDIA 02/26/2009   Patient Active Problem List   Diagnosis Date Noted  . Heart transplanted ([place]) 06/19/2014    Priority: High  . Chronic systolic heart failure ([place]) 03/02/2009    Priority: High  . 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    Priority: High  . 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 Diabetes mellitus type II, controlled ([place]) 12/13/2008    Priority: High  . Coronary atherosclerosis 04/25/2007    Priority: High  . Chronic abdominal pain 12/27/2015    Priority: Medium  . BPH (benign prostatic hypertrophy) 02/26/2014    Priority: Medium  . Hypothyroidism 05/14/2008    Priority: Medium  . Essential hypertension 05/14/2008    Priority: Medium  . ANEMIA, OTHER UNSPEC 04/03/2008    Priority: Medium  . Hyperlipidemia 04/25/2007    Priority: Medium  . GERD (gastroesophageal  reflux disease) 06/19/2014    Priority: Low  . Osteoarthritis 06/19/2014    Priority: Low  . History of adenomatous polyp of colon 09/17/2013    Priority: Low  . OBESITY 02/26/2009    Priority: Low   Past Surgical History:  Procedure Laterality Date  . ANGIOPLASTY     with stent  . CARDIAC ASSIST DEVICE REMOVAL    . CARDIAC CATHETERIZATION    . COLONOSCOPY    . CORONARY ARTERY BYPASS GRAFT    . 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  . LEFT VENTRICULAR ASSIST DEVICE  05/2009   [facility]    Family History  Problem Relation Age of Onset  . Heart disease Mother   . Lung cancer Maternal Grandfather   . Lung cancer Maternal Grandmother   . Colon cancer Neg Hx   . Esophageal cancer Neg Hx   . Prostate cancer Neg Hx   . Rectal cancer Neg Hx   . Stomach cancer Neg Hx     Medications- reviewed and updated Current Outpatient Prescriptions  Medication Sig Dispense Refill  . amLODipine (NORVASC) 5 MG tablet Take 1 tablet (5 mg total) by mouth daily. 30 tablet 0  . aspirin EC 81 MG tablet Take 1 tablet (81 mg total) by mouth daily. 30 tablet 0  . atorvastatin (LIPITOR) 80 MG tablet Take 1 tablet (80 mg total) by mouth daily at 6 PM. 30 tablet 0  . chlorthalidone (HYGROTON) 25 MG tablet Take 1 tablet (25 mg total) by mouth daily. 30 tablet 0  . cholecalciferol (VITAMIN D) 1000 units tablet Take 3 tablets (  3,000 Units total) by mouth daily. 90 tablet 0  . diltiazem (TIAZAC) 180 MG 24 hr capsule Take 2 capsules (360 mg total) by mouth daily. 60 capsule 0  . levothyroxine (SYNTHROID, LEVOTHROID) 75 MCG tablet Take 1 tablet (75 mcg total) by mouth daily before breakfast. 30 tablet 0  . lisinopril (PRINIVIL,ZESTRIL) 40 MG tablet Take 1 tablet (40 mg total) by mouth daily. 30 tablet 0  . magnesium oxide (MAG-OX) 400 MG tablet Take 1.5 tablets (600 mg total) by mouth 2 (two) times daily. 45 tablet 0  . metFORMIN (GLUCOPHAGE) 1000 MG tablet TAKE ONE TABLET BY MOUTH TWICE DAILY WITH MEALS 180 tablet 3  .  mycophenolate (CELLCEPT) 500 MG tablet Take 1 tablet (500 mg total) by mouth 2 (two) times daily. 60 tablet 0  . omeprazole (PRILOSEC) 20 MG capsule Take 1 capsule (20 mg total) by mouth daily. 30 capsule 0  . tacrolimus (PROGRAF) 0.5 MG capsule Take 2 capsules (1 mg total) by mouth 2 (two) times daily. 120 capsule 0  . tamsulosin (FLOMAX) 0.4 MG CAPS capsule Take 1 capsule (0.4 mg total) by mouth daily. 30 capsule 0  . bethanechol (URECHOLINE) 10 MG tablet Take 1 tablet (10 mg total) by mouth 3 (three) times daily. (Patient not taking: Reported on 02/21/2017) 90 tablet 0   No current facility-administered medications for this visit.     Allergies-reviewed and updated No Known Allergies  Social History   Social History  . Marital status: Single    Spouse name: N/A  . Number of children: 1  . Years of education: N/A   Social History Main Topics  . Smoking status: Former Smoker    Packs/day: 1.00    Years: 20.00    Types: Cigarettes    Quit date: 07/02/1999  . Smokeless tobacco: Never Used  . Alcohol use Yes     Comment: rarely  . Drug use: No  . Sexual activity: Not Asked   Other Topics Concern  . None   Social History Narrative   Single. Divorced. 1 son. 1 granddaughter 2014.       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       Hobbies: time on CPU             Objective: BP 128/68 (BP Location: Left Arm, Patient Position: Sitting, Cuff Size: Large)   Pulse 85   Temp 97.7 F (36.5 C) (Oral)   Ht 5\' 8"  (1.727 m)   Wt 234 lb 9.6 oz (106.4 kg)   SpO2 97%   BMI 35.67 kg/m  Gen: NAD, resting comfortably HEENT: Mucous membranes are moist. Oropharynx normal Neck: no thyromegaly CV: RRR no murmurs rubs or gallops Lungs: CTAB no crackles, wheeze, rhonchi Abdomen: soft/nontender/nondistended/normal bowel sounds. No rebound or guarding.  Reducible periumbilical hernia Ext: no edema Skin: warm, dry Neuro: grossly normal, moves all extremities,  PERRLA Rectal: normal tone, normal sized prostate, no masses or tenderness  Diabetic Foot Exam - Simple   Simple Foot Form Diabetic Foot exam was performed with the following findings:  Yes 02/21/2017  8:49 AM  Visual Inspection No deformities, no ulcerations, no other skin breakdown bilaterally:  Yes Sensation Testing Intact to touch and monofilament testing bilaterally:  Yes Pulse Check Posterior Tibialis and Dorsalis pulse intact bilaterally:  Yes Comments     Assessment/Plan:  61 y.o. male presenting for annual physical.  Health Maintenance counseling: 1. Anticipatory guidance: Patient counseled regarding regular dental exams - dentures, eye  exams - May 2017- encouraged follow up this year, wearing seatbelts.  2. Risk factor reduction:  Advised patient of need for regular exercise and diet rich and fruits and vegetables to reduce risk of heart attack and stroke. Exercise- MWF 5 miles on treadmill but remodel of gym and has missed several visits. Diet-discussed decreasing portion size. .  Wt Readings from Last 3 Encounters:  02/21/17 234 lb 9.6 oz (106.4 kg)  08/20/16 231 lb 3.2 oz (104.9 kg)  04/23/16 229 lb (103.9 kg)  3. Immunizations/screenings/ancillary studies- he will discuss shingrix with [facility] Immunization History  Administered Date(s) Administered  . Hepatitis A, Adult 06/09/2012  . Hepatitis B 09/14/2011  . Hepatitis B, adult 05/08/2009, 06/09/2012  . HiB (PRP-OMP) 05/02/2009  . Influenza Split 07/02/2011  . Influenza Whole 06/25/2008  . Influenza, Seasonal, Injecte, Preservative Fre 08/06/2010, 06/09/2012  . Influenza,inj,Quad PF,36+ Mos 08/07/2013, 06/19/2014, 08/29/2015, 08/20/2016  . Influenza-Unspecified 06/19/2014  . PPD Test 05/04/2009, 09/14/2011, 06/09/2012  . Pneumococcal Conjugate-13 07/18/2012  . Pneumococcal Polysaccharide-23 09/06/2009, 06/09/2012  . Pneumococcal-Unspecified 05/02/2009  . Td 05/02/2009  . Tdap 09/14/2011   4. Prostate cancer  screening-   PSA stable at 0.5 from [facility] records x 2 years. Low risk rectal  exam. Does thke flomax and bethanecol for urinary issues Lab Results  Component Value Date   PSA 0.67 03/05/2008   5. Colon cancer screening - 10/03/13 with 3 year repeat advised due to polyps. Refer today 6. Skin cancer screening- saw in feb/march of this year  Status of chronic or acute concerns   DM- a1c 5.5 on 01/27/17 to be abstracted in from [facility]. On metformin 1g BID Foot exam updated. Refer optometry for updated diabetic eye exam  Reviewed other labs- magnesium was 1.4 - he is on replacement  Very mild anemia on labs in December  Heart transplant- doing well and remains on dual immunosuppressants (mycophenolate and tacrolimus). Heart cath planned 07/2017- may potentially have biopsy.   HTN- well controlled on above meds including lisinopril 40mg , diltiazem 180mg  XR, chlorthalidone 25mg , amlodipine 5mg   Hyperlipidemia- LDL 83 on statin- will continue max dose statin atorvastatin 80mg   GERD- on prilosec 20mg   Hypothyroidism- normal TSH in may labs on supplementation. Levothyroxine 75 mcg  Mild intermittent suprapubic discomfort not at area of hernia- will get UA  Chronic systolic heart failure- no recent lasix required. EF has improved with lisinopril  History of adenomatous polyp of colon 3 year repeat from 2015 per Dr. Deatra Ina. Refer today  4-6 months  Orders Placed This Encounter  Procedures  . Ambulatory referral to Gastroenterology    Referral Priority:   Routine    Referral Type:   Consultation    Referral Reason:   Specialty Services Required    Number of Visits Requested:   1  . Ambulatory referral to Optometry    Referral Priority:   Routine    Referral Type:   V[facility] Transport planner)    Referral Reason:   Specialty Services Required    Requested Specialty:   Optometry    Number of Visits Requested:   1   UA  Return precautions advised.  Garret Reddish, MD

## 2017-02-21 NOTE — Assessment & Plan Note (Signed)
3 year repeat from 2015 per Dr. Deatra Ina. Refer today

## 2017-02-21 NOTE — Addendum Note (Signed)
Addended by: Marin Olp on: 02/21/2017 08:42 AM   Modules accepted: Orders

## 2017-03-18 MED FILL — LEVOTHYROXINE SODIUM/75MCG/TABS: LEVOTHYROXINE SODIUM/75MCG/TABS | 30 days supply | Qty: 30 | Fill #8

## 2017-03-18 MED FILL — CHLORTHALIDONE/25MG/TABS: CHLORTHALIDONE/25MG/TABS | 30 days supply | Qty: 30 | Fill #8

## 2017-03-18 MED FILL — MYCOPHENOLATE MOFETIL/500MG/TABS: MYCOPHENOLATE MOFETIL/500MG/TABS | 30 days supply | Qty: 60 | Fill #4

## 2017-03-18 MED FILL — ATORVASTATIN CALCIUM/80MG/TABS: ATORVASTATIN CALCIUM/80MG/TABS | 30 days supply | Qty: 30 | Fill #3

## 2017-03-18 MED FILL — DILTIAZEM HCL ER/180MG ER/CP24: DILTIAZEM HCL ER/180MG ER/CP24 | 30 days supply | Qty: 60 | Fill #7

## 2017-03-18 MED FILL — TAMSULOSIN/0.4MG/CAP: TAMSULOSIN/0.4MG/CAP | 30 days supply | Qty: 30 | Fill #6

## 2017-03-18 MED FILL — LISINOPRIL/40MG/TABS: LISINOPRIL/40MG/TABS | 30 days supply | Qty: 30 | Fill #10

## 2017-03-18 MED FILL — OMEPRAZOLE/20MG/CAP: OMEPRAZOLE/20MG/CAP | 30 days supply | Qty: 30 | Fill #3

## 2017-03-18 MED FILL — AMLODIPINE/5MG/TABS: AMLODIPINE/5MG/TABS | 30 days supply | Qty: 30 | Fill #3

## 2017-03-18 MED FILL — ACCU-CHEK AVIVA PLUS/STRIPS/: ACCU-CHEK AVIVA PLUS/STRIPS/ | 25 days supply | Qty: 3 | Fill #11

## 2017-03-18 MED FILL — PROGRAF/0.5MG/CAP: PROGRAF/0.5MG/CAP | 30 days supply | Qty: 120 | Fill #11

## 2017-03-19 IMAGING — US US ABDOMEN COMPLETE
1 series · 13 of 25 positions shown · non-contrast
Comparison: Abdominal ultrasound performed 11/09/2007

CLINICAL DATA: Chronic generalized abdominal pain. Initial
encounter.

EXAM:
ABDOMEN ULTRASOUND COMPLETE

[Series 1: us abdomen complete · 0.26mm/px · 13 of 75 slices shown]
[im 1/75]
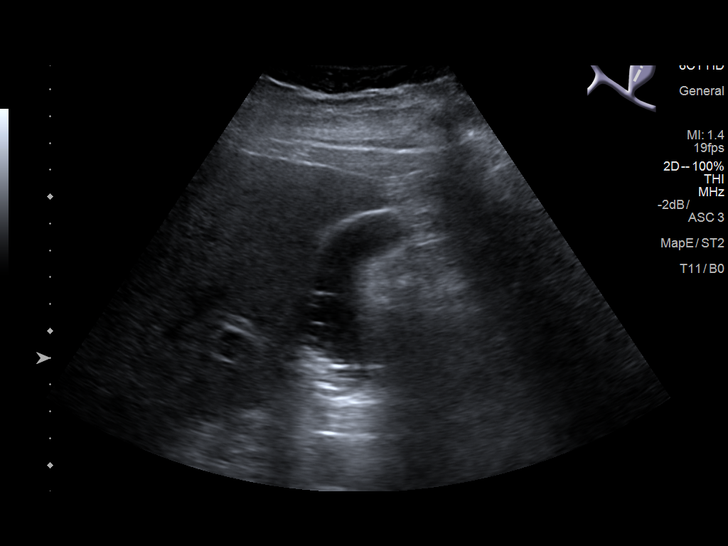
[im 7/75]
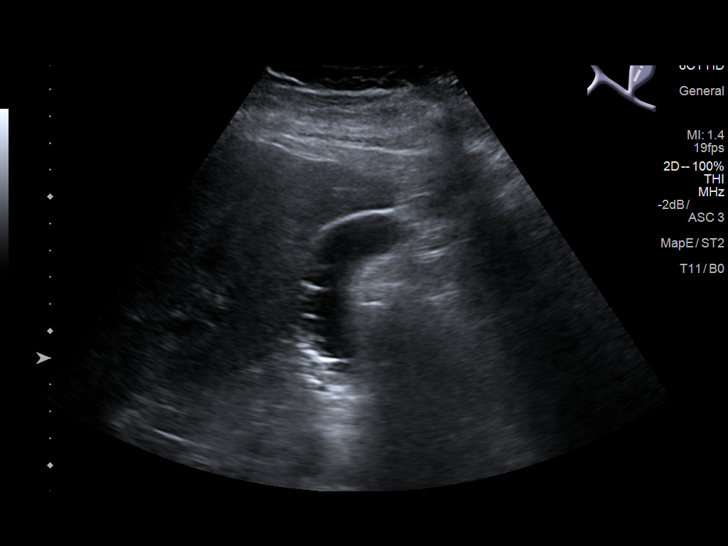
[im 13/75]
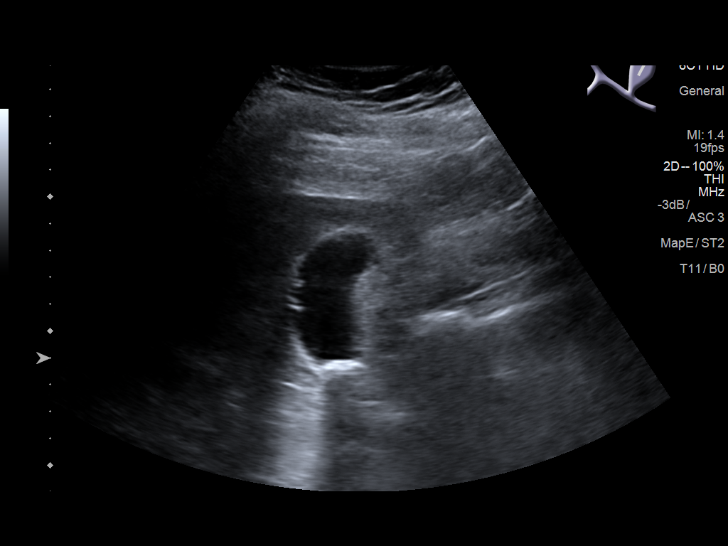
[im 19/75]
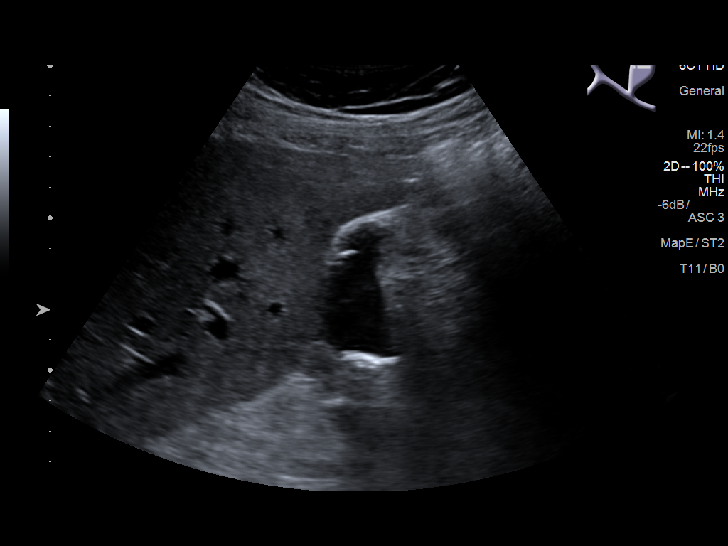
[im 25/75]
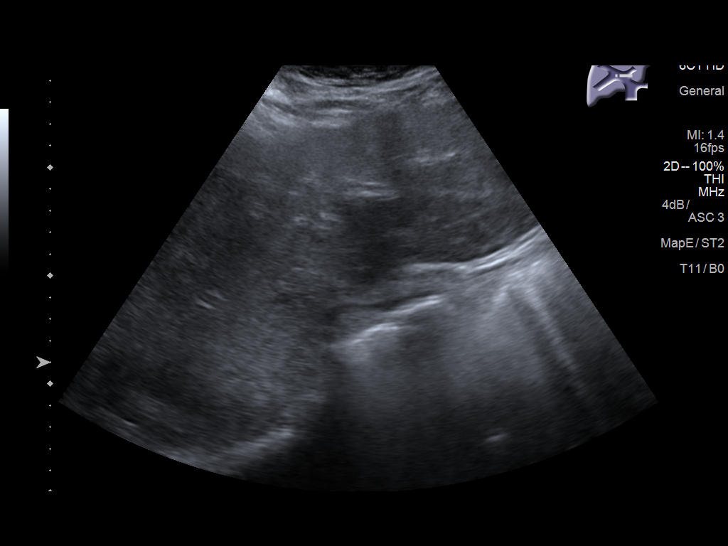
[im 31/75]
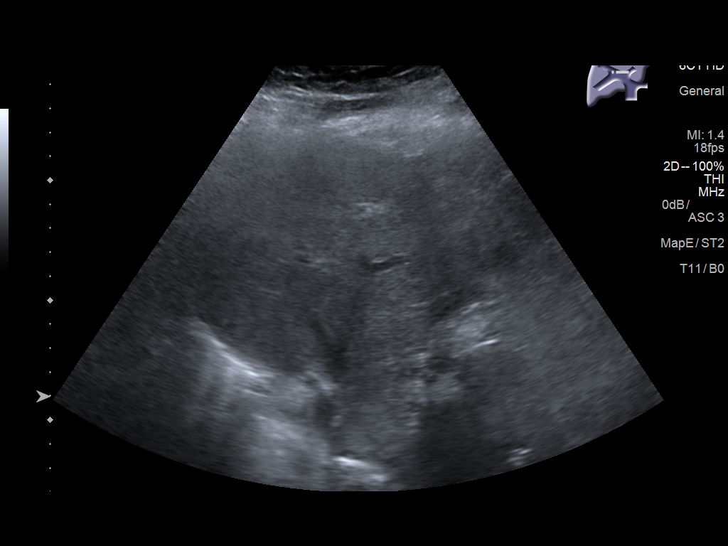
[im 38/75]
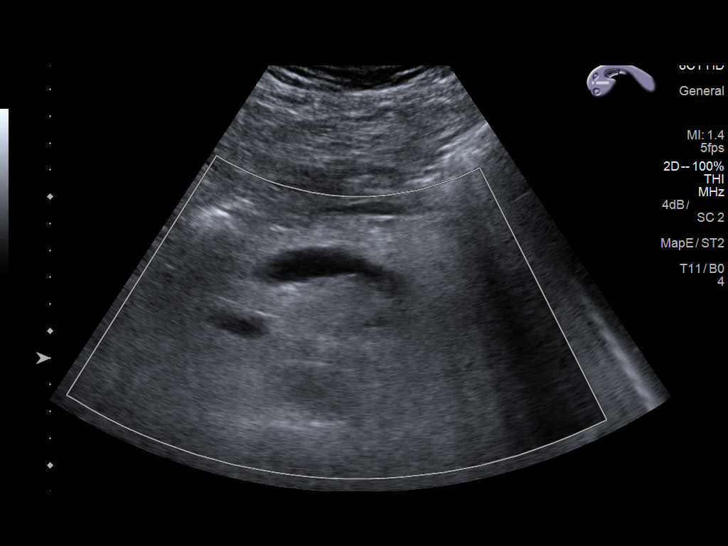
[im 44/75]
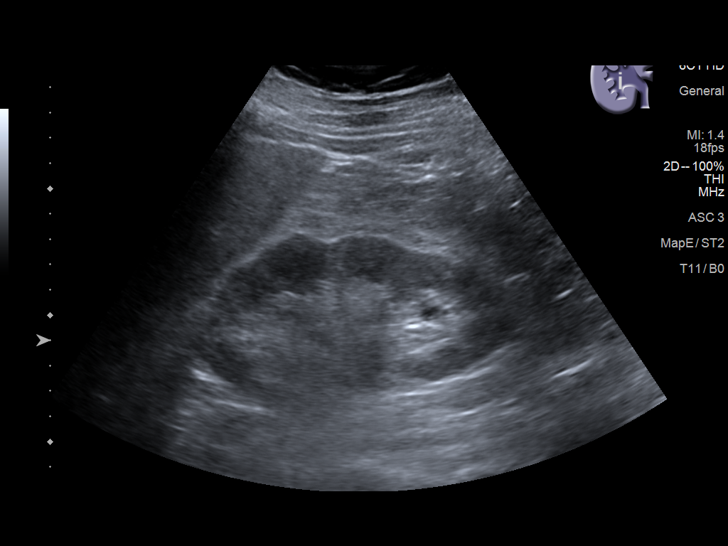
[im 50/75]
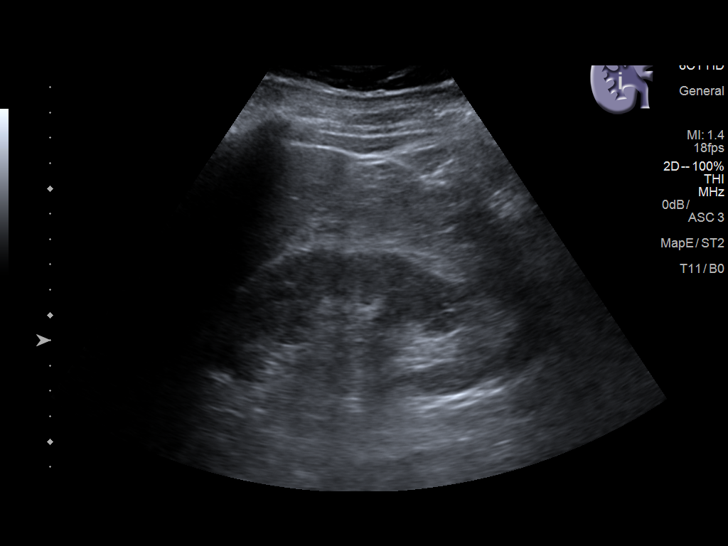
[im 56/75]
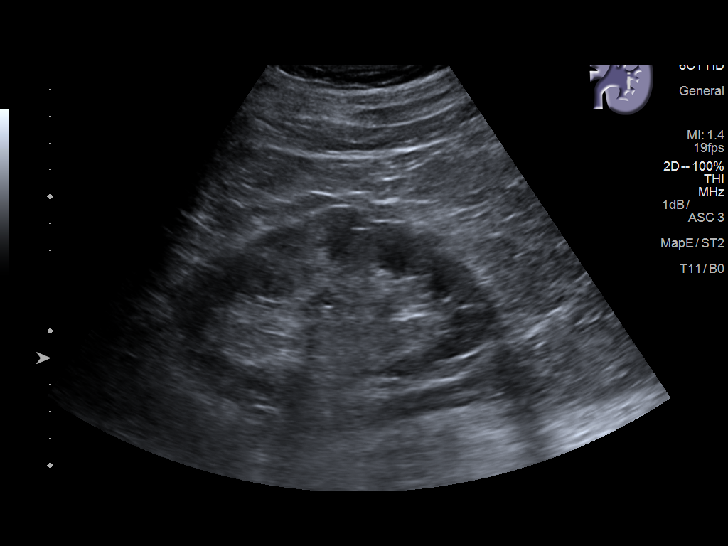
[im 62/75]
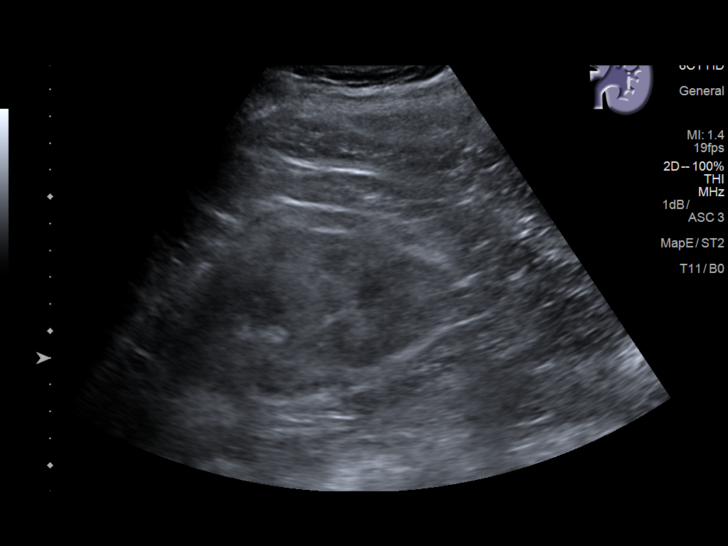
[im 68/75]
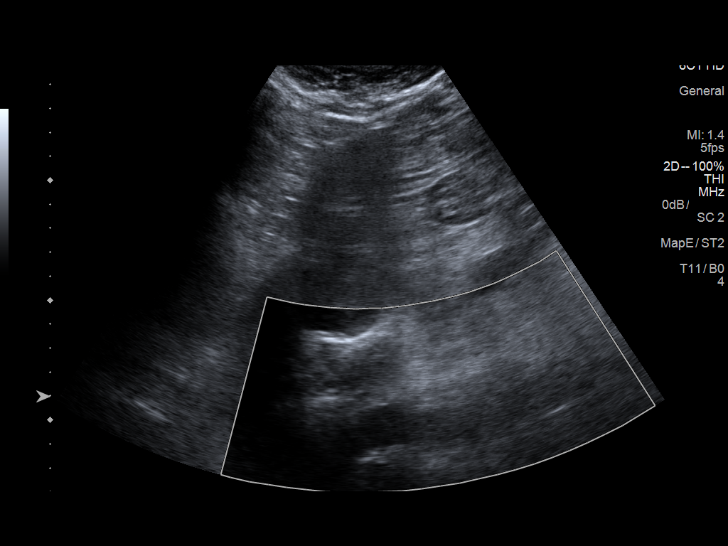
[im 75/75]
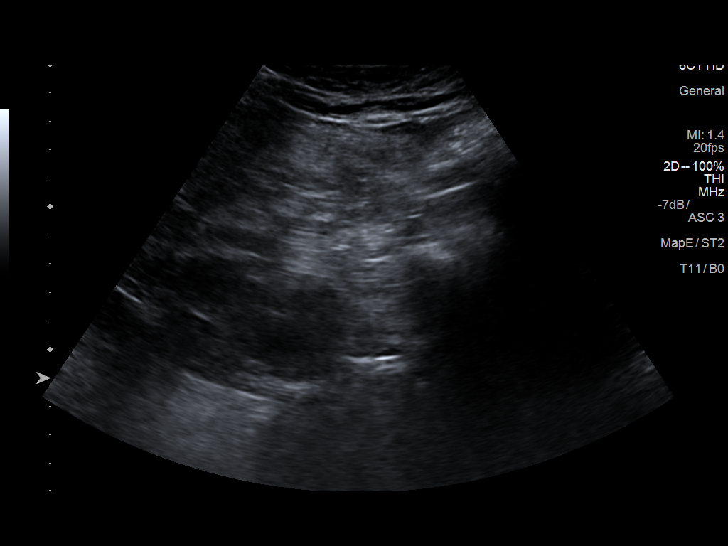

[13 of 25 positions shown; findings below may reference images not displayed]

FINDINGS: Gallbladder: Multiple stones are noted within the gallbladder,
measuring up to 8 mm in size. There also appear to be small polyps,
measuring up to 4 mm in size. No significant gallbladder wall
thickening or pericholecystic fluid is seen. No ultrasonographic
Murphy's sign is elicited.

Common bile duct: Diameter: 0.4 cm, within normal limits in caliber.

Liver: No focal lesion identified. Within normal limits in
parenchymal echogenicity.

IVC: No abnormality visualized.

Pancreas: Visualized portion unremarkable.

Spleen: Size and appearance within normal limits.

Right Kidney: Length: 13.2 cm. Echogenicity within normal limits. A
1.2 cm cyst is noted at the upper pole of the right kidney. No
hydronephrosis visualized.

Left Kidney: Length: 12.8 cm. Echogenicity within normal limits. No
mass or hydronephrosis visualized.

Abdominal aorta: No aneurysm visualized.

Other findings: None.
IMPRESSION: 1. No acute abnormality seen within the abdomen.
2. Cholelithiasis. Small polyps suggested within the gallbladder. No
evidence for obstruction or cholecystitis.
3. Small right renal cyst noted.

## 2017-03-30 ENCOUNTER — Telehealth: Payer: Self-pay | Admitting: *Deleted

## 2017-03-30 NOTE — Telephone Encounter (Signed)
John,  Could you review this pt's hx?  He was here for a colonoscopy in January of 2015.  Since then he has had a heart transplant and has several other medical issues.  Is he ok for [facility]?  Thanks, [employer]

## 2017-04-05 NOTE — Telephone Encounter (Signed)
[name],  This pt is cleared for anesthetic care at [facility].  Thanks,  Cinderella Christoffersen 

## 2017-04-12 MED ORDER — PROGRAF 0.5 MG CAPSULE
ORAL_CAPSULE | Freq: Two times a day (BID) | ORAL | 11 refills | 0 days | Status: CP
Start: 2017-04-12 — End: 2018-01-23

## 2017-04-12 NOTE — Unmapped (Signed)
[facility] Refill Coordination Note  Specialty Medication(s): Prograf  Additional Medications shipped: tamsulosin, test strips, chlorthalidone, amlodipine, lisinopril, levothyroxine, atorvastatin, omeprazole, diltiazem  Patient currently has #70 mycophenolate 500mg , will not send mycophenolate at this time.    Andrew Dickerson, DOB: [birth year]  Phone: [phone] (home) , Alternate phone contact: N/A  Phone or address changes today?: No  All above HIPAA information was verified with patient.  Shipping Address: [address]  [place] [address]   Insurance changes? No    Completed refill call assessment today to schedule patient's medication shipment from the [facility] [phone]).      Confirmed the medication and dosage are correct and have not changed: Yes, regimen is correct and unchanged.    Confirmed patient started or stopped the following medications in the past month:  No, there are no changes reported at this time.    Are you tolerating your medication?:  Andrew Dickerson reports tolerating the medication.    ADHERENCE    Is this medicine transplant or covered by Medicare Part B? Yes.    Prograf 0.5 mg   Quantity filled last month: 120   # of tablets left on hand: 40    Did you miss any doses in the past 4 weeks? No missed doses reported.    FINANCIAL/SHIPPING    Delivery Scheduled: Yes, Expected medication delivery date: 6/14     [name] did not have any additional questions at this time.    Delivery address validated in FSI scheduling system: Yes, address listed in FSI is correct.    We will follow up with patient monthly for standard refill processing and delivery.      Thank you,  Clydell Hakim   [facility] Specialty Pharmacist

## 2017-04-13 MED FILL — CHLORTHALIDONE/25MG/TABS: CHLORTHALIDONE/25MG/TABS | 30 days supply | Qty: 30 | Fill #9

## 2017-04-13 MED FILL — ACCU-CHEK AVIVA PLUS/STRIPS/: ACCU-CHEK AVIVA PLUS/STRIPS/ | 25 days supply | Qty: 3 | Fill #12

## 2017-04-13 MED FILL — AMLODIPINE/5MG/TABS: AMLODIPINE/5MG/TABS | 30 days supply | Qty: 30 | Fill #4

## 2017-04-13 MED FILL — PROGRAF/0.5MG/CAP: PROGRAF/0.5MG/CAP | 30 days supply | Qty: 120 | Fill #0

## 2017-04-13 MED FILL — TAMSULOSIN/0.4MG/CAP: TAMSULOSIN/0.4MG/CAP | 30 days supply | Qty: 30 | Fill #7

## 2017-04-13 MED FILL — LISINOPRIL/40MG/TABS: LISINOPRIL/40MG/TABS | 30 days supply | Qty: 30 | Fill #11

## 2017-04-13 MED FILL — LEVOTHYROXINE SODIUM/75MCG/TABS: LEVOTHYROXINE SODIUM/75MCG/TABS | 30 days supply | Qty: 30 | Fill #9

## 2017-04-18 ENCOUNTER — Ambulatory Visit (AMBULATORY_SURGERY_CENTER): Payer: Self-pay

## 2017-04-18 VITALS — Ht 68.0 in | Wt 239.4 lb

## 2017-04-18 DIAGNOSIS — Z860100 Personal history of colon polyps, unspecified: Secondary | ICD-10-CM

## 2017-04-18 DIAGNOSIS — Z79899 Other long term (current) drug therapy: Secondary | ICD-10-CM | POA: Diagnosis not present

## 2017-04-18 DIAGNOSIS — Z941 Heart transplant status: Secondary | ICD-10-CM | POA: Diagnosis not present

## 2017-04-18 DIAGNOSIS — R7989 Other specified abnormal findings of blood chemistry: Secondary | ICD-10-CM | POA: Diagnosis not present

## 2017-04-18 DIAGNOSIS — Z48298 Encounter for aftercare following other organ transplant: Secondary | ICD-10-CM | POA: Diagnosis not present

## 2017-04-18 MED ORDER — MYCOPHENOLATE MOFETIL 250 MG CAPSULE
Freq: Two times a day (BID) | ORAL | 11 refills | 0.00000 days | Status: CP
Start: 2017-04-18 — End: 2017-04-18

## 2017-04-18 MED ORDER — MYCOPHENOLATE MOFETIL 250 MG CAPSULE: 500 mg | capsule | Freq: Two times a day (BID) | 11 refills | 0 days | Status: AC

## 2017-04-18 MED ORDER — SUPREP BOWEL PREP KIT 17.5-3.13-1.6 GM/177ML PO SOLN: 1.0000 | Freq: Once | ORAL | 0 refills | Status: AC

## 2017-04-18 MED ORDER — MOVIPREP 100 G PO SOLR: 1.0000 | Freq: Once | ORAL | 0 refills | Status: AC

## 2017-04-18 MED FILL — ATORVASTATIN CALCIUM/80MG/TABS: ATORVASTATIN CALCIUM/80MG/TABS | 30 days supply | Qty: 30 | Fill #4

## 2017-04-18 MED FILL — DILTIAZEM HCL ER/180MG ER/CP24: DILTIAZEM HCL ER/180MG ER/CP24 | 30 days supply | Qty: 60 | Fill #8

## 2017-04-18 MED FILL — OMEPRAZOLE/20MG/CAP: OMEPRAZOLE/20MG/CAP | 30 days supply | Qty: 30 | Fill #4

## 2017-04-18 NOTE — Progress Notes (Signed)
No allergies to eggs or soy No diet meds No home oxygen No past problems with anesthesia  Declined emmi 

## 2017-04-18 NOTE — Unmapped (Signed)
[facility] Refill Coordination Note  Specialty Medication(s): Mycophenolate 500mg  tablets ( to be changed to 250mg  capsules)  Additional Medications shipped: none    Andrew Dickerson, DOB: [birth year]  Phone: [phone] (home) , Alternate phone contact: N/A  Phone or address changes today?: No  All above HIPAA information was verified with patient.  Shipping Address: [address]  [place] [address]   Insurance changes? No    Completed refill call assessment today to schedule patient's medication shipment from the [facility] [phone]).      Confirmed the medication and dosage are correct and have not changed: Yes, regimen is correct and unchanged.    Confirmed patient started or stopped the following medications in the past month:  No, there are no changes reported at this time.    Are you tolerating your medication?:  Andrew Dickerson reports tolerating the medication.    ADHERENCE    Mycophenolate Mofetil 500 mg   Quantity filled last month: 60   # of tablets left on hand: 14    Did you miss any doses in the past 4 weeks? No missed doses reported.    FINANCIAL/SHIPPING    Delivery Scheduled: Yes, Expected medication delivery date: 04/21/17 (sending the mycophenolate 250mg  capsules)    Andrew Dickerson did not have any additional questions at this time.    Delivery address validated in FSI scheduling system: Yes, address listed in FSI is correct.    We will follow up with patient monthly for standard refill processing and delivery.      Thank you,  [name]   [facility] Specialty Pharmacist

## 2017-04-20 MED FILL — MYCOPHENOLATE/250 MG/CAPS: MYCOPHENOLATE/250 MG/CAPS | 30 days supply | Qty: 120 | Fill #0

## 2017-04-22 ENCOUNTER — Telehealth: Payer: Self-pay | Admitting: Gastroenterology

## 2017-04-22 NOTE — Telephone Encounter (Signed)
No coupons nor samples available at this time.  Offered Miralax/Dulcolax OTC as an alternative prep.  Pt agreed.  Completed instructions and left with 4th floor receptionist desk. Lan Mcneill/PV

## 2017-04-25 ENCOUNTER — Encounter: Payer: Self-pay | Admitting: Gastroenterology

## 2017-04-25 ENCOUNTER — Ambulatory Visit (AMBULATORY_SURGERY_CENTER): Payer: Medicare HMO | Admitting: Gastroenterology

## 2017-04-25 VITALS — BP 104/61 | HR 73 | Temp 96.4°F | Resp 12 | Ht 68.0 in | Wt 239.0 lb

## 2017-04-25 DIAGNOSIS — D125 Benign neoplasm of sigmoid colon: Secondary | ICD-10-CM

## 2017-04-25 DIAGNOSIS — Z86010 Personal history of colonic polyps: Secondary | ICD-10-CM

## 2017-04-25 DIAGNOSIS — I2510 Atherosclerotic heart disease of native coronary artery without angina pectoris: Secondary | ICD-10-CM | POA: Diagnosis not present

## 2017-04-25 DIAGNOSIS — I252 Old myocardial infarction: Secondary | ICD-10-CM | POA: Diagnosis not present

## 2017-04-25 DIAGNOSIS — I509 Heart failure, unspecified: Secondary | ICD-10-CM | POA: Diagnosis not present

## 2017-04-25 DIAGNOSIS — Z1211 Encounter for screening for malignant neoplasm of colon: Secondary | ICD-10-CM | POA: Diagnosis not present

## 2017-04-25 DIAGNOSIS — Z941 Heart transplant status: Secondary | ICD-10-CM | POA: Diagnosis not present

## 2017-04-25 DIAGNOSIS — K6389 Other specified diseases of intestine: Secondary | ICD-10-CM

## 2017-04-25 DIAGNOSIS — I10 Essential (primary) hypertension: Secondary | ICD-10-CM | POA: Diagnosis not present

## 2017-04-25 MED ORDER — SODIUM CHLORIDE 0.9 % IV SOLN: 500.0000 mL | INTRAVENOUS | Status: DC

## 2017-04-25 NOTE — Progress Notes (Signed)
Called to room to assist during endoscopic procedure.  Patient ID and intended procedure confirmed with present staff. Received instructions for my participation in the procedure from the performing physician.  

## 2017-04-25 NOTE — Progress Notes (Signed)
Pt's states no medical or surgical changes since previsit or office visit. 

## 2017-04-25 NOTE — Patient Instructions (Signed)
Handout given on polyps  YOU HAD AN ENDOSCOPIC PROCEDURE TODAY: Refer to the procedure report and other information in the discharge instructions given to you for any specific questions about what was found during the examination. If this information does not answer your questions, please call [place] office at [phone] to clarify.   YOU SHOULD EXPECT: Some feelings of bloating in the abdomen. Passage of more gas than usual. Walking can help get rid of the air that was put into your GI tract during the procedure and reduce the bloating. If you had a lower endoscopy (such as a colonoscopy or flexible sigmoidoscopy) you may notice spotting of blood in your stool or on the toilet paper. Some abdominal soreness may be present for a day or two, also.  DIET: Your first meal following the procedure should be a light meal and then it is ok to progress to your normal diet. A half-sandwich or bowl of soup is an example of a good first meal. Heavy or fried foods are harder to digest and may make you feel nauseous or bloated. Drink plenty of fluids but you should avoid alcoholic beverages for 24 hours. If you had a esophageal dilation, please see attached instructions for diet.    ACTIVITY: Your care partner should take you home directly after the procedure. You should plan to take it easy, moving slowly for the rest of the day. You can resume normal activity the day after the procedure however YOU SHOULD NOT DRIVE, use power tools, machinery or perform tasks that involve climbing or major physical exertion for 24 hours (because of the sedation medicines used during the test).   SYMPTOMS TO REPORT IMMEDIATELY: A gastroenterologist can be reached at any hour. Please call [phone]  for any of the following symptoms:  Following lower endoscopy (colonoscopy, flexible sigmoidoscopy) Excessive amounts of blood in the stool  Significant tenderness, worsening of abdominal pains  Swelling of the abdomen that is  new, acute  Fever of 100 or higher    FOLLOW UP:  If any biopsies were taken you will be contacted by phone or by letter within the next 1-3 weeks. Call [phone]  if you have not heard about the biopsies in 3 weeks.  Please also call with any specific questions about appointments or follow up tests.  

## 2017-04-25 NOTE — Progress Notes (Signed)
Report given to PACU, vss 

## 2017-04-25 NOTE — Op Note (Signed)
[place] Patient Name: [name] Age Procedure Date: 04/25/2017 11:24 AM MRN: [id number] Endoscopist: [place]. Loletha Carrow , MD Age: 61 Referring MD:  Date of Birth: 18-Jul-1956 Gender: Male Account #: [id number] Procedure:                Colonoscopy Indications:              Surveillance: Personal history of adenomatous                            polyps on last colonoscopy > 3 years ago Medicines:                Monitored Anesthesia Care Procedure:                Pre-Anesthesia Assessment:                           - Prior to the procedure, a History and Physical                            was performed, and patient medications and                            allergies were reviewed. The patient's tolerance of                            previous anesthesia was also reviewed. The risks                            and benefits of the procedure and the sedation                            options and risks were discussed with the patient.                            All questions were answered, and informed consent                            was obtained. Anticoagulants: The patient has taken                            aspirin. It was decided not to withhold this                            medication prior to the procedure. ASA Grade                            Assessment: III - A patient with severe systemic                            disease. After reviewing the risks and benefits,                            the patient was deemed in satisfactory condition to  undergo the procedure.                           After obtaining informed consent, the colonoscope                            was passed under direct vision. Throughout the                            procedure, the patient's blood pressure, pulse, and                            oxygen saturations were monitored continuously. The                            Colonoscope was introduced through the anus  and                            advanced to the the cecum, identified by                            appendiceal orifice and ileocecal valve. The                            colonoscopy was performed with moderate difficulty                            due to significant looping. Successful completion                            of the procedure was aided by using manual                            pressure. The patient tolerated the procedure well.                            The quality of the bowel preparation was good. The                            ileocecal valve, appendiceal orifice, and rectum                            were photographed. The quality of the bowel                            preparation was evaluated using the BBPS [facility]                            Bowel Preparation Scale) with scores of: Right                            Colon = 2, Transverse Colon = 2 and Left Colon = 2.  The total BBPS score equals 6. After lavage. The                            bowel preparation used was MoviPrep and SUPREP. Scope In: 11:31:26 AM Scope Out: 11:52:29 AM Total Procedure Duration: 0 hours 21 minutes 3 seconds  Findings:                 The perianal and digital rectal examinations were                            normal.                           Multiple large-mouthed diverticula were found in                            the left colon.                           A 4 mm polyp was found in the sigmoid colon. The                            polyp was sessile. The polyp was removed with a                            cold snare. Resection and retrieval were complete.                           The exam was otherwise without abnormality on                            direct and retroflexion views. Complications:            No immediate complications. Estimated Blood Loss:     Estimated blood loss: none. Impression:               - Diverticulosis in the left colon.                            - One 4 mm polyp in the sigmoid colon, removed with                            a cold snare. Resected and retrieved.                           - The examination was otherwise normal on direct                            and retroflexion views. Recommendation:           - Patient has a contact number available for                            emergencies. The signs and symptoms of potential  delayed complications were discussed with the                            patient. Return to normal activities tomorrow.                            Written discharge instructions were provided to the                            patient.                           - Resume previous diet.                           - Continue present medications.                           - Await pathology results.                           - Repeat colonoscopy is recommended for                            surveillance. The colonoscopy date will be                            determined after pathology results from today's                            exam become available for review. Carder Yin L. Loletha Carrow, MD 04/25/2017 11:57:02 AM This report has been signed electronically.

## 2017-04-26 ENCOUNTER — Telehealth: Payer: Self-pay

## 2017-04-26 NOTE — Telephone Encounter (Signed)
  Follow up Call-  Call back number 04/25/2017  Post procedure Call Back phone  # [phone]  Permission to leave phone message Yes  Some recent data might be hidden     Patient questions:  Do you have a fever, pain , or abdominal swelling? No. Pain Score  0 *  Have you tolerated food without any problems? Yes.    Have you been able to return to your normal activities? Yes.    Do you have any questions about your discharge instructions: Diet   No. Medications  No. Follow up visit  No.  Do you have questions or concerns about your Care? No.  Actions: * If pain score is 4 or above: No action needed, pain <4.

## 2017-05-02 LAB — HEMATOCRIT: Lab: 40.8

## 2017-05-02 LAB — COMPREHENSIVE METABOLIC PANEL
ALT (SGPT): 13 U/L
AST (SGOT): 10 U/L
BILIRUBIN TOTAL: 0.5 mg/dL
BLOOD UREA NITROGEN: 29 mg/dL — AB (ref 7–25)
CALCIUM: 9.7 mg/dL
CHLORIDE: 106 mmol/L
CO2: 24 mmol/L
CREATININE: 1.25 mg/dL
GLUCOSE RANDOM: 131 mg/dL — AB (ref 65–99)
POTASSIUM: 4.9 mmol/L
PROTEIN TOTAL: 6.8 g/dL

## 2017-05-02 LAB — CBC W/ DIFFERENTIAL
BASOPHILS ABSOLUTE COUNT: 0 10*9/L
BASOPHILS RELATIVE PERCENT: 0.5 %
EOSINOPHILS ABSOLUTE COUNT: 0.1 10*9/L
EOSINOPHILS RELATIVE PERCENT: 1.2 %
HEMATOCRIT: 40.8 %
HEMOGLOBIN: 13.6 g/dL
LYMPHOCYTES ABSOLUTE COUNT: 1.2 10*9/L
LYMPHOCYTES RELATIVE PERCENT: 12.6 %
MEAN CORPUSCULAR HEMOGLOBIN CONC: 33.3 g/dL
MEAN CORPUSCULAR HEMOGLOBIN: 29.2 pg
MEAN CORPUSCULAR VOLUME: 87.7 fL
MEAN PLATELET VOLUME: 10 fL
MONOCYTES ABSOLUTE COUNT: 0.6 10*9/L
MONOCYTES RELATIVE PERCENT: 6.2 %
NEUTROPHILS ABSOLUTE COUNT: 7.7 10*9/L
NEUTROPHILS RELATIVE PERCENT: 79.5 %
PLATELET COUNT: 277 10*9/L
RED BLOOD CELL COUNT: 4.65 10*12/L
RED CELL DISTRIBUTION WIDTH: 13 %
WBC ADJUSTED: 9.7 10*9/L

## 2017-05-02 LAB — ALBUMIN: Lab: 4.7

## 2017-05-02 LAB — MAGNESIUM: Lab: 1.4 — AB

## 2017-05-02 LAB — TACROLIMUS, TROUGH: Lab: 10.1

## 2017-05-02 LAB — CREATININE: Lab: 1.25

## 2017-05-03 ENCOUNTER — Encounter: Payer: Self-pay | Admitting: Gastroenterology

## 2017-05-03 NOTE — Unmapped (Signed)
Lab Results   Component Value Date    TACROLIMUS 10.1 04/18/2017     Goal: Tac: 5-8  Current Dose: 1 mg BID    Lab Results   Component Value Date    BUN 29 (A) 04/18/2017    CREATININE 1.25 04/18/2017    K 4.9 04/18/2017    GLU 131 (A) 04/18/2017    MG 1.4 (A) 04/18/2017     Lab Results   Component Value Date    WBC 9.7 04/18/2017    HGB 13.6 04/18/2017    HCT 40.8 04/18/2017    PLT 277 04/18/2017    NEUTROABS 7.7 04/18/2017    EOSABS 0.1 04/18/2017       DISCUSSED WITH:   Nyra Market, NP  Plan: Make No Changes  Next Labs: 3 Months    Emailed Andrew Dickerson to notify him his labs look unremarkable, labs again in 3 months.  Andrew Dickerson verbalized understanding & agreed with the plan.

## 2017-05-03 NOTE — Unmapped (Signed)
PT HAS ENOUGH AND WANTS TO SYNC ALL MEDS - CALL 9/4

## 2017-05-10 MED ORDER — LISINOPRIL 40 MG TABLET
ORAL_TABLET | Freq: Every day | ORAL | 11 refills | 0.00000 days | Status: CP
Start: 2017-05-10 — End: 2017-05-10

## 2017-05-10 MED ORDER — LISINOPRIL 40 MG TABLET: 40 mg | tablet | Freq: Every day | 11 refills | 0 days | Status: AC

## 2017-05-10 MED ORDER — LISINOPRIL 40 MG TABLET: tablet | 11 refills | 0 days

## 2017-05-10 NOTE — Unmapped (Signed)
[facility] Refill and Clinical Coordination Note  Medication(s): PROGRAF 0.5, MYCOPHENOLATE 250, AMLODIPINE, DILTIAZEM, LISINOPRIL, LEVOTHYROXINE, TAMSULOSIN, ATORVASTATIN, CHLORTHALIDONE, OMEPRAZOLE, STRIPS    Andrew Dickerson, DOB: 03-03-1956  Phone: [phone] (home) , Alternate phone contact: N/A  Shipping address: [address]  [name] [address]  Phone or address changes today?: No  All above HIPAA information verified.  Insurance changes? No    Completed refill and clinical call assessment today to schedule patient's medication shipment from the [facility] [phone]).      MEDICATION RECONCILIATION    Confirmed the medication and dosage are correct and have not changed: Yes, regimen is correct and unchanged.    Were there any changes to your medication(s) in the past month:  No, there are no changes reported at this time.    ADHERENCE    Is this medicine transplant or covered by Medicare Part B? Yes.    Prograf 0.5 mg   Quantity filled last month: 120   # of tablets left on hand: 40      Mycophenolate Mofetil 250 mg   Quantity filled last month: 120   # of tablets left on hand: 40    Did you miss any doses in the past 4 weeks? No missed doses reported.  Adherence counseling provided? Not needed     SIDE EFFECT MANAGEMENT    Are you tolerating your medication?:  Andrew Dickerson reports tolerating the medication.  Side effect management discussed: None      Therapy is appropriate and should be continued.    Evidence of clinical benefit: See Epic note from 02/21/17      FINANCIAL/SHIPPING    Delivery Scheduled: Yes, Expected medication delivery date: 05/17/17   Additional medications refilled: No additional medications/refills needed at this time.    [name] did not have any additional questions at this time.    Delivery address validated in FSI scheduling system: Yes, address listed above is correct.      We will follow up with patient monthly for standard refill processing and delivery.      Thank you,  Mickle Mallory   [facility] Specialty Pharmacist

## 2017-05-15 MED FILL — AMLODIPINE/5MG/TABS: AMLODIPINE/5MG/TABS | 30 days supply | Qty: 30 | Fill #5

## 2017-05-15 MED FILL — PROGRAF/0.5MG/CAP: PROGRAF/0.5MG/CAP | 30 days supply | Qty: 120 | Fill #1

## 2017-05-15 MED FILL — LISINOPRIL/40MG/TABS: LISINOPRIL/40MG/TABS | 30 days supply | Qty: 30 | Fill #0

## 2017-05-15 MED FILL — MYCOPHENOLATE MOFETIL/250MG/CAPS: MYCOPHENOLATE MOFETIL/250MG/CAPS | 30 days supply | Qty: 120 | Fill #1

## 2017-05-15 MED FILL — CHLORTHALIDONE/25MG/TABS: CHLORTHALIDONE/25MG/TABS | 30 days supply | Qty: 30 | Fill #10

## 2017-05-15 MED FILL — ATORVASTATIN CALCIUM/80MG/TABS: ATORVASTATIN CALCIUM/80MG/TABS | 30 days supply | Qty: 30 | Fill #5

## 2017-05-15 MED FILL — OMEPRAZOLE/20MG/CPDR: OMEPRAZOLE/20MG/CPDR | 30 days supply | Qty: 30 | Fill #5

## 2017-05-15 MED FILL — LEVOTHYROXINE SODIUM/75MCG/TABS: LEVOTHYROXINE SODIUM/75MCG/TABS | 30 days supply | Qty: 30 | Fill #10

## 2017-05-15 MED FILL — DILTIAZEM HCL ER/180MG ER/CP24: DILTIAZEM HCL ER/180MG ER/CP24 | 30 days supply | Qty: 60 | Fill #9

## 2017-05-15 MED FILL — TAMSULOSIN/0.4MG/CAP: TAMSULOSIN/0.4MG/CAP | 30 days supply | Qty: 30 | Fill #8

## 2017-05-17 MED ORDER — BLOOD SUGAR DIAGNOSTIC STRIPS
ORAL_STRIP | 11 refills | 0 days | Status: CP
Start: 2017-05-17 — End: 2019-01-02

## 2017-05-17 MED ORDER — ACCU-CHEK AVIVA PLUS TEST STRIPS
11 refills | 0 days | Status: CP
Start: 2017-05-17 — End: 2017-05-17

## 2017-05-17 MED FILL — ACCU-CHEK AVIVA PLUS/STRIPS/STRP: ACCU-CHEK AVIVA PLUS/STRIPS/STRP | 33 days supply | Qty: 4 | Fill #0

## 2017-06-06 NOTE — Unmapped (Signed)
[facility] Refill Coordination Note  Specialty Medication(s): PROGRAF 0.5, MYCOPHENOLATE 500, AMLODIPINE, DILTIAZEM, LISINOPRIL, LEVOTHYROXINE, TAMSULOSIN, ATORVASTATIN, CHLORTHALIDONE, OMEPRAZOLE    Andrew Dickerson, DOB: 1955-09-25  Phone: [phone] (home) , Alternate phone contact: N/A  Phone or address changes today?: No  All above HIPAA information was verified with patient.  Shipping Address: [address]  [place] [address]   Insurance changes? No    Completed refill call assessment today to schedule patient's medication shipment from the [facility] [phone]).      Confirmed the medication and dosage are correct and have not changed: Yes, regimen is correct and unchanged.    Confirmed patient started or stopped the following medications in the past month:  No, there are no changes reported at this time.    Are you tolerating your medication?:  Andrew Dickerson reports tolerating the medication.    ADHERENCE    Prograf 0.5 mg   Quantity filled last month: 120   # of tablets left on hand: 40      Mycophenolate Mofetil 500 mg   Quantity filled last month: 60   # of tablets left on hand: 20    Did you miss any doses in the past 4 weeks? No missed doses reported.    FINANCIAL/SHIPPING    Delivery Scheduled: Yes, Expected medication delivery date: 06/14/17     Andrew Dickerson did not have any additional questions at this time.    Delivery address validated in FSI scheduling system: Yes, address listed in FSI is correct.    We will follow up with patient monthly for standard refill processing and delivery.      Thank you,  Mickle Mallory   [facility] Specialty Pharmacist

## 2017-06-12 MED FILL — LISINOPRIL/40MG/TABS: LISINOPRIL/40MG/TABS | 30 days supply | Qty: 30 | Fill #1

## 2017-06-12 MED FILL — AMLODIPINE/5MG/TABS: AMLODIPINE/5MG/TABS | 30 days supply | Qty: 30 | Fill #6

## 2017-06-12 MED FILL — MYCOPHENOLATE MOFETIL/500MG/TABS: MYCOPHENOLATE MOFETIL/500MG/TABS | 30 days supply | Qty: 60 | Fill #5

## 2017-06-12 MED FILL — PROGRAF/0.5MG/CAP: PROGRAF/0.5MG/CAP | 30 days supply | Qty: 120 | Fill #2

## 2017-06-12 MED FILL — TAMSULOSIN/0.4MG/CAP: TAMSULOSIN/0.4MG/CAP | 30 days supply | Qty: 30 | Fill #9

## 2017-06-12 MED FILL — CHLORTHALIDONE/25MG/TABS: CHLORTHALIDONE/25MG/TABS | 30 days supply | Qty: 30 | Fill #11

## 2017-06-12 MED FILL — ATORVASTATIN CALCIUM/80MG/TABS: ATORVASTATIN CALCIUM/80MG/TABS | 30 days supply | Qty: 30 | Fill #6

## 2017-06-12 MED FILL — LEVOTHYROXINE SODIUM/75MCG/TABS: LEVOTHYROXINE SODIUM/75MCG/TABS | 30 days supply | Qty: 30 | Fill #11

## 2017-06-12 MED FILL — OMEPRAZOLE/20MG/CPDR: OMEPRAZOLE/20MG/CPDR | 30 days supply | Qty: 30 | Fill #6

## 2017-06-12 MED FILL — DILTIAZEM HCL ER/180MG ER/CP24: DILTIAZEM HCL ER/180MG ER/CP24 | 30 days supply | Qty: 60 | Fill #10

## 2017-07-08 NOTE — Unmapped (Signed)
[facility] Pharm[facility] Refill Coordination Note  Specialty Medication(s): Prograf 0.5mg  & Mycophenolate 500mg    Additional Medications shipped: Lisinopril 40mg , Diltiazem 180mg , Amlodipine 5mg , Chlorthadone 25mg , Tamsulosion 0.4mg , Atorvastatin 80mg , Levothyroxine , & Omeprazole 20mg     Andrew Dickerson, DOB: [birth year]  Phone: [phone] (home) , Alternate phone contact: N/A  Phone or address changes today?: No  All above HIPAA information was verified with patient.  Shipping Address: [address]  [place] [address]   Insurance changes? No    Completed refill call assessment today to schedule patient's medication shipment from the [facility] [phone]).      Confirmed the medication and dosage are correct and have not changed: Yes, regimen is correct and unchanged.    Confirmed patient started or stopped the following medications in the past month:  No, there are no changes reported at this time.    Are you tolerating your medication?:  Andrew Dickerson reports tolerating the medication.    ADHERENCE    (Below is required for Medicare Part B or Transplant patients only - per drug):   How many tablets were dispensed last month:   Mycophenolate Mofetil 500 mg   Quantity filled last month: 60   # of tablets left on hand: 10 Days  Prograf 0.5 mg   Quantity filled last month: 120   # of tablets left on hand: 10 days    Did you miss any doses in the past 4 weeks? No missed doses reported.    FINANCIAL/SHIPPING    Delivery Scheduled: Yes, Expected medication delivery date: 07/15/2017     Andrew Dickerson did not have any additional questions at this time.    Delivery address validated in FSI scheduling system: Yes, address listed in FSI is correct.    We will follow up with patient monthly for standard refill processing and delivery.      Thank you,  Tamala Fothergill   [facility] Shared [facility] Specialty Technician

## 2017-07-12 MED ORDER — CHLORTHALIDONE 25 MG TABLET: 25 mg | each | 11 refills | 0 days

## 2017-07-12 MED ORDER — CHLORTHALIDONE 25 MG TABLET
Freq: Every day | ORAL | 11 refills | 0.00000 days | Status: CP
Start: 2017-07-12 — End: 2018-07-10

## 2017-07-12 MED ORDER — LEVOTHYROXINE 75 MCG TABLET
ORAL_TABLET | Freq: Every day | ORAL | 11 refills | 0.00000 days | Status: CP
Start: 2017-07-12 — End: 2018-07-10

## 2017-07-12 MED ORDER — LEVOTHYROXINE 75 MCG TABLET: tablet | 11 refills | 0 days

## 2017-07-12 MED ORDER — LEVOTHYROXINE 75 MCG TABLET: 75 ug | tablet | Freq: Every day | 11 refills | 0 days | Status: AC

## 2017-07-14 MED FILL — PROGRAF/0.5MG/CAP: PROGRAF/0.5MG/CAP | 30 days supply | Qty: 120 | Fill #3

## 2017-07-14 MED FILL — AMLODIPINE/5MG/TABS: AMLODIPINE/5MG/TABS | 30 days supply | Qty: 30 | Fill #7

## 2017-07-14 MED FILL — CHLORTHALIDONE/25MG/TABS: CHLORTHALIDONE/25MG/TABS | 30 days supply | Qty: 30 | Fill #0

## 2017-07-14 MED FILL — MYCOPHENOLATE/500MG/TABS: MYCOPHENOLATE/500MG/TABS | 30 days supply | Qty: 60 | Fill #6

## 2017-07-14 MED FILL — TAMSULOSIN/0.4MG/CAP: TAMSULOSIN/0.4MG/CAP | 30 days supply | Qty: 30 | Fill #10

## 2017-07-14 MED FILL — LEVOTHYROXINE SODIUM/75MCG/TABS: LEVOTHYROXINE SODIUM/75MCG/TABS | 30 days supply | Qty: 30 | Fill #0

## 2017-07-14 MED FILL — OMEPRAZOLE/20MG/CPDR: OMEPRAZOLE/20MG/CPDR | 30 days supply | Qty: 30 | Fill #7

## 2017-07-14 MED FILL — ATORVASTATIN CALCIUM/80MG/TABS: ATORVASTATIN CALCIUM/80MG/TABS | 30 days supply | Qty: 30 | Fill #7

## 2017-07-14 MED FILL — DILTIAZEM HCL ER/180MG ER/CP24: DILTIAZEM HCL ER/180MG ER/CP24 | 30 days supply | Qty: 60 | Fill #11

## 2017-07-14 MED FILL — LISINOPRIL/40MG/TABS: LISINOPRIL/40MG/TABS | 30 days supply | Qty: 30 | Fill #2

## 2017-07-18 DIAGNOSIS — Z941 Heart transplant status: Secondary | ICD-10-CM | POA: Diagnosis not present

## 2017-07-18 DIAGNOSIS — Z125 Encounter for screening for malignant neoplasm of prostate: Secondary | ICD-10-CM | POA: Diagnosis not present

## 2017-07-18 DIAGNOSIS — E559 Vitamin D deficiency, unspecified: Secondary | ICD-10-CM | POA: Diagnosis not present

## 2017-07-18 DIAGNOSIS — E785 Hyperlipidemia, unspecified: Secondary | ICD-10-CM | POA: Diagnosis not present

## 2017-07-18 DIAGNOSIS — Z79899 Other long term (current) drug therapy: Secondary | ICD-10-CM | POA: Diagnosis not present

## 2017-07-18 DIAGNOSIS — R7989 Other specified abnormal findings of blood chemistry: Secondary | ICD-10-CM | POA: Diagnosis not present

## 2017-07-18 DIAGNOSIS — Z48298 Encounter for aftercare following other organ transplant: Secondary | ICD-10-CM | POA: Diagnosis not present

## 2017-07-21 LAB — MAGNESIUM: Lab: 1.5

## 2017-07-21 LAB — COMPREHENSIVE METABOLIC PANEL
ALKALINE PHOSPHATASE: 61 U/L
ALT (SGPT): 9 U/L
AST (SGOT): 8 U/L — AB (ref 10–35)
BILIRUBIN TOTAL: 0.5 mg/dL
BLOOD UREA NITROGEN: 24 mg/dL
CALCIUM: 9.5 mg/dL
CHLORIDE: 105 mmol/L
CO2: 22 mmol/L
CREATININE: 1.21 mg/dL
EGFR MDRD NON AF AMER: 65 mL/min/{1.73_m2}
GLUCOSE RANDOM: 119 mg/dL — AB (ref 65–99)
SODIUM: 136 mmol/L

## 2017-07-21 LAB — LIPID PANEL
CHOLESTEROL/HDL RATIO SCREEN: 6.4 — AB (ref <=5.0)
CHOLESTEROL: 218 mg/dL — AB (ref <=200)
NON-HDL CHOLESTEROL: 184 mg/dL — AB (ref <=130)

## 2017-07-21 LAB — CBC W/ DIFFERENTIAL
BASOPHILS ABSOLUTE COUNT: 0 10*9/L
BASOPHILS RELATIVE PERCENT: 0.6 %
EOSINOPHILS RELATIVE PERCENT: 1.4 %
HEMATOCRIT: 42.7 %
HEMOGLOBIN: 14.4 g/dL
LYMPHOCYTES ABSOLUTE COUNT: 1 10*9/L
LYMPHOCYTES RELATIVE PERCENT: 12.7 %
MEAN CORPUSCULAR HEMOGLOBIN CONC: 33.7 g/dL
MEAN CORPUSCULAR HEMOGLOBIN: 29.8 pg
MEAN CORPUSCULAR VOLUME: 88.4 fL
MEAN PLATELET VOLUME: 10.2 fL
MONOCYTES ABSOLUTE COUNT: 0.4 10*9/L
NEUTROPHILS ABSOLUTE COUNT: 6.4 10*9/L
NEUTROPHILS RELATIVE PERCENT: 79.8 %
PLATELET COUNT: 292 10*9/L
RED BLOOD CELL COUNT: 4.83 10*12/L
RED CELL DISTRIBUTION WIDTH: 12.9 %
WBC ADJUSTED: 8 10*9/L

## 2017-07-21 LAB — ESTIMATED AVERAGE GLUCOSE: Lab: 0

## 2017-07-21 LAB — PHOSPHORUS
Lab: 2.6
PHOSPHORUS: 2.6 mg/dL

## 2017-07-21 LAB — THYROID STIMULATING HORMONE: Lab: 1.13

## 2017-07-21 LAB — TACROLIMUS, TROUGH: Lab: 9.6

## 2017-07-21 LAB — HEMOGLOBIN A1C: HEMOGLOBIN A1C: 5.6

## 2017-07-21 LAB — VIT D2 (25OH): Lab: 0

## 2017-07-21 LAB — BILIRUBIN TOTAL: Lab: 0.5

## 2017-07-21 LAB — ALBUMIN: Lab: 4.6

## 2017-07-21 LAB — MEAN PLATELET VOLUME: Lab: 10.2

## 2017-07-21 LAB — TRIGLYCERIDES: Lab: 168 — AB

## 2017-07-22 NOTE — Unmapped (Signed)
Spoke with Andrew Dickerson regarding his lab results. His labs were drawn in anticipation for his cath, his lipid panel came back quite elevated from previous. Andrew Dickerson says he is taking his statin daily and taking the prescribed dose. He does say that he has been on a low card high fat diet which is likely contributing to his elevated lipid panel, will discuss with Andrew Market, Andrew Dickerson and discuss with patient further at time of cath. Andrew Dickerson verbalized understanding & agreed with the plan.

## 2017-07-26 ENCOUNTER — Ambulatory Visit
Admission: RE | Admit: 2017-07-26 | Discharge: 2017-07-26 | Disposition: A | Discharge status: Home / Self Care | Payer: MEDICARE

## 2017-07-26 ENCOUNTER — Ambulatory Visit
Admission: RE | Admit: 2017-07-26 | Discharge: 2017-07-26 | Disposition: A | Discharge status: Home / Self Care | Payer: MEDICARE | Attending: Orthopaedic Surgery | Admitting: Orthopaedic Surgery

## 2017-07-26 DIAGNOSIS — Z941 Heart transplant status: Principal | ICD-10-CM

## 2017-07-26 DIAGNOSIS — Z4821 Encounter for aftercare following heart transplant: Principal | ICD-10-CM

## 2017-07-26 DIAGNOSIS — I499 Cardiac arrhythmia, unspecified: Secondary | ICD-10-CM | POA: Diagnosis not present

## 2017-07-26 DIAGNOSIS — E119 Type 2 diabetes mellitus without complications: Secondary | ICD-10-CM | POA: Diagnosis not present

## 2017-07-26 DIAGNOSIS — E039 Hypothyroidism, unspecified: Secondary | ICD-10-CM | POA: Diagnosis not present

## 2017-07-26 DIAGNOSIS — Z87891 Personal history of nicotine dependence: Secondary | ICD-10-CM | POA: Diagnosis not present

## 2017-07-26 DIAGNOSIS — Z7982 Long term (current) use of aspirin: Secondary | ICD-10-CM | POA: Diagnosis not present

## 2017-07-26 DIAGNOSIS — E782 Mixed hyperlipidemia: Secondary | ICD-10-CM | POA: Diagnosis not present

## 2017-07-26 DIAGNOSIS — E669 Obesity, unspecified: Secondary | ICD-10-CM | POA: Diagnosis not present

## 2017-07-26 NOTE — Unmapped (Signed)
Cardiac Catheterization Laboratory  [facility] [place], [place]  Tel: [phone]     Fax: [phone]    PRELIMINARY CARDIAC CATHETERIZATION REPORT  (Full Report to Follow within 72 hours)  ____________________________________________________________________________  PCP:  Tana Conch, MD  Phone:  None  Fax:  [phone]    Referring Physicians:  Rosana Hoes, Md  [address]  [id number]  Dept Of Med  [place], [place] [address]     Indication:  Andrew Dickerson is a 61 y.o. male with prior orthotopic heart transplant who was referred for cardiac catheterization for evaluation.       Findings:  1. No angiographically apparent coronary artery disease.      Recommendations:  Medical Management  ?? TR Band Protocol  ?? Aggressive secondary prevention  ?? ASA 81 mg daily indefinitely          ____________________________________________________________________________     Performing Attending:  Janey Genta, MD, MPH  Diagnostic Fellow:  Aundra Dubin, MD  Procedures performed: Coronary angiography  Arterial Access Site:   Right Radial Artery  Arterial Closure:  TR Band  Venous Access Site:   None  Contrast Volume:  40 cc    Coronary Angiography   Aortic Pressure: 104/60 mmHg    Coronary arteries:  Dominance: Right  Left Main: 0%  Left Anterior Descending: 0%  Left Circumflex: 0%  Right Coronary: 0%       Jacqulyne Gladue A. Hessie Dibble, MD, MPH  Assistant Professor of Medicine  [place] [facility] [place] [facility]  [place], [place]

## 2017-07-26 NOTE — Unmapped (Signed)
Cardiac Catheterization Laboratory  [facility] [place], [place]  Tel: [phone]   Fax: [phone]    PRE-PROCEDURE H&P    Assessment and Plan     Andrew Dickerson is a 61 y.o. year old male with presents for left heart catheterization.    Risks and benefits of the procedure were discussed with the patient. Informed consent was obtained.    We will proceed with the procedure, left heart catheterization, as planned.    Dispo: Plan to discharge the patient post-procedure  Code Status verified and updated in EPIC.    History     History of Present Illness    Andrew Dickerson is a 61 y.o. year old male with history of ischemic cardiomyopathy s/p heart transplant in 2013 who presents for yearly left heart catheterization to investigate for allograft vasculopathy. He was last seen in the heart failure clinic in May and reports to be doing very well since then. At that visit, his TSH were also negative, [place] is being drawn today. He denies chest pain or shortness of breath, worsening lower extremity edema or decreased exercise tolerance. He has not had any trouble taking his medications.      Medications Received  Medications   aspirin tablet 325 mg (not administered)       Review of Systems  10 Systems reviewed and negative other than noted above.    Medical History Past Medical History:   Diagnosis Date   ??? Coronary atherosclerosis of native coronary artery 04/21/2009    12/92: CABG x 5 with LIMA to LAD and saphenous grafts to diagonal 1, obtuse marginal, distal circ and distal PDA 8/00: Repeat CABG x 3 with redo free left radial to distal LAD, saphenous to RCA, obtuse margina 2005: multiple stents placed to vein grafts and native arteries    ??? DM (diabetes mellitus) (CMS-HCC) 12/27/2012   ??? Hypothyroidism 05/07/2011   ??? LVAD (left ventricular assist device) present (CMS-HCC) 12/27/2012    05/09/09 received heart mate II    ??? Mixed hyperlipidemia 05/07/2011   ??? Obesity 12/27/2012     Past Surgical History:   Procedure Laterality Date   ??? HEART TRANSPLANT  07/18/12    CMV D-/R-   ??? LEFT VENTRICULAR ASSIST DEVICE  05/09/09   ??? PR BIOPSY OF HEART LINING N/A 09/04/2015    Procedure: Left/Right Heart Catheterization W Biopsy;  Surgeon: Lesle Reek, MD;  Location: [facility] CATH;  Service: Cardiology   ??? PR CATH PLACE/CORON ANGIO, IMG SUPER/INTERP,R&L HRT CATH, L HRT VENTRIC N/A 08/24/2016    Procedure: Left/Right Heart Catheterization W Intervention;  Surgeon: Lesle Reek, MD;  Location: [facility] CATH;  Service: Cardiology      Home Medications  Prescriptions Prior to Admission   Medication Sig Dispense Refill Last Dose   ??? amLODIPine (NORVASC) 5 MG tablet Take 1 tablet (5 mg total) by mouth daily. 30 tablet 11 07/25/2017 at Unknown time   ??? aspirin (ADULT LOW DOSE ASPIRIN) 81 MG tablet Take 81 mg by mouth daily.   07/25/2017 at Unknown time   ??? atorvastatin (LIPITOR) 80 MG tablet Take 1 tablet (80 mg total) by mouth daily. 30 tablet 11 07/25/2017 at Unknown time   ??? chlorthalidone (HYGROTON) 25 MG tablet Take 1 tablet (25 mg total) by mouth daily. 30 tablet 11 07/25/2017 at Unknown time   ??? cholecalciferol, vitamin D3, 1,000 unit capsule Take 3,000 Units by mouth daily.   07/25/2017 at Unknown  time   ??? diltiazem (CARDIZEM CD) 180 MG 24 hr capsule Take 2 capsules (360 mg total) by mouth daily. 60 capsule 11 07/25/2017 at Unknown time   ??? levothyroxine (SYNTHROID, LEVOTHROID) 75 MCG tablet Take 1 tablet (75 mcg total) by mouth daily. 30 tablet 11 07/25/2017 at Unknown time   ??? lisinopril (PRINIVIL,ZESTRIL) 40 MG tablet Take 1 tablet (40 mg total) by mouth daily. 30 tablet 11 07/25/2017 at Unknown time   ??? magnesium oxide 500 mg Tab Take 500 mg by mouth daily with breakfast.    07/25/2017 at Unknown time   ??? metFORMIN (GLUCOPHAGE) 1000 MG tablet Take 1,000 mg by mouth Two (2) times a day.   Past Week at Unknown time   ??? mycophenolate (CELLCEPT) 250 mg capsule Take 2 capsules (500 mg total) by mouth Two (2) times a day. 120 capsule 11 07/25/2017 at Unknown time   ??? omeprazole (PRILOSEC) 20 MG capsule Take 1 capsule (20 mg total) by mouth daily. 30 capsule 11 07/25/2017 at Unknown time   ??? PROGRAF 0.5 mg capsule Take 2 capsules (1 mg total) by mouth Two (2) times a day. 120 capsule 11 07/25/2017 at Unknown time   ??? tamsulosin (FLOMAX) 0.4 mg capsule Take 1 capsule (0.4 mg total) by mouth daily. 30 capsule 11 07/25/2017 at Unknown time   ??? ACCU-CHEK AVIVA PLUS TEST STRP Strp CHECK BLOOD SUGAR BEFORE MEALS, AT BEDTIME, & AS NEEDED FOR HYPERGLYCEMIA, UP TO 6 TIMES A DAY 200 each 11    ??? blood-glucose meter kit Please use to check blood sugars before meals & at bedtime. 1 each 0       Allergies has No Known Allergies.   Family History family history includes Heart disease in his maternal grandfather.   Social History Alcohol use:  reports that he does not drink alcohol.  Tobacco use:  reports that he quit smoking about 26 years ago. He has a 20.00 pack-year smoking history. He has never used smokeless tobacco.  Drug use:  reports that he does not use drugs.     Objective     Heart Rate:  [83] 83  Resp:  [16] 16  BP: (129)/(71) 129/71  SpO2:  [100 %] 100 %  BMI (Calculated):  [34.8] 34.8  Wt Readings from Last 4 Encounters:   07/26/17 (!) 103.9 kg (229 lb)   02/01/17 (!) 105.3 kg (232 lb 3.2 oz)   08/24/16 (!) 101.6 kg (224 lb)   02/10/16 99.3 kg (218 lb 14.4 oz)     Physical Exam  GEN: NAD, resting in stretcher  HEENT: EOMI, sclera anicteric, conjunctiva noninjected  Respiratory: Normal WOB, CTABL  Cardiovascular: RRR, no m/r/g, JVP <5cm  Abdominal: Soft, NT, ND  Extremities: Warm, well perfused, no cyanosis, no edema, pulses 2+ radial (R) and femoral  Neuro: CN II - XII grossly intact  Psych: appropriate, normal affect    Recent laboratory values, ECG, and imaging reviewed prior to procedure.

## 2017-07-31 LAB — ALLOMAP SCORE: Lab: 34

## 2017-08-03 NOTE — Unmapped (Signed)
[facility] Refill Coordination Note  Specialty Medication(s): Mycophenolate 500mg  & Prograf 0.5mg   Additional Medications shipped: Chlorthalidone 25mg , Omeprazole 20mg , Levothyroxine , Diltiazem 180mg , Amlodipine 5mg , Lisinopril 40mg , Atorvastatin 80mg , Tamsulosin 0.4mg  & Accu-Chek Test Strips.    Andrew Dickerson, DOB: September 04, 1956  Phone: [phone] (home) , Alternate phone contact: N/A  Phone or address changes today?: No  All above HIPAA information was verified with patient.  Shipping Address: [address]  [place] [address]   Insurance changes? No    Completed refill call assessment today to schedule patient's medication shipment from the [facility] [phone]).      Confirmed the medication and dosage are correct and have not changed: Yes, regimen is correct and unchanged.    Confirmed patient started or stopped the following medications in the past month:  No, there are no changes reported at this time.    Are you tolerating your medication?:  Andrew Dickerson reports tolerating the medication.    ADHERENCE    (Below is required for Medicare Part B or Transplant patients only - per drug):   How many tablets were dispensed last month:   Cellcept 500 mg   Quantity filled last month: 60   # of tablets left on hand: 10 DAYS    Prograf 0.5 mg   Quantity filled last month: 120   # of tablets left on hand: 10 DAYS    Did you miss any doses in the past 4 weeks? No missed doses reported.    FINANCIAL/SHIPPING    Delivery Scheduled: Yes, Expected medication delivery date: 08/11/2017     Andrew Dickerson did not have any additional questions at this time.    Delivery address validated in FSI scheduling system: Yes, address listed in FSI is correct.    We will follow up with patient monthly for standard refill processing and delivery.      Thank you,  Tamala Fothergill   [facility] Shared [facility] Specialty Technician

## 2017-08-05 LAB — HLA DS POST TRANSPLANT
ANTI-DONOR DRW #1 MFI: 212 MFI
ANTI-DONOR DRW #2 MFI: 348 MFI
ANTI-DONOR HLA-A #1 MFI: 0 MFI
ANTI-DONOR HLA-A #2 MFI: 0 MFI
ANTI-DONOR HLA-B #1 MFI: 1 MFI
ANTI-DONOR HLA-B #2 MFI: 88 MFI
ANTI-DONOR HLA-C #1 MFI: 0 MFI
ANTI-DONOR HLA-DQB #1 MFI: 244 MFI
ANTI-DONOR HLA-DQB #2 MFI: 44 MFI
ANTI-DONOR HLA-DR #1 MFI: 88 MFI
ANTI-DONOR HLA-DR #2 MFI: 1495 MFI — ABNORMAL HIGH
DONOR DRW ANTIGEN #1: 52
DONOR DRW ANTIGEN #2: 51
DONOR HLA-A ANTIGEN #1: 30
DONOR HLA-A ANTIGEN #2: 74
DONOR HLA-B ANTIGEN #1: 42
DONOR HLA-B ANTIGEN #2: 57
DONOR HLA-C ANTIGEN #1: 17
DONOR HLA-DQB ANTIGEN #1: 4
DONOR HLA-DQB ANTIGEN #2: 5
DONOR HLA-DR ANTIGEN #1: 18
DONOR HLA-DR ANTIGEN #2: 16

## 2017-08-05 LAB — FSAB CLASS 1 ANTIBODY SPECIFICITY

## 2017-08-05 LAB — FSAB CLASS 2 ANTIBODY SPECIFICITY: HLA CL2 AB RESULT: POSITIVE

## 2017-08-05 LAB — DONOR HLA-DP ANTIGEN #2: Lab: 0

## 2017-08-05 LAB — CPRA%: Lab: 0

## 2017-08-05 LAB — CLASS 2 ANTIBODIES IDENTIFIED

## 2017-08-05 NOTE — Unmapped (Addendum)
Reviewed with Nyra Market, NP    LHC    LHC: No angiographically apparent diease in the coronary arteries.      CXR: No focal consolidation or pulmonary edema.No pleural effusion or pneumothoraces    DSA: Of note DR16 1495.     Allomap: 34    Immunosuppression:   Tac: 1 mg BID  and MMF: 500 mg BID   Tac: 5-8    Changes: None  Next Labs: 3 Months  RTC: 01/2018, sooner PRN    Spoke with Andrew Dickerson regarding his lipid panel as it is elevated and he reports he is taking his Lipitor as prescribed. Reports he is on a high fat low carb diet, which is likely the cause of his elevated panel. Discussed his diet regimen as well as his exercise. Will be more mindful of fat content, has been aiming to exercise 3 x week as well as achieve 10,000 steps on those days. Will repeat lipid panel with next labs. Andrew Dickerson verbalized understanding & agreed with the plan.

## 2017-08-09 MED FILL — TAMSULOSIN/0.4MG/CAP: TAMSULOSIN/0.4MG/CAP | 30 days supply | Qty: 30 | Fill #11

## 2017-08-09 MED FILL — MYCOPHENOLATE MOFETIL/500MG/TABS: MYCOPHENOLATE MOFETIL/500MG/TABS | 30 days supply | Qty: 60 | Fill #7

## 2017-08-09 MED FILL — LEVOTHYROXINE SODIUM/75MCG/TABS: LEVOTHYROXINE SODIUM/75MCG/TABS | 30 days supply | Qty: 30 | Fill #1

## 2017-08-09 MED FILL — CHLORTHALIDONE/25MG/TABS: CHLORTHALIDONE/25MG/TABS | 30 days supply | Qty: 30 | Fill #1

## 2017-08-09 MED FILL — ATORVASTATIN CALCIUM/80MG/TABS: ATORVASTATIN CALCIUM/80MG/TABS | 30 days supply | Qty: 30 | Fill #8

## 2017-08-09 MED FILL — AMLODIPINE/5MG/TABS: AMLODIPINE/5MG/TABS | 30 days supply | Qty: 30 | Fill #8

## 2017-08-09 MED FILL — LISINOPRIL/40MG/TABS: LISINOPRIL/40MG/TABS | 30 days supply | Qty: 30 | Fill #3

## 2017-08-09 MED FILL — OMEPRAZOLE/20MG/CPDR: OMEPRAZOLE/20MG/CPDR | 30 days supply | Qty: 30 | Fill #8

## 2017-08-09 MED FILL — ACCU-CHEK AVIVA PLUS/STRIPS/STRP: ACCU-CHEK AVIVA PLUS/STRIPS/STRP | 33 days supply | Qty: 4 | Fill #1

## 2017-08-09 MED FILL — PROGRAF/0.5MG/CAP: PROGRAF/0.5MG/CAP | 30 days supply | Qty: 120 | Fill #4

## 2017-08-10 MED ORDER — DILTIAZEM CD 180 MG CAPSULE,EXTENDED RELEASE 24 HR
ORAL_CAPSULE | ORAL | 11 refills | 0.00000 days | Status: CP
Start: 2017-08-10 — End: 2018-07-19

## 2017-08-10 MED ORDER — DILTIAZEM CD 180 MG CAPSULE,EXTENDED RELEASE 24 HR: 360 mg | capsule | 11 refills | 0 days

## 2017-08-10 MED ORDER — DILTIAZEM CD 180 MG CAPSULE,EXTENDED RELEASE 24 HR: capsule | 11 refills | 0 days | Status: AC

## 2017-08-14 MED FILL — DILTIAZEM HCL ER/180MG ER/CP24: DILTIAZEM HCL ER/180MG ER/CP24 | 30 days supply | Qty: 60 | Fill #0

## 2017-09-05 NOTE — Unmapped (Signed)
[facility] Special[facility] Pharmacy Refill Coordination Note  Specialty Medication(s): Prograf 0.5mg , Mycophenolate 500mg   Additional Medications shipped: Diltiazem ER 180mg , Tamsulosin 0.4mg , Atorvastatin 80mg , Lisinopril 40mg , Chlorthalidone 25mg , Omeprazole 20mg , Levothyroxine , Amlodipine 5mg ,    Andrew Dickerson, DOB: 1956/06/08  Phone: [phone] (home) , Alternate phone contact: N/A  Phone or address changes today?: No  All above HIPAA information was verified with patient.  Shipping Address: [address]  [place] [address]   Insurance changes? No    Completed refill call assessment today to schedule patient's medication shipment from the [facility] [phone]).      Confirmed the medication and dosage are correct and have not changed: Yes, regimen is correct and unchanged.    Confirmed patient started or stopped the following medications in the past month:  No, there are no changes reported at this time.    Are you tolerating your medication?:  Andrew Dickerson reports tolerating the medication.    ADHERENCE    Is this medicine transplant or covered by Medicare Part B? Yes.    Prograf #120 dispensed 08/09/17, #28 left  Mycophenolate #60 dispensed 08/09/17, #14 left    Did you miss any doses in the past 4 weeks? Yes.  Andrew Dickerson reports missing 1 days of medication therapy in the last 4 weeks.  Andrew Dickerson reports forgetting as the cause of their non-adherance.    FINANCIAL/SHIPPING    Delivery Scheduled: Yes, Expected medication delivery date: 09/09/17     Andrew Dickerson did not have any additional questions at this time.    Delivery address validated in FSI scheduling system: Yes, address listed in FSI is correct.    We will follow up with patient monthly for standard refill processing and delivery.      Thank you,  [employer]   [facility] Shared [facility] Specialty Pharmacist

## 2017-09-08 MED ORDER — TAMSULOSIN 0.4 MG CAPSULE: capsule | 3 refills | 0 days | Status: AC

## 2017-09-08 MED ORDER — TAMSULOSIN 0.4 MG CAPSULE
ORAL_CAPSULE | ORAL | 3 refills | 0.00000 days | Status: CP
Start: 2017-09-08 — End: 2017-09-08

## 2017-09-08 MED FILL — MYCOPHENOLATE MOFETIL/500MG/TABS: MYCOPHENOLATE MOFETIL/500MG/TABS | 30 days supply | Qty: 60 | Fill #8

## 2017-09-08 MED FILL — OMEPRAZOLE/20MG/CPDR: OMEPRAZOLE/20MG/CPDR | 30 days supply | Qty: 30 | Fill #9

## 2017-09-08 MED FILL — PROGRAF/0.5MG/CAP: PROGRAF/0.5MG/CAP | 30 days supply | Qty: 120 | Fill #5

## 2017-09-08 MED FILL — LISINOPRIL/40MG/TABS: LISINOPRIL/40MG/TABS | 30 days supply | Qty: 30 | Fill #4

## 2017-09-08 MED FILL — ATORVASTATIN CALCIUM/80MG/TABS: ATORVASTATIN CALCIUM/80MG/TABS | 30 days supply | Qty: 30 | Fill #9

## 2017-09-08 MED FILL — CHLORTHALIDONE/25MG/TABS: CHLORTHALIDONE/25MG/TABS | 30 days supply | Qty: 30 | Fill #2

## 2017-09-08 MED FILL — DILTIAZEM HCL ER/180MG ER/CP24: DILTIAZEM HCL ER/180MG ER/CP24 | 30 days supply | Qty: 60 | Fill #1

## 2017-09-08 MED FILL — LEVOTHYROXINE SODIUM/75MCG/TABS: LEVOTHYROXINE SODIUM/75MCG/TABS | 30 days supply | Qty: 30 | Fill #2

## 2017-09-08 MED FILL — AMLODIPINE/5MG/TABS: AMLODIPINE/5MG/TABS | 30 days supply | Qty: 30 | Fill #9

## 2017-09-09 MED FILL — TAMSULOSIN/0.4MG/CAP: TAMSULOSIN/0.4MG/CAP | 30 days supply | Qty: 30 | Fill #0

## 2017-10-06 MED FILL — MYCOPHENOLATE MOFE/500MG/TABS: MYCOPHENOLATE MOFE/500MG/TABS | 30 days supply | Qty: 60 | Fill #9

## 2017-10-06 MED FILL — CHLORTHALIDONE/25MG/TABS: CHLORTHALIDONE/25MG/TABS | 30 days supply | Qty: 30 | Fill #3

## 2017-10-06 MED FILL — DILTIAZEM HCL ER/180MG ER/CP24: DILTIAZEM HCL ER/180MG ER/CP24 | 30 days supply | Qty: 60 | Fill #2

## 2017-10-06 MED FILL — LISINOPRIL/40MG/TABS: LISINOPRIL/40MG/TABS | 30 days supply | Qty: 30 | Fill #5

## 2017-10-06 MED FILL — ATORVASTATIN CALCIUM/80MG/TABS: ATORVASTATIN CALCIUM/80MG/TABS | 30 days supply | Qty: 30 | Fill #10

## 2017-10-06 MED FILL — LEVOTHYROXINE SODIUM/75MCG/TABS: LEVOTHYROXINE SODIUM/75MCG/TABS | 30 days supply | Qty: 30 | Fill #3

## 2017-10-06 MED FILL — TAMSULOSIN/0.4MG/CAP: TAMSULOSIN/0.4MG/CAP | 30 days supply | Qty: 30 | Fill #1

## 2017-10-06 MED FILL — OMEPRAZOLE/20MG/CPDR: OMEPRAZOLE/20MG/CPDR | 30 days supply | Qty: 30 | Fill #10

## 2017-10-06 MED FILL — PROGRAF/0.5MG/CAP: PROGRAF/0.5MG/CAP | 30 days supply | Qty: 120 | Fill #6

## 2017-10-06 MED FILL — AMLODIPINE/5MG/TABS: AMLODIPINE/5MG/TABS | 30 days supply | Qty: 30 | Fill #10

## 2017-10-07 NOTE — Unmapped (Signed)
[facility] Special[facility] Pharmacy Refill and Clinical Coordination Note  Medication(s): Prograf 0.5mg , mycophenolate 500mg     Andrew Dickerson, DOB: 1956-06-25  Phone: [phone] (home) , Alternate phone contact: N/A  Shipping address: [address]  [name] [address]  Phone or address changes today?: No  All above HIPAA information verified.  Insurance changes? No    Completed refill and clinical call assessment today to schedule patient's medication shipment from the [facility] [phone]).      MEDICATION RECONCILIATION    Confirmed the medication and dosage are correct and have not changed: Yes, regimen is correct and unchanged.    Were there any changes to your medication(s) in the past month:  No, there are no changes reported at this time.    ADHERENCE    Is this medicine transplant or covered by Medicare Part B? Yes.    Mycophenolate Mofetil 500 mg   Quantity filled last month: 60   # of tablets left on hand: 6 days  Prograf 0.5 mg   Quantity filled last month: 120   # of tablets left on hand: 6 days      Did you miss any doses in the past 4 weeks? No missed doses reported.  Adherence counseling provided? Not needed     SIDE EFFECT MANAGEMENT    Are you tolerating your medication?:  Zyion reports tolerating the medication.  Side effect management discussed: None      Therapy is appropriate and should be continued.    Evidence of clinical benefit: See Epic note from 02/01/17      FINANCIAL/SHIPPING    Delivery Scheduled: Yes, Expected medication delivery date: 10/06/17   Additional medications refilled: No additional medications/refills needed at this time.    [name] did not have any additional questions at this time.    Delivery address validated in FSI scheduling system: Yes, address listed above is correct.      We will follow up with patient monthly for standard refill processing and delivery.      Thank you,  Lupita Shutter   [facility] Specialty Pharmacist

## 2017-11-01 NOTE — Unmapped (Signed)
[facility] Refill Coordination Note  Specialty Medication(s): Prograf, Mycophenolate  Additional Medications shipped: chlorthalidone, diltiazem, tamsulosin, amlodipine, omeprazole, levothyroxine, lisinopril, atorvastatin,     [name], DOB: [birth year]  Phone: [phone] (home) , Alternate phone contact: N/A  Phone or address changes today?: No  All above HIPAA information was verified with patient.  Shipping Address: [address]  [place] [address]   Insurance changes? No    Completed refill call assessment today to schedule patient's medication shipment from the [facility] [phone]).      Confirmed the medication and dosage are correct and have not changed: Yes, regimen is correct and unchanged.    Confirmed patient started or stopped the following medications in the past month:  No, there are no changes reported at this time.    Are you tolerating your medication?:  Delroy reports tolerating the medication.    ADHERENCE    Not part b    Did you miss any doses in the past 4 weeks? No missed doses reported.    FINANCIAL/SHIPPING    Delivery Scheduled: Yes, Expected medication delivery date: Monday, March 4     The patient will receive an FSI print out for each medication shipped and additional FDA Medication Guides as required.  Patient education from [place] or [name] may also be included in the shipment    Kiyoshi did not have any additional questions at this time.    Delivery address validated in FSI scheduling system: Yes, address listed in FSI is correct.    We will follow up with patient monthly for standard refill processing and delivery.      Thank you,  Tawanna Solo Shared [facility] Specialty Pharmacist

## 2017-11-02 DIAGNOSIS — Z79899 Other long term (current) drug therapy: Secondary | ICD-10-CM | POA: Diagnosis not present

## 2017-11-02 DIAGNOSIS — Z48298 Encounter for aftercare following other organ transplant: Secondary | ICD-10-CM | POA: Diagnosis not present

## 2017-11-02 DIAGNOSIS — R7989 Other specified abnormal findings of blood chemistry: Secondary | ICD-10-CM | POA: Diagnosis not present

## 2017-11-02 DIAGNOSIS — Z941 Heart transplant status: Secondary | ICD-10-CM | POA: Diagnosis not present

## 2017-11-03 MED FILL — AMLODIPINE/5MG/TABS: AMLODIPINE/5MG/TABS | 30 days supply | Qty: 30 | Fill #11

## 2017-11-03 MED FILL — TAMSULOSIN/0.4MG/CAP: TAMSULOSIN/0.4MG/CAP | 30 days supply | Qty: 30 | Fill #2

## 2017-11-03 MED FILL — CHLORTHALIDONE/25MG/TABS: CHLORTHALIDONE/25MG/TABS | 30 days supply | Qty: 30 | Fill #4

## 2017-11-03 MED FILL — LEVOTHYROXINE SODIUM/75MCG/TABS: LEVOTHYROXINE SODIUM/75MCG/TABS | 30 days supply | Qty: 30 | Fill #4

## 2017-11-03 MED FILL — PROGRAF/0.5MG/CAP: PROGRAF/0.5MG/CAP | 30 days supply | Qty: 120 | Fill #7

## 2017-11-03 MED FILL — LISINOPRIL/40MG/TABS: LISINOPRIL/40MG/TABS | 30 days supply | Qty: 30 | Fill #6

## 2017-11-03 MED FILL — OMEPRAZOLE/20MG/CPDR: OMEPRAZOLE/20MG/CPDR | 30 days supply | Qty: 30 | Fill #11

## 2017-11-03 MED FILL — DILTIAZEM HCL ER/180MG ER/CP24: DILTIAZEM HCL ER/180MG ER/CP24 | 30 days supply | Qty: 60 | Fill #3

## 2017-11-03 MED FILL — ATORVASTATIN CALCIUM/80MG/TABS: ATORVASTATIN CALCIUM/80MG/TABS | 30 days supply | Qty: 30 | Fill #11

## 2017-11-03 MED FILL — MYCOPHENOLATE MOFETIL/500MG/TABS: MYCOPHENOLATE MOFETIL/500MG/TABS | 30 days supply | Qty: 60 | Fill #10

## 2017-11-03 NOTE — Unmapped (Signed)
Received page from [facility] about an alert value. When I called back, the representative reported he had out of range values but no alert values. Awaiting lab results to come to fax folder.

## 2017-11-07 LAB — LIPID PANEL
CHOLESTEROL/HDL RATIO SCREEN: 4.8
CHOLESTEROL: 155 mg/dL
LDL CHOLESTEROL CALCULATED: 101 mg/dL — AB (ref <=100)
NON-HDL CHOLESTEROL: 123 mg/dL
TRIGLYCERIDES: 119 mg/dL

## 2017-11-07 LAB — CBC W/ DIFFERENTIAL
BASOPHILS ABSOLUTE COUNT: 0.1 10*9/L
BASOPHILS RELATIVE PERCENT: 0.9 %
EOSINOPHILS ABSOLUTE COUNT: 0.2 10*9/L
EOSINOPHILS RELATIVE PERCENT: 2.2 %
HEMATOCRIT: 42.1 %
HEMOGLOBIN: 14.4 g/dL
LYMPHOCYTES ABSOLUTE COUNT: 0.9 10*9/L
LYMPHOCYTES RELATIVE PERCENT: 11.4 %
MEAN CORPUSCULAR HEMOGLOBIN CONC: 34.2 g/dL
MEAN CORPUSCULAR HEMOGLOBIN: 29.6 pg
MEAN PLATELET VOLUME: 10.5 fL
MONOCYTES ABSOLUTE COUNT: 0.5 10*9/L
MONOCYTES RELATIVE PERCENT: 6.6 %
NEUTROPHILS ABSOLUTE COUNT: 6.2 10*9/L
NEUTROPHILS RELATIVE PERCENT: 78.9 %
RED BLOOD CELL COUNT: 4.86 10*12/L
RED CELL DISTRIBUTION WIDTH: 13.1 %
WHITE BLOOD CELL COUNT: 7.8 10*9/L

## 2017-11-07 LAB — COMPREHENSIVE METABOLIC PANEL
ALKALINE PHOSPHATASE: 79 U/L
BILIRUBIN TOTAL: 0.6 mg/dL
BLOOD UREA NITROGEN: 23 mg/dL
CALCIUM: 10 mg/dL
CHLORIDE: 106 mmol/L
CO2: 21 mmol/L
CREATININE: 1.19 mg/dL
GLUCOSE RANDOM: 119 mg/dL — AB (ref 65–99)
POTASSIUM: 4.6 mmol/L
PROTEIN TOTAL: 7 g/dL
SODIUM: 139 mmol/L

## 2017-11-07 LAB — TACROLIMUS LEVEL, TROUGH: TACROLIMUS, TROUGH: 6.8 ng/mL (ref <=20.0)

## 2017-11-07 LAB — HDL CHOLESTEROL: Lab: 32 — AB

## 2017-11-07 LAB — TACROLIMUS, TROUGH: Lab: 6.8

## 2017-11-07 LAB — BILIRUBIN TOTAL: Lab: 0.6

## 2017-11-07 LAB — MEAN CORPUSCULAR VOLUME: Lab: 86.6

## 2017-11-07 LAB — ALBUMIN: Lab: 4.6

## 2017-11-07 LAB — MAGNESIUM: Lab: 1.3 — AB

## 2017-11-09 NOTE — Unmapped (Signed)
duplicate

## 2017-11-11 ENCOUNTER — Encounter: Payer: Self-pay | Admitting: Family Medicine

## 2017-11-11 ENCOUNTER — Ambulatory Visit (INDEPENDENT_AMBULATORY_CARE_PROVIDER_SITE_OTHER): Payer: Medicare HMO | Admitting: Family Medicine

## 2017-11-11 VITALS — BP 124/72 | HR 101 | Temp 97.7°F | Ht 68.0 in | Wt 237.4 lb

## 2017-11-11 DIAGNOSIS — R05 Cough: Secondary | ICD-10-CM | POA: Diagnosis not present

## 2017-11-11 DIAGNOSIS — R350 Frequency of micturition: Secondary | ICD-10-CM

## 2017-11-11 DIAGNOSIS — R059 Cough, unspecified: Secondary | ICD-10-CM

## 2017-11-11 LAB — POCT URINALYSIS DIPSTICK
Bilirubin, UA: NEGATIVE
GLUCOSE UA: NEGATIVE
KETONES UA: NEGATIVE
LEUKOCYTES UA: NEGATIVE
Nitrite, UA: NEGATIVE
Protein, UA: NEGATIVE
RBC UA: NEGATIVE
SPEC GRAV UA: 1.02 (ref 1.010–1.025)
Urobilinogen, UA: 0.2 E.U./dL
pH, UA: 5.5 (ref 5.0–8.0)

## 2017-11-11 MED ORDER — IPRATROPIUM BROMIDE 0.06 % NA SOLN: 2.0000 | Freq: Four times a day (QID) | NASAL | 0 refills | Status: DC

## 2017-11-11 MED ORDER — DOXYCYCLINE HYCLATE 100 MG PO TABS: 100.0000 mg | ORAL_TABLET | Freq: Two times a day (BID) | ORAL | 0 refills | Status: DC

## 2017-11-11 MED ORDER — BENZONATATE 200 MG PO CAPS: 200.0000 mg | ORAL_CAPSULE | Freq: Two times a day (BID) | ORAL | 0 refills | Status: DC | PRN

## 2017-11-11 NOTE — Progress Notes (Signed)
    Subjective:  [name] is a 62 y.o. male who presents today for same-day appointment with a chief complaint of cough.   HPI:  Cough, acute issue Symptoms started 2 days ago.  Associated with rhinorrhea, sore throat, some chills, and myalgias.  Also had some lower abdominal pain.  No dysuria.  Some increased frequency.  He has not tried any over-the-counter medications.  His mother has been sick with similar symptoms.  Symptoms are worse at night.  No other obvious alleviating or aggravating factors.  ROS: Per HPI  PMH: He reports that he quit smoking about 18 years ago. His smoking use included cigarettes. He has a 20.00 pack-year smoking history. he has never used smokeless tobacco. He reports that he does not drink alcohol or use drugs.  Objective:  Physical Exam: BP 124/72 (BP Location: Left Arm, Patient Position: Sitting, Cuff Size: Normal)   Pulse (!) 101   Temp 97.7 F (36.5 C) (Oral)   Ht 5\' 8"  (1.727 m)   Wt 237 lb 6.4 oz (107.7 kg)   SpO2 98%   BMI 36.10 kg/m   Gen: NAD, resting comfortably HEENT: TMs clear bilaterally.  Oropharynx erythematous without exudate.  Nasal mucosa boggy and erythematous with thick, green nasal discharge bilaterally. CV: RRR with no murmurs appreciated Pulm: Normal work of breathing.  Bibasilar wheezes noted more prominent in the left base.  Occasional rhonchorous breath sound. MSK: No CVA tenderness  Results for orders placed or performed in visit on 11/11/17 (from the past 24 hour(s))  POCT urinalysis dipstick     Status: Normal   Collection Time: 11/11/17  4:00 PM  Result Value Ref Range   Color, UA Yellow    Clarity, UA Clear    Glucose, UA Negative    Bilirubin, UA Negative    Ketones, UA Negative    Spec Grav, UA 1.020 1.010 - 1.025   Blood, UA Negative    pH, UA 5.5 5.0 - 8.0   Protein, UA Negative    Urobilinogen, UA 0.2 0.2 or 1.0 E.U./dL   Nitrite, UA Negative    Leukocytes, UA Negative Negative   Appearance     Odor     Assessment/Plan:  Cough Most likely secondary to viral URI, however patient does have a few wheezes and rhonchi on his pulmonary exam.  His lung exam is otherwise stable and he is satting at 98% on room air.  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  He is also at increased risk for COPD given his smoking history.  Given his numerous risk factors, we will start antibiotics today.  Start doxycycline for 7-day course.  I will also prescribe Atrovent for his rhinorrhea and Tessalon for his cough.  Given his smoking history, would be reasonable at some point in the future to check PFTs to rule out COPD.  We will avoid steroids today given his history of type 2 diabetes.  Strict return precautions reviewed.  Lower abdominal pain, urinary frequency UA negative today.  Check urine culture.  Algis Greenhouse. Jerline Pain, MD 11/11/2017 4:06 PM

## 2017-11-11 NOTE — Patient Instructions (Signed)
Start the atrovent, doxycycline, and tessalon.   Please stay well hydrated.  You can take tylenol and/or motrin as needed for low grade fever and pain.  Please let me know if your symptoms worsen or fail to improve.  Take care, Dr Jerline Pain

## 2017-11-11 NOTE — Unmapped (Signed)
Lab Results   Component Value Date    TACROLIMUS 6.8 11/02/2017     Goal: Tac: 5-8  Current Dose:1 mg BID    Lab Results   Component Value Date    BUN 23 11/02/2017    CREATININE 1.19 11/02/2017    K 4.6 11/02/2017    GLU 119 (A) 11/02/2017    MG 1.3 (A) 11/02/2017     Lab Results   Component Value Date    WBC 7.8 11/02/2017    HGB 14.4 11/02/2017    HCT 42.1 11/02/2017    PLT 266 11/02/2017    NEUTROABS 6.2 11/02/2017    EOSABS 0.2 11/02/2017        Ref. Range 11/02/2017 09:03   Cholesterol Latest Units: mg/dL 161   Triglycerides Latest Units: mg/dL 096   HDL Latest Ref Range: 40 mg/dL 32 (A)   LDL Calculated Latest Ref Range: 100 mg/dL 045 (A)   Non-HDL Cholesterol Latest Units: mg/dL 409   Chol/HDL Ratio Unknown 4.8         DISCUSSED WITH:   Nyra Market, NP and Dr. Cherly Hensen  Plan: Make No Changes  Next Labs: 2 Months     Mr Main reports he is currently at his local doctor because he is fighting what he believes to be a cold. He has had a temperature of 100.8. Would like me to call back to schedule appointment with Dr. Cherly Hensen. Will keep me informed of what he learns from the PCP regarding his cold.   Mr. Nardozzi verbalized understanding & agreed with the plan.

## 2017-11-12 LAB — URINE CULTURE
MICRO NUMBER:: 90300070
Result:: NO GROWTH
SPECIMEN QUALITY: ADEQUATE

## 2017-11-14 ENCOUNTER — Telehealth: Payer: Self-pay | Admitting: Family Medicine

## 2017-11-14 NOTE — Telephone Encounter (Signed)
Copied from [place] [id number]. Topic: Quick Communication - Lab Results >> Nov 14, 2017 10:19 AM [facility], [place], [place] wrote: Called patient to inform them of 11/14/2017 lab results. When patient returns call, triage nurse may disclose results.

## 2017-11-14 NOTE — Progress Notes (Signed)
Dr Marigene Ehlers interpretation of your lab work:  Your urine culture was negative for any signs of infection. We do not need to start any new medications or antibiotics. Please let us know if your symptoms are worsening or have not resolved.    If you have any additional questions, please give [place] a call or send [place] a message through [place].  Take care, Dr Jerline Pain

## 2017-11-14 NOTE — Telephone Encounter (Signed)
Results in result note.

## 2017-12-05 NOTE — Unmapped (Signed)
[facility] Refill Coordination Note  Specialty Medication(s): Prograf 0.5mg  & Mycophenolic 500mg   Additional Medications shipped: Omeprazole 20mg , Lisinopril 40mg , Tamsulosin 0.4mg , Diltiazem 180mg , Levothyroxine , Amlodipine 5mg , Atorvastatin 80mg , Chlorthalidone 25mg     Andrew Dickerson, DOB: 01/03/1956  Phone: [phone] (home) , Alternate phone contact: N/A  Phone or address changes today?: No  All above HIPAA information was verified with patient.  Shipping Address: [address]  [place] [address]   Insurance changes? No    Completed refill call assessment today to schedule patient's medication shipment from the [facility] [phone]).      Confirmed the medication and dosage are correct and have not changed: Yes, regimen is correct and unchanged.    Confirmed patient started or stopped the following medications in the past month:  No, there are no changes reported at this time.    Are you tolerating your medication?:  Terel reports tolerating the medication.    ADHERENCE    (Below is required for Medicare Part B or Transplant patients only - per drug):   How many tablets were dispensed last month:     Mycophenolate Mofetil 500 mg   Quantity filled last month: 60   # of tablets left on hand: 7 days    Prograf 0.5 mg   Quantity filled last month: 120   # of tablets left on hand: 7 days    Did you miss any doses in the past 4 weeks? No missed doses reported.    FINANCIAL/SHIPPING    Delivery Scheduled: Yes, Expected medication delivery date: 12/09/2017     The patient will receive an FSI print out for each medication shipped and additional FDA Medication Guides as required.  Patient education from [place] or [name] may also be included in the shipment    Varian did not have any additional questions at this time.    Delivery address validated in FSI scheduling system: Yes, address listed in FSI is correct.    We will follow up with patient monthly for standard refill processing and delivery.      Thank you,  Tamala Fothergill   [facility] Shared [facility] Specialty Technician

## 2017-12-06 MED ORDER — AMLODIPINE 5 MG TABLET: 5 mg | tablet | Freq: Every day | 11 refills | 0 days | Status: AC

## 2017-12-06 MED ORDER — ATORVASTATIN 80 MG TABLET: 80 mg | tablet | Freq: Every day | 11 refills | 0 days | Status: AC

## 2017-12-06 MED ORDER — OMEPRAZOLE 20 MG CAPSULE,DELAYED RELEASE
ORAL_CAPSULE | Freq: Every day | ORAL | 11 refills | 0.00000 days | Status: CP
Start: 2017-12-06 — End: 2017-12-06

## 2017-12-06 MED ORDER — AMLODIPINE 5 MG TABLET: tablet | 11 refills | 0 days

## 2017-12-06 MED ORDER — OMEPRAZOLE 20 MG CAPSULE,DELAYED RELEASE: capsule | 11 refills | 0 days

## 2017-12-06 MED ORDER — ATORVASTATIN 80 MG TABLET
ORAL_TABLET | Freq: Every day | ORAL | 11 refills | 0.00000 days | Status: CP
Start: 2017-12-06 — End: 2019-01-02

## 2017-12-06 MED ORDER — AMLODIPINE 5 MG TABLET
ORAL_TABLET | Freq: Every day | ORAL | 11 refills | 0.00000 days | Status: CP
Start: 2017-12-06 — End: 2017-12-06

## 2017-12-06 MED ORDER — ATORVASTATIN 80 MG TABLET: tablet | 11 refills | 0 days

## 2017-12-07 MED FILL — ATORVASTATIN CALCIUM/80MG/TABS: ATORVASTATIN CALCIUM/80MG/TABS | 30 days supply | Qty: 30 | Fill #0

## 2017-12-07 MED FILL — AMLODIPINE/5MG/TABS: AMLODIPINE/5MG/TABS | 30 days supply | Qty: 30 | Fill #0

## 2017-12-07 MED FILL — OMEPRAZOLE/20MG/CPDR: OMEPRAZOLE/20MG/CPDR | 30 days supply | Qty: 30 | Fill #0

## 2017-12-07 MED FILL — LISINOPRIL/40MG/TABS: LISINOPRIL/40MG/TABS | 30 days supply | Qty: 30 | Fill #7

## 2017-12-07 MED FILL — TAMSULOSIN/0.4MG/CAP: TAMSULOSIN/0.4MG/CAP | 30 days supply | Qty: 30 | Fill #3

## 2017-12-07 MED FILL — DILTIAZEM HCL ER/180MG ER/CP24: DILTIAZEM HCL ER/180MG ER/CP24 | 30 days supply | Qty: 60 | Fill #4

## 2017-12-07 MED FILL — PROGRAF/0.5MG/CAP: PROGRAF/0.5MG/CAP | 30 days supply | Qty: 120 | Fill #8

## 2017-12-07 MED FILL — LEVOTHYROXINE SODIUM/75MCG/TABS: LEVOTHYROXINE SODIUM/75MCG/TABS | 30 days supply | Qty: 30 | Fill #5

## 2017-12-07 MED FILL — CHLORTHALIDONE/25MG/TABS: CHLORTHALIDONE/25MG/TABS | 30 days supply | Qty: 30 | Fill #5

## 2017-12-08 MED ORDER — MYCOPHENOLATE MOFETIL 250 MG CAPSULE: 500 mg | capsule | Freq: Two times a day (BID) | 11 refills | 0 days | Status: AC

## 2017-12-08 MED ORDER — MYCOPHENOLATE MOFETIL 250 MG CAPSULE
ORAL_CAPSULE | Freq: Two times a day (BID) | ORAL | 11 refills | 0.00000 days | Status: CP
Start: 2017-12-08 — End: 2018-07-19

## 2017-12-08 MED ORDER — MYCOPHENOLATE MOFETIL 250 MG CAPSULE: capsule | 11 refills | 0 days

## 2017-12-09 MED FILL — MYCOPHENOLATE MOFETIL/250MG/CAPS: MYCOPHENOLATE MOFETIL/250MG/CAPS | 30 days supply | Qty: 120 | Fill #0

## 2017-12-16 NOTE — Progress Notes (Signed)
Subjective:   [name] is a 62 y.o. male who presents for an Initial Medicare Annual Wellness Visit.  Lives in two story home with mother. No problem with stairs.   Review of Systems  No ROS.  Medicare Wellness Visit. Additional risk factors are reflected in the social history.  Sleeps an average of 6 hrs. States he feels rested when he wakes up. Does not nap.  Cardiac Risk Factors include: advanced age (>40men, >57 women);diabetes mellitus;family history of premature cardiovascular disease;male gender    Objective:    Today's Vitals   12/20/17 1345  BP: (!) 144/76  Pulse: 86  Resp: 16  SpO2: 97%  Weight: 231 lb (104.8 kg)  Height: 5\' 8"  (1.727 m)   Body mass index is 35.12 kg/m.  Advanced Directives 12/20/2017 04/25/2017 04/18/2017 02/24/2016 01/20/2016 12/27/2015 12/26/2015  Does Patient Have a Medical Advance Directive? No No No No No No No  Would patient like information on creating a medical advance directive? No - Patient declined - - No - patient declined information No - patient declined information No - patient declined information -    Current Medications (verified) Outpatient Encounter Medications as of 12/20/2017  Medication Sig  . amLODipine (NORVASC) 5 MG tablet Take 1 tablet (5 mg total) by mouth daily.  [name] aspirin EC 81 MG tablet Take 1 tablet (81 mg total) by mouth daily.  [name] atorvastatin (LIPITOR) 80 MG tablet Take 1 tablet (80 mg total) by mouth daily at 6 PM.  . chlorthalidone (HYGROTON) 25 MG tablet Take 1 tablet (25 mg total) by mouth daily.  . cholecalciferol (VITAMIN D) 1000 units tablet Take 3 tablets (3,000 Units total) by mouth daily.  [name] diltiazem (TIAZAC) 180 MG 24 hr capsule Take 2 capsules (360 mg total) by mouth daily.  [name] levothyroxine (SYNTHROID, LEVOTHROID) 75 MCG tablet Take 1 tablet (75 mcg total) by mouth daily before breakfast.  . lisinopril (PRINIVIL,ZESTRIL) 40 MG tablet Take 1 tablet (40 mg total) by mouth daily.  . magnesium  oxide (MAG-OX) 400 MG tablet Take 1.5 tablets (600 mg total) by mouth 2 (two) times daily.  . metFORMIN (GLUCOPHAGE) 1000 MG tablet TAKE ONE TABLET BY MOUTH TWICE DAILY WITH MEALS  . mycophenolate (CELLCEPT) 500 MG tablet Take 1 tablet (500 mg total) by mouth 2 (two) times daily.  [name] omeprazole (PRILOSEC) 20 MG capsule Take 1 capsule (20 mg total) by mouth daily.  . tacrolimus (PROGRAF) 0.5 MG capsule Take 2 capsules (1 mg total) by mouth 2 (two) times daily.  . tamsulosin (FLOMAX) 0.4 MG CAPS capsule Take 1 capsule (0.4 mg total) by mouth daily.  . [DISCONTINUED] benzonatate (TESSALON) 200 MG capsule Take 1 capsule (200 mg total) by mouth 2 (two) times daily as needed for cough.  . [DISCONTINUED] doxycycline (VIBRA-TABS) 100 MG tablet Take 1 tablet (100 mg total) by mouth 2 (two) times daily.  . [DISCONTINUED] ipratropium (ATROVENT) 0.06 % nasal spray Place 2 sprays into both nostrils 4 (four) times daily.   Facility-Administered Encounter Medications as of 12/20/2017  Medication  . 0.9 %  sodium chloride infusion  . 0.9 %  sodium chloride infusion    Allergies (verified) Patient has no known allergies.   History: Past Medical History:  Diagnosis Date  . Abdominal pain, epigastric 03/31/2009  . Acute on chronic systolic heart failure ([place]) 03/02/2009  . ANEMIA, OTHER UNSPEC 04/03/2008  . 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 Blood transfusion without reported diagnosis   .  CHF (congestive heart failure) ([place])   . CORONARY ARTERY DISEASE 04/25/2007  . DIABETES MELLITUS, TYPE II 12/13/2008   no meds, diet contolled  . GERD (gastroesophageal reflux disease)   . HYPERLIPIDEMIA 04/25/2007  . Hypertension   . HYPERTENSION, HX OF 05/14/2008  . 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  . MYOCARDIAL INFARCTION, 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  . OBESITY 02/26/2009  . Thyroid disease   . THYROID DISEASE, HX OF 05/14/2008  . VENTRICULAR TACHYCARDIA 02/26/2009   Past Surgical History:  Procedure  Laterality Date  . ANGIOPLASTY     with stent  . CARDIAC ASSIST DEVICE REMOVAL    . CARDIAC CATHETERIZATION    . COLONOSCOPY    . CORONARY ARTERY BYPASS GRAFT    . 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  . LEFT VENTRICULAR ASSIST DEVICE  05/2009   [facility]   Family History  Problem Relation Age of Onset  . Heart disease Mother   . Lung cancer Maternal Grandfather   . Lung cancer Maternal Grandmother   . Colon cancer Neg Hx   . Esophageal cancer Neg Hx   . Prostate cancer Neg Hx   . Rectal cancer Neg Hx   . Stomach cancer Neg Hx    Social History   Socioeconomic History  . Marital status: Single    Spouse name: Not on file  . Number of children: 1  . Years of education: Not on file  . Highest education level: Not on file  Occupational History  . Not on file  Social Needs  . Financial resource strain: Not on file  . Food insecurity:    Worry: Not on file    Inability: Not on file  . Transportation needs:    Medical: Not on file    Non-medical: Not on file  Tobacco Use  . Smoking status: Former Smoker    Packs/day: 1.00    Years: 20.00    Pack years: 20.00    Types: Cigarettes    Last attempt to quit: 07/02/1999    Years since quitting: 18.4  . Smokeless tobacco: Never Used  Substance and Sexual Activity  . Alcohol use: No    Comment: once yearly  . Drug use: No  . Sexual activity: Not on file  Lifestyle  . Physical activity:    Days per week: Not on file    Minutes per session: Not on file  . Stress: Not on file  Relationships  . Social connections:    Talks on phone: Not on file    Gets together: Not on file    Attends religious service: Not on file    Active member of club or organization: Not on file    Attends meetings of clubs or organizations: Not on file    Relationship status: Not on file  Other Topics Concern  . Not on file  Social History Narrative   Single. Divorced. 1 son. 1 granddaughter 2014.       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       Hobbies: time on CPU            Tobacco Counseling Counseling given: Not Answered  Activities of Daily Living In your present state of health, do you have any difficulty performing the following activities: 12/20/2017  Hearing? N  Vision? N  Difficulty concentrating or making decisions? N  Walking or climbing stairs? N  Dressing or bathing? N  Doing errands, shopping? N  Preparing Food and eating ?  N  Using the Toilet? N  In the past six months, have you accidently leaked urine? N  Do you have problems with loss of bowel control? N  Managing your Medications? N  Managing your Finances? N  Housekeeping or managing your Housekeeping? N  Some recent data might be hidden     Immunizations and Health Maintenance Immunization History  Administered Date(s) Administered  . Hepatitis A, Adult 06/09/2012  . Hepatitis B 09/14/2011  . Hepatitis B, adult 05/08/2009, 06/09/2012  . HiB (PRP-OMP) 05/02/2009  . Influenza Split 07/02/2011  . Influenza Whole 06/25/2008  . Influenza, Seasonal, Injecte, Preservative Fre 08/06/2010, 06/09/2012  . Influenza,inj,Quad PF,6+ Mos 08/07/2013, 06/19/2014, 08/29/2015, 08/20/2016  . Influenza-Unspecified 06/19/2014  . PPD Test 05/04/2009, 09/14/2011, 06/09/2012  . Pneumococcal Conjugate-13 07/18/2012  . Pneumococcal Polysaccharide-23 09/06/2009, 06/09/2012  . Pneumococcal-Unspecified 05/02/2009  . Td 05/02/2009  . Tdap 09/14/2011   Health Maintenance Due  Topic Date Due  . PNEUMOCOCCAL POLYSACCHARIDE VACCINE (2) 05/02/2014  . OPHTHALMOLOGY EXAM  02/02/2017    Patient Care Team: Marin Olp, MD as PCP - General (Family Medicine)  Indicate any recent Medical Services you may have received from other than [facility] providers in the past year (date may be approximate).    Assessment:   This is a routine wellness examination for Farmer.  Hearing/Vision screen Hearing Screening Comments: States he needs hearing aids.  Resource sheet for audiology given to patient. Vision Screening Comments: 01/2016 - Dr Louretta Shorten, Vladimir Faster  Dietary issues and exercise activities discussed: Current Exercise Habits: Home exercise routine, Type of exercise: treadmill, Time (Minutes): 40, Frequency (Times/Week): 3, Weekly Exercise (Minutes/Week): 120, Intensity: Moderate, Exercise limited by: None identified  2-3 meals/day. Most meals made at home. Utilizes an app for counting calories, carbs and sugars. Drinks water when going to the gym or walking. Drinks 4-6 cans of Coke Zero. Also drinks V8 juice and black tea.  Goals    . Maintain current health status      Depression Screen PHQ 2/9 Scores 12/20/2017 03/17/2016 03/03/2016 02/24/2016  PHQ - 2 Score 0 0 0 0  PHQ- 9 Score 0 - - -    Fall Risk Fall Risk  12/20/2017 03/17/2016 03/03/2016 02/24/2016 01/20/2016  Falls in the past year? No No No No No   Cognitive Function: Ad8 score reviewed for issues:  Issues making decisions:no  Less interest in hobbies / activities:no  Repeats questions, stories (family complaining):no  Trouble using ordinary gadgets (microwave, computer, phone):no  Forgets the month or year: no  Mismanaging finances: no  Remembering appts:no  Daily problems with thinking and/or memory:no Ad8 score is=0 States he does Solitaire on the computer.          Screening Tests Health Maintenance  Topic Date Due  . PNEUMOCOCCAL POLYSACCHARIDE VACCINE (2) 05/02/2014  . OPHTHALMOLOGY EXAM  02/02/2017  . HIV Screening  01/08/2064 (Originally 12/16/1970)  . HEMOGLOBIN A1C  01/18/2018  . FOOT EXAM  02/21/2018  . INFLUENZA VACCINE  04/06/2018  . TETANUS/TDAP  09/13/2021  . COLONOSCOPY  04/26/2027  . Hepatitis C Screening  Addressed      Plan:   Follow up with PCP as directed.  I have personally reviewed and noted the following in the patient's chart:   . Medical and social history . Use of alcohol, tobacco or illicit drugs  . Current medications  and supplements . Functional ability and status . Nutritional status . Physical activity . Advanced directives . List of other physicians .  Vitals . Screenings to include cognitive, depression, and falls . Referrals and appointments  In addition, I have reviewed and discussed with patient certain preventive protocols, quality metrics, and best practice recommendations. A written personalized care plan for preventive services as well as general preventive health recommendations were provided to patient.     Williemae Area, RN   12/20/2017

## 2017-12-16 NOTE — Progress Notes (Signed)
PCP notes:   Health maintenance: PPSV23: Patient states he will get this at his cardiologists office and then let our office know.  Abnormal screenings: None.   Patient concerns:  None.   Nurse concerns: None.   Next PCP appt: 01/13/2018.

## 2017-12-19 IMAGING — DX DG CHEST 2V
2 series · 2 of 2 positions shown · non-contrast
Comparison: Chest x-ray of 04/20/2009 and 01/14/2009

CLINICAL DATA: Cough, congestion for 3 days, former smoking history

EXAM:
CHEST  2 VIEW

[chest pa]
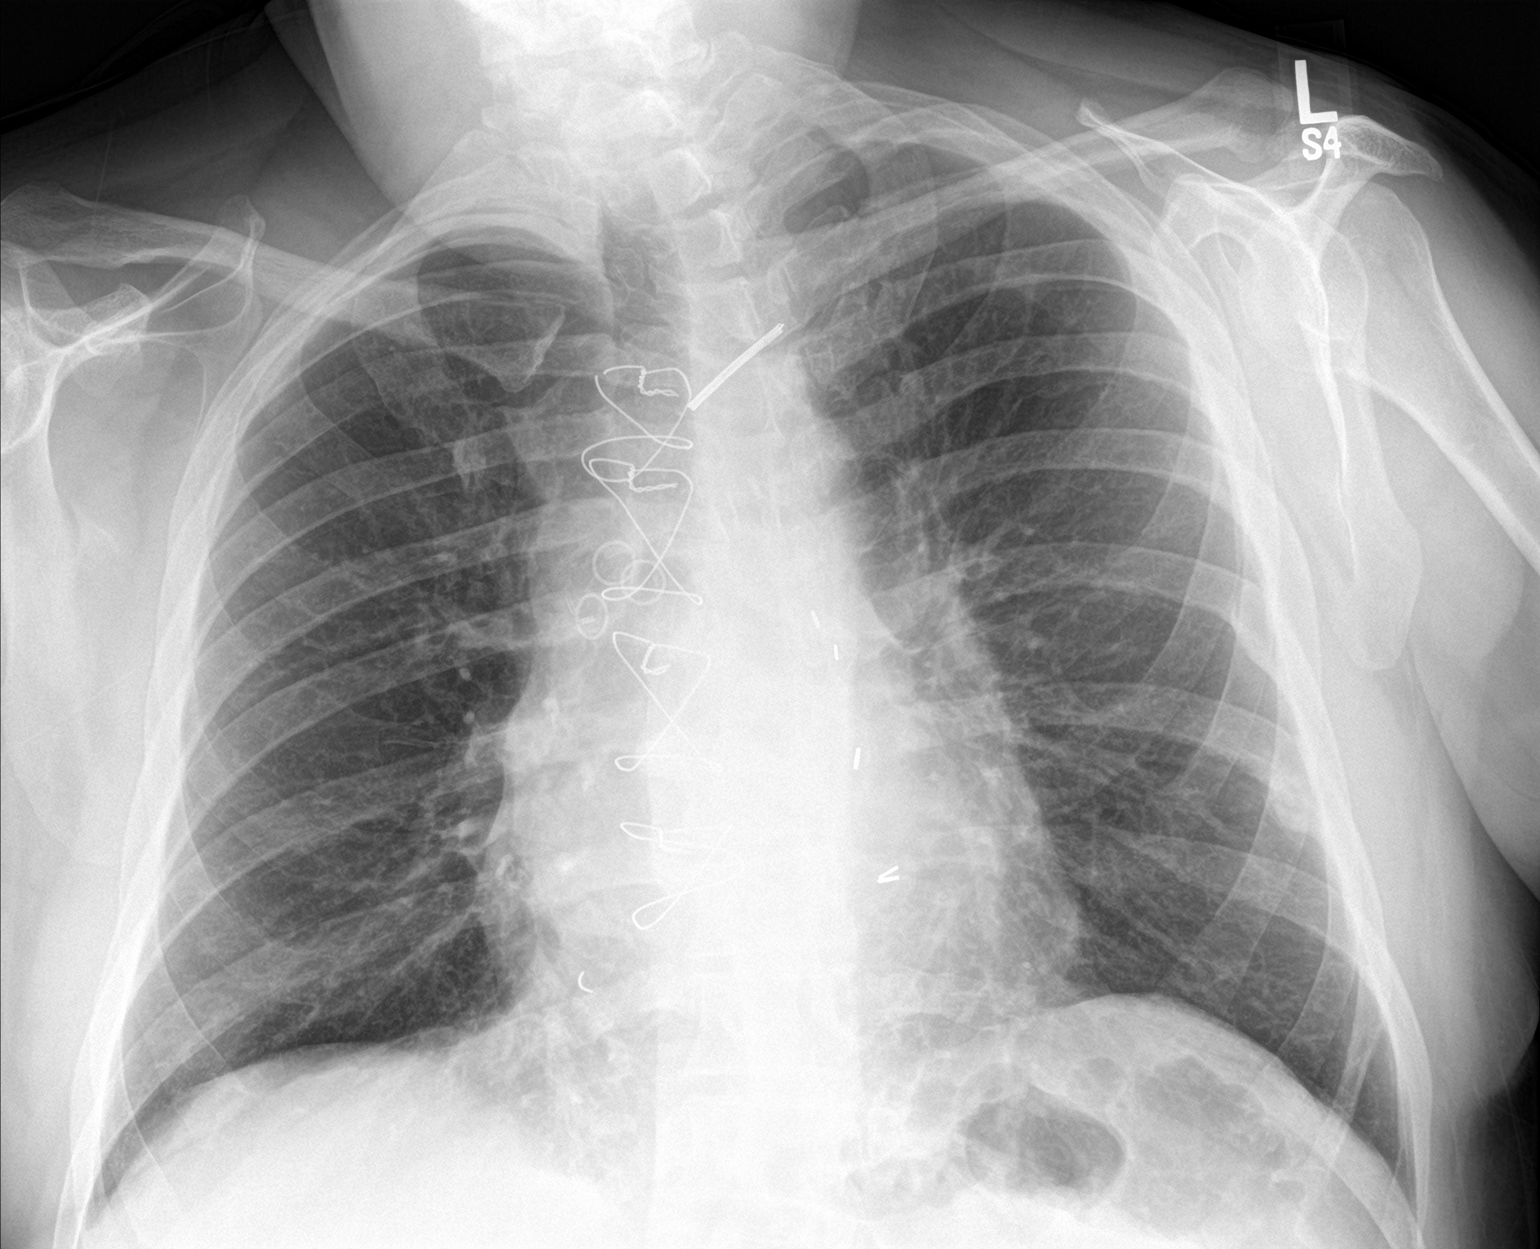

[chest lat]
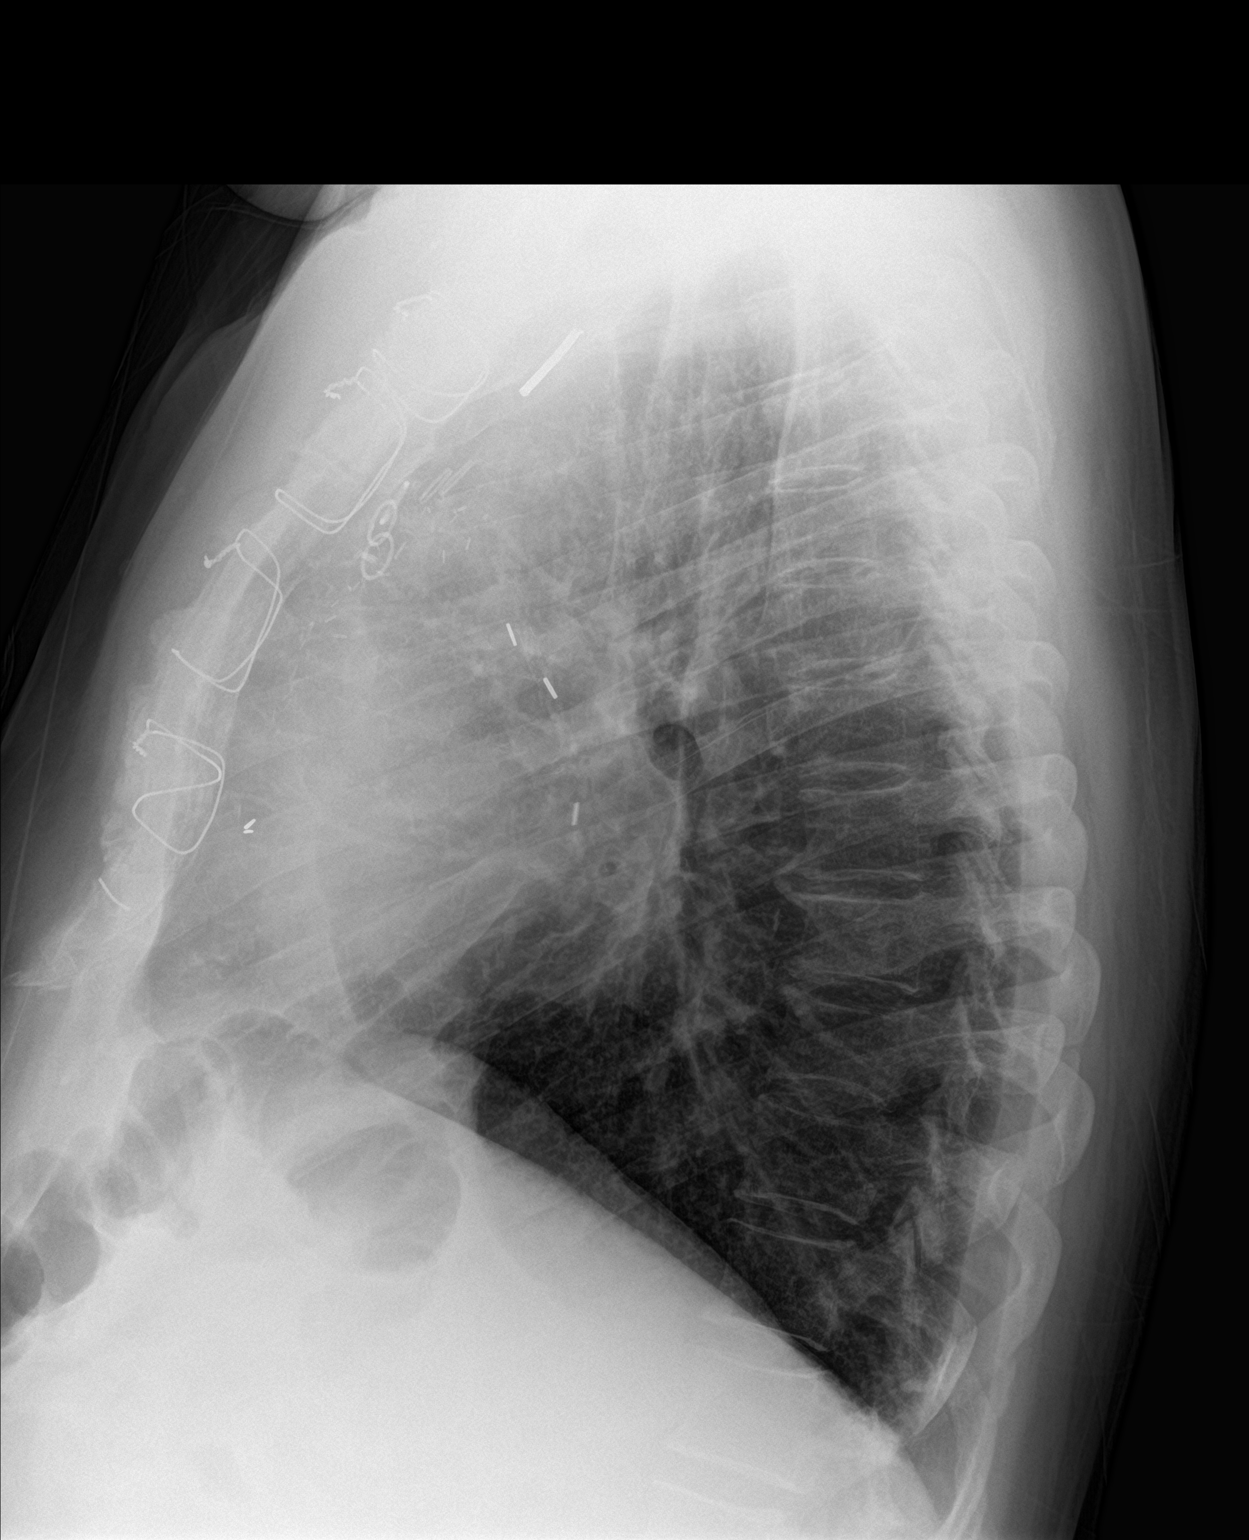

[2 of 2 positions shown; findings below may reference images not displayed]

FINDINGS: No active infiltrate or effusion is seen. The AICD and pacer pack
has been removed. However the distal portion of the AICD lead
appears disconnected overlying the region of the left innominate
vein consistent with a detached lead. There are somewhat prominent
perihilar markings with peribronchial cuffing suggesting bronchitis.
The heart is within upper limits normal and stable. Median
sternotomy sutures are noted from prior CABG. Old healed left rib
fractures are present.
IMPRESSION: 1. Probable bronchitis.  No pneumonia or effusion.
2. Detached lead of previous AICD overlies the region of the left
innominate vein.

## 2017-12-20 ENCOUNTER — Ambulatory Visit (INDEPENDENT_AMBULATORY_CARE_PROVIDER_SITE_OTHER): Payer: Medicare HMO | Admitting: *Deleted

## 2017-12-20 ENCOUNTER — Encounter: Payer: Self-pay | Admitting: *Deleted

## 2017-12-20 VITALS — BP 144/76 | HR 86 | Resp 16 | Ht 68.0 in | Wt 231.0 lb

## 2017-12-20 DIAGNOSIS — Z0000 Encounter for general adult medical examination without abnormal findings: Secondary | ICD-10-CM

## 2017-12-20 NOTE — Progress Notes (Signed)
I have reviewed and agree with note, evaluation, plan.   Stephen Hunter, MD  

## 2017-12-20 NOTE — Patient Instructions (Addendum)
[name] ,  Speak to cardiologist regarding Pneumonia Vaccine.   Thank you for taking time to come for your Medicare Wellness Visit. I appreciate your ongoing commitment to your health goals. Please review the following plan we discussed and let me know if I can assist you in the future.   These are the goals we discussed: Goals    . Maintain current health status       This is a list of the screening recommended for you and due dates:  Health Maintenance  Topic Date Due  . Pneumococcal vaccine (2) 05/02/2014  . Eye exam for diabetics  02/02/2017  . Hemoglobin A1C  07/30/2017  . HIV Screening  01/08/2064*  . Complete foot exam   02/21/2018  . Flu Shot  04/06/2018  . Tetanus Vaccine  09/13/2021  . Colon Cancer Screening  04/26/2027  .  Hepatitis C: One time screening is recommended by Center for Disease Control  (CDC) for  adults born from [birth year] through 1964.   Addressed  *Topic was postponed. The date shown is not the original due date.   Preventive Care for Adults  A healthy lifestyle and preventive care can promote health and wellness. Preventive health guidelines for adults include the following key practices.  . A routine yearly physical is a good way to check with your health care provider about your health and preventive screening. It is a chance to share any concerns and updates on your health and to receive a thorough exam.  . Visit your dentist for a routine exam and preventive care every 6 months. Brush your teeth twice a day and floss once a day. Good oral hygiene prevents tooth decay and gum disease.  . The frequency of eye exams is based on your age, health, family medical history, use  of contact lenses, and other factors. Follow your health care provider's recommendations for frequency of eye exams.  . Eat a healthy diet. Foods like vegetables, fruits, whole grains, low-fat dairy products, and lean protein foods contain the nutrients you need without too many  calories. Decrease your intake of foods high in solid fats, added sugars, and salt. Eat the right amount of calories for you. Get information about a proper diet from your health care provider, if necessary.  . Regular physical exercise is one of the most important things you can do for your health. Most adults should get at least 150 minutes of moderate-intensity exercise (any activity that increases your heart rate and causes you to sweat) each week. In addition, most adults need muscle-strengthening exercises on 2 or more days a week.  Silver Sneakers may be a benefit available to you. To determine eligibility, you may visit the website: www.silversneakers.com or contact program at 937-573-2151 Mon-Fri between 8AM-8PM.   . Maintain a healthy weight. The body mass index (BMI) is a screening tool to identify possible weight problems. It provides an estimate of body fat based on height and weight. Your health care provider can find your BMI and can help you achieve or maintain a healthy weight.   For adults 20 years and older: ? A BMI below 18.5 is considered underweight. ? A BMI of 18.5 to 24.9 is normal. ? A BMI of 25 to 29.9 is considered overweight. ? A BMI of 30 and above is considered obese.   . Maintain normal blood lipids and cholesterol levels by exercising and minimizing your intake of saturated fat. Eat a balanced diet with plenty of fruit and  vegetables. Blood tests for lipids and cholesterol should begin at age 85 and be repeated every 5 years. If your lipid or cholesterol levels are high, you are over 50, or you are at high risk for heart disease, you may need your cholesterol levels checked more frequently. Ongoing high lipid and cholesterol levels should be treated with medicines if diet and exercise are not working.  . If you smoke, find out from your health care provider how to quit. If you do not use tobacco, please do not start.  . If you choose to drink alcohol, please do  not consume more than 2 drinks per day. One drink is considered to be 12 ounces (355 mL) of beer, 5 ounces (148 mL) of wine, or 1.5 ounces (44 mL) of liquor.  . If you are 68-66 years old, ask your health care provider if you should take aspirin to prevent strokes.  . Use sunscreen. Apply sunscreen liberally and repeatedly throughout the day. You should seek shade when your shadow is shorter than you. Protect yourself by wearing long sleeves, pants, a wide-brimmed hat, and sunglasses year round, whenever you are outdoors.  . Once a month, do a whole body skin exam, using a mirror to look at the skin on your back. Tell your health care provider of new moles, moles that have irregular borders, moles that are larger than a pencil eraser, or moles that have changed in shape or color.

## 2017-12-30 NOTE — Unmapped (Signed)
[facility] Refill Coordination Note  Specialty Medication(s): Mycophenolate, Prograf  Additional Medications shipped: levythyroxine, meprazolem lisinopril, chlorthalidone, tamsulosin, amlodipine, atorvastatin, diltiazem, test strips    Andrew Dickerson, DOB: [birth year]  Phone: [phone] (home) , Alternate phone contact: N/A  Phone or address changes today?: No  All above HIPAA information was verified with patient.  Shipping Address: [address]  [place] [address]   Insurance changes? No    Completed refill call assessment today to schedule patient's medication shipment from the [facility] [phone]).      Confirmed the medication and dosage are correct and have not changed: Yes, regimen is correct and unchanged.    Confirmed patient started or stopped the following medications in the past month:  No, there are no changes reported at this time.    Are you tolerating your medication?:  Andrew Dickerson reports tolerating the medication.    ADHERENCE    NOT PART B    Did you miss any doses in the past 4 weeks? No missed doses reported.    FINANCIAL/SHIPPING    Delivery Scheduled: Yes, Expected medication delivery date: Friday, May 3     The patient will receive an FSI print out for each medication shipped and additional FDA Medication Guides as required.  Patient education from [place] or [name] may also be included in the shipment    Andrew Dickerson did not have any additional questions at this time.    Delivery address validated in FSI scheduling system: Yes, address listed in FSI is correct.    We will follow up with patient monthly for standard refill processing and delivery.      Thank you,  Tawanna Solo Shared [facility] Specialty Pharmacist

## 2018-01-04 MED FILL — MYCOPHENOLATE MOFETIL/250MG/CAPS: MYCOPHENOLATE MOFETIL/250MG/CAPS | 30 days supply | Qty: 120 | Fill #1

## 2018-01-04 MED FILL — CHLORTHALIDONE/25MG/TABS: CHLORTHALIDONE/25MG/TABS | 30 days supply | Qty: 30 | Fill #6

## 2018-01-04 MED FILL — LEVOTHYROXINE SODIUM/75MCG/TABS: LEVOTHYROXINE SODIUM/75MCG/TABS | 30 days supply | Qty: 30 | Fill #6

## 2018-01-04 MED FILL — ACCU-CHEK AVIVA PLUS/STRIPS/STRP: ACCU-CHEK AVIVA PLUS/STRIPS/STRP | 33 days supply | Qty: 4 | Fill #2

## 2018-01-04 MED FILL — DILTIAZEM HCL ER/180MG ER/CP24: DILTIAZEM HCL ER/180MG ER/CP24 | 30 days supply | Qty: 60 | Fill #5

## 2018-01-04 MED FILL — LISINOPRIL/40MG/TABS: LISINOPRIL/40MG/TABS | 30 days supply | Qty: 30 | Fill #8

## 2018-01-04 MED FILL — ATORVASTATIN CALCIUM/80MG/TABS: ATORVASTATIN CALCIUM/80MG/TABS | 30 days supply | Qty: 30 | Fill #1

## 2018-01-04 MED FILL — AMLODIPINE/5MG/TABS: AMLODIPINE/5MG/TABS | 30 days supply | Qty: 30 | Fill #1

## 2018-01-04 MED FILL — PROGRAF/0.5MG/CAP: PROGRAF/0.5MG/CAP | 30 days supply | Qty: 120 | Fill #9

## 2018-01-04 MED FILL — OMEPRAZOLE/20MG/CPDR: OMEPRAZOLE/20MG/CPDR | 30 days supply | Qty: 30 | Fill #1

## 2018-01-05 MED ORDER — TAMSULOSIN 0.4 MG CAPSULE
ORAL_CAPSULE | ORAL | 11 refills | 0.00000 days | Status: CP
Start: 2018-01-05 — End: 2018-01-05

## 2018-01-05 MED ORDER — TAMSULOSIN 0.4 MG CAPSULE: capsule | 11 refills | 0 days | Status: AC

## 2018-01-05 MED ORDER — TAMSULOSIN 0.4 MG CAPSULE: capsule | 11 refills | 0 days

## 2018-01-09 MED FILL — TAMSULOSIN/0.4MG/CAP: TAMSULOSIN/0.4MG/CAP | 30 days supply | Qty: 30 | Fill #0

## 2018-01-12 DIAGNOSIS — Z941 Heart transplant status: Secondary | ICD-10-CM | POA: Diagnosis not present

## 2018-01-12 DIAGNOSIS — Z79899 Other long term (current) drug therapy: Secondary | ICD-10-CM | POA: Diagnosis not present

## 2018-01-12 DIAGNOSIS — Z48298 Encounter for aftercare following other organ transplant: Secondary | ICD-10-CM | POA: Diagnosis not present

## 2018-01-12 DIAGNOSIS — R7989 Other specified abnormal findings of blood chemistry: Secondary | ICD-10-CM | POA: Diagnosis not present

## 2018-01-12 DIAGNOSIS — Z125 Encounter for screening for malignant neoplasm of prostate: Secondary | ICD-10-CM | POA: Diagnosis not present

## 2018-01-12 DIAGNOSIS — E559 Vitamin D deficiency, unspecified: Secondary | ICD-10-CM | POA: Diagnosis not present

## 2018-01-12 DIAGNOSIS — E785 Hyperlipidemia, unspecified: Secondary | ICD-10-CM | POA: Diagnosis not present

## 2018-01-13 ENCOUNTER — Ambulatory Visit: Payer: Medicare HMO | Admitting: Family Medicine

## 2018-01-16 ENCOUNTER — Ambulatory Visit (INDEPENDENT_AMBULATORY_CARE_PROVIDER_SITE_OTHER): Payer: Medicare HMO | Admitting: Family Medicine

## 2018-01-16 ENCOUNTER — Encounter: Payer: Self-pay | Admitting: Family Medicine

## 2018-01-16 VITALS — BP 132/68 | HR 83 | Temp 98.4°F | Ht 68.0 in | Wt 233.0 lb

## 2018-01-16 DIAGNOSIS — Z941 Heart transplant status: Secondary | ICD-10-CM | POA: Diagnosis not present

## 2018-01-16 DIAGNOSIS — E119 Type 2 diabetes mellitus without complications: Secondary | ICD-10-CM

## 2018-01-16 DIAGNOSIS — E034 Atrophy of thyroid (acquired): Secondary | ICD-10-CM

## 2018-01-16 DIAGNOSIS — I10 Essential (primary) hypertension: Secondary | ICD-10-CM | POA: Diagnosis not present

## 2018-01-16 DIAGNOSIS — I5022 Chronic systolic (congestive) heart failure: Secondary | ICD-10-CM

## 2018-01-16 DIAGNOSIS — E785 Hyperlipidemia, unspecified: Secondary | ICD-10-CM

## 2018-01-16 NOTE — Assessment & Plan Note (Signed)
S: controlled on lisinopril 40mg , diltiazem 360mg  XR, chlorthalidone 25mg , amlodipine 5mg . Home #s range from 116 all the way up to 150s. Usually in 130s to low 140s.  BP Readings from Last 3 Encounters:  01/16/18 140/78  12/20/17 (!) 144/76  11/11/17 124/72  A/P: We discussed blood pressure goal of <140/90. Continue current meds:  He is going to show his charts to [facility] tomorrow. Hold off on adjustments today

## 2018-01-16 NOTE — Assessment & Plan Note (Signed)
No lasix recently. Only chlorthalidone. Weight up slightly- no edema. No shortness of breath. Continue to monitor.

## 2018-01-16 NOTE — Assessment & Plan Note (Signed)
We will try to get records from [facility] on [facility] tsh- had labs last week

## 2018-01-16 NOTE — Assessment & Plan Note (Signed)
S:  controlled on  metformin 1 g BID.  Lab Results  Component Value Date   HGBA1C 5.6 07/21/2017   HGBA1C 5.5 01/27/2017   HGBA1C 5.4 08/11/2016   A/P: Had a1c last week with [facility] - he will let us know what that # was

## 2018-01-16 NOTE — Assessment & Plan Note (Signed)
S:  doing well on dual immunosuppressants (mycophenolate and tacrolimus). Heart cath 07/2017 reassuring.A/P: doing well-  Sees [facility] tomorrow for follow up

## 2018-01-16 NOTE — Assessment & Plan Note (Signed)
S: reasonably controlled on max dose statin atorvastatin 80mg - not planning on increasing to get LDL under 70 unless cardiology advises  A/P: continue current medicine

## 2018-01-16 NOTE — Progress Notes (Signed)
Subjective:  [name] is a 62 y.o. year old very pleasant male patient who presents for/with See problem oriented charting ROS- No chest pain or shortness of breath. No headache or blurry vision.    Past Medical History-  Patient Active Problem List   Diagnosis Date Noted  . Heart transplanted ([place]) 06/19/2014    Priority: High  . Chronic systolic heart failure ([place]) 03/02/2009    Priority: High  . 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    Priority: High  . 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 Diabetes mellitus type II, controlled ([place]) 12/13/2008    Priority: High  . Coronary atherosclerosis 04/25/2007    Priority: High  . Chronic abdominal pain 12/27/2015    Priority: Medium  . BPH (benign prostatic hypertrophy) 02/26/2014    Priority: Medium  . Hypothyroidism 05/14/2008    Priority: Medium  . Essential hypertension 05/14/2008    Priority: Medium  . ANEMIA, OTHER UNSPEC 04/03/2008    Priority: Medium  . Hyperlipidemia 04/25/2007    Priority: Medium  . GERD (gastroesophageal reflux disease) 06/19/2014    Priority: Low  . Osteoarthritis 06/19/2014    Priority: Low  . History of adenomatous polyp of colon 09/17/2013    Priority: Low  . Morbid obesity ([place]) 02/26/2009    Priority: Low    Medications- reviewed and updated Current Outpatient Medications  Medication Sig Dispense Refill  . amLODipine (NORVASC) 5 MG tablet Take 1 tablet (5 mg total) by mouth daily. 30 tablet 0  . aspirin EC 81 MG tablet Take 1 tablet (81 mg total) by mouth daily. 30 tablet 0  . atorvastatin (LIPITOR) 80 MG tablet Take 1 tablet (80 mg total) by mouth daily at 6 PM. 30 tablet 0  . chlorthalidone (HYGROTON) 25 MG tablet Take 1 tablet (25 mg total) by mouth daily. 30 tablet 0  . cholecalciferol (VITAMIN D) 1000 units tablet Take 3 tablets (3,000 Units total) by mouth daily. 90 tablet 0  . diltiazem (TIAZAC) 180 MG 24 hr capsule Take 2 capsules (360  mg total) by mouth daily. 60 capsule 0  . levothyroxine (SYNTHROID, LEVOTHROID) 75 MCG tablet Take 1 tablet (75 mcg total) by mouth daily before breakfast. 30 tablet 0  . lisinopril (PRINIVIL,ZESTRIL) 40 MG tablet Take 1 tablet (40 mg total) by mouth daily. 30 tablet 0  . magnesium oxide (MAG-OX) 400 MG tablet Take 1.5 tablets (600 mg total) by mouth 2 (two) times daily. 45 tablet 0  . metFORMIN (GLUCOPHAGE) 1000 MG tablet TAKE ONE TABLET BY MOUTH TWICE DAILY WITH MEALS 180 tablet 3  . mycophenolate (CELLCEPT) 500 MG tablet Take 1 tablet (500 mg total) by mouth 2 (two) times daily. 60 tablet 0  . omeprazole (PRILOSEC) 20 MG capsule Take 1 capsule (20 mg total) by mouth daily. 30 capsule 0  . tacrolimus (PROGRAF) 0.5 MG capsule Take 2 capsules (1 mg total) by mouth 2 (two) times daily. 120 capsule 0  . tamsulosin (FLOMAX) 0.4 MG CAPS capsule Take 1 capsule (0.4 mg total) by mouth daily. 30 capsule 0   Current Facility-Administered Medications  Medication Dose Route Frequency Provider Last Rate Last Dose  . 0.9 %  sodium chloride infusion  500 mL Intravenous Continuous Nelida Meuse III, MD        Objective: BP 132/68   Pulse 83   Temp 98.4 F (36.9 C) (Oral)   Ht 5\' 8"  (1.727 m)   Wt 233 lb (  105.7 kg)   SpO2 98%   BMI 35.43 kg/m  Gen: NAD, resting comfortably CV: RRR no murmurs rubs or gallops Lungs: CTAB no crackles, wheeze, rhonchi Abdomen: soft/nontender/nondistended/normal bowel sounds. obese Ext: no edema Skin: warm, dry Neuro: normal gait and speech  Assessment/Plan:  Other notes: 1.TSH and A1c pending form [facility]   Diabetes mellitus type II, controlled ([place]) S:  controlled on  metformin 1 g BID.  Lab Results  Component Value Date   HGBA1C 5.6 07/21/2017   HGBA1C 5.5 01/27/2017   HGBA1C 5.4 08/11/2016   A/P: Had a1c last week with [facility] - he will let us know what that # was  Heart transplanted [facility]) S:  doing well on dual immunosuppressants (mycophenolate and  tacrolimus). Heart cath 07/2017 reassuring.A/P: doing well-  Sees [facility] tomorrow for follow up  Essential hypertension S: controlled on lisinopril 40mg , diltiazem 360mg  XR, chlorthalidone 25mg , amlodipine 5mg . Home #s range from 116 all the way up to 150s. Usually in 130s to low 140s.  BP Readings from Last 3 Encounters:  01/16/18 140/78  12/20/17 (!) 144/76  11/11/17 124/72  A/P: We discussed blood pressure goal of <140/90. Continue current meds:  He is going to show his charts to [facility] tomorrow. Hold off on adjustments today  Hypothyroidism We will [facility] get [facility] t[facility]- had labs last week  Hyperlipidemia S: reasonably controlled on max dose statin atorvastatin 80mg - not planning on increasing to get LDL under 70 unless cardiology advises  A/P: continue current medicine   Morbid obesity ([place]) BMI > 35 with HTn, HLD, DM. Discussed importance of weight loss- he is up 2 lbs- he is hoping for better weather as he enjoys getting out more and being more active then. Set goal of 5-10 lbs off by 6 month follow.    Future Appointments  Date Time Provider [place]  12/26/2018 10:00 AM Williemae Area, RN [facility]   Return in about 6 months (around 07/19/2018) for physical- we will look at labs from [facility].  Return precautions advised.  Garret Reddish, MD

## 2018-01-16 NOTE — Patient Instructions (Addendum)
Health Maintenance Due  Topic Date Due  . PNEUMOCOCCAL POLYSACCHARIDE VACCINE (2)- ask [facility]. I prefer to just wait until you hit 66 05/02/2014  . OPHTHALMOLOGY EXAM - Patient needs to schedule an appointment. 02/02/2017   No changes today  Update [place] on a1c from [facility] please

## 2018-01-16 NOTE — Assessment & Plan Note (Signed)
BMI > 35 with HTn, HLD, DM. Discussed importance of weight loss- he is up 2 lbs- he is hoping for better weather as he enjoys getting out more and being more active then. Set goal of 5-10 lbs off by 6 month follow.

## 2018-01-16 NOTE — Unmapped (Signed)
[facility] Follow Up        Primary Provider: Tana Conch, MD   Urology Provider:     Jethro Bolus, MD  Heart Transplant Provider:   Nicky Pugh, MD  [facility]: Andrew Heaps, MD??     Reason for Visit:  Andrew Dickerson is a 62 y.o. male being seen for his annual heart transplant clinic follow up.     Assessment & Plan:  Overall doing well, making dose adjustments to tacrolimus.     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  On dual immunosuppression (mycophenolate 500 bid and tacrolimus 1 bid with goal 5-8) with normal cardiac function as again seen on today's echo.  Occasional single low level DSA (DR 16); no significant DSA's detected.   No CAV.  - He used to be on Tac 1/0.5 until 02/2014, then 1 BID since then with most levels >7.  Lab Results   Component Value Date    TACROLIMUS 9.1 01/13/2018    TACROLIMUS 6.8 11/02/2017    TACROLIMUS 7.6 11/29/2014    TACROLIMUS 9.2 07/24/2014   - Will decrease Tacrolimus back to 1 mg qAM, 0.5 mg qPM and follow-up trough level next week.  - His next diagnostic testing will be a dobutamine stress echo (DSE) in 07/2018;.   - Return to [facility] clinic 01/2019, sooner PRN    # Hypertension.  Well controlled on his current 4 drug regimen (Amlodipine 5 qAM,diltiazem 180 qPM,lisinopril 40 qPM,chlorthalidone 25 aAM).  Even though he records his morning BPs 140-160s systolic on his phone, today and at previous visit, and recent PCP visit,  BP appears WNL  - No changes but could increase either the Amlodipine or Diltiazem in the future PRN    # Hyperlipidemia. Tolerating statin therapy - Atorvastatin 80 mg.  He was on high-fat low carb diet on 07/2017 lipid panel; more recent 10/2017 lipids look better:  Lab Results   Component Value Date    CHOL 155 11/02/2017    LDL 98.8 01/17/2018    HDL 32 (A) 11/02/2017    TRIG 119 11/02/2017    A1C 5.7 (A) 01/13/2018   - We checked direct LDL today = 98.8.  (Previous LDL range 83-100s on average)  Suggested more aggressive low-cholesterol diet or can start Zetia (as noted in subsequent MyChart message).  Since no CAV, favor more diet control efforts before starting Zetia.    # Diabetes. His PCP is actively managing his diabetes and his BGL appears to be improving. HgA1c 12/2015 was elevated to 13.5%; this week labs show he's improved to 5.7 . Continue to monitor.    # Obesity. His weight is up slightly, which he attributes to improved diabetic control. Body mass index is 35.21 kg/m??.  Reviewed importance of remaining active/exercising to help with long-term management of his weight.     # Hypothyroidism. TSH is 0.66 this month on supplementation. Continue to monitor periodically.    # Renal function. His renal function is 1.14 this week. Reassess as per routine, q 3 months, sooner PRN.    # Health maintenance.   Dental: Has dentures  Eyes: Last visit 01/2016. Intends to schedule appointment.   >Cancer Screening  CXR: Done 07/2017, normal imaging  PSA: 0.6 on 01/2018  Dermatology: Last visit sometime Feb/March 2018. Recommended annual screening for risk stratification. His physician retired, will send new referral for a new local dermatologist  Colonoscopy: Last done on 04/2017, follow up due 2028  >Endocrine  Dexa Scan/Bone Density  TSH - 0.66 01/2018, WNL (>1) previously on same 75 mcg dose.  Vitamin D- 37.2 today, previously 45.  We will not start on specific therapy except calcium will continue monitor  A1C- 5.7 01/2018  >ID/Vaccincations:   Flu Shot, did not get this year - should get fall 2019   Pneumovax given 06/2012, recommended updating at next PCP visit  Tetanus - last 2010  Shingrix - recommend this vaccine if available c/o PCP       Next testing:  DSE in 07/2018  Labs: routinely every 3 months, sooner PRN  Clinic visit: Return to [facility] clinic in May 2020    Over 40 min spent with patient over half of which was spent reviewing diagnostic testing (5 min), reviewing health maintenance records/needs (10 min), and plan of care (5 min).   Patient was also seen by Melene Muller, RN, transplant coordinator.  - Joya Gaskins, MD      History of Present Illness:  Andrew Dickerson is here for his annual heart transplant clinic visit at [facility].    To review, Andrew Dickerson is a 62 y.o. 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 His post-transplant course has been notable for only for labile DM control (hospitalization locally for DKA 12/2015).  His cardiac status has been normal/stable, no CAV, occasional single DSA with MFI >1000, and he remains on chronic dual immunosuppression of  Tacrolimus and Mycophenolate.  His cardiac transplant-related diagnostic testing is detailed below.  His last endomyocardial biopsy was 08/24/16 (ISHLT grade 1R/1A).      We last saw this patient in the [facility] clinic on 02/01/2017  for his 'annual' post-transplant clinic visit.    (He is doing overall well, with now impressive control of his diabetes (HgA1c 5.4), well-controlled BP on 4 antihypertensives, but he still could lose weight (lowest post-transplant weight was 218 lbs last year 02/2016, when HgA1c was 7.9). Thus, we encouraged more exercise for intentional weight loss (goal <220 lbs), and to maintain current good health maintenance.  )    Since his last visit, he has been doing overall well with no interim hospitalizations.  In 11/2017, he had PCP visit for cough, fever, given doxycycline x 1 week and Tessalon perles with resolution of symptoms.  Today he continues to be doing well, no new issues or complaints.  Reports he is always trying to lose weight, watching his diet (records all his food intake), goes to gym to walk 4-5 miles, 3x week MWF (goal of ~6000 steps/day on average).  He reports no issues with his diabetes which is managed by PCP.  He reports having intermittent loose/soft BM cowpie-ish which he attributes to metformin (since when he self-decreases the dose, the BMs are more formed).  He notes no dyspnea with exertion or at rest, orthopnea, PND, chest pain, palpitations, dizziness/lightheadnedness, presyncope, syncope. No nausea, vomiting, consipation.       Pertinent Post-operative Testing/History:  Post-Operative course:  His post-operative course required continued antibiotic therapy for his pre-existing LVAD driveline infection. Wound vac was placed to his abdominal incision to promote healing. Post-operatively, antihypertensive medications were slowly introduced. He underwent 2 heart biopsies as an inpatient, with the 2nd on 08/02/2012 that showed mild acute cellular rejection, ISHLT 1R/2. His immunosupressive therapy was continued and Tacrolimus was titrated to goal of 10-12.     Cardiac Diagnostic Testing:   ?? 07/18/13: LHC - no evidence of cardiac allograft vasculopathy  ??  08/06/14: LHC - no evidence of cardiac allograft vasculopathy  ?? 09/04/15: LHC - no evidence of cardiac allograft vasculopathy  ?? 08/23/16: LHC - no evidence of cardiac allograft vasculopathy   ?? 07/26/17: LHC - no evidence of cardiac allograft vasculopathy    Echo:  ?? 07/26/12: LVEF 60-65%  ?? 08/28/12: LVEF 60-65%  ?? 12/04/12: LVEF 60-65%  ?? 01/04/13: LVEF >55%  ?? 05/14/13: LVEF 60-65%  ?? 02/12/14: LVEF 60-65%  ?? 01/17/15: LVEF 65-70%  ?? 02/10/16: LVEF 60-65%  ?? 02/01/17: LVEF 65%  ?? 01/17/18: LVEF 60-65%    DSA:   ?? 08/01/12: No DSA (with MFI>1000)  ?? 08/18/12: No DSA  ?? 12/04/12: No DSA  ?? 04/09/13: No DSA  ?? 02/12/14: DR16, MFI 1499  ?? 01/07/15: DR16, MFI 1692  ?? 09/04/15: DR 16, MFI 3322  ?? 02/10/16: DR16, MFI 1644  ?? 02/01/17: DR16, MFI 1495  ?? 01/17/18: No DSA    Rejection History:   ?? 08/02/2012: Mild acute cellular rejection, ISHLT 1R/2. His immunosupressive therapy was continued and Tacrolimus has been titrated with daily levels for a current goal of 10-12.   ?? 09/11/12: showed mild rejection, ISHLT 1R/2 (One small foci of infiltrate, background is clean). His Prednisone was decreased to 12.5 mg daily. Subsequent biopsies have showed no rejection and so his immunosuppression was gradually weaned as per our practice protocol.  ?? 01/05/13: Baseline Allomap 34  ?? 02/03/13: Prednisone stopped  ?? 04/09/13: Allomap off Prednisone 33       Past Medical History:  Past Medical History:   Diagnosis Date   ??? Coronary atherosclerosis of native coronary artery 04/21/2009    12/92: CABG x 5 with LIMA to LAD and saphenous grafts to diagonal 1, obtuse marginal, distal circ and distal PDA 8/00: Repeat CABG x 3 with redo free left radial to distal LAD, saphenous to RCA, obtuse margina 2005: multiple stents placed to vein grafts and native arteries    ??? DM (diabetes mellitus) (CMS-HCC) 12/27/2012   ??? Hypothyroidism 05/07/2011   ??? LVAD (left ventricular assist device) present (CMS-HCC) 12/27/2012    05/09/09 received heart mate II    ??? Mixed hyperlipidemia 05/07/2011   ??? Obesity 12/27/2012     Past Surgical History:   Procedure Laterality Date   ??? HEART TRANSPLANT  07/18/12    CMV D-/R-   ??? LEFT VENTRICULAR ASSIST DEVICE  05/09/09   ??? PR BIOPSY OF HEART LINING N/A 09/04/2015    Procedure: Left/Right Heart Catheterization W Biopsy;  Surgeon: Lesle Reek, MD;  Location: [facility] CATH;  Service: Cardiology   ??? PR CATH PLACE/CORON ANGIO, IMG SUPER/INTERP,R&L HRT CATH, L HRT VENTRIC N/A 08/24/2016    Procedure: Left/Right Heart Catheterization W Intervention;  Surgeon: Lesle Reek, MD;  Location: [facility] CATH;  Service: Cardiology   ??? PR CATH PLACE/CORON ANGIO, IMG SUPER/INTERP,W LEFT HEART VENTRICULOGRAPHY N/A 07/26/2017    Procedure: Left Heart Catheterization;  Surgeon: Rosana Hoes, MD;  Location: [facility] CATH;  Service: Cardiology       Allergies: Patient has no known allergies.    Medications:   Current Outpatient Medications on File Prior to Visit   Medication Sig   ??? ACCU-CHEK AVIVA PLUS TEST STRP Strp CHECK BLOOD SUGAR BEFORE MEALS, AT BEDTIME, & AS NEEDED FOR HYPERGLYCEMIA, UP TO 6 TIMES A DAY   ??? amLODIPine (NORVASC) 5 MG tablet Take 1 tablet (5 mg total) by mouth daily.   ??? aspirin (ADULT LOW DOSE ASPIRIN) 81 MG  tablet Take 81 mg by mouth daily.   ??? atorvastatin (LIPITOR) 80 MG tablet Take 1 tablet (80 mg total) by mouth daily.   ??? chlorthalidone (HYGROTON) 25 MG tablet Take 1 tablet (25 mg total) by mouth daily.   ??? cholecalciferol, vitamin D3, 1,000 unit capsule Take 3,000 Units by mouth daily.   ??? diltiazem (CARDIZEM CD) 180 MG 24 hr capsule TAKE 2 CAPSULES BY MOUTH DAILY   ??? levothyroxine (SYNTHROID, LEVOTHROID) 75 MCG tablet Take 1 tablet (75 mcg total) by mouth daily.   ??? lisinopril (PRINIVIL,ZESTRIL) 40 MG tablet Take 1 tablet (40 mg total) by mouth daily.   ??? magnesium oxide 500 mg Tab Take 500 mg by mouth daily with breakfast.    ??? metFORMIN (GLUCOPHAGE) 1000 MG tablet Take 1,000 mg by mouth Two (2) times a day.   ??? mycophenolate (CELLCEPT) 250 mg capsule Take 2 capsules (500 mg total) by mouth Two (2) times a day.   ??? omeprazole (PRILOSEC) 20 MG capsule Take 1 capsule (20 mg total) by mouth daily.   ??? tamsulosin (FLOMAX) 0.4 mg capsule TAKE 1 CAPSULE (0.4 MG) BY MOUTH DAILY   ??? blood-glucose meter kit Please use to check blood sugars before meals & at bedtime.     No current facility-administered medications on file prior to visit.      Family History: Grandfather with CAD, heart attack 'early in life', unsure of age, died from MI age 13s. He has one son, age 33, who is healthy. He has a Engineer, maintenance (IT), born 06/2014.    Social History:   He's still living at home with his mother who's 50 years old right now. 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 Rare social drinking for special holiday.  No tobacco, or illicit drug use.    Review of Systems:  The balance of 10/12 systems is negative with the exception of HPI.    Physical Exam:    Vitals:    01/17/18 1348   BP: 120/62   Pulse: 87   SpO2: 99%       Wt Readings from Last 12 Encounters:   01/17/18 (!) 105.1 kg (231 lb 9.6 oz)   07/26/17 (!) 103.9 kg (229 lb)   02/01/17 (!) 105.3 kg (232 lb 3.2 oz)   08/24/16 (!) 101.6 kg (224 lb)   02/10/16 99.3 kg (218 lb 14.4 oz)   09/04/15 (!) 113.4 kg (250 lb)   01/07/15 (!) 115.7 kg (255 lb)   08/06/14 (!) 108.9 kg (240 lb)   02/12/14 (!) 109.6 kg (241 lb 9.6 oz)   07/27/13 100.7 kg (222 lb)   05/14/13 (!) 103.6 kg (228 lb 6.4 oz)   04/09/13 (!) 104.1 kg (229 lb 6.4 oz)    Body mass index is 35.21 kg/m??.    Constitutional: Good hygiene, well groomed, NAD.   HENT: Normocephalic and atraumatic.  Well hydrated moist mucous membranes of the oral cavity. God dentition.  Neck: Supple without enlargements, no thyromegaly, bruit or JVD. No cervical or supraclavicular lymphadenopathy.   Cardiovascular: Nondisplaced PMI, normal S1, S2, no MGR. Normal carotid pulses without bruits. Normal peripheral pulses.   Lungs: Chest rise symmetric.  Clear to auscultation bilaterally, without wheezes/crackles/rhonchi. Good air movement.   Skin: No rashes/breakdowns   GI: abdomen round and soft, non-tender, and non-distended. no Hepatomegaly or masses. Mid-umbilical hernia noted, soft and reducible. BS+  Extremities: No edema dependently bilaterally.  Musculo Skeletal: No joint tenderness, deformity, effusions.  Psychiatry: Pleasant, talkative.  Neurological:   Nonfocal    Labs & Imaging:  Reviewed in EPIC.   Reviewed labs from 01/27/17 from local facility  Office Visit on 01/17/2018   Component Date Value Ref Range Status   ??? EKG Ventricular Rate 01/17/2018 87  BPM Final   ??? EKG Atrial Rate 01/17/2018 87  BPM Final   ??? EKG P-R Interval 01/17/2018 146  ms Final   ??? EKG QRS Duration 01/17/2018 82  ms Final   ??? EKG Q-T Interval 01/17/2018 358  ms Final   ??? EKG QTC Calculation 01/17/2018 430  ms Final   ??? EKG Calculated P Axis 01/17/2018 10  degrees Final   ??? EKG Calculated R Axis 01/17/2018 14  degrees Final   ??? EKG Calculated T Axis 01/17/2018 25  degrees Final   ??? QTC Fredericia 01/17/2018 405  ms Final   ??? Vitamin D Total (25OH) 01/17/2018 37.2  20.0 - 80.0 ng/mL Final   ??? Donor ID 01/17/2018 [id number]   Final   ??? Donor HLA-A Antigen #1 01/17/2018 30   Final   ??? Anti-Donor HLA-A #1 MFI 01/17/2018 35  <1000 MFI Final   ??? Donor HLA-A Antigen #2 01/17/2018 74   Final   ??? Anti-Donor HLA-A #2 MFI 01/17/2018 34  <1000 MFI Final   ??? Donor HLA-B Antigen #1 01/17/2018 42   Final   ??? Anti-Donor HLA-B #1 MFI 01/17/2018 37  <1000 MFI Final   ??? Donor HLA-B Antigen #2 01/17/2018 57   Final   ??? Anti-Donor HLA-B #2 MFI 01/17/2018 105  <1000 MFI Final   ??? Donor HLA-C Antigen #1 01/17/2018 17   Final   ??? Anti-Donor HLA-C #1 MFI 01/17/2018 0  <1000 MFI Final   ??? Donor HLA-C Antigen #2 01/17/2018 18   Final   ??? Anti-Donor HLA-C #2 MFI 01/17/2018 6  <1000 MFI Final   ??? Donor HLA-DR Antigen #1 01/17/2018 18   Final   ??? Anti-Donor HLA-DR #1 MFI 01/17/2018 50  <1000 MFI Final   ??? Donor HLA-DR Antigen #2 01/17/2018 16   Final   ??? Anti-Donor HLA-DR #2 MFI 01/17/2018 18  <1000 MFI Final   ??? Donor DRw Antigen #1 01/17/2018 52   Final   ??? Anti-Donor DRw #1 MFI 01/17/2018 165  <1000 MFI Final   ??? Donor DRw Antigen #2 01/17/2018 51   Final   ??? Anti-Donor DRw #2 MFI 01/17/2018 267  <1000 MFI Final   ??? Donor HLA-DQB Antigen #1 01/17/2018 4   Final   ??? Anti-Donor HLA-DQB #1 MFI 01/17/2018 85  <1000 MFI Final   ??? Donor HLA-DQB Antigen #2 01/17/2018 5   Final   ??? Anti-Donor HLA-DQB #2 MFI 01/17/2018 27  <1000 MFI Final   ??? DSA Comment 01/17/2018 Unable to determine DP DSA status due to unavailability of donor DP HLA type.   Final   ??? HLA Class 2 Antibody Result 01/17/2018 Positive   Final   ??? HLA Class 2 Antibodies Identified 01/17/2018 DR:12  DPB1:1, 2, 18   Final   ??? HLA Class 2 Antibody Comment 01/17/2018    Final   ??? HLA Class 1 Antibody Result 01/17/2018 Negative   Final   ??? HLA Class 1 Antibody Comment 01/17/2018    Final   ??? LDL Direct 01/17/2018 98.8  60.0 - 99.0 mg/dL Final   Hospital Outpatient Visit on 01/17/2018   Component  Date Value Ref Range Status   ??? LV Diastolic Diameter PLAX 01/17/2018 3.4  cm Final   ??? LV Systolic Diameter PLAX 01/17/2018 2.0  cm Final   ??? IVS Diastolic Thickness 01/17/2018 1.1  cm Final   ??? LVPW Diastolic Thickness 01/17/2018 1.1  cm Final   ??? LVOT Diameter 01/17/2018 2.0  cm Final   ??? Aortic Root Diameter 01/17/2018 3.1  cm Final   ??? LA Systolic Diameter LX 01/17/2018 5.6  cm Final   ??? LA Area 4C View 01/17/2018 24.2  cm2 Final   ??? Aorta at Sinuses Diameter 01/17/2018 3.1  cm Final   ??? AV Peak Velocity 01/17/2018 0.8  m/s Final   ??? AV Peak Gradient 01/17/2018 2.4   Final   ??? Mitral E Point Velocity 01/17/2018 0.0  m/s Final   ??? Mitral A Point Velocity 01/17/2018 0.0  m/s Final   ??? Mitral E to A Ratio 01/17/2018 1.9   Final   ??? PV Peak Velocity 01/17/2018 1.3  m/s Final   ??? LV E' Lateral Velocity 01/17/2018 0.1  m/s Final   ??? PV peak gradient 01/17/2018 7.18  mmHg Final   ??? Echo EF Estimated 01/17/2018 65  % Final     Echo:  S/P Heart transplant  ?? Normal left ventricular systolic function, ejection fraction 60 to 65%  ?? Dilated left atrium - mild  ?? Normal right ventricular systolic function  EKG: NORMAL SINUS RHYTHM  NONSPECIFIC T WAVE ABNORMALITY  ABNORMAL ECG  WHEN COMPARED WITH ECG OF 26-Jul-2017 12:36,  NO SIGNIFICANT CHANGE WAS FOUND

## 2018-01-17 ENCOUNTER — Ambulatory Visit: Admit: 2018-01-17 | Discharge: 2018-01-17 | Payer: MEDICARE

## 2018-01-17 ENCOUNTER — Ambulatory Visit
Admit: 2018-01-17 | Discharge: 2018-01-17 | Payer: MEDICARE | Attending: Cardiovascular Disease | Primary: Cardiovascular Disease

## 2018-01-17 DIAGNOSIS — Z941 Heart transplant status: Secondary | ICD-10-CM

## 2018-01-17 DIAGNOSIS — E559 Vitamin D deficiency, unspecified: Secondary | ICD-10-CM

## 2018-01-17 DIAGNOSIS — E119 Type 2 diabetes mellitus without complications: Secondary | ICD-10-CM

## 2018-01-17 DIAGNOSIS — E669 Obesity, unspecified: Secondary | ICD-10-CM

## 2018-01-17 DIAGNOSIS — T8620 Unspecified complication of heart transplant: Principal | ICD-10-CM

## 2018-01-17 DIAGNOSIS — E785 Hyperlipidemia, unspecified: Secondary | ICD-10-CM

## 2018-01-17 DIAGNOSIS — I517 Cardiomegaly: Secondary | ICD-10-CM | POA: Diagnosis not present

## 2018-01-17 LAB — LDL CHOLESTEROL DIRECT: Cholesterol.in LDL:MCnc:Pt:Ser/Plas:Qn:Direct assay: 98.8

## 2018-01-17 NOTE — Unmapped (Addendum)
-   Your heart looks great !    - You can get the Pneumovax booster at your next primary care visit and we also recommend the shingrix vaccine ( to prevent shingles) if it is available in your area.    - Your next testing will be a Dobutamine Stress Echo     - We would like to decrease your Tacrolimus to 1 mg in the AM and 0.5 mg in the PM, will repeat labs in about 1 week.     - Aim for 7000 to 8000 steps per day     Melene Muller  RN, BSN, [facility]- Heart Transplant Coordinator  [facility]  [address]  [place], [place] [address]  p [phone] f [phone]

## 2018-01-18 LAB — CBC W/ DIFFERENTIAL
BASOPHILS RELATIVE PERCENT: 0.8 %
EOSINOPHILS ABSOLUTE COUNT: 0.1 10*9/L
EOSINOPHILS RELATIVE PERCENT: 1.3 %
HEMATOCRIT: 38.5 %
HEMOGLOBIN: 13.1 g/dL — AB (ref 13.2–17.1)
LYMPHOCYTES ABSOLUTE COUNT: 1.1 10*9/L
LYMPHOCYTES RELATIVE PERCENT: 14 %
MEAN CORPUSCULAR HEMOGLOBIN CONC: 34 g/dL
MEAN CORPUSCULAR HEMOGLOBIN: 29.9 pg
MEAN CORPUSCULAR VOLUME: 87.9 fL
MEAN PLATELET VOLUME: 10.7 fL
MONOCYTES ABSOLUTE COUNT: 0.4 10*9/L
MONOCYTES RELATIVE PERCENT: 5.5 %
NEUTROPHILS ABSOLUTE COUNT: 6.2 10*9/L
NEUTROPHILS RELATIVE PERCENT: 78.4 %
PLATELET COUNT: 277 10*9/L
RED BLOOD CELL COUNT: 4.38 10*12/L
RED CELL DISTRIBUTION WIDTH: 13.1 %
WBC ADJUSTED: 7.9 10*9/L

## 2018-01-18 LAB — EOSINOPHILS ABSOLUTE COUNT: Lab: 0.1

## 2018-01-18 LAB — ALBUMIN: Lab: 4.5

## 2018-01-18 LAB — TACROLIMUS, TROUGH: Lab: 9.1

## 2018-01-18 LAB — COMPREHENSIVE METABOLIC PANEL
ALT (SGPT): 8 U/L — AB (ref 9–46)
AST (SGOT): 9 U/L — AB (ref 10–35)
BILIRUBIN TOTAL: 0.6 mg/dL
BLOOD UREA NITROGEN: 21 mg/dL
CHLORIDE: 109 mmol/L
CREATININE: 1.14 mg/dL
GLUCOSE RANDOM: 124 mg/dL — AB (ref 65–99)
POTASSIUM: 4.5 mmol/L
PROTEIN TOTAL: 6.9 g/dL
SODIUM: 138 mmol/L

## 2018-01-18 LAB — VITAMIN D, TOTAL (25OH): Lab: 37.2

## 2018-01-18 LAB — PHOSPHORUS: Lab: 2.8

## 2018-01-18 LAB — SODIUM: Lab: 138

## 2018-01-18 LAB — MAGNESIUM: Lab: 1.3 — AB

## 2018-01-18 LAB — HEMOGLOBIN A1C: Lab: 5.7 — AB

## 2018-01-18 LAB — THYROID STIMULATING HORMONE: Lab: 0.66

## 2018-01-20 LAB — HLA DS POST TRANSPLANT
ANTI-DONOR DRW #1 MFI: 165 MFI
ANTI-DONOR DRW #2 MFI: 267 MFI
ANTI-DONOR HLA-A #1 MFI: 35 MFI
ANTI-DONOR HLA-A #2 MFI: 34 MFI
ANTI-DONOR HLA-B #1 MFI: 37 MFI
ANTI-DONOR HLA-B #2 MFI: 105 MFI
ANTI-DONOR HLA-C #2 MFI: 6 MFI
ANTI-DONOR HLA-DQB #1 MFI: 85 MFI
ANTI-DONOR HLA-DQB #2 MFI: 27 MFI
ANTI-DONOR HLA-DR #1 MFI: 50 MFI
ANTI-DONOR HLA-DR #2 MFI: 18 MFI
DONOR DRW ANTIGEN #1: 52
DONOR DRW ANTIGEN #2: 51
DONOR HLA-A ANTIGEN #1: 30
DONOR HLA-A ANTIGEN #2: 74
DONOR HLA-B ANTIGEN #1: 42
DONOR HLA-B ANTIGEN #2: 57
DONOR HLA-C ANTIGEN #1: 17
DONOR HLA-C ANTIGEN #2: 18
DONOR HLA-DQB ANTIGEN #1: 4
DONOR HLA-DQB ANTIGEN #2: 5
DONOR HLA-DR ANTIGEN #1: 18

## 2018-01-20 LAB — DSA COMMENT

## 2018-01-20 LAB — HLA CL2 AB COMMENT: Lab: 0

## 2018-01-20 LAB — FSAB CLASS 1 ANTIBODY SPECIFICITY: HLA CLASS 1 ANTIBODY RESULT: NEGATIVE

## 2018-01-20 LAB — FSAB CLASS 2 ANTIBODY SPECIFICITY

## 2018-01-20 LAB — HLA CLASS 1 ANTIBODY RESULT: Lab: NEGATIVE

## 2018-01-23 ENCOUNTER — Encounter: Payer: Self-pay | Admitting: Family Medicine

## 2018-01-23 LAB — HEMOGLOBIN A1C: Hemoglobin A1C: 5.7

## 2018-01-23 MED ORDER — PROGRAF 0.5 MG CAPSULE
ORAL_CAPSULE | 11 refills | 0 days | Status: CP
Start: 2018-01-23 — End: 2018-02-03

## 2018-01-23 MED ORDER — PROGRAF 1 MG CAPSULE
ORAL_CAPSULE | ORAL | 11 refills | 0 days
Start: 2018-01-23 — End: 2018-01-23

## 2018-01-23 MED ORDER — TACROLIMUS 1 MG CAPSULE
ORAL_CAPSULE | 11 refills | 0 days | Status: CP
Start: 2018-01-23 — End: 2018-02-03

## 2018-01-23 NOTE — Unmapped (Signed)
[facility] Refill and Clinical Coordination Note  Medication(s): PROGRAF, MYCOPHENOLATE    Andrew Dickerson, DOB: 28-Jan-1956  Phone: [phone] (home) , Alternate phone contact: N/A  Shipping address: [address]  [place] [address]  Phone or address changes today?: No  All above HIPAA information verified.  Insurance changes? No    Completed refill and clinical call assessment today to schedule patient's medication shipment from the [facility] [phone]).      MEDICATION RECONCILIATION    Confirmed the medication and dosage are correct and have not changed: Pt states they temporarily decreased tac to 1mg  am/0.5mg  pm, but did not want to send new rx because think he will go back on 1mg  bid - i will message coordinator to verify    Were there any changes to your medication(s) in the past month:  No, there are no changes reported at this time.    ADHERENCE    Is this medicine transplant or covered by Medicare Part B? Yes.    Prograf 0.5 mg   Quantity filled last month: 120   # of tablets left on hand: 13 days left      Mycophenolate Mofetil 250 mg   Quantity filled last month: 120   # of tablets left on hand: 13 days left      Did you miss any doses in the past 4 weeks? No missed doses reported.  Adherence counseling provided? Not needed     SIDE EFFECT MANAGEMENT    Are you tolerating your medication?:  Andrew Dickerson reports tolerating the medication.  Side effect management discussed: None      Therapy is appropriate and should be continued.    Evidence of clinical benefit: See Epic note from 02/01/17      FINANCIAL/SHIPPING    Delivery Scheduled: Yes, Expected medication delivery date: 02/02/18.  However, Rx request for new prescription was sent to the provider as the patient reports a change in dose.   Additional medications refilled: 11 rx total: prograf, mycophen, diltiazem, lisinopril, atorvastatin, chlorthalideon, accucheck, omeprazole, amlodipine, levothyroxine, tamsulosin    The patient will receive an FSI print out for each medication shipped and additional FDA Medication Guides as required.  Patient education from Andrew Dickerson or Andrew Dickerson may also be included in the shipment.    [name] did not have any additional questions at this time.    Delivery address validated in FSI scheduling system: Yes, address listed above is correct.      We will follow up with patient monthly for standard refill processing and delivery.      Thank you,  Thad Ranger   [facility] Specialty Pharmacist

## 2018-01-24 ENCOUNTER — Encounter: Payer: Self-pay | Admitting: Family Medicine

## 2018-01-26 DIAGNOSIS — R7989 Other specified abnormal findings of blood chemistry: Secondary | ICD-10-CM | POA: Diagnosis not present

## 2018-01-26 DIAGNOSIS — Z941 Heart transplant status: Secondary | ICD-10-CM | POA: Diagnosis not present

## 2018-01-26 DIAGNOSIS — Z79899 Other long term (current) drug therapy: Secondary | ICD-10-CM | POA: Diagnosis not present

## 2018-01-26 DIAGNOSIS — Z48298 Encounter for aftercare following other organ transplant: Secondary | ICD-10-CM | POA: Diagnosis not present

## 2018-01-31 MED FILL — DILTIAZEM HCL ER/180MG ER/CP24: DILTIAZEM HCL ER/180MG ER/CP24 | 30 days supply | Qty: 60 | Fill #6

## 2018-01-31 MED FILL — OMEPRAZOLE/20MG/CPDR: OMEPRAZOLE/20MG/CPDR | 30 days supply | Qty: 30 | Fill #2

## 2018-01-31 MED FILL — LISINOPRIL/40MG/TABS: LISINOPRIL/40MG/TABS | 30 days supply | Qty: 30 | Fill #9

## 2018-01-31 MED FILL — CHLORTHALIDONE/25MG/TABS: CHLORTHALIDONE/25MG/TABS | 30 days supply | Qty: 30 | Fill #7

## 2018-01-31 MED FILL — ATORVASTATIN CALCIUM/80MG/TABS: ATORVASTATIN CALCIUM/80MG/TABS | 30 days supply | Qty: 30 | Fill #2

## 2018-01-31 MED FILL — MYCOPHENOLATE MOFETIL/250MG/CAPS: MYCOPHENOLATE MOFETIL/250MG/CAPS | 30 days supply | Qty: 120 | Fill #2

## 2018-01-31 MED FILL — AMLODIPINE/5MG/TABS: AMLODIPINE/5MG/TABS | 30 days supply | Qty: 30 | Fill #2

## 2018-01-31 MED FILL — LEVOTHYROXINE SODIUM/75MCG/TABS: LEVOTHYROXINE SODIUM/75MCG/TABS | 30 days supply | Qty: 30 | Fill #7

## 2018-02-01 LAB — BASIC METABOLIC PANEL
BLOOD UREA NITROGEN: 29 mg/dL — AB (ref 7–25)
CALCIUM: 9.7 mg/dL
CHLORIDE: 108 mmol/L
CO2: 17 mmol/L — AB (ref 20.0–32.0)
CREATININE: 1.02 mg/dL
GLUCOSE RANDOM: 114 mg/dL
SODIUM: 137 mmol/L

## 2018-02-01 LAB — POTASSIUM: Lab: 4.7

## 2018-02-01 LAB — TACROLIMUS, TROUGH: Lab: 4.6

## 2018-02-01 MED FILL — TAMSULOSIN/0.4MG/CAP: TAMSULOSIN/0.4MG/CAP | 30 days supply | Qty: 30 | Fill #1

## 2018-02-01 MED FILL — PROGRAF/0.5MG/CAP: PROGRAF/0.5MG/CAP | 30 days supply | Qty: 30 | Fill #0

## 2018-02-01 MED FILL — ACCU-CHEK AVIVA PLUS/STRIPS/STRP: ACCU-CHEK AVIVA PLUS/STRIPS/STRP | 33 days supply | Qty: 4 | Fill #3

## 2018-02-01 MED FILL — PROGRAF/1MG/CAP: PROGRAF/1MG/CAP | 30 days supply | Qty: 30 | Fill #0

## 2018-02-02 DIAGNOSIS — R7989 Other specified abnormal findings of blood chemistry: Secondary | ICD-10-CM | POA: Diagnosis not present

## 2018-02-02 DIAGNOSIS — Z941 Heart transplant status: Secondary | ICD-10-CM | POA: Diagnosis not present

## 2018-02-02 DIAGNOSIS — Z48298 Encounter for aftercare following other organ transplant: Secondary | ICD-10-CM | POA: Diagnosis not present

## 2018-02-02 DIAGNOSIS — Z79899 Other long term (current) drug therapy: Secondary | ICD-10-CM | POA: Diagnosis not present

## 2018-02-03 MED ORDER — PROGRAF 0.5 MG CAPSULE
ORAL_CAPSULE | 11 refills | 0 days
Start: 2018-02-03 — End: 2018-02-28

## 2018-02-03 NOTE — Unmapped (Addendum)
Discussed recent labs with Andrew Market, NP.  Plan is to Make No Changes  with repeat labs in 1 Week.  Andrew Dickerson Tac dose was decreased during his clinic visit from 1 mg BID to 1 mg/0.5 mg ( 2 weeks per  His report). This is his first set of labs since that change. His labs are entered with a a time of 1608 but this seem to be an error as he has labs at 10 am. He takes his Tacrolimus between 9 and 10 am. He repeated labs yesterday 5/30, awaiting results. I had sent in for both a 1 mg capsule and a 0.5 mg capsule but Andrew Dickerson prefers using only 0.5 mg capsules as this is easier for him. Have resolved this issue. Will follow up with him when I receive most recent lab results.   Andrew Dickerson verbalized understanding & agreed with the plan.        Lab Results   Component Value Date    TACROLIMUS 4.6 01/26/2018     Goal: Tac: 5-8  Current Dose: 1 mg /0.5 mg    Lab Results   Component Value Date    BUN 29 (A) 01/26/2018    CREATININE 1.02 01/26/2018    K 4.7 01/26/2018    GLU 114 01/26/2018    MG 1.3 (A) 01/13/2018     Lab Results   Component Value Date    WBC 7.9 01/13/2018    HGB 13.1 (A) 01/13/2018    HCT 38.5 01/13/2018    PLT 277 01/13/2018    NEUTROABS 6.2 01/13/2018    EOSABS 0.1 01/13/2018

## 2018-02-08 LAB — BASIC METABOLIC PANEL
BLOOD UREA NITROGEN: 24 mg/dL
CALCIUM: 9.5 mg/dL
CHLORIDE: 108 mmol/L
CO2: 18 mmol/L — AB (ref 20.0–30.0)
CREATININE: 1.08 mg/dL
GLUCOSE RANDOM: 113 mg/dL — AB (ref 65–99)

## 2018-02-08 LAB — TACROLIMUS, TROUGH: Lab: 6.3

## 2018-02-08 LAB — CREATININE: Lab: 1.08

## 2018-02-08 NOTE — Unmapped (Signed)
Referrals for Dermatology ( Dr Hart Robinsons) and Optometry ( Dr Nedra Hai) sent today for patient. Notified patient.

## 2018-02-09 DIAGNOSIS — R7989 Other specified abnormal findings of blood chemistry: Secondary | ICD-10-CM | POA: Diagnosis not present

## 2018-02-09 DIAGNOSIS — Z48298 Encounter for aftercare following other organ transplant: Secondary | ICD-10-CM | POA: Diagnosis not present

## 2018-02-09 DIAGNOSIS — Z79899 Other long term (current) drug therapy: Secondary | ICD-10-CM | POA: Diagnosis not present

## 2018-02-09 DIAGNOSIS — Z941 Heart transplant status: Secondary | ICD-10-CM | POA: Diagnosis not present

## 2018-02-09 NOTE — Unmapped (Signed)
Discussed recent labs with Nyra Market, NP.  Plan is to Make No Changes  with repeat labs in 1 Week.  Andrew Dickerson verbalized understanding & agreed with the plan.        Lab Results   Component Value Date    TACROLIMUS 6.3 02/02/2018     Goal: Tac: 5-8  Current Dose: Tacrolimus 1 mg/0.5 mg     Lab Results   Component Value Date    BUN 24 02/02/2018    CREATININE 1.08 02/02/2018    K 4.8 02/02/2018    GLU 113 (A) 02/02/2018    MG 1.3 (A) 01/13/2018     Lab Results   Component Value Date    WBC 7.9 01/13/2018    HGB 13.1 (A) 01/13/2018    HCT 38.5 01/13/2018    PLT 277 01/13/2018    NEUTROABS 6.2 01/13/2018    EOSABS 0.1 01/13/2018

## 2018-02-12 ENCOUNTER — Other Ambulatory Visit: Payer: Self-pay | Admitting: Family Medicine

## 2018-02-12 DIAGNOSIS — R7309 Other abnormal glucose: Secondary | ICD-10-CM

## 2018-02-17 LAB — TACROLIMUS, TROUGH: Lab: 6.1

## 2018-02-17 LAB — POTASSIUM: Lab: 5.1

## 2018-02-17 LAB — BASIC METABOLIC PANEL
CALCIUM: 9.7 mg/dL
CHLORIDE: 107 mmol/L
CREATININE: 1.07 mg/dL
GLUCOSE RANDOM: 130 mg/dL — AB (ref 65–99)
POTASSIUM: 5.1 mmol/L
SODIUM: 139 mmol/L

## 2018-02-20 NOTE — Unmapped (Signed)
Discussed recent labs with Dr. Cherly Hensen.  Plan is to Make No Changes  with repeat labs in 2 Months.    Andrew Dickerson verbalized understanding & agreed with the plan.        Lab Results   Component Value Date    TACROLIMUS 6.1 02/09/2018     Goal: Tac: 5-8  Current Dose: 1 mg /0.5 mg     Lab Results   Component Value Date    BUN 21 02/09/2018    CREATININE 1.07 02/09/2018    K 5.1 02/09/2018    GLU 130 (A) 02/09/2018    MG 1.3 (A) 01/13/2018     Lab Results   Component Value Date    WBC 7.9 01/13/2018    HGB 13.1 (A) 01/13/2018    HCT 38.5 01/13/2018    PLT 277 01/13/2018    NEUTROABS 6.2 01/13/2018    EOSABS 0.1 01/13/2018

## 2018-02-28 MED ORDER — PROGRAF 0.5 MG CAPSULE
ORAL_CAPSULE | 11 refills | 0 days | Status: CP
Start: 2018-02-28 — End: 2019-03-05
  Filled 2018-05-09: qty 90, 30d supply, fill #0

## 2018-03-01 DIAGNOSIS — L821 Other seborrheic keratosis: Secondary | ICD-10-CM | POA: Diagnosis not present

## 2018-03-01 DIAGNOSIS — L570 Actinic keratosis: Secondary | ICD-10-CM | POA: Diagnosis not present

## 2018-03-01 DIAGNOSIS — D229 Melanocytic nevi, unspecified: Secondary | ICD-10-CM | POA: Diagnosis not present

## 2018-03-01 DIAGNOSIS — L814 Other melanin hyperpigmentation: Secondary | ICD-10-CM | POA: Diagnosis not present

## 2018-03-01 DIAGNOSIS — D1801 Hemangioma of skin and subcutaneous tissue: Secondary | ICD-10-CM | POA: Diagnosis not present

## 2018-03-01 NOTE — Unmapped (Signed)
[facility] Refill Coordination Note    Specialty Medication(s) to be Shipped:   Transplant: mycophenolate mofetil 250mg  and Prograf 0.5mg   Other medication(s) to be shipped: Amlodipine 5, Tamsulosin 0.4mg , Diltizem 180mg , Lisinopril 40mg , Levothyroxine , Atorvastatin 80mg , Chlorthalidone 25mg  & Omeprazole 20mg      Andrew Dickerson, DOB: Aug 30, 1956  Phone: [phone] (home)   Shipping Address: [address]  [place] [address]    All above HIPAA information was verified with patient.     Completed refill call assessment today to schedule patient's medication shipment from the [facility] [phone]).       Specialty medication(s) and dose(s) confirmed: Regimen is correct and unchanged.   Changes to medications: Dhairya reports no changes reported at this time.  Changes to insurance: No  Questions for the pharmacist: No    The patient will receive an FSI print out for each medication shipped and additional FDA Medication Guides as required.  Patient education from [place] or [name] may also be included in the shipment.    DISEASE-SPECIFIC INFORMATION        N/A    ADHERENCE     Medication Adherence    Patient reported X missed doses in the last month:  0         MEDICARE PART B DOCUMENTATION     Mycophenolate mofetil 250mg : Patient has 7 days worth of capsules on hand.  Prograf 0.5mg : Patient has 7 days worth of capsules on hand.    SHIPPING     Shipping address confirmed in FSI.     Delivery Scheduled: Yes, Expected medication delivery date: 03/07/2018 via UPS or courier.     Andrew Dickerson   [facility] Shared [facility] Specialty Technician

## 2018-03-03 MED FILL — LEVOTHYROXINE SODIUM/75MCG/TABS: LEVOTHYROXINE SODIUM/75MCG/TABS | 30 days supply | Qty: 30 | Fill #8

## 2018-03-03 MED FILL — AMLODIPINE/5MG/TABS: AMLODIPINE/5MG/TABS | 30 days supply | Qty: 30 | Fill #3

## 2018-03-03 MED FILL — TAMSULOSIN/0.4MG/CAP: TAMSULOSIN/0.4MG/CAP | 30 days supply | Qty: 30 | Fill #2

## 2018-03-03 MED FILL — PROGRAF/0.5MG/CAP: PROGRAF/0.5MG/CAP | 30 days supply | Qty: 90 | Fill #0

## 2018-03-03 MED FILL — CHLORTHALIDONE/25MG/TABS: CHLORTHALIDONE/25MG/TABS | 30 days supply | Qty: 30 | Fill #8

## 2018-03-03 MED FILL — DILTIAZEM HCL ER/180MG ER/CP24: DILTIAZEM HCL ER/180MG ER/CP24 | 30 days supply | Qty: 60 | Fill #7

## 2018-03-03 MED FILL — ATORVASTATIN CALCIUM/80MG/TABS: ATORVASTATIN CALCIUM/80MG/TABS | 30 days supply | Qty: 30 | Fill #3

## 2018-03-03 MED FILL — LISINOPRIL/40MG/TABS: LISINOPRIL/40MG/TABS | 30 days supply | Qty: 30 | Fill #10

## 2018-03-03 MED FILL — OMEPRAZOLE/20MG/CPDR: OMEPRAZOLE/20MG/CPDR | 30 days supply | Qty: 30 | Fill #3

## 2018-03-03 MED FILL — MYCOPHENOLATE MOFETIL/250MG/CAPS: MYCOPHENOLATE MOFETIL/250MG/CAPS | 30 days supply | Qty: 120 | Fill #3

## 2018-03-28 NOTE — Unmapped (Signed)
[facility] Refill Coordination Note    Specialty Medication(s) to be Shipped:   Transplant: mycophenolate mofetil 250mg  and Prograf 0.5mg   Other medication(s) to be shipped: Amlodipine 5mg , Diltizam 180mg , Lisinopril 40mg , Levothyroxine , Tamsulosin 0.4mg , Chlorthalidone 25mg , Atorvastatin 80mg  & Omeprazole 20mg      Andrew Dickerson, DOB: 1956/03/02  Phone: [phone] (home)   Shipping Address: [address]  [place] [address]    All above HIPAA information was verified with patient.     Completed refill call assessment today to schedule patient's medication shipment from the [facility] [phone]).       Specialty medication(s) and dose(s) confirmed: Regimen is correct and unchanged.   Changes to medications: Andrew Dickerson reports no changes reported at this time.  Changes to insurance: No  Questions for the pharmacist: No    The patient will receive an FSI print out for each medication shipped and additional FDA Medication Guides as required.  Patient education from [place] or [name] may also be included in the shipment.    DISEASE/MEDICATION-SPECIFIC INFORMATION        N/A    ADHERENCE     Medication Adherence    Patient reported X missed doses in the last month:  0         MEDICARE PART B DOCUMENTATION     Mycophenolate mofetil 250mg : Patient has 10 days worth of capsules on hand.  Prograf 0.5mg : Patient has 10 days worth of capsules on hand.    SHIPPING     Shipping address confirmed in FSI.     Delivery Scheduled: Yes, Expected medication delivery date: 04/05/2018 via UPS or courier.     Andrew Dickerson   [facility] Specialty Technician

## 2018-04-04 MED FILL — LISINOPRIL/40MG/TABS: LISINOPRIL/40MG/TABS | 30 days supply | Qty: 30 | Fill #11

## 2018-04-04 MED FILL — ATORVASTATIN CALCIUM/80MG/TABS: ATORVASTATIN CALCIUM/80MG/TABS | 30 days supply | Qty: 30 | Fill #4

## 2018-04-04 MED FILL — PROGRAF/0.5MG/CAP: PROGRAF/0.5MG/CAP | 30 days supply | Qty: 90 | Fill #1

## 2018-04-04 MED FILL — OMEPRAZOLE/20MG/CPDR: OMEPRAZOLE/20MG/CPDR | 30 days supply | Qty: 30 | Fill #4

## 2018-04-04 MED FILL — CHLORTHALIDONE/25MG/TABS: CHLORTHALIDONE/25MG/TABS | 30 days supply | Qty: 30 | Fill #9

## 2018-04-04 MED FILL — AMLODIPINE/5MG/TABS: AMLODIPINE/5MG/TABS | 30 days supply | Qty: 30 | Fill #4

## 2018-04-04 MED FILL — MYCOPHENOLATE MOFETIL/250MG/CAPS: MYCOPHENOLATE MOFETIL/250MG/CAPS | 30 days supply | Qty: 120 | Fill #4

## 2018-04-04 MED FILL — LEVOTHYROXINE SODIUM/75MCG/TABS: LEVOTHYROXINE SODIUM/75MCG/TABS | 30 days supply | Qty: 30 | Fill #9

## 2018-04-04 MED FILL — DILTIAZEM HCL ER/180MG ER/CP24: DILTIAZEM HCL ER/180MG ER/CP24 | 30 days supply | Qty: 60 | Fill #8

## 2018-04-04 MED FILL — TAMSULOSIN/0.4MG/CAP: TAMSULOSIN/0.4MG/CAP | 30 days supply | Qty: 30 | Fill #3

## 2018-04-28 NOTE — Unmapped (Signed)
[facility] Refill Coordination Note    Specialty Medication(s) to be Shipped:   Transplant: mycophenolate mofetil 250mg  and Prograf 0.5mg   Other medication(s) to be shipped: Levothyroxine , Atorvastatin 80mg , Chlorthalidone 25mg , Diltiazem 180mg , Lisinopril 40mg , Omeprazole 20mg , Amlodipine 5mg  & Tamsulosin 0.4mg      [name], DOB: 30-Mar-1956  Phone: [phone] (home)   Shipping Address: [address]  [place] [address]    All above HIPAA information was verified with patient.     Completed refill call assessment today to schedule patient's medication shipment from the [facility] [phone]).       Specialty medication(s) and dose(s) confirmed: Regimen is correct and unchanged.   Changes to medications: Allard reports no changes reported at this time.  Changes to insurance: No  Questions for the pharmacist: No    The patient will receive a drug information handout for each medication shipped and additional FDA Medication Guides as required.      DISEASE/MEDICATION-SPECIFIC INFORMATION        N/A    ADHERENCE     Medication Adherence    Patient reported X missed doses in the last month:  0          MEDICARE PART B DOCUMENTATION     Mycophenolate mofetil 250mg : Patient has 13 days worth of capsules on hand.  Prograf 0.5mg : Patient has 13 days worth of capsules on hand.    SHIPPING     Shipping address confirmed in Epic.     Delivery Scheduled: Yes, Expected medication delivery date: 05/10/2018 via UPS or courier.     Field Staniszewski Leodis Binet   [facility] Specialty Technician

## 2018-05-01 MED ORDER — LISINOPRIL 40 MG TABLET
ORAL_TABLET | 11 refills | 0 days | Status: CP
Start: 2018-05-01 — End: 2019-05-03
  Filled 2018-05-09: qty 30, 30d supply, fill #0

## 2018-05-09 MED FILL — DILTIAZEM CD 180 MG CAPSULE,EXTENDED RELEASE 24 HR: ORAL | 30 days supply | Qty: 60 | Fill #0

## 2018-05-09 MED FILL — OMEPRAZOLE 20 MG CAPSULE,DELAYED RELEASE: 30 days supply | Qty: 30 | Fill #0 | Status: AC

## 2018-05-09 MED FILL — ATORVASTATIN 80 MG TABLET: 30 days supply | Qty: 30 | Fill #0 | Status: AC

## 2018-05-09 MED FILL — DILTIAZEM CD 180 MG CAPSULE,EXTENDED RELEASE 24 HR: 30 days supply | Qty: 60 | Fill #0 | Status: AC

## 2018-05-09 MED FILL — ATORVASTATIN 80 MG TABLET: 30 days supply | Qty: 30 | Fill #0

## 2018-05-09 MED FILL — LEVOTHYROXINE 75 MCG TABLET: 30 days supply | Qty: 30 | Fill #0 | Status: AC

## 2018-05-09 MED FILL — OMEPRAZOLE 20 MG CAPSULE,DELAYED RELEASE: 30 days supply | Qty: 30 | Fill #0

## 2018-05-09 MED FILL — PROGRAF 0.5 MG CAPSULE: 30 days supply | Qty: 90 | Fill #0 | Status: AC

## 2018-05-09 MED FILL — AMLODIPINE 5 MG TABLET: 30 days supply | Qty: 30 | Fill #0

## 2018-05-09 MED FILL — AMLODIPINE 5 MG TABLET: 30 days supply | Qty: 30 | Fill #0 | Status: AC

## 2018-05-09 MED FILL — CHLORTHALIDONE 25 MG TABLET: 30 days supply | Qty: 30 | Fill #0 | Status: AC

## 2018-05-09 MED FILL — MYCOPHENOLATE MOFETIL 250 MG CAPSULE: 30 days supply | Qty: 120 | Fill #0 | Status: AC

## 2018-05-09 MED FILL — TAMSULOSIN 0.4 MG CAPSULE: 30 days supply | Qty: 30 | Fill #0 | Status: AC

## 2018-05-09 MED FILL — CHLORTHALIDONE 25 MG TABLET: ORAL | 30 days supply | Qty: 30 | Fill #0

## 2018-05-09 MED FILL — TAMSULOSIN 0.4 MG CAPSULE: 30 days supply | Qty: 30 | Fill #0

## 2018-05-09 MED FILL — LEVOTHYROXINE 75 MCG TABLET: ORAL | 30 days supply | Qty: 30 | Fill #0

## 2018-05-09 MED FILL — LISINOPRIL 40 MG TABLET: 30 days supply | Qty: 30 | Fill #0 | Status: AC

## 2018-05-09 MED FILL — MYCOPHENOLATE MOFETIL 250 MG CAPSULE: 30 days supply | Qty: 120 | Fill #0

## 2018-06-09 NOTE — Unmapped (Signed)
[facility] Pharm[facility] Refill Coordination Note  Medication: Mycophenolate 250mg  & Prograf 0.5mg     Unable to reach patient to schedule shipment for medication being filled at [facility]. Left voicemail on phone.  As this is the 3rd unsuccessful attempt to reach the patient, no additional phone call attempts will be made at this time.      Phone numbers attempted: [phone] (H)    Last scheduled delivery: 05/09/2018    Please call the [facility] at [phone] (option [phone]) should you have any further questions.      Thanks,  [facility] Specialty Team

## 2018-06-14 NOTE — Unmapped (Addendum)
[facility] Special[facility] Pharm[facility] Refill Coordination Note  Specialty Medication(s): mycophenolate 250mg  and Prograf 0.5mg   Additional Medications shipped: amlodipine 5mg , atorvastatin 80mg , chlorthalidone 25mg , omeprazole 20mg , tamsulosin 0.4mg , lisinopril 40mg , diltiazem CD 180mg , and levothyroxine 75 mcg    Andrew Dickerson, DOB: 1955-11-12  Phone: [phone] (home) , Alternate phone contact: N/A  Phone or address changes today?: No  All above HIPAA information was verified with patient.  Shipping Address: [address]  [place] [address]   Insurance changes? No    Completed refill call assessment today to schedule patient's medication shipment from the [facility] [phone]).      Confirmed the medication and dosage are correct and have not changed: Yes, regimen is correct and unchanged.    Confirmed patient started or stopped the following medications in the past month:  No, there are no changes reported at this time.    Are you tolerating your medication?:  Andrew Dickerson reports tolerating the medication.    ADHERENCE    Mycophenolate Mofetil 250 mg   Quantity filled last month: 120 capsules   # of capsules left on hand: 28 capsules    Prograf 0.5 mg   Quantity filled last month: 90 capsules   # of capsules left on hand: 21 capsules          Did you miss any doses in the past 4 weeks? patient reports missing 2 of his night doses in the past 4 weeks due to forgetfullness. I reinforced his use of alarms to remember taking his evening doses.      FINANCIAL/SHIPPING    Delivery Scheduled: Yes, Expected medication delivery date: 06/16/18 via UPS     The patient will receive a drug information handout for each medication shipped and additional FDA Medication Guides as required.      Jeanette did not have any additional questions at this time.    Delivery address validated in Epic.    We will follow up with patient monthly for standard refill processing and delivery.      Thank you,  [name] [facility] Shared [facility] Specialty Pharmacist

## 2018-06-15 MED FILL — LISINOPRIL 40 MG TABLET: 30 days supply | Qty: 30 | Fill #1 | Status: AC

## 2018-06-15 MED FILL — ATORVASTATIN 80 MG TABLET: 30 days supply | Qty: 30 | Fill #1

## 2018-06-15 MED FILL — AMLODIPINE 5 MG TABLET: 30 days supply | Qty: 30 | Fill #1 | Status: AC

## 2018-06-15 MED FILL — LISINOPRIL 40 MG TABLET: 30 days supply | Qty: 30 | Fill #1

## 2018-06-15 MED FILL — AMLODIPINE 5 MG TABLET: 30 days supply | Qty: 30 | Fill #1

## 2018-06-15 MED FILL — LEVOTHYROXINE 75 MCG TABLET: ORAL | 30 days supply | Qty: 30 | Fill #1

## 2018-06-15 MED FILL — TAMSULOSIN 0.4 MG CAPSULE: 30 days supply | Qty: 30 | Fill #1 | Status: AC

## 2018-06-15 MED FILL — MYCOPHENOLATE MOFETIL 250 MG CAPSULE: 30 days supply | Qty: 120 | Fill #1 | Status: AC

## 2018-06-15 MED FILL — OMEPRAZOLE 20 MG CAPSULE,DELAYED RELEASE: 30 days supply | Qty: 30 | Fill #1

## 2018-06-15 MED FILL — OMEPRAZOLE 20 MG CAPSULE,DELAYED RELEASE: 30 days supply | Qty: 30 | Fill #1 | Status: AC

## 2018-06-15 MED FILL — PROGRAF 0.5 MG CAPSULE: 30 days supply | Qty: 90 | Fill #1 | Status: AC

## 2018-06-15 MED FILL — TAMSULOSIN 0.4 MG CAPSULE: 30 days supply | Qty: 30 | Fill #1

## 2018-06-15 MED FILL — ATORVASTATIN 80 MG TABLET: 30 days supply | Qty: 30 | Fill #1 | Status: AC

## 2018-06-15 MED FILL — MYCOPHENOLATE MOFETIL 250 MG CAPSULE: 30 days supply | Qty: 120 | Fill #1

## 2018-06-15 MED FILL — CHLORTHALIDONE 25 MG TABLET: 30 days supply | Qty: 30 | Fill #1 | Status: AC

## 2018-06-15 MED FILL — PROGRAF 0.5 MG CAPSULE: 30 days supply | Qty: 90 | Fill #1

## 2018-06-15 MED FILL — DILTIAZEM CD 180 MG CAPSULE,EXTENDED RELEASE 24 HR: ORAL | 30 days supply | Qty: 60 | Fill #1

## 2018-06-15 MED FILL — CHLORTHALIDONE 25 MG TABLET: ORAL | 30 days supply | Qty: 30 | Fill #1

## 2018-06-15 MED FILL — DILTIAZEM CD 180 MG CAPSULE,EXTENDED RELEASE 24 HR: 30 days supply | Qty: 60 | Fill #1 | Status: AC

## 2018-06-15 MED FILL — LEVOTHYROXINE 75 MCG TABLET: 30 days supply | Qty: 30 | Fill #1 | Status: AC

## 2018-06-28 NOTE — Unmapped (Signed)
Called Mr. Deloatch re:  Testing and appointment scheduling.  Confirmed with Mr. Zane that 11/13 would work for him.  Tests and appointment have been scheduled.

## 2018-07-10 MED ORDER — CHLORTHALIDONE 25 MG TABLET
ORAL | 11 refills | 0 days | Status: CP
Start: 2018-07-10 — End: 2019-07-10
  Filled 2018-07-13: qty 30, 30d supply, fill #0

## 2018-07-10 MED ORDER — LEVOTHYROXINE 75 MCG TABLET
ORAL_TABLET | ORAL | 11 refills | 0 days | Status: CP
Start: 2018-07-10 — End: 2019-07-10
  Filled 2018-07-13: qty 30, 30d supply, fill #0

## 2018-07-10 NOTE — Unmapped (Signed)
[facility] Pharm[facility] Refill and Clinical Coordination Note  Medication(s): Prograf, Mycophenolate    Andrew Dickerson, DOB: 04-08-1956  Phone: [phone] (home) , Alternate phone contact: N/A  Shipping address: [address]  [place] [address]  Phone or address changes today?: No  All above HIPAA information verified.  Insurance changes? No    Completed refill and clinical call assessment today to schedule patient's medication shipment from the [facility] [phone]).      MEDICATION RECONCILIATION    Confirmed the medication and dosage are correct and have not changed: Yes, regimen is correct and unchanged.    Were there any changes to your medication(s) in the past month:  No, there are no changes reported at this time.    ADHERENCE    Is this medicine transplant or covered by Medicare Part B? Yes.    Mycophenolate mofetil 250mg : Patient has 10 days worth of  capsules on hand.  Prograf 0.5mg : Patient has 10 days worth of capsules on hand.    Did you miss any doses in the past 4 weeks? Yes.  Andrew Dickerson reports missing his night dose on several days of medication therapy in the last 4 weeks.  Andrew Dickerson reports forgot to take it as the cause of their non-adherance.  Adherence counseling provided? Yes, discussed the following ways to help with adherence: Pill Box, Alarm reminders.     SIDE EFFECT MANAGEMENT    Are you tolerating your medication?:  Andrew Dickerson reports tolerating the medication.  Side effect management discussed: None      Therapy is appropriate and should be continued.    Evidence of clinical benefit: See Epic note from 01/17/18      FINANCIAL/SHIPPING    Delivery Scheduled: Yes, Expected medication delivery date: 07/13/18     Medication will be delivered via UPS to the home address in [place].    Additional medications refilled: Amlodipine, Atorvastatin,Chlorthalidone,Diltiazem,Levothyroxine,Lisinopril, Omeprazole,Tamsulosin    The patient will receive a drug information handout for each medication shipped and additional FDA Medication Guides as required.      Ann did not have any additional questions at this time.    Delivery address confirmed in Epic.     We will follow up with patient monthly for standard refill processing and delivery.      Thank you,  [name]   [facility] Specialty Pharmacist

## 2018-07-13 MED FILL — LEVOTHYROXINE 75 MCG TABLET: 30 days supply | Qty: 30 | Fill #0 | Status: AC

## 2018-07-13 MED FILL — DILTIAZEM CD 180 MG CAPSULE,EXTENDED RELEASE 24 HR: ORAL | 30 days supply | Qty: 60 | Fill #2

## 2018-07-13 MED FILL — OMEPRAZOLE 20 MG CAPSULE,DELAYED RELEASE: 30 days supply | Qty: 30 | Fill #2 | Status: AC

## 2018-07-13 MED FILL — LISINOPRIL 40 MG TABLET: 30 days supply | Qty: 30 | Fill #2 | Status: AC

## 2018-07-13 MED FILL — TAMSULOSIN 0.4 MG CAPSULE: 30 days supply | Qty: 30 | Fill #2 | Status: AC

## 2018-07-13 MED FILL — AMLODIPINE 5 MG TABLET: 30 days supply | Qty: 30 | Fill #2

## 2018-07-13 MED FILL — TAMSULOSIN 0.4 MG CAPSULE: 30 days supply | Qty: 30 | Fill #2

## 2018-07-13 MED FILL — CHLORTHALIDONE 25 MG TABLET: 30 days supply | Qty: 30 | Fill #0 | Status: AC

## 2018-07-13 MED FILL — ATORVASTATIN 80 MG TABLET: 30 days supply | Qty: 30 | Fill #2

## 2018-07-13 MED FILL — PROGRAF 0.5 MG CAPSULE: 30 days supply | Qty: 90 | Fill #2

## 2018-07-13 MED FILL — LISINOPRIL 40 MG TABLET: 30 days supply | Qty: 30 | Fill #2

## 2018-07-13 MED FILL — PROGRAF 0.5 MG CAPSULE: 30 days supply | Qty: 90 | Fill #2 | Status: AC

## 2018-07-13 MED FILL — ATORVASTATIN 80 MG TABLET: 30 days supply | Qty: 30 | Fill #2 | Status: AC

## 2018-07-13 MED FILL — MYCOPHENOLATE MOFETIL 250 MG CAPSULE: 30 days supply | Qty: 120 | Fill #2

## 2018-07-13 MED FILL — MYCOPHENOLATE MOFETIL 250 MG CAPSULE: 30 days supply | Qty: 120 | Fill #2 | Status: AC

## 2018-07-13 MED FILL — DILTIAZEM CD 180 MG CAPSULE,EXTENDED RELEASE 24 HR: 30 days supply | Qty: 60 | Fill #2 | Status: AC

## 2018-07-13 MED FILL — AMLODIPINE 5 MG TABLET: 30 days supply | Qty: 30 | Fill #2 | Status: AC

## 2018-07-13 MED FILL — OMEPRAZOLE 20 MG CAPSULE,DELAYED RELEASE: 30 days supply | Qty: 30 | Fill #2

## 2018-07-14 DIAGNOSIS — Z79899 Other long term (current) drug therapy: Secondary | ICD-10-CM | POA: Diagnosis not present

## 2018-07-14 DIAGNOSIS — Z125 Encounter for screening for malignant neoplasm of prostate: Secondary | ICD-10-CM | POA: Diagnosis not present

## 2018-07-14 DIAGNOSIS — E559 Vitamin D deficiency, unspecified: Secondary | ICD-10-CM | POA: Diagnosis not present

## 2018-07-14 DIAGNOSIS — Z48298 Encounter for aftercare following other organ transplant: Secondary | ICD-10-CM | POA: Diagnosis not present

## 2018-07-14 DIAGNOSIS — E785 Hyperlipidemia, unspecified: Secondary | ICD-10-CM | POA: Diagnosis not present

## 2018-07-14 DIAGNOSIS — R7989 Other specified abnormal findings of blood chemistry: Secondary | ICD-10-CM | POA: Diagnosis not present

## 2018-07-14 DIAGNOSIS — Z941 Heart transplant status: Secondary | ICD-10-CM | POA: Diagnosis not present

## 2018-07-14 LAB — HEMOGLOBIN A1C: Hemoglobin A1C: 5.9

## 2018-07-19 ENCOUNTER — Ambulatory Visit: Payer: Medicare HMO | Admitting: Family Medicine

## 2018-07-19 ENCOUNTER — Ambulatory Visit: Admit: 2018-07-19 | Discharge: 2018-07-20 | Payer: MEDICARE

## 2018-07-19 DIAGNOSIS — Z941 Heart transplant status: Principal | ICD-10-CM

## 2018-07-19 DIAGNOSIS — Z48298 Encounter for aftercare following other organ transplant: Principal | ICD-10-CM

## 2018-07-19 DIAGNOSIS — Z7982 Long term (current) use of aspirin: Secondary | ICD-10-CM | POA: Diagnosis not present

## 2018-07-19 DIAGNOSIS — N401 Enlarged prostate with lower urinary tract symptoms: Secondary | ICD-10-CM | POA: Diagnosis not present

## 2018-07-19 DIAGNOSIS — R350 Frequency of micturition: Secondary | ICD-10-CM | POA: Diagnosis not present

## 2018-07-19 DIAGNOSIS — E785 Hyperlipidemia, unspecified: Secondary | ICD-10-CM | POA: Diagnosis not present

## 2018-07-19 DIAGNOSIS — I10 Essential (primary) hypertension: Secondary | ICD-10-CM | POA: Diagnosis not present

## 2018-07-19 DIAGNOSIS — E119 Type 2 diabetes mellitus without complications: Secondary | ICD-10-CM | POA: Diagnosis not present

## 2018-07-19 DIAGNOSIS — Z5181 Encounter for therapeutic drug level monitoring: Secondary | ICD-10-CM | POA: Diagnosis not present

## 2018-07-19 DIAGNOSIS — K219 Gastro-esophageal reflux disease without esophagitis: Secondary | ICD-10-CM | POA: Diagnosis not present

## 2018-07-19 MED ORDER — DILTIAZEM CD 360 MG CAPSULE,EXTENDED RELEASE 24 HR
ORAL_CAPSULE | Freq: Every day | ORAL | 3 refills | 0 days | Status: CP
Start: 2018-07-19 — End: 2018-07-21

## 2018-07-19 MED ORDER — FAMOTIDINE 20 MG TABLET
ORAL_TABLET | Freq: Two times a day (BID) | ORAL | 3 refills | 0 days | Status: CP
Start: 2018-07-19 — End: 2019-07-19
  Filled 2018-07-20: qty 180, 90d supply, fill #0

## 2018-07-19 MED ORDER — ASPIRIN 81 MG TABLET,DELAYED RELEASE
ORAL_TABLET | Freq: Every day | ORAL | 3 refills | 90.00000 days | Status: CP
Start: 2018-07-19 — End: ?
  Filled 2018-07-20: qty 90, 90d supply, fill #0

## 2018-07-19 MED ORDER — AMLODIPINE 10 MG TABLET
ORAL_TABLET | Freq: Every day | ORAL | 3 refills | 90.00000 days | Status: CP
Start: 2018-07-19 — End: 2019-08-07
  Filled 2018-08-07: qty 90, 90d supply, fill #0

## 2018-07-19 MED ORDER — MAGNESIUM OXIDE 500 MG TABLET
ORAL_TABLET | Freq: Every day | ORAL | 3 refills | 0.00000 days | Status: CP
Start: 2018-07-19 — End: 2019-02-21

## 2018-07-19 MED ORDER — MYCOPHENOLATE MOFETIL 500 MG TABLET
ORAL_TABLET | Freq: Two times a day (BID) | ORAL | 3 refills | 90 days | Status: CP
Start: 2018-07-19 — End: 2019-07-19
  Filled 2018-08-07: qty 180, 90d supply, fill #0

## 2018-07-19 NOTE — Unmapped (Signed)
[facility] PHARMACY NOTE  07/19/2018   KORTNEY SCHOENFELDER  [id number]    Medication changes today:   1. Discontinued omeprazole  2. Initiated Famotidine 20 mg BID  3. Consolidate diltiazem from 2 x 180 mg capsules to 1 x 360 mg capsule to reduce pill burden  4. Consolidate mycophenolate to the 500 mg tablets to reduce pill burden  5. Discontinued tamsulosin  6. Increase amlodipine to 10 mg daily    Education/Adherence tools provided today:  1.provided updated medication list  2. discussed adherence reminder tools such as cell phone alarms  3.  facilitated medication access for Aspirin and Magnesium Oxide    Follow up items:  1. Urinary frequency, urgency, and other symptoms off tamsulosin  2. Any signs/symptoms of orthostasis with increase of amlodipine  3. GERD symptoms with switch from PPI to H2RA    Next visit with pharmacy in [place]  ____________________________________________________________________    [name] is a 62 y.o. 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 Patient was maintained on LVAD support prior to transplantation.    Other PMH significant for hypertension, HLD, Diabetes, BPH    Seen by pharmacy today for: adherence education and medication management; last seen by pharmacy never    CC:  Patient has no complaints today     There were no vitals filed for this visit.    No Known Allergies    All medications reviewed and updated. Medication list includes revisions made during today???s encounter    Outpatient Encounter Medications as of 07/19/2018   Medication Sig Dispense Refill   ??? amLODIPine (NORVASC) 5 MG tablet TAKE 1 TABLET (5 MG TOTAL) BY MOUTH DAILY. 30 tablet 11   ??? aspirin (ADULT LOW DOSE ASPIRIN) 81 MG tablet Take 81 mg by mouth daily.     ??? atorvastatin (LIPITOR) 80 MG tablet TAKE 1 TABLET (80 MG TOTAL) BY MOUTH DAILY. 30 tablet 11   ??? blood-glucose meter kit Please use to check blood sugars before meals & at bedtime. 1 each 0 ??? chlorthalidone (HYGROTON) 25 MG tablet TAKE 1 TABLET BY MOUTH ONCE DAILY 30 each 11   ??? cholecalciferol, vitamin D3, 1,000 unit capsule Take 3,000 Units by mouth daily.     ??? diltiazem (CARDIZEM CD) 180 MG 24 hr capsule TAKE 2 CAPSULES BY MOUTH DAILY 60 capsule 11   ??? levothyroxine (SYNTHROID, LEVOTHROID) 75 MCG tablet TAKE 1 TABLET BY MOUTH ONCE DAILY 30 tablet 11   ??? lisinopril (PRINIVIL,ZESTRIL) 40 MG tablet TAKE 1 TABLET (40 MG) BY MOUTH ONCE DAILY 30 tablet 11   ??? magnesium oxide 500 mg Tab Take 500 mg by mouth daily with breakfast.      ??? metFORMIN (GLUCOPHAGE) 1000 MG tablet Take 1,000 mg by mouth Two (2) times a day.     ??? mycophenolate (CELLCEPT) 250 mg capsule TAKE 2 CAPSULES (500MG ) BY MOUTH TWICE DAILY 120 capsule 11   ??? omeprazole (PRILOSEC) 20 MG capsule TAKE 1 CAPSULE (20 MG TOTAL) BY MOUTH DAILY. 30 capsule 11   ??? PROGRAF 0.5 mg capsule Take two capsules ( 1 mg ) in the morning and one capsule ( 0.5 mg ) in the evening 90 capsule 11   ??? tamsulosin (FLOMAX) 0.4 mg capsule TAKE 1 CAPSULE (0.4 MG) BY MOUTH DAILY 30 capsule 11     Facility-Administered Encounter Medications as of 07/19/2018   Medication Dose Route Frequency Provider Last Rate Last Dose   ??? [COMPLETED] DOBUTamine  1,000 mg in dextrose 5% 250 ml (4,000 mcg/ml) infusion PMB   Intravenous Code/Trauma/Sedation Continuous Med Armando Reichert, MD   Stopped at 07/19/18 0857   ??? [COMPLETED] metoprolol (LOPRESSOR) injection   Intravenous Code/Trauma/Sedation Med Armando Reichert, MD   2.5 mg at 07/19/18 0981       Induction agent : None    CURRENT IMMUNOSUPPRESSION: tacrolimus 1 mg qAM, 0.5 mg qPM PO prograf goal: 5-8   mycophenolate 500 mg PO BID   steroid free     Patient Has minor tremor, but does not report it to be bothersome and has not changed significantly at different doses of tacrolimus    IMMUNOSUPPRESSION DRUG LEVELS:  Lab Results   Component Value Date    Tacrolimus, Trough 6.1 02/09/2018    Tacrolimus, Trough 6.3 02/02/2018 Tacrolimus, Trough 4.6 01/26/2018    Tacrolimus, Trough 7.6 11/29/2014    Tacrolimus, Trough 9.2 07/24/2014    Tacrolimus, Trough 9.0 06/05/2014     Tac level from outside labs 6.6  Prograf level is accurate 12 hour trough    Graft function: stable  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 CMV prophylaxis with valganciclovir 900 mg daily, complete in 2013.  PCP: Prophylaxis with bactrim DS 1 tab MWF x 6 months, complete  Thrush: completed  Patient has completed prophylaxis    Plan: prophylaxis completed    CAV Prophylaxis:   Aspirin: asa 81 mg , stopped taking this last week as he ran out and hasn't been back to the store  Statin: atorvastatin; currently 80 mg.  Misc: diltiazem currently on 360 mg/d  Most recent fasting lipid panel improved from previous ldl of 98.  TC 141, HDL 32, LDL 85.  Plan: Will consolidate his diltiazem from two 180 mg capsules to a single 360 mg capsule, also sent new prescription to [facility] for ASA 81 mg to make it more convenient for him to remain adherent.    BP: Goal < 140/90. Encounter vitals reported above  Home BP ranges: Systolics consistently in 150's-160's with diastolics in the 70's from home logs, this matches with clinic vitals.  Current meds include: amlodipine 5 mg qAM, diltiazem 360 mg qPM, lisinopril 40 mg qPM, chlorthalidone 25 mg qAM  Plan: out of goal Increase amlodipine to 10 mg qAM. Continue to monitor    Anemia:  H/H:   Lab Results   Component Value Date    HGB 13.1 (A) 01/13/2018     Lab Results   Component Value Date    HCT 38.5 01/13/2018     Iron panel:  Lab Results   Component Value Date    IRON 90 06/09/2012    TIBC 336 06/09/2012    FERRITIN 511 (H) 06/09/2012 Lab Results   Component Value Date    Iron Saturation (%) 27 06/09/2012       Outpatient labs H/H 13.9/41.9  Plan: within goal. Continue to monitor.     DM:   Lab Results   Component Value Date    A1C 5.7 (A) 01/13/2018   External lab A1C 5.9%  Goal A1c < 7  Currently on: Metformin 1000 mg  PO BID  Home BS [id number] levels consistently in the 110's-130's, and mealtime levels do not exceed 180   Diet: Irregular can vary from salads, to sandwiches, to cheese burgers  Exercise: Formerly was walking 4-5 miles x 3 a week, but recently rolled his ankle which has limited his ability to be active.  Is planning on resuming exercise shortly.  Hypoglycemia: no  Plan:  His PCP is managing his diabetes, within goal will continue to monitor    Electrolytes: Mag is low at 1.2  Meds currently on: Magnesium Oxide 500 mg/d, however patient ran out last week and did not purchase more medication hoping his levels would be good today and he could stop therapy  Plan: Resume magnesium oxide 500 mg/d and sent prescription to [facility] to facilitate ease of access    GI: Reports a bowel movement every other day which is normal for him, no signs/symptoms of GERD except when eating spicy foods  Meds currently on: Omeprazole 20 mg/d  Plan: Given low mag discussed earlier as well as potential long term side effects of ppi use will trial a switch from omeprazole to famotidine 20 mg PO BID.      Pain: Had recent ankle pain from twisting it while walking  Meds currently on: None  Plan: Continue to Monitor    Bone health:   Patient currently off steroid therapy  Vitamin D Level: last level is 44 Goal > 30.   Last DEXA results: N/A  Current meds include: Cholecalciferol 3000IU/d  Plan: Within goal. Continue to monitor.     Women's/Men's Health:  KIMBER ESTERLY is a 62 y.o. male. Patient reports no men's/women's health issues  Plan: Continue to monitor    Hypothyroidism  Last TSH 1.2 on 07/15/18  Meds currently on: Levothyroxine 75 mcg/d  Plan: Within goal continue to monitor    BPH  Patient reports no urinary urgency, incontinence or increase frequency, he does not believe he needs this medication as it was started immediately after transplant when he had difficulty voiding and has not been revisited since.  Most recent PSA 0.6 on 07/15/18  Meds currently on: Tamsulosin 0.4 mg/d  Plan: Will stop tamsulosin to simplify medication regimen and reduce risk of orthostasis considering we are also titrating his antihypertensives.  Instructed patient to contact clinic coordinator if he notices urinary symptoms.      Adherence: Patient has excellent understanding of medications.  Patient  does fill their own pill box on a regular basis at home.  Patient brought medication card:Utilizes an app on his phone to track and remind him to take medications  Pill box:did not bring  Patient requested refills for the following meds: None  Corrections needed in Epic medication list: None  Plan: Will send prescriptions for ASA and MagOx to [facility] to facilitate access; provided moderate adherence counseling/intervention      Spent approximately 30 minutes on educating this patient and greater than 50% was spent in direct face to face counseling regarding post transplant medication education. Questions and concerns were address to patient's satisfaction.    During this visit, the following was completed:   BG log data assessment  BP log data assessment  Labs ordered and evaluated  complex treatment plan >1 DS   Patient education was completed for 25-60 minutes     All questions/concerns were addressed to the patient's satisfaction.    Dennie Bible, PharmD  PGY-1 Ambulatory Care Pharmacy Resident  __________________________________________  PATIENT SEEN AND EVALUATED BY:  Wendell Nicoson TEETER Vanessia Bokhari, PHARMD, BCPS, CPP  [employer] TRANSPLANT PHARMACIST PRACTITIONER  PAGER [phone]

## 2018-07-19 NOTE — Unmapped (Signed)
It was very nice to meet you today! Below is a list of the changes we are going to make today. All of these medications will be coming from [facility]. If you have any issues, please call [place].    [ ]  discontinue omeprazole (Prilosec); begin taking famotidine (Pepcid) 20mg  (1 tablet) by mouth twice a day. If you notice significantly more heartburn after a week, please call [place] and we can put you back on Prilosec.    [ ]  We are going to increase your amlodipine to 10mg  every day. You can take 2, 5mg  tablets until the 10mg  arrive from [facility]. If you notice more swelling in your legs or dizziness/lightheadedness, please call [place]. Be sure to check your blood pressure daily.    [ ]  Please restart taking your aspirin 81mg  daily and magnesium oxide 500mg  daily.     [ ]  We are going to hold your tamsulosin (Flomax). Please call [place] if you notice any trouble with your urine stream, increased urine frequency, or trouble emptying your bladder. We can always restart this medication.     [ ]  We are going to change you over to 1, 500mg  mycophenolate tablet and 1, 360mg  diltiazem capsule. Please note that your dose is no different, you will just take less pills to get the same dose. Please be srue to check your labels well when the new medications arrive to ensure you're taking the right amount.

## 2018-07-19 NOTE — Unmapped (Signed)
Pt tolerated procedure well, VSS.

## 2018-07-20 MED FILL — ASPIRIN 81 MG TABLET,DELAYED RELEASE: 90 days supply | Qty: 90 | Fill #0 | Status: AC

## 2018-07-20 MED FILL — FAMOTIDINE 20 MG TABLET: 90 days supply | Qty: 180 | Fill #0 | Status: AC

## 2018-07-20 NOTE — Unmapped (Signed)
[facility] Specialty Pharmacy Refill and Clinical Coordination Note   Medication(s): Mycophenolate 500mg     Spoke to Mr. Sohn about the change of his mycophenolate 250mg  2 capsules two times a day to mycophenolate 500mg  1 capsule two times a day.  Also spoke about the change on his diltiazem 180mg  2 capsules daily changing to diltiazem 360mg  1 capsule daily.  He stated he was aware of the changes and was going to finish up the old prescriptions first.        ATHARV BARRIERE, DOB: [birth year]  Phone: [phone] (home) , Alternate phone contact: N/A  Shipping address: [address]  [place] [address]  Phone or address changes today?: No  All above HIPAA information verified.  Insurance changes? No    Completed refill and clinical call assessment today to schedule patient's medication shipment from the [facility] [phone]).      MEDICATION RECONCILIATION    Confirmed the medication and dosage are correct and have not changed: Yes, regimen is correct and unchanged.    Were there any changes to your medication(s) in the past month:  No, there are no changes reported at this time.    ADHERENCE    Is this medicine transplant or covered by Medicare Part B? Yes.    Mycophenolate mofetil 500mg : Patient has more than 2 weeks of mycophenolate 250mg  and will finish up before beginning the mycophenolate 500mg  tablets on hand.    Did you miss any doses in the past 4 weeks? No missed doses reported.  Adherence counseling provided? Not needed     SIDE EFFECT MANAGEMENT    Are you tolerating your medication?:  Weslie reports tolerating the medication.  Side effect management discussed: None      Therapy is appropriate and should be continued.    Evidence of clinical benefit: See Epic note from 01/17/18      [facility]    Delivery Scheduled: Patient declined refill at this time due to He has more than 2 weeks of Mycophenolate 250mg  on hand.     Medication will be delivered via UPS to the home address in [facility].    Additional medications refilled: No additional medications/refills needed at this time.    The patient will receive a drug information handout for each medication shipped and additional FDA Medication Guides as required.      Mckinnon did not have any additional questions at this time.    Delivery address confirmed in Epic.     We will follow up with patient monthly for standard refill processing and delivery.      Thank you,  [name]   [facility] Specialty Pharmacist

## 2018-07-21 MED ORDER — DILTIAZEM CD 180 MG CAPSULE,EXTENDED RELEASE 24 HR
ORAL_CAPSULE | 11 refills | 0 days | Status: CP
Start: 2018-07-21 — End: ?
  Filled 2018-08-07: qty 60, 30d supply, fill #0

## 2018-07-27 LAB — COMPREHENSIVE METABOLIC PANEL
ALKALINE PHOSPHATASE: 62 U/L
ALT (SGPT): 16 U/L
AST (SGOT): 12 U/L
BILIRUBIN TOTAL: 0.6 mg/dL
BLOOD UREA NITROGEN: 919 mg/dL
CALCIUM: 9.5 mg/dL
CHLORIDE: 106 mmol/L
CO2: 21 mmol/L
CREATININE: 1.18 mg/dL
GLUCOSE RANDOM: 114 mg/dL — AB (ref 65–99)
PROTEIN TOTAL: 6.6 g/dL
SODIUM: 137 mmol/L

## 2018-07-27 LAB — THYROID STIMULATING HORMONE: Lab: 1.2

## 2018-07-27 LAB — CBC W/ DIFFERENTIAL
BASOPHILS ABSOLUTE COUNT: 0 10*9/L
BASOPHILS RELATIVE PERCENT: 0.7 %
EOSINOPHILS RELATIVE PERCENT: 1.7 %
HEMATOCRIT: 41.9 %
HEMOGLOBIN: 13.9 g/dL
LYMPHOCYTES ABSOLUTE COUNT: 1.1 10*9/L
LYMPHOCYTES RELATIVE PERCENT: 13.5 %
MEAN CORPUSCULAR HEMOGLOBIN CONC: 33.5 g/dL
MEAN CORPUSCULAR HEMOGLOBIN: 29.5 pg
MEAN CORPUSCULAR VOLUME: 89 fL
MEAN PLATELET VOLUME: 10.3 fL
MONOCYTES ABSOLUTE COUNT: 0.5 10*9/L
MONOCYTES RELATIVE PERCENT: 6.1 %
NEUTROPHILS ABSOLUTE COUNT: 6.3 10*9/L
NEUTROPHILS RELATIVE PERCENT: 78 %
PLATELET COUNT: 278 10*9/L
RED BLOOD CELL COUNT: 4.71 10*12/L
RED CELL DISTRIBUTION WIDTH: 13 %
WHITE BLOOD CELL COUNT: 8.1 10*9/L

## 2018-07-27 LAB — VIT D3 (25OH): Lab: 0

## 2018-07-27 LAB — LIPID PANEL
CHOLESTEROL/HDL RATIO SCREEN: 4.4
HDL CHOLESTEROL: 32 mg/dL — AB (ref >=40)
LDL CHOLESTEROL CALCULATED: 85 mg/dL
NON-HDL CHOLESTEROL: 109 mg/dL
TRIGLYCERIDES: 142 mg/dL

## 2018-07-27 LAB — ALBUMIN: Lab: 4.5

## 2018-07-27 LAB — CREATININE: Lab: 1.18

## 2018-07-27 LAB — PROSTATE SPECIFIC ANTIGEN: Lab: 0.6

## 2018-07-27 LAB — HEMOGLOBIN A1C: Lab: 5.9

## 2018-07-27 LAB — PHOSPHORUS: Lab: 2.6

## 2018-07-27 LAB — VITAMIN D 25 HYDROXY: VITAMIN D, TOTAL (25OH): 44 ng/mL

## 2018-07-27 LAB — HEMATOCRIT: Lab: 41.9

## 2018-07-27 LAB — TACROLIMUS, TROUGH: Lab: 6.5

## 2018-07-27 LAB — NON-HDL CHOLESTEROL: Lab: 109

## 2018-07-27 LAB — MAGNESIUM: Lab: 1.2 — AB

## 2018-07-31 NOTE — Unmapped (Signed)
Reviewed with Dr. 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 Vitamin D Total (25OH) 07/14/2018 44  ng/mL Final   ??? Tacrolimus, Trough 07/14/2018 6.5  5.0 - 15.0 ng/mL Final   ??? PSA 07/14/2018 0.60  ng/mL Final   ??? WBC 07/14/2018 8.1  10*9/L Final   ??? RBC 07/14/2018 4.71  10*12/L Final   ??? HGB 07/14/2018 13.9  g/dL Final   ??? HCT 16/06/9603 41.9  % Final   ??? MCV 07/14/2018 89.0  fL Final   ??? MCH 07/14/2018 29.5  pg Final   ??? MCHC 07/14/2018 33.5  g/dL Final   ??? RDW 54/05/8118 13.0  % Final   ??? MPV 07/14/2018 10.3  fL Final   ??? Platelet 07/14/2018 278  10*9/L Final   ??? Neutrophils % 07/14/2018 78.0  % Final   ??? Lymphocytes % 07/14/2018 13.5  % Final   ??? Monocytes % 07/14/2018 6.1  % Final   ??? Eosinophils % 07/14/2018 1.7  % Final   ??? Basophils % 07/14/2018 0.7  % Final   ??? Absolute Neutrophils 07/14/2018 6.3  10*9/L Corrected   ??? Absolute Lymphocytes 07/14/2018 1.1  10*9/L Corrected   ??? Absolute Monocytes 07/14/2018 0.5  10*9/L Corrected   ??? Absolute Eosinophils 07/14/2018 0.1  10*9/L Corrected   ??? Absolute Basophils 07/14/2018 0.0  10*9/L Corrected   ??? TSH 07/14/2018 1.200  uIU/mL Final   ??? Magnesium 07/14/2018 1.2* 1.5 - 2.5 mg/dL Final   ??? Phosphorus 07/14/2018 2.6  mg/dL Final   ??? Hemoglobin A1C 07/14/2018 5.9  % Final   ??? Sodium 07/14/2018 137  mmol/L Final   ??? Potassium 07/14/2018 4.3  mmol/L Final   ??? Chloride 07/14/2018 106  mmol/L Final   ??? CO2 07/14/2018 21.0  mmol/L Final   ??? BUN 07/14/2018 919  mg/dL Final   ??? Creatinine 07/14/2018 1.18  mg/dL Final   ??? Glucose 14/78/2956 114* 65 - 99 mg/dL Final   ??? Calcium 21/30/8657 9.5  mg/dL Final   ??? Total Protein 07/14/2018 6.6  g/dL Final   ??? Total Bilirubin 07/14/2018 0.6  mg/dL Final   ??? AST 84/69/6295 12  U/L Final   ??? ALT 07/14/2018 16  U/L Final   ??? Alkaline Phosphatase 07/14/2018 62  U/L Final   ??? Albumin 07/14/2018 4.5  g/dL Final   ??? Cholesterol 07/14/2018 141  mg/dL Corrected   ??? Triglycerides 07/14/2018 142  mg/dL Corrected   ??? HDL 07/14/2018 32* 40 mg/dL Corrected   ??? LDL Calculated 07/14/2018 85  mg/dL Corrected   ??? Non-HDL Cholesterol 07/14/2018 109  mg/dL Corrected   ??? Chol/HDL Ratio 07/14/2018 4.4   Corrected   Hospital Outpatient Visit on 07/19/2018   Component Date Value Ref Range Status   ??? PMHR 07/19/2018 159  bpm Final   Appointment on 07/19/2018   Component Date Value Ref Range Status   ??? EKG Ventricular Rate 07/19/2018 93  BPM Final   ??? EKG Atrial Rate 07/19/2018 93  BPM Final   ??? EKG P-R Interval 07/19/2018 140  ms Final   ??? EKG QRS Duration 07/19/2018 80  ms Final   ??? EKG Q-T Interval  07/19/2018 354  ms Final   ??? EKG QTC Calculation 07/19/2018 440  ms Final   ??? EKG Calculated P Axis 07/19/2018 39  degrees Final   ??? EKG Calculated R Axis 07/19/2018 22  degrees Final   ??? EKG Calculated T Axis 07/19/2018 53  degrees Final   ??? QTC Fredericia 07/19/2018 410  ms Final         Immunosuppression:   Tac: 1 mg/0.5 mg and MMF: 500 mg BID   Tac: 5-8    Changes: None, no additional changes, other than those made during visit  Next Labs: 3 Months  RTC: 01/2019

## 2018-08-02 NOTE — Unmapped (Signed)
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     Completed refill call assessment today to schedule patient's medication shipment from the [facility] [phone]).       Specialty medication(s) and dose(s) confirmed: Regimen is correct and unchanged.   Changes to medications: Andrew Dickerson reports no changes reported at this time.  Changes to insurance: No  Questions for the pharmacist: No    The patient will receive a drug information handout for each medication shipped and additional FDA Medication Guides as required.      DISEASE/MEDICATION-SPECIFIC INFORMATION        N/A    ADHERENCE     Medication Adherence    Patient reported X missed doses in the last month:  0                          MEDICARE PART B DOCUMENTATION     Mycophenolate mofetil 500mg : Patient has 7 days worth of tablets on hand.  Prograf 0.5mg : Patient has 7 days worth of capsules on hand.    SHIPPING     Shipping address confirmed in Epic.     Delivery Scheduled: Yes, Expected medication delivery date: 08/08/18 via UPS or courier.     Medication will be delivered via UPS to the home address in [place].    Andrew Dickerson   [facility] Shared [facility] Specialty Technician

## 2018-08-07 MED FILL — LISINOPRIL 40 MG TABLET: 30 days supply | Qty: 30 | Fill #3

## 2018-08-07 MED FILL — PROGRAF 0.5 MG CAPSULE: 30 days supply | Qty: 90 | Fill #3 | Status: AC

## 2018-08-07 MED FILL — LISINOPRIL 40 MG TABLET: 30 days supply | Qty: 30 | Fill #3 | Status: AC

## 2018-08-07 MED FILL — LEVOTHYROXINE 75 MCG TABLET: 30 days supply | Qty: 30 | Fill #1 | Status: AC

## 2018-08-07 MED FILL — ATORVASTATIN 80 MG TABLET: 30 days supply | Qty: 30 | Fill #3

## 2018-08-07 MED FILL — PROGRAF 0.5 MG CAPSULE: 30 days supply | Qty: 90 | Fill #3

## 2018-08-07 MED FILL — AMLODIPINE 10 MG TABLET: 90 days supply | Qty: 90 | Fill #0 | Status: AC

## 2018-08-07 MED FILL — CHLORTHALIDONE 25 MG TABLET: 30 days supply | Qty: 30 | Fill #1 | Status: AC

## 2018-08-07 MED FILL — DILTIAZEM CD 180 MG CAPSULE,EXTENDED RELEASE 24 HR: 30 days supply | Qty: 60 | Fill #0 | Status: AC

## 2018-08-07 MED FILL — LEVOTHYROXINE 75 MCG TABLET: ORAL | 30 days supply | Qty: 30 | Fill #1

## 2018-08-07 MED FILL — MYCOPHENOLATE MOFETIL 500 MG TABLET: 90 days supply | Qty: 180 | Fill #0 | Status: AC

## 2018-08-07 MED FILL — CHLORTHALIDONE 25 MG TABLET: ORAL | 30 days supply | Qty: 30 | Fill #1

## 2018-08-07 MED FILL — ATORVASTATIN 80 MG TABLET: 30 days supply | Qty: 30 | Fill #3 | Status: AC

## 2018-09-12 NOTE — Unmapped (Signed)
[facility] Specialty Pharmacy Refill Coordination Note  Medication: mycophenolate, prograf    Unable to reach patient to schedule shipment for medication being filled at [facility]. lvm, pt said he would call back but did not.  As this is the 3rd unsuccessful attempt to reach the patient, no additional phone call attempts will be made at this time.      Phone numbers attempted: [phone]  Last scheduled delivery: 08/07/2018    Please call the [facility] at [phone] (option 4) should you have any further questions.      Thanks,  [facility] Shared [facility] Specialty Team

## 2018-09-20 NOTE — Unmapped (Signed)
Patient called and spoke with technician DD. He stated that he had enough meds for now, and just wanted [place] to call back next week.  Zeroing out call attempts and adjusting call date.

## 2018-09-28 MED FILL — CHLORTHALIDONE 25 MG TABLET: 30 days supply | Qty: 30 | Fill #2 | Status: AC

## 2018-09-28 MED FILL — LEVOTHYROXINE 75 MCG TABLET: ORAL | 30 days supply | Qty: 30 | Fill #2

## 2018-09-28 MED FILL — LEVOTHYROXINE 75 MCG TABLET: 30 days supply | Qty: 30 | Fill #2 | Status: AC

## 2018-09-28 MED FILL — CHLORTHALIDONE 25 MG TABLET: ORAL | 30 days supply | Qty: 30 | Fill #2

## 2018-09-28 NOTE — Unmapped (Signed)
Patient called back today stating he only needed levothyroxine and chlorthalidone and did not need any other medications at this time.

## 2018-10-02 NOTE — Unmapped (Signed)
[facility] Special[facility] Pharmacy Refill and Clinical Coordination Note  Medication(s): mycophenolate, prograf - not filling today  Went over all meds with pt on call today. Besides meds listed in this note, pt denies needing ANYTHING else filled at this time. Pt aware to call us with any needs/concerns/refills if need arises prior to our next scheduled refill call.      Andrew Dickerson, DOB: 1955-11-13  Phone: [phone] (home) , Alternate phone contact: N/A  Shipping address: [address]  [place] [address]  Phone or address changes today?: No  All above HIPAA information verified.  Insurance changes? No    Completed refill and clinical call assessment today to schedule patient's medication shipment from the [facility] [phone]).      MEDICATION RECONCILIATION    Confirmed the medication and dosage are correct and have not changed: Yes, regimen is correct and unchanged.    Were there any changes to your medication(s) in the past month:  No, there are no changes reported at this time.    ADHERENCE    Is this medicine transplant or covered by Medicare Part B? Yes.    Mycophenolate mofetil 500mg : Patient has 2 months worth of tablets on hand.  Prograf 0.5mg : Patient has 2 months worth of capsules on hand.    Did you miss any doses in the past 4 weeks? No missed doses reported.  Adherence counseling provided? Not needed     SIDE EFFECT MANAGEMENT    Are you tolerating your medication?:  Andrew Dickerson reports tolerating the medication.  Side effect management discussed: None      Therapy is appropriate and should be continued.    Evidence of clinical benefit: Do you feel that that the medication is helping? Yes      FINANCIAL/SHIPPING    Delivery Scheduled: Patient declined refill at this time due to patient has over 2 months of both his txp meds on hand. requests call back in 6 weeks. He states he will call in refills for his maintenance meds if he needs them sooner than our next scheduled call out in 6 weeks..     Medication will be delivered via n/a to the n/a address in [facility].    Additional medications refilled: No additional medications/refills needed at this time.    The patient will receive a drug information handout for each medication shipped and additional FDA Medication Guides as required.      Andrew Dickerson did not have any additional questions at this time.    Delivery address confirmed in Epic.     We will follow up with patient monthly for standard refill processing and delivery.      Thank you,  Thad Ranger   [facility] Shared [facility] Specialty Pharmacist

## 2018-11-06 NOTE — Unmapped (Signed)
[facility] Special[facility] Pharmacy Refill Coordination Note    Specialty Medication(s) to be Shipped:   Transplant: Cellcept 500mg  and Prograf 0.5mg     Other medication(s) to be shipped: norvasc 10mg , apririn 81mg , lipitor 80mg , hygroton 25mg , diltiazem 180mg , pepcid 20mg , synthroid 75mg , lisinopril 40mg ,     [name], DOB: 1956-05-18  Phone: [phone] (home)       All above HIPAA information was verified with patient.     Completed refill call assessment today to schedule patient's medication shipment from the [facility] [phone]).       Specialty medication(s) and dose(s) confirmed: Regimen is correct and unchanged.   Changes to medications: Jerrald reports no changes reported at this time.  Changes to insurance: No  Questions for the pharmacist: No    The patient will receive a drug information handout for each medication shipped and additional FDA Medication Guides as required.      DISEASE/MEDICATION-SPECIFIC INFORMATION        N/A    ADHERENCE     Medication Adherence    Patient reported X missed doses in the last month:  0  Specialty Medication:  tacrolimus  Patient is on additional specialty medications:  Yes  Additional Specialty Medications:  cellcept  Patient Reported Additional Medication X Missed Doses in the Last Month:  0  Patient is on more than two specialty medications:  No  Any gaps in refill history greater than 2 weeks in the last 3 months:  no              MEDICARE PART B DOCUMENTATION     Mycophenolate mofetil 500mg : Patient has 1 week tablets on hand.  Prograf 0.5mg : Patient has 1 week capsules on hand.    SHIPPING     Shipping address confirmed in Epic.     Delivery Scheduled: Yes, Expected medication delivery date: 11/08/2018 via UPS or courier.     Medication will be delivered via UPS to the home address in [facility] [place].    Zareya Tuckett Perlie Gold   [facility] Specialty Technician

## 2018-11-07 MED FILL — AMLODIPINE 10 MG TABLET: ORAL | 90 days supply | Qty: 90 | Fill #1

## 2018-11-07 MED FILL — CHLORTHALIDONE 25 MG TABLET: ORAL | 30 days supply | Qty: 30 | Fill #3

## 2018-11-07 MED FILL — PROGRAF 0.5 MG CAPSULE: 30 days supply | Qty: 90 | Fill #4

## 2018-11-07 MED FILL — ASPIRIN 81 MG TABLET,DELAYED RELEASE: 90 days supply | Qty: 90 | Fill #1 | Status: AC

## 2018-11-07 MED FILL — AMLODIPINE 10 MG TABLET: 90 days supply | Qty: 90 | Fill #1 | Status: AC

## 2018-11-07 MED FILL — DILTIAZEM CD 180 MG CAPSULE,EXTENDED RELEASE 24 HR: 30 days supply | Qty: 60 | Fill #1

## 2018-11-07 MED FILL — MYCOPHENOLATE MOFETIL 500 MG TABLET: ORAL | 90 days supply | Qty: 180 | Fill #1

## 2018-11-07 MED FILL — LEVOTHYROXINE 75 MCG TABLET: ORAL | 30 days supply | Qty: 30 | Fill #3

## 2018-11-07 MED FILL — DILTIAZEM CD 180 MG CAPSULE,EXTENDED RELEASE 24 HR: 30 days supply | Qty: 60 | Fill #1 | Status: AC

## 2018-11-07 MED FILL — ATORVASTATIN 80 MG TABLET: 30 days supply | Qty: 30 | Fill #4

## 2018-11-07 MED FILL — PROGRAF 0.5 MG CAPSULE: 30 days supply | Qty: 90 | Fill #4 | Status: AC

## 2018-11-07 MED FILL — ACID REDUCER (FAMOTIDINE) 20 MG TABLET: 88 days supply | Qty: 175 | Fill #1 | Status: AC

## 2018-11-07 MED FILL — ASPIRIN 81 MG TABLET,DELAYED RELEASE: ORAL | 90 days supply | Qty: 90 | Fill #1

## 2018-11-07 MED FILL — ACID REDUCER (FAMOTIDINE) 20 MG TABLET: ORAL | 88 days supply | Qty: 175 | Fill #1

## 2018-11-07 MED FILL — CHLORTHALIDONE 25 MG TABLET: 30 days supply | Qty: 30 | Fill #3 | Status: AC

## 2018-11-07 MED FILL — LEVOTHYROXINE 75 MCG TABLET: 30 days supply | Qty: 30 | Fill #3 | Status: AC

## 2018-11-07 MED FILL — MYCOPHENOLATE MOFETIL 500 MG TABLET: 90 days supply | Qty: 180 | Fill #1 | Status: AC

## 2018-11-07 MED FILL — LISINOPRIL 40 MG TABLET: 30 days supply | Qty: 30 | Fill #4

## 2018-11-07 MED FILL — ATORVASTATIN 80 MG TABLET: 30 days supply | Qty: 30 | Fill #4 | Status: AC

## 2018-11-07 MED FILL — LISINOPRIL 40 MG TABLET: 30 days supply | Qty: 30 | Fill #4 | Status: AC

## 2018-11-14 DIAGNOSIS — Z79899 Other long term (current) drug therapy: Secondary | ICD-10-CM | POA: Diagnosis not present

## 2018-11-14 DIAGNOSIS — Z941 Heart transplant status: Secondary | ICD-10-CM | POA: Diagnosis not present

## 2018-11-14 DIAGNOSIS — Z48298 Encounter for aftercare following other organ transplant: Secondary | ICD-10-CM | POA: Diagnosis not present

## 2018-11-14 DIAGNOSIS — R7989 Other specified abnormal findings of blood chemistry: Secondary | ICD-10-CM | POA: Diagnosis not present

## 2018-11-17 LAB — CBC W/ DIFFERENTIAL
BASOPHILS ABSOLUTE COUNT: 0.1 10*9/L
BASOPHILS RELATIVE PERCENT: 0.9 %
EOSINOPHILS RELATIVE PERCENT: 2.1 %
HEMATOCRIT: 40 %
HEMOGLOBIN: 13.7 g/dL
HYPERCHROMASIA: 30.2 pg
LYMPHOCYTES ABSOLUTE COUNT: 1.2 10*9/L
LYMPHOCYTES RELATIVE PERCENT: 15.4 %
MEAN CORPUSCULAR HEMOGLOBIN CONC: 34.3 g/dL
MEAN CORPUSCULAR HEMOGLOBIN: 30.2 pg
MEAN CORPUSCULAR VOLUME: 88.1 fL
MEAN PLATELET VOLUME: 10.4 fL
MONOCYTES ABSOLUTE COUNT: 0.4 10*9/L
NEUTROPHILS RELATIVE PERCENT: 75.8 %
PLATELET COUNT: 274 10*9/L
RED BLOOD CELL COUNT: 4.54 10*12/L
RED CELL DISTRIBUTION WIDTH: 13.1 %
WHITE BLOOD CELL COUNT: 7.6 10*9/L

## 2018-11-17 LAB — BASIC METABOLIC PANEL
CALCIUM: 9.8 mg/dL
CO2: 20 mmol/L
CREATININE: 1.19 mg/dL
EGFR MDRD AF AMER: 76 mL/min/{1.73_m2}
EGFR MDRD NON AF AMER: 66 mL/min/{1.73_m2}
GLUCOSE RANDOM: 118 mg/dL — ABNORMAL HIGH
POTASSIUM: 5 mmol/L
POTASSIUM: 5 mmol/L — ABNORMAL HIGH

## 2018-11-20 NOTE — Unmapped (Signed)
Discussed recent labs with Edgar Frisk, PharmD.  Plan is to Make No Changes  with repeat labs in 3 Months.    Mr. Lamb sent patient My Chart message for review.       Lab Results   Component Value Date    TACROLIMUS 6.4 11/14/2018     Goal: Tac 5-8  Current Dose: 1 mg in the am and 0.5 mg in the pm.     Lab Results   Component Value Date    BUN 23 11/14/2018    CREATININE 1.19 11/14/2018    K 5.0 11/14/2018    GLU 118 (H) 11/14/2018    MG 1.3 (L) 11/14/2018     Lab Results   Component Value Date    WBC 7.6 11/14/2018    HGB 13.7 11/14/2018    HCT 40.0 11/14/2018    PLT 274 11/14/2018    NEUTROABS 5.8 11/14/2018    EOSABS 0.2 11/14/2018

## 2018-12-01 NOTE — Unmapped (Signed)
[facility] Specialty Pharmacy Refill Coordination Note    Specialty Medication(s) to be Shipped:   Transplant: Prograf 0.5mg     Other medication(s) to be shipped: levothyroxine,atorvastatin,chlorthalidone,lisinopril and diltiazem     Andrew Dickerson, DOB: July 24, 1956  Phone: [phone] (home)       All above HIPAA information was verified with patient.     Completed refill call assessment today to schedule patient's medication shipment from the [facility] [phone]).       Specialty medication(s) and dose(s) confirmed: Regimen is correct and unchanged.   Changes to medications: Andrew Dickerson reports no changes reported at this time.  Changes to insurance: No  Questions for the pharmacist: No    Confirmed patient received Welcome Packet with first shipment. The patient will receive a drug information handout for each medication shipped and additional FDA Medication Guides as required.       DISEASE/MEDICATION-SPECIFIC INFORMATION        N/A    SPECIALTY MEDICATION ADHERENCE     Medication Adherence    Patient reported X missed doses in the last month:  0  Specialty Medication:  prograf  Patient is on additional specialty medications:  Yes  Additional Specialty Medications:  mycophenolate  Patient Reported Additional Medication X Missed Doses in the Last Month:  0  Patient is on more than two specialty medications:  No  Any gaps in refill history greater than 2 weeks in the last 3 months:  no  Demonstrates understanding of importance of adherence:  yes  Informant:  patient  Reliability of informant:  reliable  Confirmed plan for next specialty medication refill:  delivery by pharmacy  Refills needed for supportive medications:  not needed          Refill Coordination    Has the Patients' Contact Information Changed:  No  Is the Shipping Address Different:  No           Prograf 05 mg caps. Patient has 10 days supply remaining      SHIPPING     Shipping address confirmed in Epic.     Delivery Scheduled: Yes, Expected medication delivery date: 040220.     Medication will be delivered via UPS to the home address in [place].    Andrew Dickerson D Maegan Buller   [facility] Shared [facility] Specialty Technician

## 2018-12-06 MED FILL — DILTIAZEM CD 180 MG CAPSULE,EXTENDED RELEASE 24 HR: 30 days supply | Qty: 60 | Fill #2

## 2018-12-06 MED FILL — LISINOPRIL 40 MG TABLET: 30 days supply | Qty: 30 | Fill #5

## 2018-12-06 MED FILL — LEVOTHYROXINE 75 MCG TABLET: 30 days supply | Qty: 30 | Fill #4 | Status: AC

## 2018-12-06 MED FILL — LEVOTHYROXINE 75 MCG TABLET: ORAL | 30 days supply | Qty: 30 | Fill #4

## 2018-12-06 MED FILL — CHLORTHALIDONE 25 MG TABLET: 30 days supply | Qty: 30 | Fill #4 | Status: AC

## 2018-12-06 MED FILL — PROGRAF 0.5 MG CAPSULE: 30 days supply | Qty: 90 | Fill #5 | Status: AC

## 2018-12-06 MED FILL — ATORVASTATIN 80 MG TABLET: 30 days supply | Qty: 30 | Fill #5 | Status: AC

## 2018-12-06 MED FILL — DILTIAZEM CD 180 MG CAPSULE,EXTENDED RELEASE 24 HR: 30 days supply | Qty: 60 | Fill #2 | Status: AC

## 2018-12-06 MED FILL — LISINOPRIL 40 MG TABLET: 30 days supply | Qty: 30 | Fill #5 | Status: AC

## 2018-12-06 MED FILL — ATORVASTATIN 80 MG TABLET: 30 days supply | Qty: 30 | Fill #5

## 2018-12-06 MED FILL — PROGRAF 0.5 MG CAPSULE: 30 days supply | Qty: 90 | Fill #5

## 2018-12-06 MED FILL — CHLORTHALIDONE 25 MG TABLET: ORAL | 30 days supply | Qty: 30 | Fill #4

## 2018-12-26 ENCOUNTER — Ambulatory Visit: Payer: Medicare HMO

## 2019-01-02 NOTE — Unmapped (Signed)
[facility] Specialty Pharmacy Refill Coordination Note  Medication: Mycophenolate 500mg  & Prograf 0.5mg     Unable to reach patient to schedule shipment for medication being filled at [facility]. Left voicemail on phone.  As this is the 3rd unsuccessful attempt to reach the patient, no additional phone call attempts will be made at this time.      Phone numbers attempted: [phone] (H)    Last scheduled delivery: 12/06/2018    Please call the [facility] at [phone] (option 4) should you have any further questions.      Thanks,  [name] Shared [facility] Specialty Team

## 2019-01-02 NOTE — Unmapped (Signed)
[facility] Special[facility] Pharmacy Refill Coordination Note    Specialty Medication(s) to be Shipped:   Transplant: Prograf 0.5mg     Other medication(s) to be shipped: lisinopril, diltiazem, chlorthalidone, atorvastatin, levothyroxine and Accu-Chek Test strips     Andrew Dickerson, DOB: 1956/01/27  Phone: [phone] (home)       All above HIPAA information was verified with patient.     Completed refill call assessment today to schedule patient's medication shipment from the [facility] [phone]).       Specialty medication(s) and dose(s) confirmed: Regimen is correct and unchanged.   Changes to medications: Andrew Dickerson reports no changes at this time.  Changes to insurance: No  Questions for the pharmacist: No    Confirmed patient received Welcome Packet with first shipment. The patient will receive a drug information handout for each medication shipped and additional FDA Medication Guides as required.       DISEASE/MEDICATION-SPECIFIC INFORMATION        N/A    SPECIALTY MEDICATION ADHERENCE     Medication Adherence    Patient reported X missed doses in the last month:  1  Specialty Medication:  Prograf 0.5mg   Informant:  patient            Prograf 0.5 mg: 14 days of medicine on hand         SHIPPING     Shipping address confirmed in Epic.     Delivery Scheduled: Yes, Expected medication delivery date: 01/10/2019.     Medication will be delivered via UPS to the home address in [facility] [place].    [name]   [facility] Shared [facility] Specialty Pharmacist

## 2019-01-03 MED ORDER — ATORVASTATIN 80 MG TABLET
ORAL_TABLET | 11 refills | 0 days | Status: CP
Start: 2019-01-03 — End: 2020-01-03
  Filled 2019-01-09: qty 30, 30d supply, fill #0

## 2019-01-03 MED ORDER — BLOOD SUGAR DIAGNOSTIC STRIPS
ORAL_STRIP | 11 refills | 0 days | Status: CP
Start: 2019-01-03 — End: 2020-01-03

## 2019-01-09 DIAGNOSIS — Z20828 Contact with and (suspected) exposure to other viral communicable diseases: Secondary | ICD-10-CM | POA: Diagnosis not present

## 2019-01-09 MED FILL — LEVOTHYROXINE 75 MCG TABLET: ORAL | 30 days supply | Qty: 30 | Fill #5

## 2019-01-09 MED FILL — CHLORTHALIDONE 25 MG TABLET: ORAL | 30 days supply | Qty: 30 | Fill #5

## 2019-01-09 MED FILL — PROGRAF 0.5 MG CAPSULE: 30 days supply | Qty: 90 | Fill #6 | Status: AC

## 2019-01-09 MED FILL — LISINOPRIL 40 MG TABLET: 30 days supply | Qty: 30 | Fill #6

## 2019-01-09 MED FILL — PROGRAF 0.5 MG CAPSULE: 30 days supply | Qty: 90 | Fill #6

## 2019-01-09 MED FILL — LISINOPRIL 40 MG TABLET: 30 days supply | Qty: 30 | Fill #6 | Status: AC

## 2019-01-09 MED FILL — DILTIAZEM CD 180 MG CAPSULE,EXTENDED RELEASE 24 HR: 30 days supply | Qty: 60 | Fill #3

## 2019-01-09 MED FILL — LEVOTHYROXINE 75 MCG TABLET: 30 days supply | Qty: 30 | Fill #5 | Status: AC

## 2019-01-09 MED FILL — ACCU-CHEK AVIVA PLUS TEST STRIPS: 33 days supply | Qty: 200 | Fill #0

## 2019-01-09 MED FILL — ATORVASTATIN 80 MG TABLET: 30 days supply | Qty: 30 | Fill #0 | Status: AC

## 2019-01-09 MED FILL — DILTIAZEM CD 180 MG CAPSULE,EXTENDED RELEASE 24 HR: 30 days supply | Qty: 60 | Fill #3 | Status: AC

## 2019-01-09 MED FILL — CHLORTHALIDONE 25 MG TABLET: 30 days supply | Qty: 30 | Fill #5 | Status: AC

## 2019-01-09 MED FILL — ACCU-CHEK AVIVA PLUS TEST STRIPS: 33 days supply | Qty: 200 | Fill #0 | Status: AC

## 2019-02-02 NOTE — Unmapped (Signed)
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     Completed refill call assessment today to schedule patient's medication shipment from the [facility] [phone]).       Specialty medication(s) and dose(s) confirmed: Regimen is correct and unchanged.   Changes to medications: Andrew Dickerson reports no changes reported at this time.  Changes to insurance: No  Questions for the pharmacist: No    Confirmed patient received Welcome Packet with first shipment. The patient will receive a drug information handout for each medication shipped and additional FDA Medication Guides as required.       DISEASE/MEDICATION-SPECIFIC INFORMATION        N/A    SPECIALTY MEDICATION ADHERENCE     Medication Adherence    Patient reported X missed doses in the last month:  0  Specialty Medication:  Prograf 0.5mg   Patient is on additional specialty medications:  Yes  Additional Specialty Medications:  Mycophenoalte 500mg   Patient Reported Additional Medication X Missed Doses in the Last Month:  0  Patient is on more than two specialty medications:  No        Mycophenolate 500 mg: 9 days of medicine on hand   Prograf 0.5 mg: 9 days of medicine on hand     SHIPPING     Shipping address confirmed in Epic.     Delivery Scheduled: Yes, Expected medication delivery date: 02/08/2019.     Medication will be delivered via UPS to the home address in [facility] [place].    Andrew Dickerson [name]   [facility] Shared [facility] Specialty Technician

## 2019-02-07 MED FILL — DILTIAZEM CD 180 MG CAPSULE,EXTENDED RELEASE 24 HR: 30 days supply | Qty: 60 | Fill #4

## 2019-02-07 MED FILL — LISINOPRIL 40 MG TABLET: 30 days supply | Qty: 30 | Fill #7 | Status: AC

## 2019-02-07 MED FILL — DILTIAZEM CD 180 MG CAPSULE,EXTENDED RELEASE 24 HR: 30 days supply | Qty: 60 | Fill #4 | Status: AC

## 2019-02-07 MED FILL — LEVOTHYROXINE 75 MCG TABLET: ORAL | 30 days supply | Qty: 30 | Fill #6

## 2019-02-07 MED FILL — PROGRAF 0.5 MG CAPSULE: 30 days supply | Qty: 90 | Fill #7

## 2019-02-07 MED FILL — ATORVASTATIN 80 MG TABLET: 30 days supply | Qty: 30 | Fill #1

## 2019-02-07 MED FILL — ASPIRIN 81 MG TABLET,DELAYED RELEASE: 90 days supply | Qty: 90 | Fill #2 | Status: AC

## 2019-02-07 MED FILL — PROGRAF 0.5 MG CAPSULE: 30 days supply | Qty: 90 | Fill #7 | Status: AC

## 2019-02-07 MED FILL — AMLODIPINE 10 MG TABLET: ORAL | 90 days supply | Qty: 90 | Fill #2

## 2019-02-07 MED FILL — MYCOPHENOLATE MOFETIL 500 MG TABLET: 90 days supply | Qty: 180 | Fill #2 | Status: AC

## 2019-02-07 MED FILL — ATORVASTATIN 80 MG TABLET: 30 days supply | Qty: 30 | Fill #1 | Status: AC

## 2019-02-07 MED FILL — MYCOPHENOLATE MOFETIL 500 MG TABLET: ORAL | 90 days supply | Qty: 180 | Fill #2

## 2019-02-07 MED FILL — LISINOPRIL 40 MG TABLET: 30 days supply | Qty: 30 | Fill #7

## 2019-02-07 MED FILL — LEVOTHYROXINE 75 MCG TABLET: 30 days supply | Qty: 30 | Fill #6 | Status: AC

## 2019-02-07 MED FILL — CHLORTHALIDONE 25 MG TABLET: ORAL | 30 days supply | Qty: 30 | Fill #6

## 2019-02-07 MED FILL — ASPIRIN 81 MG TABLET,DELAYED RELEASE: ORAL | 90 days supply | Qty: 90 | Fill #2

## 2019-02-07 MED FILL — AMLODIPINE 10 MG TABLET: 90 days supply | Qty: 90 | Fill #2 | Status: AC

## 2019-02-07 MED FILL — CHLORTHALIDONE 25 MG TABLET: 30 days supply | Qty: 30 | Fill #6 | Status: AC

## 2019-02-21 ENCOUNTER — Telehealth
Admit: 2019-02-21 | Discharge: 2019-02-22 | Payer: MEDICARE | Attending: Cardiovascular Disease | Primary: Cardiovascular Disease

## 2019-02-21 DIAGNOSIS — I10 Essential (primary) hypertension: Secondary | ICD-10-CM

## 2019-02-21 DIAGNOSIS — E782 Mixed hyperlipidemia: Secondary | ICD-10-CM

## 2019-02-21 DIAGNOSIS — E669 Obesity, unspecified: Secondary | ICD-10-CM

## 2019-02-21 DIAGNOSIS — E0800 Diabetes mellitus due to underlying condition with hyperosmolarity without nonketotic hyperglycemic-hyperosmolar coma (NKHHC): Secondary | ICD-10-CM

## 2019-02-21 DIAGNOSIS — E038 Other specified hypothyroidism: Secondary | ICD-10-CM

## 2019-02-21 DIAGNOSIS — Z941 Heart transplant status: Principal | ICD-10-CM

## 2019-02-21 NOTE — Unmapped (Signed)
Medicaiton reconciliation for annual transplant video visit completed.

## 2019-02-21 NOTE — Unmapped (Signed)
Pre-Clinic Visit Screening  02/21/2019    Andrew Dickerson is confirmed to attend the upcoming Virtual Clinic visit with Dr. Cherly Hensen on 02/21/2019 at 1:45 PM via Video, via phone #[phone] (patient is physically located in [place]).    I have confirmed with patient:   Current Outpatient Medications on File Prior to Visit   Medication Sig   ??? amLODIPine (NORVASC) 10 MG tablet Take 1 tablet (10 mg total) by mouth daily.   ??? aspirin (ADULT LOW DOSE ASPIRIN) 81 MG tablet Take 1 tablet (81 mg total) by mouth daily.   ??? atorvastatin (LIPITOR) 80 MG tablet TAKE 1 TABLET (80 MG TOTAL) BY MOUTH DAILY.   ??? blood sugar diagnostic Strp CHECK BLOOD SUGAR BEFORE MEALS, AT BEDTIME, & AS NEEDED FOR HYPERGLYCEMIA UP TO 6 TIMES A DAY   ??? chlorthalidone (HYGROTON) 25 MG tablet TAKE 1 TABLET BY MOUTH ONCE DAILY   ??? cholecalciferol, vitamin D3, 1,000 unit capsule Take 3,000 Units by mouth daily.   ??? diltiazem (CARDIZEM CD) 180 MG 24 hr capsule Take 2 capsule (360 mg) by mouth daily   ??? famotidine (PEPCID) 20 MG tablet Take 1 tablet (20 mg total) by mouth Two (2) times a day.   ??? levothyroxine (SYNTHROID) 75 MCG tablet TAKE 1 TABLET BY MOUTH ONCE DAILY   ??? lisinopriL (PRINIVIL,ZESTRIL) 40 MG tablet TAKE 1 TABLET (40 MG) BY MOUTH ONCE DAILY   ??? magnesium oxide 500 mg Tab Take1 tablet by mouth daily.   ??? metFORMIN (GLUCOPHAGE) 1000 MG tablet Take 1,000 mg by mouth Two (2) times a day.   ??? mycophenolate (CELLCEPT) 500 mg tablet Take 1 tablet (500 mg total) by mouth Two (2) times a day.   ??? PROGRAF 0.5 mg capsule Take two capsules ( 1 mg ) in the morning and one capsule ( 0.5 mg ) in the evening   Patient is currently taking Omperazole 20 mg daily (he had in reserve at home) [facility] was out of pepcid       Refills needed:  Yes    Allergies reviewed :  Yes    Doximity test: Successful     The patient has been advised of the limitations and potential risks of this mode of treatment (including, but not limited to, the absence of in-person examination) and has agreed to be treated using telemedicine, understanding that he may incur co-pays and cost sharing.  He has also been advised to contact their provider???s office for worsening conditions, and seek emergency medical treatment and/or call 911 if the patient deems either necessary.  The patient's questions regarding telemedicine have been answered, and he agreed to the telemedicine visit.       Duaine Dredge, RN

## 2019-02-21 NOTE — Unmapped (Signed)
[facility] - Video Visit         Primary Provider: Tana Conch, MD   [facility]: Melvia Heaps, MD??   Dermatologist:     Cherlyn Hruska, MD     Reason for Visit:  Andrew Dickerson is a 63 y.o. male who has been contacted 02/21/2019 for a Video Visit, instead of an in-person visit, for continued post-transplant care as his annual heart transplant clinic follow up.     Assessment & Plan:  Overall doing well.    - Med changes: Double MgOxide 400 mg softgels to 800 mg/day.  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  On dual immunosuppression (mycophenolate 500 bid and tacrolimus 1/0.5 with goal 5-8) with normal cardiac function as again seen on today's echo.  Occasional single low level DSA (DR 16); no significant DSA's detected recently. No CAV.  - He used to be on Tac 1/0.5 until 02/2014, then 1 BID since then with most levels >7.  Lab Results   Component Value Date    Tacrolimus, Trough 6.4 11/14/2018    Tacrolimus, Trough 6.5 07/14/2018    Tacrolimus, Trough 7.6 11/29/2014    Tacrolimus, Trough 9.2 07/24/2014   - No changes (last tac dose adjustment 01/2018)   - His next diagnostic testing will be a Left heart catheterization in 07/2019.   - Return to [facility] clinic 01/2020, sooner PRN    # Hypertension.  Well controlled on his current 4 drug regimen (Amlodipine 10 qAM, Diltiazem 360 qPM, Lisinopril 40 qPM, Chlorthalidone 25 aAM). (Amlodipine increased from 5 to 10 in 07/2018.)     - No changes based on reported BPs; continue to monitor and further adjust PRN    # Hyperlipidemia. Tolerating statin therapy - Atorvastatin 80 mg.   Lab Results   Component Value Date    CHOL 141 07/14/2018    LDL 85 07/14/2018    HDL 32 (A) 07/14/2018    TRIG 142 07/14/2018    A1C 5.9 07/14/2018   - Since no CAV, favor more diet control efforts (as below) before starting Zetia.    # Diabetes, Obesity. On Metformin 1000 mg BID since at least 2017, managed by PCP.  His weight is up slightly, which he attributes to less exercise and staying at home during COVID-19, thus probably why blood sugars are higher if home monitor is accurate.  - Most recent HgbA1c 5.9 in 07/2018 (previously as high as 13.5% 12/2015; well-controlled since)  - Repeat HgbA1c this month given reported high blood glucose per monitor, and consider replacing monitor PRN.  - Encouraging ramping up exercise to help with DM and BP     # Hypothyroidism. TSH is 1.2 in 07/2018 on supplementation 75 mcg (TSH WNL since 2012).   - Continue to monitor periodically.    # Renal function, Electrolytes.   - His renal function has remained normal. Reassess as per routine, q 3 months.  - Mg levels have been relatively low (1.3 in 11/2018 and chronically <1.5 on Mg Oxide 400 QD OTC gel caps which he prefers)  - Double MgOxide 400 mg softgels to BID, and repeat labs (due this month)    # GERD. Previously on Omeprazole, on Famotidine since 07/2018. Only notes heartburn when  supine.  - He likes Famotidine, so desires no change in regimen.      # Health maintenance     Dental: Has dentures  Eyes: Last visit 01/2016. Recommend f/u, plans to go to [facility] in [place]  >Cancer Screening  CXR: Done 07/2018, normal imaging  PSA: 0.6 on 07/2018  Dermatology: Uncertain but believes he was screened in 2019 by a colleague of his previous dermatologist (Dr Terri Piedra); presumed benign; we will request results.   Colonoscopy: Last done on 04/2017, follow up due 2028 (Dr. Arlyce Dice, [facility])  >Endocrine  TSH ??? 1.2 07/2018 , on Synthroid (as above)  Vitamin D- 44 on 07/2018, on Vitamin D 3000 units daily.  AIC  - as above.  >ID/Vaccincations:   Flu Shot- Fall 2019, due this fall   Pneumovax - Prevnar13 07/18/12; PPSV23 06/09/2012 - due, ecommended updating at next PCP visit (should be readministered every 5 years)  Tetanus - last 05/02/2009 (at time of LVAD); due 2020, recommended updating at next PCP visit  Shingrix- Recommended he get with PCP if available    Next testing:  LHC in 07/2019  Labs: routinely every 3 months, sooner PRN  Clinic visit: Return to [facility] clinic in May 2021    - Joya Gaskins, MD      History of Present Illness:  Andrew Dickerson is a 63 y.o. 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 His post-transplant course has been notable for only for labile DM control (hospitalization locally for DKA 12/2015).  His cardiac status has been normal/stable, no CAV, occasional single DSA with MFI >1000, and he remains on chronic dual immunosuppression of  Tacrolimus and Mycophenolate.  His cardiac transplant-related diagnostic testing is detailed below.  His last endomyocardial biopsy was 08/24/16 (ISHLT grade 1R/1A).     Summary of recent visits/events:  - 02/01/2017 'annual' post-transplant [facility] clinic for his clinic visit.  Overall well, no medication changes, with now impressive control of his diabetes (HgA1c 5.4), well-controlled BP on 4 antihypertensives, but he still could lose weight (lowest post-transplant weight was 218 lbs last year 02/2016, when HgA1c was 7.9). Thus, we encouraged more exercise for intentional weight loss (goal <220 lbs), and to maintain current good health maintenance.    -01/17/2018 'annual' post-transplant [facility] clinic: doing overall well; medication changes that day: decrease Tacrolimus back to 1 mg qAM, 0.5 mg qPM.  In 11/2017, he URI, saw PCP for cough, fever, given doxycycline x 1 week and Tessalon perles with resolution of symptoms.  Well since, trying to lose weight, watching his diet (records all his food intake), goes to gym to walk 4-5 miles, 3x week MWF (goal of ~6000 steps/day on average), DM well controlled on metformin.    Interval History:  Since his last visit, no interim acute illnesses or hospital/ED visits.  At his 07/2018 DSE visit, Edgar Frisk, PharmD, CPP, increased Amlodipine  from 5 to 10 mg/d for better BP control (despite 4 meds), changed Omeprazole to Famotidine 20 mg BID, and discontinued tamsulosin.  He reports no issues with these med changes, except for transient worse GERD symptoms (as noted below).  Overall doing very well without any other active symptoms. He reports home BP 130-145/70s - better since 2019 (we increased Amlodipine last year b/c of even higher BP).  He thinks his blood sugar monitor is actiing up - with a steady increase over time  BS 123-150 at the same (different hands); e.g., this AM was  150 after eating chocolate, previous AM 135, 148 on 6/12 (previously 120s); he is checking once or twice a day, but not QAC; after dinner yesterday 139; metformin dose the same since 2017.  He reports his weight is 237 lb, stable for the past several months (higher than 02/2018 visit).  Probably because not really exercising, not walking as much since mostly at home with COVID quarantine, but when does he walk, he walks 3-4 miles at a time (previously 3x/week, including treadmill and around neighborhood).  He notes no dyspnea with exertion or at rest, orthopnea, PND, chest pain, palpitations, dizziness/lightheadnedness, presyncope, syncope. No nausea, vomiting, consipation.   His only symptom to review his intermittent heartburn; currently no heartburn except when supine.  He recalls lots of burps and heartburn pain at night after initial change from Omeprazole to Pepcid; but with Pepcid supply recently out, he is back on omeprazole (had a 90 day supply).  Typically he sees PCP Dr. Durene Cal every 6 months, but last saw ~1 year ago; last appt was cancelled b/c of Dr. Erasmo Leventhal new baby.  No urology issues to report; previous urologist Dr. Patsi Sears retired.     Pertinent Post-operative Testing/History:  Post-Operative course:  His post-operative course required continued antibiotic therapy for his pre-existing LVAD driveline infection. Wound vac was placed to his abdominal incision to promote healing. Post-operatively, antihypertensive medications were slowly introduced. He underwent 2 heart biopsies as an inpatient, with the 2nd on 08/02/2012 that showed mild acute cellular rejection, ISHLT 1R/2. His immunosupressive therapy was continued and Tacrolimus was titrated to goal of 10-12.     Cardiac Diagnostic Testing:   ?? 07/18/13: LHC - no cardiac allograft vasculopathy (CAV)  ?? 08/06/14: LHC - no CAV   ?? 09/04/15: LHC - no CAV   ?? 08/23/16: LHC - no CAV    ?? 07/26/17: LHC - no CAV   ?? 07/19/18: DSE (Dobutamine Stress Echo) - normal    Echo:  ?? 07/26/12: LVEF 60-65%  ?? 08/28/12: LVEF 60-65%  ?? 12/04/12: LVEF 60-65%  ?? 01/04/13: LVEF >55%  ?? 05/14/13: LVEF 60-65%  ?? 02/12/14: LVEF 60-65%  ?? 01/17/15: LVEF 65-70%  ?? 02/10/16: LVEF 60-65%  ?? 02/01/17: LVEF 65%  ?? 01/17/18: LVEF 60-65%  ?? 07/19/18: Normal LVEF on DSE    DSA:   ?? 08/01/12: No DSA (with MFI>1000)  ?? 08/18/12: No DSA  ?? 12/04/12: No DSA  ?? 04/09/13: No DSA  ?? 02/12/14: DR16, MFI 1499  ?? 01/07/15: DR16, MFI 1692  ?? 09/04/15: DR 16, MFI 3322  ?? 02/10/16: DR16, MFI 1644  ?? 02/01/17: DR16, MFI 1495  ?? 01/17/18: No DSA    Rejection History:   ?? 08/02/2012: Mild acute cellular rejection, ISHLT 1R/2. His immunosupressive therapy was continued and Tacrolimus has been titrated with daily levels for a current goal of 10-12.   ?? 09/11/12: showed mild rejection, ISHLT 1R/2 (One small foci of infiltrate, background is clean). His Prednisone was decreased to 12.5 mg daily. Subsequent biopsies have showed no rejection and so his immunosuppression was gradually weaned as per our practice protocol.  ?? 01/05/13: Baseline Allomap 34  ?? 02/03/13: Prednisone stopped  ?? 04/09/13: Allomap off Prednisone 33       Past Medical History:  Past Medical History:   Diagnosis Date   ??? Coronary atherosclerosis of native coronary artery 04/21/2009    12/92: CABG x 5 with LIMA to LAD and saphenous grafts to diagonal 1, obtuse marginal, distal circ and distal  PDA 8/00: Repeat CABG x 3 with redo free left radial to distal LAD, saphenous to RCA, obtuse margina 2005: multiple stents placed to vein grafts and native arteries    ??? DM (diabetes mellitus) (CMS-HCC) 12/27/2012   ??? Hypothyroidism 05/07/2011   ??? LVAD (left ventricular assist device) present (CMS-HCC) 12/27/2012    05/09/09 received heart mate II    ??? Mixed hyperlipidemia 05/07/2011   ??? Obesity 12/27/2012     Past Surgical History:   Procedure Laterality Date   ??? HEART TRANSPLANT  07/18/12    CMV D-/R-   ??? LEFT VENTRICULAR ASSIST DEVICE  05/09/09   ??? PR BIOPSY OF HEART LINING N/A 09/04/2015    Procedure: Left/Right Heart Catheterization W Biopsy;  Surgeon: Lesle Reek, MD;  Location: [facility] CATH;  Service: Cardiology   ??? PR CATH PLACE/CORON ANGIO, IMG SUPER/INTERP,R&L HRT CATH, L HRT VENTRIC N/A 08/24/2016    Procedure: Left/Right Heart Catheterization W Intervention;  Surgeon: Lesle Reek, MD;  Location: [facility] CATH;  Service: Cardiology   ??? PR CATH PLACE/CORON ANGIO, IMG SUPER/INTERP,W LEFT HEART VENTRICULOGRAPHY N/A 07/26/2017    Procedure: Left Heart Catheterization;  Surgeon: Rosana Hoes, MD;  Location: [facility] CATH;  Service: Cardiology       Allergies: Patient has no known allergies.    Medications:   Current Outpatient Medications on File Prior to Visit   Medication Sig   ??? amLODIPine (NORVASC) 10 MG tablet Take 1 tablet (10 mg total) by mouth daily.   ??? aspirin (ADULT LOW DOSE ASPIRIN) 81 MG tablet Take 1 tablet (81 mg total) by mouth daily.   ??? atorvastatin (LIPITOR) 80 MG tablet TAKE 1 TABLET (80 MG TOTAL) BY MOUTH DAILY.   ??? blood sugar diagnostic Strp CHECK BLOOD SUGAR BEFORE MEALS, AT BEDTIME, & AS NEEDED FOR HYPERGLYCEMIA UP TO 6 TIMES A DAY   ??? chlorthalidone (HYGROTON) 25 MG tablet TAKE 1 TABLET BY MOUTH ONCE DAILY   ??? cholecalciferol, vitamin D3, 1,000 unit capsule Take 3,000 Units by mouth daily.   ??? diltiazem (CARDIZEM CD) 180 MG 24 hr capsule Take 2 capsule (360 mg) by mouth daily   ??? famotidine (PEPCID) 20 MG tablet Take 1 tablet (20 mg total) by mouth Two (2) times a day.   ??? levothyroxine (SYNTHROID) 75 MCG tablet TAKE 1 TABLET BY MOUTH ONCE DAILY   ??? lisinopriL (PRINIVIL,ZESTRIL) 40 MG tablet TAKE 1 TABLET (40 MG) BY MOUTH ONCE DAILY   ??? magnesium oxide 500 mg Tab Take1 tablet by mouth daily.   ??? metFORMIN (GLUCOPHAGE) 1000 MG tablet Take 1,000 mg by mouth Two (2) times a day.   ??? mycophenolate (CELLCEPT) 500 mg tablet Take 1 tablet (500 mg total) by mouth Two (2) times a day.   ??? PROGRAF 0.5 mg capsule Take two capsules ( 1 mg ) in the morning and one capsule ( 0.5 mg ) in the evening     No current facility-administered medications on file prior to visit.    NOTES  - MgOxide is 400 (not 500) QD, and is OTC gel caps which he prefers to prescribed tablets     Family History: Grandfather with CAD, heart attack 'early in life', unsure of age, died from MI age 60s. He has one son, age 62, who is healthy. He has a Engineer, maintenance (IT), born 06/2014.    Social History:   He's still living at home with his mother who's 8 years old right now. Used to work at a  pizza place and sell office supplies and is interested in returning to work in the future. Rare social drinking for special holiday.  No tobacco, or illicit drug use.    Review of Systems:  Rest of the review of systems is negative or unremarkable except as stated above.    Physical Exam:    There were no vitals filed for this visit.    Wt Readings from Last 12 Encounters:   01/17/18 (!) 105.1 kg (231 lb 9.6 oz)   07/26/17 (!) 103.9 kg (229 lb)   02/01/17 (!) 105.3 kg (232 lb 3.2 oz)   08/24/16 (!) 101.6 kg (224 lb)   02/10/16 99.3 kg (218 lb 14.4 oz)   09/04/15 (!) 113.4 kg (250 lb)   01/07/15 (!) 115.7 kg (255 lb)   08/06/14 (!) 108.9 kg (240 lb)   02/12/14 (!) 109.6 kg (241 lb 9.6 oz)   07/27/13 100.7 kg (222 lb)   05/14/13 (!) 103.6 kg (228 lb 6.4 oz)   04/09/13 (!) 104.1 kg (229 lb 6.4 oz)    There is no height or weight on file to calculate BMI.  (From last visit 01/2018, for reference)  Constitutional: Good hygiene, well groomed, NAD.   HENT: Normocephalic and atraumatic.  Well hydrated moist mucous membranes of the oral cavity. God dentition.  Neck: Supple without enlargements, no thyromegaly, bruit or JVD. No cervical or supraclavicular lymphadenopathy.   Cardiovascular: Nondisplaced PMI, normal S1, S2, no MGR. Normal carotid pulses without bruits. Normal peripheral pulses.   Lungs: Chest rise symmetric.  Clear to auscultation bilaterally, without wheezes/crackles/rhonchi. Good air movement.   Skin: No rashes/breakdowns   GI: abdomen round and soft, non-tender, and non-distended. no Hepatomegaly or masses. Mid-umbilical hernia noted, soft and reducible. BS+  Extremities: No edema dependently bilaterally.   Musculo Skeletal: No joint tenderness, deformity, effusions.  Psychiatry: Pleasant, talkative.  Neurological:   Nonfocal    Labs & Imaging:  Reviewed in EPIC.   Reviewed labs from 01/27/17 from local facility  No visits with results within 1 Day(s) from this visit.   Latest known visit with results is:   Abstract on 11/17/2018   Component Date Value Ref Range Status   ??? Sodium 11/14/2018 138  mmol/L Final   ??? Potassium 11/14/2018 5.0  mmol/L Final   ??? Chloride 11/14/2018 106  mmol/L Final   ??? CO2 11/14/2018 20.0  mmol/L Final   ??? BUN 11/14/2018 23  mg/dL Final   ??? Creatinine 11/14/2018 1.19  mg/dL Final   ??? Glucose 09/60/4540 118* mg/dL Final   ??? Calcium 98/07/9146 9.8  mg/dL Final   ??? EGFR MDRD Non Af Amer 11/14/2018 66  mL/min/1.50m2 Final   ??? EGFR MDRD Af Amer 11/14/2018 76  mL/min/1.29m2 Final   ??? Magnesium 11/14/2018 1.3* mg/dL Final   ??? WBC 82/95/6213 7.6  10*9/L Final   ??? RBC 11/14/2018 4.54  10*12/L Final   ??? HGB 11/14/2018 13.7  g/dL Final   ??? HCT 08/65/7846 40.0  % Final   ??? MCV 11/14/2018 88.1  fL Final   ??? MCH 11/14/2018 30.2  pg Corrected   ??? MCHC 11/14/2018 34.3  g/dL Final   ??? RDW 96/29/5284 13.1  % Final   ??? MPV 11/14/2018 10.4  fL Final   ??? Platelet 11/14/2018 274  10*9/L Final   ??? Neutrophils % 11/14/2018 75.8  % Final   ??? Lymphocytes % 11/14/2018 15.4  % Final   ??? Monocytes % 11/14/2018 5.8  %  Final   ??? Eosinophils % 11/14/2018 2.1  % Final   ??? Basophils % 11/14/2018 0.9  % Final   ??? Absolute Neutrophils 11/14/2018 5.8  10*9/L Final   ??? Absolute Lymphocytes 11/14/2018 1.2  10*9/L Final   ??? Absolute Monocytes 11/14/2018 0.4  10*9/L Final   ??? Absolute Eosinophils 11/14/2018 0.2  10*9/L Final   ??? Absolute Basophils 11/14/2018 0.1  10*9/L Final   ??? Tacrolimus, Trough 11/14/2018 6.4  ng/mL Final     I spent 25 minutes on the audio/video with the patient. I spent an additional 20 minutes on pre- and post-visit activities.     The patient was physically located in [place] or a state in which I am permitted to provide care. The patient and/or parent/guardian understood that s/he may incur co-pays and cost sharing, and agreed to the telemedicine visit. The visit was reasonable and appropriate under the circumstances given the patient's presentation at the time.    The patient and/or parent/guardian has been advised of the potential risks and limitations of this mode of treatment (including, but not limited to, the absence of in-person examination) and has agreed to be treated using telemedicine. The patient's/patient's family's questions regarding telemedicine have been answered.     If the phone/video visit was completed in an ambulatory setting, the patient and/or parent/guardian has also been advised to contact their provider???s office for worsening conditions, and seek emergency medical treatment and/or call 911 if the patient deems either necessary.

## 2019-02-23 ENCOUNTER — Ambulatory Visit (INDEPENDENT_AMBULATORY_CARE_PROVIDER_SITE_OTHER): Payer: Medicare HMO | Admitting: Family Medicine

## 2019-02-23 ENCOUNTER — Encounter: Payer: Self-pay | Admitting: Family Medicine

## 2019-02-23 ENCOUNTER — Other Ambulatory Visit: Payer: Self-pay

## 2019-02-23 VITALS — BP 130/76 | HR 95 | Temp 98.1°F | Ht 68.0 in | Wt 241.6 lb

## 2019-02-23 DIAGNOSIS — I5022 Chronic systolic (congestive) heart failure: Secondary | ICD-10-CM

## 2019-02-23 DIAGNOSIS — E1169 Type 2 diabetes mellitus with other specified complication: Secondary | ICD-10-CM | POA: Diagnosis not present

## 2019-02-23 DIAGNOSIS — D899 Disorder involving the immune mechanism, unspecified: Secondary | ICD-10-CM

## 2019-02-23 DIAGNOSIS — I10 Essential (primary) hypertension: Secondary | ICD-10-CM

## 2019-02-23 DIAGNOSIS — E034 Atrophy of thyroid (acquired): Secondary | ICD-10-CM | POA: Diagnosis not present

## 2019-02-23 DIAGNOSIS — E1159 Type 2 diabetes mellitus with other circulatory complications: Secondary | ICD-10-CM | POA: Diagnosis not present

## 2019-02-23 DIAGNOSIS — Z941 Heart transplant status: Secondary | ICD-10-CM

## 2019-02-23 DIAGNOSIS — E785 Hyperlipidemia, unspecified: Secondary | ICD-10-CM

## 2019-02-23 DIAGNOSIS — E119 Type 2 diabetes mellitus without complications: Secondary | ICD-10-CM

## 2019-02-23 DIAGNOSIS — Z23 Encounter for immunization: Secondary | ICD-10-CM

## 2019-02-23 DIAGNOSIS — D849 Immunodeficiency, unspecified: Secondary | ICD-10-CM

## 2019-02-23 DIAGNOSIS — I152 Hypertension secondary to endocrine disorders: Secondary | ICD-10-CM

## 2019-02-23 NOTE — Patient Instructions (Addendum)
Health Maintenance Due  Topic Date Due  . OPHTHALMOLOGY EXAM - Dr. Truman Hayward at [facility]- Sign release of information at the check out desk for this before you leave 02/02/2017  . FOOT EXAM - today updated 02/21/2018  . HEMOGLOBIN A1C - with labs today 07/26/2018   Pneumovax 23 today as per [facility] request  IF you want to do shingrix- would be cheaper at a pharmacy- though do want you to know about 25% of folks will get fever/body aches/chills and I have been hesitant with covid 19 symptoms being so similar to recommend this right now.   Technically you are not due for Tetanus but if you want to get one per [facility] recommendations- can get this at your pharmacy as well (both shingrix and tetanus should be cheaper there)  Could try lamisil for toenail fungus on left foot- would want you to make sure ok with [facility] and we would need liver tests before and 6 weeks after starting it

## 2019-02-23 NOTE — Progress Notes (Signed)
Phone [phone]   Subjective:  [name] is a 63 y.o. year old very pleasant male patient who presents for/with See problem oriented charting Chief Complaint  Patient presents with  . Follow-up  . Diabetes  . Hypertension  . Hyperlipidemia  . Hypothyroidism  . Obesity   ROS- Denies visual changes, dizziness, CP, SOB.    Past Medical History-  Patient Active Problem List   Diagnosis Date Noted  . Immunosuppressed status ([place]) 02/23/2019    Priority: High  . Heart transplanted ([place]) 06/19/2014    Priority: High  . Chronic systolic heart failure ([place]) 03/02/2009    Priority: High  . 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    Priority: High  . 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 Diabetes mellitus type II, controlled ([place]) 12/13/2008    Priority: High  . Coronary atherosclerosis 04/25/2007    Priority: High  . Chronic abdominal pain 12/27/2015    Priority: Medium  . BPH (benign prostatic hypertrophy) 02/26/2014    Priority: Medium  . Hypothyroidism 05/14/2008    Priority: Medium  . Hypertension associated with diabetes ([place]) 05/14/2008    Priority: Medium  . ANEMIA, OTHER UNSPEC 04/03/2008    Priority: Medium  . Hyperlipidemia associated with type 2 diabetes mellitus ([place]) 04/25/2007    Priority: Medium  . GERD (gastroesophageal reflux disease) 06/19/2014    Priority: Low  . Osteoarthritis 06/19/2014    Priority: Low  . History of adenomatous polyp of colon 09/17/2013    Priority: Low  . Morbid obesity ([place]) 02/26/2009    Priority: Low    Medications- reviewed and updated Current Outpatient Medications  Medication Sig Dispense Refill  . amLODipine (NORVASC) 10 MG tablet Take 10 mg by mouth daily. Per [facility]    . aspirin EC 81 MG tablet Take 1 tablet (81 mg total) by mouth daily. 30 tablet 0  . atorvastatin (LIPITOR) 80 MG tablet Take 1 tablet (80 mg total) by mouth daily at 6 PM. 30 tablet 0  . chlorthalidone  (HYGROTON) 25 MG tablet Take 1 tablet (25 mg total) by mouth daily. 30 tablet 0  . cholecalciferol (VITAMIN D) 1000 units tablet Take 3 tablets (3,000 Units total) by mouth daily. 90 tablet 0  . diltiazem (TIAZAC) 180 MG 24 hr capsule Take 2 capsules (360 mg total) by mouth daily. 60 capsule 0  . levothyroxine (SYNTHROID, LEVOTHROID) 75 MCG tablet Take 1 tablet (75 mcg total) by mouth daily before breakfast. 30 tablet 0  . lisinopril (PRINIVIL,ZESTRIL) 40 MG tablet Take 1 tablet (40 mg total) by mouth daily. 30 tablet 0  . magnesium oxide (MAG-OX) 400 MG tablet Take 1.5 tablets (600 mg total) by mouth 2 (two) times daily. 45 tablet 0  . metFORMIN (GLUCOPHAGE) 1000 MG tablet TAKE 1 TABLET BY MOUTH TWICE DAILY WITH MEALS 180 tablet 3  . mycophenolate (CELLCEPT) 500 MG tablet Take 1 tablet (500 mg total) by mouth 2 (two) times daily. 60 tablet 0  . omeprazole (PRILOSEC) 20 MG capsule Take 1 capsule (20 mg total) by mouth daily. 30 capsule 0  . tacrolimus (PROGRAF) 0.5 MG capsule Take 2 capsules (1 mg total) by mouth 2 (two) times daily. 120 capsule 0   No current facility-administered medications for this visit.      Objective:  BP 130/76 (BP Location: Left Arm, Patient Position: Sitting, Cuff Size: Normal)   Pulse 95   Temp 98.1 F (36.7 C) (Oral)   Ht 5'  8" (1.727 m)   Wt 241 lb 9.6 oz (109.6 kg)   SpO2 97%   BMI 36.74 kg/m  Gen: NAD, resting comfortably CV: RRR no murmurs rubs or gallops Lungs: CTAB no crackles, wheeze, rhonchi Abdomen: soft/nontender/nondistended/normal bowel sounds. No rebound or guarding.  Ext: trace edema Skin: warm, dry Neuro: grossly normal, moves all extremities    Assessment and Plan   # Heart transplant and immunosuppressed- followed closely by [facility] - stable, continue current meds - immunosuppressed as result - they advise shingrix- discussed would be covered better at pharmacy -they advise Tdap- he had in 2013 and I told him he does not have to get this  yet but he wants to do at phrmacy -they advised Pneumovax- discussed possibly waiting until 70 but he wants to proceed -Will get labs with [facility]- sometime this month- a1c, lipid, tsh needed on my end   # Diabetes/morbid obesity with BMI over 35 and diabetes and hyperlipidemia S:  controlled on Metformin 1000 mg BID. No side effects, no missed doses.  CBGs- Checking BG once daily. FBG 120-130. Denies hypoglycemic episodes.  Exercise and diet- Watching diet, eating more carbs than normal over the past 3 months with covid 19. Moderate sugar intake. No structured exercise program. Weight up 8 lbs in last year- his plan is to get back out and try to walk- he misses the gym Lab Results  Component Value Date   HGBA1C 5.7 01/23/2018   HGBA1C 5.6 07/21/2017   HGBA1C 5.5 01/27/2017   A/P: hopefully controlled- he will get a1c with [facility] and send [place] a copy.   #hypertension S: controlled on lisinopril 40mg , diltiazem 360mg  XR, chlorthalidone 25mg , amlodipine 10mg . Occasional missed dose, no side effects. Has not been checking BP at home, machine broke. -130s-140s/70s typically. Occasional HA. Adds salt to food occasionally.  BP Readings from Last 3 Encounters:  02/23/19 130/76  01/16/18 132/68  12/20/17 (!) 144/76  A/P: Stable. Continue current medications. Encouraged him to get new BP machine  #hyperlipidemia S: reasonably controlled on max dose statin atorvastatin 80 mg. Denies myalgia or other side effects. Rarely misses a dose.  Lab Results  Component Value Date   CHOL 134 01/27/2017   HDL 30 (A) 01/27/2017   LDLCALC 83 01/27/2017   LDLDIRECT 69 02/12/2014   TRIG 115 01/27/2017   CHOLHDL 5.2 CALC 03/05/2008   A/P: states LDL was 85 on last check- also has upcoming labs so this can be reassessed  #hypothyroidism S: On thyroid medication-Taking Levothyroxine 75 mcg Lab Results  Component Value Date   TSH 1.20 08/11/2016  A/P: has been stable- he will get this checked with [facility] within the  month  #GERD- has done ok with pepcid but has not been able to find any so using omeprazole shor tterm  Lab/Order associations:   ICD-10-CM   1. Controlled type 2 diabetes mellitus without complication, without long-term current use of insulin (HCC)  E11.9   2. Hypertension associated with diabetes ([place])  E11.59    I10   3. Hyperlipidemia associated with type 2 diabetes mellitus ([place])  E11.69    E78.5   4. Chronic systolic heart failure (HCC) Chronic I50.22   5. Morbid obesity ([place]) Chronic E66.01   6. Heart transplanted ([place])  Z94.1   7. Immunosuppressed status ([place])  D89.9   8. Hypothyroidism due to acquired atrophy of thyroid  E03.4   9. Need for pneumococcal vaccination  Z23 Pneumococcal polysaccharide vaccine 23-valent greater than or equal to  2yo subcutaneous/IM   Return precautions advised.  Garret Reddish, MD

## 2019-03-05 MED ORDER — PROGRAF 0.5 MG CAPSULE
ORAL_CAPSULE | 11 refills | 0 days | Status: CP
Start: 2019-03-05 — End: 2019-03-28
  Filled 2019-03-07: qty 90, 30d supply, fill #0

## 2019-03-05 NOTE — Unmapped (Signed)
[facility] Clinical Assessment & Refill Coordination Note    Andrew Dickerson, DOB: 1956-07-30  Phone: [phone] (home)     All above HIPAA information was verified with patient.     Specialty Medication(s):   Transplant: Prograf 0.5mg      Current Outpatient Medications   Medication Sig Dispense Refill   ??? amLODIPine (NORVASC) 10 MG tablet Take 1 tablet (10 mg total) by mouth daily. 90 tablet 3   ??? aspirin (ADULT LOW DOSE ASPIRIN) 81 MG tablet Take 1 tablet (81 mg total) by mouth daily. 90 tablet 3   ??? atorvastatin (LIPITOR) 80 MG tablet TAKE 1 TABLET (80 MG TOTAL) BY MOUTH DAILY. 30 tablet 11   ??? blood sugar diagnostic Strp CHECK BLOOD SUGAR BEFORE MEALS, AT BEDTIME, & AS NEEDED FOR HYPERGLYCEMIA UP TO 6 TIMES A DAY 200 strip 11   ??? chlorthalidone (HYGROTON) 25 MG tablet TAKE 1 TABLET BY MOUTH ONCE DAILY 30 each 11   ??? cholecalciferol, vitamin D3, 1,000 unit capsule Take 3,000 Units by mouth daily.     ??? diltiazem (CARDIZEM CD) 180 MG 24 hr capsule Take 2 capsule (360 mg) by mouth daily 60 capsule 11   ??? famotidine (PEPCID) 20 MG tablet Take 1 tablet (20 mg total) by mouth Two (2) times a day. 180 tablet 3   ??? levothyroxine (SYNTHROID) 75 MCG tablet TAKE 1 TABLET BY MOUTH ONCE DAILY 30 tablet 11   ??? lisinopriL (PRINIVIL,ZESTRIL) 40 MG tablet TAKE 1 TABLET (40 MG) BY MOUTH ONCE DAILY 30 tablet 11   ??? metFORMIN (GLUCOPHAGE) 1000 MG tablet Take 1,000 mg by mouth Two (2) times a day.     ??? mycophenolate (CELLCEPT) 500 mg tablet Take 1 tablet (500 mg total) by mouth Two (2) times a day. 180 tablet 3   ??? PROGRAF 0.5 mg capsule Take two capsules ( 1 mg ) in the morning and one capsule ( 0.5 mg ) in the evening 90 capsule 11     Current Facility-Administered Medications   Medication Dose Route Frequency Provider Last Rate Last Dose   ??? magnesium oxide (MAG-OX) tablet 800 mg  800 mg Oral Once Liliane Shi, MD            Changes to medications: Josede reports no changes at this time.    No Known Allergies    Changes to allergies: No    SPECIALTY MEDICATION ADHERENCE     Prograf 0.5mg   : 5 days of medicine on hand     Medication Adherence    Patient reported X missed doses in the last month:  0  Specialty Medication:  prograf 0.5mg           Specialty medication(s) dose(s) confirmed: Regimen is correct and unchanged.     Are there any concerns with adherence? No    Adherence counseling provided? Not needed    CLINICAL MANAGEMENT AND INTERVENTION      Clinical Benefit Assessment:    Do you feel the medicine is effective or helping your condition? Yes    Clinical Benefit counseling provided? Not needed    Adverse Effects Assessment:    Are you experiencing any side effects? No    Are you experiencing difficulty administering your medicine? No    Quality of Life Assessment:    How many days over the past month did your transplant  keep you from your normal activities? For example, brushing your teeth or getting up in the  morning. 0    Have you discussed this with your provider? Not needed    Therapy Appropriateness:    Is therapy appropriate? Yes, therapy is appropriate and should be continued    DISEASE/MEDICATION-SPECIFIC INFORMATION      N/A    PATIENT SPECIFIC NEEDS     ? Does the patient have any physical, cognitive, or cultural barriers? No    ? Is the patient high risk? No     ? Does the patient require a Care Management Plan? No     ? Does the patient require physician intervention or other additional services (i.e. nutrition, smoking cessation, social work)? No      SHIPPING     Specialty Medication(s) to be Shipped:   Transplant: Prograf 0.5mg     Other medication(s) to be shipped: atorvastatin, diltiazem, lisinopril, chlorthalidone, test strips, levothyroxine - 7rx total     Changes to insurance: No    Delivery Scheduled: Yes, Expected medication delivery date: 03/08/2019.  However, Rx request for refills was sent to the provider as there are none remaining.     Medication will be delivered via UPS to the confirmed home address in [facility].    The patient will receive a drug information handout for each medication shipped and additional FDA Medication Guides as required.  Verified that patient has previously received a Conservation officer, historic buildings.    All of the patient's questions and concerns have been addressed.    Thad Ranger   [facility] Specialty Pharmacist

## 2019-03-07 MED FILL — ACCU-CHEK AVIVA PLUS TEST STRIPS: 33 days supply | Qty: 200 | Fill #1 | Status: AC

## 2019-03-07 MED FILL — PROGRAF 0.5 MG CAPSULE: 30 days supply | Qty: 90 | Fill #0 | Status: AC

## 2019-03-07 MED FILL — ACCU-CHEK AVIVA PLUS TEST STRIPS: 33 days supply | Qty: 200 | Fill #1

## 2019-03-07 MED FILL — LEVOTHYROXINE 75 MCG TABLET: ORAL | 30 days supply | Qty: 30 | Fill #7

## 2019-03-07 MED FILL — LISINOPRIL 40 MG TABLET: 30 days supply | Qty: 30 | Fill #8 | Status: AC

## 2019-03-07 MED FILL — CHLORTHALIDONE 25 MG TABLET: 30 days supply | Qty: 30 | Fill #7 | Status: AC

## 2019-03-07 MED FILL — LEVOTHYROXINE 75 MCG TABLET: 30 days supply | Qty: 30 | Fill #7 | Status: AC

## 2019-03-07 MED FILL — DILTIAZEM CD 180 MG CAPSULE,EXTENDED RELEASE 24 HR: 30 days supply | Qty: 60 | Fill #5

## 2019-03-07 MED FILL — CHLORTHALIDONE 25 MG TABLET: ORAL | 30 days supply | Qty: 30 | Fill #7

## 2019-03-07 MED FILL — LISINOPRIL 40 MG TABLET: 30 days supply | Qty: 30 | Fill #8

## 2019-03-07 MED FILL — ATORVASTATIN 80 MG TABLET: 30 days supply | Qty: 30 | Fill #2

## 2019-03-07 MED FILL — DILTIAZEM CD 180 MG CAPSULE,EXTENDED RELEASE 24 HR: 30 days supply | Qty: 60 | Fill #5 | Status: AC

## 2019-03-07 MED FILL — ATORVASTATIN 80 MG TABLET: 30 days supply | Qty: 30 | Fill #2 | Status: AC

## 2019-03-13 ENCOUNTER — Encounter: Payer: Self-pay | Admitting: Family Medicine

## 2019-03-28 MED ORDER — TACROLIMUS 0.5 MG CAPSULE
ORAL_CAPSULE | ORAL | 11 refills | 30 days | Status: CP
Start: 2019-03-28 — End: ?
  Filled 2019-04-10: qty 90, 30d supply, fill #0

## 2019-03-29 NOTE — Unmapped (Signed)
Called patient and requested he have labs. His computer is down at this time, he has requested I mail him a lab slip. Will send when I am in the office next week.    Andrew Dickerson verbalized understanding & agreed with the plan.

## 2019-04-02 ENCOUNTER — Other Ambulatory Visit: Payer: Self-pay | Admitting: Family Medicine

## 2019-04-02 DIAGNOSIS — R7309 Other abnormal glucose: Secondary | ICD-10-CM

## 2019-04-06 LAB — HEMOGLOBIN A1C: Hemoglobin A1C: 6.8

## 2019-04-06 NOTE — Unmapped (Signed)
[facility] Shared [facility] Clinical Assessment & Refill Coordination Note    Andrew Dickerson, DOB: 09-23-1955  Phone: [phone] (home)     All above HIPAA information was verified with patient.     Specialty Medication(s):   Transplant: mycophenolate mofetil 500mg , Prograf 1mg  and tacrolimus 1mg      [facility] Pharmacist Intervention    Type of intervention: change from brand to generic of NTI drug    Medication: prograf/tac    Problem: clinic ok'ed change to generic tac, new rx sent over on file    Intervention: spoke with patient: he is aware of the change, need for labwork, and to contact clinic on date of switch. I will message coordinator today as well    Follow up needed: see above    Approximate time spent: 10 minutes    Andrew Dickerson   [facility] Specialty Pharmacist          Current Outpatient Medications   Medication Sig Dispense Refill   ??? amLODIPine (NORVASC) 10 MG tablet Take 1 tablet (10 mg total) by mouth daily. 90 tablet 3   ??? aspirin (ADULT LOW DOSE ASPIRIN) 81 MG tablet Take 1 tablet (81 mg total) by mouth daily. 90 tablet 3   ??? atorvastatin (LIPITOR) 80 MG tablet TAKE 1 TABLET (80 MG TOTAL) BY MOUTH DAILY. 30 tablet 11   ??? blood sugar diagnostic Strp CHECK BLOOD SUGAR BEFORE MEALS, AT BEDTIME, & AS NEEDED FOR HYPERGLYCEMIA UP TO 6 TIMES A DAY 200 strip 11   ??? chlorthalidone (HYGROTON) 25 MG tablet TAKE 1 TABLET BY MOUTH ONCE DAILY 30 each 11   ??? cholecalciferol, vitamin D3, 1,000 unit capsule Take 3,000 Units by mouth daily.     ??? diltiazem (CARDIZEM CD) 180 MG 24 hr capsule Take 2 capsule (360 mg) by mouth daily 60 capsule 11   ??? famotidine (PEPCID) 20 MG tablet Take 1 tablet (20 mg total) by mouth Two (2) times a day. 180 tablet 3   ??? levothyroxine (SYNTHROID) 75 MCG tablet TAKE 1 TABLET BY MOUTH ONCE DAILY 30 tablet 11   ??? lisinopriL (PRINIVIL,ZESTRIL) 40 MG tablet TAKE 1 TABLET (40 MG) BY MOUTH ONCE DAILY 30 tablet 11   ??? metFORMIN (GLUCOPHAGE) 1000 MG tablet Take 1,000 mg by mouth Two (2) times a day.     ??? mycophenolate (CELLCEPT) 500 mg tablet Take 1 tablet (500 mg total) by mouth Two (2) times a day. 180 tablet 3   ??? tacrolimus (PROGRAF) 0.5 MG capsule Take 2 capsules (1 mg total) by mouth daily AND 1 capsule (0.5 mg total) nightly. 90 capsule 11     Current Facility-Administered Medications   Medication Dose Route Frequency Provider Last Rate Last Dose   ??? magnesium oxide (MAG-OX) tablet 800 mg  800 mg Oral Once Liliane Shi, MD            Changes to medications: Suhaib reports no changes at this time.    No Known Allergies    Changes to allergies: No    SPECIALTY MEDICATION ADHERENCE     Tacrolimus 0.5mg   : 0 days of medicine on hand - will switch on this fill  Prograf 0.5mg   : 12 days of medicine on hand   Mycophenolate 500mg   : 12 days of medicine on hand     Medication Adherence    Patient reported X missed doses in the last month: 0  Specialty Medication: prograf 0.5mg   Patient is on additional specialty medications: Yes  Additional Specialty Medications: Mycophenolate 500mg   Patient Reported Additional Medication X Missed Doses in the Last Month: 0          Specialty medication(s) dose(s) confirmed: Regimen is correct and unchanged.     Are there any concerns with adherence? No    Adherence counseling provided? Not needed    CLINICAL MANAGEMENT AND INTERVENTION      Clinical Benefit Assessment:    Do you feel the medicine is effective or helping your condition? Yes    Clinical Benefit counseling provided? Not needed    Adverse Effects Assessment:    Are you experiencing any side effects? No    Are you experiencing difficulty administering your medicine? No    Quality of Life Assessment:    How many days over the past month did your transplant keep you from your normal activities? For example, brushing your teeth or getting up in the morning. 0    Have you discussed this with your provider? Not needed    Therapy Appropriateness:    Is therapy appropriate? Yes, therapy is appropriate and should be continued    DISEASE/MEDICATION-SPECIFIC INFORMATION      N/A    PATIENT SPECIFIC NEEDS     ? Does the patient have any physical, cognitive, or cultural barriers? No    ? Is the patient high risk? Yes, patient taking a REMS drug     ? Does the patient require a Care Management Plan? No     ? Does the patient require physician intervention or other additional services (i.e. nutrition, smoking cessation, social work)? No      SHIPPING     Specialty Medication(s) to be Shipped:   Transplant: mycophenolate mofetil 500mg  and tacrolimus 0.5mg     Other medication(s) to be shipped: test strips, lipitor, diltiazem, lisinopril, chlorthalidone, levothyroxine-      Changes to insurance: No    Delivery Scheduled: Yes, Expected medication delivery date: 04/11/2019.     Medication will be delivered via UPS to the confirmed home address in [facility].    The patient will receive a drug information handout for each medication shipped and additional FDA Medication Guides as required.  Verified that patient has previously received a Conservation officer, historic buildings.    All of the patient's questions and concerns have been addressed.    Andrew Dickerson   [facility] Specialty Pharmacist

## 2019-04-10 MED FILL — LISINOPRIL 40 MG TABLET: 30 days supply | Qty: 30 | Fill #9

## 2019-04-10 MED FILL — DILTIAZEM CD 180 MG CAPSULE,EXTENDED RELEASE 24 HR: 30 days supply | Qty: 60 | Fill #6 | Status: AC

## 2019-04-10 MED FILL — ACCU-CHEK AVIVA PLUS TEST STRIPS: 33 days supply | Qty: 200 | Fill #2

## 2019-04-10 MED FILL — TACROLIMUS 0.5 MG CAPSULE: 30 days supply | Qty: 90 | Fill #0 | Status: AC

## 2019-04-10 MED FILL — LEVOTHYROXINE 75 MCG TABLET: ORAL | 30 days supply | Qty: 30 | Fill #8

## 2019-04-10 MED FILL — ATORVASTATIN 80 MG TABLET: 30 days supply | Qty: 30 | Fill #3

## 2019-04-10 MED FILL — LEVOTHYROXINE 75 MCG TABLET: 30 days supply | Qty: 30 | Fill #8 | Status: AC

## 2019-04-10 MED FILL — ATORVASTATIN 80 MG TABLET: 30 days supply | Qty: 30 | Fill #3 | Status: AC

## 2019-04-10 MED FILL — ACCU-CHEK AVIVA PLUS TEST STRIPS: 33 days supply | Qty: 200 | Fill #2 | Status: AC

## 2019-04-10 MED FILL — DILTIAZEM CD 180 MG CAPSULE,EXTENDED RELEASE 24 HR: 30 days supply | Qty: 60 | Fill #6

## 2019-04-10 MED FILL — LISINOPRIL 40 MG TABLET: 30 days supply | Qty: 30 | Fill #9 | Status: AC

## 2019-04-10 MED FILL — CHLORTHALIDONE 25 MG TABLET: ORAL | 30 days supply | Qty: 30 | Fill #8

## 2019-04-10 MED FILL — CHLORTHALIDONE 25 MG TABLET: 30 days supply | Qty: 30 | Fill #8 | Status: AC

## 2019-04-19 ENCOUNTER — Telehealth: Payer: Self-pay | Admitting: Family Medicine

## 2019-04-19 NOTE — Telephone Encounter (Signed)
I called the patient to schedule his AWV with Loma Sousa.  He asked that I call him back next week. VDM (Dee-Dee)

## 2019-04-25 DIAGNOSIS — R7989 Other specified abnormal findings of blood chemistry: Secondary | ICD-10-CM | POA: Diagnosis not present

## 2019-04-25 DIAGNOSIS — Z48298 Encounter for aftercare following other organ transplant: Secondary | ICD-10-CM | POA: Diagnosis not present

## 2019-04-25 DIAGNOSIS — E785 Hyperlipidemia, unspecified: Secondary | ICD-10-CM | POA: Diagnosis not present

## 2019-04-25 DIAGNOSIS — Z79899 Other long term (current) drug therapy: Secondary | ICD-10-CM | POA: Diagnosis not present

## 2019-04-25 DIAGNOSIS — Z941 Heart transplant status: Secondary | ICD-10-CM | POA: Diagnosis not present

## 2019-04-30 LAB — CBC W/ DIFFERENTIAL
BASOPHILS ABSOLUTE COUNT: 0.1 10*9/L
BASOPHILS RELATIVE PERCENT: 0.8 %
EOSINOPHILS ABSOLUTE COUNT: 0.2 10*9/L
EOSINOPHILS RELATIVE PERCENT: 2.5 %
HEMATOCRIT: 41.3 %
HEMOGLOBIN: 13.6 g/dL
LYMPHOCYTES ABSOLUTE COUNT: 1.2 10*9/L
MEAN CORPUSCULAR HEMOGLOBIN CONC: 32.9 g/dL
MEAN CORPUSCULAR HEMOGLOBIN: 29.6 pg
MEAN CORPUSCULAR VOLUME: 89.8 fL
MEAN PLATELET VOLUME: 10.1 fL
MONOCYTES ABSOLUTE COUNT: 0.6 10*9/L
MONOCYTES RELATIVE PERCENT: 6.6 %
NEUTROPHILS ABSOLUTE COUNT: 6.5 10*9/L
PLATELET COUNT: 289 10*9/L
RED BLOOD CELL COUNT: 4.6 10*12/L
RED CELL DISTRIBUTION WIDTH: 13.1 %
WHITE BLOOD CELL COUNT: 8.5 10*9/L

## 2019-04-30 LAB — COMPREHENSIVE METABOLIC PANEL
ALKALINE PHOSPHATASE: 72 U/L
ALT (SGPT): 16 U/L
BILIRUBIN TOTAL: 0.5 mg/dL
CALCIUM: 9.8 mg/dL
CHLORIDE: 108 mmol/L
CREATININE: 1.14 mg/dL
EGFR CKD-EPI AA MALE: 79 mL/min/{1.73_m2}
EGFR CKD-EPI NON-AA MALE: 69 mL/min/{1.73_m2}
GLUCOSE RANDOM: 130 mg/dL — ABNORMAL HIGH
POTASSIUM: 4.9 mmol/L
PROTEIN TOTAL: 6.9 g/dL
SODIUM: 138 mmol/L

## 2019-04-30 LAB — TRIGLYCERIDES: Lab: 140

## 2019-04-30 LAB — LIPID PANEL
CHOLESTEROL/HDL RATIO SCREEN: 4.9
LDL CHOLESTEROL CALCULATED: 109 mg/dL — ABNORMAL HIGH
NON-HDL CHOLESTEROL: 134 mg/dL — ABNORMAL HIGH
TRIGLYCERIDES: 140 mg/dL

## 2019-04-30 LAB — THYROID STIMULATING HORMONE: Lab: 1.4

## 2019-04-30 LAB — MEAN CORPUSCULAR HEMOGLOBIN: Lab: 29.6

## 2019-04-30 LAB — TSH: THYROID STIMULATING HORMONE: 1.4 u[IU]/mL

## 2019-04-30 LAB — MAGNESIUM: Lab: 1.4 — ABNORMAL LOW

## 2019-04-30 LAB — TACROLIMUS, TROUGH: Lab: 4.2 — ABNORMAL LOW

## 2019-04-30 LAB — HEMOGLOBIN A1C: Lab: 6.3 — ABNORMAL HIGH

## 2019-04-30 LAB — ALBUMIN: Lab: 4.4

## 2019-04-30 LAB — AST (SGOT): Lab: 11

## 2019-05-02 NOTE — Unmapped (Signed)
Discussed recent labs with Edgar Frisk, PharmD.  Plan is to Make No Changes  with repeat labs in 3 Months.  Mr Minus reports feeling well. No issues, no refills needed. Has been staying in due to Covid, recommended diet modifications and exercise to improve lipid panel and prevent A1C from increasing further.  Mr. Jares verbalized understanding & agreed with the plan.        Lab Results   Component Value Date    TACROLIMUS 4.2 (L) 04/25/2019     Goal: Tac: 5-8  Current Dose: 1 mg/0.5 mg    Lab Results   Component Value Date    BUN 21 04/25/2019    CREATININE 1.14 04/25/2019    K 4.9 04/25/2019    GLU 130 (H) 04/25/2019    MG 1.4 (L) 04/25/2019     Lab Results   Component Value Date    WBC 8.5 04/25/2019    HGB 13.6 04/25/2019    HCT 41.3 04/25/2019    PLT 289 04/25/2019    NEUTROABS 6.5 04/25/2019    EOSABS 0.2 04/25/2019

## 2019-05-03 MED ORDER — LISINOPRIL 40 MG TABLET
ORAL_TABLET | Freq: Every day | ORAL | 11 refills | 30 days | Status: CP
Start: 2019-05-03 — End: ?
  Filled 2019-05-09: qty 30, 30d supply, fill #0

## 2019-05-03 NOTE — Unmapped (Signed)
[facility] Refill Coordination Note    Specialty Medication(s) to be Shipped:   Transplant: mycophenolate mofetil 500mg  and tacrolimus 0.5mg     Other medication(s) to be shipped:   Lisinopril  Aspirin  Amlodipine   Levothroxine  Diltiazem  Chlorthalidone  Atorvastin      [name], DOB: [birth year]  Phone: [phone] (home)       All above HIPAA information was verified with patient.     Completed refill call assessment today to schedule patient's medication shipment from the [facility] [phone]).       Specialty medication(s) and dose(s) confirmed: Regimen is correct and unchanged.   Changes to medications: Canden reports no changes at this time.  Changes to insurance: No  Questions for the pharmacist: No    Confirmed patient received Welcome Packet with first shipment. The patient will receive a drug information handout for each medication shipped and additional FDA Medication Guides as required.       DISEASE/MEDICATION-SPECIFIC INFORMATION        N/A    SPECIALTY MEDICATION ADHERENCE     Medication Adherence    Patient reported X missed doses in the last month: 0        Mycophenolate 500 mg: 10 days of medicine on hand   Tacrolimus 0.5 mg: 10 days of medicine on hand           SHIPPING     Shipping address confirmed in Epic.     Delivery Scheduled: Yes, Expected medication delivery date: 05/10/19.     Medication will be delivered via UPS to the home address in [employer] [place].    [place] A Rheana Casebolt   [facility] Shared [facility] Specialty Technician

## 2019-05-09 MED FILL — TACROLIMUS 0.5 MG CAPSULE, IMMEDIATE-RELEASE: ORAL | 30 days supply | Qty: 90 | Fill #1

## 2019-05-09 MED FILL — LEVOTHYROXINE 75 MCG TABLET: ORAL | 30 days supply | Qty: 30 | Fill #9

## 2019-05-09 MED FILL — AMLODIPINE 10 MG TABLET: ORAL | 90 days supply | Qty: 90 | Fill #3

## 2019-05-09 MED FILL — LISINOPRIL 40 MG TABLET: 30 days supply | Qty: 30 | Fill #0 | Status: AC

## 2019-05-09 MED FILL — MYCOPHENOLATE MOFETIL 500 MG TABLET: ORAL | 30 days supply | Qty: 60 | Fill #3

## 2019-05-09 MED FILL — DILTIAZEM CD 180 MG CAPSULE,EXTENDED RELEASE 24 HR: 30 days supply | Qty: 60 | Fill #7

## 2019-05-09 MED FILL — ASPIRIN 81 MG TABLET,DELAYED RELEASE: ORAL | 90 days supply | Qty: 90 | Fill #3

## 2019-05-09 MED FILL — ASPIRIN 81 MG TABLET,DELAYED RELEASE: 90 days supply | Qty: 90 | Fill #3 | Status: AC

## 2019-05-09 MED FILL — CHLORTHALIDONE 25 MG TABLET: 30 days supply | Qty: 30 | Fill #9 | Status: AC

## 2019-05-09 MED FILL — LEVOTHYROXINE 75 MCG TABLET: 30 days supply | Qty: 30 | Fill #9 | Status: AC

## 2019-05-09 MED FILL — TACROLIMUS 0.5 MG CAPSULE: 30 days supply | Qty: 90 | Fill #1 | Status: AC

## 2019-05-09 MED FILL — AMLODIPINE 10 MG TABLET: 90 days supply | Qty: 90 | Fill #3 | Status: AC

## 2019-05-09 MED FILL — ATORVASTATIN 80 MG TABLET: 30 days supply | Qty: 30 | Fill #4 | Status: AC

## 2019-05-09 MED FILL — MYCOPHENOLATE MOFETIL 500 MG TABLET: 30 days supply | Qty: 60 | Fill #3 | Status: AC

## 2019-05-09 MED FILL — ATORVASTATIN 80 MG TABLET: 30 days supply | Qty: 30 | Fill #4

## 2019-05-09 MED FILL — DILTIAZEM CD 180 MG CAPSULE,EXTENDED RELEASE 24 HR: 30 days supply | Qty: 60 | Fill #7 | Status: AC

## 2019-05-09 MED FILL — CHLORTHALIDONE 25 MG TABLET: ORAL | 30 days supply | Qty: 30 | Fill #9

## 2019-05-17 ENCOUNTER — Encounter: Payer: Self-pay | Admitting: Family Medicine

## 2019-05-30 NOTE — Unmapped (Signed)
[facility] Pharm[facility] Refill Coordination Note    Specialty Medication(s) to be Shipped:   Transplant: tacrolimus 0.5mg     Other medication(s) to be shipped:   Lisinopril  Levothroxine  Diltiazem  Chlorthalidone  Atorvastatin     **PT denied Cellcept     Andrew Dickerson, DOB: [birth year]  Phone: [phone] (home)       All above HIPAA information was verified with patient.     Completed refill call assessment today to schedule patient's medication shipment from the [facility] [phone]).       Specialty medication(s) and dose(s) confirmed: Regimen is correct and unchanged.   Changes to medications: Andrew Dickerson reports no changes at this time.  Changes to insurance: No  Questions for the pharmacist: No    Confirmed patient received Welcome Packet with first shipment. The patient will receive a drug information handout for each medication shipped and additional FDA Medication Guides as required.       DISEASE/MEDICATION-SPECIFIC INFORMATION        N/A    SPECIALTY MEDICATION ADHERENCE     Medication Adherence    Patient reported X missed doses in the last month: 0       tacrolimus 0.5mg : 10 days worth of medication on hand.            SHIPPING     Shipping address confirmed in Epic.     Delivery Scheduled: Yes, Expected medication delivery date: 06/06/19.     Medication will be delivered via UPS to the home address in Epic [facility].    Andrew Dickerson   [facility] Shared [facility] Specialty Technician

## 2019-06-05 MED FILL — CHLORTHALIDONE 25 MG TABLET: ORAL | 30 days supply | Qty: 30 | Fill #10

## 2019-06-05 MED FILL — LEVOTHYROXINE 75 MCG TABLET: ORAL | 30 days supply | Qty: 30 | Fill #10

## 2019-06-05 MED FILL — ATORVASTATIN 80 MG TABLET: 30 days supply | Qty: 30 | Fill #5 | Status: AC

## 2019-06-05 MED FILL — LISINOPRIL 40 MG TABLET: 30 days supply | Qty: 30 | Fill #1 | Status: AC

## 2019-06-05 MED FILL — FAMOTIDINE 20 MG TABLET: 88 days supply | Qty: 175 | Fill #2 | Status: AC

## 2019-06-05 MED FILL — CHLORTHALIDONE 25 MG TABLET: 30 days supply | Qty: 30 | Fill #10 | Status: AC

## 2019-06-05 MED FILL — LISINOPRIL 40 MG TABLET: ORAL | 30 days supply | Qty: 30 | Fill #1

## 2019-06-05 MED FILL — TACROLIMUS 0.5 MG CAPSULE, IMMEDIATE-RELEASE: ORAL | 30 days supply | Qty: 90 | Fill #2

## 2019-06-05 MED FILL — LEVOTHYROXINE 75 MCG TABLET: 30 days supply | Qty: 30 | Fill #10 | Status: AC

## 2019-06-05 MED FILL — TACROLIMUS 0.5 MG CAPSULE: 30 days supply | Qty: 90 | Fill #2 | Status: AC

## 2019-06-05 MED FILL — ATORVASTATIN 80 MG TABLET: 30 days supply | Qty: 30 | Fill #5

## 2019-06-05 MED FILL — DILTIAZEM CD 180 MG CAPSULE,EXTENDED RELEASE 24 HR: 30 days supply | Qty: 60 | Fill #8 | Status: AC

## 2019-06-05 MED FILL — FAMOTIDINE 20 MG TABLET: ORAL | 88 days supply | Qty: 175 | Fill #2

## 2019-06-05 MED FILL — DILTIAZEM CD 180 MG CAPSULE,EXTENDED RELEASE 24 HR: 30 days supply | Qty: 60 | Fill #8

## 2019-06-09 DIAGNOSIS — E119 Type 2 diabetes mellitus without complications: Secondary | ICD-10-CM | POA: Diagnosis not present

## 2019-06-09 DIAGNOSIS — H40033 Anatomical narrow angle, bilateral: Secondary | ICD-10-CM | POA: Diagnosis not present

## 2019-06-27 ENCOUNTER — Telehealth: Payer: Self-pay | Admitting: Family Medicine

## 2019-06-27 NOTE — Telephone Encounter (Signed)
I left a message asking the patient to call and schedule Medicare AWV with Courtney ([facility] Health Coach).  If patient calls back, please schedule Medicare Wellness Visit at next available opening.  VDM (Dee-Dee) °

## 2019-07-05 NOTE — Unmapped (Signed)
[facility] Pharm[facility] Refill Coordination Note    Specialty Medication(s) to be Shipped:   Transplant: tacrolimus 0.5mg     Other medication(s) to be shipped:   Lisinopril  Levothroxine  Diltiazem  Chlorthalidone  Atorvastatin     **PT denied Cellcept     MANUELITO POAGE, DOB: 1955/12/04  Phone: [phone] (home)       All above HIPAA information was verified with patient.     Completed refill call assessment today to schedule patient's medication shipment from the [facility] [phone]).       Specialty medication(s) and dose(s) confirmed: Regimen is correct and unchanged.   Changes to medications: Mekiah reports no changes at this time.  Changes to insurance: No  Questions for the pharmacist: No    Confirmed patient received Welcome Packet with first shipment. The patient will receive a drug information handout for each medication shipped and additional FDA Medication Guides as required.       DISEASE/MEDICATION-SPECIFIC INFORMATION        N/A    SPECIALTY MEDICATION ADHERENCE     Medication Adherence    Patient reported X missed doses in the last month: 0  Specialty Medication: Tacrolimus 0.5 mg  Patient is on additional specialty medications: Yes  Additional Specialty Medications: Mycophenolate 500 mg  Patient Reported Additional Medication X Missed Doses in the Last Month: 0  Patient is on more than two specialty medications: No  Demonstrates understanding of importance of adherence: yes  Informant: patient  Reliability of informant: reliable  Confirmed plan for next specialty medication refill: delivery by pharmacy  Refills needed for supportive medications: not needed       tacrolimus 0.5mg : 10 days worth of medication on hand.              SHIPPING     Shipping address confirmed in Epic.     Delivery Scheduled: Yes, Expected medication delivery date: [id number].     Medication will be delivered via UPS to the prescription address in Epic WAM.    Declyn Offield D Diann Bangerter [facility] Shared [facility] Specialty Technician

## 2019-07-09 MED FILL — DILTIAZEM CD 180 MG CAPSULE,EXTENDED RELEASE 24 HR: 30 days supply | Qty: 60 | Fill #9 | Status: AC

## 2019-07-09 MED FILL — TACROLIMUS 0.5 MG CAPSULE: 30 days supply | Qty: 90 | Fill #3 | Status: AC

## 2019-07-09 MED FILL — LISINOPRIL 40 MG TABLET: ORAL | 30 days supply | Qty: 30 | Fill #2

## 2019-07-09 MED FILL — TACROLIMUS 0.5 MG CAPSULE, IMMEDIATE-RELEASE: ORAL | 30 days supply | Qty: 90 | Fill #3

## 2019-07-09 MED FILL — DILTIAZEM CD 180 MG CAPSULE,EXTENDED RELEASE 24 HR: 30 days supply | Qty: 60 | Fill #9

## 2019-07-09 MED FILL — LEVOTHYROXINE 75 MCG TABLET: 30 days supply | Qty: 30 | Fill #11 | Status: AC

## 2019-07-09 MED FILL — ATORVASTATIN 80 MG TABLET: 30 days supply | Qty: 30 | Fill #6

## 2019-07-09 MED FILL — CHLORTHALIDONE 25 MG TABLET: 30 days supply | Qty: 30 | Fill #11 | Status: AC

## 2019-07-09 MED FILL — ATORVASTATIN 80 MG TABLET: 30 days supply | Qty: 30 | Fill #6 | Status: AC

## 2019-07-09 MED FILL — LEVOTHYROXINE 75 MCG TABLET: ORAL | 30 days supply | Qty: 30 | Fill #11

## 2019-07-09 MED FILL — CHLORTHALIDONE 25 MG TABLET: ORAL | 30 days supply | Qty: 30 | Fill #11

## 2019-07-09 MED FILL — LISINOPRIL 40 MG TABLET: 30 days supply | Qty: 30 | Fill #2 | Status: AC

## 2019-07-14 ENCOUNTER — Other Ambulatory Visit: Payer: Self-pay | Admitting: Family Medicine

## 2019-07-14 DIAGNOSIS — R7309 Other abnormal glucose: Secondary | ICD-10-CM

## 2019-08-01 ENCOUNTER — Other Ambulatory Visit: Payer: Self-pay

## 2019-08-01 DIAGNOSIS — Z20822 Contact with and (suspected) exposure to covid-19: Secondary | ICD-10-CM

## 2019-08-03 LAB — NOVEL CORONAVIRUS, NAA: SARS-CoV-2, NAA: NOT DETECTED

## 2019-08-06 DIAGNOSIS — Z79899 Other long term (current) drug therapy: Secondary | ICD-10-CM | POA: Diagnosis not present

## 2019-08-06 DIAGNOSIS — Z941 Heart transplant status: Secondary | ICD-10-CM | POA: Diagnosis not present

## 2019-08-06 DIAGNOSIS — Z48298 Encounter for aftercare following other organ transplant: Secondary | ICD-10-CM | POA: Diagnosis not present

## 2019-08-06 DIAGNOSIS — R7989 Other specified abnormal findings of blood chemistry: Secondary | ICD-10-CM | POA: Diagnosis not present

## 2019-08-06 NOTE — Unmapped (Signed)
[facility] Clinical Assessment & Refill Coordination Note    Andrew Dickerson, DOB: 1956/07/18  Phone: [phone] (home)     All above HIPAA information was verified with patient.     Specialty Medication(s):   Transplant: tacrolimus 0.5mg      Current Outpatient Medications   Medication Sig Dispense Refill   ??? amLODIPine (NORVASC) 10 MG tablet Take 1 tablet (10 mg total) by mouth daily. 90 tablet 3   ??? aspirin (ADULT LOW DOSE ASPIRIN) 81 MG tablet Take 1 tablet (81 mg total) by mouth daily. 90 tablet 3   ??? atorvastatin (LIPITOR) 80 MG tablet TAKE 1 TABLET (80 MG TOTAL) BY MOUTH DAILY. 30 tablet 11   ??? blood sugar diagnostic Strp CHECK BLOOD SUGAR BEFORE MEALS, AT BEDTIME, & AS NEEDED FOR HYPERGLYCEMIA UP TO 6 TIMES A DAY 200 strip 11   ??? chlorthalidone (HYGROTON) 25 MG tablet TAKE 1 TABLET BY MOUTH ONCE DAILY 30 each 11   ??? cholecalciferol, vitamin D3, 1,000 unit capsule Take 3,000 Units by mouth daily.     ??? diltiazem (CARDIZEM CD) 180 MG 24 hr capsule Take 2 capsule (360 mg) by mouth daily 60 capsule 11   ??? famotidine (PEPCID) 20 MG tablet Take 1 tablet (20 mg total) by mouth Two (2) times a day. 180 tablet 3   ??? levothyroxine (SYNTHROID) 75 MCG tablet TAKE 1 TABLET BY MOUTH ONCE DAILY 30 tablet 11   ??? lisinopriL (PRINIVIL,ZESTRIL) 40 MG tablet Take 1 tablet (40 mg total) by mouth daily. 30 tablet 11   ??? metFORMIN (GLUCOPHAGE) 1000 MG tablet Take 1,000 mg by mouth Two (2) times a day.     ??? tacrolimus (PROGRAF) 0.5 MG capsule Take 2 capsules (1 mg total) by mouth daily AND 1 capsule (0.5 mg total) nightly. 90 capsule 11     Current Facility-Administered Medications   Medication Dose Route Frequency Provider Last Rate Last Dose   ??? magnesium oxide (MAG-OX) tablet 800 mg  800 mg Oral Once Liliane Shi, MD            Changes to medications: [name] reports no changes at this time.    No Known Allergies    Changes to allergies: No    SPECIALTY MEDICATION ADHERENCE Tacrolimus 0.5 mg: 7 days of medicine on hand       Medication Adherence    Patient reported X missed doses in the last month: 1  Specialty Medication: Tacrolimus 0.5mg   Patient is on additional specialty medications: No          Specialty medication(s) dose(s) confirmed: Regimen is correct and unchanged.     Are there any concerns with adherence? No    Adherence counseling provided? Not needed    CLINICAL MANAGEMENT AND INTERVENTION      Clinical Benefit Assessment:    Do you feel the medicine is effective or helping your condition? Yes    Clinical Benefit counseling provided? Not needed    Adverse Effects Assessment:    Are you experiencing any side effects? No    Are you experiencing difficulty administering your medicine? No    Quality of Life Assessment:    How many days over the past month did your heart transplant  keep you from your normal activities? For example, brushing your teeth or getting up in the morning. 0    Have you discussed this with your provider? Not needed    Therapy Appropriateness:    Is  therapy appropriate? Yes, therapy is appropriate and should be continued    DISEASE/MEDICATION-SPECIFIC INFORMATION      N/A    PATIENT SPECIFIC NEEDS     ? Does the patient have any physical, cognitive, or cultural barriers? No    ? Is the patient high risk? Yes, patient taking a REMS drug     ? Does the patient require a Care Management Plan? No     ? Does the patient require physician intervention or other additional services (i.e. nutrition, smoking cessation, social work)? No      SHIPPING     Specialty Medication(s) to be Shipped:   Transplant: tacrolimus 0.5mg     Other medication(s) to be shipped: atorvastatin, chlorthalidone, diltiazem,levothyroxine, lisinopril     Changes to insurance: No    Delivery Scheduled: Yes, Expected medication delivery date: 08/09/2019.     Medication will be delivered via UPS to the confirmed prescription address in [facility]. The patient will receive a drug information handout for each medication shipped and additional FDA Medication Guides as required.  Verified that patient has previously received a Conservation officer, historic buildings.    All of the patient's questions and concerns have been addressed.    [name]   [facility] Specialty Pharmacist

## 2019-08-07 MED ORDER — CHLORTHALIDONE 25 MG TABLET
ORAL_TABLET | Freq: Every day | ORAL | 11 refills | 30 days | Status: CP
Start: 2019-08-07 — End: 2020-08-06
  Filled 2019-08-09: qty 30, 30d supply, fill #0

## 2019-08-07 MED ORDER — DILTIAZEM CD 180 MG CAPSULE,EXTENDED RELEASE 24 HR
ORAL_CAPSULE | Freq: Every day | ORAL | 11 refills | 30.00000 days | Status: CP
Start: 2019-08-07 — End: ?
  Filled 2019-08-09: qty 60, 30d supply, fill #0

## 2019-08-07 MED ORDER — LEVOTHYROXINE 75 MCG TABLET
ORAL_TABLET | Freq: Every day | ORAL | 11 refills | 30 days | Status: CP
Start: 2019-08-07 — End: 2020-08-06
  Filled 2019-08-09: qty 30, 30d supply, fill #0

## 2019-08-09 LAB — COMPREHENSIVE METABOLIC PANEL
ALKALINE PHOSPHATASE: 67 U/L
ALT (SGPT): 13 U/L
AST (SGOT): 10 U/L
CALCIUM: 9.8 mg/dL
CHLORIDE: 105 mmol/L
CO2: 16 mmol/L — ABNORMAL LOW
CREATININE: 1.07 mg/dL
EGFR CKD-EPI AA MALE: 86 mL/min/{1.73_m2}
EGFR CKD-EPI NON-AA MALE: 74 mL/min/{1.73_m2}
GLUCOSE RANDOM: 132 mg/dL — ABNORMAL HIGH
POTASSIUM: 4.8 mmol/L
SODIUM: 136 mmol/L

## 2019-08-09 LAB — CBC W/ DIFFERENTIAL
BASOPHILS ABSOLUTE COUNT: 0.1 10*9/L
BASOPHILS RELATIVE PERCENT: 0.8 %
EOSINOPHILS ABSOLUTE COUNT: 0.3 10*9/L
EOSINOPHILS RELATIVE PERCENT: 2.9 %
HEMOGLOBIN: 13.7 g/dL
LYMPHOCYTES ABSOLUTE COUNT: 1.3 10*9/L
LYMPHOCYTES RELATIVE PERCENT: 15.1 %
MEAN CORPUSCULAR HEMOGLOBIN CONC: 32.8 g/dL
MEAN CORPUSCULAR HEMOGLOBIN: 28.8 pg
MEAN CORPUSCULAR VOLUME: 88 fL
MEAN PLATELET VOLUME: 10.6 fL
MONOCYTES RELATIVE PERCENT: 6.6 %
NEUTROPHILS ABSOLUTE COUNT: 6.5 10*9/L
NEUTROPHILS RELATIVE PERCENT: 74.6 %
PLATELET COUNT: 304 10*9/L
RED BLOOD CELL COUNT: 4.75 10*12/L
RED CELL DISTRIBUTION WIDTH: 13.1 %
WBC ADJUSTED: 8.7 10*9/L

## 2019-08-09 LAB — ALBUMIN: Lab: 4.7

## 2019-08-09 LAB — WHITE BLOOD CELL COUNT: Lab: 8.7

## 2019-08-09 LAB — EGFR CKD-EPI NON-AA FEMALE: Lab: 0

## 2019-08-09 LAB — MAGNESIUM: Lab: 1.3 — ABNORMAL LOW

## 2019-08-09 LAB — TACROLIMUS, TROUGH: Lab: 4.2 — ABNORMAL LOW

## 2019-08-09 MED FILL — DILTIAZEM CD 180 MG CAPSULE,EXTENDED RELEASE 24 HR: 30 days supply | Qty: 60 | Fill #0 | Status: AC

## 2019-08-09 MED FILL — TACROLIMUS 0.5 MG CAPSULE: 30 days supply | Qty: 90 | Fill #4 | Status: AC

## 2019-08-09 MED FILL — LISINOPRIL 40 MG TABLET: ORAL | 30 days supply | Qty: 30 | Fill #3

## 2019-08-09 MED FILL — LISINOPRIL 40 MG TABLET: 30 days supply | Qty: 30 | Fill #3 | Status: AC

## 2019-08-09 MED FILL — ATORVASTATIN 80 MG TABLET: 30 days supply | Qty: 30 | Fill #7

## 2019-08-09 MED FILL — TACROLIMUS 0.5 MG CAPSULE, IMMEDIATE-RELEASE: ORAL | 30 days supply | Qty: 90 | Fill #4

## 2019-08-09 MED FILL — LEVOTHYROXINE 75 MCG TABLET: 30 days supply | Qty: 30 | Fill #0 | Status: AC

## 2019-08-09 MED FILL — CHLORTHALIDONE 25 MG TABLET: 30 days supply | Qty: 30 | Fill #0 | Status: AC

## 2019-08-09 MED FILL — ATORVASTATIN 80 MG TABLET: 30 days supply | Qty: 30 | Fill #7 | Status: AC

## 2019-08-13 ENCOUNTER — Ambulatory Visit: Admit: 2019-08-13 | Discharge: 2019-08-13 | Payer: MEDICARE

## 2019-08-13 DIAGNOSIS — E785 Hyperlipidemia, unspecified: Secondary | ICD-10-CM | POA: Diagnosis not present

## 2019-08-13 DIAGNOSIS — E119 Type 2 diabetes mellitus without complications: Secondary | ICD-10-CM | POA: Diagnosis not present

## 2019-08-13 DIAGNOSIS — I255 Ischemic cardiomyopathy: Secondary | ICD-10-CM | POA: Diagnosis not present

## 2019-08-13 DIAGNOSIS — E039 Hypothyroidism, unspecified: Secondary | ICD-10-CM | POA: Diagnosis not present

## 2019-08-13 DIAGNOSIS — Z87891 Personal history of nicotine dependence: Secondary | ICD-10-CM | POA: Diagnosis not present

## 2019-08-13 DIAGNOSIS — I10 Essential (primary) hypertension: Secondary | ICD-10-CM | POA: Diagnosis not present

## 2019-08-13 DIAGNOSIS — Z941 Heart transplant status: Secondary | ICD-10-CM | POA: Diagnosis not present

## 2019-08-13 DIAGNOSIS — I499 Cardiac arrhythmia, unspecified: Secondary | ICD-10-CM | POA: Diagnosis not present

## 2019-08-13 DIAGNOSIS — Z4821 Encounter for aftercare following heart transplant: Secondary | ICD-10-CM | POA: Diagnosis not present

## 2019-08-13 LAB — BASIC METABOLIC PANEL
ANION GAP: 10 mmol/L (ref 7–15)
BLOOD UREA NITROGEN: 29 mg/dL — ABNORMAL HIGH (ref 7–21)
BUN / CREAT RATIO: 23
CALCIUM: 9.6 mg/dL (ref 8.5–10.2)
CHLORIDE: 106 mmol/L (ref 98–107)
CO2: 20 mmol/L — ABNORMAL LOW (ref 22.0–30.0)
EGFR CKD-EPI AA MALE: 69 mL/min/{1.73_m2} (ref >=60)
EGFR CKD-EPI NON-AA MALE: 60 mL/min/{1.73_m2} (ref >=60)
GLUCOSE RANDOM: 190 mg/dL — ABNORMAL HIGH (ref 70–179)
SODIUM: 136 mmol/L (ref 135–145)

## 2019-08-13 LAB — LIPID PANEL
CHOLESTEROL/HDL RATIO SCREEN: 5.4 — ABNORMAL HIGH (ref <5.0)
CHOLESTEROL: 150 mg/dL (ref 100–199)
HDL CHOLESTEROL: 28 mg/dL — ABNORMAL LOW (ref 40–59)
LDL CHOLESTEROL CALCULATED: 100 mg/dL — ABNORMAL HIGH (ref 60–99)
VLDL CHOLESTEROL CAL: 22.2 mg/dL (ref 12–42)

## 2019-08-13 LAB — THYROID STIMULATING HORMONE: Thyrotropin:ACnc:Pt:Ser/Plas:Qn:: 1.115

## 2019-08-13 LAB — LDL CHOLESTEROL CALCULATED: Cholesterol.in LDL:MCnc:Pt:Ser/Plas:Qn:Calculated: 100 — ABNORMAL HIGH

## 2019-08-13 LAB — BLOOD UREA NITROGEN: Urea nitrogen:MCnc:Pt:Ser/Plas:Qn:: 29 — ABNORMAL HIGH

## 2019-08-13 LAB — ESTIMATED AVERAGE GLUCOSE: Estimated average glucose:MCnc:Pt:Bld:Qn:Estimated from glycated hemoglobin: 126

## 2019-08-13 LAB — HEMOGLOBIN A1C: ESTIMATED AVERAGE GLUCOSE: 126 mg/dL

## 2019-08-13 LAB — TACROLIMUS, TROUGH: Lab: 6.5

## 2019-08-13 MED ADMIN — lidocaine (PF) (XYLOCAINE-MPF) 20 mg/mL (2 %) injection: SUBCUTANEOUS | @ 15:00:00 | Stop: 2019-08-13

## 2019-08-13 MED ADMIN — fentaNYL (PF) (SUBLIMAZE) injection: INTRAVENOUS | @ 15:00:00 | Stop: 2019-08-13

## 2019-08-13 MED ADMIN — midazolam (VERSED) injection: INTRAVENOUS | @ 15:00:00 | Stop: 2019-08-13

## 2019-08-13 MED ADMIN — verapamiL (ISOPTIN) injection: INTRA_ARTERIAL | @ 15:00:00 | Stop: 2019-08-13

## 2019-08-13 MED ADMIN — sodium chloride (NS) 0.9 % infusion: INTRAVENOUS | @ 15:00:00 | Stop: 2019-08-13

## 2019-08-13 MED ADMIN — aspirin chewable tablet 243 mg: 243 mg | ORAL | @ 14:00:00 | Stop: 2019-08-13

## 2019-08-13 MED ADMIN — heparin (porcine) in NS 10,000 unit/1,000 mL Manifold Flush: @ 15:00:00 | Stop: 2019-08-13

## 2019-08-13 MED ADMIN — iohexoL (OMNIPAQUE) 300 mg iodine/mL solution: INTRAVENOUS | @ 15:00:00 | Stop: 2019-08-13

## 2019-08-13 MED ADMIN — heparin (porcine) 1000 unit/mL injection: INTRAVENOUS | @ 15:00:00 | Stop: 2019-08-13

## 2019-08-13 NOTE — Unmapped (Signed)
Discharge instructions provided verbally and in writing.  Questions invited and answered.   Verbalized understanding.  Verbalized readiness for discharge.  No distress noted.  No concerns voiced.  Vital signs stable.   Procedural site within normal limits.   Discharge via wheelchair to [facility] for CXR.

## 2019-08-13 NOTE — Unmapped (Signed)
CARDIAC CATHETERIZATION HISTORY AND PHYSICAL    PCP:  Shelva Majestic, MD  Phone:  [phone]  Fax:  [phone]    Referring Physicians:  Marlaine Hind, Md  [address]  [id number]  [place],  [place] [address]     Primary Cardiologist:  Cherly Hensen    HISTORY OF PRESENT ILLNESS     Andrew Dickerson is a 63 y.o. male with a history of heart transplantation in 07/2012 for ischemic cardiomyopathy, HTN, HLD, Diabetes, hypothyroidism.  He is referred for left heart catheterization today for CAV surveillance.  He denies any new symptoms including chest pain, SOB, DOE.  He denies LE edema.  He has been feeling well overall and compliant with his home medications without issues.        PAST MEDICAL HISTORY was reviewed above. Pertinent cardiac risk factors include:    hypertension  hyperlipidemia  diabetes mellitus    Previous smoker    MEDICATIONS, ALLERGIES, SOCIAL HISTORY, and FAMILY HISTORY were reviewed, pertinent findings documented above.    REVIEW OF SYSTEMS  A comprehensive 12+ system review of systems was negative than otherwise noted above.     Objective:      OBJECTIVE  BP 142/74  - Pulse 90  - Temp 36.6 ??C (Oral)  - Resp 16  - Ht 172.7 cm (5' 8)  - Wt (!) 107.6 kg (237 lb 4.8 oz)  - SpO2 100%  - BMI 36.08 kg/m??   PHYSICAL EXAMINATION:   Gen: Well appearing, NAD  CV: RRR, no murmurs  Pulm: Normal effort, CTA throughout, no wheezing  Abd: Soft, NT, ND  Ext: Warm, No edema, 2+ radial and femoral pulses  Skin: No atypical appearing moles. No rashes  Neuro: moving all extremities, no obvious focal deficits  Psych: nl mood and affect      ECG :  NSR, NSST inferior and lateral    Stress Test : 07/2018 DSE normal    No new antiarrhythmic therapy initiated prior to cath lab.    No cardiac CTA performed    Multiple LHC without intervention.  Most recently 07/2017 with no CAV    No record of EF within 6 months.    No Agatston coronary calcium score was assessed. CSHA Clinical Frailty Scale : 3 - Managing Well    Chest Pain Assessment : asymptomatic     Cardiovascular Instability : none    Recent CV pertinent labs:  Lab Results   Component Value Date    Sodium 136 08/06/2019    Sodium 138 11/29/2014    Potassium 4.8 08/06/2019    Potassium 5.0 11/29/2014    Chloride 105 08/06/2019    CO2 16.0 (L) 08/06/2019    CO2 22 11/29/2014    BUN 22 08/06/2019    Creatinine 1.07 08/06/2019    Creatinine 1.14 04/25/2019    Creatinine 1.19 11/14/2018    Creatinine 0.99 11/29/2014    Creatinine 1.0 07/24/2014    Creatinine 1.01 02/12/2014    EGFR Non-African American >=60 02/12/2014    Glucose 132 (H) 08/06/2019    Calcium 9.8 08/06/2019    Calcium 9.9 11/29/2014    Magnesium 1.3 (L) 08/06/2019    Magnesium 1.6 11/29/2014    Phosphorus 2.6 07/14/2018    Phosphorus 2.9 11/29/2014    HGB 13.7 08/06/2019    Hemoglobin, POC 12.7 (L) 08/24/2016    HCT 41.8 08/06/2019    HCT 42.0 11/29/2014    Platelet 304 08/06/2019  Platelet 247 11/29/2014    Total Protein 7.3 08/06/2019    Albumin 4.7 08/06/2019    Albumin 4.6 02/12/2014    Total Bilirubin 0.5 08/06/2019    Total Bilirubin 0.6 07/14/2018    AST 10 08/06/2019    AST 15 11/29/2014    ALT 13 08/06/2019    ALT 28 11/29/2014    Alkaline Phosphatase 67 08/06/2019    Alkaline Phosphatase 71 07/24/2014    PRO-BNP 105 01/04/2013    PRO-BNP 472 (H) 06/12/2012    PRO-BNP 530 (H) 06/09/2012    LDL Calculated 109 (H) 04/25/2019    LDL Calculated 92 07/24/2014    LDL Cholesterol, Calculated 100 01/04/2015    Non-HDL Cholesterol 134 (H) 04/25/2019    HDL 34 (L) 04/25/2019    HDL 32 01/04/2015    INR 0.97 08/25/2015    INR 1.0 07/24/2014    INR 1 07/24/2014    INR 1.1 07/27/2013    APTT 26.7 08/07/2012    Troponin I 0.113 (CH) 05/04/2009    Troponin I 0.064 (CH) 05/01/2009    Troponin I 0.777 (CH) 04/21/2009    TSH 1.400 04/25/2019    TSH 1.53 07/24/2014         ASSESSMENT AND PLAN: Andrew Dickerson is a 63 y.o. male with the above stated history who has been referred for left heart cath. The patient signed informed consent for the procedure after a discussion about the procedure, its risks, potential benefits, and alternatives. We will plan for a right radial approach.    Medications Administered : (pre-procedure)  Aspirin    Medications Contraindicated :   None documented    The patient's estimated bleeding risk is 0.8%.    Strategies used to mitigate risk include:radial approach, TR band

## 2019-08-13 NOTE — Unmapped (Signed)
Cardiac Catheterization Laboratory  [facility], [place]  Tel: [phone]     Fax: [phone]     CARDIAC CATHETERIZATION REPORT    PCP:  Shelva Majestic, MD  Phone:  [phone]  Fax:  [phone]    Referring Physicians:  Nicky Pugh MD  [facility]      Indication:  Andrew Dickerson is a 63 y.o. male with history of OHT who was referred for a left heart cath and coronary angiogram     Findings:  1. No angiographically apparent coronary artery disease  2. LVEDP 12 mm Hg    Recommendations:  ?? Aggressive secondary prevention  ____________________________________________________________________________     Performing Attending:  Elpidio Anis, MD  Diagnostic Fellow:  Alla German, MD

## 2019-08-14 NOTE — Unmapped (Signed)
Interim History:  Since his last visit which was virtual  for his annual transplant visit in 01/2019 he has been feeling well.      Review of Systems:    General: Denies fevers, chills. Reports his energy level has diminished but attributes this to not going out as much due to COVID.  ENT: Denies Hoarseness/Voice Changes, Difficulty Swallowing, Difficulty Hearing, Vision Changes and Headaches   CV: Denies Chest Pain, Palpitations, Syncope and Edema  He does  regularly check his blood pressure at home.  BP Today-  Was variable. He did take his BP medications this AM. He does check his BP every AM prior to his morning medications including his BP medications. Home BP range from 145-175/70. Will discuss with [facility] and make adjustments as necessary.   Resp: Denies Cough, SOB and DOE  GI: Denies Diarrhea, Constipation, Indisgestion, Nausea and Vomiting    GU: Denies Pain with Urination, Hesitancy with Urination, Increased Frequency with Urination and Blood in Urine. Does wake only once at night to void  MSK: Denies Aches, Weakness, Numbness and Joint Pain  Skin: Denies Rashes and Breakdown. Does have a raised, dry, scaly area on his scalp. He has not seen a Dermatologist in over a year. Recommended he make follow up appt.   Endo: Denies Increased Thirst, Increased Urination, Cold Intolerance and Heat Intolerance.    Neuro/Psych: Denies Depression, Anxiety and Sleep Disturbance     Health Maintenance:   Diabetes: A1C- 6.0%  Dental:  Edentulous   Derm: Recommended follow up  Optho: Has been seen in 2020.No changes in Rx      Meds:     Current Outpatient Medications on File Prior to Visit   Medication Sig   ??? amLODIPine (NORVASC) 10 MG tablet Take 1 tablet (10 mg total) by mouth daily.   ??? aspirin (ADULT LOW DOSE ASPIRIN) 81 MG tablet Take 1 tablet (81 mg total) by mouth daily.   ??? atorvastatin (LIPITOR) 80 MG tablet TAKE 1 TABLET (80 MG TOTAL) BY MOUTH DAILY. ??? blood sugar diagnostic Strp CHECK BLOOD SUGAR BEFORE MEALS, AT BEDTIME, & AS NEEDED FOR HYPERGLYCEMIA UP TO 6 TIMES A DAY   ??? chlorthalidone (HYGROTON) 25 MG tablet Take 1 tablet (25 mg total) by mouth daily.   ??? cholecalciferol, vitamin D3, 1,000 unit capsule Take 3,000 Units by mouth daily.   ??? diltiazem (CARDIZEM CD) 180 MG 24 hr capsule Take 2 capsules (360 mg total) by mouth daily.   ??? famotidine (PEPCID) 20 MG tablet Take 1 tablet (20 mg total) by mouth Two (2) times a day.   ??? levothyroxine (SYNTHROID) 75 MCG tablet Take 1 tablet (75 mcg total) by mouth daily.   ??? lisinopriL (PRINIVIL,ZESTRIL) 40 MG tablet Take 1 tablet (40 mg total) by mouth daily.   ??? metFORMIN (GLUCOPHAGE) 1000 MG tablet Take 1,000 mg by mouth Two (2) times a day.   ??? mycophenolate (CELLCEPT) 500 mg tablet Take 1 tablet (500 mg total) by mouth Two (2) times a day.   ??? tacrolimus (PROGRAF) 0.5 MG capsule Take 2 capsules (1 mg total) by mouth daily AND 1 capsule (0.5 mg total) nightly.     Current Facility-Administered Medications on File Prior to Visit   Medication   ??? [COMPLETED] aspirin chewable tablet 243 mg   ??? magnesium oxide (MAG-OX) tablet 800 mg   ??? [COMPLETED] sodium chloride (NS) 0.9 % infusion          Labs:   Admission on  08/13/2019, Discharged on 08/13/2019   Component Date Value Ref Range Status   ??? EKG Ventricular Rate 08/13/2019 88  BPM Incomplete   ??? EKG Atrial Rate 08/13/2019 88  BPM Incomplete   ??? EKG P-R Interval 08/13/2019 140  ms Incomplete   ??? EKG QRS Duration 08/13/2019 84  ms Incomplete   ??? EKG Q-T Interval 08/13/2019 336  ms Incomplete   ??? EKG QTC Calculation 08/13/2019 406  ms Incomplete   ??? EKG Calculated P Axis 08/13/2019 21  degrees Incomplete   ??? EKG Calculated R Axis 08/13/2019 6  degrees Incomplete   ??? EKG Calculated T Axis 08/13/2019 56  degrees Incomplete   ??? QTC Fredericia 08/13/2019 381  ms Incomplete   ??? Triglycerides 08/13/2019 111  1 - 149 mg/dL Final ??? Cholesterol 16/06/9603 150  100 - 199 mg/dL Final   ??? HDL 54/05/8118 28* 40 - 59 mg/dL Final   ??? LDL Calculated 08/13/2019 147* 60 - 99 mg/dL Final   ??? VLDL Cholesterol Cal 08/13/2019 22.2  12 - 42 mg/dL Final   ??? Chol/HDL Ratio 08/13/2019 5.4* <8.2 Final   ??? Non-HDL Cholesterol 08/13/2019 122  mg/dL Final   ??? FASTING 95/62/1308 No   Final   ??? Sodium 08/13/2019 136  135 - 145 mmol/L Final   ??? Potassium 08/13/2019 4.5  3.5 - 5.0 mmol/L Final   ??? Chloride 08/13/2019 106  98 - 107 mmol/L Final   ??? CO2 08/13/2019 20.0* 22.0 - 30.0 mmol/L Final   ??? Anion Gap 08/13/2019 10  7 - 15 mmol/L Final   ??? BUN 08/13/2019 29* 7 - 21 mg/dL Final   ??? Creatinine 08/13/2019 1.28  0.70 - 1.30 mg/dL Final   ??? BUN/Creatinine Ratio 08/13/2019 23   Final   ??? EGFR CKD-EPI Non-African American,* 08/13/2019 60  >=60 mL/min/1.25m2 Final   ??? EGFR CKD-EPI African American, Male 08/13/2019 69  >=60 mL/min/1.73m2 Final   ??? Glucose 08/13/2019 190* 70 - 179 mg/dL Final   ??? Calcium 65/78/4696 9.6  8.5 - 10.2 mg/dL Final   ??? Tacrolimus, Trough 08/13/2019 6.5  5.0 - 15.0 ng/mL Final   ??? Hemoglobin A1C 08/13/2019 6.0* 4.8 - 5.6 % Final   ??? Estimated Average Glucose 08/13/2019 126  mg/dL Final   ??? TSH 29/52/8413 1.115  0.600 - 3.300 uIU/mL Final   Abstract on 08/09/2019   Component Date Value Ref Range Status   ??? Albumin 08/06/2019 4.7  g/dL Final   ??? WBC 24/40/1027 8.7  10*9/L Final   ??? RBC 08/06/2019 4.75  10*12/L Final   ??? HGB 08/06/2019 13.7  g/dL Final   ??? HCT 25/36/6440 41.8  % Final   ??? MCV 08/06/2019 88.0  fL Final   ??? MCH 08/06/2019 28.8  pg Final   ??? MCHC 08/06/2019 32.8  g/dL Final   ??? RDW 34/74/2595 13.1  % Final   ??? MPV 08/06/2019 10.6  fL Final   ??? Platelet 08/06/2019 304  10*9/L Final   ??? Neutrophils % 08/06/2019 74.6  % Final   ??? Lymphocytes % 08/06/2019 15.1  % Final   ??? Monocytes % 08/06/2019 6.6  % Final   ??? Eosinophils % 08/06/2019 2.9  % Final   ??? Basophils % 08/06/2019 0.8  % Final ??? Absolute Neutrophils 08/06/2019 6.5  10*9/L Final   ??? Absolute Lymphocytes 08/06/2019 1.3  10*9/L Final   ??? Absolute Monocytes 08/06/2019 0.6  10*9/L Final   ??? Absolute Eosinophils 08/06/2019 0.3  10*9/L Final   ??? Absolute Basophils 08/06/2019 0.1  10*9/L Final   ??? Sodium 08/06/2019 136  mmol/L Final   ??? Potassium 08/06/2019 4.8  mmol/L Final   ??? Chloride 08/06/2019 105  mmol/L Final   ??? CO2 08/06/2019 16.0* mmol/L Final   ??? BUN 08/06/2019 22  mg/dL Final   ??? Creatinine 08/06/2019 1.07  mg/dL Final   ??? Glucose 96/12/5407 132* mg/dL Final   ??? Calcium 81/19/1478 9.8  mg/dL Final   ??? Total Protein 08/06/2019 7.3  g/dL Final   ??? Total Bilirubin 08/06/2019 0.5  mg/dL Final   ??? AST 29/56/2130 10  U/L Final   ??? ALT 08/06/2019 13  U/L Final   ??? Alkaline Phosphatase 08/06/2019 67  U/L Final   ??? EGFR CKD-EPI Non-African American,* 08/06/2019 74  mL/min/1.12m2 Final   ??? EGFR CKD-EPI African American, Male 08/06/2019 86  mL/min/1.3m2 Final   ??? Tacrolimus, Trough 08/06/2019 4.2* ng/mL Final   ??? Magnesium 08/06/2019 1.3* mg/dL Final             Plan:  Will contact patient in next several days with remainder of results and any necessary changes.    Labs in 3 Months  Return to clinic 6 months, sooner PRN. Since patient's last visit was virtual he was not able to have an echocardiogram, Was not able to schedule one today. Will discuss with Dr Cherly Hensen.       Duaine Dredge, RN   Heart Transplant Coordinator      I spent 20 minutes with patient discussing medications, symptoms, questions & providing education.

## 2019-08-16 LAB — HLA DS POST TRANSPLANT
ANTI-DONOR DRW #1 MFI: 245 MFI
ANTI-DONOR DRW #2 MFI: 554 MFI
ANTI-DONOR HLA-A #1 MFI: 14 MFI
ANTI-DONOR HLA-B #1 MFI: 17 MFI
ANTI-DONOR HLA-B #2 MFI: 72 MFI
ANTI-DONOR HLA-C #2 MFI: 0 MFI
ANTI-DONOR HLA-DQB #1 MFI: 273 MFI
ANTI-DONOR HLA-DQB #2 MFI: 126 MFI
ANTI-DONOR HLA-DR #1 MFI: 152 MFI
ANTI-DONOR HLA-DR #2 MFI: 123 MFI

## 2019-08-16 LAB — HLA CL1 ANTIBODY COMM: Lab: 0

## 2019-08-16 LAB — ANTI-DONOR HLA-C #1 MFI: Lab: 0

## 2019-08-16 LAB — HLA CL2 AB RESULT: Lab: POSITIVE

## 2019-08-28 DIAGNOSIS — L2084 Intrinsic (allergic) eczema: Principal | ICD-10-CM

## 2019-08-28 MED ORDER — VALSARTAN 160 MG TABLET
ORAL_TABLET | Freq: Every day | ORAL | 3 refills | 90 days | Status: CP
Start: 2019-08-28 — End: 2020-08-27
  Filled 2019-09-03: qty 90, 90d supply, fill #0

## 2019-08-28 NOTE — Unmapped (Addendum)
OPENED IN ERROR

## 2019-08-28 NOTE — Unmapped (Signed)
Reviewed with Dr. 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  ??The lungs are well-inflated without acute airspace consolidation. No sizable pleural effusion or pneumothorax.  ??Multiple left-sided rib deformities.    DSA: Negative      Recent Labs:   Admission on 08/13/2019, Discharged on 08/13/2019   Component Date Value Ref Range Status   ??? HLA Class 1 Antibody Result 08/13/2019 Negative   Final   ??? HLA Class 1 Antibody Comment 08/13/2019    Final   ??? HLA Class 2 Antibody Result 08/13/2019 Positive   Final   ??? HLA Class 2 Antibodies Identified 08/13/2019 DR:12, 15  DPB1:1, 2, 5, 10, 15, 18   Final   ??? HLA Class 2 Antibody Comment 08/13/2019    Final   ??? Donor ID 08/13/2019 [id number]   Final   ??? Donor HLA-A Antigen #1 08/13/2019 A30   Final   ??? Anti-Donor HLA-A #1 MFI 08/13/2019 14  <1000 MFI Final   ??? Donor HLA-A Antigen #2 08/13/2019 A74   Final   ??? Anti-Donor HLA-A #2 MFI 08/13/2019 27  <1000 MFI Final   ??? Donor HLA-B Antigen #1 08/13/2019 B42   Final   ??? Anti-Donor HLA-B #1 MFI 08/13/2019 17  <1000 MFI Final   ??? Donor HLA-B Antigen #2 08/13/2019 B57   Final   ??? Anti-Donor HLA-B #2 MFI 08/13/2019 72  <1000 MFI Final   ??? Donor HLA-C Antigen #1 08/13/2019 C17   Final   ??? Anti-Donor HLA-C #1 MFI 08/13/2019 0  <1000 MFI Final   ??? Donor HLA-C Antigen #2 08/13/2019 C18   Final   ??? Anti-Donor HLA-C #2 MFI 08/13/2019 0  <1000 MFI Final   ??? Donor HLA-DR Antigen #1 08/13/2019 DR18   Final   ??? Anti-Donor HLA-DR #1 MFI 08/13/2019 152  <1000 MFI Final   ??? Donor HLA-DR Antigen #2 08/13/2019 DR16   Final   ??? Anti-Donor HLA-DR #2 MFI 08/13/2019 123  <1000 MFI Final   ??? Donor DRw Antigen #1 08/13/2019 DR52   Final   ??? Anti-Donor DRw #1 MFI 08/13/2019 245  <1000 MFI Final   ??? Donor DRw Antigen #2 08/13/2019 DR51   Final ??? Anti-Donor DRw #2 MFI 08/13/2019 554  <1000 MFI Final   ??? Donor HLA-DQB Antigen #1 08/13/2019 DQ4   Final   ??? Anti-Donor HLA-DQB #1 MFI 08/13/2019 273  <1000 MFI Final   ??? Donor HLA-DQB Antigen #2 08/13/2019 DQ5   Final   ??? Anti-Donor HLA-DQB #2 MFI 08/13/2019 126  <1000 MFI Final   ??? DSA Comment 08/13/2019    Final   ??? EKG Ventricular Rate 08/13/2019 88  BPM Final   ??? EKG Atrial Rate 08/13/2019 88  BPM Final   ??? EKG P-R Interval 08/13/2019 140  ms Final   ??? EKG QRS Duration 08/13/2019 84  ms Final   ??? EKG Q-T Interval 08/13/2019 336  ms Final   ??? EKG QTC Calculation 08/13/2019 406  ms Final   ??? EKG Calculated P Axis 08/13/2019 21  degrees Final   ??? EKG Calculated R Axis 08/13/2019 6  degrees Final   ??? EKG Calculated T Axis 08/13/2019 56  degrees Final   ??? QTC Fredericia 08/13/2019 381  ms Final   ??? Triglycerides 08/13/2019 111  1 - 149 mg/dL Final   ??? Cholesterol 08/13/2019 150  100 - 199 mg/dL Final   ??? HDL 16/06/9603 28* 40 - 59 mg/dL Final   ??? LDL Calculated 08/13/2019 540* 60 - 99 mg/dL Final   ??? VLDL Cholesterol Cal 08/13/2019 22.2  12 - 42 mg/dL Final   ??? Chol/HDL Ratio 08/13/2019 5.4* <9.8 Final   ??? Non-HDL Cholesterol 08/13/2019 122  mg/dL Final   ??? FASTING 11/91/4782 No   Final   ??? Sodium 08/13/2019 136  135 - 145 mmol/L Final   ??? Potassium 08/13/2019 4.5  3.5 - 5.0 mmol/L Final   ??? Chloride 08/13/2019 106  98 - 107 mmol/L Final   ??? CO2 08/13/2019 20.0* 22.0 - 30.0 mmol/L Final   ??? Anion Gap 08/13/2019 10  7 - 15 mmol/L Final   ??? BUN 08/13/2019 29* 7 - 21 mg/dL Final   ??? Creatinine 08/13/2019 1.28  0.70 - 1.30 mg/dL Final   ??? BUN/Creatinine Ratio 08/13/2019 23   Final   ??? EGFR CKD-EPI Non-African American,* 08/13/2019 60  >=60 mL/min/1.36m2 Final   ??? EGFR CKD-EPI African American, Male 08/13/2019 69  >=60 mL/min/1.65m2 Final   ??? Glucose 08/13/2019 190* 70 - 179 mg/dL Final   ??? Calcium 95/62/1308 9.6  8.5 - 10.2 mg/dL Final   ??? Tacrolimus, Trough 08/13/2019 6.5  5.0 - 15.0 ng/mL Final ??? Hemoglobin A1C 08/13/2019 6.0* 4.8 - 5.6 % Final   ??? Estimated Average Glucose 08/13/2019 126  mg/dL Final   ??? TSH 65/78/4696 1.115  0.600 - 3.300 uIU/mL Final   Abstract on 08/09/2019   Component Date Value Ref Range Status   ??? Albumin 08/06/2019 4.7  g/dL Final   ??? WBC 29/52/8413 8.7  10*9/L Final   ??? RBC 08/06/2019 4.75  10*12/L Final   ??? HGB 08/06/2019 13.7  g/dL Final   ??? HCT 24/40/1027 41.8  % Final   ??? MCV 08/06/2019 88.0  fL Final   ??? MCH 08/06/2019 28.8  pg Final   ??? MCHC 08/06/2019 32.8  g/dL Final   ??? RDW 25/36/6440 13.1  % Final   ??? MPV 08/06/2019 10.6  fL Final   ??? Platelet 08/06/2019 304  10*9/L Final   ??? Neutrophils % 08/06/2019 74.6  % Final   ??? Lymphocytes % 08/06/2019 15.1  % Final   ??? Monocytes % 08/06/2019 6.6  % Final   ??? Eosinophils % 08/06/2019 2.9  % Final   ??? Basophils % 08/06/2019 0.8  % Final   ??? Absolute Neutrophils 08/06/2019 6.5  10*9/L Final   ??? Absolute Lymphocytes 08/06/2019 1.3  10*9/L Final   ??? Absolute Monocytes 08/06/2019 0.6  10*9/L Final   ??? Absolute Eosinophils 08/06/2019 0.3  10*9/L Final   ??? Absolute Basophils 08/06/2019 0.1  10*9/L Final   ??? Sodium 08/06/2019 136  mmol/L Final   ??? Potassium 08/06/2019 4.8  mmol/L Final   ??? Chloride 08/06/2019 105  mmol/L Final   ??? CO2 08/06/2019 16.0* mmol/L Final   ??? BUN 08/06/2019 22  mg/dL Final   ??? Creatinine 08/06/2019 1.07  mg/dL Final   ??? Glucose 34/74/2595 132* mg/dL Final   ??? Calcium 63/87/5643 9.8  mg/dL Final   ??? Total Protein 08/06/2019 7.3  g/dL Final   ??? Total Bilirubin 08/06/2019 0.5  mg/dL Final   ??? AST 32/95/1884 10  U/L Final   ??? ALT 08/06/2019 13  U/L Final   ??? Alkaline Phosphatase 08/06/2019 67  U/L  Final   ??? EGFR CKD-EPI Non-African American,* 08/06/2019 74  mL/min/1.73m2 Final   ??? EGFR CKD-EPI African American, Male 08/06/2019 86  mL/min/1.22m2 Final   ??? Tacrolimus, Trough 08/06/2019 4.2* ng/mL Final   ??? Magnesium 08/06/2019 1.3* mg/dL Final         Immunosuppression:   Tac: 1 mg/0.5 mg and MMF: 500 mg BID  Tac: 5-8 Changes: Patient reguarly checks his BP at home. His BP in the AM prior to taking his morning medications is consistenly in 150-170 systolic. Will STOP Lisinopril and start Valsartan 160 mg   Next Labs: 2 Weeks after starting Valsartan and will review BP at this time. If BP not improved will increase Valsartan.  RTC: 01/2020 sooner PRN

## 2019-08-29 NOTE — Unmapped (Addendum)
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     Was a Nurse, learning disability used for this call? No    Completed refill call assessment today to schedule patient's medication shipment from the [facility] [phone]).       Specialty medication(s) and dose(s) confirmed: Regimen is correct and unchanged.   Changes to medications: Andrew Dickerson reports no changes at this time.  Changes to insurance: No  Questions for the pharmacist: No    Confirmed patient received Welcome Packet with first shipment. The patient will receive a drug information handout for each medication shipped and additional FDA Medication Guides as required.       DISEASE/MEDICATION-SPECIFIC INFORMATION        N/A    SPECIALTY MEDICATION ADHERENCE     Medication Adherence    Patient reported X missed doses in the last month: 0  Specialty Medication: Mycophenolate 250mg   Patient is on additional specialty medications: Yes  Additional Specialty Medications: Tacrolimus 0.5mg   Patient Reported Additional Medication X Missed Doses in the Last Month: 0  Patient is on more than two specialty medications: No          Mycophenolate 250 mg: 7 days of medicine on hand   Tacrolimus 0.5 mg: 7 days of medicine on hand     SHIPPING     Shipping address confirmed in Epic.     Delivery Scheduled: Yes, Expected medication delivery date: 09/04/2019.  However, Rx request for refills was sent to the provider as there are none remaining. Medication will be delivered via UPS to the prescription address in Epic WAM.    Andrew Dickerson [facility] Specialty Technician

## 2019-09-03 DIAGNOSIS — Z941 Heart transplant status: Principal | ICD-10-CM

## 2019-09-03 MED ORDER — ASPIRIN 81 MG TABLET,DELAYED RELEASE
ORAL_TABLET | Freq: Every day | ORAL | 3 refills | 90 days | Status: CP
Start: 2019-09-03 — End: ?
  Filled 2019-09-03: qty 90, 90d supply, fill #0

## 2019-09-03 MED ORDER — MYCOPHENOLATE MOFETIL 500 MG TABLET
ORAL_TABLET | Freq: Two times a day (BID) | ORAL | 3 refills | 90.00000 days | Status: CP
Start: 2019-09-03 — End: 2020-09-02
  Filled 2019-09-03: qty 60, 30d supply, fill #0

## 2019-09-03 MED ORDER — FAMOTIDINE 20 MG TABLET
ORAL_TABLET | Freq: Two times a day (BID) | ORAL | 3 refills | 90 days | Status: CP
Start: 2019-09-03 — End: 2020-09-02
  Filled 2019-09-03: qty 180, 90d supply, fill #0

## 2019-09-03 MED ORDER — AMLODIPINE 10 MG TABLET
ORAL_TABLET | Freq: Every day | ORAL | 3 refills | 90 days | Status: CP
Start: 2019-09-03 — End: 2020-09-02
  Filled 2019-09-03: qty 90, 90d supply, fill #0

## 2019-09-03 MED FILL — TACROLIMUS 0.5 MG CAPSULE: 30 days supply | Qty: 90 | Fill #5 | Status: AC

## 2019-09-03 MED FILL — ATORVASTATIN 80 MG TABLET: 30 days supply | Qty: 30 | Fill #8 | Status: AC

## 2019-09-03 MED FILL — DILTIAZEM CD 180 MG CAPSULE,EXTENDED RELEASE 24 HR: ORAL | 30 days supply | Qty: 60 | Fill #1

## 2019-09-03 MED FILL — LEVOTHYROXINE 75 MCG TABLET: ORAL | 30 days supply | Qty: 30 | Fill #1

## 2019-09-03 MED FILL — CHLORTHALIDONE 25 MG TABLET: 30 days supply | Qty: 30 | Fill #1 | Status: AC

## 2019-09-03 MED FILL — DILTIAZEM CD 180 MG CAPSULE,EXTENDED RELEASE 24 HR: 30 days supply | Qty: 60 | Fill #1 | Status: AC

## 2019-09-03 MED FILL — AMLODIPINE 10 MG TABLET: 90 days supply | Qty: 90 | Fill #0 | Status: AC

## 2019-09-03 MED FILL — ASPIRIN 81 MG TABLET,DELAYED RELEASE: 90 days supply | Qty: 90 | Fill #0 | Status: AC

## 2019-09-03 MED FILL — VALSARTAN 160 MG TABLET: 90 days supply | Qty: 90 | Fill #0 | Status: AC

## 2019-09-03 MED FILL — TACROLIMUS 0.5 MG CAPSULE, IMMEDIATE-RELEASE: ORAL | 30 days supply | Qty: 90 | Fill #5

## 2019-09-03 MED FILL — FAMOTIDINE 20 MG TABLET: 90 days supply | Qty: 180 | Fill #0 | Status: AC

## 2019-09-03 MED FILL — MYCOPHENOLATE MOFETIL 500 MG TABLET: 30 days supply | Qty: 60 | Fill #0 | Status: AC

## 2019-09-03 MED FILL — ATORVASTATIN 80 MG TABLET: 30 days supply | Qty: 30 | Fill #8

## 2019-09-03 MED FILL — LEVOTHYROXINE 75 MCG TABLET: 30 days supply | Qty: 30 | Fill #1 | Status: AC

## 2019-09-03 MED FILL — CHLORTHALIDONE 25 MG TABLET: ORAL | 30 days supply | Qty: 30 | Fill #1

## 2019-09-03 NOTE — Unmapped (Signed)
Change in Mycophenolate dosage, refill request was for 250 mg tablets but 500 mg tablets were sent (scheudled to fill today). Co-pay $17.38 for 90 day.

## 2019-09-21 MED ORDER — VALSARTAN 160 MG TABLET
ORAL_TABLET | Freq: Every day | ORAL | 3 refills | 90.00000 days | Status: CP
Start: 2019-09-21 — End: 2020-09-20
  Filled 2019-10-31: qty 180, 90d supply, fill #0

## 2019-09-21 NOTE — Unmapped (Signed)
Patient recently transitioned from Lisinopril to Valsartan. He sent me his most recent BP. See below          He verified he has been taking his Valsartan 160 mg daily. Discussed with Chrissy Doligalski, will increase to 320 mg. Will follow up on his BP in 2 weeks. Have sent patient a lab slip and requested he have labs when he receives lab slip.Andrew Dickerson verbalized understanding & agreed with the plan.

## 2019-09-24 DIAGNOSIS — Z941 Heart transplant status: Secondary | ICD-10-CM | POA: Diagnosis not present

## 2019-09-24 DIAGNOSIS — R7989 Other specified abnormal findings of blood chemistry: Secondary | ICD-10-CM | POA: Diagnosis not present

## 2019-09-24 DIAGNOSIS — Z79899 Other long term (current) drug therapy: Secondary | ICD-10-CM | POA: Diagnosis not present

## 2019-09-27 LAB — CBC W/ DIFFERENTIAL
BASOPHILS ABSOLUTE COUNT: 0.3 10*9/L
BASOPHILS RELATIVE PERCENT: 4.4 %
EOSINOPHILS ABSOLUTE COUNT: 0.7 10*9/L
EOSINOPHILS RELATIVE PERCENT: 10.6 %
HEMATOCRIT: 36.7 % — AB (ref 38.0–50.0)
HEMOGLOBIN: 12.1 g/dL — AB (ref 13.2–17.1)
LYMPHOCYTES ABSOLUTE COUNT: 4.5 10*9/L
LYMPHOCYTES RELATIVE PERCENT: 64.8 %
MEAN CORPUSCULAR HEMOGLOBIN CONC: 33 g/dL
MEAN CORPUSCULAR HEMOGLOBIN: 29.3 pg
MEAN CORPUSCULAR VOLUME: 88.9 fL
MONOCYTES ABSOLUTE COUNT: 1.3 10*9/L
NEUTROPHILS ABSOLUTE COUNT: 1.1 10*9/L
NEUTROPHILS RELATIVE PERCENT: 77 %
PLATELET COUNT: 229 10*9/L
RED BLOOD CELL COUNT: 4.13 10*12/L — AB (ref 4.20–5.80)
RED CELL DISTRIBUTION WIDTH: 13.5 %
WHITE BLOOD CELL COUNT: 7 10*9/L

## 2019-09-27 LAB — BASIC METABOLIC PANEL
BLOOD UREA NITROGEN: 18 mg/dL
CHLORIDE: 105 mmol/L
CO2: 21 mmol/L
CREATININE: 1.18 mg/dL
POTASSIUM: 4.5 mmol/L
SODIUM: 136 mmol/L

## 2019-09-27 LAB — TACROLIMUS, TROUGH: Lab: 4.5 — AB

## 2019-09-27 LAB — MAGNESIUM: Lab: 1.2 — AB

## 2019-09-27 LAB — EGFR CKD-EPI AA FEMALE: Lab: 0

## 2019-09-27 LAB — LYMPHOCYTES ABSOLUTE COUNT: Lab: 4.5

## 2019-09-28 MED ORDER — MAGNESIUM OXIDE 400 MG (241.3 MG MAGNESIUM) TABLET
ORAL_TABLET | Freq: Two times a day (BID) | ORAL | 11 refills | 30 days
Start: 2019-09-28 — End: 2020-09-27

## 2019-09-28 NOTE — Unmapped (Signed)
Discussed recent labs with Andrew Dickerson, PharmD.  Plan is to Start MG 800 mg BID   with repeat labs in 3 Months.    Discussed patients recent BP since increasing Valsartan- he has only been on increased dose for a couple of days. Will follow up with him again next week regarding BP. Recommended he have BP cuff calibrated at local pharmacy.   154/73- today   136/70- yesterday     Patient also has Mg at home, advised him to start taking 800 mg BID and monitor for diarrhea.    Andrew Dickerson verbalized understanding & agreed with the plan.          Lab Results   Component Value Date    TACROLIMUS 4.5 (A) 09/21/2019     Goal: Tac: 5-8  Current Dose: Tacrolimus 1 mg /0.5 mg     Lab Results   Component Value Date    BUN 18 09/24/2019    CREATININE 1.18 09/24/2019    K 4.5 09/24/2019    GLU 134 (A) 09/24/2019    MG 1.2 (A) 09/21/2019     Lab Results   Component Value Date    WBC 7.0 09/21/2019    HGB 12.1 (A) 09/21/2019    HCT 36.7 (A) 09/21/2019    PLT 229 09/21/2019    NEUTROABS 1.1 09/21/2019    EOSABS 0.7 09/21/2019

## 2019-09-28 NOTE — Unmapped (Signed)
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     Was a Nurse, learning disability used for this call? No    Completed refill call assessment today to schedule patient's medication shipment from the [facility] [phone]).       Specialty medication(s) and dose(s) confirmed: Regimen is correct and unchanged.   Changes to medications: Upton reports no changes at this time.  Changes to insurance: No  Questions for the pharmacist: No    Confirmed patient received Welcome Packet with first shipment. The patient will receive a drug information handout for each medication shipped and additional FDA Medication Guides as required.       DISEASE/MEDICATION-SPECIFIC INFORMATION        N/A    SPECIALTY MEDICATION ADHERENCE     Medication Adherence    Patient reported X missed doses in the last month: 0  Specialty Medication: Tacrolimus 0.5mg   Patient is on additional specialty medications: Yes  Additional Specialty Medications: Mycophenolate 180mg   Patient Reported Additional Medication X Missed Doses in the Last Month: 0  Patient is on more than two specialty medications: No          Tacrolimus 0.5 mg: 7 days of medicine on hand   Mycophenolate 180 mg: 7 days of medicine on hand     SHIPPING     Shipping address confirmed in Epic.     Delivery Scheduled: Yes, Expected medication delivery date: 10/03/2019.     Medication will be delivered via UPS to the prescription address in Epic WAM.    [name] [facility] Specialty Technician

## 2019-10-02 MED FILL — LEVOTHYROXINE 75 MCG TABLET: 30 days supply | Qty: 30 | Fill #2 | Status: AC

## 2019-10-02 MED FILL — CHLORTHALIDONE 25 MG TABLET: ORAL | 30 days supply | Qty: 30 | Fill #2

## 2019-10-02 MED FILL — TACROLIMUS 0.5 MG CAPSULE, IMMEDIATE-RELEASE: ORAL | 30 days supply | Qty: 90 | Fill #6

## 2019-10-02 MED FILL — ATORVASTATIN 80 MG TABLET: 30 days supply | Qty: 30 | Fill #9

## 2019-10-02 MED FILL — LEVOTHYROXINE 75 MCG TABLET: ORAL | 30 days supply | Qty: 30 | Fill #2

## 2019-10-02 MED FILL — DILTIAZEM CD 180 MG CAPSULE,EXTENDED RELEASE 24 HR: ORAL | 30 days supply | Qty: 60 | Fill #2

## 2019-10-02 MED FILL — CHLORTHALIDONE 25 MG TABLET: 30 days supply | Qty: 30 | Fill #2 | Status: AC

## 2019-10-02 MED FILL — ATORVASTATIN 80 MG TABLET: 30 days supply | Qty: 30 | Fill #9 | Status: AC

## 2019-10-02 MED FILL — DILTIAZEM CD 180 MG CAPSULE,EXTENDED RELEASE 24 HR: 30 days supply | Qty: 60 | Fill #2 | Status: AC

## 2019-10-02 MED FILL — TACROLIMUS 0.5 MG CAPSULE: 30 days supply | Qty: 90 | Fill #6 | Status: AC

## 2019-10-02 MED FILL — MYCOPHENOLATE MOFETIL 500 MG TABLET: 30 days supply | Qty: 60 | Fill #1 | Status: AC

## 2019-10-02 MED FILL — MYCOPHENOLATE MOFETIL 500 MG TABLET: ORAL | 30 days supply | Qty: 60 | Fill #1

## 2019-10-08 ENCOUNTER — Other Ambulatory Visit: Payer: Self-pay | Admitting: *Deleted

## 2019-10-08 DIAGNOSIS — Z20822 Contact with and (suspected) exposure to covid-19: Secondary | ICD-10-CM | POA: Diagnosis not present

## 2019-10-09 LAB — NOVEL CORONAVIRUS, NAA: SARS-CoV-2, NAA: NOT DETECTED

## 2019-10-14 ENCOUNTER — Other Ambulatory Visit: Payer: Self-pay | Admitting: Family Medicine

## 2019-10-14 DIAGNOSIS — R7309 Other abnormal glucose: Secondary | ICD-10-CM

## 2019-10-29 NOTE — Unmapped (Signed)
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     Was a Nurse, learning disability used for this call? No    Completed refill call assessment today to schedule patient's medication shipment from the [facility] [phone]).       Specialty medication(s) and dose(s) confirmed: Regimen is correct and unchanged.   Changes to medications: Varick reports no changes at this time.  Changes to insurance: No  Questions for the pharmacist: No    Confirmed patient received Welcome Packet with first shipment. The patient will receive a drug information handout for each medication shipped and additional FDA Medication Guides as required.       DISEASE/MEDICATION-SPECIFIC INFORMATION        N/A    SPECIALTY MEDICATION ADHERENCE     Medication Adherence    Patient reported X missed doses in the last month: 0      mycophenolate mofetil 500mg : 7 days worth of medication on hand.  tacrolimus 0.5mg : 7 days worth of medication on hand.        SHIPPING     Shipping address confirmed in Epic.     Delivery Scheduled: Yes, Expected medication delivery date: 11/01/19.     Medication will be delivered via UPS to the prescription address in Epic WAM.    [place] A Doyne Ellinger   [facility] Shared [facility] Specialty Technician

## 2019-10-30 ENCOUNTER — Telehealth: Payer: Self-pay | Admitting: Family Medicine

## 2019-10-30 NOTE — Telephone Encounter (Signed)
I called the patient to schedule Medicare AWV with Loma Sousa ([place]).  He said that he's out and about right now and will call back to schedule it.  If patient calls back, please schedule Medicare Wellness Visit at next available opening. Last AWV 12/20/2017 VDM ([place]ee[place]Dee)

## 2019-10-31 MED FILL — DILTIAZEM CD 180 MG CAPSULE,EXTENDED RELEASE 24 HR: 30 days supply | Qty: 60 | Fill #3 | Status: AC

## 2019-10-31 MED FILL — ATORVASTATIN 80 MG TABLET: 30 days supply | Qty: 30 | Fill #10 | Status: AC

## 2019-10-31 MED FILL — TACROLIMUS 0.5 MG CAPSULE, IMMEDIATE-RELEASE: ORAL | 30 days supply | Qty: 90 | Fill #7

## 2019-10-31 MED FILL — CHLORTHALIDONE 25 MG TABLET: ORAL | 30 days supply | Qty: 30 | Fill #3

## 2019-10-31 MED FILL — MYCOPHENOLATE MOFETIL 500 MG TABLET: ORAL | 30 days supply | Qty: 60 | Fill #2

## 2019-10-31 MED FILL — MYCOPHENOLATE MOFETIL 500 MG TABLET: 30 days supply | Qty: 60 | Fill #2 | Status: AC

## 2019-10-31 MED FILL — DILTIAZEM CD 180 MG CAPSULE,EXTENDED RELEASE 24 HR: ORAL | 30 days supply | Qty: 60 | Fill #3

## 2019-10-31 MED FILL — LEVOTHYROXINE 75 MCG TABLET: ORAL | 30 days supply | Qty: 30 | Fill #3

## 2019-10-31 MED FILL — VALSARTAN 160 MG TABLET: 90 days supply | Qty: 180 | Fill #0 | Status: AC

## 2019-10-31 MED FILL — CHLORTHALIDONE 25 MG TABLET: 30 days supply | Qty: 30 | Fill #3 | Status: AC

## 2019-10-31 MED FILL — TACROLIMUS 0.5 MG CAPSULE: 30 days supply | Qty: 90 | Fill #7 | Status: AC

## 2019-10-31 MED FILL — LEVOTHYROXINE 75 MCG TABLET: 30 days supply | Qty: 30 | Fill #3 | Status: AC

## 2019-10-31 MED FILL — ATORVASTATIN 80 MG TABLET: 30 days supply | Qty: 30 | Fill #10

## 2019-11-27 NOTE — Unmapped (Signed)
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     Was a Nurse, learning disability used for this call? No    Completed refill call assessment today to schedule patient's medication shipment from the [facility] [phone]).       Specialty medication(s) and dose(s) confirmed: Regimen is correct and unchanged.   Changes to medications: Sylar reports no changes at this time.  Changes to insurance: No  Questions for the pharmacist: No    Confirmed patient received Welcome Packet with first shipment. The patient will receive a drug information handout for each medication shipped and additional FDA Medication Guides as required.       DISEASE/MEDICATION-SPECIFIC INFORMATION        N/A    SPECIALTY MEDICATION ADHERENCE     Medication Adherence    Patient reported X missed doses in the last month: 0  Specialty Medication: Mycophenolate 500mg   Patient is on additional specialty medications: Yes  Additional Specialty Medications: Tacrolimus 0.5mg   Patient Reported Additional Medication X Missed Doses in the Last Month: 0  Patient is on more than two specialty medications: No          Mycophenolate 500 mg: 4 days of medicine on hand   Tacrolimus 0.5 mg: 4 days of medicine on hand     SHIPPING     Shipping address confirmed in Epic.     Delivery Scheduled: Yes, Expected medication delivery date: 11/30/2019.     Medication will be delivered via UPS to the prescription address in Epic WAM.    [name] [facility] Specialty Technician

## 2019-11-29 MED FILL — AMLODIPINE 10 MG TABLET: 90 days supply | Qty: 90 | Fill #1 | Status: AC

## 2019-11-29 MED FILL — ATORVASTATIN 80 MG TABLET: 30 days supply | Qty: 30 | Fill #11 | Status: AC

## 2019-11-29 MED FILL — AMLODIPINE 10 MG TABLET: ORAL | 90 days supply | Qty: 90 | Fill #1

## 2019-11-29 MED FILL — DILTIAZEM CD 180 MG CAPSULE,EXTENDED RELEASE 24 HR: ORAL | 30 days supply | Qty: 60 | Fill #4

## 2019-11-29 MED FILL — CHLORTHALIDONE 25 MG TABLET: 30 days supply | Qty: 30 | Fill #4 | Status: AC

## 2019-11-29 MED FILL — FAMOTIDINE 20 MG TABLET: ORAL | 90 days supply | Qty: 180 | Fill #1

## 2019-11-29 MED FILL — FAMOTIDINE 20 MG TABLET: 90 days supply | Qty: 180 | Fill #1 | Status: AC

## 2019-11-29 MED FILL — ASPIRIN 81 MG TABLET,DELAYED RELEASE: ORAL | 90 days supply | Qty: 90 | Fill #1

## 2019-11-29 MED FILL — ACCU-CHEK AVIVA PLUS TEST STRIPS: 33 days supply | Qty: 200 | Fill #3

## 2019-11-29 MED FILL — TACROLIMUS 0.5 MG CAPSULE, IMMEDIATE-RELEASE: 30 days supply | Qty: 90 | Fill #8 | Status: AC

## 2019-11-29 MED FILL — DILTIAZEM CD 180 MG CAPSULE,EXTENDED RELEASE 24 HR: 30 days supply | Qty: 60 | Fill #4 | Status: AC

## 2019-11-29 MED FILL — ASPIRIN 81 MG TABLET,DELAYED RELEASE: 90 days supply | Qty: 90 | Fill #1 | Status: AC

## 2019-11-29 MED FILL — TACROLIMUS 0.5 MG CAPSULE, IMMEDIATE-RELEASE: ORAL | 30 days supply | Qty: 90 | Fill #8

## 2019-11-29 MED FILL — ATORVASTATIN 80 MG TABLET: 30 days supply | Qty: 30 | Fill #11

## 2019-11-29 MED FILL — ACCU-CHEK AVIVA PLUS TEST STRIPS: 33 days supply | Qty: 200 | Fill #3 | Status: AC

## 2019-11-29 MED FILL — LEVOTHYROXINE 75 MCG TABLET: ORAL | 30 days supply | Qty: 30 | Fill #4

## 2019-11-29 MED FILL — MYCOPHENOLATE MOFETIL 500 MG TABLET: ORAL | 30 days supply | Qty: 60 | Fill #3

## 2019-11-29 MED FILL — MYCOPHENOLATE MOFETIL 500 MG TABLET: 30 days supply | Qty: 60 | Fill #3 | Status: AC

## 2019-11-29 MED FILL — LEVOTHYROXINE 75 MCG TABLET: 30 days supply | Qty: 30 | Fill #4 | Status: AC

## 2019-11-29 MED FILL — CHLORTHALIDONE 25 MG TABLET: ORAL | 30 days supply | Qty: 30 | Fill #4

## 2019-12-07 DIAGNOSIS — Z941 Heart transplant status: Principal | ICD-10-CM

## 2019-12-24 ENCOUNTER — Telehealth: Payer: Self-pay | Admitting: Family Medicine

## 2019-12-24 MED ORDER — ATORVASTATIN 80 MG TABLET
ORAL_TABLET | Freq: Every day | ORAL | 3 refills | 90 days | Status: CP
Start: 2019-12-24 — End: 2020-12-23
  Filled 2019-12-27: qty 90, 90d supply, fill #0

## 2019-12-24 NOTE — Telephone Encounter (Signed)
I left a message asking the patient to call and schedule Medicare AWV with Loma Sousa ([place]) and follow up with Dr. Yong Channel.  If patient calls back, please schedule both at next available opening. Last AWV 12/20/2017 VDM ([place]ee[place]Dee)

## 2019-12-24 NOTE — Unmapped (Signed)
[facility] Shared [facility] Special[facility] Pharmacy Clinical Assessment & Refill Coordination Note    Andrew Dickerson, DOB: Apr 14, 1956  Phone: [phone] (home)     All above HIPAA information was verified with patient.     Was a Nurse, learning disability used for this call? 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 amLODIPine (NORVASC) 10 MG tablet Take 1 tablet (10 mg total) by mouth daily. 90 tablet 3   ??? aspirin (ADULT LOW DOSE ASPIRIN) 81 MG tablet Take 1 tablet (81 mg total) by mouth daily. 90 tablet 3   ??? atorvastatin (LIPITOR) 80 MG tablet TAKE 1 TABLET (80 MG TOTAL) BY MOUTH DAILY. 30 tablet 11   ??? blood sugar diagnostic Strp CHECK BLOOD SUGAR BEFORE MEALS, AT BEDTIME, & AS NEEDED FOR HYPERGLYCEMIA UP TO 6 TIMES A DAY 200 strip 11   ??? chlorthalidone (HYGROTON) 25 MG tablet Take 1 tablet (25 mg total) by mouth daily. 30 tablet 11   ??? cholecalciferol, vitamin D3, 1,000 unit capsule Take 3,000 Units by mouth daily.     ??? diltiazem (CARDIZEM CD) 180 MG 24 hr capsule Take 2 capsules (360 mg total) by mouth daily. 60 capsule 11   ??? famotidine (PEPCID) 20 MG tablet Take 1 tablet (20 mg total) by mouth Two (2) times a day. 180 tablet 3   ??? levothyroxine (SYNTHROID) 75 MCG tablet Take 1 tablet (75 mcg total) by mouth daily. 30 tablet 11   ??? magnesium oxide (MAGOX) 400 mg (241.3 mg magnesium) tablet Take 2 tablets (800 mg total) by mouth Two (2) times a day. 120 tablet 11   ??? metFORMIN (GLUCOPHAGE) 1000 MG tablet Take 1,000 mg by mouth Two (2) times a day.     ??? mycophenolate (CELLCEPT) 500 mg tablet Take 1 tablet (500 mg total) by mouth Two (2) times a day. 180 tablet 3   ??? tacrolimus (PROGRAF) 0.5 MG capsule Take 2 capsules (1 mg total) by mouth daily AND 1 capsule (0.5 mg total) nightly. 90 capsule 11   ??? valsartan (DIOVAN) 160 MG tablet Take 2 tablets (320 mg total) by mouth daily. 180 tablet 3     Current Facility-Administered Medications   Medication Dose Route Frequency Provider Last Rate Last Admin   ??? magnesium oxide (MAG-OX) tablet 800 mg  800 mg Oral Once Liliane Shi, MD            Changes to medications: [name] reports no changes at this time.    No Known Allergies      Changes to allergies: No    SPECIALTY MEDICATION ADHERENCE     Mycophenolate 500 mg: 6 days of medicine on hand   Tacrolimus 0.5 mg: 10 days of medicine on hand       Medication Adherence    Patient reported X missed doses in the last month: 0  Specialty Medication: Tacrolimus 0.5mg   Patient is on additional specialty medications: Yes  Additional Specialty Medications: Mycophenolate 500mg   Patient Reported Additional Medication X Missed Doses in the Last Month: 0  Patient is on more than two specialty medications: No          Specialty medication(s) dose(s) confirmed: Regimen is correct and unchanged.     Are there any concerns with adherence? No    Adherence counseling provided? Not needed    CLINICAL MANAGEMENT AND INTERVENTION  Clinical Benefit Assessment:    Do you feel the medicine is effective or helping your condition? Yes    Clinical Benefit counseling provided? Not needed    Adverse Effects Assessment:    Are you experiencing any side effects? No    Are you experiencing difficulty administering your medicine? No    Quality of Life Assessment:    How many days over the past month did your heart transplant  keep you from your normal activities? For example, brushing your teeth or getting up in the morning. 0    Have you discussed this with your provider? Not needed    Therapy Appropriateness:    Is therapy appropriate? Yes, therapy is appropriate and should be continued    DISEASE/MEDICATION-SPECIFIC INFORMATION      N/A    PATIENT SPECIFIC NEEDS     - Does the patient have any physical, cognitive, or cultural barriers? No    - Is the patient high risk? Yes, patient is taking a REMS drug. Medication is dispensed in compliance with REMS program.     - Does the patient require a Care Management Plan? No     - Does the patient require physician intervention or other additional services (i.e. nutrition, smoking cessation, social work)? 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     Medication will be delivered via UPS to the confirmed prescription address in [facility].    The patient will receive a drug information handout for each medication shipped and additional FDA Medication Guides as required.  Verified that patient has previously received a Conservation officer, historic buildings.    All of the patient's questions and concerns have been addressed.    [name]   [facility] Specialty Pharmacist

## 2019-12-27 MED FILL — DILTIAZEM CD 180 MG CAPSULE,EXTENDED RELEASE 24 HR: 30 days supply | Qty: 60 | Fill #5 | Status: AC

## 2019-12-27 MED FILL — TACROLIMUS 0.5 MG CAPSULE, IMMEDIATE-RELEASE: ORAL | 30 days supply | Qty: 90 | Fill #9

## 2019-12-27 MED FILL — LEVOTHYROXINE 75 MCG TABLET: 30 days supply | Qty: 30 | Fill #5 | Status: AC

## 2019-12-27 MED FILL — CHLORTHALIDONE 25 MG TABLET: 30 days supply | Qty: 30 | Fill #5 | Status: AC

## 2019-12-27 MED FILL — DILTIAZEM CD 180 MG CAPSULE,EXTENDED RELEASE 24 HR: ORAL | 30 days supply | Qty: 60 | Fill #5

## 2019-12-27 MED FILL — TACROLIMUS 0.5 MG CAPSULE, IMMEDIATE-RELEASE: 30 days supply | Qty: 90 | Fill #9 | Status: AC

## 2019-12-27 MED FILL — CHLORTHALIDONE 25 MG TABLET: ORAL | 30 days supply | Qty: 30 | Fill #5

## 2019-12-27 MED FILL — MYCOPHENOLATE MOFETIL 500 MG TABLET: ORAL | 30 days supply | Qty: 60 | Fill #4

## 2019-12-27 MED FILL — LEVOTHYROXINE 75 MCG TABLET: ORAL | 30 days supply | Qty: 30 | Fill #5

## 2019-12-27 MED FILL — ATORVASTATIN 80 MG TABLET: 90 days supply | Qty: 90 | Fill #0 | Status: AC

## 2019-12-27 MED FILL — MYCOPHENOLATE MOFETIL 500 MG TABLET: 30 days supply | Qty: 60 | Fill #4 | Status: AC

## 2020-01-18 MED ORDER — ACCU-CHEK AVIVA PLUS TEST STRIPS
ORAL_STRIP | SUBCUTANEOUS | 11 refills | 0.00000 days | Status: CP
Start: 2020-01-18 — End: 2021-01-17
  Filled 2020-01-24: qty 200, 33d supply, fill #0

## 2020-01-18 NOTE — Unmapped (Signed)
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     Was a Nurse, learning disability used for this call? No    Completed refill call assessment today to schedule patient's medication shipment from the [facility] [phone]).       Specialty medication(s) and dose(s) confirmed: Regimen is correct and unchanged.   Changes to medications: Daymeon reports no changes at this time.  Changes to insurance: No  Questions for the pharmacist: No    Confirmed patient received Welcome Packet with first shipment. The patient will receive a drug information handout for each medication shipped and additional FDA Medication Guides as required.       DISEASE/MEDICATION-SPECIFIC INFORMATION        N/A    SPECIALTY MEDICATION ADHERENCE     Medication Adherence    Patient reported X missed doses in the last month: 0  Specialty Medication: Mycophenolate 500mg   Patient is on additional specialty medications: Yes  Additional Specialty Medications: Tacrolimus 0.5mg   Patient Reported Additional Medication X Missed Doses in the Last Month: 0  Patient is on more than two specialty medications: No          Mycophenolate 500 mg: 8 days of medicine on hand   Tacrolimus 0.5 mg: 8 days of medicine on hand     SHIPPING     Shipping address confirmed in Epic.     Delivery Scheduled: Yes, Expected medication delivery date: 01/25/2020.     Medication will be delivered via UPS to the prescription address in Epic WAM.    [name] [facility] Specialty Technician

## 2020-01-21 DIAGNOSIS — Z48298 Encounter for aftercare following other organ transplant: Secondary | ICD-10-CM | POA: Diagnosis not present

## 2020-01-21 DIAGNOSIS — E559 Vitamin D deficiency, unspecified: Secondary | ICD-10-CM | POA: Diagnosis not present

## 2020-01-21 DIAGNOSIS — Z79899 Other long term (current) drug therapy: Secondary | ICD-10-CM | POA: Diagnosis not present

## 2020-01-21 DIAGNOSIS — Z941 Heart transplant status: Secondary | ICD-10-CM | POA: Diagnosis not present

## 2020-01-21 DIAGNOSIS — E785 Hyperlipidemia, unspecified: Secondary | ICD-10-CM | POA: Diagnosis not present

## 2020-01-21 DIAGNOSIS — Z125 Encounter for screening for malignant neoplasm of prostate: Secondary | ICD-10-CM | POA: Diagnosis not present

## 2020-01-21 LAB — LIPID PANEL
Cholesterol: 145 (ref 0–200)
HDL: 29 — AB (ref 35–70)
LDL Cholesterol: 88
Triglycerides: 183 — AB (ref 40–160)

## 2020-01-21 LAB — TSH: TSH: 1.45 (ref 0.41–5.90)

## 2020-01-21 LAB — HEMOGLOBIN A1C: Hemoglobin A1C: 6.1

## 2020-01-24 MED FILL — CHLORTHALIDONE 25 MG TABLET: ORAL | 30 days supply | Qty: 30 | Fill #6

## 2020-01-24 MED FILL — TACROLIMUS 0.5 MG CAPSULE, IMMEDIATE-RELEASE: 30 days supply | Qty: 90 | Fill #10 | Status: AC

## 2020-01-24 MED FILL — DILTIAZEM CD 180 MG CAPSULE,EXTENDED RELEASE 24 HR: 30 days supply | Qty: 60 | Fill #6 | Status: AC

## 2020-01-24 MED FILL — ACCU-CHEK AVIVA PLUS TEST STRIPS: 33 days supply | Qty: 200 | Fill #0 | Status: AC

## 2020-01-24 MED FILL — LEVOTHYROXINE 75 MCG TABLET: 30 days supply | Qty: 30 | Fill #6 | Status: AC

## 2020-01-24 MED FILL — DILTIAZEM CD 180 MG CAPSULE,EXTENDED RELEASE 24 HR: ORAL | 30 days supply | Qty: 60 | Fill #6

## 2020-01-24 MED FILL — LEVOTHYROXINE 75 MCG TABLET: ORAL | 30 days supply | Qty: 30 | Fill #6

## 2020-01-24 MED FILL — MYCOPHENOLATE MOFETIL 500 MG TABLET: 30 days supply | Qty: 60 | Fill #5 | Status: AC

## 2020-01-24 MED FILL — VALSARTAN 160 MG TABLET: ORAL | 90 days supply | Qty: 180 | Fill #1

## 2020-01-24 MED FILL — CHLORTHALIDONE 25 MG TABLET: 30 days supply | Qty: 30 | Fill #6 | Status: AC

## 2020-01-24 MED FILL — VALSARTAN 160 MG TABLET: 90 days supply | Qty: 180 | Fill #1 | Status: AC

## 2020-01-24 MED FILL — TACROLIMUS 0.5 MG CAPSULE, IMMEDIATE-RELEASE: ORAL | 30 days supply | Qty: 90 | Fill #10

## 2020-01-24 MED FILL — MYCOPHENOLATE MOFETIL 500 MG TABLET: ORAL | 30 days supply | Qty: 60 | Fill #5

## 2020-01-28 ENCOUNTER — Other Ambulatory Visit: Payer: Self-pay | Admitting: Family Medicine

## 2020-01-28 DIAGNOSIS — R7309 Other abnormal glucose: Secondary | ICD-10-CM

## 2020-01-28 LAB — CBC W/ DIFFERENTIAL
EOSINOPHILS ABSOLUTE COUNT: 0.1 10*9/L
EOSINOPHILS RELATIVE PERCENT: 1.9 %
HEMOGLOBIN: 13.9 g/dL
LYMPHOCYTES ABSOLUTE COUNT: 1.9 10*9/L
LYMPHOCYTES RELATIVE PERCENT: 19 %
MEAN CORPUSCULAR HEMOGLOBIN CONC: 32 g/dL
MEAN CORPUSCULAR HEMOGLOBIN: 28.5 pg
MEAN CORPUSCULAR VOLUME: 88.9 fL
MEAN PLATELET VOLUME: 10.4 fL
MONOCYTES ABSOLUTE COUNT: 0.2 10*9/L
MONOCYTES RELATIVE PERCENT: 6.6 %
NEUTROPHILS ABSOLUTE COUNT: 7.2 10*9/L
PLATELET COUNT: 284 10*9/L
RED BLOOD CELL COUNT: 4.88 10*12/L
RED CELL DISTRIBUTION WIDTH: 13.1 %
WHITE BLOOD CELL COUNT: 10 10*9/L

## 2020-01-28 LAB — COMPREHENSIVE METABOLIC PANEL
ALKALINE PHOSPHATASE: 86 U/L
ALT (SGPT): 20 U/L
AST (SGOT): 11 U/L
BILIRUBIN TOTAL: 0.7 mg/dL
BLOOD UREA NITROGEN: 23 mg/dL
CALCIUM: 9.4 mg/dL
CHLORIDE: 104 mmol/L
CO2: 19 mmol/L — AB (ref 20.0–32.0)
CREATININE: 1.27 mg/dL — AB (ref 0.70–1.25)
GLUCOSE RANDOM: 138 mg/dL — AB (ref 65–99)
POTASSIUM: 4.6 mmol/L
PROTEIN TOTAL: 6.7 g/dL
SODIUM: 136 mmol/L

## 2020-01-28 LAB — HEMOGLOBIN A1C
HEMOGLOBIN A1C: 6.1 % — AB (ref <=5.7)
Lab: 6.1 — AB

## 2020-01-28 LAB — EGFR CKD-EPI NON-AA FEMALE: Lab: 0

## 2020-01-28 LAB — LIPID PANEL
CHOLESTEROL/HDL RATIO SCREEN: 5 — AB
CHOLESTEROL: 145 mg/dL
LDL CHOLESTEROL CALCULATED: 88 mg/dL

## 2020-01-28 LAB — MAGNESIUM: Lab: 1.6

## 2020-01-28 LAB — PROSTATE SPECIFIC ANTIGEN: Lab: 0.7

## 2020-01-28 LAB — CHOLESTEROL/HDL RATIO SCREEN: Lab: 5 — AB

## 2020-01-28 LAB — TACROLIMUS, TROUGH: Lab: 9

## 2020-01-28 LAB — MEAN CORPUSCULAR HEMOGLOBIN CONC: Lab: 32

## 2020-01-28 LAB — THYROID STIMULATING HORMONE: Lab: 1.45

## 2020-01-28 LAB — ALBUMIN
ALBUMIN: 4.5 g/dL
Lab: 4.5

## 2020-01-28 NOTE — Unmapped (Signed)
Discussed recent labs with Damita Dunnings, PharmD.  Plan is to Make No Changes  with repeat labs in 1 Month.    Have reached out to inquire about timing of medications since level is above goal.   Sent patient MyChart message with results and follow up plan       Lab Results   Component Value Date    TACROLIMUS 9.0 01/21/2020     Goal: Tac: 5-8  Current Dose: Tacrolimus 1 mg/0.5 mg    Lab Results   Component Value Date    BUN 23 01/21/2020    CREATININE 1.27 (A) 01/21/2020    K 4.6 01/21/2020    GLU 138 (A) 01/21/2020    MG 1.6 01/21/2020     Lab Results   Component Value Date    WBC 10.0 01/21/2020    HGB 13.9 01/21/2020    HCT 43.4 01/21/2020    PLT 284 01/21/2020    NEUTROABS 7.2 01/21/2020    EOSABS 0.1 01/21/2020

## 2020-02-15 NOTE — Unmapped (Signed)
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     Was a Nurse, learning disability used for this call? No    Completed refill call assessment today to schedule patient's medication shipment from the [facility] [phone]).       Specialty medication(s) and dose(s) confirmed: Regimen is correct and unchanged.   Changes to medications: Vi reports no changes at this time.  Changes to insurance: No  Questions for the pharmacist: No    Confirmed patient received Welcome Packet with first shipment. The patient will receive a drug information handout for each medication shipped and additional FDA Medication Guides as required.       DISEASE/MEDICATION-SPECIFIC INFORMATION        N/A    SPECIALTY MEDICATION ADHERENCE     Medication Adherence    Patient reported X missed doses in the last month: 0        mycophenolate mofetil 500mg : 10 days worth of medication on hand.  tacrolimus 0.5mg : 10 days worth of medication on hand.            SHIPPING     Shipping address confirmed in Epic.     Delivery Scheduled: Yes, Expected medication delivery date: 02/21/20.     Medication will be delivered via UPS to the prescription address in Epic WAM.    [place] A Mahina Salatino   [facility] Shared [facility] Specialty Technician

## 2020-02-20 NOTE — Unmapped (Signed)
Andrew Dickerson 's entire order shipment will be delayed as a result of the medication is too soon to refill until 02/21/20.     I have reached out to the patient and communicated the delivery change. We will reschedule the medication for the delivery date that the patient agreed upon.  We have confirmed the delivery date as 02/22/20, via ups.

## 2020-02-21 MED FILL — LEVOTHYROXINE 75 MCG TABLET: ORAL | 30 days supply | Qty: 30 | Fill #7

## 2020-02-21 MED FILL — CHLORTHALIDONE 25 MG TABLET: 30 days supply | Qty: 30 | Fill #7 | Status: AC

## 2020-02-21 MED FILL — AMLODIPINE 10 MG TABLET: ORAL | 90 days supply | Qty: 90 | Fill #2

## 2020-02-21 MED FILL — TACROLIMUS 0.5 MG CAPSULE, IMMEDIATE-RELEASE: 30 days supply | Qty: 90 | Fill #11 | Status: AC

## 2020-02-21 MED FILL — DILTIAZEM CD 180 MG CAPSULE,EXTENDED RELEASE 24 HR: 30 days supply | Qty: 60 | Fill #7 | Status: AC

## 2020-02-21 MED FILL — ACCU-CHEK AVIVA PLUS TEST STRIPS: 33 days supply | Qty: 200 | Fill #1 | Status: AC

## 2020-02-21 MED FILL — LEVOTHYROXINE 75 MCG TABLET: 30 days supply | Qty: 30 | Fill #7 | Status: AC

## 2020-02-21 MED FILL — TACROLIMUS 0.5 MG CAPSULE, IMMEDIATE-RELEASE: ORAL | 30 days supply | Qty: 90 | Fill #11

## 2020-02-21 MED FILL — FAMOTIDINE 20 MG TABLET: 90 days supply | Qty: 180 | Fill #2 | Status: AC

## 2020-02-21 MED FILL — MYCOPHENOLATE MOFETIL 500 MG TABLET: 30 days supply | Qty: 60 | Fill #6 | Status: AC

## 2020-02-21 MED FILL — ACCU-CHEK AVIVA PLUS TEST STRIPS: 33 days supply | Qty: 200 | Fill #1

## 2020-02-21 MED FILL — MYCOPHENOLATE MOFETIL 500 MG TABLET: ORAL | 30 days supply | Qty: 60 | Fill #6

## 2020-02-21 MED FILL — CHLORTHALIDONE 25 MG TABLET: ORAL | 30 days supply | Qty: 30 | Fill #7

## 2020-02-21 MED FILL — AMLODIPINE 10 MG TABLET: 90 days supply | Qty: 90 | Fill #2 | Status: AC

## 2020-02-21 MED FILL — ASPIRIN 81 MG TABLET,DELAYED RELEASE: ORAL | 90 days supply | Qty: 90 | Fill #2

## 2020-02-21 MED FILL — ASPIRIN 81 MG TABLET,DELAYED RELEASE: 90 days supply | Qty: 90 | Fill #2 | Status: AC

## 2020-02-21 MED FILL — FAMOTIDINE 20 MG TABLET: ORAL | 90 days supply | Qty: 180 | Fill #2

## 2020-02-21 MED FILL — DILTIAZEM CD 180 MG CAPSULE,EXTENDED RELEASE 24 HR: ORAL | 30 days supply | Qty: 60 | Fill #7

## 2020-02-26 DIAGNOSIS — Z48298 Encounter for aftercare following other organ transplant: Secondary | ICD-10-CM | POA: Diagnosis not present

## 2020-02-26 DIAGNOSIS — Z79899 Other long term (current) drug therapy: Secondary | ICD-10-CM | POA: Diagnosis not present

## 2020-02-26 DIAGNOSIS — R7989 Other specified abnormal findings of blood chemistry: Secondary | ICD-10-CM | POA: Diagnosis not present

## 2020-02-26 DIAGNOSIS — Z941 Heart transplant status: Secondary | ICD-10-CM | POA: Diagnosis not present

## 2020-02-29 LAB — BASOPHILS ABSOLUTE COUNT: Lab: 0.1

## 2020-02-29 LAB — CBC W/ DIFFERENTIAL
BASOPHILS ABSOLUTE COUNT: 0.1 10*9/L
BASOPHILS RELATIVE PERCENT: 0.8 %
EOSINOPHILS ABSOLUTE COUNT: 0.2 10*9/L
EOSINOPHILS RELATIVE PERCENT: 1.8 %
HEMATOCRIT: 42.1 %
HEMOGLOBIN: 13.8 g/dL
MEAN CORPUSCULAR HEMOGLOBIN CONC: 89.6 g/dL
MEAN CORPUSCULAR HEMOGLOBIN: 29.4 pg
MEAN CORPUSCULAR VOLUME: 89.6 fL
MEAN PLATELET VOLUME: 10.4 fL
MONOCYTES ABSOLUTE COUNT: 0.7 10*9/L
MONOCYTES RELATIVE PERCENT: 6.6 %
NEUTROPHILS ABSOLUTE COUNT: 7.9 10*9/L — ABNORMAL HIGH
NEUTROPHILS RELATIVE PERCENT: 70 %
PLATELET COUNT: 310 10*9/L
RED BLOOD CELL COUNT: 4.7 10*12/L
RED CELL DISTRIBUTION WIDTH: 13.2 %
WHITE BLOOD CELL COUNT: 11.3 10*9/L — ABNORMAL HIGH

## 2020-02-29 LAB — COMPREHENSIVE METABOLIC PANEL
BLOOD UREA NITROGEN: 44 mg/dL — ABNORMAL HIGH
CALCIUM: 10.4 mg/dL — ABNORMAL HIGH
CO2: 15 mmol/L — ABNORMAL LOW
CREATININE: 1.61 mg/dL — ABNORMAL HIGH
EGFR CKD-EPI AA MALE: 52 mL/min/{1.73_m2} — ABNORMAL LOW
EGFR CKD-EPI NON-AA MALE: 45 mL/min/{1.73_m2} — ABNORMAL LOW
GLUCOSE RANDOM: 130 mg/dL — ABNORMAL HIGH
POTASSIUM: 4.7 mmol/L
SODIUM: 136 mmol/L

## 2020-02-29 LAB — EGFR CKD-EPI AA MALE: Lab: 52 — ABNORMAL LOW

## 2020-02-29 LAB — TACROLIMUS, TROUGH: Lab: 6

## 2020-02-29 LAB — MAGNESIUM: Lab: 1.8

## 2020-03-01 NOTE — Unmapped (Signed)
Discussed recent labs with Damita Dunnings, PharmD.  Plan is to Make No Changes  with repeat labs in 1 Month.  Will recheck based on jump in Cr.   Sent patient MyChart message with lab results and follow up plan.         Lab Results   Component Value Date    TACROLIMUS 6.0 02/26/2020     Goal: Tac: 5-8  Current Dose: Tacrolimus 1 mg/0.5 mg    Lab Results   Component Value Date    BUN 44 (H) 02/26/2020    CREATININE 1.61 (H) 02/26/2020    K 4.7 02/26/2020    GLU 130 (H) 02/26/2020    MG 1.8 02/26/2020     Lab Results   Component Value Date    WBC 11.3 (H) 02/26/2020    HGB 13.8 02/26/2020    HCT 42.1 02/26/2020    PLT 310 02/26/2020    NEUTROABS 7.9 (H) 02/26/2020    EOSABS 0.2 02/26/2020

## 2020-03-18 DIAGNOSIS — Z941 Heart transplant status: Principal | ICD-10-CM

## 2020-03-18 NOTE — Unmapped (Signed)
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     Was a Nurse, learning disability used for this call? No    Completed refill call assessment today to schedule patient's medication shipment from the [facility] [phone]).       Specialty medication(s) and dose(s) confirmed: Regimen is correct and unchanged.   Changes to medications: Andrew Dickerson reports no changes at this time.  Changes to insurance: No  Questions for the pharmacist: No    Confirmed patient received Welcome Packet with first shipment. The patient will receive a drug information handout for each medication shipped and additional FDA Medication Guides as required.       DISEASE/MEDICATION-SPECIFIC INFORMATION        N/A    SPECIALTY MEDICATION ADHERENCE     Medication Adherence    Patient reported X missed doses in the last month: 0        mycophenolate mofetil 500mg : 10 days worth of medication on hand.  tacrolimus 0.5mg : 10 days worth of medication on hand.  ??          SHIPPING     Shipping address confirmed in Epic.     Delivery Scheduled: Yes, Expected medication delivery date: 03/26/20.     Medication will be delivered via UPS to the prescription address in Epic WAM.    Andrew Dickerson   [facility] Shared [facility] Specialty Technician

## 2020-03-19 MED ORDER — TACROLIMUS 0.5 MG CAPSULE, IMMEDIATE-RELEASE
ORAL_CAPSULE | ORAL | 11 refills | 30 days | Status: CP
Start: 2020-03-19 — End: ?
  Filled 2020-03-25: qty 90, 30d supply, fill #0

## 2020-03-24 DIAGNOSIS — Z941 Heart transplant status: Principal | ICD-10-CM

## 2020-03-24 DIAGNOSIS — E559 Vitamin D deficiency, unspecified: Principal | ICD-10-CM

## 2020-03-24 NOTE — Unmapped (Unsigned)
[facility] Visit         Primary Provider: Shelva Majestic, MD   [facility]: Melvia Heaps, MD??   Dermatologist:     Cherlyn Dry, MD     Reason for Visit:  Andrew Dickerson is a 64 y.o. male who is being seen for continued post-transplant care as his annual heart transplant clinic follow up.     Assessment & Plan:  Overall doing well.  Restart Flomax which may also help BP and Cr.  Health maintenance as below; DSE as next cardiac test.    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  On dual immunosuppression (mycophenolate 500 bid and tacrolimus 1/0.5 with goal 5-8) with normal cardiac function as again seen on today's echo.  Occasional single low level DSA (DR 16); no significant DSA's detected recently. No CAV.  - He is on Tac 1 mg /0.5 mg.   Lab Results   Component Value Date    Tacrolimus, Trough 6.0 02/26/2020    Tacrolimus, Trough 9.0 01/21/2020    Tacrolimus, Trough 7.6 11/29/2014    Tacrolimus, Trough 9.2 07/24/2014   - No changes (last tac dose adjustment 01/2018)   - His next diagnostic testing will be a Dobutamine Stress Echo/DSE in 08/2020  - Repeat DSAs today, pending  - Return to clinic June or July 2022, sooner PRN    # Hypertension.  Well controlled on his current 4 drug regimen (Amlodipine 10 qAM, Diltiazem 360 qPM, Valsartan 320 mg, Chlorthalidone 25 qAM). (Amlodipine increased from 5 to 10 in 07/2018.)     Patient was transitioned from Lisinopril to Valsartan 160 mg daily, 08/2019 due to elevated blood pressures. He was further increased to 320 mg daily 09/2019 after BP remained elevated  - No change as reported BPs are pre-BP meds.  Encouraged him to check BP after AM meds. Continue to monitor and further adjust PR.    - Encouraged he correlate his BP cuff with his next PCP clinic visit since he forgot to bring it today    # Hyperlipidemia. Tolerating statin therapy - Atorvastatin 80 mg.     -08/2019 LDL 100, A1c 6. Most recent:  Lab Results Component Value Date    CHOL 145 01/21/2020    LDL 88 01/21/2020    HDL 29 (A) 01/21/2020    TRIG 183 (A) 01/21/2020    A1C 6.1 (A) 01/21/2020   - Since no CAV, LDL <100 is reasonable with reasonable A1c; favor more diet control efforts before starting Zetia.    # Diabetes, Obesity. On Metformin 1000 mg BID since at least 2017, managed by PCP.    - Most recent HgbA1c 6.1% 01/2020 (previously as high as 13.5% 12/2015; well-controlled since)  - Encouraging ramping up exercise to help with DM and BP     # Hypothyroidism. TSH is 1.45 in 01/2020 on supplementation Synthroid 75 mcg (TSH WNL since 2012).   - Continue to monitor periodically.    # BPH, presumed.  Patient has urinary urgency which he didn't have in 2019 when we stopped Flomax (as we were unclear if he needed it). He reports his prostate was microwaved (internally) years ago (pre-LVAD) by Dr. Patsi Sears of [facility] who retired.  - Restart Flomax and encourage he see [facility] again with a new urologist  - PSA WNL as below.    # Renal function, Electrolytes.   - His Cr ranged from 1.0-1.3, until recent check where Cr was 1.6 (02/26/20,  when Tac level 6)  - Repeat today 1.2, which is reassuring, and may improve further after starting Flomax  - Mg levels have been relatively low (1.3 in 11/2018 and chronically <1.5 on Mg Oxide 400 QD OTC gel caps which he prefers)  - Most recent Mg levels WNL on Mg 400 mg BID ( increased dose causing diarrhea)    # GERD. Previously on Omeprazole, on Famotidine BID since 07/2018. Only noted heartburn when supine in 2020; no issues in 2021.  - Continue Famotidine, as he desires no change in regimen.      # Health maintenance     -Dental: Has dentures  -Eyes: within the last year. Goes to [facility] in [employer] [place]  -PSA: 0.7 ( 01/21/20)  -Dermatology: Uncertain of date but less than 2 years, more than 1 year. Recommend he schedule visit.  -Colonoscopy: Last done on 04/2017, follow up due 2028 (Dr. Arlyce Dice, [facility])  -CXR: 08/13/19, normal lungs  >Endocrine  -TSH ??? 1.4 ( 01/2020) , on Synthroid (as above)  -Vitamin D- 38.7 (03/25/20), 44 (07/2018), on Vitamin D 3000 units daily - no change  -AIC  - as above.  >ID/Vaccincations:   - Flu Shot: received at [facility] late 2020  - Prevnar13: 07/18/12  - PPSV23: 02/23/2019  - Tetanus: last 06/09/2012, due 2023  - Shingrix: Recommended he get with PCP if available  - COVID 19: 11/04/19, 12/01/19    Next testing: DSE  Labs: in 2 weeks  (then quarterly)  Clinic visit: Return to clinic in May 2022      Patient was also seen by Melene Muller, RN, transplant coordinator, who also served as a Neurosurgeon.  - Joya Gaskins, MD      History of Present Illness:  Andrew Dickerson is a 64 y.o. male with underwent LVAD, then a heart transplantation for ischemic cardiomyopathy on 07/18/2012. His post-transplant course has been notable for only for labile DM control (hospitalization locally for DKA 12/2015).  His cardiac status has been normal/stable, no CAV, occasional single DSA with MFI >1000, and he remains on chronic dual immunosuppression of  Tacrolimus and Mycophenolate.  His cardiac transplant-related diagnostic testing is detailed below.  His last endomyocardial biopsy was 08/24/16 (ISHLT grade 1R/1A).     Summary of recent visits/events:  - 02/01/2017 'annual' post-transplant [facility] clinic for his clinic visit.  Overall well, no medication changes, with now impressive control of his diabetes (HgA1c 5.4), well-controlled BP on 4 antihypertensives, but he still could lose weight (lowest post-transplant weight was 218 lbs last year 02/2016, when HgA1c was 7.9). Thus, we encouraged more exercise for intentional weight loss (goal <220 lbs), and to maintain current good health maintenance.    -01/17/2018 'annual' post-transplant [facility] clinic: doing overall well; medication changes that day: decrease Tacrolimus back to 1 mg qAM, 0.5 mg qPM.  In 11/2017, he URI, saw PCP for cough, fever, given doxycycline x 1 week and Tessalon perles with resolution of symptoms.  Well since, trying to lose weight, watching his diet (records all his food intake), goes to gym to walk 4-5 miles, 3x week MWF (goal of ~6000 steps/day on average), DM well controlled on metformin.  -02/21/19 Video visit as 'annual' post-transplant Cardiology clinic: Overall doing well (with interim med changes by our CPP: Omeprazole changed to Famotidine 20 mg BID, and tamsulosin discontinued 07/2018).  Med changes with 02/2019 visit: Double MgOxide 400 mg softgels to 800 mg/day.  Health maintenance reviewed - e.g., See  PCP soon to update immunizations (PPSV23, Td, Shringrix)Repeat labs this month (usually [facility]), including repeat HgA1c to reassess home monitor accuracy and metformin, as tries to exercise more and lose weight     Interval History:  Since his last visit, doing well, no interim acute illnesses or hospital/ED visits. In 09/2019, his meds were changed which he has tolerated: transitioned from Lisinopril to Valsartan which was later increased to 320 mg/d; MgOxide increased to 800 mg BID.   His main complaint today is that since off Flomax x 2 years, he feels more urinary urgency and wonders if he should start it again.  Only other symptom is mild LE edema after standing all day, resolves overnight (normal in the AM).  Feels well otherwise, though not specifically exercising.   He notes no dyspnea with exertion or at rest, orthopnea, PND, chest pain, palpitations, dizziness/lightheadnedness, presyncope, syncope. No nausea, vomiting, consipation. Had diarrhea once over past many months. No heartburn on Famotidine BID. BPs at home 140-160s/70-90s, but he checks in the AM before taking his antiHTN meds (4 total, 2 in the AM, 2 in the PM), no sxs.  Weight stable 237-239 lbs. Notes some blurry vision. Sleeps on 2 pillows for comfort. He is accompanied by his mother today.      Pertinent Post-operative Testing/History:  Post-Operative course:  His post-operative course required continued antibiotic therapy and Wound vac for his pre-existing LVAD driveline infection. Antihypertensive medications were slowly introduced post-HTx.    CMV D-/R-.  Toxo D-/R-.      Cardiac Diagnostic Testing:   ?? 07/18/13: LHC - no cardiac allograft vasculopathy (CAV)  ?? 08/06/14: LHC - no CAV   ?? 09/04/15: LHC - no CAV   ?? 08/23/16: LHC - no CAV    ?? 07/26/17: LHC - no CAV   ?? 07/19/18: DSE (Dobutamine Stress Echo) - normal  ?? 08/13/19: LHC - no CAV    Echo:  ?? 07/26/12: LVEF 60-65%  ?? 08/28/12: LVEF 60-65%  ?? 12/04/12: LVEF 60-65%  ?? 01/04/13: LVEF >55%  ?? 05/14/13: LVEF 60-65%  ?? 02/12/14: LVEF 60-65%  ?? 01/17/15: LVEF 65-70%  ?? 02/10/16: LVEF 60-65%  ?? 02/01/17: LVEF 65%  ?? 01/17/18: LVEF 60-65%  ?? 07/19/18: Normal LVEF on DSE  ?? 03/25/20: LVEF 65-70%    DSA:   ?? 08/01/12: No DSA (with MFI>1000)  ?? 08/18/12: No DSA  ?? 12/04/12: No DSA  ?? 04/09/13: No DSA  ?? 02/12/14: DR16, MFI 1499  ?? 01/07/15: DR16, MFI 1692  ?? 09/04/15: DR 16, MFI 3322  ?? 02/10/16: DR16, MFI 1644  ?? 02/01/17: DR16, MFI 1495  ?? 01/17/18: No DSA  ?? 08/13/19: No DSA  ?? 03/25/20: Pending     Rejection History:   ?? 08/02/2012: Mild acute cellular rejection, ISHLT 1R/2. His immunosupressive therapy was continued and Tacrolimus has been titrated with daily levels for a current goal of 10-12.   ?? 09/11/12: showed mild rejection, ISHLT 1R/2 (One small foci of infiltrate, background is clean). His Prednisone was decreased to 12.5 mg daily. Subsequent biopsies have showed no rejection and so his immunosuppression was gradually weaned as per our practice protocol.  ?? 01/05/13: Baseline Allomap 34  ?? 02/03/13: Prednisone stopped  ?? 04/09/13: Allomap off Prednisone 33       Past Medical History:  Past Medical History:   Diagnosis Date   ??? Coronary atherosclerosis of native coronary artery 04/21/2009    12/92: CABG x 5 with LIMA to LAD and saphenous  grafts to diagonal 1, obtuse marginal, distal circ and distal PDA 8/00: Repeat CABG x 3 with redo free left radial to distal LAD, saphenous to RCA, obtuse margina 2005: multiple stents placed to vein grafts and native arteries    ??? DM (diabetes mellitus) (CMS-HCC) 12/27/2012   ??? Hypothyroidism 05/07/2011   ??? LVAD (left ventricular assist device) present (CMS-HCC) 12/27/2012    05/09/09 received heart mate II    ??? Mixed hyperlipidemia 05/07/2011   ??? Obesity 12/27/2012     Past Surgical History:   Procedure Laterality Date   ??? HEART TRANSPLANT  07/18/12    CMV D-/R-   ??? LEFT VENTRICULAR ASSIST DEVICE  05/09/09   ??? PR BIOPSY OF HEART LINING N/A 09/04/2015    Procedure: Left/Right Heart Catheterization W Biopsy;  Surgeon: Lesle Reek, MD;  Location: [facility] CATH;  Service: Cardiology   ??? PR CATH PLACE/CORON ANGIO, IMG SUPER/INTERP,R&L HRT CATH, L HRT VENTRIC N/A 08/24/2016    Procedure: Left/Right Heart Catheterization W Intervention;  Surgeon: Lesle Reek, MD;  Location: [facility] CATH;  Service: Cardiology   ??? PR CATH PLACE/CORON ANGIO, IMG SUPER/INTERP,W LEFT HEART VENTRICULOGRAPHY N/A 07/26/2017    Procedure: Left Heart Catheterization;  Surgeon: Rosana Hoes, MD;  Location: [facility] CATH;  Service: Cardiology   ??? PR CATH PLACE/CORON ANGIO, IMG SUPER/INTERP,W LEFT HEART VENTRICULOGRAPHY N/A 08/13/2019    Procedure: Left Heart Catheterization;  Surgeon: Marlaine Hind, MD;  Location: [facility] CATH;  Service: Cardiology       Allergies: Patient has no known allergies.    Medications:   Current Outpatient Medications on File Prior to Visit   Medication Sig   ??? amLODIPine (NORVASC) 10 MG tablet Take 1 tablet (10 mg total) by mouth daily.   ??? aspirin (ADULT LOW DOSE ASPIRIN) 81 MG tablet Take 1 tablet (81 mg total) by mouth daily.   ??? atorvastatin (LIPITOR) 80 MG tablet Take 1 tablet (80 mg total) by mouth daily.   ??? chlorthalidone (HYGROTON) 25 MG tablet Take 1 tablet (25 mg total) by mouth daily.   ??? cholecalciferol, vitamin D3, 1,000 unit capsule Take 3,000 Units by mouth daily. ??? diltiazem (CARDIZEM CD) 180 MG 24 hr capsule Take 2 capsules (360 mg total) by mouth daily.   ??? famotidine (PEPCID) 20 MG tablet Take 1 tablet (20 mg total) by mouth Two (2) times a day.   ??? levothyroxine (SYNTHROID) 75 MCG tablet Take 1 tablet (75 mcg total) by mouth daily.   ??? magnesium oxide (MAGOX) 400 mg (241.3 mg magnesium) tablet Take 2 tablets (800 mg total) by mouth Two (2) times a day.   ??? metFORMIN (GLUCOPHAGE) 1000 MG tablet Take 1,000 mg by mouth Two (2) times a day.   ??? mycophenolate (CELLCEPT) 500 mg tablet Take 1 tablet (500 mg total) by mouth Two (2) times a day.   ??? tacrolimus (PROGRAF) 0.5 MG capsule Take 2 capsules (1 mg total) by mouth daily AND 1 capsule (0.5 mg total) nightly.   ??? valsartan (DIOVAN) 160 MG tablet Take 2 tablets (320 mg total) by mouth daily.   ??? blood sugar diagnostic (ACCU-CHEK AVIVA PLUS TEST STRP) Strp CHECK BLOOD SUGAR BEFORE MEALS, AT BEDTIME, & AS NEEDED FOR HYPERGLYCEMIA UP TO 6 TIMES A DAY     Current Facility-Administered Medications on File Prior to Visit   Medication   ??? magnesium oxide (MAG-OX) tablet 800 mg   NOTES  - MgOxide is 400 (not 500) QD, and is OTC  gel caps which he prefers to prescribed tablets     Family History: Grandfather with CAD, heart attack 'early in life', unsure of age, died from MI age 38s. He has one son, age 60, who is healthy. He has a Engineer, maintenance (IT), born 06/2014.    Social History:   He's still living at home with his mother (age 59 years). 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 He  reports that he quit smoking about 29 years ago. He has a 20.00 pack-year smoking history. He has never used smokeless tobacco. He reports that he does not drink alcohol and does not use drugs.  Previously rare social drinking for special holiday.  No tobacco, or illicit drug use.    Review of Systems:  Rest of the review of systems is negative or unremarkable except as stated above. Physical Exam:  Vitals:    03/25/20 1300 03/25/20 1402   BP: 119/72 127/67   BP Site: L Arm R Arm   BP Position: Sitting Sitting   Pulse: 89 90   SpO2: 98%    Weight: (!) 108.4 kg (239 lb)    Height: 172.7 cm (5' 8)        Wt Readings from Last 12 Encounters:   03/25/20 (!) 108.4 kg (239 lb)   08/13/19 (!) 107.6 kg (237 lb 4.8 oz)   01/17/18 (!) 105.1 kg (231 lb 9.6 oz)   07/26/17 (!) 103.9 kg (229 lb)   02/01/17 (!) 105.3 kg (232 lb 3.2 oz)   08/24/16 (!) 101.6 kg (224 lb)   02/10/16 99.3 kg (218 lb 14.4 oz)   09/04/15 (!) 113.4 kg (250 lb)   01/07/15 (!) 115.7 kg (255 lb)   08/06/14 (!) 108.9 kg (240 lb)   02/12/14 (!) 109.6 kg (241 lb 9.6 oz)   07/27/13 100.7 kg (222 lb)    Body mass index is 36.34 kg/m??.    Constitutional: Good hygiene, well groomed, NAD.   HENT: Normocephalic and atraumatic.  Wearing mask, oropharynx not specifically examined  Neck: Supple without enlargements, no thyromegaly, bruit or JVD. No cervical or supraclavicular lymphadenopathy.   Cardiovascular: Nondisplaced PMI, normal S1, S2, no M/G/R. Normal carotid pulses without bruits. Normal peripheral pulses.   Lungs: Chest rise symmetric.  Clear to auscultation bilaterally, without wheezes/crackles/rhonchi. Good air movement.   Skin: No rashes/breakdowns   GI: abdomen round and soft, non-tender, and non-distended. no Hepatomegaly or masses. Mid-umbilical hernia noted, soft and reducible. BS+  Extremities: No edema dependently bilaterally.   Musculo Skeletal: No joint tenderness, deformity, effusions.  Psychiatry: Pleasant, talkative.  Neurological:   Nonfocal    Labs & Imaging:  Reviewed in EPIC.   Office Visit on 03/25/2020   Component Date Value Ref Range Status   ??? EKG Ventricular Rate 03/25/2020 88  BPM Final   ??? EKG Atrial Rate 03/25/2020 88  BPM Final   ??? EKG P-R Interval 03/25/2020 134  ms Final   ??? EKG QRS Duration 03/25/2020 82  ms Final   ??? EKG Q-T Interval 03/25/2020 368  ms Final   ??? EKG QTC Calculation 03/25/2020 445  ms Final ??? EKG Calculated P Axis 03/25/2020 24  degrees Final   ??? EKG Calculated R Axis 03/25/2020 12  degrees Final   ??? EKG Calculated T Axis 03/25/2020 11  degrees Final   ??? QTC Fredericia 03/25/2020 418  ms Final   ??? Vitamin D Total (  25OH) 03/25/2020 38.7  20.0 - 80.0 ng/mL Final   ??? Sodium 03/25/2020 137  135 - 145 mmol/L Final   ??? Potassium 03/25/2020 4.4  3.4 - 4.5 mmol/L Final   ??? Chloride 03/25/2020 108* 98 - 107 mmol/L Final   ??? CO2 03/25/2020 22.4  20.0 - 31.0 mmol/L Final   ??? Anion Gap 03/25/2020 7  5 - 14 mmol/L Final   ??? BUN 03/25/2020 21  9 - 23 mg/dL Final   ??? Creatinine 03/25/2020 1.27* 0.60 - 1.10 mg/dL Final   ??? BUN/Creatinine Ratio 03/25/2020 17   Final   ??? EGFR CKD-EPI Non-African American,* 03/25/2020 60  >=60 mL/min/1.59m2 Final   ??? EGFR CKD-EPI African American, Male 03/25/2020 69  >=60 mL/min/1.59m2 Final   ??? Glucose 03/25/2020 101  70 - 179 mg/dL Final   ??? Calcium 16/06/9603 10.3  8.7 - 10.4 mg/dL Final     EKG: tracing reviewed; see final report - NSR, normal intervals  Echo: images reviewed; see final report and above. 02/26/2020 0.7  10*9/L Final   ??? Absolute Eosinophils 02/26/2020 0.2  10*9/L Final   ??? Absolute Basophils 02/26/2020 0.1  10*9/L Final   ??? Tacrolimus, Trough 02/26/2020 6.0  ng/mL Final     EKG: tracing reviewed; see final report ***  Echo: images reviewed; see final report and above.

## 2020-03-25 ENCOUNTER — Ambulatory Visit
Admit: 2020-03-25 | Discharge: 2020-03-25 | Payer: MEDICARE | Attending: Cardiovascular Disease | Primary: Cardiovascular Disease

## 2020-03-25 ENCOUNTER — Ambulatory Visit: Admit: 2020-03-25 | Discharge: 2020-03-25 | Payer: MEDICARE

## 2020-03-25 DIAGNOSIS — I10 Essential (primary) hypertension: Principal | ICD-10-CM

## 2020-03-25 DIAGNOSIS — Z941 Heart transplant status: Principal | ICD-10-CM

## 2020-03-25 DIAGNOSIS — E559 Vitamin D deficiency, unspecified: Principal | ICD-10-CM

## 2020-03-25 DIAGNOSIS — R9431 Abnormal electrocardiogram [ECG] [EKG]: Secondary | ICD-10-CM | POA: Diagnosis not present

## 2020-03-25 DIAGNOSIS — N401 Enlarged prostate with lower urinary tract symptoms: Secondary | ICD-10-CM | POA: Diagnosis not present

## 2020-03-25 DIAGNOSIS — I517 Cardiomegaly: Secondary | ICD-10-CM | POA: Diagnosis not present

## 2020-03-25 DIAGNOSIS — E039 Hypothyroidism, unspecified: Secondary | ICD-10-CM | POA: Diagnosis not present

## 2020-03-25 DIAGNOSIS — E782 Mixed hyperlipidemia: Secondary | ICD-10-CM | POA: Diagnosis not present

## 2020-03-25 DIAGNOSIS — R350 Frequency of micturition: Secondary | ICD-10-CM | POA: Diagnosis not present

## 2020-03-25 DIAGNOSIS — E0800 Diabetes mellitus due to underlying condition with hyperosmolarity without nonketotic hyperglycemic-hyperosmolar coma (NKHHC): Secondary | ICD-10-CM | POA: Diagnosis not present

## 2020-03-25 LAB — BASIC METABOLIC PANEL
ANION GAP: 7 mmol/L (ref 5–14)
BLOOD UREA NITROGEN: 21 mg/dL (ref 9–23)
BUN / CREAT RATIO: 17
CALCIUM: 10.3 mg/dL (ref 8.7–10.4)
CHLORIDE: 108 mmol/L — ABNORMAL HIGH (ref 98–107)
CO2: 22.4 mmol/L (ref 20.0–31.0)
CREATININE: 1.27 mg/dL — ABNORMAL HIGH
EGFR CKD-EPI NON-AA MALE: 60 mL/min/{1.73_m2} (ref >=60)
GLUCOSE RANDOM: 101 mg/dL (ref 70–179)
POTASSIUM: 4.4 mmol/L (ref 3.4–4.5)
SODIUM: 137 mmol/L (ref 135–145)

## 2020-03-25 LAB — EGFR CKD-EPI AA MALE: Glomerular filtration rate/1.73 sq M.predicted.black:ArVRat:Pt:Ser/Plas/Bld:Qn:Creatinine-based formula (CKD-EPI): 69

## 2020-03-25 MED ORDER — TAMSULOSIN 0.4 MG CAPSULE
ORAL_CAPSULE | Freq: Every day | ORAL | 3 refills | 90.00000 days | Status: CP
Start: 2020-03-25 — End: 2021-03-25

## 2020-03-25 MED ORDER — MAGNESIUM OXIDE 400 MG (241.3 MG MAGNESIUM) TABLET
ORAL_TABLET | Freq: Two times a day (BID) | ORAL | 11 refills | 60.00000 days
Start: 2020-03-25 — End: 2021-03-25

## 2020-03-25 MED FILL — TACROLIMUS 0.5 MG CAPSULE, IMMEDIATE-RELEASE: 30 days supply | Qty: 90 | Fill #0 | Status: AC

## 2020-03-25 MED FILL — ATORVASTATIN 80 MG TABLET: ORAL | 90 days supply | Qty: 90 | Fill #1

## 2020-03-25 MED FILL — MYCOPHENOLATE MOFETIL 500 MG TABLET: ORAL | 30 days supply | Qty: 60 | Fill #7

## 2020-03-25 MED FILL — LEVOTHYROXINE 75 MCG TABLET: ORAL | 30 days supply | Qty: 30 | Fill #8

## 2020-03-25 MED FILL — LEVOTHYROXINE 75 MCG TABLET: 30 days supply | Qty: 30 | Fill #8 | Status: AC

## 2020-03-25 MED FILL — CHLORTHALIDONE 25 MG TABLET: ORAL | 30 days supply | Qty: 30 | Fill #8

## 2020-03-25 MED FILL — ATORVASTATIN 80 MG TABLET: 90 days supply | Qty: 90 | Fill #1 | Status: AC

## 2020-03-25 MED FILL — DILTIAZEM CD 180 MG CAPSULE,EXTENDED RELEASE 24 HR: 30 days supply | Qty: 60 | Fill #8 | Status: AC

## 2020-03-25 MED FILL — MYCOPHENOLATE MOFETIL 500 MG TABLET: 30 days supply | Qty: 60 | Fill #7 | Status: AC

## 2020-03-25 MED FILL — CHLORTHALIDONE 25 MG TABLET: 30 days supply | Qty: 30 | Fill #8 | Status: AC

## 2020-03-25 MED FILL — DILTIAZEM CD 180 MG CAPSULE,EXTENDED RELEASE 24 HR: ORAL | 30 days supply | Qty: 60 | Fill #8

## 2020-03-25 NOTE — Unmapped (Addendum)
-   Your echo looks good.     - We will repeat labs in 2 weeks after starting Flomax.      - The Flomax can improve your BP, as well. Continue to monitor at home, 1 hour after your AM medications.  Also, calibrate your BP machine with that at [facility] to see if they correlate.     - We will see you in Fall 2021 for Dobutamine Stress Echo    - We will see you in clinic in 1 year 2022.    - The only vaccine you need is the Shingrix.     Cheree Ditto, BSN, PCCN- Heart Transplant Coordinator  [facility] for [facility]  [address]  [place], [place] [address]  p [phone]- f [phone]

## 2020-03-27 LAB — VITAMIN D, TOTAL (25OH): Lab: 38.7

## 2020-04-02 LAB — HLA DS POST TRANSPLANT
ANTI-DONOR DRW #1 MFI: 225 MFI
ANTI-DONOR DRW #2 MFI: 438 MFI
ANTI-DONOR HLA-A #1 MFI: 103 MFI
ANTI-DONOR HLA-A #2 MFI: 42 MFI
ANTI-DONOR HLA-B #2 MFI: 357 MFI
ANTI-DONOR HLA-C #1 MFI: 281 MFI
ANTI-DONOR HLA-C #2 MFI: 399 MFI
ANTI-DONOR HLA-DQB #1 MFI: 236 MFI
ANTI-DONOR HLA-DQB #2 MFI: 94 MFI
ANTI-DONOR HLA-DR #1 MFI: 124 MFI

## 2020-04-02 LAB — FSAB CLASS 2 ANTIBODY SPECIFICITY: HLA CL2 AB RESULT: POSITIVE

## 2020-04-02 LAB — HLA CL1 ANTIBODY COMM: Lab: 0

## 2020-04-02 LAB — ANTI-DONOR HLA-A #2 MFI: Lab: 42

## 2020-04-02 LAB — HLA CL2 AB COMMENT: Lab: 0

## 2020-04-09 NOTE — Unmapped (Signed)
Sent patient a MyChart message regarding repeat lab work after recent clinic visit.     Patient report she has been feeling unwell lately and has lost 10 lbs since our visit with him on 03/25/2020. He reports diarrhea, fever with Tmax of 100.4, cough, being lightheaded and some muscle soreness. Also, reports loss of appetite.     He had a Covid test Monday and is awaiting results. IF he is negative have encouraged him to make appt with PCP locally. Asked him to notify me when he receives results from Covid screen.

## 2020-04-10 ENCOUNTER — Other Ambulatory Visit: Payer: Medicare HMO

## 2020-04-10 ENCOUNTER — Telehealth (INDEPENDENT_AMBULATORY_CARE_PROVIDER_SITE_OTHER): Payer: Medicare HMO | Admitting: Family Medicine

## 2020-04-10 DIAGNOSIS — R197 Diarrhea, unspecified: Secondary | ICD-10-CM

## 2020-04-10 DIAGNOSIS — D849 Immunodeficiency, unspecified: Secondary | ICD-10-CM

## 2020-04-10 MED ORDER — AZITHROMYCIN 500 MG PO TABS: 500.0000 mg | ORAL_TABLET | Freq: Every day | ORAL | 0 refills | Status: DC

## 2020-04-10 NOTE — Progress Notes (Addendum)
   [name] is a 64 y.o. male who presents today for a virtual office visit.  Assessment/Plan:  New/Acute Problems: Diarrhea Discussed limitations of in person evaluation.  Although patient has no red flag signs or symptoms he is at high risk due to immunocompromised state.  In light of this we will start empiric treatment with azithromycin 500 mg daily x3 days.  We will also check stool sample to rule out other potential causes.  He can use Imodium or Pepto-Bismol as needed.  He is likely dehydrated as well.  Encourage patient to stay well-hydrated.  Discussed reasons to return to care and seek emergent care.  Chronic Problems Addressed Today: Immunocompromised History of heart transplant Type 2 diabetes, controlled Patient on magnesium oxide which potentially could be contributing some to loose stools however he has been on this for quite a while I do not think there is a significant triggering factor.  He is immunocompromised as noted above which does increase his risk for atypical GI pathogens and serious infections.  Will start empiric antibiotics as above while we await stool sample.    Subjective:  HPI:  Symptoms started about a week ago. Has had some diarrhea, fatigue, lightheadedness, decreased appetite.  Thinks he is dehydrated.  No nausea or vomiting.  Mild abdominal pain.  Patient is immunocompromised and is on CellCept and tacrolimus due to history of heart transplant.  [place]  No known sick contacts.  No questionable food intake.  Has had low-grade fevers as well.  Occasional cough.  No specific treatments tried.  No melena or hematochezia.  He lost 10 pounds in the last week or so.       Objective/Observations  Physical Exam: Gen: NAD, resting comfortably Pulm: Normal work of breathing Neuro: Grossly normal, moves all extremities Psych: Normal affect and thought content  Virtual Visit via Video   I connected with [name] on  04/10/20 at  3:40 PM EDT by a video enabled telemedicine application and verified that I am speaking with the correct person using two identifiers. The limitations of evaluation and management by telemedicine and the availability of in person appointments were discussed. The patient expressed understanding and agreed to proceed.   Patient location: Home Provider location: [place] participating in the virtual visit: Myself and Patient     Algis Greenhouse. Jerline Pain, MD 04/10/2020 10:56 AM

## 2020-04-11 ENCOUNTER — Other Ambulatory Visit: Payer: Medicare HMO

## 2020-04-11 DIAGNOSIS — R197 Diarrhea, unspecified: Secondary | ICD-10-CM | POA: Diagnosis not present

## 2020-04-12 DIAGNOSIS — Z20822 Contact with and (suspected) exposure to covid-19: Secondary | ICD-10-CM | POA: Diagnosis not present

## 2020-04-14 ENCOUNTER — Encounter: Payer: Self-pay | Admitting: Family Medicine

## 2020-04-14 ENCOUNTER — Telehealth (INDEPENDENT_AMBULATORY_CARE_PROVIDER_SITE_OTHER): Payer: Medicare HMO | Admitting: Family Medicine

## 2020-04-14 VITALS — BP 141/69 | HR 95 | Temp 98.5°F | Ht 68.0 in | Wt 224.0 lb

## 2020-04-14 DIAGNOSIS — U071 COVID-19: Secondary | ICD-10-CM

## 2020-04-14 MED ORDER — ONDANSETRON 4 MG PO TBDP
4.0000 mg | ORAL_TABLET | Freq: Three times a day (TID) | ORAL | 0 refills | Status: DC | PRN
Start: 2020-04-14 — End: 2021-06-18

## 2020-04-14 NOTE — Progress Notes (Addendum)
Phone [phone] Virtual visit via Video note   Subjective:  Chief complaint: Chief Complaint  Patient presents with  . virtual  . positive covid   This visit type was conducted due to national recommendations for restrictions regarding the COVID-19 Pandemic (e.g. social distancing).  This format is felt to be most appropriate for this patient at this time balancing risks to patient and risks to population by having him in for in person visit.  No physical exam was performed (except for noted visual exam or audio findings with Telehealth visits).    Our team/I connected with [name] at 11:20 AM EDT by a video enabled telemedicine application ([email or web address] or caregility through epic) and verified that I am speaking with the correct person using two identifiers.  Location patient: [place] Location provider: [facility] office Persons participating in the virtual visit:  patient  Our team/I discussed the limitations of evaluation and management by telemedicine and the availability of in person appointments. In light of current covid-19 pandemic, patient also understands that we are trying to protect them by minimizing in office contact if at all possible.  The patient expressed consent for telemedicine visit and agreed to proceed. Patient understands insurance will be billed.   Past Medical History-  Patient Active Problem List   Diagnosis Date Noted  . Immunosuppressed status ([place]) 02/23/2019    Priority: High  . Heart transplanted ([place]) 06/19/2014    Priority: High  . Chronic systolic heart failure ([place]) 03/02/2009    Priority: High  . 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    Priority: High  . 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 Diabetes mellitus type II, controlled ([place]) 12/13/2008    Priority: High  . Coronary atherosclerosis 04/25/2007    Priority: High  . Chronic abdominal pain 12/27/2015    Priority: Medium  . BPH (benign  prostatic hypertrophy) 02/26/2014    Priority: Medium  . Hypothyroidism 05/14/2008    Priority: Medium  . Hypertension associated with diabetes ([place]) 05/14/2008    Priority: Medium  . ANEMIA, OTHER UNSPEC 04/03/2008    Priority: Medium  . Hyperlipidemia associated with type 2 diabetes mellitus ([place]) 04/25/2007    Priority: Medium  . GERD (gastroesophageal reflux disease) 06/19/2014    Priority: Low  . Osteoarthritis 06/19/2014    Priority: Low  . History of adenomatous polyp of colon 09/17/2013    Priority: Low  . Morbid obesity ([place]) 02/26/2009    Priority: Low    Medications- reviewed and updated Current Outpatient Medications  Medication Sig Dispense Refill  . amLODipine (NORVASC) 10 MG tablet Take 10 mg by mouth daily. Per [facility]    . aspirin EC 81 MG tablet Take 1 tablet (81 mg total) by mouth daily. 30 tablet 0  . atorvastatin (LIPITOR) 80 MG tablet Take 1 tablet (80 mg total) by mouth daily at 6 PM. 30 tablet 0  . chlorthalidone (HYGROTON) 25 MG tablet Take 1 tablet (25 mg total) by mouth daily. 30 tablet 0  . cholecalciferol (VITAMIN D) 1000 units tablet Take 3 tablets (3,000 Units total) by mouth daily. 90 tablet 0  . diltiazem (TIAZAC) 180 MG 24 hr capsule Take 2 capsules (360 mg total) by mouth daily. 60 capsule 0  . levothyroxine (SYNTHROID, LEVOTHROID) 75 MCG tablet Take 1 tablet (75 mcg total) by mouth daily before breakfast. 30 tablet 0  . lisinopril (PRINIVIL,ZESTRIL) 40 MG tablet Take 1 tablet (40 mg total) by mouth daily. [place]  tablet 0  . magnesium oxide (MAG-OX) 400 MG tablet Take 1.5 tablets (600 mg total) by mouth 2 (two) times daily. 45 tablet 0  . metFORMIN (GLUCOPHAGE) 1000 MG tablet TAKE 1 TABLET BY MOUTH TWICE DAILY WITH MEALS 180 tablet 0  . mycophenolate (CELLCEPT) 500 MG tablet Take 1 tablet (500 mg total) by mouth 2 (two) times daily. 60 tablet 0  . tacrolimus (PROGRAF) 0.5 MG capsule Take 2 capsules (1 mg total) by mouth 2 (two) times daily. 120 capsule 0   . valsartan (DIOVAN) 160 MG tablet     . ondansetron (ZOFRAN ODT) 4 MG disintegrating tablet Take 1 tablet (4 mg total) by mouth every 8 (eight) hours as needed for nausea or vomiting. 20 tablet 0   No current facility-administered medications for this visit.     Objective:  BP (!) 141/69   Pulse 95   Temp 98.5 F (36.9 C)   Ht 5\' 8"  (1.727 m)   Wt 224 lb (101.6 kg)   SpO2 97%   BMI 34.06 kg/m  self reported vitals Gen: NAD, appears fatigued Lungs: nonlabored, normal respiratory rate  Skin: appears dry, no obvious rash    Assessment and Plan   # Covid-19 Positive S: Pt is fully vaccinated with pfizer.  First day of symptoms was 28th of July. He got tested initially but test got last. Got tested again on Sunday and pt states he tested positive yesterday for covid. Reduced magnesium to 400 twice daily from 800 twice daily and less diarrhea with this change.   He c/o nausea, cough with creamy clear mucous, he denies fever today- perhaps slight subjective at times but never up to 100.5. He denies loss of taste and smell. He has body aches and diarrhea, his appetite is not existent and he is not drinking much fluid.   Was seen by Dr. Jerline Pain 04/10/20 placed on azithromycin for diarrhea- \  Mother could drive him. They have n95 masks they can use.   Home BPs mainly in 140s since having covid symptoms  No shorntess of breath.  A/P: Patient with testing confirming covid 19 with first day of covid 19 symptoms July 28th or 29th Therefore: - recommended patient watch closely for shortness of breath or confusion or worsening symptoms and if those occur he should contact us immediately or seek care in the emergency department -has pulse oximeter and if levels 94% or below persistently- seek care at the hospital - for quarantine if covid 19 needs to be at least 10 days since first symptom AND at least 24 hours fever free without fever reducing medications AND have improvement in respiratory  symptoms. With immunocompromised sate would recommend 21 day quarantine just to be on safe side.  - earliest possible day out of quarantine august 18th, recommendations for patient - we also discussed close contacts would need 14 day quarantine after last close contact with patient - would also recommend testing at least 3-5 days after exposure - past 10 days to consider potential for antibody treatment - really needs to push fluids- gave zofran to help with nausea and hoping he can drink more fluids (doing 2 bottles of water down from 4-5 a day) - he has contacted transplant center- no further workup at present  - contacted patient 8/10- symptoms stable to slightly better. zofran helping some  Recommended follow up: As needed for acute concerns No future appointments.  Lab/Order associations:   ICD-10-CM   1. COVID-19  U07.1  Meds ordered this encounter  Medications  . ondansetron (ZOFRAN ODT) 4 MG disintegrating tablet    Sig: Take 1 tablet (4 mg total) by mouth every 8 (eight) hours as needed for nausea or vomiting.    Dispense:  20 tablet    Refill:  0    Return precautions advised.  Garret Reddish, MD

## 2020-04-14 NOTE — Unmapped (Signed)
Received MyChart Notification from patient yesterday that he had tested positive for COVID.     He reports he began having a fever on 7/28, after [place] the zoo [place]. His T MAX was 100.2, but his fever has since resolved. He continues to experience nausea, diarrhea,fatigue.    He was initially tested for COVID on 7/30 at [facility] but his test was lost and he had to repeat the test. He repeated the test on 8/7 and received positive result on 8/8.    He had virtual visit with PCP today and his PCP was under impression he was outside the window to receive [facility] therapy.     Will confer with Dr Cherly Hensen and Dr Raylene Miyamoto and advise patient accordingly.

## 2020-04-14 NOTE — Unmapped (Signed)
Dr Raylene Miyamoto advised to verify of patient hypoxic, patient has O2 sat monitor at home and was 97% when we spoke.     Advised patient to call/page if he had worsening symptoms, including SOB/work of breathing. I recommended he check his O2 sat throughout the day and to call/page should he drop below 93%.    His PCP sent in medication for nausea, recommend he page should he experience emesis.     Andrew Dickerson verbalized understanding & agreed with the plan.

## 2020-04-15 LAB — GI PROFILE, STOOL, PCR

## 2020-04-15 NOTE — Progress Notes (Signed)
Please inform patient of the following:  Stool sample is negative. Would like for him to let us or his PCP know if his symptoms are not improving.  Algis Greenhouse. Jerline Pain, MD 04/15/2020 2:58 PM

## 2020-04-22 NOTE — Unmapped (Signed)
[facility] Clinical Assessment & Refill Coordination Note    Andrew Dickerson, DOB: 1956/06/08  Phone: [phone] (home)     All above HIPAA information was verified with patient.     Was a Nurse, learning disability used for this call? 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 amLODIPine (NORVASC) 10 MG tablet Take 1 tablet (10 mg total) by mouth daily. 90 tablet 3   ??? aspirin (ADULT LOW DOSE ASPIRIN) 81 MG tablet Take 1 tablet (81 mg total) by mouth daily. 90 tablet 3   ??? atorvastatin (LIPITOR) 80 MG tablet Take 1 tablet (80 mg total) by mouth daily. 90 tablet 3   ??? blood sugar diagnostic (ACCU-CHEK AVIVA PLUS TEST STRP) Strp CHECK BLOOD SUGAR BEFORE MEALS, AT BEDTIME, & AS NEEDED FOR HYPERGLYCEMIA UP TO 6 TIMES A DAY 200 strip 11   ??? chlorthalidone (HYGROTON) 25 MG tablet Take 1 tablet (25 mg total) by mouth daily. 30 tablet 11   ??? cholecalciferol, vitamin D3, 1,000 unit capsule Take 3,000 Units by mouth daily.     ??? diltiazem (CARDIZEM CD) 180 MG 24 hr capsule Take 2 capsules (360 mg total) by mouth daily. 60 capsule 11   ??? famotidine (PEPCID) 20 MG tablet Take 1 tablet (20 mg total) by mouth Two (2) times a day. 180 tablet 3   ??? levothyroxine (SYNTHROID) 75 MCG tablet Take 1 tablet (75 mcg total) by mouth daily. 30 tablet 11   ??? magnesium oxide (MAG-OX) 400 mg (241.3 mg elemental magnesium) tablet Take 1 tablet (400 mg total) by mouth Two (2) times a day. 120 tablet 11   ??? metFORMIN (GLUCOPHAGE) 1000 MG tablet Take 1,000 mg by mouth Two (2) times a day.     ??? mycophenolate (CELLCEPT) 500 mg tablet Take 1 tablet (500 mg total) by mouth Two (2) times a day. 180 tablet 3   ??? tacrolimus (PROGRAF) 0.5 MG capsule Take 2 capsules (1 mg total) by mouth daily AND 1 capsule (0.5 mg total) nightly. 90 capsule 11   ??? tamsulosin (FLOMAX) 0.4 mg capsule Take 1 capsule (0.4 mg total) by mouth daily. 90 capsule 3   ??? valsartan (DIOVAN) 160 MG tablet Take 2 tablets (320 mg total) by mouth daily. 180 tablet 3     Current Facility-Administered Medications   Medication Dose Route Frequency Provider Last Rate Last Admin   ??? magnesium oxide (MAG-OX) tablet 800 mg  800 mg Oral Once Liliane Shi, MD            Changes to medications: [name] reports no changes at this time.    No Known Allergies    Changes to allergies: No    SPECIALTY MEDICATION ADHERENCE     Mycophenolate 500mg   : 7 days of medicine on hand   Tacrolimus 0.5mg   : 7 days of medicine on hand     Medication Adherence    Patient reported X missed doses in the last month: 0  Specialty Medication: tacrolimus 0.5mg   Patient is on additional specialty medications: Yes  Additional Specialty Medications: Mycophenolate 500mg   Patient Reported Additional Medication X Missed Doses in the Last Month: 0          Specialty medication(s) dose(s) confirmed: Regimen is correct and unchanged.     Are there any concerns with adherence? No  Adherence counseling provided? Not needed    CLINICAL MANAGEMENT AND INTERVENTION      Clinical Benefit Assessment:    Do you feel the medicine is effective or helping your condition? Yes    Clinical Benefit counseling provided? Not needed    Adverse Effects Assessment:    Are you experiencing any side effects? No    Are you experiencing difficulty administering your medicine? No    Quality of Life Assessment:    How many days over the past month did your transplant  keep you from your normal activities? For example, brushing your teeth or getting up in the morning. 0    Have you discussed this with your provider? Not needed    Therapy Appropriateness:    Is therapy appropriate? Yes, therapy is appropriate and should be continued    DISEASE/MEDICATION-SPECIFIC INFORMATION      N/A    PATIENT SPECIFIC NEEDS     - Does the patient have any physical, cognitive, or cultural barriers? No    - Is the patient high risk? Yes, patient is taking a REMS drug. Medication is dispensed in compliance with REMS program    - Does the patient require a Care Management Plan? No     - Does the patient require physician intervention or other additional services (i.e. nutrition, smoking cessation, social work)? No      SHIPPING     Specialty Medication(s) to be Shipped:   Transplant: mycophenolate mofetil 500mg  and tacrolimus 0.5mg     Other medication(s) to be shipped: chlorthalidone, diltiazem, levothyroxine, valsartan     Changes to insurance: No    Delivery Scheduled: Yes, Expected medication delivery date: 04/25/2020.     Medication will be delivered via UPS to the confirmed prescription address in [facility].    The patient will receive a drug information handout for each medication shipped and additional FDA Medication Guides as required.  Verified that patient has previously received a Conservation officer, historic buildings.    All of the patient's questions and concerns have been addressed.    Thad Ranger   [facility] Specialty Pharmacist

## 2020-04-23 NOTE — Unmapped (Signed)
I called patient to follow up on how he is feeling, as he is recovering from COVID. He reports his energy continues to be low but he has noticed some improvement. He sometimes experiences pain in his nexk/back with movement and had some dizziness.     He denies DOE or SOB. When we spoke his O2 sats were 97%. He also says his local PCP has been calling him every other day to check in on him.     He needs lab work and I have sent him a lab slip he has not yet received. Will send another slip tomorrow. Asked patient to reach out should he need anything.   Andrew Dickerson verbalized understanding & agreed with the plan.

## 2020-04-24 MED FILL — LEVOTHYROXINE 75 MCG TABLET: 30 days supply | Qty: 30 | Fill #9 | Status: AC

## 2020-04-24 MED FILL — MYCOPHENOLATE MOFETIL 500 MG TABLET: 30 days supply | Qty: 60 | Fill #8 | Status: AC

## 2020-04-24 MED FILL — DILTIAZEM CD 180 MG CAPSULE,EXTENDED RELEASE 24 HR: ORAL | 30 days supply | Qty: 60 | Fill #9

## 2020-04-24 MED FILL — CHLORTHALIDONE 25 MG TABLET: 30 days supply | Qty: 30 | Fill #9 | Status: AC

## 2020-04-24 MED FILL — TACROLIMUS 0.5 MG CAPSULE, IMMEDIATE-RELEASE: ORAL | 30 days supply | Qty: 90 | Fill #1

## 2020-04-24 MED FILL — VALSARTAN 160 MG TABLET: 90 days supply | Qty: 180 | Fill #2 | Status: AC

## 2020-04-24 MED FILL — DILTIAZEM CD 180 MG CAPSULE,EXTENDED RELEASE 24 HR: 30 days supply | Qty: 60 | Fill #9 | Status: AC

## 2020-04-24 MED FILL — TACROLIMUS 0.5 MG CAPSULE, IMMEDIATE-RELEASE: 30 days supply | Qty: 90 | Fill #1 | Status: AC

## 2020-04-24 MED FILL — CHLORTHALIDONE 25 MG TABLET: ORAL | 30 days supply | Qty: 30 | Fill #9

## 2020-04-24 MED FILL — VALSARTAN 160 MG TABLET: ORAL | 90 days supply | Qty: 180 | Fill #2

## 2020-04-24 MED FILL — LEVOTHYROXINE 75 MCG TABLET: ORAL | 30 days supply | Qty: 30 | Fill #9

## 2020-04-24 MED FILL — MYCOPHENOLATE MOFETIL 500 MG TABLET: ORAL | 30 days supply | Qty: 60 | Fill #8

## 2020-05-01 ENCOUNTER — Other Ambulatory Visit: Payer: Self-pay | Admitting: Family Medicine

## 2020-05-01 DIAGNOSIS — R7309 Other abnormal glucose: Secondary | ICD-10-CM

## 2020-05-16 NOTE — Unmapped (Signed)
9/10: pt reports he had covid this past month, but is doing much better now -Massie Maroon    [facility] Clinical Assessment & Refill Coordination Note    PRANAY HILBUN, DOB: 05-02-1956  Phone: [phone] (home)     All above HIPAA information was verified with patient.     Was a Nurse, learning disability used for this call? 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 amLODIPine (NORVASC) 10 MG tablet Take 1 tablet (10 mg total) by mouth daily. 90 tablet 3   ??? aspirin (ADULT LOW DOSE ASPIRIN) 81 MG tablet Take 1 tablet (81 mg total) by mouth daily. 90 tablet 3   ??? atorvastatin (LIPITOR) 80 MG tablet Take 1 tablet (80 mg total) by mouth daily. 90 tablet 3   ??? blood sugar diagnostic (ACCU-CHEK AVIVA PLUS TEST STRP) Strp CHECK BLOOD SUGAR BEFORE MEALS, AT BEDTIME, & AS NEEDED FOR HYPERGLYCEMIA UP TO 6 TIMES A DAY 200 strip 11   ??? chlorthalidone (HYGROTON) 25 MG tablet Take 1 tablet (25 mg total) by mouth daily. 30 tablet 11   ??? cholecalciferol, vitamin D3, 1,000 unit capsule Take 3,000 Units by mouth daily.     ??? diltiazem (CARDIZEM CD) 180 MG 24 hr capsule Take 2 capsules (360 mg total) by mouth daily. 60 capsule 11   ??? famotidine (PEPCID) 20 MG tablet Take 1 tablet (20 mg total) by mouth Two (2) times a day. 180 tablet 3   ??? levothyroxine (SYNTHROID) 75 MCG tablet Take 1 tablet (75 mcg total) by mouth daily. 30 tablet 11   ??? magnesium oxide (MAG-OX) 400 mg (241.3 mg elemental magnesium) tablet Take 1 tablet (400 mg total) by mouth Two (2) times a day. 120 tablet 11   ??? metFORMIN (GLUCOPHAGE) 1000 MG tablet Take 1,000 mg by mouth Two (2) times a day.     ??? mycophenolate (CELLCEPT) 500 mg tablet Take 1 tablet (500 mg total) by mouth Two (2) times a day. 180 tablet 3   ??? tacrolimus (PROGRAF) 0.5 MG capsule Take 2 capsules (1 mg total) by mouth daily AND 1 capsule (0.5 mg total) nightly. 90 capsule 11   ??? tamsulosin (FLOMAX) 0.4 mg capsule Take 1 capsule (0.4 mg total) by mouth daily. 90 capsule 3   ??? valsartan (DIOVAN) 160 MG tablet Take 2 tablets (320 mg total) by mouth daily. 180 tablet 3     Current Facility-Administered Medications   Medication Dose Route Frequency Provider Last Rate Last Admin   ??? magnesium oxide (MAG-OX) tablet 800 mg  800 mg Oral Once Liliane Shi, MD            Changes to medications: Encarnacion reports no changes at this time.    No Known Allergies    Changes to allergies: No    SPECIALTY MEDICATION ADHERENCE     Mycophenolate 500mg   : 12 days of medicine on hand   Tacrolimus 0.5mg   : 12 days of medicine on hand     Medication Adherence    Patient reported X missed doses in the last month: 0  Specialty Medication: tacrolimus 0.5mg   Patient is on additional specialty medications: Yes  Additional Specialty Medications: Mycophenolate 500mg   Patient Reported Additional Medication X Missed Doses in the Last Month: 0          Specialty medication(s) dose(s) confirmed:  Regimen is correct and unchanged.     Are there any concerns with adherence? No    Adherence counseling provided? Not needed    CLINICAL MANAGEMENT AND INTERVENTION      Clinical Benefit Assessment:    Do you feel the medicine is effective or helping your condition? Yes    Clinical Benefit counseling provided? Not needed    Adverse Effects Assessment:    Are you experiencing any side effects? No    Are you experiencing difficulty administering your medicine? No    Quality of Life Assessment:    How many days over the past month did your transplant  keep you from your normal activities? For example, brushing your teeth or getting up in the morning. 0    Have you discussed this with your provider? Not needed    Therapy Appropriateness:    Is therapy appropriate? Yes, therapy is appropriate and should be continued    DISEASE/MEDICATION-SPECIFIC INFORMATION      N/A    PATIENT SPECIFIC NEEDS     - Does the patient have any physical, cognitive, or cultural barriers? No    - Is the patient high risk? Yes, patient is taking a REMS drug. Medication is dispensed in compliance with REMS program    - Does the patient require a Care Management Plan? No     - Does the patient require physician intervention or other additional services (i.e. nutrition, smoking cessation, social work)? No      SHIPPING     Specialty Medication(s) to be Shipped:   Transplant: mycophenolate mofetil 500mg  and tacrolimus 0.5mg     Other medication(s) to be shipped: chlorthalidone, diltiazem, levothyroxine, test strips, amlodipine, aspirin, famotidine     Changes to insurance: No    Delivery Scheduled: Yes, Expected medication delivery date: 05/23/2020.     Medication will be delivered via UPS to the confirmed prescription address in [facility].    The patient will receive a drug information handout for each medication shipped and additional FDA Medication Guides as required.  Verified that patient has previously received a Conservation officer, historic buildings.    All of the patient's questions and concerns have been addressed.    Thad Ranger   [facility] Specialty Pharmacist

## 2020-05-19 DIAGNOSIS — Z941 Heart transplant status: Secondary | ICD-10-CM | POA: Diagnosis not present

## 2020-05-19 DIAGNOSIS — Z48298 Encounter for aftercare following other organ transplant: Secondary | ICD-10-CM | POA: Diagnosis not present

## 2020-05-19 DIAGNOSIS — R7989 Other specified abnormal findings of blood chemistry: Secondary | ICD-10-CM | POA: Diagnosis not present

## 2020-05-19 DIAGNOSIS — Z79899 Other long term (current) drug therapy: Secondary | ICD-10-CM | POA: Diagnosis not present

## 2020-05-22 LAB — TACROLIMUS BLOOD: Lab: 7.4

## 2020-05-22 LAB — CBC W/ DIFFERENTIAL
BASOPHILS ABSOLUTE COUNT: 0.1 10*9/L
BASOPHILS RELATIVE PERCENT: 0.7 %
EOSINOPHILS ABSOLUTE COUNT: 0.2 10*9/L
EOSINOPHILS RELATIVE PERCENT: 0.8 %
HEMATOCRIT: 37.9 % — ABNORMAL LOW (ref 38.5–50.0)
HEMOGLOBIN: 12.4 g/dL — ABNORMAL LOW (ref 13.2–17.1)
LYMPHOCYTES ABSOLUTE COUNT: 1.8 10*9/L
LYMPHOCYTES RELATIVE PERCENT: 21.2 %
MEAN CORPUSCULAR HEMOGLOBIN CONC: 32.7 g/dL
MEAN CORPUSCULAR HEMOGLOBIN: 29.5 pg
MEAN CORPUSCULAR VOLUME: 90.2 fL
MEAN PLATELET VOLUME: 10.1 fL
MONOCYTES RELATIVE PERCENT: 6.5 %
NEUTROPHILS ABSOLUTE COUNT: 5.7 10*9/L
NEUTROPHILS RELATIVE PERCENT: 68.8 %
PLATELET COUNT: 271 10*9/L
RED BLOOD CELL COUNT: 4.2 10*12/L
RED CELL DISTRIBUTION WIDTH: 14.4 %
WHITE BLOOD CELL COUNT: 8.3 10*9/L

## 2020-05-22 LAB — EOSINOPHILS RELATIVE PERCENT: Lab: 0.8

## 2020-05-22 LAB — BASIC METABOLIC PANEL
CALCIUM: 9.9 mg/dL
CHLORIDE: 104 mmol/L
CREATININE: 1.28 mg/dL — ABNORMAL HIGH (ref 0.70–1.25)
EGFR CKD-EPI NON-AA MALE: 59 mL/min/{1.73_m2} — ABNORMAL LOW
GLUCOSE RANDOM: 120 mg/dL — ABNORMAL HIGH (ref 65–99)
SODIUM: 138 mmol/L

## 2020-05-22 LAB — MAGNESIUM: Lab: 1.4 — ABNORMAL LOW

## 2020-05-22 LAB — CREATININE: Lab: 1.28 — ABNORMAL HIGH

## 2020-05-22 MED FILL — DILTIAZEM CD 180 MG CAPSULE,EXTENDED RELEASE 24 HR: ORAL | 30 days supply | Qty: 60 | Fill #10

## 2020-05-22 MED FILL — AMLODIPINE 10 MG TABLET: 90 days supply | Qty: 90 | Fill #3 | Status: AC

## 2020-05-22 MED FILL — CHLORTHALIDONE 25 MG TABLET: 30 days supply | Qty: 30 | Fill #10 | Status: AC

## 2020-05-22 MED FILL — DILTIAZEM CD 180 MG CAPSULE,EXTENDED RELEASE 24 HR: 30 days supply | Qty: 60 | Fill #10 | Status: AC

## 2020-05-22 MED FILL — TACROLIMUS 0.5 MG CAPSULE, IMMEDIATE-RELEASE: ORAL | 30 days supply | Qty: 90 | Fill #2

## 2020-05-22 MED FILL — AMLODIPINE 10 MG TABLET: ORAL | 90 days supply | Qty: 90 | Fill #3

## 2020-05-22 MED FILL — TACROLIMUS 0.5 MG CAPSULE, IMMEDIATE-RELEASE: 30 days supply | Qty: 90 | Fill #2 | Status: AC

## 2020-05-22 MED FILL — LEVOTHYROXINE 75 MCG TABLET: 30 days supply | Qty: 30 | Fill #10 | Status: AC

## 2020-05-22 MED FILL — ASPIRIN 81 MG TABLET,DELAYED RELEASE: ORAL | 90 days supply | Qty: 90 | Fill #3

## 2020-05-22 MED FILL — MYCOPHENOLATE MOFETIL 500 MG TABLET: ORAL | 30 days supply | Qty: 60 | Fill #9

## 2020-05-22 MED FILL — MYCOPHENOLATE MOFETIL 500 MG TABLET: 30 days supply | Qty: 60 | Fill #9 | Status: AC

## 2020-05-22 MED FILL — CHLORTHALIDONE 25 MG TABLET: ORAL | 30 days supply | Qty: 30 | Fill #10

## 2020-05-22 MED FILL — FAMOTIDINE 20 MG TABLET: ORAL | 90 days supply | Qty: 180 | Fill #3

## 2020-05-22 MED FILL — FAMOTIDINE 20 MG TABLET: 90 days supply | Qty: 180 | Fill #3 | Status: AC

## 2020-05-22 MED FILL — ACCU-CHEK AVIVA PLUS TEST STRIPS: 33 days supply | Qty: 200 | Fill #2

## 2020-05-22 MED FILL — ACCU-CHEK AVIVA PLUS TEST STRIPS: 33 days supply | Qty: 200 | Fill #2 | Status: AC

## 2020-05-22 MED FILL — LEVOTHYROXINE 75 MCG TABLET: ORAL | 30 days supply | Qty: 30 | Fill #10

## 2020-05-22 MED FILL — ASPIRIN 81 MG TABLET,DELAYED RELEASE: 90 days supply | Qty: 90 | Fill #3 | Status: AC

## 2020-05-22 NOTE — Unmapped (Signed)
Discussed recent labs with Edgar Frisk, PharmD.  Plan is to Make No Changes  with repeat labs in 1 Month.  No change to current regimen, since patient is post covid will repeat in 1 month.   Sent patient MyChart message with labs and follow up plan      Lab Results   Component Value Date    TACROLIMUS 7.4 05/19/2020     Goal: Tac: 5-8  Current Dose: Tacrolimus 1 mg /0.5 mg    Lab Results   Component Value Date    BUN 22 05/19/2020    CREATININE 1.28 (H) 05/19/2020    K 4.8 05/19/2020    GLU 120 (H) 05/19/2020    MG 1.4 (L) 05/19/2020     Lab Results   Component Value Date    WBC 8.3 05/19/2020    HGB 12.4 (L) 05/19/2020    HCT 37.9 (L) 05/19/2020    PLT 271 05/19/2020    NEUTROABS 5.7 05/19/2020    EOSABS 0.2 05/19/2020

## 2020-06-10 DIAGNOSIS — E119 Type 2 diabetes mellitus without complications: Secondary | ICD-10-CM | POA: Diagnosis not present

## 2020-06-10 DIAGNOSIS — H40033 Anatomical narrow angle, bilateral: Secondary | ICD-10-CM | POA: Diagnosis not present

## 2020-06-12 NOTE — Unmapped (Signed)
[facility] Refill Coordination Note    Specialty Medication(s) to be Shipped:   Transplant: mycophenolate mofetil 500mg  and tacrolimus 0.5mg     Other medication(s) to be shipped: diltiazem 180mg ,Levothyroxine 89mcg,chlothalidone 25mg ,atorvastatin 80mg ,     Andrew Dickerson, DOB: 1956/04/22  Phone: [phone] (home)       All above HIPAA information was verified with patient.     Was a Nurse, learning disability used for this call? No    Completed refill call assessment today to schedule patient's medication shipment from the [facility] [phone]).       Specialty medication(s) and dose(s) confirmed: Regimen is correct and unchanged.   Changes to medications: [name] reports no changes at this time.  Changes to insurance: No  Questions for the pharmacist: No    Confirmed patient received Welcome Packet with first shipment. The patient will receive a drug information handout for each medication shipped and additional FDA Medication Guides as required.       DISEASE/MEDICATION-SPECIFIC INFORMATION        N/A    SPECIALTY MEDICATION ADHERENCE     Medication Adherence    Patient reported X missed doses in the last month: 0  Specialty Medication: tacrolimus 0.5mg   Patient is on additional specialty medications: Yes  Additional Specialty Medications: Mycophenolate 500mg   Patient Reported Additional Medication X Missed Doses in the Last Month: 0  Patient is on more than two specialty medications: No  Informant: patient  Reliability of informant: reliable  Patient is at risk for Non-Adherence: No                mycophenolate 500 mg: 10 days of medicine on hand   tacrolimus 0.5 mg: 10 days of medicine on hand         SHIPPING     Shipping address confirmed in Epic.     Delivery Scheduled: Yes, Expected medication delivery date: 10/13.     Medication will be delivered via UPS to the prescription address in Epic [facility].    [name]   [facility] Specialty Technician

## 2020-06-13 NOTE — Patient Instructions (Addendum)
Health Maintenance Due  Topic Date Due  . INFLUENZA VACCINE In office flu shot today 04/06/2020   - did recommend he go ahead and get the covid 19 booster immunizations. Do this 2-4 weeks after flu shot  - could schedule wellness visit with our nurse Otila Kluver by phone or video before you leave  - slowly reengage with exercise- even if walking 5-10 minutes on a daily basis- try to build by 10-20% each week to 30 minutes 5 days a week.   -message me a few days after bloodwork so I can review from [facility]

## 2020-06-13 NOTE — Progress Notes (Signed)
Phone [phone] In person visit   Subjective:   [name] is a 64 y.o. year old very pleasant male patient who presents for/with See problem oriented charting Chief Complaint  Patient presents with  . Covid Follow up   This visit occurred during the SARS-CoV-2 public health emergency.  Safety protocols were in place, including screening questions prior to the visit, additional usage of staff PPE, and extensive cleaning of exam room while observing appropriate contact time as indicated for disinfecting solutions.   Past Medical History-  Patient Active Problem List   Diagnosis Date Noted  . Immunosuppressed status ([place]) 02/23/2019    Priority: High  . Heart transplanted ([place]) 06/19/2014    Priority: High  . Chronic systolic heart failure ([place]) 03/02/2009    Priority: High  . 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    Priority: High  . 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 Diabetes mellitus type II, controlled ([place]) 12/13/2008    Priority: High  . Coronary atherosclerosis 04/25/2007    Priority: High  . Chronic abdominal pain 12/27/2015    Priority: Medium  . BPH (benign prostatic hypertrophy) 02/26/2014    Priority: Medium  . Hypothyroidism 05/14/2008    Priority: Medium  . Hypertension associated with diabetes ([place]) 05/14/2008    Priority: Medium  . ANEMIA, OTHER UNSPEC 04/03/2008    Priority: Medium  . Hyperlipidemia associated with type 2 diabetes mellitus ([place]) 04/25/2007    Priority: Medium  . GERD (gastroesophageal reflux disease) 06/19/2014    Priority: Low  . Osteoarthritis 06/19/2014    Priority: Low  . History of adenomatous polyp of colon 09/17/2013    Priority: Low    Medications- reviewed and updated Current Outpatient Medications  Medication Sig Dispense Refill  . amLODipine (NORVASC) 10 MG tablet Take 10 mg by mouth daily. Per [facility]    . aspirin EC 81 MG tablet Take 1 tablet (81 mg total) by mouth  daily. 30 tablet 0  . atorvastatin (LIPITOR) 80 MG tablet Take 1 tablet (80 mg total) by mouth daily at 6 PM. 30 tablet 0  . chlorthalidone (HYGROTON) 25 MG tablet Take 1 tablet (25 mg total) by mouth daily. 30 tablet 0  . cholecalciferol (VITAMIN D) 1000 units tablet Take 3 tablets (3,000 Units total) by mouth daily. 90 tablet 0  . diltiazem (TIAZAC) 180 MG 24 hr capsule Take 2 capsules (360 mg total) by mouth daily. 60 capsule 0  . famotidine (PEPCID) 20 MG tablet Take 20 mg by mouth in the morning and at bedtime.    [name] levothyroxine (SYNTHROID, LEVOTHROID) 75 MCG tablet Take 1 tablet (75 mcg total) by mouth daily before breakfast. 30 tablet 0  . magnesium oxide (MAG-OX) 400 MG tablet Take 1.5 tablets (600 mg total) by mouth 2 (two) times daily. 45 tablet 0  . metFORMIN (GLUCOPHAGE) 1000 MG tablet TAKE 1 TABLET BY MOUTH TWICE DAILY WITH MEALS 180 tablet 0  . mycophenolate (CELLCEPT) 500 MG tablet Take 1 tablet (500 mg total) by mouth 2 (two) times daily. 60 tablet 0  . tacrolimus (PROGRAF) 0.5 MG capsule Take 2 capsules (1 mg total) by mouth 2 (two) times daily. (Patient taking differently: Take by mouth 2 (two) times daily. 2 tablets in the AM 1 tablet in the PM) 120 capsule 0  . tamsulosin (FLOMAX) 0.4 MG CAPS capsule Take 0.4 mg by mouth daily.    . valsartan (DIOVAN) 160 MG tablet Take 320 mg by  mouth daily.     . ondansetron (ZOFRAN ODT) 4 MG disintegrating tablet Take 1 tablet (4 mg total) by mouth every 8 (eight) hours as needed for nausea or vomiting. 20 tablet 0   No current facility-administered medications for this visit.     Objective:  BP 130/70   Pulse 95   Temp 97.6 F (36.4 C) (Temporal)   Resp 18   Ht 5\' 8"  (1.727 m)   Wt 228 lb 6.4 oz (103.6 kg)   SpO2 99%   BMI 34.73 kg/m  Gen: NAD, resting comfortably CV: RRR no murmurs rubs or gallops Lungs: CTAB no crackles, wheeze, rhonchi Ext: trace edema Skin: warm, dry  Diabetic Foot Exam - Simple   Simple Foot  Form Diabetic Foot exam was performed with the following findings: Yes 06/16/2020 10:55 AM  Visual Inspection No deformities, no ulcerations, no other skin breakdown bilaterally: Yes Sensation Testing Intact to touch and monofilament testing bilaterally: Yes Pulse Check Posterior Tibialis and Dorsalis pulse intact bilaterally: Yes Comments Slightly decreased pulses but palpable. No claudication. Onychomycosis left 1st great toenail       Assessment and Plan  Covid F/U S: Patient is fully vaccinated from COVID-19.  Developed COVID-19 in July late in the month of 2021. Did not receive outpatient antibody infusion as time I saw him was too late to receive (beyond 10 days) A/P: seems he has fully recovered from covid-19. Thrilled considering his immunocompromised status with heart transplant/meds (tacrolimus, mycophenolate) - did recommend he go ahead and get the covid 19 booster immunizations perhaps 2-4 weeks after flu shot today   # Diabetes S: Medication:metformin 1000mg  BID  CBGs- average over last 10  Was 135. Rare over 180 Exercise and diet- not exercising lately. Fair on food choices- looks like down 13 lbs from last year which is encouraging but he states covid 19 was big driver of weight loss Lab Results  Component Value Date   HGBA1C 6.1 01/21/2020   HGBA1C 6.8 04/06/2019   HGBA1C 5.9 07/14/2018    A/P: hopefully controlled- he has labs coming up with [facility]- he will let me know a few days after his labs are drawn so I can review   #hypertension S: medication: lisinopril 40mg --> valsartan 160mg  by [facility], diltiazem 360mg  XR, amlodipine 10mg , chlorthalidone 25mg  Home readings #s: 156/87 his reading compared to 148/70 on my repeat. Seems to be getting different values- typically higher on home readings. Home readings often in 140s. BP Readings from Last 3 Encounters:  06/16/20 130/70  04/14/20 (!) 141/69  02/23/19 130/76  A/P: initial BP controlled here. Has follow up with [facility]-  they have bene tweaking meds. Home Bp has been somewhat high but home cuff appears higher than our readings- worth getting a few more data points. We will continue mes for now.   #hyperlipidemia/CAD S: Medication:atorvastatin 80mg    No chest pain. Not short of breath but has not been moving as quickly after covid Lab Results  Component Value Date   CHOL 134 01/27/2017   HDL 30 (A) 01/27/2017   LDLCALC 83 01/27/2017   LDLDIRECT 69 02/12/2014   TRIG 115 01/27/2017   CHOLHDL 5.2 CALC 03/05/2008   A/P: has upcoming labs at [facility]. LDL slightly high at 88 on may labs- will have team abstract these in  CAD- asymptomatic but has upcoming stress test  #hypothyroidism S: compliant On thyroid medication-levothyroxine 75 mcg  Lab Results  Component Value Date   TSH 1.20 08/11/2016   A/P:normal  in may- will have team abstract values   #BPH- tamsulosin has been somewhat helpful  # Obesity  S: not exercising. Weight loss from covid  Wt Readings from Last 3 Encounters:  06/16/20 228 lb 6.4 oz (103.6 kg)  04/14/20 224 lb (101.6 kg)  02/23/19 241 lb 9.6 oz (109.6 kg)  A/P: I encouraged him to start exercising again.  Has had some weight loss but unfortunately that was from COVID-19.  I will do want him to continue weight loss journey -Encouraged need for healthy eating, regular exercise (encouraged him to build himself up), weight loss.   #Heart failure with reduced ejection fraction S: Medication:valsartan. No recent lasix   Edema: trace Weight gain:none- has lost Shortness of breath: not sure- has not been as active  A/P: appears stable- continue to monitor.    Recommended follow up: Return in about 6 months (around 12/15/2020) for physical or sooner if needed.  Lab/Order associations:   ICD-10-CM   1. Obesity (BMI 30-39.9)  E66.9   2. Heart transplanted ([place]) Chronic Z94.1   3. Immunosuppressed status ([place]) Chronic D84.9   4. Chronic systolic heart failure (HCC) Chronic I50.22   5.  Hypertension associated with diabetes ([place]) Chronic E11.59    I15.2   6. Controlled type 2 diabetes mellitus without complication, without long-term current use of insulin (HCC)  E11.9     No orders of the defined types were placed in this encounter.    Return precautions advised.  Garret Reddish, MD

## 2020-06-16 ENCOUNTER — Encounter: Payer: Self-pay | Admitting: Family Medicine

## 2020-06-16 ENCOUNTER — Other Ambulatory Visit: Payer: Self-pay

## 2020-06-16 ENCOUNTER — Ambulatory Visit (INDEPENDENT_AMBULATORY_CARE_PROVIDER_SITE_OTHER): Payer: Medicare HMO | Admitting: Family Medicine

## 2020-06-16 VITALS — BP 130/70 | HR 95 | Temp 97.6°F | Resp 18 | Ht 68.0 in | Wt 228.4 lb

## 2020-06-16 DIAGNOSIS — Z941 Heart transplant status: Secondary | ICD-10-CM | POA: Diagnosis not present

## 2020-06-16 DIAGNOSIS — I5022 Chronic systolic (congestive) heart failure: Secondary | ICD-10-CM

## 2020-06-16 DIAGNOSIS — D849 Immunodeficiency, unspecified: Secondary | ICD-10-CM | POA: Diagnosis not present

## 2020-06-16 DIAGNOSIS — E669 Obesity, unspecified: Secondary | ICD-10-CM | POA: Diagnosis not present

## 2020-06-16 DIAGNOSIS — I152 Hypertension secondary to endocrine disorders: Secondary | ICD-10-CM

## 2020-06-16 DIAGNOSIS — Z23 Encounter for immunization: Secondary | ICD-10-CM | POA: Diagnosis not present

## 2020-06-16 DIAGNOSIS — E1159 Type 2 diabetes mellitus with other circulatory complications: Secondary | ICD-10-CM

## 2020-06-16 DIAGNOSIS — E119 Type 2 diabetes mellitus without complications: Secondary | ICD-10-CM

## 2020-06-16 NOTE — Addendum Note (Signed)
Addended by: Thomes Cake on: 06/16/2020 11:05 AM   Modules accepted: Orders

## 2020-06-17 MED FILL — DILTIAZEM CD 180 MG CAPSULE,EXTENDED RELEASE 24 HR: 30 days supply | Qty: 60 | Fill #11 | Status: AC

## 2020-06-17 MED FILL — LEVOTHYROXINE 75 MCG TABLET: ORAL | 30 days supply | Qty: 30 | Fill #11

## 2020-06-17 MED FILL — CHLORTHALIDONE 25 MG TABLET: 30 days supply | Qty: 30 | Fill #11 | Status: AC

## 2020-06-17 MED FILL — TACROLIMUS 0.5 MG CAPSULE, IMMEDIATE-RELEASE: ORAL | 30 days supply | Qty: 90 | Fill #3

## 2020-06-17 MED FILL — MYCOPHENOLATE MOFETIL 500 MG TABLET: 30 days supply | Qty: 60 | Fill #10 | Status: AC

## 2020-06-17 MED FILL — LEVOTHYROXINE 75 MCG TABLET: 30 days supply | Qty: 30 | Fill #11 | Status: AC

## 2020-06-17 MED FILL — MYCOPHENOLATE MOFETIL 500 MG TABLET: ORAL | 30 days supply | Qty: 60 | Fill #10

## 2020-06-17 MED FILL — CHLORTHALIDONE 25 MG TABLET: ORAL | 30 days supply | Qty: 30 | Fill #11

## 2020-06-17 MED FILL — DILTIAZEM CD 180 MG CAPSULE,EXTENDED RELEASE 24 HR: ORAL | 30 days supply | Qty: 60 | Fill #11

## 2020-06-17 MED FILL — ATORVASTATIN 80 MG TABLET: 90 days supply | Qty: 90 | Fill #2 | Status: AC

## 2020-06-17 MED FILL — TACROLIMUS 0.5 MG CAPSULE, IMMEDIATE-RELEASE: 30 days supply | Qty: 90 | Fill #3 | Status: AC

## 2020-06-17 MED FILL — ATORVASTATIN 80 MG TABLET: ORAL | 90 days supply | Qty: 90 | Fill #2

## 2020-06-20 LAB — HM DIABETES EYE EXAM

## 2020-06-30 DIAGNOSIS — E785 Hyperlipidemia, unspecified: Secondary | ICD-10-CM | POA: Diagnosis not present

## 2020-06-30 DIAGNOSIS — Z79899 Other long term (current) drug therapy: Secondary | ICD-10-CM | POA: Diagnosis not present

## 2020-06-30 DIAGNOSIS — Z941 Heart transplant status: Secondary | ICD-10-CM | POA: Diagnosis not present

## 2020-06-30 DIAGNOSIS — Z48298 Encounter for aftercare following other organ transplant: Secondary | ICD-10-CM | POA: Diagnosis not present

## 2020-06-30 DIAGNOSIS — E559 Vitamin D deficiency, unspecified: Secondary | ICD-10-CM | POA: Diagnosis not present

## 2020-06-30 DIAGNOSIS — R7989 Other specified abnormal findings of blood chemistry: Secondary | ICD-10-CM | POA: Diagnosis not present

## 2020-06-30 LAB — HEMOGLOBIN A1C: Hemoglobin A1C: 5.8

## 2020-07-09 NOTE — Unmapped (Signed)
Called [facility] to request lab results be faxed, Provided fax number. Per representative,lab orders had previously been faxed.     Will continue to follow up

## 2020-07-10 ENCOUNTER — Encounter: Payer: Self-pay | Admitting: Family Medicine

## 2020-07-10 LAB — CBC W/ DIFFERENTIAL
BASOPHILS ABSOLUTE COUNT: 0.06 {cells}/uL (ref 0–0.2)
BASOPHILS RELATIVE PERCENT: 0.7 %
EOSINOPHILS ABSOLUTE COUNT: 0.1 {cells}/uL (ref 0.01–0.5)
EOSINOPHILS RELATIVE PERCENT: 1.9 %
HEMATOCRIT: 40.9 % (ref 38.5–50.0)
HEMOGLOBIN: 13 g/dL — ABNORMAL LOW (ref 13.2–17.1)
LYMPHOCYTES ABSOLUTE COUNT: 1.59 {cells}/uL (ref 0.85–3.9)
MEAN CORPUSCULAR HEMOGLOBIN CONC: 31.8 g/dL — ABNORMAL LOW (ref 32.0–36.0)
MEAN CORPUSCULAR HEMOGLOBIN: 29 pg (ref 27.0–33.0)
MEAN CORPUSCULAR VOLUME: 91.3 fL (ref 80.0–100.0)
MEAN PLATELET VOLUME: 10.4 fL (ref 7.5–12.5)
MONOCYTES ABSOLUTE COUNT: 0.5 {cells}/uL (ref 0.02–0.95)
MONOCYTES RELATIVE PERCENT: 6.2 %
NEUTROPHILS ABSOLUTE COUNT: 6.97 {cells}/uL (ref 1.5–7.8)
NEUTROPHILS RELATIVE PERCENT: 74.2 %
PLATELET COUNT: 285 10*3/uL (ref 140–400)
RED CELL DISTRIBUTION WIDTH: 13.5 % (ref 11.0–15.0)
WHITE BLOOD CELL COUNT: 9.4 10*3/uL (ref 3.8–10.8)

## 2020-07-10 LAB — LIPID PANEL
CHOLESTEROL: 151 mg/dL
HDL CHOLESTEROL: 34 mg/dL — ABNORMAL LOW
LDL CHOLESTEROL CALCULATED: 95 mg/dL
TRIGLYCERIDES: 128 mg/dL

## 2020-07-10 LAB — RENAL FUNCTION PANEL
BUN / CREAT RATIO: 24 (calc) — ABNORMAL HIGH (ref 6–22)
CALCIUM: 10.3 mg/dL (ref 8.6–10.3)
CO2: 20 mmol/L (ref 20–32)
CREATININE: 1.3 mg/dL — ABNORMAL HIGH (ref 0.70–1.25)
EGFR MDRD NON AF AMER: 58 mL/min/{1.73_m2} — ABNORMAL LOW
GLUCOSE RANDOM: 134 mg/dL — ABNORMAL HIGH (ref 65–99)
POTASSIUM: 5.4 mmol/L — ABNORMAL HIGH (ref 3.5–5.3)
SODIUM: 136 mmol/L (ref 135–146)

## 2020-07-10 LAB — HEMOGLOBIN A1C: Lab: 5.8 — ABNORMAL HIGH

## 2020-07-10 LAB — MAGNESIUM: Lab: 1.7

## 2020-07-10 LAB — TACROLIMUS BLOOD: Lab: 6.4

## 2020-07-10 LAB — POTASSIUM: Lab: 5.4 — ABNORMAL HIGH

## 2020-07-10 LAB — CHOLESTEROL/HDL RATIO SCREEN: Lab: 4.4

## 2020-07-10 LAB — THYROID STIMULATING HORMONE: Lab: 1.13

## 2020-07-10 LAB — NEUTROPHILS ABSOLUTE COUNT: Lab: 6.97

## 2020-07-10 MED ORDER — LEVOTHYROXINE 75 MCG TABLET
ORAL_TABLET | Freq: Every day | ORAL | 11 refills | 30 days | Status: CP
Start: 2020-07-10 — End: 2021-07-10
  Filled 2020-07-15: qty 30, 30d supply, fill #0

## 2020-07-10 MED ORDER — DILTIAZEM CD 180 MG CAPSULE,EXTENDED RELEASE 24 HR
ORAL_CAPSULE | Freq: Every day | ORAL | 11 refills | 30 days | Status: CP
Start: 2020-07-10 — End: ?
  Filled 2020-07-15: qty 60, 30d supply, fill #0

## 2020-07-10 MED ORDER — CHLORTHALIDONE 25 MG TABLET
ORAL_TABLET | Freq: Every day | ORAL | 11 refills | 30 days | Status: CP
Start: 2020-07-10 — End: 2021-07-10
  Filled 2020-07-15: qty 30, 30d supply, fill #0

## 2020-07-10 NOTE — Unmapped (Signed)
[facility] Refill Coordination Note    Specialty Medication(s) to be Shipped:   Transplant: mycophenolate mofetil 500mg  and tacrolimus 0.5mg     Other medication(s) to be shipped: chlorthalidone 25mg , diltiazem 180mg , levothyroxine , valsartan 160mg      [name], DOB: [birth year]  Phone: [phone] (home)       All above HIPAA information was verified with patient.     Was a Nurse, learning disability used for this call? No    Completed refill call assessment today to schedule patient's medication shipment from the [facility] [phone]).       Specialty medication(s) and dose(s) confirmed: Regimen is correct and unchanged.   Changes to medications: Gaston reports no changes at this time.  Changes to insurance: No  Questions for the pharmacist: No    Confirmed patient received Welcome Packet with first shipment. The patient will receive a drug information handout for each medication shipped and additional FDA Medication Guides as required.       DISEASE/MEDICATION-SPECIFIC INFORMATION        N/A    SPECIALTY MEDICATION ADHERENCE     Medication Adherence    Patient reported X missed doses in the last month: 0  Specialty Medication: Mycophenolate 500mg   Patient is on additional specialty medications: Yes  Additional Specialty Medications: Tacrolimus 0.5mg   Patient Reported Additional Medication X Missed Doses in the Last Month: 0  Patient is on more than two specialty medications: No  Informant: patient                Mycophenolate 500 mg: 10 days of medicine on hand   Tacrolimus 0.5 mg: 10 days of medicine on hand         SHIPPING     Shipping address confirmed in Epic.     Delivery Scheduled: Yes, Expected medication delivery date: 07/16/20.     Medication will be delivered via UPS to the prescription address in Epic WAM.    Jasper Loser   [facility] Specialty Technician

## 2020-07-10 NOTE — Unmapped (Signed)
Discussed recent labs with Edgar Frisk, PharmD.  Plan is to Make No Changes with repeat labs in 1 Week.    Patient's LDL is elevated from prior draw,educated patient on dietary modifications. Patient K also elevated educated patient on high K foods.   Mr. Bagot verbalized understanding & agreed with the plan.        Lab Results   Component Value Date    TACROLIMUS 6.4 06/30/2020     Goal: Tac: 5-8  Current Dose: Tacrolimus 1 mg/0.5 mg    Lab Results   Component Value Date    BUN 31 (H) 06/30/2020    CREATININE 1.30 (H) 06/30/2020    K 5.4 (H) 06/30/2020    GLU 134 (H) 06/30/2020    MG 1.7 06/30/2020     Lab Results   Component Value Date    WBC 9.4 06/30/2020    HGB 13.0 (L) 06/30/2020    HCT 40.9 06/30/2020    PLT 285 06/30/2020    NEUTROABS 6.97 06/30/2020    EOSABS 0.1 06/30/2020

## 2020-07-15 MED FILL — TACROLIMUS 0.5 MG CAPSULE, IMMEDIATE-RELEASE: 30 days supply | Qty: 90 | Fill #4 | Status: AC

## 2020-07-15 MED FILL — CHLORTHALIDONE 25 MG TABLET: 30 days supply | Qty: 30 | Fill #0 | Status: AC

## 2020-07-15 MED FILL — MYCOPHENOLATE MOFETIL 500 MG TABLET: ORAL | 30 days supply | Qty: 60 | Fill #11

## 2020-07-15 MED FILL — DILTIAZEM CD 180 MG CAPSULE,EXTENDED RELEASE 24 HR: 30 days supply | Qty: 60 | Fill #0 | Status: AC

## 2020-07-15 MED FILL — TACROLIMUS 0.5 MG CAPSULE, IMMEDIATE-RELEASE: ORAL | 30 days supply | Qty: 90 | Fill #4

## 2020-07-15 MED FILL — VALSARTAN 160 MG TABLET: ORAL | 90 days supply | Qty: 180 | Fill #3

## 2020-07-15 MED FILL — LEVOTHYROXINE 75 MCG TABLET: 30 days supply | Qty: 30 | Fill #0 | Status: AC

## 2020-07-15 MED FILL — MYCOPHENOLATE MOFETIL 500 MG TABLET: 30 days supply | Qty: 60 | Fill #11 | Status: AC

## 2020-07-15 MED FILL — VALSARTAN 160 MG TABLET: 90 days supply | Qty: 180 | Fill #3 | Status: AC

## 2020-07-27 ENCOUNTER — Other Ambulatory Visit: Payer: Self-pay | Admitting: Family Medicine

## 2020-07-27 DIAGNOSIS — R7309 Other abnormal glucose: Secondary | ICD-10-CM

## 2020-07-28 DIAGNOSIS — Z48298 Encounter for aftercare following other organ transplant: Secondary | ICD-10-CM | POA: Diagnosis not present

## 2020-07-28 DIAGNOSIS — R7989 Other specified abnormal findings of blood chemistry: Secondary | ICD-10-CM | POA: Diagnosis not present

## 2020-07-28 DIAGNOSIS — Z941 Heart transplant status: Secondary | ICD-10-CM | POA: Diagnosis not present

## 2020-07-28 DIAGNOSIS — Z79899 Other long term (current) drug therapy: Secondary | ICD-10-CM | POA: Diagnosis not present

## 2020-07-28 MED ORDER — EZETIMIBE 10 MG PO TABS: 10.0000 mg | ORAL_TABLET | Freq: Every day | ORAL | 3 refills | Status: DC

## 2020-07-29 ENCOUNTER — Encounter: Payer: Self-pay | Admitting: Family Medicine

## 2020-08-04 LAB — CBC W/ DIFFERENTIAL
BASOPHILS ABSOLUTE COUNT: 0.05 {cells}/uL (ref 0–0.2)
BASOPHILS RELATIVE PERCENT: 0.6 %
EOSINOPHILS ABSOLUTE COUNT: 0.3 {cells}/uL (ref 0.01–0.5)
EOSINOPHILS RELATIVE PERCENT: 3.6 %
HEMATOCRIT: 38.9 % (ref 38.5–50.0)
HEMOGLOBIN: 13.1 g/dL — ABNORMAL LOW (ref 13.2–17.1)
LYMPHOCYTES ABSOLUTE COUNT: 1.3 {cells}/uL (ref 0.85–3.9)
LYMPHOCYTES RELATIVE PERCENT: 14.7 %
MEAN CORPUSCULAR HEMOGLOBIN CONC: 33.7 g/dL (ref 32.0–36.0)
MEAN CORPUSCULAR HEMOGLOBIN: 30 pg (ref 27.0–33.0)
MEAN CORPUSCULAR VOLUME: 89.2 fL (ref 80.0–100.0)
MEAN PLATELET VOLUME: 10.7 fL (ref 7.5–12.5)
MONOCYTES ABSOLUTE COUNT: 0.6 {cells}/uL (ref 0.2–0.95)
MONOCYTES RELATIVE PERCENT: 7.1 %
NEUTROPHILS ABSOLUTE COUNT: 6.4 {cells}/uL (ref 1.5–7.8)
NEUTROPHILS RELATIVE PERCENT: 74 %
PLATELET COUNT: 255 10*3/uL (ref 140–400)
RED BLOOD CELL COUNT: 4.36 10*6/uL (ref 4.20–5.80)
RED CELL DISTRIBUTION WIDTH: 13.1 % (ref 11.0–15.0)
WHITE BLOOD CELL COUNT: 8.6 10*3/uL (ref 3.8–10.8)

## 2020-08-04 LAB — RENAL FUNCTION PANEL
BLOOD UREA NITROGEN: 20 mg/dL (ref 7–25)
BUN / CREAT RATIO: 15 (calc) (ref 6–22)
CALCIUM: 9.9 mg/dL (ref 8.6–10.3)
CHLORIDE: 105 mmol/L (ref 98–110)
CO2: 21 mmol/L (ref 20–32)
CREATININE: 1.32 mg/dL — ABNORMAL HIGH (ref 0.70–1.25)
EGFR MDRD NON AF AMER: 57 mL/min/{1.73_m2} — ABNORMAL LOW
GLUCOSE RANDOM: 132 mg/dL (ref 65–139)
POTASSIUM: 4.8 mmol/L (ref 3.5–5.3)
SODIUM: 137 mmol/L (ref 135–146)

## 2020-08-04 LAB — MAGNESIUM: MAGNESIUM: 1.7 mg/dL (ref 1.5–2.5)

## 2020-08-04 LAB — TACROLIMUS LEVEL: TACROLIMUS BLOOD: 6.4 ug/L (ref 5.0–20.0)

## 2020-08-04 NOTE — Unmapped (Signed)
Discussed recent labs with Edgar Frisk, PharmD.  Plan is to Make No Changes  with repeat labs in 1 Month.    Sent patient MyChart message with lab results and follow up plan    Lab Results   Component Value Date    TACROLIMUS 6.4 07/28/2020     Goal: Tac: 5-8  Current Dose: Tacrolimus 1mg /0.5 mg    Lab Results   Component Value Date    BUN 20 07/28/2020    CREATININE 1.32 (H) 07/28/2020    K 4.8 07/28/2020    GLU 132 07/28/2020    MG 1.7 07/28/2020     Lab Results   Component Value Date    WBC 8.6 07/28/2020    HGB 13.1 (L) 07/28/2020    HCT 38.9 07/28/2020    PLT 255 07/28/2020    NEUTROABS 6.4 07/28/2020    EOSABS 0.3 07/28/2020

## 2020-08-04 NOTE — Unmapped (Signed)
Called [facility] for 2nd time to request lab results. Patient previously had elevated K level ( labs 10/25) and was asked to repeat in 1 week. Awaiting lab results. Will continue to follow up.

## 2020-08-07 DIAGNOSIS — Z941 Heart transplant status: Principal | ICD-10-CM

## 2020-08-07 MED ORDER — AMLODIPINE 10 MG TABLET
ORAL_TABLET | Freq: Every day | ORAL | 3 refills | 90 days
Start: 2020-08-07 — End: 2021-08-07

## 2020-08-07 MED ORDER — FAMOTIDINE 20 MG TABLET
ORAL_TABLET | Freq: Two times a day (BID) | ORAL | 3 refills | 90 days
Start: 2020-08-07 — End: 2021-08-07
  Filled 2020-08-18: qty 180, 90d supply, fill #0

## 2020-08-07 MED ORDER — ASPIRIN 81 MG TABLET,DELAYED RELEASE
ORAL_TABLET | Freq: Every day | ORAL | 3 refills | 90.00000 days
Start: 2020-08-07 — End: ?

## 2020-08-07 NOTE — Unmapped (Signed)
[facility] Refill Coordination Note    Specialty Medication(s) to be Shipped:   Transplant: mycophenolate mofetil 500mg  and tacrolimus 0.5mg     Other medication(s) to be shipped:      Chlorthalidone  Diltiazem  Levothyroxine       Andrew Dickerson, DOB: 1956/03/02  Phone: [phone] (home)       All above HIPAA information was verified with patient.     Was a Nurse, learning disability used for this call? No    Completed refill call assessment today to schedule patient's medication shipment from the [facility] [phone]).       Specialty medication(s) and dose(s) confirmed: Regimen is correct and unchanged.   Changes to medications: Andrew Dickerson reports no changes at this time.  Changes to insurance: No  Questions for the pharmacist: No    Confirmed patient received Welcome Packet with first shipment. The patient will receive a drug information handout for each medication shipped and additional FDA Medication Guides as required.       DISEASE/MEDICATION-SPECIFIC INFORMATION        N/A    SPECIALTY MEDICATION ADHERENCE     Medication Adherence    Patient reported X missed doses in the last month: 0        mycophenolate mofetil 500mg : 10 days worth of medication on hand.  tacrolimus 0.5mg : 10 days worth of medication on hand.        SHIPPING     Shipping address confirmed in Epic.     Delivery Scheduled: Yes, Expected medication delivery date: 08/12/20.     Medication will be delivered via UPS to the prescription address in Epic WAM.    Andrew Dickerson   [facility] Specialty Technician

## 2020-08-08 MED ORDER — MYCOPHENOLATE MOFETIL 500 MG TABLET
ORAL_TABLET | Freq: Two times a day (BID) | ORAL | 3 refills | 90 days | Status: CP
Start: 2020-08-08 — End: 2021-08-08
  Filled 2020-08-11: qty 60, 30d supply, fill #0

## 2020-08-11 MED ORDER — AMLODIPINE 10 MG TABLET
ORAL_TABLET | Freq: Every day | ORAL | 3 refills | 90 days | Status: CP
Start: 2020-08-11 — End: 2021-08-11
  Filled 2020-08-18: qty 90, 90d supply, fill #0

## 2020-08-11 MED ORDER — ASPIRIN 81 MG TABLET,DELAYED RELEASE
ORAL_TABLET | Freq: Every day | ORAL | 3 refills | 90 days | Status: CP
Start: 2020-08-11 — End: ?
  Filled 2020-08-18: qty 90, 90d supply, fill #0

## 2020-08-11 MED FILL — CHLORTHALIDONE 25 MG TABLET: ORAL | 30 days supply | Qty: 30 | Fill #1

## 2020-08-11 MED FILL — LEVOTHYROXINE 75 MCG TABLET: ORAL | 30 days supply | Qty: 30 | Fill #1

## 2020-08-11 MED FILL — LEVOTHYROXINE 75 MCG TABLET: 30 days supply | Qty: 30 | Fill #1 | Status: AC

## 2020-08-11 MED FILL — MYCOPHENOLATE MOFETIL 500 MG TABLET: 30 days supply | Qty: 60 | Fill #0 | Status: AC

## 2020-08-11 MED FILL — DILTIAZEM CD 180 MG CAPSULE,EXTENDED RELEASE 24 HR: ORAL | 30 days supply | Qty: 60 | Fill #1

## 2020-08-11 MED FILL — CHLORTHALIDONE 25 MG TABLET: 30 days supply | Qty: 30 | Fill #1 | Status: AC

## 2020-08-11 MED FILL — DILTIAZEM CD 180 MG CAPSULE,EXTENDED RELEASE 24 HR: 30 days supply | Qty: 60 | Fill #1 | Status: AC

## 2020-08-11 MED FILL — TACROLIMUS 0.5 MG CAPSULE, IMMEDIATE-RELEASE: ORAL | 30 days supply | Qty: 90 | Fill #5

## 2020-08-11 MED FILL — TACROLIMUS 0.5 MG CAPSULE, IMMEDIATE-RELEASE: 30 days supply | Qty: 90 | Fill #5 | Status: AC

## 2020-08-18 MED FILL — ASPIRIN 81 MG TABLET,DELAYED RELEASE: 90 days supply | Qty: 90 | Fill #0 | Status: AC

## 2020-08-18 MED FILL — ACCU-CHEK AVIVA PLUS TEST STRIPS: 33 days supply | Qty: 200 | Fill #3

## 2020-08-18 MED FILL — ACCU-CHEK AVIVA PLUS TEST STRIPS: 33 days supply | Qty: 200 | Fill #3 | Status: AC

## 2020-08-18 MED FILL — FAMOTIDINE 20 MG TABLET: 90 days supply | Qty: 180 | Fill #0 | Status: AC

## 2020-08-18 MED FILL — AMLODIPINE 10 MG TABLET: 90 days supply | Qty: 90 | Fill #0 | Status: AC

## 2020-08-20 ENCOUNTER — Telehealth: Payer: Self-pay | Admitting: Family Medicine

## 2020-08-20 NOTE — Progress Notes (Signed)
  Chronic Care Management   Outreach Note  08/20/2020 Name: BRADLY SANGIOVANNI MRN: [id number] DOB: 1956-02-22  Referred by: Marin Olp, MD Reason for referral : No chief complaint on file.   An unsuccessful telephone outreach was attempted today. The patient was referred to the pharmacist for assistance with care management and care coordination.   Follow Up Plan:   Lauretta Grill Upstream Scheduler

## 2020-08-22 ENCOUNTER — Telehealth: Payer: Self-pay | Admitting: Family Medicine

## 2020-08-22 NOTE — Chronic Care Management (AMB) (Signed)
  Chronic Care Management   Note  08/22/2020 Name: [name] MRN: [id number] DOB: [birth year]  [name] is a 64 y.o. year old male who is a primary care patient of Marin Olp, MD. I reached out to [name] by phone today in response to a referral sent by Mr. Yuval Rubens Doom's PCP, Marin Olp, MD.   [name] was given information about Chronic Care Management services today including:  1. CCM service includes personalized support from designated clinical staff supervised by his physician, including individualized plan of care and coordination with other care providers 2. 24/7 contact phone numbers for assistance for urgent and routine care needs. 3. Service will only be billed when office clinical staff spend 20 minutes or more in a month to coordinate care. 4. Only one practitioner may furnish and bill the service in a calendar month. 5. The patient may stop CCM services at any time (effective at the end of the month) by phone call to the office staff.   Patient agreed to services and verbal consent obtained.   Follow up plan:   Lauretta Grill Upstream Scheduler

## 2020-09-10 NOTE — Unmapped (Signed)
[facility] Refill Coordination Note    Specialty Medication(s) to be Shipped:   Transplant: mycophenolate mofetil 500mg  and tacrolimus 0.5mg     Other medication(s) to be shipped:      Chlorthalidone  Diltiazem  Levothyroxine  Atorvastatin        Andrew Dickerson, DOB: [birth year]  Phone: [phone] (home)       All above HIPAA information was verified with patient.     Was a Nurse, learning disability used for this call? No    Completed refill call assessment today to schedule patient's medication shipment from the [facility] [phone]).       Specialty medication(s) and dose(s) confirmed: Regimen is correct and unchanged.   Changes to medications: Kastin reports no changes at this time.  Changes to insurance: No  Questions for the pharmacist: No    Confirmed patient received Welcome Packet with first shipment. The patient will receive a drug information handout for each medication shipped and additional FDA Medication Guides as required.       DISEASE/MEDICATION-SPECIFIC INFORMATION        N/A    SPECIALTY MEDICATION ADHERENCE     Medication Adherence    Patient reported X missed doses in the last month: 0        mycophenolate mofetil 500mg : 7 days worth of medication on hand.    tacrolimus 0.5mg : 7 days worth of medication on hand.              SHIPPING     Shipping address confirmed in Epic.     Delivery Scheduled: Yes, Expected medication delivery date: 09/12/20.     Medication will be delivered via UPS to the prescription address in Epic WAM.    Andrew Dickerson   [facility] Specialty Technician

## 2020-09-11 MED FILL — LEVOTHYROXINE 75 MCG TABLET: ORAL | 30 days supply | Qty: 30 | Fill #2

## 2020-09-11 MED FILL — LEVOTHYROXINE 75 MCG TABLET: 30 days supply | Qty: 30 | Fill #2 | Status: AC

## 2020-09-11 MED FILL — CHLORTHALIDONE 25 MG TABLET: 30 days supply | Qty: 30 | Fill #2 | Status: AC

## 2020-09-11 MED FILL — DILTIAZEM CD 180 MG CAPSULE,EXTENDED RELEASE 24 HR: 30 days supply | Qty: 60 | Fill #2 | Status: AC

## 2020-09-11 MED FILL — CHLORTHALIDONE 25 MG TABLET: ORAL | 30 days supply | Qty: 30 | Fill #2

## 2020-09-11 MED FILL — MYCOPHENOLATE MOFETIL 500 MG TABLET: ORAL | 30 days supply | Qty: 60 | Fill #1

## 2020-09-11 MED FILL — ATORVASTATIN 80 MG TABLET: 90 days supply | Qty: 90 | Fill #3 | Status: AC

## 2020-09-11 MED FILL — MYCOPHENOLATE MOFETIL 500 MG TABLET: 30 days supply | Qty: 60 | Fill #1 | Status: AC

## 2020-09-11 MED FILL — DILTIAZEM CD 180 MG CAPSULE,EXTENDED RELEASE 24 HR: ORAL | 30 days supply | Qty: 60 | Fill #2

## 2020-09-11 MED FILL — TACROLIMUS 0.5 MG CAPSULE, IMMEDIATE-RELEASE: 30 days supply | Qty: 90 | Fill #6 | Status: AC

## 2020-09-11 MED FILL — TACROLIMUS 0.5 MG CAPSULE, IMMEDIATE-RELEASE: ORAL | 30 days supply | Qty: 90 | Fill #6

## 2020-09-11 MED FILL — ATORVASTATIN 80 MG TABLET: ORAL | 90 days supply | Qty: 90 | Fill #3

## 2020-09-17 DIAGNOSIS — Z941 Heart transplant status: Principal | ICD-10-CM

## 2020-09-17 DIAGNOSIS — Z79899 Other long term (current) drug therapy: Secondary | ICD-10-CM | POA: Diagnosis not present

## 2020-09-17 DIAGNOSIS — R7989 Other specified abnormal findings of blood chemistry: Secondary | ICD-10-CM | POA: Diagnosis not present

## 2020-09-17 DIAGNOSIS — E559 Vitamin D deficiency, unspecified: Secondary | ICD-10-CM | POA: Diagnosis not present

## 2020-09-17 DIAGNOSIS — Z48298 Encounter for aftercare following other organ transplant: Secondary | ICD-10-CM | POA: Diagnosis not present

## 2020-09-18 ENCOUNTER — Telehealth: Payer: Self-pay

## 2020-09-18 NOTE — Chronic Care Management (AMB) (Signed)
Chronic Care Management Pharmacy Assistant   Name: [name]  MRN: [id number] DOB: 04-Jun-1956  Reason for Encounter: Medication Review/Initial Visit   [name],  65 y.o. , male presents for their Initial CCM visit with the clinical pharmacist via telephone.  PCP : Kevin Olp, MD  Allergies:  No Known Allergies  Medications: Outpatient Encounter Medications as of 09/18/2020  Medication Sig  . amLODipine (NORVASC) 10 MG tablet Take 10 mg by mouth daily. Per [facility]  . aspirin EC 81 MG tablet Take 1 tablet (81 mg total) by mouth daily.  [name] atorvastatin (LIPITOR) 80 MG tablet Take 1 tablet (80 mg total) by mouth daily at 6 PM.  . chlorthalidone (HYGROTON) 25 MG tablet Take 1 tablet (25 mg total) by mouth daily.  . cholecalciferol (VITAMIN D) 1000 units tablet Take 3 tablets (3,000 Units total) by mouth daily.  [name] diltiazem (TIAZAC) 180 MG 24 hr capsule Take 2 capsules (360 mg total) by mouth daily.  [name] ezetimibe (ZETIA) 10 MG tablet Take 1 tablet (10 mg total) by mouth daily.  . famotidine (PEPCID) 20 MG tablet Take 20 mg by mouth in the morning and at bedtime.  [name] levothyroxine (SYNTHROID, LEVOTHROID) 75 MCG tablet Take 1 tablet (75 mcg total) by mouth daily before breakfast.  . magnesium oxide (MAG-OX) 400 MG tablet Take 1.5 tablets (600 mg total) by mouth 2 (two) times daily.  . metFORMIN (GLUCOPHAGE) 1000 MG tablet TAKE 1 TABLET BY MOUTH TWICE DAILY WITH MEALS  . mycophenolate (CELLCEPT) 500 MG tablet Take 1 tablet (500 mg total) by mouth 2 (two) times daily.  . ondansetron (ZOFRAN ODT) 4 MG disintegrating tablet Take 1 tablet (4 mg total) by mouth every 8 (eight) hours as needed for nausea or vomiting.  . tacrolimus (PROGRAF) 0.5 MG capsule Take 2 capsules (1 mg total) by mouth 2 (two) times daily. (Patient taking differently: Take by mouth 2 (two) times daily. 2 tablets in the AM 1 tablet in the PM)  . tamsulosin (FLOMAX) 0.4 MG CAPS capsule Take 0.4 mg by mouth  daily.  . valsartan (DIOVAN) 160 MG tablet Take 320 mg by mouth daily.    No facility-administered encounter medications on file as of 09/18/2020.    Current Diagnosis: Patient Active Problem List   Diagnosis Date Noted  . Immunosuppressed status ([place]) 02/23/2019  . Chronic abdominal pain 12/27/2015  . Heart transplanted ([place]) 06/19/2014  . GERD (gastroesophageal reflux disease) 06/19/2014  . Osteoarthritis 06/19/2014  . BPH (benign prostatic hypertrophy) 02/26/2014  . History of adenomatous polyp of colon 09/17/2013  . Chronic systolic heart failure ([place]) 03/02/2009  . 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  . 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 Diabetes mellitus type II, controlled ([place]) 12/13/2008  . Hypothyroidism 05/14/2008  . Hypertension associated with diabetes ([place]) 05/14/2008  . ANEMIA, OTHER UNSPEC 04/03/2008  . Hyperlipidemia associated with type 2 diabetes mellitus ([place]) 04/25/2007  . Coronary atherosclerosis 04/25/2007    Have you seen any other providers since your last visit?   Patient states he has not seen any other providers since his last visit with his PCP Kevin Reddish, MD)  Any changes in your medications or health?  Patient states he has not had any changes in his medications or health.  Any side effects from any medications?   Patient denies having any side effects from his medications at this time.  Do you have any symptoms or problems not managed by your medications?  Patient states he does not have any symptoms or problems that are not managed by his medications.  Any concerns about your health right now?  Patient states he does not have any concerns about his health at this time.  Has your provider asked that you check blood pressure, blood sugar, or follow special diet at home?  Patient states he checks both his blood pressure and blood sugar every morning. Patient states he checks his blood sugar at least 1-2 times a  day, sometimes up to 3-4 times a day. Patient reports his blood pressure today 09/18/2020 at 151/68. Patient reports his blood glucose today 09/18/2020 at 127. Patient also reported his weight from today 09/18/2020 at 224.8lb.  Do you get any type of exercise on a regular basis?  Patient states he does not exercise anymore due to the current pandemic. Patient states he used to go to the gym until they shut down and has not started going back yet.  Can you think of a goal you would like to reach for your health?  Patient states he would like to lose some weight as a goal for his health.  Do you have any problems getting your medications?  Patient states he does not currently have any problems getting his medications.  Is there anything that you would like to discuss during the appointment?   Patient states he would like to discuss his "many medications".  Please make sure you have your medications and supplements available during your appointment.  April D Calhoun, [place] Pharmacist Assistant [phone]   Follow-Up:  Pharmacist Review

## 2020-09-19 NOTE — Unmapped (Signed)
Patient returned my call while I was on the other line. I called him back and left another message.

## 2020-09-19 NOTE — Unmapped (Signed)
I called and left a detailed message asking the patient to return my call to discuss his follow up appointment. Will continue to follow up.

## 2020-09-23 ENCOUNTER — Ambulatory Visit: Payer: Medicare HMO

## 2020-09-23 DIAGNOSIS — E119 Type 2 diabetes mellitus without complications: Secondary | ICD-10-CM

## 2020-09-23 DIAGNOSIS — E1159 Type 2 diabetes mellitus with other circulatory complications: Secondary | ICD-10-CM

## 2020-09-23 DIAGNOSIS — I152 Hypertension secondary to endocrine disorders: Secondary | ICD-10-CM

## 2020-09-23 DIAGNOSIS — E1169 Type 2 diabetes mellitus with other specified complication: Secondary | ICD-10-CM

## 2020-09-23 LAB — CBC W/ DIFFERENTIAL
BASOPHILS ABSOLUTE COUNT: 0 {cells}/uL (ref 0–0.2)
BASOPHILS RELATIVE PERCENT: 0.6 %
EOSINOPHILS ABSOLUTE COUNT: 0.3 {cells}/uL (ref 0.015–0.5)
EOSINOPHILS RELATIVE PERCENT: 2.8 %
HEMATOCRIT: 39.6 % (ref 38.5–50.0)
HEMOGLOBIN: 13.3 g/dL (ref 13.2–17.1)
LYMPHOCYTES ABSOLUTE COUNT: 1.7 {cells}/uL (ref 0.85–3.9)
LYMPHOCYTES RELATIVE PERCENT: 16.7 %
MEAN CORPUSCULAR HEMOGLOBIN CONC: 33.6 g/dL (ref 32.0–36.0)
MEAN CORPUSCULAR HEMOGLOBIN: 29.9 pg (ref 27.0–33.0)
MEAN CORPUSCULAR VOLUME: 89 fL (ref 80.0–100.0)
MEAN PLATELET VOLUME: 10.4 fL (ref 7.5–12.5)
MONOCYTES ABSOLUTE COUNT: 0.6 {cells}/uL (ref 0.2–0.95)
MONOCYTES RELATIVE PERCENT: 5.8 %
NEUTROPHILS ABSOLUTE COUNT: 7.6 {cells}/uL (ref 1.5–7.8)
NEUTROPHILS RELATIVE PERCENT: 74.1 %
PLATELET COUNT: 301 10*3/uL (ref 140–400)
RED BLOOD CELL COUNT: 4.45 10*6/uL (ref 4.20–5.80)
RED CELL DISTRIBUTION WIDTH: 12.6 % (ref 11.0–15.0)
WHITE BLOOD CELL COUNT: 10.3 10*3/uL (ref 3.8–10.8)

## 2020-09-23 LAB — COMPREHENSIVE METABOLIC PANEL
ALBUMIN/GLOBULIN RATIO: 1.8 (calc) (ref 1.0–2.5)
ALBUMIN: 4.6 g/dL (ref 3.6–5.1)
ALKALINE PHOSPHATASE: 89 U/L (ref 35–144)
ALT (SGPT): 15 U/L (ref 9–46)
AST (SGOT): 11 U/L (ref 10–35)
BILIRUBIN TOTAL: 0.7 mg/dL (ref 0.2–1.2)
BLOOD UREA NITROGEN: 25 mg/dL (ref 7–25)
BUN / CREAT RATIO: 19 (calc) (ref 6–22)
CALCIUM: 9.9 mg/dL (ref 8.6–10.3)
CHLORIDE: 103 mmol/L (ref 98–110)
CO2: 20 mmol/L (ref 20–32)
CREATININE: 1.34 mg/dL — ABNORMAL HIGH (ref 0.70–1.25)
EGFR CKD-EPI NON-AA MALE: 56 mL/min/{1.73_m2} — ABNORMAL LOW
GLOBULIN, TOTAL: 2.5 g/dL (ref 1.9–3.7)
GLUCOSE RANDOM: 125 mg/dL — ABNORMAL HIGH (ref 65–99)
POTASSIUM: 4.8 mmol/L (ref 3.5–5.3)
PROTEIN TOTAL: 7.1 g/dL (ref 6.1–8.1)
SODIUM: 136 mmol/L (ref 135–146)

## 2020-09-23 LAB — VITAMIN D 1,25 DIHYDROXY: VITAMIN D, TOTAL (25OH): 53 ng/mL

## 2020-09-23 LAB — TACROLIMUS LEVEL: TACROLIMUS BLOOD: 5.7 ug/L (ref 5.0–20.0)

## 2020-09-23 LAB — MAGNESIUM: MAGNESIUM: 1.8 mg/dL (ref 1.5–2.5)

## 2020-09-23 NOTE — Progress Notes (Signed)
Chronic Care Management Pharmacy Name: [name]     MRN: [id number]     DOB: November 30, 1955  Chief Complaint/ HPI [name], 65 y.o., male, presents for their initial CCM visit with the clinical pharmacist via telephone due to COVID-19 pandemic.  PCP: Marin Olp, MD Encounter Diagnoses  Name Primary?  . Hypertension associated with diabetes ([place]) Yes  . Controlled type 2 diabetes mellitus without complication, without long-term current use of insulin ([place])   . Hyperlipidemia associated with type 2 diabetes mellitus [facility])     Patient Active Problem List   Diagnosis Date Noted  . Immunosuppressed status ([place]) 02/23/2019  . Chronic abdominal pain 12/27/2015  . Heart transplanted ([place]) 06/19/2014  . GERD (gastroesophageal reflux disease) 06/19/2014  . Osteoarthritis 06/19/2014  . BPH (benign prostatic hypertrophy) 02/26/2014  . History of adenomatous polyp of colon 09/17/2013  . Chronic systolic heart failure ([place]) 03/02/2009  . 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  . 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 Diabetes mellitus type II, controlled ([place]) 12/13/2008  . Hypothyroidism 05/14/2008  . Hypertension associated with diabetes ([place]) 05/14/2008  . ANEMIA, OTHER UNSPEC 04/03/2008  . Hyperlipidemia associated with type 2 diabetes mellitus ([place]) 04/25/2007  . Coronary atherosclerosis 04/25/2007   Past Surgical History:  Procedure Laterality Date  . ANGIOPLASTY     with stent  . CARDIAC ASSIST DEVICE REMOVAL    . CARDIAC CATHETERIZATION    . COLONOSCOPY    . CORONARY ARTERY BYPASS GRAFT    . 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  . LEFT VENTRICULAR ASSIST DEVICE  05/2009   [facility]   Family History  Problem Relation Age of Onset  . Heart disease Mother   . Lung cancer Maternal Grandfather   . Lung cancer Maternal Grandmother   . Colon cancer Neg Hx   . Esophageal cancer Neg Hx   . Prostate cancer Neg Hx   . Rectal cancer Neg Hx   . Stomach  cancer Neg Hx    Social History   Social History Narrative   Single. Divorced. 1 son. 1 granddaughter 2014.       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       Hobbies: time on CPU            No Known Allergies Outpatient Encounter Medications as of 09/23/2020  Medication Sig  . amLODipine (NORVASC) 10 MG tablet Take 10 mg by mouth daily. Per [facility]  . aspirin EC 81 MG tablet Take 1 tablet (81 mg total) by mouth daily.  [name] atorvastatin (LIPITOR) 80 MG tablet Take 1 tablet (80 mg total) by mouth daily at 6 PM.  . chlorthalidone (HYGROTON) 25 MG tablet Take 1 tablet (25 mg total) by mouth daily.  . cholecalciferol (VITAMIN D) 1000 units tablet Take 3 tablets (3,000 Units total) by mouth daily.  [name] diltiazem (TIAZAC) 180 MG 24 hr capsule Take 2 capsules (360 mg total) by mouth daily.  [name] ezetimibe (ZETIA) 10 MG tablet Take 1 tablet (10 mg total) by mouth daily.  . famotidine (PEPCID) 20 MG tablet Take 20 mg by mouth in the morning and at bedtime.  [name] levothyroxine (SYNTHROID, LEVOTHROID) 75 MCG tablet Take 1 tablet (75 mcg total) by mouth daily before breakfast.  . magnesium oxide (MAG-OX) 400 MG tablet Take 1.5 tablets (600 mg total) by mouth 2 (two) times daily.  . metFORMIN (GLUCOPHAGE) 1000 MG tablet TAKE 1 TABLET BY MOUTH TWICE DAILY WITH MEALS  .  mycophenolate (CELLCEPT) 500 MG tablet Take 1 tablet (500 mg total) by mouth 2 (two) times daily.  . ondansetron (ZOFRAN ODT) 4 MG disintegrating tablet Take 1 tablet (4 mg total) by mouth every 8 (eight) hours as needed for nausea or vomiting.  . tacrolimus (PROGRAF) 0.5 MG capsule Take 2 capsules (1 mg total) by mouth 2 (two) times daily. (Patient taking differently: Take by mouth 2 (two) times daily. 2 tablets in the AM 1 tablet in the PM)  . tamsulosin (FLOMAX) 0.4 MG CAPS capsule Take 0.4 mg by mouth daily.  . valsartan (DIOVAN) 160 MG tablet Take 320 mg by mouth daily.    No facility-administered encounter  medications on file as of 09/23/2020.   Patient Care Team    Relationship Specialty Notifications Start End  Marin Olp, MD PCP - General Family Medicine  06/19/14    Comment: Sherin Quarry, Ella Bodo, MD Consulting Physician Cardiology  09/01/19   Madelin Rear, [facility]  Pharmacist  08/22/20   Madelin Rear, [facility] Pharmacist Pharmacist  08/22/20    Current Diagnosis/Assessment: Goals Addressed   None    Hypertension   BP goal <130/80  BP Readings from Last 3 Encounters:  06/16/20 130/70  04/14/20 (!) 141/69  02/23/19 130/76    BMP Latest Ref Rng & Units 01/23/2016 01/07/2016 12/30/2015  Glucose 70 - 99 mg/dL 80 113(H) 166(H)  BUN 6 - 23 mg/dL $Remove'19 21 16  'KxKPDri$ Creatinine 0.40 - 1.50 mg/dL 1.15 1.41 1.05  Sodium 135 - 145 mEq/L 136 136 134(L)  Potassium 3.5 - 5.1 mEq/L 4.7 4.3 3.3(L)  Chloride 96 - 112 mEq/L 105 101 99(L)  CO2 19 - 32 mEq/L $Remove'23 25 22  'vWEsJIP$ Calcium 8.4 - 10.5 mg/dL 9.3 9.7 9.2  Previous medications: enalapril, valsartan, carvedilol.  Patient checks BP at home daily. Recent home readings: 131/171 (09/23/2020 - after BP medications), 151/68 (09/18/2020 - before BP medications). Does not typically follow a diet. Half of a peanut butter sandwich, salad for lunch, vegetable soup for dinner.  Cheese for snacks. Nutter butters for snacks. Does add some salt to meals.  Denies any recent dizziness. Patient is currently at goal on the following medications:  . Amlodipine 10 mg once daily  . Chlorthalidone 25 mg once daily . Diltiazem 180 mg once daily  . Valsartan 160 mg tablet - 2 tablets (320 mg) once daily   We discussed diet/exercise - Maintain a healthy weight and exercise regularly, as directed by your health care provider. Eat healthy foods, such as: Lean proteins, complex carbohydrates, fresh fruits and vegetables, low-fat dairy products, healthy fats.  Plan  Continue current medications.  Diabetes   A1c goal < 6.5%  Lab Results  Component Value Date/Time   HGBA1C  5.8 06/30/2020 12:00 AM   HGBA1C 6.1 01/21/2020 12:00 AM   HGBA1C 6.8 04/06/2019 12:00 AM   HGBA1C 5.9 07/14/2018 12:00 AM   HGBA1C 5.7 01/23/2018 12:00 AM   GFR 69.16 01/23/2016 03:27 PM   GFR 54.67 (L) 01/07/2016 09:57 AM  Previous medications: insulin glargine.  Testing BG multiple times per day. Trying to avoid sweets. Recent FBG readings: 127, 120. Using "Lose It" app to track food, liquids, weight.   Feels metformin is tolerated very well. Patient is currently at goal on: [name] Metformin 1000 mg twice daily with meals   We discussed DM plate method  Plan  Continue current medications.  Hyperlipidemia   LDL goal < 70  Lipid Panel     Component  Value Date/Time   CHOL 145 01/21/2020 0000   CHOL 134 01/27/2017 0000   CHOL 135 08/11/2016 0000   TRIG 183 (A) 01/21/2020 0000   TRIG 115 01/27/2017 0000   TRIG 109 08/11/2016 0000   HDL 29 (A) 01/21/2020 0000   HDL 30 (A) 01/27/2017 0000   HDL 31 (A) 08/11/2016 0000   LDLCALC 88 01/21/2020 0000   LDLCALC 83 01/27/2017 0000   LDLCALC 83 08/11/2016 0000   LDLDIRECT 69 02/12/2014 0000    Hepatic Function Latest Ref Rng & Units 12/29/2015 12/26/2015 04/21/2009  Total Protein 6.5 - 8.1 g/dL 6.0(L) 7.6 6.6  Albumin 3.5 - 5.0 g/dL 2.6(L) 3.0(L) 3.0(L)  AST 15 - 41 U/L 9(L) 7(L) 19  ALT 17 - 63 U/L 9(L) 10(L) 22  Alk Phosphatase 38 - 126 U/L 79 103 62  Total Bilirubin 0.3 - 1.2 mg/dL 0.5 1.9(H) 1.4(H)  Bilirubin, Direct 0.1 - 0.5 mg/dL <0.1(L) - -    The 10-year ASCVD risk score Mikey Bussing DC Jr., et al., 2013) is: 24.2%   Values used to calculate the score:     Age: 29 years     Sex: Male     Is Non-Hispanic African American: No     Diabetic: Yes     Tobacco smoker: No     Systolic Blood Pressure: 010 mmHg     Is BP treated: Yes     HDL Cholesterol: 34 mg/dL     Total Cholesterol: 151 mg/dL   Has not had [facility] at [facility] since starting Zetia.  Patient previously not at goal the following medications:  . Zetia 10 mg once  daily . Atorvastatin 80 mg once daily   Reviewed side effects - no problems noted.   Plan  Continue current medications.  Hypothyroidism   Lab Results  Component Value Date   TSH 1.45 01/21/2020   TSH 1.20 08/11/2016   TSH 1.58 02/12/2014   TSH is currently within range on the following medications:  . Levothyroxine 75 mcg once daily  We discussed proper administration first thing in AM 30 minutes before other medications and meals.   Plan  Continue current medications.  GERD   Lab Results  Component Value Date   [id number] 347 04/03/2008   Previous medications: omeprazole.  Reports some ongoing recent acid reflux, attributes to eating late at night. Currently on: [name] Famotidine 20 mg twice daily   We discussed: Avoidance of potential triggers such as alcohol, fatty foods, lying down after eating, and tomato sauce.   Plan   Continue current medications.  Vaccines   Immunization History  Administered Date(s) Administered  . Hepatitis A, Adult 06/09/2012  . Hepatitis B 09/14/2011  . Hepatitis B, adult 05/08/2009, 06/09/2012  . HiB (PRP-OMP) 05/02/2009  . Influenza Split 07/02/2011  . Influenza Whole 06/25/2008  . Influenza, Seasonal, Injecte, Preservative Fre 08/06/2010, 06/09/2012  . Influenza,inj,Quad PF,6+ Mos 08/07/2013, 06/19/2014, 08/29/2015, 08/20/2016, 06/07/2018, 05/22/2019, 06/16/2020  . Influenza-Unspecified 06/19/2014  . PFIZER(Purple Top)SARS-COV-2 Vaccination 11/04/2019, 12/01/2019  . PPD Test 05/04/2009, 09/14/2011, 06/09/2012  . Pneumococcal Conjugate-13 07/18/2012  . Pneumococcal Polysaccharide-23 09/06/2009, 06/09/2012, 02/23/2019  . Pneumococcal-Unspecified 05/02/2009  . Td 05/02/2009  . Tdap 09/14/2011   Reviewed and discussed patient's vaccination history.    Plan  Recommended patient receive covid booster, Shingrix Vaccine.   Medication Management / Care Coordination   Receives prescription medications from:  [place] ([place]), [place] [address] [place] ([place])  [place] [address] Phone: [phone] Fax: [phone]   Denies any issues with medication management. Also using shared services through [facility] and [employer].  Plan  Continue current medication management strategy. ___________________________ SDOH (Social Determinants of Health) assessments performed: Yes. Future Appointments  Date Time Provider [place]  12/17/2020 10:40 AM Marin Olp, MD [facility]  04/01/2021  1:30 PM [facility] CCM PHARMACIST [facility]   Visit follow-up:  . CPA follow-up: 4 month DM call. [name] [place] follow-up: 6 month telephone f/u.  Madelin Rear, Pharm.D., BCGP Clinical Pharmacist [facility] [phone]

## 2020-09-24 NOTE — Unmapped (Signed)
Discussed recent labs with Dr. Cherly Hensen.  Plan is to Make No Changes  with repeat labs in 3 Months.  Asked patient to call and schedule his annual testing, as it appears they have attempted to reach him.    Mr. Amrhein verbalized understanding & agreed with the plan.        Lab Results   Component Value Date    TACROLIMUS 5.7 09/17/2020     Goal: Tac: 5-8  Current Dose: Tacrolimus 1 mg/0.5 mg    Lab Results   Component Value Date    BUN 25 09/17/2020    CREATININE 1.34 (H) 09/17/2020    K 4.8 09/17/2020    GLU 125 (H) 09/17/2020    MG 1.8 09/17/2020     Lab Results   Component Value Date    WBC 10.3 09/17/2020    HGB 13.3 09/17/2020    HCT 39.6 09/17/2020    PLT 301 09/17/2020    NEUTROABS 7.6 09/17/2020    EOSABS 0.3 09/17/2020

## 2020-09-25 NOTE — Patient Instructions (Addendum)
[name],  Thank you for taking the time to review your medications with me today.  I have included our care plan/goals in the following pages. Please review and call me at [phone] with any questions!  Thanks! Ellin Mayhew, Pharm.D., BCGP Clinical Pharmacist [facility] [phone]  Goals Addressed            This Visit's Progress   . PharmD Care Plan       CARE PLAN ENTRY (see longitudinal plan of care for additional care plan information)  Current Barriers:  . Chronic Disease Management support, education, and care coordination needs related to Hypertension, Hyperlipidemia, and Diabetes   Hypertension BP Readings from Last 3 Encounters:  06/16/20 130/70  04/14/20 (!) 141/69  02/23/19 130/76   . Pharmacist Clinical Goal(s): o Over the next 365 days, patient will work with PharmD and providers to maintain BP goal <130/80 . Current regimen:  . Amlodipine 10 mg once daily  . Chlorthalidone 25 mg once daily . Diltiazem 180 mg once daily  . Valsartan 160 mg tablet - 2 tablets (320 mg) once daily  . Interventions: o Reviewed home monitoring recommendations o Reviewed diet and exercise - Maintain a healthy weight and exercise regularly, as directed by your health care provider. Eat healthy foods, such as: Lean proteins, complex carbohydrates, fresh fruits and vegetables, low-fat dairy products, healthy fats. . Patient self care activities - Over the next 365 days, patient will: o Check BP at least once every 1-2 weeks, document, and provide at future appointments o Ensure daily salt intake < 2300 mg/day  Hyperlipidemia Lab Results  Component Value Date/Time   LDLCALC 88 01/21/2020 12:00 AM   LDLDIRECT 69 02/12/2014 12:00 AM   . Pharmacist Clinical Goal(s): o Over the next 365 days, patient will work with PharmD and providers to maintain LDL goal < 70 . Current regimen:  . Zetia 10 mg once daily . Atorvastatin 80 mg once  daily  . Interventions: o Reviewed side effects/tolerability - none noted at this time . Patient self care activities - Over the next 365 days, patient will: o Continue current management  Diabetes Lab Results  Component Value Date/Time   HGBA1C 5.8 06/30/2020 12:00 AM   HGBA1C 6.1 01/21/2020 12:00 AM   . Pharmacist Clinical Goal(s): o Over the next 365 days, patient will work with PharmD and providers to maintain A1c goal <7% . Current regimen:  . Metformin 1000 mg twice daily  . Interventions: o Reviewed plate method for DM diet. . Patient self care activities - Over the next 365 days, patient will: o Check blood sugar once daily, document, and provide at future appointments  Medication management . Pharmacist Clinical Goal(s): o Over the next 365 days, patient will work with PharmD and providers to maintain optimal medication adherence . Current pharmacy: [facility] . Interventions o Comprehensive medication review performed. o Continue current medication management strategy. . Patient self care activities - Over the next 365 days, patient will: o Take medications as prescribed o Report any questions or concerns to PharmD and/or provider(s) Initial goal documentation.      The patient verbalized understanding of instructions provided today and agreed to receive a mailed copy of patient instruction and/or educational materials. Telephone follow up appointment with pharmacy team member scheduled for: See next appointment with "Care Management Staff" under "What's Next" below.   Hypertension, Adult High blood pressure (hypertension) is when the force  of blood pumping through the arteries is too strong. The arteries are the blood vessels that carry blood from the heart throughout the body. Hypertension forces the heart to work harder to pump blood and may cause arteries to become narrow or stiff. Untreated or uncontrolled hypertension can cause a heart attack, heart  failure, a stroke, kidney disease, and other problems. A blood pressure reading consists of a higher number over a lower number. Ideally, your blood pressure should be below 120/80. The first ("top") number is called the systolic pressure. It is a measure of the pressure in your arteries as your heart beats. The second ("bottom") number is called the diastolic pressure. It is a measure of the pressure in your arteries as the heart relaxes. What are the causes? The exact cause of this condition is not known. There are some conditions that result in or are related to high blood pressure. What increases the risk? Some risk factors for high blood pressure are under your control. The following factors may make you more likely to develop this condition:  Smoking.  Having type 2 diabetes mellitus, high cholesterol, or both.  Not getting enough exercise or physical activity.  Being overweight.  Having too much fat, sugar, calories, or salt (sodium) in your diet.  Drinking too much alcohol. Some risk factors for high blood pressure may be difficult or impossible to change. Some of these factors include:  Having chronic kidney disease.  Having a family history of high blood pressure.  Age. Risk increases with age.  Race. You may be at higher risk if you are African American.  Gender. Men are at higher risk than women before age 50. After age 78, women are at higher risk than men.  Having obstructive sleep apnea.  Stress. What are the signs or symptoms? High blood pressure may not cause symptoms. Very high blood pressure (hypertensive crisis) may cause:  Headache.  Anxiety.  Shortness of breath.  Nosebleed.  Nausea and vomiting.  Vision changes.  Severe chest pain.  Seizures. How is this diagnosed? This condition is diagnosed by measuring your blood pressure while you are seated, with your arm resting on a flat surface, your legs uncrossed, and your feet flat on the floor.  The cuff of the blood pressure monitor will be placed directly against the skin of your upper arm at the level of your heart. It should be measured at least twice using the same arm. Certain conditions can cause a difference in blood pressure between your right and left arms. Certain factors can cause blood pressure readings to be lower or higher than normal for a short period of time:  When your blood pressure is higher when you are in a health care provider's office than when you are at home, this is called white coat hypertension. Most people with this condition do not need medicines.  When your blood pressure is higher at home than when you are in a health care provider's office, this is called masked hypertension. Most people with this condition may need medicines to control blood pressure. If you have a high blood pressure reading during one visit or you have normal blood pressure with other risk factors, you may be asked to:  Return on a different day to have your blood pressure checked again.  Monitor your blood pressure at home for 1 week or longer. If you are diagnosed with hypertension, you may have other blood or imaging tests to help your health care provider understand  your overall risk for other conditions. How is this treated? This condition is treated by making healthy lifestyle changes, such as eating healthy foods, exercising more, and reducing your alcohol intake. Your health care provider may prescribe medicine if lifestyle changes are not enough to get your blood pressure under control, and if:  Your systolic blood pressure is above 130.  Your diastolic blood pressure is above 80. Your personal target blood pressure may vary depending on your medical conditions, your age, and other factors. Follow these instructions at home: Eating and drinking  Eat a diet that is high in fiber and potassium, and low in sodium, added sugar, and fat. An example eating plan is called the  DASH (Dietary Approaches to Stop Hypertension) diet. To eat this way: ? Eat plenty of fresh fruits and vegetables. Try to fill one half of your plate at each meal with fruits and vegetables. ? Eat whole grains, such as whole-wheat pasta, brown rice, or whole-grain bread. Fill about one fourth of your plate with whole grains. ? Eat or drink low-fat dairy products, such as skim milk or low-fat yogurt. ? Avoid fatty cuts of meat, processed or cured meats, and poultry with skin. Fill about one fourth of your plate with lean proteins, such as fish, chicken without skin, beans, eggs, or tofu. ? Avoid pre-made and processed foods. These tend to be higher in sodium, added sugar, and fat.  Reduce your daily sodium intake. Most people with hypertension should eat less than 1,500 mg of sodium a day.  Do not drink alcohol if: ? Your health care provider tells you not to drink. ? You are pregnant, may be pregnant, or are planning to become pregnant.  If you drink alcohol: ? Limit how much you use to:  0-1 drink a day for women.  0-2 drinks a day for men. ? Be aware of how much alcohol is in your drink. In the [place] one drink equals one 12 oz bottle of beer (355 mL), one 5 oz glass of wine (148 mL), or one 1 oz glass of hard liquor (44 mL).   Lifestyle  Work with your health care provider to maintain a healthy body weight or to lose weight. Ask what an ideal weight is for you.  Get at least 30 minutes of exercise most days of the week. Activities may include walking, swimming, or biking.  Include exercise to strengthen your muscles (resistance exercise), such as Pilates or lifting weights, as part of your weekly exercise routine. Try to do these types of exercises for 30 minutes at least 3 days a week.  Do not use any products that contain nicotine or tobacco, such as cigarettes, e-cigarettes, and chewing tobacco. If you need help quitting, ask your health care provider.  Monitor your blood pressure  at home as told by your health care provider.  Keep all follow-up visits as told by your health care provider. This is important.   Medicines  Take over-the-counter and prescription medicines only as told by your health care provider. Follow directions carefully. Blood pressure medicines must be taken as prescribed.  Do not skip doses of blood pressure medicine. Doing this puts you at risk for problems and can make the medicine less effective.  Ask your health care provider about side effects or reactions to medicines that you should watch for. Contact a health care provider if you:  Think you are having a reaction to a medicine you are taking.  Have headaches that keep  coming back (recurring).  Feel dizzy.  Have swelling in your ankles.  Have trouble with your vision. Get help right away if you:  Develop a severe headache or confusion.  Have unusual weakness or numbness.  Feel faint.  Have severe pain in your chest or abdomen.  Vomit repeatedly.  Have trouble breathing. Summary  Hypertension is when the force of blood pumping through your arteries is too strong. If this condition is not controlled, it may put you at risk for serious complications.  Your personal target blood pressure may vary depending on your medical conditions, your age, and other factors. For most people, a normal blood pressure is less than 120/80.  Hypertension is treated with lifestyle changes, medicines, or a combination of both. Lifestyle changes include losing weight, eating a healthy, low-sodium diet, exercising more, and limiting alcohol. This information is not intended to replace advice given to you by your health care provider. Make sure you discuss any questions you have with your health care provider. Document Revised: 05/03/2018 Document Reviewed: 05/03/2018 Elsevier Patient Education  2021 [employer].   Hypertension, Adult High blood pressure (hypertension) is when the force of  blood pumping through the arteries is too strong. The arteries are the blood vessels that carry blood from the heart throughout the body. Hypertension forces the heart to work harder to pump blood and may cause arteries to become narrow or stiff. Untreated or uncontrolled hypertension can cause a heart attack, heart failure, a stroke, kidney disease, and other problems. A blood pressure reading consists of a higher number over a lower number. Ideally, your blood pressure should be below 120/80. The first ("top") number is called the systolic pressure. It is a measure of the pressure in your arteries as your heart beats. The second ("bottom") number is called the diastolic pressure. It is a measure of the pressure in your arteries as the heart relaxes. What are the causes? The exact cause of this condition is not known. There are some conditions that result in or are related to high blood pressure. What increases the risk? Some risk factors for high blood pressure are under your control. The following factors may make you more likely to develop this condition:  Smoking.  Having type 2 diabetes mellitus, high cholesterol, or both.  Not getting enough exercise or physical activity.  Being overweight.  Having too much fat, sugar, calories, or salt (sodium) in your diet.  Drinking too much alcohol. Some risk factors for high blood pressure may be difficult or impossible to change. Some of these factors include:  Having chronic kidney disease.  Having a family history of high blood pressure.  Age. Risk increases with age.  Race. You may be at higher risk if you are African American.  Gender. Men are at higher risk than women before age 60. After age 68, women are at higher risk than men.  Having obstructive sleep apnea.  Stress. What are the signs or symptoms? High blood pressure may not cause symptoms. Very high blood pressure (hypertensive crisis) may  cause:  Headache.  Anxiety.  Shortness of breath.  Nosebleed.  Nausea and vomiting.  Vision changes.  Severe chest pain.  Seizures. How is this diagnosed? This condition is diagnosed by measuring your blood pressure while you are seated, with your arm resting on a flat surface, your legs uncrossed, and your feet flat on the floor. The cuff of the blood pressure monitor will be placed directly against the skin of your upper  arm at the level of your heart. It should be measured at least twice using the same arm. Certain conditions can cause a difference in blood pressure between your right and left arms. Certain factors can cause blood pressure readings to be lower or higher than normal for a short period of time:  When your blood pressure is higher when you are in a health care provider's office than when you are at home, this is called white coat hypertension. Most people with this condition do not need medicines.  When your blood pressure is higher at home than when you are in a health care provider's office, this is called masked hypertension. Most people with this condition may need medicines to control blood pressure. If you have a high blood pressure reading during one visit or you have normal blood pressure with other risk factors, you may be asked to:  Return on a different day to have your blood pressure checked again.  Monitor your blood pressure at home for 1 week or longer. If you are diagnosed with hypertension, you may have other blood or imaging tests to help your health care provider understand your overall risk for other conditions. How is this treated? This condition is treated by making healthy lifestyle changes, such as eating healthy foods, exercising more, and reducing your alcohol intake. Your health care provider may prescribe medicine if lifestyle changes are not enough to get your blood pressure under control, and if:  Your systolic blood pressure is above  130.  Your diastolic blood pressure is above 80. Your personal target blood pressure may vary depending on your medical conditions, your age, and other factors. Follow these instructions at home: Eating and drinking  Eat a diet that is high in fiber and potassium, and low in sodium, added sugar, and fat. An example eating plan is called the DASH (Dietary Approaches to Stop Hypertension) diet. To eat this way: ? Eat plenty of fresh fruits and vegetables. Try to fill one half of your plate at each meal with fruits and vegetables. ? Eat whole grains, such as whole-wheat pasta, brown rice, or whole-grain bread. Fill about one fourth of your plate with whole grains. ? Eat or drink low-fat dairy products, such as skim milk or low-fat yogurt. ? Avoid fatty cuts of meat, processed or cured meats, and poultry with skin. Fill about one fourth of your plate with lean proteins, such as fish, chicken without skin, beans, eggs, or tofu. ? Avoid pre-made and processed foods. These tend to be higher in sodium, added sugar, and fat.  Reduce your daily sodium intake. Most people with hypertension should eat less than 1,500 mg of sodium a day.  Do not drink alcohol if: ? Your health care provider tells you not to drink. ? You are pregnant, may be pregnant, or are planning to become pregnant.  If you drink alcohol: ? Limit how much you use to:  0-1 drink a day for women.  0-2 drinks a day for men. ? Be aware of how much alcohol is in your drink. In the [place] one drink equals one 12 oz bottle of beer (355 mL), one 5 oz glass of wine (148 mL), or one 1 oz glass of hard liquor (44 mL).   Lifestyle  Work with your health care provider to maintain a healthy body weight or to lose weight. Ask what an ideal weight is for you.  Get at least 30 minutes of exercise most days of the week.  Activities may include walking, swimming, or biking.  Include exercise to strengthen your muscles (resistance exercise), such  as Pilates or lifting weights, as part of your weekly exercise routine. Try to do these types of exercises for 30 minutes at least 3 days a week.  Do not use any products that contain nicotine or tobacco, such as cigarettes, e-cigarettes, and chewing tobacco. If you need help quitting, ask your health care provider.  Monitor your blood pressure at home as told by your health care provider.  Keep all follow-up visits as told by your health care provider. This is important.   Medicines  Take over-the-counter and prescription medicines only as told by your health care provider. Follow directions carefully. Blood pressure medicines must be taken as prescribed.  Do not skip doses of blood pressure medicine. Doing this puts you at risk for problems and can make the medicine less effective.  Ask your health care provider about side effects or reactions to medicines that you should watch for. Contact a health care provider if you:  Think you are having a reaction to a medicine you are taking.  Have headaches that keep coming back (recurring).  Feel dizzy.  Have swelling in your ankles.  Have trouble with your vision. Get help right away if you:  Develop a severe headache or confusion.  Have unusual weakness or numbness.  Feel faint.  Have severe pain in your chest or abdomen.  Vomit repeatedly.  Have trouble breathing. Summary  Hypertension is when the force of blood pumping through your arteries is too strong. If this condition is not controlled, it may put you at risk for serious complications.  Your personal target blood pressure may vary depending on your medical conditions, your age, and other factors. For most people, a normal blood pressure is less than 120/80.  Hypertension is treated with lifestyle changes, medicines, or a combination of both. Lifestyle changes include losing weight, eating a healthy, low-sodium diet, exercising more, and limiting alcohol. This  information is not intended to replace advice given to you by your health care provider. Make sure you discuss any questions you have with your health care provider. Document Revised: 05/03/2018 Document Reviewed: 05/03/2018 Elsevier Patient Education  2021 [employer].

## 2020-09-26 ENCOUNTER — Encounter: Payer: Self-pay | Admitting: Family Medicine

## 2020-09-30 NOTE — Unmapped (Signed)
I called and spoke with the patient regarding his follow up appointments. He asked for a rundown of what all he'll be having done and I gave him the full list. I also explained I'd send him a detailed letter and he agreed.

## 2020-10-09 NOTE — Unmapped (Signed)
The [facility] has made a second and final attempt to reach this patient to refill the following medication: mycophenolate, tacrolimus and maintenance meds.      We have left voicemails on the following phone numbers: [phone].    Dates contacted: 01/26 & 02/02  Last scheduled delivery: 09/12/2020    The patient may be at risk of non-compliance with this medication. The patient should call the [facility] at [phone] (option 4) to refill medication.    Andrew Dickerson   [facility] Specialty Technician

## 2020-10-10 MED ORDER — VALSARTAN 160 MG TABLET
ORAL_TABLET | Freq: Every day | ORAL | 3 refills | 90 days | Status: CP
Start: 2020-10-10 — End: 2021-10-10
  Filled 2020-10-14: qty 180, 90d supply, fill #0

## 2020-10-10 NOTE — Unmapped (Signed)
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     Was a Nurse, learning disability used for this call? No    Completed refill call assessment today to schedule patient's medication shipment from the [facility] [phone]).       Specialty medication(s) and dose(s) confirmed: Regimen is correct and unchanged.   Changes to medications: Andrew Dickerson reports no changes at this time.  Changes to insurance: No  Questions for the pharmacist: No    Confirmed patient received Welcome Packet with first shipment. The patient will receive a drug information handout for each medication shipped and additional FDA Medication Guides as required.       DISEASE/MEDICATION-SPECIFIC INFORMATION        N/A    SPECIALTY MEDICATION ADHERENCE     Medication Adherence    Patient reported X missed doses in the last month: 0  Specialty Medication: Mycophenolate 500mg   Patient is on additional specialty medications: Yes  Additional Specialty Medications: Tacrolimus 0.5mg   Patient Reported Additional Medication X Missed Doses in the Last Month: 0  Patient is on more than two specialty medications: No  Informant: patient                Mycophenolate 500 mg: 10 days of medicine on hand   Tacrolimus 0.5 mg: 10 days of medicine on hand       SHIPPING     Shipping address confirmed in Epic.     Delivery Scheduled: Yes, Expected medication delivery date: 10/15/20.     Medication will be delivered via UPS to the prescription address in Epic WAM.    Andrew Dickerson   [facility] Specialty Technician

## 2020-10-14 MED FILL — DILTIAZEM CD 180 MG CAPSULE,EXTENDED RELEASE 24 HR: ORAL | 30 days supply | Qty: 60 | Fill #3

## 2020-10-14 MED FILL — LEVOTHYROXINE 75 MCG TABLET: ORAL | 30 days supply | Qty: 30 | Fill #3

## 2020-10-14 MED FILL — TACROLIMUS 0.5 MG CAPSULE, IMMEDIATE-RELEASE: ORAL | 30 days supply | Qty: 90 | Fill #7

## 2020-10-14 MED FILL — MYCOPHENOLATE MOFETIL 500 MG TABLET: ORAL | 30 days supply | Qty: 60 | Fill #2

## 2020-10-14 MED FILL — CHLORTHALIDONE 25 MG TABLET: ORAL | 30 days supply | Qty: 30 | Fill #3

## 2020-10-19 ENCOUNTER — Other Ambulatory Visit: Payer: Self-pay | Admitting: Family Medicine

## 2020-10-19 DIAGNOSIS — R7309 Other abnormal glucose: Secondary | ICD-10-CM

## 2020-10-21 DIAGNOSIS — E785 Hyperlipidemia, unspecified: Principal | ICD-10-CM

## 2020-10-21 DIAGNOSIS — Z125 Encounter for screening for malignant neoplasm of prostate: Principal | ICD-10-CM

## 2020-10-21 DIAGNOSIS — Z941 Heart transplant status: Principal | ICD-10-CM

## 2020-10-21 DIAGNOSIS — Z79899 Other long term (current) drug therapy: Principal | ICD-10-CM

## 2020-10-22 ENCOUNTER — Encounter: Payer: Self-pay | Admitting: Family Medicine

## 2020-10-22 ENCOUNTER — Ambulatory Visit: Admit: 2020-10-22 | Discharge: 2020-10-23 | Payer: MEDICARE

## 2020-10-22 DIAGNOSIS — Z941 Heart transplant status: Principal | ICD-10-CM

## 2020-10-22 DIAGNOSIS — Z125 Encounter for screening for malignant neoplasm of prostate: Principal | ICD-10-CM

## 2020-10-22 DIAGNOSIS — Z79899 Other long term (current) drug therapy: Principal | ICD-10-CM

## 2020-10-22 DIAGNOSIS — E785 Hyperlipidemia, unspecified: Principal | ICD-10-CM

## 2020-10-22 DIAGNOSIS — Z23 Encounter for immunization: Secondary | ICD-10-CM | POA: Diagnosis not present

## 2020-10-22 LAB — CBC W/ AUTO DIFF
BASOPHILS ABSOLUTE COUNT: 0.1 10*9/L (ref 0.0–0.1)
BASOPHILS RELATIVE PERCENT: 0.7 %
EOSINOPHILS ABSOLUTE COUNT: 0.2 10*9/L (ref 0.0–0.4)
EOSINOPHILS RELATIVE PERCENT: 2 %
HEMATOCRIT: 44.1 % (ref 41.0–53.0)
HEMOGLOBIN: 14.2 g/dL (ref 13.5–17.5)
LARGE UNSTAINED CELLS: 2 % (ref 0–4)
LYMPHOCYTES ABSOLUTE COUNT: 1.7 10*9/L (ref 1.5–5.0)
LYMPHOCYTES RELATIVE PERCENT: 16 %
MEAN CORPUSCULAR HEMOGLOBIN CONC: 32.1 g/dL (ref 31.0–37.0)
MEAN CORPUSCULAR HEMOGLOBIN: 30.2 pg (ref 26.0–34.0)
MEAN CORPUSCULAR VOLUME: 93.9 fL (ref 80.0–100.0)
MEAN PLATELET VOLUME: 8.8 fL (ref 7.0–10.0)
MONOCYTES ABSOLUTE COUNT: 0.5 10*9/L (ref 0.2–0.8)
MONOCYTES RELATIVE PERCENT: 4.4 %
NEUTROPHILS ABSOLUTE COUNT: 7.8 10*9/L — ABNORMAL HIGH (ref 2.0–7.5)
NEUTROPHILS RELATIVE PERCENT: 75 %
PLATELET COUNT: 323 10*9/L (ref 150–440)
RED BLOOD CELL COUNT: 4.69 10*12/L (ref 4.50–5.90)
RED CELL DISTRIBUTION WIDTH: 14 % (ref 12.0–15.0)
WBC ADJUSTED: 10.4 10*9/L (ref 4.5–11.0)

## 2020-10-22 LAB — COMPREHENSIVE METABOLIC PANEL
ALBUMIN: 4.2 g/dL (ref 3.4–5.0)
ALKALINE PHOSPHATASE: 104 U/L (ref 46–116)
ALT (SGPT): 23 U/L (ref 10–49)
ANION GAP: 10 mmol/L (ref 5–14)
AST (SGOT): 20 U/L (ref <=34)
Albumin: 4.2 (ref 3.5–5.0)
BILIRUBIN TOTAL: 0.8 mg/dL (ref 0.3–1.2)
BLOOD UREA NITROGEN: 28 mg/dL — ABNORMAL HIGH (ref 9–23)
BUN / CREAT RATIO: 18
CALCIUM: 10.5 mg/dL — ABNORMAL HIGH (ref 8.7–10.4)
CHLORIDE: 106 mmol/L (ref 98–107)
CO2: 22 mmol/L (ref 20.0–31.0)
CREATININE: 1.52 mg/dL — ABNORMAL HIGH
Calcium: 10.5 (ref 8.7–10.7)
EGFR CKD-EPI AA MALE: 56 mL/min/{1.73_m2} — ABNORMAL LOW (ref >=60)
EGFR CKD-EPI NON-AA MALE: 48 mL/min/{1.73_m2} — ABNORMAL LOW (ref >=60)
GFR calc Af Amer: 56
GFR calc non Af Amer: 48
GLUCOSE RANDOM: 134 mg/dL — ABNORMAL HIGH (ref 70–99)
POTASSIUM: 4.5 mmol/L (ref 3.5–5.1)
PROTEIN TOTAL: 7.1 g/dL (ref 5.7–8.2)
SODIUM: 138 mmol/L (ref 135–145)

## 2020-10-22 LAB — HEMOGLOBIN A1C
ESTIMATED AVERAGE GLUCOSE: 123 mg/dL
HEMOGLOBIN A1C: 5.9 % — ABNORMAL HIGH (ref 4.8–5.6)
Hemoglobin A1C: 5.9

## 2020-10-22 LAB — LIPID PANEL
CHOLESTEROL/HDL RATIO SCREEN: 3.8 (ref 1.0–4.5)
CHOLESTEROL: 124 mg/dL (ref <=200)
Cholesterol: 124 (ref 0–200)
HDL CHOLESTEROL: 33 mg/dL — ABNORMAL LOW (ref 40–60)
HDL: 33 — AB (ref 35–70)
LDL CHOLESTEROL CALCULATED: 61 mg/dL (ref 40–99)
LDL Cholesterol: 61
NON-HDL CHOLESTEROL: 91 mg/dL (ref 70–130)
TRIGLYCERIDES: 149 mg/dL (ref 0–150)
Triglycerides: 149 (ref 40–160)
VLDL CHOLESTEROL CAL: 29.8 mg/dL (ref 12–42)

## 2020-10-22 LAB — MAGNESIUM: MAGNESIUM: 1.7 mg/dL (ref 1.6–2.6)

## 2020-10-22 LAB — PSA
PROSTATE SPECIFIC ANTIGEN: 0.7 ng/mL (ref 0.00–4.00)
PSA: 0.7

## 2020-10-22 LAB — TSH
THYROID STIMULATING HORMONE: 2.093 u[IU]/mL (ref 0.550–4.780)
TSH: 2.09 (ref 0.41–5.90)

## 2020-10-22 LAB — TACROLIMUS LEVEL: TACROLIMUS BLOOD: 7.5 ng/mL

## 2020-10-22 LAB — CBC AND DIFFERENTIAL
HCT: 44 (ref 41–53)
Hemoglobin: 14.2 (ref 13.5–17.5)
Neutrophils Absolute: 7.8
Platelets: 323 (ref 150–399)
WBC: 10.4

## 2020-10-22 LAB — HEPATIC FUNCTION PANEL
ALT: 23 (ref 10–40)
AST: 20 (ref 14–40)
Alkaline Phosphatase: 104 (ref 25–125)
Bilirubin, Total: 0.8

## 2020-10-22 LAB — BASIC METABOLIC PANEL
BUN: 28 — AB (ref 4–21)
CO2: 22 (ref 13–22)
Chloride: 106 (ref 99–108)
Creatinine: 1.5 — AB (ref 0.6–1.3)
Glucose: 134
Potassium: 4.5 (ref 3.4–5.3)
Sodium: 138 (ref 137–147)

## 2020-10-22 LAB — CBC: RBC: 4.69 (ref 3.87–5.11)

## 2020-10-22 MED ADMIN — perflutren lipid microspheres (DEFINITY) injection 0.6 mL: .6 mL | INTRAVENOUS | @ 16:00:00 | Stop: 2020-10-22

## 2020-10-22 MED ADMIN — metoprolol (LOPRESSOR) injection: INTRAVENOUS | @ 16:00:00 | Stop: 2020-10-22

## 2020-10-22 MED ADMIN — DOBUTamine 1,000 mg in dextrose 5% 250 ml (4,000 mcg/ml) infusion PMB: INTRAVENOUS | @ 15:00:00 | Stop: 2020-10-22

## 2020-10-22 NOTE — Unmapped (Signed)
[facility] PHARMACY NOTE  10/22/2020   Andrew Dickerson  [id number]    Medication changes today:   1. NTD    Education/Adherence tools provided today:  1.provided updated medication list  2. discussed adherence reminder tools such as cell phone alarms    Follow up items:  1. Tacrolimus dosing pending level today    Next visit with pharmacy in PRN  ____________________________________________________________________    Andrew Dickerson is a 65 y.o. 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 Patient was maintained on LVAD support prior to transplantation.    Other PMH significant for hypertension, HLD, Diabetes, BPH    Seen by pharmacy today for: adherence education and medication management    CC:  Patient has no complaints today        BP 134/71  10/22/2020   Pulse 90  10/22/2020   Temp 36.2 ??C (97.2 ??F)  10/22/2020   SpO2 98 %  10/22/2020   Weight 107.6 kg (237 lb 3.2 oz) (A)  10/22/2020   Height 1.727 m (5' 8)  10/22/2020   BMI (Calculated) 36.07 (A)  10/22/2020       No Known Allergies    All medications reviewed and updated. Medication list includes revisions made during today???s encounter    Outpatient Encounter Medications as of 10/22/2020   Medication Sig Dispense Refill   ??? ezetimibe (ZETIA) 10 mg tablet Take 10 mg by mouth daily.     ??? [DISCONTINUED] magnesium oxide 500 mg Tab Take 1 tablet by mouth two (2) times a day.     ??? amLODIPine (NORVASC) 10 MG tablet Take 1 tablet (10 mg total) by mouth daily. 90 tablet 3   ??? aspirin (ADULT LOW DOSE ASPIRIN) 81 MG tablet Take 1 tablet (81 mg total) by mouth daily. 90 tablet 3   ??? atorvastatin (LIPITOR) 80 MG tablet Take 1 tablet (80 mg total) by mouth daily. 90 tablet 3   ??? blood sugar diagnostic (ACCU-CHEK AVIVA PLUS TEST STRP) Strp CHECK BLOOD SUGAR BEFORE MEALS, AT BEDTIME, & AS NEEDED FOR HYPERGLYCEMIA UP TO 6 TIMES A DAY 200 strip 11   ??? chlorthalidone (HYGROTON) 25 MG tablet Take 1 tablet (25 mg total) by mouth daily. 30 tablet 11   ??? cholecalciferol, vitamin D3, 1,000 unit capsule Take 3,000 Units by mouth daily.     ??? diltiazem (CARDIZEM CD) 180 MG 24 hr capsule Take 2 capsules (360 mg total) by mouth daily. 60 capsule 11   ??? famotidine (PEPCID) 20 MG tablet Take 1 tablet (20 mg total) by mouth Two (2) times a day. 180 tablet 3   ??? levothyroxine (SYNTHROID) 75 MCG tablet Take 1 tablet (75 mcg total) by mouth daily. 30 tablet 11   ??? magnesium oxide (MAG-OX) 400 mg (241.3 mg elemental magnesium) tablet Take 1 tablet (400 mg total) by mouth Two (2) times a day. 120 tablet 11   ??? metFORMIN (GLUCOPHAGE) 1000 MG tablet Take 1,000 mg by mouth Two (2) times a day.     ??? mycophenolate (CELLCEPT) 500 mg tablet Take 1 tablet (500 mg total) by mouth Two (2) times a day. 180 tablet 3   ??? tacrolimus (PROGRAF) 0.5 MG capsule Take 2 capsules (1 mg total) by mouth daily AND 1 capsule (0.5 mg total) nightly. 90 capsule 11   ??? tamsulosin (FLOMAX) 0.4 mg capsule Take 1 capsule (0.4 mg total) by mouth daily. 90 capsule 3   ??? valsartan (DIOVAN) 160  MG tablet Take 2 tablets (320 mg total) by mouth daily. 180 tablet 3     Facility-Administered Encounter Medications as of 10/22/2020   Medication Dose Route Frequency Provider Last Rate Last Admin   ??? [DISCONTINUED] magnesium oxide (MAG-OX) tablet 800 mg  800 mg Oral Once Andrew Shi, MD           Induction agent : None    CURRENT IMMUNOSUPPRESSION:   tacrolimus 1 mg qAM, 0.5 mg qPM PO prograf goal: 5-8   mycophenolate 500 mg PO BID   steroid free     Patient has mild and tolerable tremor that is stable    IMMUNOSUPPRESSION DRUG LEVELS:  Lab Results   Component Value Date    Tacrolimus, Trough 6.0 02/26/2020    Tacrolimus, Trough 9.0 01/21/2020    Tacrolimus, Trough 4.5 (A) 09/21/2019    Tacrolimus, Trough 7.6 11/29/2014    Tacrolimus, Trough 9.2 07/24/2014    Tacrolimus, Trough 9.0 06/05/2014    Tacrolimus Lvl 7.4 05/19/2020    Tacrolimus, Timed 7.5 10/22/2020    Tacrolimus, Timed 5.7 09/17/2020    Tacrolimus, Timed 6.4 07/28/2020     Prograf level is accurate 12 hour trough; takes medication a 0800 and 2000    Graft function: stable  DSA: has a history of weakly positive 11/18; DSA panels WNL ever since with most recent levels on 03/25/20  Biopsies to date: First two post transplant biopsies in 2013/2014 were positive for mild ACR, but no rejections since  WBC/ANC:  WBC WNL; ANC slighly above normal range    Plan: Will maintain current immunosuppression. Will address tacrolimus dosing pending level today.     ID prophylaxis:   CMV Status: D- /R-, low risk. CMV prophylaxis with valganciclovir 900 mg daily - therapy complete in 2013  PCP: Prophylaxis with bactrim DS 1 tab MWF x 6 months - therapy complete  Thrush: therapy complete  Patient has completed prophylaxis    Plan: Continue to monitor off therapy    CAV Prophylaxis: Patient asks about aspirin for primary prevention of ASCVD as he has seen in the news that recommendations may have changed.  Aspirin: asa 81 mg   Statin: atorvastatin; currently 80 mg  Misc: diltiazem currently on 360 mg daily  Most recent fasting lipid panel: Today; TG 149, TC 124, HDL 33, LDL 61  Plan: Continue medications as above. Discussed the trials involved with the primary prevention aspirin recommendations and discussed his individual factors, including the use of aspirin for CAV ppx. Patient agreeable to continue.    BP: Goal < 140/90. Encounter vitals reported above  Home BP ranges: 120s-150s/60s-70s with majority of values <140/90 mmHg  Current meds include: amlodipine 10 mg qAM, diltiazem 360 mg qPM, chlorthalidone 25 mg qAM, valsartan 320 mg daily  Plan: within goal; continue medications as above    Anemia:  Lab Results   Component Value Date    HGB 14.2 10/22/2020     Lab Results   Component Value Date    HCT 44.1 10/22/2020     Iron panel:  Lab Results   Component Value Date    IRON 90 06/09/2012    TIBC 336 06/09/2012    FERRITIN 511 (H) 06/09/2012     Lab Results   Component Value Date    Iron Saturation (%) 27 06/09/2012     Plan: hematocrit/hemoglobin within goal. Continue to monitor.      DM:   Lab Results   Component Value Date  A1C 5.9 (H) 10/22/2020   Goal A1c < 7  Currently on: metformin 1000 mg PO BID  Home BS log: fasting AM BGs range 119-149  Exercise: walks occasionally  Hypoglycemia: no  Plan:  His PCP is managing his diabetes, within goal will continue to monitor    Electrolytes: wnl; 1.7 today  Meds currently on: magnesium 400 mg BID  Plan: Continue medication as above    GI: Denies constipation; endorses occasional reflux  Meds currently on: famotidine 20 mg PO BID  Plan: Continue medication as above.    Pain: denies pain at this time  Meds currently on: NTD  Plan: Continue to monitor    Bone health:   Patient currently off steroid therapy  Vitamin D Level: last level is 53 on 09/23/20; Goal > 30   Last DEXA results: N/A  Current meds include: cholecalciferol 3000 units daily  Plan: Within goal. Continue to monitor.     Men's Health:  Andrew Dickerson is a 65 y.o. male. Patient reports no men's/women's health issues. Denies urinary symptoms at this time.  Most recent PSA 0.7 today  Meds currently on: tamsulosin 0.4 mg daily  Plan: Continue medication as above.     Hypothyroidism  Last TSH 2.093 today  Meds currently on: Levothyroxine 75 mcg daily  Plan: Within goal, continue to monitor      Adherence: Patient has excellent understanding of medications.  Patient  does fill their own pill box on a regular basis at home.  Patient brought medication card:Utilizes an app on his phone to track and remind him to take medications  Pill box:did not bring  Patient requested refills for the following meds: None  Corrections needed in Epic medication list: Added Zetia 10 mg daily  Provided moderate adherence counseling/intervention      Spent approximately 20 minutes on educating this patient and greater than 50% was spent in direct face to face counseling regarding post transplant medication education. Questions and concerns were address to patient's satisfaction.    During this visit, the following was completed:   BG log data assessment  BP log data assessment  Labs ordered and evaluated  complex treatment plan >1 DS   Patient education was completed for 11-24 minutes     All questions/concerns were addressed to the patient's satisfaction.  __________________________________________  PATIENT SEEN AND EVALUATED BY:    Erin E. Manson Passey, PharmD, BCPS  PGY2 Pharmacy Resident, Pharmacotherapy    Maxyne Derocher TEETER Niam Nepomuceno, PHARMD, BCPS, CPP  [employer] TRANSPLANT PHARMACIST PRACTITIONER  PAGER [phone]

## 2020-10-22 NOTE — Unmapped (Signed)
Patient ID verified with Name and Date of Birth. The [employer] COVID-19 Fact Sheet given to patient. All screening questions answered. Verbal consent taken. Vaccine administered as ordered.  See immunization history for documentation.  COVID-19 Vaccination Record Card provided to patient. Patient tolerated procedure well with no issues noted. Recommend that patient stay in observation area for 15 minutes.

## 2020-10-23 NOTE — Unmapped (Signed)
Reviewed with Dr. Cherly Hensen    Dobutamine Stress Echo        CXR:     Anticipated postoperative changes associated with heart transplantation, and the cardiac silhouette remains similar in size relative to the comparison imaging from December 2020. Furthermore, there is no overt pulmonary edema.   ??  There is no significant imaging evidence of infectious pneumonia or other acute pulmonary process    DSA: Pending      Recent Labs:   Office Visit on 10/22/2020   Component Date Value Ref Range Status   ??? TSH 10/22/2020 2.093  0.550 - 4.780 uIU/mL Final   ??? Tacrolimus, Timed 10/22/2020 7.5  ng/mL Final   ??? PSA 10/22/2020 0.70  0.00 - 4.00 ng/mL Final   ??? Magnesium 10/22/2020 1.7  1.6 - 2.6 mg/dL Final   ??? Triglycerides 10/22/2020 149  0 - 150 mg/dL Final   ??? Cholesterol 10/22/2020 124  <=200 mg/dL Final   ??? HDL 62/69/4854 33* 40 - 60 mg/dL Final   ??? LDL Calculated 10/22/2020 61  40 - 99 mg/dL Final   ??? VLDL Cholesterol Cal 10/22/2020 29.8  12 - 42 mg/dL Final   ??? Chol/HDL Ratio 10/22/2020 3.8  1.0 - 4.5 Final   ??? Non-HDL Cholesterol 10/22/2020 91  70 - 130 mg/dL Final   ??? FASTING 62/70/3500 Yes   Final   ??? Hemoglobin A1C 10/22/2020 5.9* 4.8 - 5.6 % Final   ??? Estimated Average Glucose 10/22/2020 123  mg/dL Final   ??? Sodium 93/81/8299 138  135 - 145 mmol/L Final   ??? Potassium 10/22/2020 4.5  3.5 - 5.1 mmol/L Final   ??? Chloride 10/22/2020 106  98 - 107 mmol/L Final   ??? Anion Gap 10/22/2020 10  5 - 14 mmol/L Final   ??? CO2 10/22/2020 22.0  20.0 - 31.0 mmol/L Final   ??? BUN 10/22/2020 28* 9 - 23 mg/dL Final   ??? Creatinine 10/22/2020 1.52* 0.60 - 1.10 mg/dL Final   ??? BUN/Creatinine Ratio 10/22/2020 18   Final   ??? EGFR CKD-EPI Non-African American,* 10/22/2020 48* >=60 mL/min/1.61m2 Final   ??? EGFR CKD-EPI African American, Male 10/22/2020 56* >=60 mL/min/1.44m2 Final   ??? Glucose 10/22/2020 134* 70 - 99 mg/dL Final   ??? Calcium 37/16/9678 10.5* 8.7 - 10.4 mg/dL Final   ??? Albumin 93/81/0175 4.2  3.4 - 5.0 g/dL Final   ??? Total Protein 10/22/2020 7.1  5.7 - 8.2 g/dL Final   ??? Total Bilirubin 10/22/2020 0.8  0.3 - 1.2 mg/dL Final   ??? AST 06/30/8526 20  <=34 U/L Final   ??? ALT 10/22/2020 23  10 - 49 U/L Final   ??? Alkaline Phosphatase 10/22/2020 104  46 - 116 U/L Final   ??? WBC 10/22/2020 10.4  4.5 - 11.0 10*9/L Final   ??? RBC 10/22/2020 4.69  4.50 - 5.90 10*12/L Final   ??? HGB 10/22/2020 14.2  13.5 - 17.5 g/dL Final   ??? HCT 78/24/2353 44.1  41.0 - 53.0 % Final   ??? MCV 10/22/2020 93.9  80.0 - 100.0 fL Final   ??? MCH 10/22/2020 30.2  26.0 - 34.0 pg Final   ??? MCHC 10/22/2020 32.1  31.0 - 37.0 g/dL Final   ??? RDW 61/44/3154 14.0  12.0 - 15.0 % Final   ??? MPV 10/22/2020 8.8  7.0 - 10.0 fL Final   ??? Platelet 10/22/2020 323  150 - 440 10*9/L Final   ??? Neutrophils % 10/22/2020 75.0  %  Final   ??? Lymphocytes % 10/22/2020 16.0  % Final   ??? Monocytes % 10/22/2020 4.4  % Final   ??? Eosinophils % 10/22/2020 2.0  % Final   ??? Basophils % 10/22/2020 0.7  % Final   ??? Absolute Neutrophils 10/22/2020 7.8* 2.0 - 7.5 10*9/L Final   ??? Absolute Lymphocytes 10/22/2020 1.7  1.5 - 5.0 10*9/L Final   ??? Absolute Monocytes 10/22/2020 0.5  0.2 - 0.8 10*9/L Final   ??? Absolute Eosinophils 10/22/2020 0.2  0.0 - 0.4 10*9/L Final   ??? Absolute Basophils 10/22/2020 0.1  0.0 - 0.1 10*9/L Final   ??? Large Unstained Cells 10/22/2020 2  0 - 4 % Final   ??? Hypochromasia 10/22/2020 Moderate* Not Present Final   Appointment on 10/22/2020   Component Date Value Ref Range Status   ??? EKG Ventricular Rate 10/22/2020 90  BPM Incomplete   ??? EKG Atrial Rate 10/22/2020 90  BPM Incomplete   ??? EKG P-R Interval 10/22/2020 138  ms Incomplete   ??? EKG QRS Duration 10/22/2020 86  ms Incomplete   ??? EKG Q-T Interval 10/22/2020 352  ms Incomplete   ??? EKG QTC Calculation 10/22/2020 430  ms Incomplete   ??? EKG Calculated P Axis 10/22/2020 20  degrees Incomplete   ??? EKG Calculated R Axis 10/22/2020 7  degrees Incomplete   ??? EKG Calculated T Axis 10/22/2020 24  degrees Incomplete   ??? QTC Fredericia 10/22/2020 403  ms Incomplete   Documentation on 09/17/2020   Component Date Value Ref Range Status   ??? Vitamin D Total (25OH) 09/17/2020 53  ng/mL Final   Documentation on 09/17/2020   Component Date Value Ref Range Status   ??? Tacrolimus, Timed 09/17/2020 5.7  5.0 - 20.0 mcg/L Final   Documentation on 09/17/2020   Component Date Value Ref Range Status   ??? WBC 09/17/2020 10.3  3.8 - 10.8 Thousand/uL Final   ??? RBC 09/17/2020 4.45  4.20 - 5.80 Million/uL Final   ??? HGB 09/17/2020 13.3  13.2 - 17.1 g/dL Final   ??? HCT 29/56/2130 39.6  38.5 - 50.0 % Final   ??? MCV 09/17/2020 89.0  80.0 - 100.0 fL Final   ??? MCH 09/17/2020 29.9  27.0 - 33.0 pg Final   ??? MCHC 09/17/2020 33.6  32.0 - 36.0 g/dL Final   ??? RDW 86/57/8469 12.6  11.0 - 15.0 % Final   ??? Platelet 09/17/2020 301  140 - 400 Thousand/uL Final   ??? MPV 09/17/2020 10.4  7.5 - 12.5 fL Final   ??? Absolute Neutrophils 09/17/2020 7.6  1.5 - 7.8 cells/uL Final   ??? Absolute Lymphocytes 09/17/2020 1.7  0.85 - 3.9 cells/uL Final   ??? Absolute Monocytes 09/17/2020 0.6  0.2 - 0.95 cells/uL Final   ??? Absolute Eosinophils 09/17/2020 0.3  0.015 - 0.5 cells/uL Final   ??? Absolute Basophils 09/17/2020 0.0  0 - 0.2 cells/uL Final   ??? Neutrophils % 09/17/2020 74.1  % Final   ??? Lymphocytes % 09/17/2020 16.7  % Final   ??? Monocytes % 09/17/2020 5.8  % Final   ??? Eosinophils % 09/17/2020 2.8  % Final   ??? Basophils % 09/17/2020 0.6  % Final   Documentation on 09/17/2020   Component Date Value Ref Range Status   ??? Magnesium 09/17/2020 1.8  1.5 - 2.5 mg/dL Final   Documentation on 09/17/2020   Component Date Value Ref Range Status   ??? Glucose 09/17/2020 125* 65 - 99 mg/dL  Final   ??? BUN 09/17/2020 25  7 - 25 mg/dL Final   ??? Creatinine 09/17/2020 1.34* 0.70 - 1.25 mg/dL Final   ??? BUN/Creatinine Ratio 09/17/2020 19  6 - 22 (calc) Final   ??? EGFR CKD-EPI Non-African American,* 09/17/2020 56* >/=60 mL/min/1.89m2 Final   ??? Sodium 09/17/2020 136  135 - 146 mmol/L Final   ??? Potassium 09/17/2020 4.8  3.5 - 5.3 mmol/L Final   ??? Chloride 09/17/2020 103  98 - 110 mmol/L Final   ??? CO2 09/17/2020 20  20 - 32 mmol/L Final   ??? Calcium 09/17/2020 9.9  8.6 - 10.3 mg/dL Final   ??? Total Protein 09/17/2020 7.1  6.1 - 8.1 g/dL Final   ??? Albumin 16/06/9603 4.6  3.6 - 5.1 g/dL Final   ??? Globulin, Total 09/17/2020 2.5  1.9 - 3.7 g/dL (calc) Final   ??? Albumin/Globulin Ratio 09/17/2020 1.8  1.0 - 2.5 (calc) Final   ??? Total Bilirubin 09/17/2020 0.7  0.2 - 1.2 mg/dL Final   ??? Alkaline Phosphatase 09/17/2020 89  35 - 144 U/L Final   ??? AST 09/17/2020 11  10 - 35 U/L Final   ??? ALT 09/17/2020 15  9 - 46 U/L Final   Documentation on 07/28/2020   Component Date Value Ref Range Status   ??? Tacrolimus, Timed 07/28/2020 6.4  5.0 - 20.0 mcg/L Final   Documentation on 07/28/2020   Component Date Value Ref Range Status   ??? WBC 07/28/2020 8.6  3.8 - 10.8 Thousand/uL Final   ??? RBC 07/28/2020 4.36  4.20 - 5.80 Million/uL Final   ??? HGB 07/28/2020 13.1* 13.2 - 17.1 g/dL Final   ??? HCT 54/05/8118 38.9  38.5 - 50.0 % Final   ??? MCV 07/28/2020 89.2  80.0 - 100.0 fL Final   ??? MCH 07/28/2020 30.0  27.0 - 33.0 pg Final   ??? MCHC 07/28/2020 33.7  32.0 - 36.0 g/dL Final   ??? RDW 14/78/2956 13.1  11.0 - 15.0 % Final   ??? Platelet 07/28/2020 255  140 - 400 Thousand/uL Final   ??? MPV 07/28/2020 10.7  7.5 - 12.5 fL Final   ??? Absolute Neutrophils 07/28/2020 6.4  1.5 - 7.8 cells/uL Final   ??? Absolute Lymphocytes 07/28/2020 1.3  0.85 - 3.9 cells/uL Final   ??? Absolute Monocytes 07/28/2020 0.6  0.2 - 0.95 cells/uL Final   ??? Absolute Eosinophils 07/28/2020 0.3  0.01 - 0.5 cells/uL Final   ??? Absolute Basophils 07/28/2020 0.05  0 - 0.2 cells/uL Final   ??? Neutrophils % 07/28/2020 74  % Final   ??? Lymphocytes % 07/28/2020 14.7  % Final   ??? Monocytes % 07/28/2020 7.1  % Final   ??? Eosinophils % 07/28/2020 3.6  % Final   ??? Basophils % 07/28/2020 0.6  % Final   Documentation on 07/28/2020   Component Date Value Ref Range Status   ??? Magnesium 07/28/2020 1.7  1.5 - 2.5 mg/dL Final   There may be more visits with results that are not included.         Immunosuppression:   Tac: 1 mg/0.5 mg and MMF: 500 mg BID  Tac: 5-8    Changes: None  Next Labs: 2 Months to check CR  RTC: 03/2021 sooner PRN

## 2020-10-28 LAB — HLA DS POST TRANSPLANT
ANTI-DONOR DRW #1 MFI: 317 MFI
ANTI-DONOR DRW #2 MFI: 294 MFI
ANTI-DONOR HLA-A #1 MFI: 55 MFI
ANTI-DONOR HLA-A #2 MFI: 0 MFI
ANTI-DONOR HLA-B #1 MFI: 2 MFI
ANTI-DONOR HLA-B #2 MFI: 102 MFI
ANTI-DONOR HLA-C #1 MFI: 113 MFI
ANTI-DONOR HLA-C #2 MFI: 194 MFI
ANTI-DONOR HLA-DQB #1 MFI: 161 MFI
ANTI-DONOR HLA-DQB #2 MFI: 19 MFI
ANTI-DONOR HLA-DR #1 MFI: 28 MFI
ANTI-DONOR HLA-DR #2 MFI: 31 MFI

## 2020-10-28 LAB — FSAB CLASS 1 ANTIBODY SPECIFICITY: HLA CLASS 1 ANTIBODY RESULT: NEGATIVE

## 2020-10-28 LAB — FSAB CLASS 2 ANTIBODY SPECIFICITY: HLA CL2 AB RESULT: POSITIVE

## 2020-11-07 NOTE — Unmapped (Signed)
[facility] Refill Coordination Note    Specialty Medication(s) to be Shipped:   Transplant: mycophenolate mofetil 500mg  and tacrolimus 0.5mg     Other medication(s) to be shipped:     Chlorthalidone  Diltiazem  Levothyroxine  Valsartan  Aspirin  Famotidine       Andrew Dickerson, DOB: May 13, 1956  Phone: There are no phone numbers on file.      All above HIPAA information was verified with patient.     Was a Nurse, learning disability used for this call? No    Completed refill call assessment today to schedule patient's medication shipment from the [facility] [phone]).       Specialty medication(s) and dose(s) confirmed: Regimen is correct and unchanged.   Changes to medications: [name] reports no changes at this time.  Changes to insurance: No  Questions for the pharmacist: No    Confirmed patient received Welcome Packet with first shipment. The patient will receive a drug information handout for each medication shipped and additional FDA Medication Guides as required.       DISEASE/MEDICATION-SPECIFIC INFORMATION        N/A    SPECIALTY MEDICATION ADHERENCE     Medication Adherence    Patient reported X missed doses in the last month: 0  Specialty Medication: Mycophenolate 500mg   Patient is on additional specialty medications: Yes  Additional Specialty Medications: Tacrolimus 0.5mg   Patient Reported Additional Medication X Missed Doses in the Last Month: 0  Patient is on more than two specialty medications: No  Informant: patient         mycophenolate mofetil 500mg  8 days worth of medication on hand.  tacrolimus 0.5mg  8 days worth of medication on hand.                  SHIPPING     Shipping address confirmed in Epic.     Delivery Scheduled: Yes, Expected medication delivery date: 11/11/20.     Medication will be delivered via UPS to the prescription address in Epic WAM.    Andrew Dickerson   [facility] Specialty Technician

## 2020-11-10 MED FILL — FAMOTIDINE 20 MG TABLET: ORAL | 90 days supply | Qty: 180 | Fill #1

## 2020-11-10 MED FILL — TACROLIMUS 0.5 MG CAPSULE, IMMEDIATE-RELEASE: ORAL | 30 days supply | Qty: 90 | Fill #8

## 2020-11-10 MED FILL — DILTIAZEM CD 180 MG CAPSULE,EXTENDED RELEASE 24 HR: ORAL | 30 days supply | Qty: 60 | Fill #4

## 2020-11-10 MED FILL — ASPIRIN 81 MG TABLET,DELAYED RELEASE: ORAL | 90 days supply | Qty: 90 | Fill #1

## 2020-11-10 MED FILL — LEVOTHYROXINE 75 MCG TABLET: ORAL | 30 days supply | Qty: 30 | Fill #4

## 2020-11-10 MED FILL — AMLODIPINE 10 MG TABLET: ORAL | 90 days supply | Qty: 90 | Fill #1

## 2020-11-10 MED FILL — CHLORTHALIDONE 25 MG TABLET: ORAL | 30 days supply | Qty: 30 | Fill #4

## 2020-11-10 MED FILL — MYCOPHENOLATE MOFETIL 500 MG TABLET: ORAL | 30 days supply | Qty: 60 | Fill #3

## 2020-12-15 MED ORDER — ATORVASTATIN 80 MG TABLET
ORAL_TABLET | Freq: Every day | ORAL | 3 refills | 90 days | Status: CP
Start: 2020-12-15 — End: 2021-12-15
  Filled 2020-12-17: qty 90, 90d supply, fill #0

## 2020-12-15 NOTE — Unmapped (Signed)
[facility] Clinical Assessment & Refill Coordination Note    Andrew Dickerson, DOB: March 08, 1956  Phone: There are no phone numbers on file.    All above HIPAA information was verified with patient.     Was a Nurse, learning disability used for this call? 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 amLODIPine (NORVASC) 10 MG tablet Take 1 tablet (10 mg total) by mouth daily. 90 tablet 3   ??? aspirin (ADULT LOW DOSE ASPIRIN) 81 MG tablet Take 1 tablet (81 mg total) by mouth daily. 90 tablet 3   ??? atorvastatin (LIPITOR) 80 MG tablet Take 1 tablet (80 mg total) by mouth daily. 90 tablet 3   ??? blood sugar diagnostic (ACCU-CHEK AVIVA PLUS TEST STRP) Strp CHECK BLOOD SUGAR BEFORE MEALS, AT BEDTIME, & AS NEEDED FOR HYPERGLYCEMIA UP TO 6 TIMES A DAY 200 strip 11   ??? chlorthalidone (HYGROTON) 25 MG tablet Take 1 tablet (25 mg total) by mouth daily. 30 tablet 11   ??? cholecalciferol, vitamin D3, 1,000 unit capsule Take 3,000 Units by mouth daily.     ??? dilTIAZem (CARDIZEM CD) 180 MG 24 hr capsule Take 2 capsules (360 mg total) by mouth daily. 60 capsule 11   ??? ezetimibe (ZETIA) 10 mg tablet Take 10 mg by mouth daily.     ??? famotidine (PEPCID) 20 MG tablet Take 1 tablet (20 mg total) by mouth Two (2) times a day. 180 tablet 3   ??? levothyroxine (SYNTHROID) 75 MCG tablet Take 1 tablet (75 mcg total) by mouth daily. 30 tablet 11   ??? magnesium oxide (MAG-OX) 400 mg (241.3 mg elemental magnesium) tablet Take 1 tablet (400 mg total) by mouth Two (2) times a day. 120 tablet 11   ??? metFORMIN (GLUCOPHAGE) 1000 MG tablet Take 1,000 mg by mouth Two (2) times a day.     ??? mycophenolate (CELLCEPT) 500 mg tablet Take 1 tablet (500 mg total) by mouth Two (2) times a day. 180 tablet 3   ??? tacrolimus (PROGRAF) 0.5 MG capsule Take 2 capsules (1 mg total) by mouth daily AND 1 capsule (0.5 mg total) nightly. 90 capsule 11   ??? tamsulosin (FLOMAX) 0.4 mg capsule Take 1 capsule (0.4 mg total) by mouth daily. 90 capsule 3   ??? valsartan (DIOVAN) 160 MG tablet Take 2 tablets (320 mg total) by mouth daily. 180 tablet 3     No current facility-administered medications for this visit.        Changes to medications: Cambren reports no changes at this time.    No Known Allergies    Changes to allergies: No    SPECIALTY MEDICATION ADHERENCE     Tacrolimus 0.5mg   : 7 days of medicine on hand   Mycophenolate 500mg   : 7 days of medicine on hand       Medication Adherence    Patient reported X missed doses in the last month: 0  Specialty Medication: mycophenolate 500mg   Patient is on additional specialty medications: Yes  Additional Specialty Medications: Tacrolimus 0.5mg   Patient Reported Additional Medication X Missed Doses in the Last Month: 0          Specialty medication(s) dose(s) confirmed: Regimen is correct and unchanged.     Are there any concerns with adherence? No    Adherence counseling provided? Not needed  CLINICAL MANAGEMENT AND INTERVENTION      Clinical Benefit Assessment:    Do you feel the medicine is effective or helping your condition? Yes    Clinical Benefit counseling provided? Not needed    Adverse Effects Assessment:    Are you experiencing any side effects? No    Are you experiencing difficulty administering your medicine? No    Quality of Life Assessment:    How many days over the past month did your transplant  keep you from your normal activities? For example, brushing your teeth or getting up in the morning. 0    Have you discussed this with your provider? Not needed    Acute Infection Status:    Acute infections noted within Epic:  No active infections  Patient reported infection: None    Therapy Appropriateness:    Is therapy appropriate? Yes, therapy is appropriate and should be continued    DISEASE/MEDICATION-SPECIFIC INFORMATION      N/A    PATIENT SPECIFIC NEEDS     - Does the patient have any physical, cognitive, or cultural barriers? No    - Is the patient high risk? Yes, patient is taking a REMS drug. Medication is dispensed in compliance with REMS program    - Does the patient require a Care Management Plan? No     - Does the patient require physician intervention or other additional services (i.e. nutrition, smoking cessation, social work)? No      SHIPPING     Specialty Medication(s) to be Shipped:   Transplant: mycophenolate mofetil 500mg  and tacrolimus 0.5mg     Other medication(s) to be shipped: lipitor, chlorthalidone, diltiazem, levothyroxine     Changes to insurance: No    Delivery Scheduled: Yes, Expected medication delivery date: 12/18/2020.  However, Rx request for refills was sent to the provider as there are none remaining.     Medication will be delivered via UPS to the confirmed prescription address in [facility].    The patient will receive a drug information handout for each medication shipped and additional FDA Medication Guides as required.  Verified that patient has previously received a Conservation officer, historic buildings and a Surveyor, mining.    All of the patient's questions and concerns have been addressed.    Thad Ranger   [facility] Specialty Pharmacist

## 2020-12-16 NOTE — Patient Instructions (Addendum)
Please stop by lab before you go If you have mychart- we will send your results within 3 business days of [place] receiving them.  If you do not have mychart- we will call you about results within 5 business days of [place] receiving them.  *please also note that you will see labs on mychart as soon as they post. I will later go in and write notes on them- will say "notes from Dr. Yong Channel"  No changes today unless labs lead [place] to make changes  If we do not make a note about forwarding your labs to [facility] in your MyChart results-please send a gentle reminder  Recommended follow up: Return in about 6 months (around 06/18/2021) for follow up- or sooner if needed.

## 2020-12-16 NOTE — Progress Notes (Signed)
Phone: [phone]   Subjective:  Patient presents today for their annual physical. Chief complaint-noted.   See problem oriented charting- ROS- full  review of systems was completed and negative  except for: had some abdominal pain after fall but better, some urinary frequency  The following were reviewed and entered/updated in epic: Past Medical History:  Diagnosis Date  . Abdominal pain, epigastric 03/31/2009  . Acute on chronic systolic heart failure ([place]) 03/02/2009  . ANEMIA, OTHER UNSPEC 04/03/2008  . 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 Blood transfusion without reported diagnosis   . CHF (congestive heart failure) ([place])   . CORONARY ARTERY DISEASE 04/25/2007  . DIABETES MELLITUS, TYPE II 12/13/2008   no meds, diet contolled  . GERD (gastroesophageal reflux disease)   . HYPERLIPIDEMIA 04/25/2007  . Hypertension   . HYPERTENSION, HX OF 05/14/2008  . 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  . MYOCARDIAL INFARCTION, 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  . OBESITY 02/26/2009  . Thyroid disease   . THYROID DISEASE, HX OF 05/14/2008  . VENTRICULAR TACHYCARDIA 02/26/2009   Patient Active Problem List   Diagnosis Date Noted  . Immunosuppressed status ([place]) 02/23/2019    Priority: High  . Heart transplanted ([place]) 06/19/2014    Priority: High  . Chronic systolic heart failure ([place]) 03/02/2009    Priority: High  . 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    Priority: High  . 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 Diabetes mellitus type II, controlled ([place]) 12/13/2008    Priority: High  . Coronary atherosclerosis 04/25/2007    Priority: High  . Chronic abdominal pain 12/27/2015    Priority: Medium  . BPH (benign prostatic hypertrophy) 02/26/2014    Priority: Medium  . Hypothyroidism 05/14/2008    Priority: Medium  . Hypertension associated with diabetes ([place]) 05/14/2008    Priority: Medium  . ANEMIA, OTHER UNSPEC 04/03/2008    Priority:  Medium  . Hyperlipidemia associated with type 2 diabetes mellitus ([place]) 04/25/2007    Priority: Medium  . GERD (gastroesophageal reflux disease) 06/19/2014    Priority: Low  . Osteoarthritis 06/19/2014    Priority: Low  . History of adenomatous polyp of colon 09/17/2013    Priority: Low   Past Surgical History:  Procedure Laterality Date  . ANGIOPLASTY     with stent  . CARDIAC ASSIST DEVICE REMOVAL    . CARDIAC CATHETERIZATION    . COLONOSCOPY    . CORONARY ARTERY BYPASS GRAFT    . 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  . LEFT VENTRICULAR ASSIST DEVICE  05/2009   unc    Family History  Problem Relation Age of Onset  . Heart disease Mother   . Lung cancer Maternal Grandfather   . Lung cancer Maternal Grandmother   . Colon cancer Neg Hx   . Esophageal cancer Neg Hx   . Prostate cancer Neg Hx   . Rectal cancer Neg Hx   . Stomach cancer Neg Hx     Medications- reviewed and updated Current Outpatient Medications  Medication Sig Dispense Refill  . amLODipine (NORVASC) 10 MG tablet Take 10 mg by mouth daily. Per [facility]    . aspirin EC 81 MG tablet Take 1 tablet (81 mg total) by mouth daily. 30 tablet 0  . atorvastatin (LIPITOR) 80 MG tablet Take 1 tablet (80 mg total) by mouth daily at 6 PM. 30 tablet 0  . chlorthalidone (HYGROTON) 25 MG tablet Take 1 tablet (25 mg total) by mouth daily. Margery Szostak  tablet 0  . cholecalciferol (VITAMIN D) 1000 units tablet Take 3 tablets (3,000 Units total) by mouth daily. 90 tablet 0  . diltiazem (TIAZAC) 180 MG 24 hr capsule Take 2 capsules (360 mg total) by mouth daily. 60 capsule 0  . ezetimibe (ZETIA) 10 MG tablet Take 1 tablet (10 mg total) by mouth daily. 90 tablet 3  . levothyroxine (SYNTHROID, LEVOTHROID) 75 MCG tablet Take 1 tablet (75 mcg total) by mouth daily before breakfast. 30 tablet 0  . magnesium oxide (MAG-OX) 400 MG tablet Take 1.5 tablets (600 mg total) by mouth 2 (two) times daily. 45 tablet 0  . metFORMIN (GLUCOPHAGE) 1000 MG tablet TAKE 1  TABLET BY MOUTH TWICE DAILY WITH MEALS 180 tablet 0  . mycophenolate (CELLCEPT) 500 MG tablet Take 1 tablet (500 mg total) by mouth 2 (two) times daily. 60 tablet 0  . tacrolimus (PROGRAF) 0.5 MG capsule Take 2 capsules (1 mg total) by mouth 2 (two) times daily. (Patient taking differently: Take by mouth 2 (two) times daily. 2 tablets in the AM 1 tablet in the PM) 120 capsule 0  . tamsulosin (FLOMAX) 0.4 MG CAPS capsule Take 0.4 mg by mouth daily.    . valsartan (DIOVAN) 160 MG tablet Take 320 mg by mouth daily.     . famotidine (PEPCID) 20 MG tablet Take 20 mg by mouth in the morning and at bedtime.    . ondansetron (ZOFRAN ODT) 4 MG disintegrating tablet Take 1 tablet (4 mg total) by mouth every 8 (eight) hours as needed for nausea or vomiting. (Patient not taking: Reported on 12/17/2020) 20 tablet 0   No current facility-administered medications for this visit.    Allergies-reviewed and updated No Known Allergies  Social History   Social History Narrative   Single. Divorced. 1 son. 1 granddaughter 2014.       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       Hobbies: time on CPU            Objective  Objective:  BP 134/68   Pulse 85   Temp (!) 97.3 F (36.3 C) (Temporal)   Wt 236 lb 3.2 oz (107.1 kg)   SpO2 99%   BMI 35.91 kg/m  Gen: NAD, resting comfortably HEENT: Mucous membranes are moist. Oropharynx normal Neck: no thyromegaly CV: RRR no murmurs rubs or gallops Lungs: CTAB no crackles, wheeze, rhonchi Abdomen: soft/nontender/nondistended/normal bowel sounds. No rebound or guarding. Umbilical hernia is stable - declines surgery referral for now Ext: no edema Skin: warm, dry Neuro: grossly normal, moves all extremities, PERRLA    Assessment and Plan  65 y.o. male presenting for annual physical.  Health Maintenance counseling: 1. Anticipatory guidance: Patient counseled regarding regular dental exams -dentures, eye exams yearly,  avoiding  smoking and second hand smoke, limiting alcohol to 2 beverages per day - rare social .   2. Risk factor reduction:  Advised patient of need for regular exercise and diet rich and fruits and vegetables to reduce risk of heart attack and stroke. Exercise- doing some walking but may get some pain in right hip or his back- has some resistance bands he could do or discussed foot pedals. Diet- weight trending up - states no excess swelling- he is trying to eat reasonably healthy- has some fluctuations between 220s and 230s- would love to see him get even lower.  Wt Readings from Last 3 Encounters:  12/17/20 236 lb 3.2 oz (107.1 kg)  06/16/20 228  lb 6.4 oz (103.6 kg)  04/14/20 224 lb (101.6 kg)  3. Immunizations/screenings/ancillary studies- shingrix he is still consdering at pharmacy, planning on next covid booster in June or July with [facility] Immunization History  Administered Date(s) Administered  . Hepatitis A, Adult 06/09/2012  . Hepatitis B 09/14/2011  . Hepatitis B, adult 05/08/2009, 09/14/2011, 06/09/2012  . HiB (PRP-OMP) 05/02/2009  . Influenza Split 07/02/2011  . Influenza Whole 06/25/2008  . Influenza, Seasonal, Injecte, Preservative Fre 08/06/2010, 06/09/2012  . Influenza,inj,Quad PF,6+ Mos 08/07/2013, 06/19/2014, 08/29/2015, 08/20/2016, 06/07/2018, 05/22/2019, 06/16/2020  . Influenza-Unspecified 06/19/2014, 06/16/2020  . PFIZER(Purple Top)SARS-COV-2 Vaccination 11/04/2019, 12/01/2019, 10/22/2020  . PPD Test 05/04/2009, 09/14/2011, 06/09/2012  . Pneumococcal Conjugate-13 07/18/2012  . Pneumococcal Polysaccharide-23 09/06/2009, 06/09/2012, 02/23/2019  . Pneumococcal-Unspecified 05/02/2009  . Td 05/02/2009  . Tdap 09/14/2011  4. Prostate cancer screening-  PSA was 0.7 recently with [facility]  Lab Results  Component Value Date   PSA 0.5 01/27/2017   PSA 0.67 03/05/2008   5. Colon cancer screening -04/25/2017 with 10-year follow-up per Dr. Loletha Carrow 6. Skin cancer screening- plans to schedule derm  visit. advised regular sunscreen use. Denies worrisome, changing, or new skin lesions.  109. Former smoker- quit smoking 1992 per his report- passed any screening requirements other than potentially AAA screen at 1- but he had this done 12/28/15 with abdominal US and no aneurysm noted 8. STD screening - not dating- no need for STD screening  Status of chronic or acute concerns   #mechanical fall- tripped over a barrier for car - scraped elbow under jacket but no other major injury- we discussed importance of avoiding falls  #Heart transplant and immunosuppressed- follows closely with [facility] and has regular follow up. Remains on mycophenolate, tacrolimus  #CAD #hyperlipidemia S: Medication: Atorvastatin 80Mg , aspirin 81 mg, zetia 10 mg  Lab Results  Component Value Date   CHOL 145 01/21/2020   HDL 29 (A) 01/21/2020   LDLCALC 88 01/21/2020   LDLDIRECT 69 02/12/2014   TRIG 183 (A) 01/21/2020   CHOLHDL 5.2 CALC 03/05/2008   A/P: LDL was below 70 with cardiology in February- team will abstract  # Diabetes S: Medication:Metformin 1000mg  twice daily Lab Results  Component Value Date   HGBA1C 5.8 06/30/2020   HGBA1C 6.1 01/21/2020   HGBA1C 6.8 04/06/2019   A/P: team will abstract but under 6.2- continue current meds  #hypothyroidism S: compliant On thyroid medication- Levothyroxine 23mcg  Lab Results  Component Value Date   TSH 1.45 01/21/2020  A/P: TSH was normal in February-continue current medication-team labs check labs  #CHF#hypertension S: medication: Valsartan 320mg , chlorthalidone 25mg , amlodipine 10mg , diltiazem 360mg  XR -has not required lasix recently  Home readings #s: home cuff remains slightly high but has run higher than our readings in past- same for patients mom with same cuff BP Readings from Last 3 Encounters:  12/17/20 134/68  06/16/20 130/70  04/14/20 (!) 141/69  A/P: reasonable control in office- continue current meds- has not had AM meds even  #BPH-  tamsulosin remains somewhat helpful  #GERD- controlled with pepcid  Recommended follow up: No follow-ups on file. Future Appointments  Date Time Provider [place]  04/01/2021  1:30 PM [facility] CCM PHARMACIST [facility]    Lab/Order associations: fasting   ICD-10-CM   1. Preventative health care  Z00.00   2. Hypertension associated with diabetes ([place])  E11.59    I15.2   3. Gastroesophageal reflux disease without esophagitis  K21.9   4. Hypothyroidism due to acquired atrophy of  thyroid  E03.4   5. Hyperlipidemia associated with type 2 diabetes mellitus (H[facility])  E11.69    E78.5   6. Controlled type 2 diabetes mellitus without complication, without long-term current use of insulin ([facility])  E11.9   7. High risk medication use  Z79.899 CBC with Differential/Platelet    Comprehensive metabolic panel    Magnesium    Tacrolimus level  8. Aftercare following organ transplant  Z48.298 CBC with Differential/Platelet    Comprehensive metabolic panel    Magnesium    Tacrolimus level  9. Hypouricemia  R79.89   10. Other specified abnormal findings of blood chemistry  R79.89 CBC with Differential/Platelet    Comprehensive metabolic panel    Magnesium    Tacrolimus level  11. Heart replaced by transplant ([place])  Z94.1 CBC with Differential/Platelet    Comprehensive metabolic panel    Magnesium    Tacrolimus level    No orders of the defined types were placed in this encounter.   Return precautions advised.  Garret Reddish, MD

## 2020-12-17 ENCOUNTER — Ambulatory Visit (INDEPENDENT_AMBULATORY_CARE_PROVIDER_SITE_OTHER): Payer: Medicare HMO | Admitting: Family Medicine

## 2020-12-17 ENCOUNTER — Other Ambulatory Visit: Payer: Self-pay

## 2020-12-17 ENCOUNTER — Encounter: Payer: Self-pay | Admitting: Family Medicine

## 2020-12-17 VITALS — BP 134/68 | HR 85 | Temp 97.3°F | Ht 68.0 in | Wt 236.2 lb

## 2020-12-17 DIAGNOSIS — Z0000 Encounter for general adult medical examination without abnormal findings: Secondary | ICD-10-CM | POA: Diagnosis not present

## 2020-12-17 DIAGNOSIS — I110 Hypertensive heart disease with heart failure: Secondary | ICD-10-CM | POA: Diagnosis not present

## 2020-12-17 DIAGNOSIS — E119 Type 2 diabetes mellitus without complications: Secondary | ICD-10-CM

## 2020-12-17 DIAGNOSIS — E1159 Type 2 diabetes mellitus with other circulatory complications: Secondary | ICD-10-CM | POA: Diagnosis not present

## 2020-12-17 DIAGNOSIS — Z79899 Other long term (current) drug therapy: Secondary | ICD-10-CM

## 2020-12-17 DIAGNOSIS — Z941 Heart transplant status: Secondary | ICD-10-CM | POA: Diagnosis not present

## 2020-12-17 DIAGNOSIS — R350 Frequency of micturition: Secondary | ICD-10-CM

## 2020-12-17 DIAGNOSIS — Z48298 Encounter for aftercare following other organ transplant: Secondary | ICD-10-CM | POA: Diagnosis not present

## 2020-12-17 DIAGNOSIS — E1169 Type 2 diabetes mellitus with other specified complication: Secondary | ICD-10-CM | POA: Diagnosis not present

## 2020-12-17 DIAGNOSIS — I152 Hypertension secondary to endocrine disorders: Secondary | ICD-10-CM

## 2020-12-17 DIAGNOSIS — R7989 Other specified abnormal findings of blood chemistry: Secondary | ICD-10-CM | POA: Diagnosis not present

## 2020-12-17 DIAGNOSIS — I509 Heart failure, unspecified: Secondary | ICD-10-CM | POA: Diagnosis not present

## 2020-12-17 DIAGNOSIS — K219 Gastro-esophageal reflux disease without esophagitis: Secondary | ICD-10-CM

## 2020-12-17 DIAGNOSIS — E034 Atrophy of thyroid (acquired): Secondary | ICD-10-CM | POA: Diagnosis not present

## 2020-12-17 DIAGNOSIS — E785 Hyperlipidemia, unspecified: Secondary | ICD-10-CM

## 2020-12-17 LAB — POC URINALSYSI DIPSTICK (AUTOMATED)
Bilirubin, UA: NEGATIVE
Blood, UA: NEGATIVE
Glucose, UA: NEGATIVE
Ketones, UA: NEGATIVE
Leukocytes, UA: NEGATIVE
Nitrite, UA: NEGATIVE
Protein, UA: NEGATIVE
Spec Grav, UA: 1.015 (ref 1.010–1.025)
Urobilinogen, UA: 0.2 E.U./dL
pH, UA: 6 (ref 5.0–8.0)

## 2020-12-17 LAB — CBC WITH DIFFERENTIAL/PLATELET
Basophils Absolute: 0.1 10*3/uL (ref 0.0–0.1)
Basophils Relative: 0.6 % (ref 0.0–3.0)
Eosinophils Absolute: 0.2 10*3/uL (ref 0.0–0.7)
Eosinophils Relative: 2 % (ref 0.0–5.0)
HCT: 40.2 % (ref 39.0–52.0)
Hemoglobin: 13.2 g/dL (ref 13.0–17.0)
Lymphocytes Relative: 18.4 % (ref 12.0–46.0)
Lymphs Abs: 1.7 10*3/uL (ref 0.7–4.0)
MCHC: 32.9 g/dL (ref 30.0–36.0)
MCV: 89.1 fl (ref 78.0–100.0)
Monocytes Absolute: 0.6 10*3/uL (ref 0.1–1.0)
Monocytes Relative: 6.5 % (ref 3.0–12.0)
Neutro Abs: 6.6 10*3/uL (ref 1.4–7.7)
Neutrophils Relative %: 72.5 % (ref 43.0–77.0)
Platelets: 263 10*3/uL (ref 150.0–400.0)
RBC: 4.51 Mil/uL (ref 4.22–5.81)
RDW: 13.9 % (ref 11.5–15.5)
WBC: 9.1 10*3/uL (ref 4.0–10.5)

## 2020-12-17 LAB — COMPREHENSIVE METABOLIC PANEL
ALT: 16 U/L (ref 0–53)
AST: 12 U/L (ref 0–37)
Albumin: 4.3 g/dL (ref 3.5–5.2)
Alkaline Phosphatase: 79 U/L (ref 39–117)
BUN: 24 mg/dL — ABNORMAL HIGH (ref 6–23)
CO2: 24 mEq/L (ref 19–32)
Calcium: 9.7 mg/dL (ref 8.4–10.5)
Chloride: 103 mEq/L (ref 96–112)
Creatinine, Ser: 1.35 mg/dL (ref 0.40–1.50)
GFR: 55.68 mL/min — ABNORMAL LOW (ref >60.00)
Glucose, Bld: 111 mg/dL — ABNORMAL HIGH (ref 70–99)
Potassium: 4.5 mEq/L (ref 3.5–5.1)
Sodium: 135 mEq/L (ref 135–145)
Total Bilirubin: 0.8 mg/dL (ref 0.2–1.2)
Total Protein: 7.1 g/dL (ref 6.0–8.3)

## 2020-12-17 LAB — MAGNESIUM: Magnesium: 1.6 mg/dL (ref 1.5–2.5)

## 2020-12-17 MED FILL — TACROLIMUS 0.5 MG CAPSULE, IMMEDIATE-RELEASE: ORAL | 30 days supply | Qty: 90 | Fill #9

## 2020-12-17 MED FILL — LEVOTHYROXINE 75 MCG TABLET: ORAL | 30 days supply | Qty: 30 | Fill #5

## 2020-12-17 MED FILL — MYCOPHENOLATE MOFETIL 500 MG TABLET: ORAL | 30 days supply | Qty: 60 | Fill #4

## 2020-12-17 MED FILL — CHLORTHALIDONE 25 MG TABLET: ORAL | 30 days supply | Qty: 30 | Fill #5

## 2020-12-17 MED FILL — DILTIAZEM CD 180 MG CAPSULE,EXTENDED RELEASE 24 HR: ORAL | 30 days supply | Qty: 60 | Fill #5

## 2020-12-17 NOTE — Addendum Note (Signed)
Addended by: Jacob Moores on: 12/17/2020 11:51 AM   Modules accepted: Orders

## 2020-12-18 LAB — TACROLIMUS LEVEL: Tacrolimus (FK506), Blood: 6.6 ng/mL

## 2020-12-22 NOTE — Unmapped (Signed)
Discussed recent labs with Edgar Frisk, PharmD.  Plan is to Make No Changes with repeat labs in 3 Months.    Secure MyChart message sent to the patient.      Lab Results   Component Value Date    TACROLIMUS 6.6 12/17/2020     Goal: Tac: 5-8  Current Dose: Tac: 1 mg in the AM/0.5 mg in the PM    Lab Results   Component Value Date    BUN 24 (H) 12/17/2020    CREATININE 1.35 12/17/2020    K 4.5 12/17/2020    GLU 111 (H) 12/17/2020    MG 1.6 12/17/2020     Lab Results   Component Value Date    WBC 9.1 12/17/2020    HGB 13.2 12/17/2020    HCT 40.2 12/17/2020    PLT 263.0 12/17/2020    NEUTROABS 6.6 12/17/2020    EOSABS 0.2 12/17/2020

## 2021-01-13 NOTE — Unmapped (Signed)
[facility] Refill Coordination Note     Specialty Medication(s) to be Shipped:   Transplant: mycophenolate mofetil 500mg  and tacrolimus 0.5mg     Other medication(s) to be shipped: chlorthalidone, diltiazem, levothyroxine, valsartan     Andrew Dickerson, DOB: Jan 15, 1956  Phone: There are no phone numbers on file.      All above HIPAA information was verified with patient.     Was a Nurse, learning disability used for this call? No    Completed refill call assessment today to schedule patient's medication shipment from the [facility] [phone]).  All relevant notes have been reviewed.     Specialty medication(s) and dose(s) confirmed: Regimen is correct and unchanged.   Changes to medications: [place] reports no changes at this time.  Changes to insurance: No  New side effects reported not previously addressed with a pharmacist or physician: None reported  Questions for the pharmacist: No    Confirmed patient received a Conservation officer, historic buildings and a Surveyor, mining with first shipment. The patient will receive a drug information handout for each medication shipped and additional FDA Medication Guides as required.       DISEASE/MEDICATION-SPECIFIC INFORMATION        N/A    SPECIALTY MEDICATION ADHERENCE     Medication Adherence    Patient reported X missed doses in the last month: 0  Specialty Medication: Tacrolimus 0.5mg   Patient is on additional specialty medications: Yes  Additional Specialty Medications: Mycophenolate 500mg   Patient Reported Additional Medication X Missed Doses in the Last Month: 0  Patient is on more than two specialty medications: No              Were doses missed due to medication being on hold? No    Mycophenolate 500 mg: 10 days of medicine on hand   Tacrolimus 0.5 mg: 10 days of medicine on hand       REFERRAL TO PHARMACIST     Referral to the pharmacist: Not needed      [facility]     Shipping address confirmed in Epic.     Delivery Scheduled: Yes, Expected medication delivery date: 01/20/21.     Medication will be delivered via UPS to the prescription address in Epic [facility].    [name]   [facility] Specialty Pharmacist

## 2021-01-19 MED FILL — CHLORTHALIDONE 25 MG TABLET: ORAL | 30 days supply | Qty: 30 | Fill #6

## 2021-01-19 MED FILL — MYCOPHENOLATE MOFETIL 500 MG TABLET: ORAL | 30 days supply | Qty: 60 | Fill #5

## 2021-01-19 MED FILL — VALSARTAN 160 MG TABLET: ORAL | 90 days supply | Qty: 180 | Fill #1

## 2021-01-19 MED FILL — DILTIAZEM CD 180 MG CAPSULE,EXTENDED RELEASE 24 HR: ORAL | 30 days supply | Qty: 60 | Fill #6

## 2021-01-19 MED FILL — LEVOTHYROXINE 75 MCG TABLET: ORAL | 30 days supply | Qty: 30 | Fill #6

## 2021-01-19 MED FILL — TACROLIMUS 0.5 MG CAPSULE, IMMEDIATE-RELEASE: ORAL | 30 days supply | Qty: 90 | Fill #10

## 2021-02-04 ENCOUNTER — Telehealth: Payer: Self-pay

## 2021-02-04 NOTE — Chronic Care Management (AMB) (Signed)
Chronic Care Management Pharmacy Assistant   Name: [name]  MRN: [id number] DOB: Feb 09, 1956  Reason for Encounter: Diabetes Mellitus Disease State Call  Recent office visits:  12/17/20- Kevin Reddish, MD- seen for annual exam, no medication changes, no follow up documented  Recent consult visits:  No visits noted  Hospital visits:  None in previous 6 months  Medications: Outpatient Encounter Medications as of 02/04/2021  Medication Sig  . amLODipine (NORVASC) 10 MG tablet Take 10 mg by mouth daily. Per [facility]  . aspirin EC 81 MG tablet Take 1 tablet (81 mg total) by mouth daily.  [name] atorvastatin (LIPITOR) 80 MG tablet Take 1 tablet (80 mg total) by mouth daily at 6 PM.  . chlorthalidone (HYGROTON) 25 MG tablet Take 1 tablet (25 mg total) by mouth daily.  . cholecalciferol (VITAMIN D) 1000 units tablet Take 3 tablets (3,000 Units total) by mouth daily.  [name] diltiazem (TIAZAC) 180 MG 24 hr capsule Take 2 capsules (360 mg total) by mouth daily.  [name] ezetimibe (ZETIA) 10 MG tablet Take 1 tablet (10 mg total) by mouth daily.  . famotidine (PEPCID) 20 MG tablet Take 20 mg by mouth in the morning and at bedtime.  [name] levothyroxine (SYNTHROID, LEVOTHROID) 75 MCG tablet Take 1 tablet (75 mcg total) by mouth daily before breakfast.  . magnesium oxide (MAG-OX) 400 MG tablet Take 1.5 tablets (600 mg total) by mouth 2 (two) times daily.  . metFORMIN (GLUCOPHAGE) 1000 MG tablet TAKE 1 TABLET BY MOUTH TWICE DAILY WITH MEALS  . mycophenolate (CELLCEPT) 500 MG tablet Take 1 tablet (500 mg total) by mouth 2 (two) times daily.  . ondansetron (ZOFRAN ODT) 4 MG disintegrating tablet Take 1 tablet (4 mg total) by mouth every 8 (eight) hours as needed for nausea or vomiting. (Patient not taking: Reported on 12/17/2020)  . tacrolimus (PROGRAF) 0.5 MG capsule Take 2 capsules (1 mg total) by mouth 2 (two) times daily. (Patient taking differently: Take by mouth 2 (two) times daily. 2 tablets in the AM 1  tablet in the PM)  . tamsulosin (FLOMAX) 0.4 MG CAPS capsule Take 0.4 mg by mouth daily.  . valsartan (DIOVAN) 160 MG tablet Take 320 mg by mouth daily.    No facility-administered encounter medications on file as of 02/04/2021.    Recent Relevant Labs: Lab Results  Component Value Date/Time   HGBA1C 5.9 10/22/2020 12:00 AM   HGBA1C 5.8 06/30/2020 12:00 AM    Kidney Function Lab Results  Component Value Date/Time   CREATININE 1.35 12/17/2020 11:33 AM   CREATININE 1.5 (A) 10/22/2020 12:00 AM   CREATININE 1.15 01/23/2016 03:27 PM   GFR 55.68 (L) 12/17/2020 11:33 AM   GFRNONAA 48 10/22/2020 12:00 AM   GFRAA 56 10/22/2020 12:00 AM   I spoke with [name] today and he is doing well overall. He has not been doing much since the pandemic and says the pandemic made him a bit lazy. However, [name] stated that he believes that he will begin walking soon because it is time. We also spoke about his diet which consists of mostly processed and fast food like McDonalds and pizza. He is a bit concerned about his rising BS reading and admitted to an increase in his sugar intake. He doesn't believe that the amount of cake and candy he's been having lately is related to his elevated BS. He informed me that this sudden, inexplicable increase has happened before and led him to be on  insulin. [name] would like some advice as insulin therapy is something he would like to avoid. He confirmed that he takes metformin twice daily and will be calling [facility] as he is due for a refill. All of his other medications are taken as prescribed and he has no other concerns or complaints.   Current antihyperglycemic regimen:  Metformin 1000 mg twice daily with meals   What recent interventions/DTPs have been made to improve glycemic control:   No recent interventions   Have there been any recent hospitalizations or ED visits since last visit with CPP? No  Patient denies hypoglycemic symptoms Patient denies  hyperglycemic symptoms  How often are you checking your blood sugar?  Patient stated he checks his BS daily   What are your blood sugars ranging?  o Fasting: 120,149,157,208,160 o Before meals: n/a o After meals:n/a  o Bedtime: n/a  During the week, how often does your blood glucose drop below 70? Never  Are you checking your feet daily/regularly?  Patient stated his feet are checked regularly and they look good   Adherence Review: Is the patient currently on a STATIN medication? Yes Is the patient currently on ACE/ARB medication? Yes  Does the patient have >5 day gap between last estimated fill dates? Yes Metformin 1000 mg- 90 DS last filled 10/24/20   Star Rating Drugs: Atorvastatin 80 mg- 90 DS last filled 12/17/20 Valsartan 160 mg- 90 DS last filled 10/13/20 Patient recently received from [place], [place]

## 2021-02-15 ENCOUNTER — Other Ambulatory Visit: Payer: Self-pay | Admitting: Family Medicine

## 2021-02-15 DIAGNOSIS — R7309 Other abnormal glucose: Secondary | ICD-10-CM

## 2021-02-18 MED ORDER — ACCU-CHEK AVIVA PLUS TEST STRIPS
ORAL_STRIP | 11 refills | 0 days
Start: 2021-02-18 — End: 2022-02-18

## 2021-02-18 NOTE — Unmapped (Signed)
Refill request received for patient.      Medication Requested: Blood sugar diagnostic strips  Last Office Visit: 03/25/2020   Next Office Visit: Visit date not found  Last Prescriber: Unknown    Nurse refill requirements met? No  If not met, why: Not cardiac related    Sent to: Provider for signing  If sent to provider, which provider?: Dr. Cherly Hensen

## 2021-02-18 NOTE — Unmapped (Signed)
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      All above HIPAA information was verified with patient.     Was a Nurse, learning disability used for this call? No    Completed refill call assessment today to schedule patient's medication shipment from the [facility] [phone]).  All relevant notes have been reviewed.     Specialty medication(s) and dose(s) confirmed: Regimen is correct and unchanged.   Changes to medications: [name] reports no changes at this time.  Changes to insurance: No  New side effects reported not previously addressed with a pharmacist or physician: None reported  Questions for the pharmacist: No    Confirmed patient received a Conservation officer, historic buildings and a Surveyor, mining with first shipment. The patient will receive a drug information handout for each medication shipped and additional FDA Medication Guides as required.       DISEASE/MEDICATION-SPECIFIC INFORMATION        N/A    SPECIALTY MEDICATION ADHERENCE     Medication Adherence    Patient reported X missed doses in the last month: 0  Specialty Medication: Mycophenolate 500mg   Patient is on additional specialty medications: Yes  Additional Specialty Medications: Tacrolimus 0.5mg   Patient Reported Additional Medication X Missed Doses in the Last Month: 0  Patient is on more than two specialty medications: No              Were doses missed due to medication being on hold? No    Mycophenolate 500 mg: 7 days of medicine on hand   Tacrolimus 0.5 mg: 7 days of medicine on hand       REFERRAL TO PHARMACIST     Referral to the pharmacist: Not needed      [facility]     Shipping address confirmed in Epic.     Delivery Scheduled: Yes, Expected medication delivery date: 02/20/21.     Medication will be delivered via UPS to the prescription address in Epic WAM.    [name]   [facility] Specialty Pharmacist

## 2021-02-19 MED ORDER — ACCU-CHEK AVIVA PLUS TEST STRIPS
ORAL_STRIP | SUBCUTANEOUS | 11 refills | 0.00000 days | Status: CP
Start: 2021-02-19 — End: 2022-02-19
  Filled 2021-02-19: qty 200, 33d supply, fill #0

## 2021-02-19 MED FILL — LEVOTHYROXINE 75 MCG TABLET: ORAL | 30 days supply | Qty: 30 | Fill #7

## 2021-02-19 MED FILL — TACROLIMUS 0.5 MG CAPSULE, IMMEDIATE-RELEASE: ORAL | 30 days supply | Qty: 90 | Fill #11

## 2021-02-19 MED FILL — AMLODIPINE 10 MG TABLET: ORAL | 90 days supply | Qty: 90 | Fill #2

## 2021-02-19 MED FILL — DILTIAZEM CD 180 MG CAPSULE,EXTENDED RELEASE 24 HR: ORAL | 30 days supply | Qty: 60 | Fill #7

## 2021-02-19 MED FILL — FAMOTIDINE 20 MG TABLET: ORAL | 90 days supply | Qty: 180 | Fill #2

## 2021-02-19 MED FILL — CHLORTHALIDONE 25 MG TABLET: ORAL | 30 days supply | Qty: 30 | Fill #7

## 2021-02-19 MED FILL — MYCOPHENOLATE MOFETIL 500 MG TABLET: ORAL | 30 days supply | Qty: 60 | Fill #6

## 2021-02-19 MED FILL — ASPIRIN 81 MG TABLET,DELAYED RELEASE: ORAL | 90 days supply | Qty: 90 | Fill #2

## 2021-03-10 NOTE — Unmapped (Signed)
Called pt for appts on 9/27 at [facility].

## 2021-03-13 MED ORDER — TAMSULOSIN 0.4 MG CAPSULE
ORAL_CAPSULE | 3 refills | 0 days
Start: 2021-03-13 — End: ?

## 2021-03-16 MED ORDER — TAMSULOSIN 0.4 MG CAPSULE
ORAL_CAPSULE | 3 refills | 0 days | Status: CP
Start: 2021-03-16 — End: ?

## 2021-03-16 NOTE — Unmapped (Signed)
Refill request received for patient.      Medication Requested: Flomax  Last Office Visit: 03/25/2020   Next Office Visit: 06/02/2021  Last Prescriber: Dr. Cherly Hensen    Nurse refill requirements met? Yes  If not met, why:     Sent to: Pharmacy per protocol  If sent to provider, which provider?:

## 2021-03-24 DIAGNOSIS — Z941 Heart transplant status: Principal | ICD-10-CM

## 2021-03-24 MED ORDER — TACROLIMUS 0.5 MG CAPSULE, IMMEDIATE-RELEASE
ORAL_CAPSULE | ORAL | 11 refills | 30 days
Start: 2021-03-24 — End: ?

## 2021-03-24 NOTE — Unmapped (Signed)
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      All above HIPAA information was verified with patient.     Was a Nurse, learning disability used for this call? No    Completed refill call assessment today to schedule patient's medication shipment from the [facility] [phone]).  All relevant notes have been reviewed.     Specialty medication(s) and dose(s) confirmed: Regimen is correct and unchanged.   Changes to medications: Aaryn reports no changes at this time.  Changes to insurance: No  New side effects reported not previously addressed with a pharmacist or physician: None reported  Questions for the pharmacist: No    Confirmed patient received a Conservation officer, historic buildings and a Surveyor, mining with first shipment. The patient will receive a drug information handout for each medication shipped and additional FDA Medication Guides as required.       DISEASE/MEDICATION-SPECIFIC INFORMATION        N/A    SPECIALTY MEDICATION ADHERENCE     Medication Adherence    Patient reported X missed doses in the last month: 0  Specialty Medication: Mycophenolate 500mg   Patient is on additional specialty medications: Yes  Additional Specialty Medications: Tacrolimus 0.5mg   Patient Reported Additional Medication X Missed Doses in the Last Month: 0  Patient is on more than two specialty medications: No        Were doses missed due to medication being on hold? No    Mycophenolate 500 mg: 9 days of medicine on hand   Tacrolimus 0.5 mg: 9 days of medicine on hand     REFERRAL TO PHARMACIST     Referral to the pharmacist: Not needed      [facility]     Shipping address confirmed in Epic.     Delivery Scheduled: Yes, Expected medication delivery date: 03/31/2021.     Medication will be delivered via UPS to the prescription address in Epic WAM.    [name] [facility] Specialty Technician

## 2021-03-25 MED ORDER — TACROLIMUS 0.5 MG CAPSULE, IMMEDIATE-RELEASE
ORAL_CAPSULE | ORAL | 11 refills | 30 days | Status: CP
Start: 2021-03-25 — End: ?
  Filled 2021-03-30: qty 90, 30d supply, fill #0

## 2021-03-26 NOTE — Unmapped (Signed)
Called pt to advise of change to appt times on 9/27.

## 2021-03-30 ENCOUNTER — Telehealth: Payer: Self-pay | Admitting: Pharmacist

## 2021-03-30 MED FILL — MYCOPHENOLATE MOFETIL 500 MG TABLET: ORAL | 30 days supply | Qty: 60 | Fill #7

## 2021-03-30 MED FILL — ATORVASTATIN 80 MG TABLET: ORAL | 90 days supply | Qty: 90 | Fill #1

## 2021-03-30 MED FILL — DILTIAZEM CD 180 MG CAPSULE,EXTENDED RELEASE 24 HR: ORAL | 30 days supply | Qty: 60 | Fill #8

## 2021-03-30 MED FILL — LEVOTHYROXINE 75 MCG TABLET: ORAL | 30 days supply | Qty: 30 | Fill #8

## 2021-03-30 MED FILL — CHLORTHALIDONE 25 MG TABLET: ORAL | 30 days supply | Qty: 30 | Fill #8

## 2021-03-30 NOTE — Chronic Care Management (AMB) (Addendum)
Chronic Care Management Pharmacy Assistant   Name: [name]  MRN: [id number] DOB: 1956-01-28   Reason for Encounter: Diabetes Disease State Call    Recent office visits:  None  Recent consult visits:  None  Hospital visits:  None in previous 6 months  Medications: Outpatient Encounter Medications as of 03/30/2021  Medication Sig   amLODipine (NORVASC) 10 MG tablet Take 10 mg by mouth daily. Per [facility]   aspirin EC 81 MG tablet Take 1 tablet (81 mg total) by mouth daily.   atorvastatin (LIPITOR) 80 MG tablet Take 1 tablet (80 mg total) by mouth daily at 6 PM.   chlorthalidone (HYGROTON) 25 MG tablet Take 1 tablet (25 mg total) by mouth daily.   cholecalciferol (VITAMIN D) 1000 units tablet Take 3 tablets (3,000 Units total) by mouth daily.   diltiazem (TIAZAC) 180 MG 24 hr capsule Take 2 capsules (360 mg total) by mouth daily.   ezetimibe (ZETIA) 10 MG tablet Take 1 tablet (10 mg total) by mouth daily.   famotidine (PEPCID) 20 MG tablet Take 20 mg by mouth in the morning and at bedtime.   levothyroxine (SYNTHROID, LEVOTHROID) 75 MCG tablet Take 1 tablet (75 mcg total) by mouth daily before breakfast.   magnesium oxide (MAG-OX) 400 MG tablet Take 1.5 tablets (600 mg total) by mouth 2 (two) times daily.   metFORMIN (GLUCOPHAGE) 1000 MG tablet TAKE 1 TABLET BY MOUTH TWICE DAILY WITH MEALS   mycophenolate (CELLCEPT) 500 MG tablet Take 1 tablet (500 mg total) by mouth 2 (two) times daily.   ondansetron (ZOFRAN ODT) 4 MG disintegrating tablet Take 1 tablet (4 mg total) by mouth every 8 (eight) hours as needed for nausea or vomiting. (Patient not taking: Reported on 12/17/2020)   tacrolimus (PROGRAF) 0.5 MG capsule Take 2 capsules (1 mg total) by mouth 2 (two) times daily. (Patient taking differently: Take by mouth 2 (two) times daily. 2 tablets in the AM 1 tablet in the PM)   valsartan (DIOVAN) 160 MG tablet Take 320 mg by mouth daily.    No facility-administered encounter  medications on file as of 03/30/2021.    Recent Relevant Labs: Lab Results  Component Value Date/Time   HGBA1C 5.9 10/22/2020 12:00 AM   HGBA1C 5.8 06/30/2020 12:00 AM    Kidney Function Lab Results  Component Value Date/Time   CREATININE 1.35 12/17/2020 11:33 AM   CREATININE 1.5 (A) 10/22/2020 12:00 AM   CREATININE 1.15 01/23/2016 03:27 PM   GFR 55.68 (L) 12/17/2020 11:33 AM   GFRNONAA 48 10/22/2020 12:00 AM   GFRAA 56 10/22/2020 12:00 AM    Current antihyperglycemic regimen:  Metformin 1000 mg twice daily  What recent interventions/DTPs have been made to improve glycemic control:  None  Have there been any recent hospitalizations or ED visits since last visit with CPP? No  Patient denies hypoglycemic symptoms.  Patient denies hyperglycemic symptoms.  How often are you checking your blood sugar? once daily  What are your blood sugars ranging?  Fasting: 146, 142, 135, 140 Before meals: none After meals: none Bedtime: none  During the week, how often does your blood glucose drop below 70? Never  Are you checking your feet daily/regularly? Yes, patient states he checks his feet regularly.  Adherence Review: Is the patient currently on a STATIN medication? Yes Is the patient currently on ACE/ARB medication? Yes Does the patient have >5 day gap between last estimated fill dates? No  Patient scheduled his follow up  telephone appointment with clinical pharmacist on 04/27/2021 at 3:30 pm.  Future Appointments  Date Time Provider [address]  04/27/2021  3:30 PM [facility] CCM PHARMACIST [facility]  06/18/2021 11:00 AM Marin Olp, MD [facility]     Star Rating Drugs: Atorvastatin last filled 12/17/2020 90 DS Valsartan last filled 01/19/2021 90 DS Metformin last filled 02/16/2021 90 DS  April D Calhoun, [place] Pharmacist Assistant [phone]   10 minutes spent in review, coordination, and documentation.  Reviewed by: Beverly Milch,  PharmD Clinical Pharmacist [phone]

## 2021-04-01 ENCOUNTER — Telehealth: Payer: Medicare HMO

## 2021-04-15 NOTE — Unmapped (Signed)
Called pt to get labs drawn this week.

## 2021-04-20 NOTE — Unmapped (Signed)
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      All above HIPAA information was verified with patient.     Was a Nurse, learning disability used for this call? No    Completed refill call assessment today to schedule patient's medication shipment from the [facility] [phone]).  All relevant notes have been reviewed.     Specialty medication(s) and dose(s) confirmed: Regimen is correct and unchanged.   Changes to medications: Andrew Dickerson reports no changes at this time.  Changes to insurance: No  New side effects reported not previously addressed with a pharmacist or physician: None reported  Questions for the pharmacist: No    Confirmed patient received a Conservation officer, historic buildings and a Surveyor, mining with first shipment. The patient will receive a drug information handout for each medication shipped and additional FDA Medication Guides as required.       DISEASE/MEDICATION-SPECIFIC INFORMATION        N/A    SPECIALTY MEDICATION ADHERENCE     Medication Adherence    Patient reported X missed doses in the last month: 0  Specialty Medication: Mycophenolate 500mg   Patient is on additional specialty medications: Yes  Additional Specialty Medications: Tacrolimus 0.5mg   Patient Reported Additional Medication X Missed Doses in the Last Month: 0  Patient is on more than two specialty medications: No        Were doses missed due to medication being on hold? No    Mycophenolate 500 mg: 9 days of medicine on hand   Tacrolimus 0.5 mg: 9 days of medicine on hand     REFERRAL TO PHARMACIST     Referral to the pharmacist: Not needed      [facility]     Shipping address confirmed in Epic.     Delivery Scheduled: Yes, Expected medication delivery date: 04/28/2021.     Medication will be delivered via UPS to the prescription address in Epic WAM.    Andrew Dickerson [facility] Specialty Technician

## 2021-04-24 DIAGNOSIS — E559 Vitamin D deficiency, unspecified: Secondary | ICD-10-CM | POA: Diagnosis not present

## 2021-04-24 DIAGNOSIS — Z79899 Other long term (current) drug therapy: Secondary | ICD-10-CM | POA: Diagnosis not present

## 2021-04-24 DIAGNOSIS — R7989 Other specified abnormal findings of blood chemistry: Secondary | ICD-10-CM | POA: Diagnosis not present

## 2021-04-24 DIAGNOSIS — Z941 Heart transplant status: Secondary | ICD-10-CM | POA: Diagnosis not present

## 2021-04-24 DIAGNOSIS — E785 Hyperlipidemia, unspecified: Secondary | ICD-10-CM | POA: Diagnosis not present

## 2021-04-24 DIAGNOSIS — Z48298 Encounter for aftercare following other organ transplant: Secondary | ICD-10-CM | POA: Diagnosis not present

## 2021-04-24 LAB — COMPREHENSIVE METABOLIC PANEL
Albumin: 4.6 (ref 3.5–5.0)
Calcium: 9.7 (ref 8.7–10.7)
Globulin: 2.3

## 2021-04-24 LAB — HEPATIC FUNCTION PANEL
ALT: 16 (ref 10–40)
AST: 12 — AB (ref 14–40)
Alkaline Phosphatase: 78 (ref 25–125)
Bilirubin, Total: 0.7

## 2021-04-24 LAB — BASIC METABOLIC PANEL
BUN: 26 — AB (ref 4–21)
CO2: 19 (ref 13–22)
Chloride: 104 (ref 99–108)
Creatinine: 1.6 — AB (ref 0.6–1.3)
Glucose: 130
Potassium: 4.7 (ref 3.4–5.3)
Sodium: 136 — AB (ref 137–147)

## 2021-04-24 LAB — VITAMIN D 25 HYDROXY (VIT D DEFICIENCY, FRACTURES): Vit D, 25-Hydroxy: 46

## 2021-04-24 LAB — LIPID PANEL
Cholesterol: 124 (ref 0–200)
HDL: 33 — AB (ref 35–70)
LDL Cholesterol: 68
Triglycerides: 147 (ref 40–160)

## 2021-04-24 LAB — TSH: TSH: 1.7 (ref 0.41–5.90)

## 2021-04-24 LAB — CBC AND DIFFERENTIAL
HCT: 41 (ref 41–53)
Hemoglobin: 13.1 — AB (ref 13.5–17.5)
Platelets: 293 (ref 150–399)
WBC: 9.9

## 2021-04-24 LAB — CBC: RBC: 4.49 (ref 3.87–5.11)

## 2021-04-24 LAB — HEMOGLOBIN A1C: Hemoglobin A1C: 6

## 2021-04-27 ENCOUNTER — Ambulatory Visit (INDEPENDENT_AMBULATORY_CARE_PROVIDER_SITE_OTHER): Payer: Medicare HMO | Admitting: Pharmacist

## 2021-04-27 DIAGNOSIS — E119 Type 2 diabetes mellitus without complications: Secondary | ICD-10-CM | POA: Diagnosis not present

## 2021-04-27 DIAGNOSIS — I152 Hypertension secondary to endocrine disorders: Secondary | ICD-10-CM | POA: Diagnosis not present

## 2021-04-27 DIAGNOSIS — E1169 Type 2 diabetes mellitus with other specified complication: Secondary | ICD-10-CM

## 2021-04-27 DIAGNOSIS — E1159 Type 2 diabetes mellitus with other circulatory complications: Secondary | ICD-10-CM

## 2021-04-27 DIAGNOSIS — E785 Hyperlipidemia, unspecified: Secondary | ICD-10-CM

## 2021-04-27 MED FILL — LEVOTHYROXINE 75 MCG TABLET: ORAL | 30 days supply | Qty: 30 | Fill #9

## 2021-04-27 MED FILL — TACROLIMUS 0.5 MG CAPSULE, IMMEDIATE-RELEASE: ORAL | 30 days supply | Qty: 90 | Fill #1

## 2021-04-27 MED FILL — DILTIAZEM CD 180 MG CAPSULE,EXTENDED RELEASE 24 HR: ORAL | 30 days supply | Qty: 60 | Fill #9

## 2021-04-27 MED FILL — MYCOPHENOLATE MOFETIL 500 MG TABLET: ORAL | 30 days supply | Qty: 60 | Fill #8

## 2021-04-27 MED FILL — CHLORTHALIDONE 25 MG TABLET: ORAL | 30 days supply | Qty: 30 | Fill #9

## 2021-04-27 NOTE — Progress Notes (Signed)
Chronic Care Management Pharmacy Note  04/28/2021 Name:  [name] MRN:  [id number] DOB:  1956-03-11  Summary: PharmD follow up on chronic conditions.  Patient remains well controlled, denies and adverse effects with meds.  Discussed continued efforts on lifestyle mods and incorporating physical activity.  Recommendations/Changes made from today's visit: None at this time  Plan: 3 month DM/BP call 6 month PharmD FU   Subjective: [name] is an 65 y.o. year old male who is a primary patient of Hunter, Brayton Mars, MD.  The CCM team was consulted for assistance with disease management and care coordination needs.    Engaged with patient by telephone for follow up visit in response to provider referral for pharmacy case management and/or care coordination services.   Consent to Services:  The patient was given the following information about Chronic Care Management services today, agreed to services, and gave verbal consent: 1. CCM service includes personalized support from designated clinical staff supervised by the primary care provider, including individualized plan of care and coordination with other care providers 2. 24/7 contact phone numbers for assistance for urgent and routine care needs. 3. Service will only be billed when office clinical staff spend 20 minutes or more in a month to coordinate care. 4. Only one practitioner may furnish and bill the service in a calendar month. 5.The patient may stop CCM services at any time (effective at the end of the month) by phone call to the office staff. 6. The patient will be responsible for cost sharing (co-pay) of up to 20% of the service fee (after annual deductible is met). Patient agreed to services and consent obtained.  Patient Care Team: Marin Olp, MD as PCP - General (Family Medicine) Venida Jarvis, MD as Consulting Physician (Cardiology) Edythe Clarity, [facility] (Pharmacist)  Recent office visits:   None   Recent consult visits:  None   Hospital visits:  None in previous 6 months   Objective:  Lab Results  Component Value Date   CREATININE 1.35 12/17/2020   BUN 24 (H) 12/17/2020   GFR 55.68 (L) 12/17/2020   GFRNONAA 48 10/22/2020   GFRAA 56 10/22/2020   NA 135 12/17/2020   K 4.5 12/17/2020   CALCIUM 9.7 12/17/2020   CO2 24 12/17/2020   GLUCOSE 111 (H) 12/17/2020    Lab Results  Component Value Date/Time   HGBA1C 5.9 10/22/2020 12:00 AM   HGBA1C 5.8 06/30/2020 12:00 AM   GFR 55.68 (L) 12/17/2020 11:33 AM   GFR 69.16 01/23/2016 03:27 PM    Last diabetic Eye exam:  Lab Results  Component Value Date/Time   HMDIABEYEEXA No Retinopathy 06/20/2020 10:07 AM    Last diabetic Foot exam: No results found for: HMDIABFOOTEX   Lab Results  Component Value Date   CHOL 124 10/22/2020   HDL 33 (A) 10/22/2020   LDLCALC 61 10/22/2020   LDLDIRECT 69 02/12/2014   TRIG 149 10/22/2020   CHOLHDL 5.2 CALC 03/05/2008    Hepatic Function Latest Ref Rng & Units 12/17/2020 10/22/2020 12/29/2015  Total Protein 6.0 - 8.3 g/dL 7.1 - 6.0(L)  Albumin 3.5 - 5.2 g/dL 4.3 4.2 2.6(L)  AST 0 - 37 U/L 12 20 9(L)  ALT 0 - 53 U/L 16 23 9(L)  Alk Phosphatase 39 - 117 U/L 79 104 79  Total Bilirubin 0.2 - 1.2 mg/dL 0.8 - 0.5  Bilirubin, Direct 0.1 - 0.5 mg/dL - - <0.1(L)     CBC Latest Ref Rng &  Units 12/17/2020 10/22/2020 01/07/2016  WBC 4.0 - 10.5 K/uL 9.1 10.4 6.2  Hemoglobin 13.0 - 17.0 g/dL 13.2 14.2 12.1(L)  Hematocrit 39.0 - 52.0 % 40.2 44 35.7(L)  Platelets 150.0 - 400.0 K/uL 263.0 323 325.0    No results found for: VD25OH  Clinical ASCVD: No  The ASCVD Risk score Mikey Bussing DC Jr., et al., 2013) failed to calculate for the following reasons:   The valid total cholesterol range is 130 to 320 mg/dL    Depression screen [facility] 2/9 12/17/2020 04/14/2020 02/23/2019  Decreased Interest 0 0 0  Down, Depressed, Hopeless 0 0 0  PHQ - 2 Score 0 0 0  Altered sleeping 1 0 1  Tired, decreased energy  0 0 1  Change in appetite 0 1 0  Feeling bad or failure about yourself  0 0 0  Trouble concentrating 0 0 0  Moving slowly or fidgety/restless 0 0 0  Suicidal thoughts 0 0 0  PHQ-9 Score $RemoveBef'1 1 2  'eIifujHGTO$ Difficult doing work/chores Not difficult at all Not difficult at all Not difficult at all     Social History   Tobacco Use  Smoking Status Former   Packs/day: 1.00   Years: 20.00   Pack years: 20.00   Types: Cigarettes   Quit date: 07/02/1999   Years since quitting: 21.8  Smokeless Tobacco Never   BP Readings from Last 3 Encounters:  12/17/20 134/68  06/16/20 130/70  04/14/20 (!) 141/69   Pulse Readings from Last 3 Encounters:  12/17/20 85  06/16/20 95  04/14/20 95   Wt Readings from Last 3 Encounters:  12/17/20 236 lb 3.2 oz (107.1 kg)  06/16/20 228 lb 6.4 oz (103.6 kg)  04/14/20 224 lb (101.6 kg)   BMI Readings from Last 3 Encounters:  12/17/20 35.91 kg/m  06/16/20 34.73 kg/m  04/14/20 34.06 kg/m    Assessment/Interventions: Review of patient past medical history, allergies, medications, health status, including review of consultants reports, laboratory and other test data, was performed as part of comprehensive evaluation and provision of chronic care management services.   SDOH:  (Social Determinants of Health) assessments and interventions performed: Yes  Financial Resource Strain: Not on file    SDOH Screenings   Alcohol Screen: Not on file  Depression (PHQ2-9): Low Risk    PHQ-2 Score: 1  Financial Resource Strain: Not on file  Food Insecurity: Not on file  Housing: Not on file  Physical Activity: Not on file  Social Connections: Not on file  Stress: Not on file  Tobacco Use: Medium Risk   Smoking Tobacco Use: Former   Smokeless Tobacco Use: Never  Transportation Needs: Not on file    [place]  No Known Allergies  Medications Reviewed Today     Reviewed by Edythe Clarity, [facility] (Pharmacist) on 04/28/21 at [place] List Status: <None>    Medication Order Taking? Sig Documenting Provider Last Dose Status Informant  amLODipine (NORVASC) 10 MG tablet [id number] Yes Take 10 mg by mouth daily. Per [facility] [provider] Taking Active   aspirin EC 81 MG tablet [id number] Yes Take 1 tablet (81 mg total) by mouth daily. Marin Olp, MD Taking Active   atorvastatin (LIPITOR) 80 MG tablet [id number] Yes Take 1 tablet (80 mg total) by mouth daily at 6 PM. Marin Olp, MD Taking Active   chlorthalidone (HYGROTON) 25 MG tablet [id number] Yes Take 1 tablet (25 mg total) by mouth daily. Marin Olp, MD Taking Active  cholecalciferol (VITAMIN D) 1000 units tablet [id number] Yes Take 3 tablets (3,000 Units total) by mouth daily. Marin Olp, MD Taking Active   diltiazem [facility]) 180 MG 24 hr capsule [id number] Yes Take 2 capsules (360 mg total) by mouth daily. Marin Olp, MD Taking Active   ezetimibe (ZETIA) 10 MG tablet [id number] Yes Take 1 tablet (10 mg total) by mouth daily. Marin Olp, MD Taking Active   famotidine (PEPCID) 20 MG tablet [id number]  Take 20 mg by mouth in the morning and at bedtime. [provider]  Expired 09/02/20 2359   levothyroxine (SYNTHROID, LEVOTHROID) 75 MCG tablet [id number] Yes Take 1 tablet (75 mcg total) by mouth daily before breakfast. Marin Olp, MD Taking Active   magnesium oxide (MAG-OX) 400 MG tablet [id number] Yes Take 1.5 tablets (600 mg total) by mouth 2 (two) times daily. Marin Olp, MD Taking Active   metFORMIN (GLUCOPHAGE) 1000 MG tablet [id number] Yes TAKE 1 TABLET BY MOUTH TWICE DAILY WITH MEALS Marin Olp, MD Taking Active   mycophenolate (CELLCEPT) 500 MG tablet [id number] Yes Take 1 tablet (500 mg total) by mouth 2 (two) times daily. Marin Olp, MD Taking Active   ondansetron [facility] ODT) 4 MG disintegrating tablet [id number] Yes Take 1 tablet (4 mg total) by mouth every 8 (eight) hours as needed for nausea or vomiting. Marin Olp, MD Taking Active   tacrolimus (PROGRAF) 0.5 MG capsule [id number] Yes Take 2 capsules (1 mg total) by mouth 2 (two) times daily.  Patient taking differently: Take by mouth 2 (two) times daily. 2 tablets in the AM 1 tablet in the PM   Marin Olp, MD Taking Active   valsartan (DIOVAN) 160 MG tablet [id number] Yes Take 320 mg by mouth daily.  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/2009   Diabetes mellitus type II, controlled ([place]) 12/13/2008   Hypothyroidism 05/14/2008   Hypertension associated with diabetes ([place]) 05/14/2008   ANEMIA, OTHER UNSPEC 04/03/2008   Hyperlipidemia associated with type 2 diabetes mellitus ([place]) 04/25/2007   Coronary atherosclerosis 04/25/2007    Immunization History  Administered Date(s) Administered   Hepatitis A, Adult 06/09/2012   Hepatitis B 09/14/2011   Hepatitis B, adult 05/08/2009, 09/14/2011, 06/09/2012   HiB (PRP-OMP) 05/02/2009   Influenza Split 07/02/2011   Influenza Whole 06/25/2008   Influenza, Seasonal, Injecte, Preservative Fre 08/06/2010, 06/09/2012   Influenza,inj,Quad PF,6+ Mos 08/07/2013, 06/19/2014, 08/29/2015, 08/20/2016, 06/07/2018, 05/22/2019, 06/16/2020   Influenza-Unspecified 06/19/2014, 06/16/2020   PFIZER(Purple Top)SARS-COV-2 Vaccination 11/04/2019, 12/01/2019, 10/22/2020   PPD Test 05/04/2009, 09/14/2011, 06/09/2012   Pneumococcal Conjugate-13 07/18/2012   Pneumococcal Polysaccharide-23 09/06/2009, 06/09/2012,  02/23/2019   Pneumococcal-Unspecified 05/02/2009   Td 05/02/2009   Tdap 09/14/2011    Conditions to be addressed/monitored:  HTN, DM, HLD, Hypothyroidism  Care Plan : General Pharmacy (Adult)  Updates made by Edythe Clarity, RPH since 04/28/2021 12:00 AM     Problem: HTN, DM, HLD, Hypothyroidism   Priority: High  Onset Date:  04/28/2021     Long-Range Goal: Patient-Specific Goal   Start Date: 04/28/2021  Expected End Date: 10/29/2021  This Visit's Progress: On track  Priority: High  Note:   Current Barriers:  None at this time  Pharmacist Clinical Goal(s):  Patient will maintain control of glucose and BP as evidenced by labs/monitoring  through collaboration with PharmD and provider.   Interventions: 1:1 collaboration with Marin Olp, MD regarding development and update of comprehensive plan of care as evidenced by provider attestation and co-signature Inter-disciplinary care team collaboration (see longitudinal plan of care) Comprehensive medication review performed; medication list updated in electronic medical record  Hypertension (BP goal <130/80) -Controlled -Current treatment: Amlodipine 10mg  daily Chlorthalidone 25mg  daily Diltiazem 180mg  CD daily Valsartan 160mg  daily -Medications previously tried: none noted  -Current home readings: 143/69, 118/54, 149/67, 127/63, 132/64, 136/62 -Current dietary habits: unchanged see previous  -Denies hypotensive/hypertensive symptoms -Educated on BP goals and benefits of medications for prevention of heart attack, stroke and kidney damage; Exercise goal of 150 minutes per week; Importance of home blood pressure monitoring; Symptoms of hypotension and importance of maintaining adequate hydration; -Counseled to monitor BP at home weekly, document, and provide log at future appointments -Recommended to continue current medication  Hyperlipidemia: (LDL goal < 70) -Controlled -Current treatment: Atorvastatin 80mg   daily Zetia 10mg  daily -Medications previously tried: none noted  -Reports he just had labs drawn at [facility], last LDL was excellent. -Educated on Cholesterol goals;  Benefits of statin for ASCVD risk reduction; Importance of limiting foods high in cholesterol; -Denies any adverse effects -Recommended to continue current medication Follow up on labs from [facility]  Diabetes (A1c goal <6.5%) -Controlled -Current medications: Metformin 1000mg  BID with meals -Medications previously tried: insulin glargine  -Current home glucose readings fasting glucose: 128, 131, 150, 145, 126, 119 -Denies hypoglycemic/hyperglycemic symptoms -Educated on A1c and blood sugar goals; Prevention and management of hypoglycemic episodes; Carbohydrate counting and/or plate method -Counseled to check feet daily and get yearly eye exams -Recommended to continue current medication  Patient Goals/Self-Care Activities Patient will:  - take medications as prescribed check glucose daily, document, and provide at future appointments check blood pressure weekly, document, and provide at future appointments  Follow Up Plan: The care management team will reach out to the patient again over the next 180 days.        Medication Assistance: None required.  Patient affirms current coverage meets needs.  Compliance/Adherence/Medication fill history: Care Gaps: Needs A1c checked   Patient's preferred pharmacy is:  [facility] ([facility]), [place] - [address] [name] ([place]) [place] [address] Phone: [phone] Fax: [phone]  Uses pill box? Yes Pt endorses 100% compliance  We discussed: Benefits of medication synchronization, packaging and delivery as well as enhanced pharmacist oversight with [employer]. Patient decided to: Continue current medication management strategy  Care Plan and Follow Up Patient Decision:  Patient agrees to Care Plan and Follow-up.  Plan: The care  management team will reach out to the patient again over the next 180 days.  Beverly Milch, PharmD Clinical Pharmacist [phone]

## 2021-04-28 LAB — CBC W/ DIFFERENTIAL
BASOPHILS ABSOLUTE COUNT: 79 {cells}/uL (ref 0–200)
BASOPHILS RELATIVE PERCENT: 0.8 %
EOSINOPHILS ABSOLUTE COUNT: 158 {cells}/uL (ref 15–500)
EOSINOPHILS RELATIVE PERCENT: 1.6 %
HEMATOCRIT: 40.8 % (ref 38.5–50.0)
HEMOGLOBIN: 13.1 g/dL — ABNORMAL LOW (ref 13.2–17.1)
LYMPHOCYTES ABSOLUTE COUNT: 2079 {cells}/uL (ref 850–3900)
LYMPHOCYTES RELATIVE PERCENT: 21 %
MEAN CORPUSCULAR HEMOGLOBIN CONC: 32.1 g/dL (ref 32.0–36.0)
MEAN CORPUSCULAR HEMOGLOBIN: 29.2 pg (ref 27.0–33.0)
MEAN CORPUSCULAR VOLUME: 90.9 fL (ref 80.0–100.0)
MEAN PLATELET VOLUME: 10.2 fL (ref 7.5–12.5)
MONOCYTES ABSOLUTE COUNT: 644 {cells}/uL (ref 200–950)
MONOCYTES RELATIVE PERCENT: 6.5 %
NEUTROPHILS ABSOLUTE COUNT: 6940 {cells}/uL (ref 1500–7800)
NEUTROPHILS RELATIVE PERCENT: 70.1 %
PLATELET COUNT: 293 10*3/uL (ref 140–400)
RED BLOOD CELL COUNT: 4.49 10*6/uL (ref 4.20–5.80)
RED CELL DISTRIBUTION WIDTH: 12.7 % (ref 11.0–15.0)
WHITE BLOOD CELL COUNT: 9.9 10*3/uL (ref 3.8–10.8)

## 2021-04-28 LAB — COMPREHENSIVE METABOLIC PANEL
ALBUMIN/GLOBULIN RATIO: 2 (calc) (ref 1.0–2.5)
ALBUMIN: 4.6 g/dL (ref 3.6–5.1)
ALKALINE PHOSPHATASE: 78 U/L (ref 35–144)
ALT (SGPT): 16 U/L (ref 9–46)
AST (SGOT): 12 U/L (ref 10–35)
BILIRUBIN TOTAL: 0.7 mg/dL (ref 0.2–1.2)
BLOOD UREA NITROGEN: 26 mg/dL — ABNORMAL HIGH (ref 7–25)
BUN / CREAT RATIO: 17 (calc) (ref 6–22)
CALCIUM: 9.7 mg/dL (ref 8.6–10.3)
CHLORIDE: 104 mmol/L (ref 98–110)
CO2: 19 mmol/L — ABNORMAL LOW (ref 20–32)
CREATININE: 1.57 mg/dL — ABNORMAL HIGH (ref 0.70–1.35)
GLOBULIN, TOTAL: 2.3 g/dL (ref 1.9–3.7)
GLUCOSE RANDOM: 130 mg/dL — ABNORMAL HIGH (ref 65–99)
POTASSIUM: 4.7 mmol/L (ref 3.5–5.3)
PROTEIN TOTAL: 6.9 g/dL (ref 6.1–8.1)
SODIUM: 136 mmol/L (ref 135–146)

## 2021-04-28 LAB — LIPID PANEL
CHOLESTEROL/HDL RATIO SCREEN: 3.8 (calc)
CHOLESTEROL: 124 mg/dL
HDL CHOLESTEROL: 33 mg/dL — ABNORMAL LOW
LDL CHOLESTEROL CALCULATED: 68 mg/dL
NON-HDL CHOLESTEROL: 91 mg/dL
TRIGLYCERIDES: 147 mg/dL

## 2021-04-28 LAB — VITAMIN D 25 HYDROXY: VITAMIN D, TOTAL (25OH): 46 ng/mL

## 2021-04-28 LAB — MAGNESIUM: MAGNESIUM: 1.9 mg/dL (ref 1.5–2.5)

## 2021-04-28 LAB — TACROLIMUS LEVEL: TACROLIMUS BLOOD: 6.7 ug/L (ref 5.0–20.0)

## 2021-04-28 LAB — TSH: THYROID STIMULATING HORMONE: 1.7 m[IU]/L (ref 0.40–4.50)

## 2021-04-28 LAB — HEMOGLOBIN A1C: HEMOGLOBIN A1C: 6 % — ABNORMAL HIGH

## 2021-04-28 NOTE — Patient Instructions (Addendum)
Visit Information   Goals Addressed             This Visit's Progress    Monitor and Manage My Blood Sugar-Diabetes Type 2       Timeframe:  Long-Range Goal Priority:  High Start Date:  04/28/21                           Expected End Date:  10/29/21                     Follow Up Date 08/05/21    - check blood sugar at prescribed times - check blood sugar before and after exercise - check blood sugar if I feel it is too high or too low - take the blood sugar log to all doctor visits    Why is this important?   Checking your blood sugar at home helps to keep it from getting very high or very low.  Writing the results in a diary or log helps the doctor know how to care for you.  Your blood sugar log should have the time, date and the results.  Also, write down the amount of insulin or other medicine that you take.  Other information, like what you ate, exercise done and how you were feeling, will also be helpful.     Notes:        Patient Care Plan: General Pharmacy (Adult)     Problem Identified: HTN, DM, HLD, Hypothyroidism   Priority: High  Onset Date: 04/28/2021     Long-Range Goal: Patient-Specific Goal   Start Date: 04/28/2021  Expected End Date: 10/29/2021  This Visit's Progress: On track  Priority: High  Note:   Current Barriers:  None at this time  Pharmacist Clinical Goal(s):  Patient will maintain control of glucose and BP as evidenced by labs/monitoring  through collaboration with PharmD and provider.   Interventions: 1:1 collaboration with Marin Olp, MD regarding development and update of comprehensive plan of care as evidenced by provider attestation and co-signature Inter-disciplinary care team collaboration (see longitudinal plan of care) Comprehensive medication review performed; medication list updated in electronic medical record  Hypertension (BP goal <130/80) -Controlled -Current treatment: Amlodipine '10mg'$  daily Chlorthalidone '25mg'$   daily Diltiazem '180mg'$  CD daily Valsartan '160mg'$  daily -Medications previously tried: none noted  -Current home readings: 143/69, 118/54, 149/67, 127/63, 132/64, 136/62 -Current dietary habits: unchanged see previous  -Denies hypotensive/hypertensive symptoms -Educated on BP goals and benefits of medications for prevention of heart attack, stroke and kidney damage; Exercise goal of 150 minutes per week; Importance of home blood pressure monitoring; Symptoms of hypotension and importance of maintaining adequate hydration; -Counseled to monitor BP at home weekly, document, and provide log at future appointments -Recommended to continue current medication  Hyperlipidemia: (LDL goal < 70) -Controlled -Current treatment: Atorvastatin '80mg'$  daily Zetia '10mg'$  daily -Medications previously tried: none noted  -Reports he just had labs drawn at [facility], last LDL was excellent. -Educated on Cholesterol goals;  Benefits of statin for ASCVD risk reduction; Importance of limiting foods high in cholesterol; -Denies any adverse effects -Recommended to continue current medication Follow up on labs from [facility]  Diabetes (A1c goal <6.5%) -Controlled -Current medications: Metformin '1000mg'$  BID with meals -Medications previously tried: insulin glargine  -Current home glucose readings fasting glucose: 128, 131, 150, 145, 126, 119 -Denies hypoglycemic/hyperglycemic symptoms -Educated on A1c and blood sugar goals; Prevention and management of hypoglycemic episodes; Carbohydrate counting  and/or plate method -Counseled to check feet daily and get yearly eye exams -Recommended to continue current medication  Patient Goals/Self-Care Activities Patient will:  - take medications as prescribed check glucose daily, document, and provide at future appointments check blood pressure weekly, document, and provide at future appointments  Follow Up Plan: The care management team will reach out to the patient again  over the next 180 days.        Patient verbalizes understanding of instructions provided today and agrees to view in [place].  Telephone follow up appointment with pharmacy team member scheduled for: 6 months  Edythe Clarity, [place]

## 2021-05-01 NOTE — Unmapped (Signed)
Discussed recent labs with Edgar Frisk, PharmD.  Plan is to Make No Changes  with repeat labs in 3 Months.    Sent patient MyChart message with lab results and follow up plan.     Lab Results   Component Value Date    TACROLIMUS 6.7 04/24/2021     Goal: Tac: 5-8  Current Dose: Tacrolimus 1 mg/0.5 mg    Lab Results   Component Value Date    BUN 26 (H) 04/24/2021    CREATININE 1.57 (H) 04/24/2021    K 4.7 04/24/2021    GLU 130 (H) 04/24/2021    MG 1.9 04/24/2021     Lab Results   Component Value Date    WBC 9.9 04/24/2021    HGB 13.1 (L) 04/24/2021    HCT 40.8 04/24/2021    PLT 293 04/24/2021    NEUTROABS 6,940 04/24/2021    EOSABS 158 04/24/2021

## 2021-05-07 NOTE — Unmapped (Signed)
I called pt earlier to see if he would like to switch appts to 05/12/2021 with Dr.Chang/echo, no appointments for echos on 9/6 so pt is keeping his appt.on 9/27.

## 2021-05-11 ENCOUNTER — Other Ambulatory Visit: Payer: Self-pay | Admitting: Family Medicine

## 2021-05-11 DIAGNOSIS — R7309 Other abnormal glucose: Secondary | ICD-10-CM

## 2021-05-19 MED FILL — VALSARTAN 160 MG TABLET: ORAL | 90 days supply | Qty: 180 | Fill #2

## 2021-05-22 NOTE — Unmapped (Signed)
[facility] Clinical Assessment & Refill Coordination Note    Andrew Dickerson, DOB: 04-13-1956  Phone: There are no phone numbers on file.    All above HIPAA information was verified with patient.     Was a Nurse, learning disability used for this call? 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 amLODIPine (NORVASC) 10 MG tablet Take 1 tablet (10 mg total) by mouth daily. 90 tablet 3   ??? aspirin (ADULT LOW DOSE ASPIRIN) 81 MG tablet Take 1 tablet (81 mg total) by mouth daily. 90 tablet 3   ??? atorvastatin (LIPITOR) 80 MG tablet Take 1 tablet (80 mg total) by mouth daily. 90 tablet 3   ??? blood sugar diagnostic (ACCU-CHEK AVIVA PLUS TEST STRP) Strp CHECK BLOOD SUGAR BEFORE MEALS, AT BEDTIME, & AS NEEDED FOR HYPERGLYCEMIA UP TO 6 TIMES A DAY 200 strip 11   ??? chlorthalidone (HYGROTON) 25 MG tablet Take 1 tablet (25 mg total) by mouth daily. 30 tablet 11   ??? cholecalciferol, vitamin D3, 1,000 unit capsule Take 3,000 Units by mouth daily.     ??? dilTIAZem (CARDIZEM CD) 180 MG 24 hr capsule Take 2 capsules (360 mg total) by mouth daily. 60 capsule 11   ??? ezetimibe (ZETIA) 10 mg tablet Take 10 mg by mouth daily.     ??? famotidine (PEPCID) 20 MG tablet Take 1 tablet (20 mg total) by mouth Two (2) times a day. 180 tablet 3   ??? levothyroxine (SYNTHROID) 75 MCG tablet Take 1 tablet (75 mcg total) by mouth daily. 30 tablet 11   ??? metFORMIN (GLUCOPHAGE) 1000 MG tablet Take 1,000 mg by mouth Two (2) times a day.     ??? mycophenolate (CELLCEPT) 500 mg tablet Take 1 tablet (500 mg total) by mouth Two (2) times a day. 180 tablet 3   ??? tacrolimus (PROGRAF) 0.5 MG capsule Take 2 capsules (1 mg total) by mouth daily AND 1 capsule (0.5 mg total) nightly. 90 capsule 11   ??? tamsulosin (FLOMAX) 0.4 mg capsule TAKE 1 CAPSULE EVERY DAY 90 capsule 3   ??? valsartan (DIOVAN) 160 MG tablet Take 2 tablets (320 mg total) by mouth daily. 180 tablet 3     No current facility-administered medications for this visit.        Changes to medications: Andrew Dickerson reports no changes at this time.    No Known Allergies    Changes to allergies: No    SPECIALTY MEDICATION ADHERENCE     Tacrolimus 0.5mg : 10 days of medicine on hand   Mycophenolate 500mg   : 10 days of medicine on hand       Medication Adherence    Patient reported X missed doses in the last month: 1  Specialty Medication: tacrolimus 0.5mg   Patient is on additional specialty medications: Yes  Additional Specialty Medications: Mycophenolate 500mg   Patient Reported Additional Medication X Missed Doses in the Last Month: 1          Specialty medication(s) dose(s) confirmed: Regimen is correct and unchanged.     Are there any concerns with adherence? missed 1 pm dose, forgot. we talked of importance of adherence and using pillbox and alarm- pt says uses both already    Adherence counseling provided? see above    CLINICAL MANAGEMENT AND INTERVENTION      Clinical Benefit Assessment:  Do you feel the medicine is effective or helping your condition? Yes    Clinical Benefit counseling provided? Not needed    Adverse Effects Assessment:    Are you experiencing any side effects? No    Are you experiencing difficulty administering your medicine? No    Quality of Life Assessment:         How many days over the past month did your transplant  keep you from your normal activities? For example, brushing your teeth or getting up in the morning. 0    Have you discussed this with your provider? Not needed    Acute Infection Status:    Acute infections noted within Epic:  No active infections  Patient reported infection: None    Therapy Appropriateness:    Is therapy appropriate? Yes, therapy is appropriate and should be continued    DISEASE/MEDICATION-SPECIFIC INFORMATION      N/A    PATIENT SPECIFIC NEEDS     - Does the patient have any physical, cognitive, or cultural barriers? No    - Is the patient high risk? Yes, patient is taking a REMS drug. Medication is dispensed in compliance with REMS program    - Does the patient require a Care Management Plan? No     - Does the patient require physician intervention or other additional services (i.e. nutrition, smoking cessation, social work)? No      SHIPPING     Specialty Medication(s) to be Shipped:   Transplant: mycophenolate mofetil 500mg  and tacrolimus 0.5mg     Other medication(s) to be shipped: norvasc, aspirin, hygroton, cardizem, pepcid, levothyroxine     Changes to insurance: No    Delivery Scheduled: Yes, Expected medication delivery date: 05/27/2021.     Medication will be delivered via UPS to the confirmed prescription address in [facility].    The patient will receive a drug information handout for each medication shipped and additional FDA Medication Guides as required.  Verified that patient has previously received a Conservation officer, historic buildings and a Surveyor, mining.    The patient or caregiver noted above participated in the development of this care plan and knows that they can request review of or adjustments to the care plan at any time.      All of the patient's questions and concerns have been addressed.    Andrew Dickerson   [facility] Specialty Pharmacist

## 2021-05-26 MED FILL — ASPIRIN 81 MG TABLET,DELAYED RELEASE: ORAL | 90 days supply | Qty: 90 | Fill #3

## 2021-05-26 MED FILL — LEVOTHYROXINE 75 MCG TABLET: ORAL | 30 days supply | Qty: 30 | Fill #10

## 2021-05-26 MED FILL — AMLODIPINE 10 MG TABLET: ORAL | 90 days supply | Qty: 90 | Fill #3

## 2021-05-26 MED FILL — MYCOPHENOLATE MOFETIL 500 MG TABLET: ORAL | 30 days supply | Qty: 60 | Fill #9

## 2021-05-26 MED FILL — DILTIAZEM CD 180 MG CAPSULE,EXTENDED RELEASE 24 HR: ORAL | 30 days supply | Qty: 60 | Fill #10

## 2021-05-26 MED FILL — FAMOTIDINE 20 MG TABLET: ORAL | 90 days supply | Qty: 180 | Fill #3

## 2021-05-26 MED FILL — CHLORTHALIDONE 25 MG TABLET: ORAL | 30 days supply | Qty: 30 | Fill #10

## 2021-05-26 MED FILL — TACROLIMUS 0.5 MG CAPSULE, IMMEDIATE-RELEASE: ORAL | 30 days supply | Qty: 90 | Fill #2

## 2021-06-02 ENCOUNTER — Ambulatory Visit: Admit: 2021-06-02 | Discharge: 2021-06-02 | Payer: MEDICARE

## 2021-06-02 ENCOUNTER — Ambulatory Visit
Admit: 2021-06-02 | Discharge: 2021-06-02 | Payer: MEDICARE | Attending: Cardiovascular Disease | Primary: Cardiovascular Disease

## 2021-06-02 DIAGNOSIS — I10 Essential (primary) hypertension: Principal | ICD-10-CM

## 2021-06-02 DIAGNOSIS — Z941 Heart transplant status: Principal | ICD-10-CM

## 2021-06-02 DIAGNOSIS — E038 Other specified hypothyroidism: Principal | ICD-10-CM

## 2021-06-02 DIAGNOSIS — N400 Enlarged prostate without lower urinary tract symptoms: Principal | ICD-10-CM

## 2021-06-02 DIAGNOSIS — E0800 Diabetes mellitus due to underlying condition with hyperosmolarity without nonketotic hyperglycemic-hyperosmolar coma (NKHHC): Principal | ICD-10-CM

## 2021-06-02 DIAGNOSIS — K429 Umbilical hernia without obstruction or gangrene: Principal | ICD-10-CM

## 2021-06-02 DIAGNOSIS — E782 Mixed hyperlipidemia: Principal | ICD-10-CM

## 2021-06-02 DIAGNOSIS — K439 Ventral hernia without obstruction or gangrene: Principal | ICD-10-CM

## 2021-06-02 DIAGNOSIS — Z886 Allergy status to analgesic agent status: Secondary | ICD-10-CM | POA: Diagnosis not present

## 2021-06-02 DIAGNOSIS — N401 Enlarged prostate with lower urinary tract symptoms: Secondary | ICD-10-CM | POA: Diagnosis not present

## 2021-06-02 DIAGNOSIS — Z09 Encounter for follow-up examination after completed treatment for conditions other than malignant neoplasm: Secondary | ICD-10-CM | POA: Diagnosis not present

## 2021-06-02 DIAGNOSIS — E119 Type 2 diabetes mellitus without complications: Secondary | ICD-10-CM | POA: Diagnosis not present

## 2021-06-02 DIAGNOSIS — E669 Obesity, unspecified: Secondary | ICD-10-CM | POA: Diagnosis not present

## 2021-06-02 DIAGNOSIS — Z792 Long term (current) use of antibiotics: Secondary | ICD-10-CM | POA: Diagnosis not present

## 2021-06-02 DIAGNOSIS — R3915 Urgency of urination: Secondary | ICD-10-CM | POA: Diagnosis not present

## 2021-06-02 DIAGNOSIS — Z87891 Personal history of nicotine dependence: Secondary | ICD-10-CM | POA: Diagnosis not present

## 2021-06-02 NOTE — Unmapped (Addendum)
-   Your echo looks good    - Please have the bivalent Covid Vaccine at a local pharmacy     - You should get the Shingrix vaccine at the local pharmacy ( two dose series)    - You had the flu shot today     - I would recommend your schedule appt with Dermatology     - We will arrange for an abdominal CT to evaluate the pain in your abdomen    - Labs in November ( every 3 months). Last labs in August     - We will plan for a Left heart Catheterization in 12/2020     - We will see you back in clinic with Dr Cherly Hensen in 1 year (Fall 2023)      Melene Muller  RN, BSN, [facility]- Heart Transplant Coordinator  [facility]  [address]  [place], [place] [address]  p [phone]

## 2021-06-04 DIAGNOSIS — K439 Ventral hernia without obstruction or gangrene: Principal | ICD-10-CM

## 2021-06-04 DIAGNOSIS — Z941 Heart transplant status: Secondary | ICD-10-CM | POA: Diagnosis not present

## 2021-06-05 ENCOUNTER — Ambulatory Visit (INDEPENDENT_AMBULATORY_CARE_PROVIDER_SITE_OTHER): Payer: Medicare HMO | Admitting: Cardiology

## 2021-06-05 ENCOUNTER — Encounter: Payer: Self-pay | Admitting: Cardiology

## 2021-06-05 DIAGNOSIS — Z0000 Encounter for general adult medical examination without abnormal findings: Secondary | ICD-10-CM

## 2021-06-05 NOTE — Patient Instructions (Signed)
Health Maintenance, Male Adopting a healthy lifestyle and getting preventive care are important in promoting health and wellness. Ask your health care provider about: The right schedule for you to have regular tests and exams. Things you can do on your own to prevent diseases and keep yourself healthy. What should I know about diet, weight, and exercise? Eat a healthy diet  Eat a diet that includes plenty of vegetables, fruits, low-fat dairy products, and lean protein. Do not eat a lot of foods that are high in solid fats, added sugars, or sodium. Maintain a healthy weight Body mass index (BMI) is a measurement that can be used to identify possible weight problems. It estimates body fat based on height and weight. Your health care provider can help determine your BMI and help you achieve or maintain a healthy weight. Get regular exercise Get regular exercise. This is one of the most important things you can do for your health. Most adults should: Exercise for at least 150 minutes each week. The exercise should increase your heart rate and make you sweat (moderate-intensity exercise). Do strengthening exercises at least twice a week. This is in addition to the moderate-intensity exercise. Spend less time sitting. Even light physical activity can be beneficial. Watch cholesterol and blood lipids Have your blood tested for lipids and cholesterol at 64 years of age, then have this test every 5 years. You may need to have your cholesterol levels checked more often if: Your lipid or cholesterol levels are high. You are older than 64 years of age. You are at high risk for heart disease. What should I know about cancer screening? Many types of cancers can be detected early and may often be prevented. Depending on your health history and family history, you may need to have cancer screening at various ages. This may include screening for: Colorectal cancer. Prostate cancer. Skin cancer. Lung  cancer. What should I know about heart disease, diabetes, and high blood pressure? Blood pressure and heart disease High blood pressure causes heart disease and increases the risk of stroke. This is more likely to develop in people who have high blood pressure readings, are of African descent, or are overweight. Talk with your health care provider about your target blood pressure readings. Have your blood pressure checked: Every 3-5 years if you are 18-39 years of age. Every year if you are 40 years old or older. If you are between the ages of 65 and 75 and are a current or former smoker, ask your health care provider if you should have a one-time screening for abdominal aortic aneurysm (AAA). Diabetes Have regular diabetes screenings. This checks your fasting blood sugar level. Have the screening done: Once every three years after age 45 if you are at a normal weight and have a low risk for diabetes. More often and at a younger age if you are overweight or have a high risk for diabetes. What should I know about preventing infection? Hepatitis B If you have a higher risk for hepatitis B, you should be screened for this virus. Talk with your health care provider to find out if you are at risk for hepatitis B infection. Hepatitis C Blood testing is recommended for: Everyone born from 1945 through 1965. Anyone with known risk factors for hepatitis C. Sexually transmitted infections (STIs) You should be screened each year for STIs, including gonorrhea and chlamydia, if: You are sexually active and are younger than 64 years of age. You are older than 64 years   of age and your health care provider tells you that you are at risk for this type of infection. Your sexual activity has changed since you were last screened, and you are at increased risk for chlamydia or gonorrhea. Ask your health care provider if you are at risk. Ask your health care provider about whether you are at high risk for HIV.  Your health care provider may recommend a prescription medicine to help prevent HIV infection. If you choose to take medicine to prevent HIV, you should first get tested for HIV. You should then be tested every 3 months for as long as you are taking the medicine. Follow these instructions at home: Lifestyle Do not use any products that contain nicotine or tobacco, such as cigarettes, e-cigarettes, and chewing tobacco. If you need help quitting, ask your health care provider. Do not use street drugs. Do not share needles. Ask your health care provider for help if you need support or information about quitting drugs. Alcohol use Do not drink alcohol if your health care provider tells you not to drink. If you drink alcohol: Limit how much you have to 0-2 drinks a day. Be aware of how much alcohol is in your drink. In the [place] one drink equals one 12 oz bottle of beer (355 mL), one 5 oz glass of wine (148 mL), or one 1 oz glass of hard liquor (44 mL). General instructions Schedule regular health, dental, and eye exams. Stay current with your vaccines. Tell your health care provider if: You often feel depressed. You have ever been abused or do not feel safe at home. Summary Adopting a healthy lifestyle and getting preventive care are important in promoting health and wellness. Follow your health care provider's instructions about healthy diet, exercising, and getting tested or screened for diseases. Follow your health care provider's instructions on monitoring your cholesterol and blood pressure. This information is not intended to replace advice given to you by your health care provider. Make sure you discuss any questions you have with your health care provider. Document Revised: 10/31/2020 Document Reviewed: 08/16/2018 Elsevier Patient Education  2022 [employer]  

## 2021-06-05 NOTE — Progress Notes (Signed)
Subjective:   [name] is a 65 y.o. male who presents for Medicare Annual/Subsequent preventive examination.  I connected with  [name] on 06/05/21 by a audio enabled telemedicine application and verified that I am speaking with the correct person using two identifiers.   I discussed the limitations of evaluation and management by telemedicine. The patient expressed understanding and agreed to proceed.   Location of Patient: Home Location of  Provider: Office Person Participating in visit: Wallice Granville and Rennis Harding, NR   Review of Systems    Defer to PCP Cardiac Risk Factors include: advanced age (>38men, >24 women);male gender     Objective:    There were no vitals filed for this visit. There is no height or weight on file to calculate BMI.  Advanced Directives 06/05/2021 12/20/2017 04/25/2017 04/18/2017 02/24/2016 01/20/2016 12/27/2015  Does Patient Have a Medical Advance Directive? No No No No No No No  Would patient like information on creating a medical advance directive? No - Patient declined No - Patient declined - - No - patient declined information No - patient declined information No - patient declined information    Current Medications (verified) Outpatient Encounter Medications as of 06/05/2021  Medication Sig   amLODipine (NORVASC) 10 MG tablet Take 10 mg by mouth daily. Per [facility]   aspirin EC 81 MG tablet Take 1 tablet (81 mg total) by mouth daily.   atorvastatin (LIPITOR) 80 MG tablet Take 1 tablet (80 mg total) by mouth daily at 6 PM.   chlorthalidone (HYGROTON) 25 MG tablet Take 1 tablet (25 mg total) by mouth daily.   cholecalciferol (VITAMIN D) 1000 units tablet Take 3 tablets (3,000 Units total) by mouth daily.   diltiazem (TIAZAC) 180 MG 24 hr capsule Take 2 capsules (360 mg total) by mouth daily.   ezetimibe (ZETIA) 10 MG tablet Take 1 tablet (10 mg total) by mouth daily.   famotidine (PEPCID) 20 MG tablet Take 20 mg by mouth in the  morning and at bedtime.   levothyroxine (SYNTHROID, LEVOTHROID) 75 MCG tablet Take 1 tablet (75 mcg total) by mouth daily before breakfast.   magnesium oxide (MAG-OX) 400 MG tablet Take 1.5 tablets (600 mg total) by mouth 2 (two) times daily.   metFORMIN (GLUCOPHAGE) 1000 MG tablet TAKE 1 TABLET BY MOUTH TWICE DAILY WITH MEALS   mycophenolate (CELLCEPT) 500 MG tablet Take 1 tablet (500 mg total) by mouth 2 (two) times daily.   tacrolimus (PROGRAF) 0.5 MG capsule Take 2 capsules (1 mg total) by mouth 2 (two) times daily. (Patient taking differently: Take by mouth 2 (two) times daily. 2 tablets in the AM 1 tablet in the PM)   valsartan (DIOVAN) 160 MG tablet Take 320 mg by mouth daily.    ondansetron (ZOFRAN ODT) 4 MG disintegrating tablet Take 1 tablet (4 mg total) by mouth every 8 (eight) hours as needed for nausea or vomiting. (Patient not taking: Reported on 06/05/2021)   No facility-administered encounter medications on file as of 06/05/2021.    Allergies (verified) Patient has no known allergies.   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   MYOCARDIAL INFARCTION, 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   OBESITY 02/26/2009   Thyroid disease    THYROID DISEASE, HX OF 05/14/2008   VENTRICULAR TACHYCARDIA 02/26/2009   Past Surgical History:  Procedure Laterality Date   ANGIOPLASTY     with stent   CARDIAC ASSIST DEVICE REMOVAL     CARDIAC  CATHETERIZATION     COLONOSCOPY     CORONARY ARTERY BYPASS GRAFT     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   LEFT VENTRICULAR ASSIST DEVICE  05/2009   [facility]   Family History  Problem Relation Age of Onset   Heart disease Mother    Lung cancer Maternal Grandmother    Lung cancer Maternal Grandfather    Colon cancer Neg Hx    Esophageal cancer Neg Hx    Prostate cancer Neg Hx    Rectal cancer Neg Hx    Stomach cancer Neg Hx    Social History   Socioeconomic History   Marital status: Divorced    Spouse name: Not on file   Number of children: 1   Years of education: Not on file   Highest education level: Not on file  Occupational History   Not on file  Tobacco Use   Smoking status: Former    Packs/day: 1.00    Years: 20.00    Pack years: 20.00    Types: Cigarettes    Quit date: 07/02/1999    Years since quitting: 21.9   Smokeless tobacco: Never  Vaping Use   Vaping Use: Never used  Substance and Sexual Activity   Alcohol use: No    Comment: once yearly   Drug use: No   Sexual activity: Not on file  Other Topics Concern   Not on file  Social History Narrative   Single. Divorced. 1 son. 1 granddaughter 2014.       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       Hobbies: time on CPU            Social Determinants of Health   Financial Resource Strain: Low Risk    Difficulty of Paying Living Expenses: Not hard at all  Food Insecurity: No Food Insecurity   Worried About Charity fundraiser in the Last Year: Never true   Arboriculturist in the Last Year: Never true  Transportation Needs: No Transportation Needs   Lack of Transportation (Medical): No   Lack of Transportation (Non-Medical): No  Physical Activity: Inactive   Days of Exercise per Week: 0 days   Minutes of Exercise per Session: 0 min  Stress: No Stress Concern Present   Feeling of Stress : Not at all  Social Connections: Socially Isolated   Frequency of Communication with Friends and  Family: Twice a week   Frequency of Social Gatherings with Friends and Family: Never   Attends Religious Services: More than 4 times per year   Active Member of [employer] or Organizations: No   Attends Archivist Meetings: Never   Marital Status: Divorced    Tobacco Counseling Counseling given: Not Answered   Clinical Intake:  Pre-visit preparation completed: Yes  Pain : No/denies pain     Nutritional Risks: None Diabetes: Yes CBG done?: No Did pt. bring in CBG monitor  from home?: No  How often do you need to have someone help you when you read instructions, pamphlets, or other written materials from your doctor or pharmacy?: 1 - Never What is the last grade level you completed in school?: 12 years  Diabetic?Yes  Interpreter Needed?: No  Information entered by :: Rennis Harding, RN   Activities of Daily Living In your present state of health, do you have any difficulty performing the following activities: 06/05/2021 06/16/2020  Hearing? N N  Vision? N N  Difficulty concentrating or making decisions? N N  Walking or climbing stairs? N N  Dressing or bathing? N N  Doing errands, shopping? N N  Preparing Food and eating ? N -  Using the Toilet? N -  In the past six months, have you accidently leaked urine? N -  Do you have problems with loss of bowel control? N -  Managing your Medications? N -  Managing your Finances? N -  Housekeeping or managing your Housekeeping? N -  Some recent data might be hidden    Patient Care Team: Marin Olp, MD as PCP - General (Family Medicine) Venida Jarvis, MD as Consulting Physician (Cardiology) Edythe Clarity, [facility] (Pharmacist)  Indicate any recent Medical Services you may have received from other than [facility] providers in the past year (date may be approximate).     Assessment:   This is a routine wellness examination for Adal.  Hearing/Vision screen No results found.  Dietary issues and  exercise activities discussed: Current Exercise Habits: The patient does not participate in regular exercise at present, Exercise limited by: None identified   Goals Addressed   None   Depression Screen PHQ 2/9 Scores 06/05/2021 06/05/2021 12/17/2020 04/14/2020 02/23/2019 12/20/2017 03/17/2016  PHQ - 2 Score 0 0 0 0 0 0 0  PHQ- 9 Score - - 1 1 2  0 -    Fall Risk Fall Risk  06/05/2021 12/17/2020 12/20/2017 03/17/2016 03/03/2016  Falls in the past year? 1 1 No No No  Number falls in past yr: 0 1 - - -  Injury with Fall? 0 0 - - -  Risk for fall due to : No Fall Risks - - - -    FALL RISK PREVENTION PERTAINING TO THE HOME:  Any stairs in or around the home? Yes  If so, are there any without handrails? Yes  Home free of loose throw rugs in walkways, pet beds, electrical cords, etc? Yes  Adequate lighting in your home to reduce risk of falls? Yes   ASSISTIVE DEVICES UTILIZED TO PREVENT FALLS:  Life alert? Yes  Use of a cane, walker or w/c? No  Grab bars in the bathroom? No  Shower chair or bench in shower? No  Elevated toilet seat or a handicapped toilet? No   TIMED UP AND GO:  Was the test performed? No .  Length of time to ambulate 10 feet: N/A sec.     Cognitive Function:     6CIT Screen 06/05/2021  What Year? 0 points  What month? 0 points  What time? 0 points  Count back from 20 0 points  Months in reverse 0 points  Repeat phrase 0 points  Total Score 0    Immunizations Immunization History  Administered Date(s) Administered   Hepatitis A, Adult 06/09/2012   Hepatitis B 09/14/2011   Hepatitis B, adult 05/08/2009, 09/14/2011, 06/09/2012   HiB (PRP-OMP) 05/02/2009   Influenza Split 07/02/2011   Influenza Whole 06/25/2008  Influenza, Seasonal, Injecte, Preservative Fre 08/06/2010, 06/09/2012   Influenza,inj,Quad PF,6+ Mos 08/07/2013, 06/19/2014, 08/29/2015, 08/20/2016, 06/07/2018, 05/22/2019, 06/16/2020   Influenza-Unspecified 06/19/2014, 06/16/2020   PFIZER(Purple  Top)SARS-COV-2 Vaccination 11/04/2019, 12/01/2019, 10/22/2020   PPD Test 05/04/2009, 09/14/2011, 06/09/2012   Pneumococcal Conjugate-13 07/18/2012   Pneumococcal Polysaccharide-23 09/06/2009, 06/09/2012, 02/23/2019   Pneumococcal-Unspecified 05/02/2009   Td 05/02/2009   Tdap 09/14/2011    TDAP status: Up to date  Flu Vaccine status: Up to date  Pneumococcal vaccine status: Up to date  Covid-19 vaccine status: Completed vaccines  Qualifies for Shingles Vaccine? Yes   Zostavax completed No   Shingrix Completed?: No.    Education has been provided regarding the importance of this vaccine. Patient has been advised to call insurance company to determine out of pocket expense if they have not yet received this vaccine. Advised may also receive vaccine at local pharmacy or Health Dept. Verbalized acceptance and understanding.  Screening Tests Health Maintenance  Topic Date Due   Zoster Vaccines- Shingrix (1 of 2) Never done   COVID-19 Vaccine (4 - Booster for Pfizer series) 01/14/2021   HEMOGLOBIN A1C  04/21/2021   HIV Screening  01[birth year]64 (Originally 12/16/1970)   FOOT EXAM  06/16/2021   OPHTHALMOLOGY EXAM  06/20/2021   TETANUS/TDAP  09/13/2021   COLONOSCOPY (Pts 45-59yrs Insurance coverage will need to be confirmed)  04/26/2027   INFLUENZA VACCINE  Completed   Hepatitis C Screening  Addressed   HPV VACCINES  Aged Out    Health Maintenance  Health Maintenance Due  Topic Date Due   Zoster Vaccines- Shingrix (1 of 2) Never done   COVID-19 Vaccine (4 - Booster for Pfizer series) 01/14/2021   HEMOGLOBIN A1C  04/21/2021    Colorectal cancer screening: Type of screening: Colonoscopy. Completed 2018. Repeat every 10 years  Lung Cancer Screening: (Low Dose CT Chest recommended if Age 58-80 years, 30 pack-year currently smoking OR have quit w/in 15years.) does not qualify.   Lung Cancer Screening Referral: N/A  Additional Screening:  Hepatitis C Screening: does qualify;  Completed 2016   Vision Screening: Recommended annual ophthalmology exams for early detection of glaucoma and other disorders of the eye. Is the patient up to date with their annual eye exam?  Yes  Who is the provider or what is the name of the office in which the patient attends annual eye exams? Dr Truman Hayward If pt is not established with a provider, would they like to be referred to a provider to establish care? No .   Dental Screening: Recommended annual dental exams for proper oral hygiene  Community Resource Referral / Chronic Care Management: CRR required this visit?  No   CCM required this visit?  No      Plan:     I have personally reviewed and noted the following in the patient's chart:   Medical and social history Use of alcohol, tobacco or illicit drugs  Current medications and supplements including opioid prescriptions. Patient is not currently taking opioid prescriptions. Functional ability and status Nutritional status Physical activity Advanced directives List of other physicians Hospitalizations, surgeries, and ER visits in previous 12 months Vitals Screenings to include cognitive, depression, and falls Referrals and appointments  In addition, I have reviewed and discussed with patient certain preventive protocols, quality metrics, and best practice recommendations. A written personalized care plan for preventive services as well as general preventive health recommendations were provided to patient.     Rennis Harding, RN   06/05/2021   Nurse Notes:  Non-Face to Face 40 minute visit.   Mr. Spivack , Thank you for taking time to come for your Medicare Wellness Visit. I appreciate your ongoing commitment to your health goals. Please review the following plan we discussed and let me know if I can assist you in the future.   These are the goals we discussed:  Goals      Maintain current health status     Monitor and Manage My Blood Sugar-Diabetes Type 2      Timeframe:  Long-Range Goal Priority:  High Start Date:  04/28/21                           Expected End Date:  10/29/21                     Follow Up Date 08/05/21    - check blood sugar at prescribed times - check blood sugar before and after exercise - check blood sugar if I feel it is too high or too low - take the blood sugar log to all doctor visits    Why is this important?   Checking your blood sugar at home helps to keep it from getting very high or very low.  Writing the results in a diary or log helps the doctor know how to care for you.  Your blood sugar log should have the time, date and the results.  Also, write down the amount of insulin or other medicine that you take.  Other information, like what you ate, exercise done and how you were feeling, will also be helpful.     Notes:      PharmD Care Plan     CARE PLAN ENTRY (see longitudinal plan of care for additional care plan information)  Current Barriers:  Chronic Disease Management support, education, and care coordination needs related to Hypertension, Hyperlipidemia, and Diabetes   Hypertension BP Readings from Last 3 Encounters:  06/16/20 130/70  04/14/20 (!) 141/69  02/23/19 130/76  Pharmacist Clinical Goal(s): Over the next 365 days, patient will work with PharmD and providers to maintain BP goal <130/80 Current regimen:  Amlodipine 10 mg once daily  Chlorthalidone 25 mg once daily Diltiazem 180 mg once daily  Valsartan 160 mg tablet - 2 tablets (320 mg) once daily  Interventions: Reviewed home monitoring recommendations Reviewed diet and exercise - Maintain a healthy weight and exercise regularly, as directed by your health care provider. Eat healthy foods, such as: Lean proteins, complex carbohydrates, fresh fruits and vegetables, low-fat dairy products, healthy fats. Patient self care activities - Over the next 365 days, patient will: Check BP at least once every 1-2 weeks, document, and provide  at future appointments Ensure daily salt intake < 2300 mg/day  Hyperlipidemia Lab Results  Component Value Date/Time   LDLCALC 88 01/21/2020 12:00 AM   LDLDIRECT 69 02/12/2014 12:00 AM  Pharmacist Clinical Goal(s): Over the next 365 days, patient will work with PharmD and providers to maintain LDL goal < 70 Current regimen:  Zetia 10 mg once daily Atorvastatin 80 mg once daily  Interventions: Reviewed side effects/tolerability - none noted at this time Patient self care activities - Over the next 365 days, patient will: Continue current management  Diabetes Lab Results  Component Value Date/Time   HGBA1C 5.8 06/30/2020 12:00 AM   HGBA1C 6.1 01/21/2020 12:00 AM  Pharmacist Clinical Goal(s): Over the next 365 days, patient will work with PharmD  and providers to maintain A1c goal <7% Current regimen:  Metformin 1000 mg twice daily  Interventions: Reviewed plate method for DM diet. Patient self care activities - Over the next 365 days, patient will: Check blood sugar once daily, document, and provide at future appointments  Medication management Pharmacist Clinical Goal(s): Over the next 365 days, patient will work with PharmD and providers to maintain optimal medication adherence Current pharmacy: [facility] Interventions Comprehensive medication review performed. Continue current medication management strategy. Patient self care activities - Over the next 365 days, patient will: Take medications as prescribed Report any questions or concerns to PharmD and/or provider(s) Initial goal documentation.        This is a list of the screening recommended for you and due dates:  Health Maintenance  Topic Date Due   Zoster (Shingles) Vaccine (1 of 2) Never done   COVID-19 Vaccine (4 - Booster for Pfizer series) 01/14/2021   Hemoglobin A1C  04/21/2021   HIV Screening  01/08/2064*   Complete foot exam   06/16/2021   Eye exam for diabetics  06/20/2021   Tetanus  Vaccine  09/13/2021   Colon Cancer Screening  04/26/2027   Flu Shot  Completed   Hepatitis C Screening: USPSTF Recommendation to screen - Ages 18-79 yo.  Addressed   HPV Vaccine  Aged Out  *Topic was postponed. The date shown is not the original due date.

## 2021-06-08 ENCOUNTER — Other Ambulatory Visit (HOSPITAL_COMMUNITY): Payer: Self-pay | Admitting: Cardiovascular Disease

## 2021-06-08 DIAGNOSIS — K469 Unspecified abdominal hernia without obstruction or gangrene: Principal | ICD-10-CM

## 2021-06-08 NOTE — Unmapped (Signed)
Called pt to get CT done in [place].

## 2021-06-08 NOTE — Unmapped (Signed)
called pt to let him know about appt time and place for CT and prep for test.

## 2021-06-17 ENCOUNTER — Ambulatory Visit (HOSPITAL_COMMUNITY): Admission: RE | Admit: 2021-06-17 | Payer: Medicare HMO | Source: Ambulatory Visit

## 2021-06-18 ENCOUNTER — Encounter: Payer: Self-pay | Admitting: Family Medicine

## 2021-06-18 ENCOUNTER — Other Ambulatory Visit: Payer: Self-pay

## 2021-06-18 ENCOUNTER — Ambulatory Visit (INDEPENDENT_AMBULATORY_CARE_PROVIDER_SITE_OTHER): Payer: Medicare HMO | Admitting: Family Medicine

## 2021-06-18 VITALS — BP 122/60 | HR 91 | Temp 97.6°F | Ht 68.0 in | Wt 236.4 lb

## 2021-06-18 DIAGNOSIS — M25562 Pain in left knee: Secondary | ICD-10-CM

## 2021-06-18 DIAGNOSIS — G8929 Other chronic pain: Secondary | ICD-10-CM | POA: Diagnosis not present

## 2021-06-18 DIAGNOSIS — I509 Heart failure, unspecified: Secondary | ICD-10-CM | POA: Diagnosis not present

## 2021-06-18 NOTE — Patient Instructions (Addendum)
Health Maintenance Due  Topic Date Due   Zoster Vaccines- Shingrix (1 of 2) Please check with your pharmacy to see if they have the shingrix vaccine. If they do- please get this immunization and update [place] by phone call or mychart with dates you receive the vaccine  Never done   COVID-19 Vaccine (4 - Booster for [employer] series) Patient is aware that he needs this and he will get this scheduled at his local pharmacy.   - make sure it is the bivalent booster 01/14/2021   Thanks for doing labs with [facility]!   We will call you within two weeks about your referral to sports medicine. If you do not hear within 2 weeks, give [place] a call.   Recommended follow up: Return in about 6 months (around 12/17/2021) for physical or sooner if needed.

## 2021-06-18 NOTE — Progress Notes (Signed)
Phone [phone] In person visit   Subjective:   [name] is a 65 y.o. year old very pleasant male patient who presents for/with See problem oriented charting Chief Complaint  Patient presents with   Hyperlipidemia   Hypertension   Hypothyroidism   Diabetes    This visit occurred during the SARS-CoV-2 public health emergency.  Safety protocols were in place, including screening questions prior to the visit, additional usage of staff PPE, and extensive cleaning of exam room while observing appropriate contact time as indicated for disinfecting solutions.   Past Medical History-  Patient Active Problem List   Diagnosis Date Noted   Immunosuppressed status ([place]) 02/23/2019    Priority: 1.   Heart transplanted ([place]) 06/19/2014    Priority: 1.   Chronic systolic heart failure ([place]) 03/02/2009    Priority: 1.   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    Priority: 1.   AUTOMATIC IMPLANTABLE CARDIAC DEFIBRILLATOR SITU 02/26/2009    Priority: 1.   Diabetes mellitus type II, controlled ([place]) 12/13/2008    Priority: 1.   Coronary atherosclerosis 04/25/2007    Priority: 1.   Chronic abdominal pain 12/27/2015    Priority: 2.   BPH (benign prostatic hypertrophy) 02/26/2014    Priority: 2.   Hypothyroidism 05/14/2008    Priority: 2.   Hypertension associated with diabetes ([place]) 05/14/2008    Priority: 2.   ANEMIA, OTHER UNSPEC 04/03/2008    Priority: 2.   Hyperlipidemia associated with type 2 diabetes mellitus ([place]) 04/25/2007    Priority: 2.   GERD (gastroesophageal reflux disease) 06/19/2014    Priority: 3.   Osteoarthritis 06/19/2014    Priority: 3.   History of adenomatous polyp of colon 09/17/2013    Priority: 3.    Medications- reviewed and updated Current Outpatient Medications  Medication Sig Dispense Refill   amLODipine (NORVASC) 10 MG tablet Take 10 mg by mouth daily. Per [facility]     aspirin EC 81 MG tablet Take 1 tablet (81 mg total) by mouth daily. 30 tablet 0    atorvastatin (LIPITOR) 80 MG tablet Take 1 tablet (80 mg total) by mouth daily at 6 PM. 30 tablet 0   chlorthalidone (HYGROTON) 25 MG tablet Take 1 tablet (25 mg total) by mouth daily. 30 tablet 0   cholecalciferol (VITAMIN D) 1000 units tablet Take 3 tablets (3,000 Units total) by mouth daily. 90 tablet 0   diltiazem (TIAZAC) 180 MG 24 hr capsule Take 2 capsules (360 mg total) by mouth daily. 60 capsule 0   ezetimibe (ZETIA) 10 MG tablet Take 1 tablet (10 mg total) by mouth daily. 90 tablet 3   famotidine (PEPCID) 20 MG tablet Take 20 mg by mouth in the morning and at bedtime.     levothyroxine (SYNTHROID, LEVOTHROID) 75 MCG tablet Take 1 tablet (75 mcg total) by mouth daily before breakfast. 30 tablet 0   magnesium oxide (MAG-OX) 400 MG tablet Take 1.5 tablets (600 mg total) by mouth 2 (two) times daily. 45 tablet 0   metFORMIN (GLUCOPHAGE) 1000 MG tablet TAKE 1 TABLET BY MOUTH TWICE DAILY WITH MEALS 152 tablet 0   mycophenolate (CELLCEPT) 500 MG tablet Take 1 tablet (500 mg total) by mouth 2 (two) times daily. 60 tablet 0   tacrolimus (PROGRAF) 0.5 MG capsule Take 2 capsules (1 mg total) by mouth 2 (two) times daily. (Patient taking differently: Take by mouth 2 (two) times daily. 2 tablets in the AM 1 tablet in the PM) 120 capsule 0  valsartan (DIOVAN) 160 MG tablet Take 320 mg by mouth daily.      No current facility-administered medications for this visit.     Objective:  BP 122/60   Pulse 91   Temp 97.6 F (36.4 C) (Temporal)   Ht 5\' 8"  (1.727 m)   Wt 236 lb 6.4 oz (107.2 kg)   SpO2 98%   BMI 35.94 kg/m  Gen: NAD, resting comfortably CV: RRR no murmurs rubs or gallops Lungs: CTAB no crackles, wheeze, rhonchi Abdomen: soft/nontender/nondistended/normal bowel sounds. No rebound or guarding.  Umbilical hernia noted.  At left lateral edge of scar across abdomen somewhat firm under skin-patient reports upcoming [facility] but done at [place]: trace  edema Skin: warm, dry MSK: Patient with growth of bone over tibial tubercle on the left-not present on the right knee.  Reports mild pain with palpation of this area.  Knee otherwise appears largely normal. No recent fall or injury   Diabetic Foot Exam - Simple   Simple Foot Form Diabetic Foot exam was performed with the following findings: Yes 06/18/2021 11:29 AM  Visual Inspection No deformities, no ulcerations, no other skin breakdown bilaterally: Yes Sensation Testing Intact to touch and monofilament testing bilaterally: Yes Pulse Check Posterior Tibialis and Dorsalis pulse intact bilaterally: Yes Comments Onychomycosis on several toenails noted         Assessment and Plan   #Heart transplant and immunosuppressed-patient to follow-up closely with [facility].  Remains on mycophenolate and tacrolimus  #Left knee pain- knot at traditionally what I would think of as osgood schlatter's related - tibial tubercle . Mild pain at times at tibial tubercle where this is growing. Used to only be if leaned on it but now can hurt with activity. Offered sports med referral - he opts in!   #CAD #hyperlipidemia S: Medication: Atorvastatin 80 mg, aspirin 81 mg, Zetia 10 mg  Denies chest pain or shortness of breath  Lab Results  Component Value Date   CHOL 124 10/22/2020   HDL 33 (A) 10/22/2020   LDLCALC 61 10/22/2020   LDLDIRECT 69 02/12/2014   TRIG 149 10/22/2020   CHOLHDL 5.2 CALC 03/05/2008   A/P: Patient had labs in August with [facility] LDL was below 70 once again.  Lipids well controlled.  CAD remains asymptomatic  # Diabetes S: Medication: Metformin 1000 mg twice daily CBGs- mostly 130s in AM Exercise and diet- a little harder with knee pain- but getting osm ein still.  Lab Results  Component Value Date   HGBA1C 5.9 10/22/2020   HGBA1C 5.8 06/30/2020   HGBA1C 6.1 01/21/2020   A/P: Patient just had A1c in August with [facility] and was well controlled at 6.0-team will abstract this lab  #CHF   #hypertension S: medication: Valsartan 320 mg, chlorthalidone 25 mg, amlodipine 10 mg, diltiazem 360 mg standard release. - Has not recently required Lasix Home readings #s: home readings continue to run higher than in office- his cuff has run higher when we have compared in the past.   No increased edema or significant weight gain-appears euvolemic BP Readings from Last 3 Encounters:  06/18/21 122/60  12/17/20 134/68  06/16/20 130/70  A/P: Hypertension well-controlled-continue current medication  For CHF-appears euvolemic-continue current medication    #hypothyroidism S: compliant On thyroid medication-levothyroxine 75 mcg Lab Results  Component Value Date   TSH 2.09 10/22/2020   A/P:TSH was normal when checked in August-continue current medication.  Team will abstract labs   #BPH-tamsulosin  remains helpful. Does get some misdirection of streat at times.   #GERD-controlled with Pepcid for most part  Recommended follow up: Return in about 6 months (around 12/17/2021) for physical or sooner if needed. Future Appointments  Date Time Provider [place]  06/24/2021  1:30 PM [facility] 2 [facility] [place]  11/10/2021  1:30 PM [facility] CCM PHARMACIST [facility]    Lab/Order associations: No diagnosis found.  No orders of the defined types were placed in this encounter.   Return precautions advised.  Garret Reddish, MD

## 2021-06-19 NOTE — Unmapped (Signed)
[facility] Refill Coordination Note    Specialty Medication(s) to be Shipped:   Transplant: mycophenolate mofetil 500mg  and tacrolimus 0.5mg     Other medication(s) to be shipped: diltiazem,levothyroxine,atorvastatin, chlorthalidone     [name], DOB: 1955-10-14  Phone: There are no phone numbers on file.      All above HIPAA information was verified with patient.     Was a Nurse, learning disability used for this call? No    Completed refill call assessment today to schedule patient's medication shipment from the [facility] [phone]).  All relevant notes have been reviewed.     Specialty medication(s) and dose(s) confirmed: Regimen is correct and unchanged.   Changes to medications: Jachob reports no changes at this time.  Changes to insurance: No  New side effects reported not previously addressed with a pharmacist or physician: None reported  Questions for the pharmacist: No    Confirmed patient received a Conservation officer, historic buildings and a Surveyor, mining with first shipment. The patient will receive a drug information handout for each medication shipped and additional FDA Medication Guides as required.       DISEASE/MEDICATION-SPECIFIC INFORMATION        N/A    SPECIALTY MEDICATION ADHERENCE     Medication Adherence    Patient reported X missed doses in the last month: 0  Specialty Medication: Mycophenolate 500mg   Patient is on additional specialty medications: Yes  Additional Specialty Medications: Tacrolimus 0.5mg   Patient Reported Additional Medication X Missed Doses in the Last Month: 0  Patient is on more than two specialty medications: No              Were doses missed due to medication being on hold? No    Mycophenolate 500 mg: 10 days of medicine on hand   Tacrolimus 0.5 mg: 10 days of medicine on hand       REFERRAL TO PHARMACIST     Referral to the pharmacist: Not needed      [facility]     Shipping address confirmed in Epic.     Delivery Scheduled: Yes, Expected medication delivery date: 06/26/21.     Medication will be delivered via UPS to the prescription address in Epic WAM.    [name]   [facility] Specialty Pharmacist

## 2021-06-22 ENCOUNTER — Other Ambulatory Visit: Payer: Self-pay

## 2021-06-22 ENCOUNTER — Ambulatory Visit: Payer: Self-pay

## 2021-06-22 ENCOUNTER — Ambulatory Visit (INDEPENDENT_AMBULATORY_CARE_PROVIDER_SITE_OTHER): Payer: Medicare HMO

## 2021-06-22 ENCOUNTER — Ambulatory Visit: Payer: Medicare HMO | Admitting: Family Medicine

## 2021-06-22 VITALS — BP 152/72 | HR 100 | Ht 68.0 in | Wt 235.8 lb

## 2021-06-22 DIAGNOSIS — M25562 Pain in left knee: Secondary | ICD-10-CM

## 2021-06-22 NOTE — Progress Notes (Signed)
I, [name], LAT, ATC acting as a scribe for Kevin Leader, MD.  Subjective:    CC: L knee pain  HPI: Pt is a 65 y/o male c/o L knee pain ongoing for years. Pt notes he has a "knot" over what appears to be the tibial tuberosity of his L knee. Pt locates pain to the anterior aspect.  L knee swelling: no Mechanical symptoms: no Aggravates: kneeling, stairs Treatments tried: not kneeling  Additionally he notes a little bit of right knee pain.  This pain is more diffuse and mild.  Pertinent review of Systems: No fevers or chills  Relevant historical information: Heart transplant history. Diabetes.  Hypertension.  Hyperlipidemia.   Objective:    Vitals:   06/22/21 1048  BP: (!) 152/72  Pulse: 100  SpO2: 99%   General: Well Developed, well nourished, and in no acute distress.   MSK: Left knee nodule anterior knee at patellar tendon insertion onto tibia Normal knee motion. Stable ligamentous exam. Intact strength. Not much pain with resisted knee extension. Mildly tender to palpation at nodule anterior knee patellar tendon insertion onto tibia.  Right knee normal-appearing normal motion normal strength stable ligamentous exam.  Nontender.  Lab and Radiology Results  Diagnostic Limited MSK Ultrasound of: Bilateral knees Left knee: Normal-appearing quad tendon.   Patellar tendon normal until insertion.  Insertion area of increased vascularity just before hyperechoic change consistent with calcific tendinopathy seen on x-ray.  This is consistent with calcific patellar tendinitis. Normal medial and lateral joint lines. No Baker's cyst.  Right knee normal-appearing ultrasound examination of the knee.  No Baker's cyst. Impression: Left knee: Insertional calcific patellar tendinitis Right knee: Normal-appearing  X-ray images left knee obtained today personally and independently interpreted. Calcific change distal patellar tendon insertion on the tibia.  Possible old  fracture of osteophyte or sequelae of Osgood-Schlatter.  Minimal knee DJD otherwise Await formal radiology review  Impression and Recommendations:    Assessment and Plan: 65 y.o. male with  Left anterior knee pain due to insertional calcific patellar tendinitis.  Plan for physical therapy, and Voltaren gel.  Recheck in 4 to 6 weeks.  Discussed prescription NSAIDs.  Not safe with his heart transplant and antirejection medication.  Right knee pain also physical therapy.[name]  PDMP not reviewed this encounter. Orders Placed This Encounter  Procedures   [place] LIMITED JOINT SPACE STRUCTURES LOW LEFT(NO LINKED CHARGES)    Standing Status:   Future    Number of Occurrences:   1    Standing Expiration Date:   12/21/2021    Order Specific Question:   Reason for Exam (SYMPTOM  OR DIAGNOSIS REQUIRED)    Answer:   left knee pain    Order Specific Question:   Preferred imaging location?    Answer:   [place]   DG Knee AP/LAT W/[place] Left    Standing Status:   Future    Number of Occurrences:   1    Standing Expiration Date:   06/22/2022    Order Specific Question:   Reason for Exam (SYMPTOM  OR DIAGNOSIS REQUIRED)    Answer:   left knee pain    Order Specific Question:   Preferred imaging location?    Answer:   [facility]   Ambulatory referral to Physical Therapy    Referral Priority:   Routine    Referral Type:   Physical Medicine    Referral Reason:   Specialty Services Required    Requested  Specialty:   Physical Therapy    Number of Visits Requested:   1   No orders of the defined types were placed in this encounter.   Discussed warning signs or symptoms. Please see discharge instructions. Patient expresses understanding.   The above documentation has been reviewed and is accurate and complete [name], M.D.

## 2021-06-22 NOTE — Patient Instructions (Signed)
Thank you for coming in today.   I've referred you to Physical Therapy.  Let us know if you don't hear from them in one week.   Please use Voltaren gel (Generic Diclofenac Gel) up to 4x daily for pain as needed.  This is available over-the-counter as both the name brand Voltaren gel and the generic diclofenac gel.   Recheck in 6 weeks.   If not better we can do more including up to injection.

## 2021-06-22 NOTE — Unmapped (Signed)
Kendra @ [facility] called regarding CT abdomen with contrast that was ordered. She is requesting pre-certification for this test. She can be reached at [phone].    Thank you

## 2021-06-23 NOTE — Progress Notes (Signed)
Left knee x-ray shows a little bit of arthritis underneath the kneecap.

## 2021-06-24 ENCOUNTER — Ambulatory Visit (HOSPITAL_COMMUNITY): Admission: RE | Admit: 2021-06-24 | Payer: Medicare HMO | Source: Ambulatory Visit

## 2021-06-25 MED FILL — TACROLIMUS 0.5 MG CAPSULE, IMMEDIATE-RELEASE: ORAL | 30 days supply | Qty: 90 | Fill #3

## 2021-06-25 MED FILL — MYCOPHENOLATE MOFETIL 500 MG TABLET: ORAL | 30 days supply | Qty: 60 | Fill #10

## 2021-06-25 MED FILL — ATORVASTATIN 80 MG TABLET: ORAL | 90 days supply | Qty: 90 | Fill #2

## 2021-06-25 MED FILL — LEVOTHYROXINE 75 MCG TABLET: ORAL | 30 days supply | Qty: 30 | Fill #11

## 2021-06-25 MED FILL — DILTIAZEM CD 180 MG CAPSULE,EXTENDED RELEASE 24 HR: ORAL | 30 days supply | Qty: 60 | Fill #11

## 2021-06-25 MED FILL — CHLORTHALIDONE 25 MG TABLET: ORAL | 30 days supply | Qty: 30 | Fill #11

## 2021-06-29 ENCOUNTER — Telehealth: Payer: Self-pay | Admitting: Pharmacist

## 2021-06-29 NOTE — Chronic Care Management (AMB) (Signed)
Chronic Care Management Pharmacy Assistant   Name: [name]  MRN: [id number] DOB: 1956/06/01   Reason for Encounter: Diabetes Adherence Call   Recent office visits:  06/18/2021 OV (PCP) Marin Olp, MD; no medication changes indicated.  Recent consult visits:  06/22/2021 OV (sports medicine) Gregor Hams, MD; Left anterior knee pain due to insertional calcific patellar tendinitis.  Plan for physical therapy, and Voltaren gel.  Recheck in 4 to 6 weeks.   Discussed prescription NSAIDs.  Not safe with his heart transplant and antirejection medication.  06/04/2021 OV (cardiology) Cassandria Santee, MD; no medication changes indicated.   Hospital visits:  None in previous 6 months  Medications: Outpatient Encounter Medications as of 06/29/2021  Medication Sig   amLODipine (NORVASC) 10 MG tablet Take 10 mg by mouth daily. Per [facility]   aspirin EC 81 MG tablet Take 1 tablet (81 mg total) by mouth daily.   atorvastatin (LIPITOR) 80 MG tablet Take 1 tablet (80 mg total) by mouth daily at 6 PM.   chlorthalidone (HYGROTON) 25 MG tablet Take 1 tablet (25 mg total) by mouth daily.   cholecalciferol (VITAMIN D) 1000 units tablet Take 3 tablets (3,000 Units total) by mouth daily.   diltiazem (TIAZAC) 180 MG 24 hr capsule Take 2 capsules (360 mg total) by mouth daily.   ezetimibe (ZETIA) 10 MG tablet Take 1 tablet (10 mg total) by mouth daily.   famotidine (PEPCID) 20 MG tablet Take 20 mg by mouth in the morning and at bedtime.   levothyroxine (SYNTHROID, LEVOTHROID) 75 MCG tablet Take 1 tablet (75 mcg total) by mouth daily before breakfast.   magnesium oxide (MAG-OX) 400 MG tablet Take 1.5 tablets (600 mg total) by mouth 2 (two) times daily.   metFORMIN (GLUCOPHAGE) 1000 MG tablet TAKE 1 TABLET BY MOUTH TWICE DAILY WITH MEALS   mycophenolate (CELLCEPT) 500 MG tablet Take 1 tablet (500 mg total) by mouth 2 (two) times daily.   tacrolimus (PROGRAF) 0.5 MG capsule Take 2  capsules (1 mg total) by mouth 2 (two) times daily. (Patient taking differently: Take by mouth 2 (two) times daily. 2 tablets in the AM 1 tablet in the PM)   valsartan (DIOVAN) 160 MG tablet Take 320 mg by mouth daily.    No facility-administered encounter medications on file as of 06/29/2021.   Recent Relevant Labs: Lab Results  Component Value Date/Time   HGBA1C 6.0 04/24/2021 12:00 AM   HGBA1C 5.9 10/22/2020 12:00 AM    Kidney Function Lab Results  Component Value Date/Time   CREATININE 1.6 (A) 04/24/2021 12:00 AM   CREATININE 1.35 12/17/2020 11:33 AM   CREATININE 1.5 (A) 10/22/2020 12:00 AM   CREATININE 1.15 01/23/2016 03:27 PM   GFR 55.68 (L) 12/17/2020 11:33 AM   GFRNONAA 48 10/22/2020 12:00 AM   GFRAA 56 10/22/2020 12:00 AM    Current antihyperglycemic regimen:  Metformin 1000 mg twice daily with food  What recent interventions/DTPs have been made to improve glycemic control:  No recent interventions or DTPs.  Have there been any recent hospitalizations or ED visits since last visit with CPP? No  Patient denies hypoglycemic symptoms.  Patient denies hyperglycemic symptoms.  How often are you checking your blood sugar? once daily  What are your blood sugars ranging?  Fasting: 131, 130, 126, 119, 117  During the week, how often does your blood glucose drop below 70? Never  Are you checking your feet daily/regularly? Yes, patient states he does check  his feet regularly.  Adherence Review: Is the patient currently on a STATIN medication? Yes Is the patient currently on ACE/ARB medication? Yes Does the patient have >5 day gap between last estimated fill dates? No   Care Gaps: Medicare Annual Wellness: Completed Ophthalmology Exam: Due since 06/20/2021 Foot Exam: Next due on 06/18/2021 Hemoglobin A1C: 6.0% on 04/24/2021 Colonoscopy: Next due on 04/26/2027   Future Appointments  Date Time Provider [place]  07/07/2021 11:00 AM Carney Living, PT  [facility]  08/03/2021 10:30 AM Gregor Hams, MD [facility] None  11/10/2021  1:30 PM [facility] CCM PHARMACIST [facility]  12/29/2021 10:40 AM Marin Olp, MD [facility]    Star Rating Drugs: Atorvastatin last filled 03/30/2021 90 DS Valsartan last filled 05/18/2021 90 DS Metformin last filled 05/12/2021 76 DS  April D Calhoun, [place] Pharmacist Assistant [phone]

## 2021-07-07 ENCOUNTER — Other Ambulatory Visit: Payer: Self-pay

## 2021-07-07 ENCOUNTER — Ambulatory Visit (HOSPITAL_BASED_OUTPATIENT_CLINIC_OR_DEPARTMENT_OTHER): Payer: Medicare HMO | Attending: Family Medicine | Admitting: Physical Therapy

## 2021-07-07 DIAGNOSIS — M25561 Pain in right knee: Secondary | ICD-10-CM | POA: Insufficient documentation

## 2021-07-07 DIAGNOSIS — R2689 Other abnormalities of gait and mobility: Secondary | ICD-10-CM | POA: Insufficient documentation

## 2021-07-07 DIAGNOSIS — M25562 Pain in left knee: Secondary | ICD-10-CM | POA: Insufficient documentation

## 2021-07-07 NOTE — Therapy (Signed)
OUTPATIENT PHYSICAL THERAPY LOWER EXTREMITY EVALUATION   Patient Name: [name] MRN: [id number] DOB:1956/01/11, 65 y.o., male Today's Date: 07/07/2021    Past Medical History:  Diagnosis Date   Abdominal pain, epigastric 03/31/2009   Acute on chronic systolic heart failure ([place]) 03/02/2009   ANEMIA, OTHER UNSPEC 04/03/2008   AUTOMATIC IMPLANTABLE CARDIAC DEFIBRILLATOR SITU 02/26/2009   on old heart   Blood transfusion without reported diagnosis    CHF (congestive heart failure) ([place])    CORONARY ARTERY DISEASE 04/25/2007   DIABETES MELLITUS, TYPE II 12/13/2008   no meds, diet contolled   GERD (gastroesophageal reflux disease)    HYPERLIPIDEMIA 04/25/2007   Hypertension    HYPERTENSION, HX OF 05/14/2008   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   MYOCARDIAL INFARCTION, 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   OBESITY 02/26/2009   Thyroid disease    THYROID DISEASE, HX OF 05/14/2008   VENTRICULAR TACHYCARDIA 02/26/2009   Past Surgical History:  Procedure Laterality Date   ANGIOPLASTY     with stent   CARDIAC ASSIST DEVICE REMOVAL     CARDIAC CATHETERIZATION     COLONOSCOPY     CORONARY ARTERY BYPASS GRAFT     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   LEFT VENTRICULAR ASSIST DEVICE  05/2009   unc   Patient Active Problem List   Diagnosis Date Noted   Immunosuppressed status ([place]) 02/23/2019   Chronic abdominal pain 12/27/2015   Heart transplanted ([place]) 06/19/2014   GERD (gastroesophageal reflux disease) 06/19/2014   Osteoarthritis 06/19/2014   BPH (benign prostatic hypertrophy) 02/26/2014   History of adenomatous polyp of colon 02/58/5277   Chronic systolic heart failure ([place]) 03/02/2009   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   AUTOMATIC IMPLANTABLE CARDIAC DEFIBRILLATOR SITU 02/26/2009   Diabetes mellitus type II, controlled ([place]) 12/13/2008   Hypothyroidism 05/14/2008   Hypertension associated with diabetes ([place]) 05/14/2008   ANEMIA, OTHER UNSPEC 04/03/2008   Hyperlipidemia associated with  type 2 diabetes mellitus ([place]) 04/25/2007   Coronary atherosclerosis 04/25/2007    PCP: Marin Olp, MD  REFERRING PROVIDER: Gregor Hams, MD  REFERRING DIAG: Bilateral knee pain   THERAPY DIAG:  Bilateral knee pain   ONSET DATE: 2-3 weeks ago with acute onset of knee pain   SUBJECTIVE:   SUBJECTIVE STATEMENT: Patient has had increased pain in his left knee of the past 2-3 weeks. He has also noticed some pain in the right knee. Sometimes the right just gives out going up and down the steps. He has pain when he is walking. He noticed a " knot" on his left knee several years ago but it did not give him any problem.   PERTINENT HISTORY: CHF, Defibrillator, DMII. Tachycardia    PAIN:  Are you having pain? Yes VAS scale: 3/10 Pain location: anterior left knee  Pain orientation: Left  PAIN TYPE: aching Pain description: intermittent  Aggravating factors: going up and down steps; walking  Relieving factors: rest   PRECAUTIONS: None  WEIGHT BEARING RESTRICTIONS No  FALLS:  Has patient fallen in last 6 months? No  LIVING ENVIRONMENT: Spli t level house 7 steps    OCCUPATION:  Retired  [place]: Nothing effected by the knees   PLOF: Independent  PATIENT GOALS:  To have less pain in his knees    OBJECTIVE:   DIAGNOSTIC FINDINGS:   Left Knee: IMPRESSION: 1. No acute findings in the knee. 2. Mild patellofemoral joint space narrowing. Otherwise, no significant degenerative change about the knee.    PATIENT SURVEYS:  FOTO  53% limitation 71% expected 10 visits   COGNITION:  Overall cognitive status: Within functional limits for tasks assessed     SENSATION:  Light touch: Appears intact  Stereognosis: Appears intact  Hot/Cold: Appears intact  Proprioception: Appears intact   POSTURE:  Normal posture  LE AROM/PROM: Full active and passive ROM of the knees and the hip. Mild pain comming back from full flexion on the left.    LE MMT:  MMT  Right 07/07/2021 Left 07/07/2021  Hip flexion 18.4 14.9  Hip extension    Hip abduction 30.7 33.8  Hip adduction    Hip internal rotation    Hip external rotation    Knee flexion 19.4 23.8  Knee extension 20.9 20.6  Ankle dorsiflexion    Ankle plantarflexion    Ankle inversion    Ankle eversion     (Blank rows = not tested)  LOWER EXTREMITY SPECIAL TESTS:  Mild pain with left knee verus stress; valgus /verus (-) right;     GAIT: Mild lateral lean to the right with ambulation   TODAY'S TREATMENT: Program Notes Put pressure arcoss the tendon when it is sore    Exercises Supine Quad Set - 1 x daily - 7 x weekly - 3 sets - 10 reps - 3-5 sec hold Small Range Straight Leg Raise - 1 x daily - 7 x weekly - 3 sets - 10 reps Hooklying Clamshell with Resistance - 1 x daily - 7 x weekly - 3 sets - 10 reps    PATIENT EDUCATION:  Education details: reviewed HEP, symptom management; anatomy of the condition  Person educated: Patient Education method: Explanation, Demonstration, Tactile cues, Verbal cues, and Handouts Education comprehension: verbalized understanding, returned demonstration, verbal cues required, tactile cues required, and needs further education   HOME EXERCISE PROGRAM: Access Code: [email or web address] URL: [email or web address] Date: 07/07/2021 Prepared by: Carolyne Littles  Program Notes Put pressure arcoss the tendon when it is sore    Exercises Supine Quad Set - 1 x daily - 7 x weekly - 3 sets - 10 reps - 3-5 sec hold Small Range Straight Leg Raise - 1 x daily - 7 x weekly - 3 sets - 10 reps Hooklying Clamshell with Resistance - 1 x daily - 7 x weekly - 3 sets - 10 reps   ASSESSMENT:  CLINICAL IMPRESSION: Patient is a 65 year old male with bilateral knee pain L > R. He has increased pain when he is standing and walking. He hasfull range although he did have some pain on the left coming back from full extension. He had mild tenderness to palpation in  the right patellar tendon. He has mild strength deficits in hip and knee strength. He would benefit from skilled therapy to improve his ability to perform ambulation without anterior knee pain.  decreased activity tolerance, decreased mobility, difficulty walking, decreased strength, and pain  1 comorbidity: Heart transplant   community activity and yard work  [employer] POTENTIAL: Good  CLINICAL DECISION MAKING: Stable/uncomplicated  EVALUATION COMPLEXITY: Low   GOALS: Goals reviewed with patient? No  SHORT TERM GOALS:  STG Name Target Date Goal status  1 Patient will demonstrate increase knee extension and hip flexion peak strength by 7lbs   Baseline:  07/28/2021 INITIAL  2 Patient will reports a 50% reduction in patellar pain with ambulation  Baseline:  07/28/2021 INITIAL  3 Patient will be independent with basic HEP for quad strengthening  Baseline: 07/28/2021 INITIAL   LONG TERM GOALS:   LTG  Name Target Date Goal status  1 Patient will be independent with a complete home and gym strengthening program  Baseline: 08/18/2021 INITIAL  2 Patient will go up/down 10 steps without increased pain  Baseline: 08/18/2021 INITIAL  3 Patient will ambulate for 1 mile without increased knee pain Baseline: 08/18/2021 INITIAL    PLAN: PT FREQUENCY: 1x/week  PT DURATION: 6 weeks  PLANNED INTERVENTIONS: Therapeutic exercises, Therapeutic activity, Neuro Muscular re-education, Balance training, Gait training, Patient/Family education, Joint mobilization, Stair training, Electrical stimulation, Cryotherapy, Moist heat, Taping, Ultrasound, and Manual therapy  PLAN FOR NEXT SESSION: consider TFM to patella tendon; review HEP; add standing closed chain exercises to HEP.    Carney Living PT DPT  07/07/2021, 11:07 AM

## 2021-07-08 ENCOUNTER — Encounter (HOSPITAL_BASED_OUTPATIENT_CLINIC_OR_DEPARTMENT_OTHER): Payer: Self-pay | Admitting: Physical Therapy

## 2021-07-11 ENCOUNTER — Other Ambulatory Visit: Payer: Self-pay | Admitting: Family Medicine

## 2021-07-16 ENCOUNTER — Other Ambulatory Visit: Payer: Self-pay

## 2021-07-16 ENCOUNTER — Ambulatory Visit (HOSPITAL_BASED_OUTPATIENT_CLINIC_OR_DEPARTMENT_OTHER): Payer: Medicare HMO | Admitting: Physical Therapy

## 2021-07-16 DIAGNOSIS — R2689 Other abnormalities of gait and mobility: Secondary | ICD-10-CM | POA: Diagnosis not present

## 2021-07-16 DIAGNOSIS — M25562 Pain in left knee: Secondary | ICD-10-CM | POA: Diagnosis not present

## 2021-07-16 DIAGNOSIS — M25561 Pain in right knee: Secondary | ICD-10-CM

## 2021-07-16 NOTE — Therapy (Addendum)
OUTPATIENT PHYSICAL THERAPY TREATMENT NOTE/Discharge    Patient Name: [name] MRN: [id number] DOB:08-15-1956, 65 y.o., male Today's Date: 07/17/2021  PCP: Marin Olp, MD REFERRING PROVIDER: Marin Olp, MD   PT End of Session - 07/17/21 1412     Visit Number 2    Number of Visits 6    Date for PT Re-Evaluation 08/18/21    PT Start Time 6269    PT Stop Time 1228    PT Time Calculation (min) 43 min    Activity Tolerance Patient tolerated treatment well    Behavior During Therapy [facility] for tasks assessed/performed             Past Medical History:  Diagnosis Date   Abdominal pain, epigastric 03/31/2009   Acute on chronic systolic heart failure ([place]) 03/02/2009   ANEMIA, OTHER UNSPEC 04/03/2008   AUTOMATIC IMPLANTABLE CARDIAC DEFIBRILLATOR SITU 02/26/2009   on old heart   Blood transfusion without reported diagnosis    CHF (congestive heart failure) ([place])    CORONARY ARTERY DISEASE 04/25/2007   DIABETES MELLITUS, TYPE II 12/13/2008   no meds, diet contolled   GERD (gastroesophageal reflux disease)    HYPERLIPIDEMIA 04/25/2007   Hypertension    HYPERTENSION, HX OF 05/14/2008   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   MYOCARDIAL INFARCTION, 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   OBESITY 02/26/2009   Thyroid disease    THYROID DISEASE, HX OF 05/14/2008   VENTRICULAR TACHYCARDIA 02/26/2009   Past Surgical History:  Procedure Laterality Date   ANGIOPLASTY     with stent   CARDIAC ASSIST DEVICE REMOVAL     CARDIAC CATHETERIZATION     COLONOSCOPY     CORONARY ARTERY BYPASS GRAFT     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   LEFT VENTRICULAR ASSIST DEVICE  05/2009   unc   Patient Active Problem List   Diagnosis Date Noted   Immunosuppressed status ([place]) 02/23/2019   Chronic abdominal pain 12/27/2015   Heart transplanted ([place]) 06/19/2014   GERD (gastroesophageal reflux disease) 06/19/2014   Osteoarthritis 06/19/2014   BPH (benign prostatic hypertrophy) 02/26/2014   History  of adenomatous polyp of colon 48/54/6270   Chronic systolic heart failure ([place]) 03/02/2009   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   AUTOMATIC IMPLANTABLE CARDIAC DEFIBRILLATOR SITU 02/26/2009   Diabetes mellitus type II, controlled ([place]) 12/13/2008   Hypothyroidism 05/14/2008   Hypertension associated with diabetes ([place]) 05/14/2008   ANEMIA, OTHER UNSPEC 04/03/2008   Hyperlipidemia associated with type 2 diabetes mellitus ([place]) 04/25/2007   Coronary atherosclerosis 04/25/2007   PCP: Marin Olp, MD   REFERRING PROVIDER: Gregor Hams, MD   REFERRING DIAG: Bilateral knee pain    THERAPY DIAG:  Bilateral knee pain    ONSET DATE: 2-3 weeks ago with acute onset of knee pain    SUBJECTIVE:    SUBJECTIVE STATEMENT: Patient reports his right knee continues to be sore. He feels it more when he is walking.    PERTINENT HISTORY: CHF, Defibrillator, DMII. Tachycardia     PAIN:  Are you having pain? Yes VAS scale: 3/10 Pain location: anterior left knee  Pain orientation: Left  PAIN TYPE: aching Pain description: intermittent  Aggravating factors: going up and down steps; walking  Relieving factors: rest    PRECAUTIONS: None   WEIGHT BEARING RESTRICTIONS No  HOME EXERCISE PROGRAM: Access Code: [email or web address] URL: [email or web address] Date: 07/07/2021 Prepared by: Carolyne Littles   Program Notes Put pressure arcoss the tendon when it is sore  Exercises Supine Quad Set - 1 x daily - 7 x weekly - 3 sets - 10 reps - 3-5 sec hold Small Range Straight Leg Raise - 1 x daily - 7 x weekly - 3 sets - 10 reps Hooklying Clamshell with Resistance - 1 x daily - 7 x weekly - 3 sets - 10 reps    Today's treatment   11/1  Quad set x15 bilateral  SLR 2x10 bilateral. More trouble on the left  Hip abduction blue 3x20 Standing heel raise 2x20  Standing march 2x15   Thomas stretch with cuing for technique 2x20 each side  Hamstring stretch 2x20 sec hold each  side  Manul TFM to bilateral PF ligament area. Could fel it more on the left   CLINICAL IMPRESSION: Therapy expanded the patients exercise program. He was encouraged to be consistent and persistent with his exercises and stretches. He had some difficulty with SLR. He felt hip abduction was too easy so his band was advanced from green to blue. He also felt heel raises were too easy so his reps were advanced.    decreased activity tolerance, decreased mobility, difficulty walking, decreased strength, and pain   1 comorbidity: Heart transplant    community activity and yard work   [employer] POTENTIAL: Good   CLINICAL DECISION MAKING: Stable/uncomplicated   EVALUATION COMPLEXITY: Low     GOALS: Goals reviewed with patient? No   SHORT TERM GOALS:   STG Name Target Date Goal status  1 Patient will demonstrate increase knee extension and hip flexion peak strength by 7lbs   Baseline:  07/28/2021 INITIAL  2 Patient will reports a 50% reduction in patellar pain with ambulation  Baseline:  07/28/2021 INITIAL  3 Patient will be independent with basic HEP for quad strengthening  Baseline: 07/28/2021 INITIAL    LONG TERM GOALS:    LTG Name Target Date Goal status  1 Patient will be independent with a complete home and gym strengthening program  Baseline: 08/18/2021 INITIAL  2 Patient will go up/down 10 steps without increased pain  Baseline: 08/18/2021 INITIAL  3 Patient will ambulate for 1 mile without increased knee pain Baseline: 08/18/2021 INITIAL      PLAN: PT FREQUENCY: 1x/week   PT DURATION: 6 weeks   PLANNED INTERVENTIONS: Therapeutic exercises, Therapeutic activity, Neuro Muscular re-education, Balance training, Gait training, Patient/Family education, Joint mobilization, Stair training, Electrical stimulation, Cryotherapy, Moist heat, Taping, Ultrasound, and Manual therapy   PLAN FOR NEXT SESSION: consider TFM to patella tendon; review HEP; add standing closed chain  exercises to HEP.    PHYSICAL THERAPY DISCHARGE SUMMARY  Visits from Start of Care: 2 Current functional level related to goals / functional outcomes: Patient did not return for follow up    Remaining deficits: Unknown    Education / Equipment: Unknown    Patient agrees to discharge. Patient goals were not met. Patient is being discharged due to not returning since the last visit.   Carney Living PT DPT  07/17/2021, 2:13 PM

## 2021-07-23 ENCOUNTER — Ambulatory Visit (HOSPITAL_BASED_OUTPATIENT_CLINIC_OR_DEPARTMENT_OTHER): Payer: Medicare HMO | Admitting: Physical Therapy

## 2021-07-27 MED ORDER — CHLORTHALIDONE 25 MG TABLET
ORAL_TABLET | Freq: Every day | ORAL | 11 refills | 30 days | Status: CP
Start: 2021-07-27 — End: 2022-07-27
  Filled 2021-07-28: qty 30, 30d supply, fill #0

## 2021-07-27 MED ORDER — DILTIAZEM CD 180 MG CAPSULE,EXTENDED RELEASE 24 HR
ORAL_CAPSULE | Freq: Every day | ORAL | 11 refills | 30 days | Status: CP
Start: 2021-07-27 — End: ?
  Filled 2021-07-28: qty 60, 30d supply, fill #0

## 2021-07-27 MED ORDER — LEVOTHYROXINE 75 MCG TABLET
ORAL_TABLET | Freq: Every day | ORAL | 11 refills | 30 days | Status: CP
Start: 2021-07-27 — End: 2022-07-27
  Filled 2021-07-28: qty 30, 30d supply, fill #0

## 2021-07-27 NOTE — Unmapped (Signed)
[facility] Refill Coordination Note    Specialty Medication(s) to be Shipped:   Transplant: mycophenolate mofetil 500mg  and tacrolimus 0.5mg     Other medication(s) to be shipped: chlorthalidone, diltiazem, levothyroxine     Andrew Dickerson, DOB: [birth year]  Phone: There are no phone numbers on file.      All above HIPAA information was verified with patient.     Was a Nurse, learning disability used for this call? No    Completed refill call assessment today to schedule patient's medication shipment from the [facility] [phone]).  All relevant notes have been reviewed.     Specialty medication(s) and dose(s) confirmed: Regimen is correct and unchanged.   Changes to medications: M[place]ennon reports no changes at this time.  Changes to insurance: No  New side effects reported not previously addressed with a pharmacist or physician: None reported  Questions for the pharmacist: No    Confirmed patient received a Conservation officer, historic buildings and a Surveyor, mining with first shipment. The patient will receive a drug information handout for each medication shipped and additional FDA Medication Guides as required.       DISEASE/MEDICATION-SPECIFIC INFORMATION        N/A    SPECIALTY MEDICATION ADHERENCE     Medication Adherence    Patient reported X missed doses in the last month: 0  Specialty Medication: Mycophenolate 500mg   Patient is on additional specialty medications: Yes  Additional Specialty Medications: Tacrolimus 0.5mg   Patient Reported Additional Medication X Missed Doses in the Last Month: 0  Patient is on more than two specialty medications: No              Were doses missed due to medication being on hold? No    Mycophenolate 500 mg: 7 days of medicine on hand   Tacrolimus 0.5 mg: 7 days of medicine on hand       REFERRAL TO PHARMACIST     Referral to the pharmacist: Not needed      [facility]     Shipping address confirmed in Epic.     Delivery Scheduled: Yes, Expected medication delivery date: 07/29/21.     Medication will be delivered via UPS to the prescription address in Epic WAM.    [name]   [facility] Specialty Pharmacist

## 2021-07-28 ENCOUNTER — Other Ambulatory Visit: Payer: Self-pay | Admitting: Family Medicine

## 2021-07-28 ENCOUNTER — Encounter (HOSPITAL_BASED_OUTPATIENT_CLINIC_OR_DEPARTMENT_OTHER): Payer: Medicare HMO | Admitting: Physical Therapy

## 2021-07-28 DIAGNOSIS — R7309 Other abnormal glucose: Secondary | ICD-10-CM

## 2021-07-28 MED FILL — MYCOPHENOLATE MOFETIL 500 MG TABLET: ORAL | 30 days supply | Qty: 60 | Fill #11

## 2021-07-28 MED FILL — TACROLIMUS 0.5 MG CAPSULE, IMMEDIATE-RELEASE: ORAL | 30 days supply | Qty: 90 | Fill #4

## 2021-08-03 ENCOUNTER — Ambulatory Visit: Payer: Medicare HMO | Admitting: Family Medicine

## 2021-08-04 NOTE — Unmapped (Signed)
called pt and he is aware of labs needed to be done.

## 2021-08-06 ENCOUNTER — Encounter (HOSPITAL_BASED_OUTPATIENT_CLINIC_OR_DEPARTMENT_OTHER): Payer: Medicare HMO | Admitting: Physical Therapy

## 2021-08-10 DIAGNOSIS — D709 Neutropenia, unspecified: Secondary | ICD-10-CM | POA: Diagnosis not present

## 2021-08-10 DIAGNOSIS — Z48298 Encounter for aftercare following other organ transplant: Secondary | ICD-10-CM | POA: Diagnosis not present

## 2021-08-10 DIAGNOSIS — B259 Cytomegaloviral disease, unspecified: Secondary | ICD-10-CM | POA: Diagnosis not present

## 2021-08-10 DIAGNOSIS — Z125 Encounter for screening for malignant neoplasm of prostate: Secondary | ICD-10-CM | POA: Diagnosis not present

## 2021-08-10 DIAGNOSIS — E559 Vitamin D deficiency, unspecified: Secondary | ICD-10-CM | POA: Diagnosis not present

## 2021-08-10 DIAGNOSIS — Z941 Heart transplant status: Secondary | ICD-10-CM | POA: Diagnosis not present

## 2021-08-10 DIAGNOSIS — E119 Type 2 diabetes mellitus without complications: Secondary | ICD-10-CM | POA: Diagnosis not present

## 2021-08-10 DIAGNOSIS — R7989 Other specified abnormal findings of blood chemistry: Secondary | ICD-10-CM | POA: Diagnosis not present

## 2021-08-10 DIAGNOSIS — E785 Hyperlipidemia, unspecified: Secondary | ICD-10-CM | POA: Diagnosis not present

## 2021-08-13 ENCOUNTER — Encounter (HOSPITAL_BASED_OUTPATIENT_CLINIC_OR_DEPARTMENT_OTHER): Payer: Medicare HMO | Admitting: Physical Therapy

## 2021-08-14 LAB — CBC W/ DIFFERENTIAL
BASOPHILS ABSOLUTE COUNT: 84 {cells}/uL (ref 0–200)
BASOPHILS RELATIVE PERCENT: 0.9 %
EOSINOPHILS ABSOLUTE COUNT: 167 {cells}/uL (ref 15–500)
EOSINOPHILS RELATIVE PERCENT: 1.8 %
HEMATOCRIT: 39.6 % (ref 38.5–50.0)
HEMOGLOBIN: 13.1 g/dL — ABNORMAL LOW (ref 13.2–17.1)
LYMPHOCYTES ABSOLUTE COUNT: 1479 {cells}/uL (ref 850–3900)
LYMPHOCYTES RELATIVE PERCENT: 15.9 %
MEAN CORPUSCULAR HEMOGLOBIN CONC: 33.1 g/dL (ref 32.0–36.0)
MEAN CORPUSCULAR HEMOGLOBIN: 30 pg (ref 27.0–33.0)
MEAN CORPUSCULAR VOLUME: 90.6 fL (ref 80.0–100.0)
MEAN PLATELET VOLUME: 10 fL (ref 7.5–12.5)
MONOCYTES ABSOLUTE COUNT: 521 {cells}/uL (ref 200–950)
MONOCYTES RELATIVE PERCENT: 5.6 %
NEUTROPHILS ABSOLUTE COUNT: 7049 {cells}/uL (ref 1500–7800)
NEUTROPHILS RELATIVE PERCENT: 75.8 %
PLATELET COUNT: 288 10*3/uL (ref 140–400)
RED BLOOD CELL COUNT: 4.37 10*6/uL (ref 4.20–5.80)
RED CELL DISTRIBUTION WIDTH: 12.5 % (ref 11.0–15.0)
WHITE BLOOD CELL COUNT: 9.3 10*3/uL (ref 3.8–10.8)

## 2021-08-14 LAB — BASIC METABOLIC PANEL
BLOOD UREA NITROGEN: 28 mg/dL — ABNORMAL HIGH (ref 7–25)
BUN / CREAT RATIO: 19 (calc) (ref 6–22)
CALCIUM: 10 mg/dL (ref 8.6–10.3)
CHLORIDE: 101 mmol/L (ref 98–110)
CO2: 21 mmol/L (ref 20–32)
CREATININE: 1.49 mg/dL — ABNORMAL HIGH (ref 0.70–1.35)
EGFR CKD-EPI NON-AA MALE: 52 mL/min/{1.73_m2} — ABNORMAL LOW
GLUCOSE RANDOM: 158 mg/dL — ABNORMAL HIGH (ref 65–99)
MAGNESIUM: 1.7 mg/dL (ref 1.5–2.5)
POTASSIUM: 5 mmol/L (ref 3.5–5.3)
SODIUM: 135 mmol/L (ref 135–146)

## 2021-08-14 LAB — TACROLIMUS LEVEL, TROUGH: TACROLIMUS, TROUGH: 5.3 ug/L

## 2021-08-14 NOTE — Unmapped (Addendum)
Discussed recent labs with Edgar Frisk, PharmD.  Plan is to Make No Changes with repeat labs in 3 Months.  Sent patient MyChart message with lab results and follow up plan    Lab Results   Component Value Date    TACROLIMUS 5.3 08/10/2021     Goal: Tac: 5-8  Current Dose: Tacrolimus 1 mg/0.5 mg    Lab Results   Component Value Date    BUN 28 (H) 08/10/2021    CREATININE 1.49 (H) 08/10/2021    K 5.0 08/10/2021    GLU 158 (H) 08/10/2021    MG 1.7 08/10/2021     Lab Results   Component Value Date    WBC 9.3 08/10/2021    HGB 13.1 (L) 08/10/2021    HCT 39.6 08/10/2021    PLT 288 08/10/2021    NEUTROABS 7,049 08/10/2021    EOSABS 167 08/10/2021

## 2021-08-19 DIAGNOSIS — Z941 Heart transplant status: Principal | ICD-10-CM

## 2021-08-19 MED ORDER — FAMOTIDINE 20 MG TABLET
ORAL_TABLET | Freq: Two times a day (BID) | ORAL | 3 refills | 90 days | Status: CP
Start: 2021-08-19 — End: 2022-08-19
  Filled 2021-08-25: qty 180, 90d supply, fill #0

## 2021-08-19 MED ORDER — AMLODIPINE 10 MG TABLET
ORAL_TABLET | Freq: Every day | ORAL | 3 refills | 90 days | Status: CP
Start: 2021-08-19 — End: 2022-08-19
  Filled 2021-08-25: qty 90, 90d supply, fill #0

## 2021-08-19 MED ORDER — MYCOPHENOLATE MOFETIL 500 MG TABLET
ORAL_TABLET | Freq: Two times a day (BID) | ORAL | 3 refills | 90 days
Start: 2021-08-19 — End: 2022-08-19

## 2021-08-19 MED ORDER — ASPIRIN 81 MG TABLET,DELAYED RELEASE
ORAL_TABLET | Freq: Every day | ORAL | 3 refills | 90.00000 days | Status: CP
Start: 2021-08-19 — End: ?
  Filled 2021-08-25: qty 90, 90d supply, fill #0

## 2021-08-19 NOTE — Unmapped (Signed)
[facility] Refill Coordination Note    Specialty Medication(s) to be Shipped:   Transplant: mycophenolate mofetil 500mg  and tacrolimus 0.5mg     Other medication(s) to be shipped: famotidine, valsartan, aspirin 81mg , amlodipine, chlorthalidone, diltiazem and levothyroxine     Andrew Dickerson, DOB: 12/16/1955  Phone: There are no phone numbers on file.      All above HIPAA information was verified with patient.     Was a Nurse, learning disability used for this call? No    Completed refill call assessment today to schedule patient's medication shipment from the [facility] [phone]).  All relevant notes have been reviewed.     Specialty medication(s) and dose(s) confirmed: Regimen is correct and unchanged.   Changes to medications: Andrew Dickerson reports no changes at this time.  Changes to insurance: No  New side effects reported not previously addressed with a pharmacist or physician: None reported  Questions for the pharmacist: No    Confirmed patient received a Conservation officer, historic buildings and a Surveyor, mining with first shipment. The patient will receive a drug information handout for each medication shipped and additional FDA Medication Guides as required.       DISEASE/MEDICATION-SPECIFIC INFORMATION        N/A    SPECIALTY MEDICATION ADHERENCE     Medication Adherence    Patient reported X missed doses in the last month: 0  Specialty Medication: Mycophenolate 500mg   Patient is on additional specialty medications: Yes  Additional Specialty Medications: Tacrolimus 0.5mg   Patient Reported Additional Medication X Missed Doses in the Last Month: 0  Patient is on more than two specialty medications: No  Any gaps in refill history greater than 2 weeks in the last 3 months: no  Demonstrates understanding of importance of adherence: yes  Informant: patient  Reliability of informant: reliable  Confirmed plan for next specialty medication refill: delivery by pharmacy  Refills needed for supportive medications: yes, ordered or provider notified        Were doses missed due to medication being on hold? No    Mycophenolate 500 mg: 9 days of medicine on hand   Tacrolimus 0.5 mg: 9 days of medicine on hand     REFERRAL TO PHARMACIST     Referral to the pharmacist: Not needed      [facility]     Shipping address confirmed in Epic.     Delivery Scheduled: Yes, Expected medication delivery date: 08/26/2021.  However, Rx request for refills was sent to the provider as there are none remaining.     Medication will be delivered via UPS to the prescription address in Epic WAM.    Andrew Dickerson   [facility] Specialty Technician

## 2021-08-20 ENCOUNTER — Encounter (HOSPITAL_BASED_OUTPATIENT_CLINIC_OR_DEPARTMENT_OTHER): Payer: Medicare HMO | Admitting: Physical Therapy

## 2021-08-20 MED ORDER — MYCOPHENOLATE MOFETIL 500 MG TABLET
ORAL_TABLET | Freq: Two times a day (BID) | ORAL | 3 refills | 90 days | Status: CP
Start: 2021-08-20 — End: 2022-08-20
  Filled 2021-08-25: qty 180, 90d supply, fill #0

## 2021-08-21 ENCOUNTER — Telehealth: Payer: Self-pay | Admitting: Pharmacist

## 2021-08-21 NOTE — Chronic Care Management (AMB) (Signed)
Chronic Care Management Pharmacy Assistant   Name: [name]  MRN: [id number] DOB: 07-24-1956   Reason for Encounter: Diabetes Adherence Call    Recent office visits:  None  Recent consult visits:  None  Hospital visits:  None in previous 6 months  Medications: Outpatient Encounter Medications as of 08/21/2021  Medication Sig   amLODipine (NORVASC) 10 MG tablet Take 10 mg by mouth daily. Per [facility]   aspirin EC 81 MG tablet Take 1 tablet (81 mg total) by mouth daily.   atorvastatin (LIPITOR) 80 MG tablet Take 1 tablet (80 mg total) by mouth daily at 6 PM.   chlorthalidone (HYGROTON) 25 MG tablet Take 1 tablet (25 mg total) by mouth daily.   cholecalciferol (VITAMIN D) 1000 units tablet Take 3 tablets (3,000 Units total) by mouth daily.   diltiazem (TIAZAC) 180 MG 24 hr capsule Take 2 capsules (360 mg total) by mouth daily.   ezetimibe (ZETIA) 10 MG tablet Take 1 tablet by mouth once daily   famotidine (PEPCID) 20 MG tablet Take 20 mg by mouth in the morning and at bedtime.   levothyroxine (SYNTHROID, LEVOTHROID) 75 MCG tablet Take 1 tablet (75 mcg total) by mouth daily before breakfast.   magnesium oxide (MAG-OX) 400 MG tablet Take 1.5 tablets (600 mg total) by mouth 2 (two) times daily.   metFORMIN (GLUCOPHAGE) 1000 MG tablet TAKE 1 TABLET BY MOUTH TWICE DAILY WITH MEALS   mycophenolate (CELLCEPT) 500 MG tablet Take 1 tablet (500 mg total) by mouth 2 (two) times daily.   tacrolimus (PROGRAF) 0.5 MG capsule Take 2 capsules (1 mg total) by mouth 2 (two) times daily. (Patient taking differently: Take by mouth 2 (two) times daily. 2 tablets in the AM 1 tablet in the PM)   valsartan (DIOVAN) 160 MG tablet Take 320 mg by mouth daily.    No facility-administered encounter medications on file as of 08/21/2021.   Recent Relevant Labs: Lab Results  Component Value Date/Time   HGBA1C 6.0 04/24/2021 12:00 AM   HGBA1C 5.9 10/22/2020 12:00 AM    Kidney Function Lab Results   Component Value Date/Time   CREATININE 1.6 (A) 04/24/2021 12:00 AM   CREATININE 1.35 12/17/2020 11:33 AM   CREATININE 1.5 (A) 10/22/2020 12:00 AM   CREATININE 1.15 01/23/2016 03:27 PM   GFR 55.68 (L) 12/17/2020 11:33 AM   GFRNONAA 48 10/22/2020 12:00 AM   GFRAA 56 10/22/2020 12:00 AM    Current antihyperglycemic regimen:  Metformin 1000 mg twice daily  What recent interventions/DTPs have been made to improve glycemic control:  No recent interventions or DTPs.  Have there been any recent hospitalizations or ED visits since last visit with CPP? No  Patient denies hypoglycemic symptoms.  Patient denies hyperglycemic symptoms.  How often are you checking your blood sugar? once daily  What are your blood sugars ranging?  Fasting: 115, 130   During the week, how often does your blood glucose drop below 70? Never  Are you checking your feet daily/regularly? Yes, patient states he checks his feet regularly.  Adherence Review: Is the patient currently on a STATIN medication? Yes Is the patient currently on ACE/ARB medication? Yes Does the patient have >5 day gap between last estimated fill dates? No   Care Gaps: Medicare Annual Wellness: Completed 06/05/2021 Ophthalmology Exam: Overdue since 06/20/2021 Foot Exam: Next due on 06/18/2022 Hemoglobin A1C: 6.0% on 04/24/2021 Colonoscopy: Next due on 04/26/2027  Future Appointments  Date Time Provider [place]  09/14/2021 11:45 AM Carney Living, PT [facility]  09/21/2021 11:45 AM Carney Living, PT [facility]  09/28/2021 11:45 AM Carney Living, PT [facility]  11/10/2021  1:30 PM [facility]CM PHARMACIST [facility]  12/29/2021 10:40 AM Marin Olp, MD [facility]   Star Rating Drugs: Atorvastatin last filled 06/25/2021 90 DS Valsartan last filled 05/18/2021 90 DS Metformin last filled 05/12/2021 76 DS  April D Calhoun, [place] Pharmacist Assistant [phone]

## 2021-08-25 MED FILL — CHLORTHALIDONE 25 MG TABLET: ORAL | 30 days supply | Qty: 30 | Fill #1

## 2021-08-25 MED FILL — DILTIAZEM CD 180 MG CAPSULE,EXTENDED RELEASE 24 HR: ORAL | 30 days supply | Qty: 60 | Fill #1

## 2021-08-25 MED FILL — TACROLIMUS 0.5 MG CAPSULE, IMMEDIATE-RELEASE: ORAL | 30 days supply | Qty: 90 | Fill #5

## 2021-08-25 MED FILL — VALSARTAN 160 MG TABLET: ORAL | 90 days supply | Qty: 180 | Fill #3

## 2021-08-25 MED FILL — LEVOTHYROXINE 75 MCG TABLET: ORAL | 30 days supply | Qty: 30 | Fill #1

## 2021-08-26 ENCOUNTER — Ambulatory Visit: Payer: Medicare HMO | Admitting: Family Medicine

## 2021-09-03 MED FILL — ACCU-CHEK AVIVA PLUS TEST STRIPS: 33 days supply | Qty: 200 | Fill #1

## 2021-09-14 ENCOUNTER — Ambulatory Visit (HOSPITAL_BASED_OUTPATIENT_CLINIC_OR_DEPARTMENT_OTHER): Payer: Medicare HMO | Admitting: Physical Therapy

## 2021-09-21 ENCOUNTER — Encounter (HOSPITAL_BASED_OUTPATIENT_CLINIC_OR_DEPARTMENT_OTHER): Payer: Self-pay | Admitting: Physical Therapy

## 2021-09-23 NOTE — Unmapped (Unsigned)
[facility] Clinical Assessment & Refill Coordination Note    Andrew Dickerson, DOB: 07-20-1956  Phone: There are no phone numbers on file.    All above HIPAA information was verified with {Blank:19197::patient.,patient's caregiver, ***.,patient's family member, ***.}     Was a Nurse, learning disability used for this call? {Blank single:19197::Yes, ***. Patient language is appropriate in WAM,No}    Specialty Medication(s):   {specpharm:59087}     Current Outpatient Medications   Medication Sig Dispense Refill   ??? amLODIPine (NORVASC) 10 MG tablet Take 1 tablet (10 mg total) by mouth daily. 90 tablet 3   ??? aspirin (ADULT LOW DOSE ASPIRIN) 81 MG tablet Take 1 tablet (81 mg total) by mouth daily. 90 tablet 3   ??? atorvastatin (LIPITOR) 80 MG tablet Take 1 tablet (80 mg total) by mouth daily. 90 tablet 3   ??? blood sugar diagnostic (ACCU-CHEK AVIVA PLUS TEST STRP) Strp CHECK BLOOD SUGAR BEFORE MEALS, AT BEDTIME, & AS NEEDED FOR HYPERGLYCEMIA UP TO 6 TIMES A DAY 200 strip 11   ??? chlorthalidone (HYGROTON) 25 MG tablet Take 1 tablet (25 mg total) by mouth daily. 30 tablet 11   ??? cholecalciferol, vitamin D3, 1,000 unit capsule Take 3,000 Units by mouth daily.     ??? dilTIAZem (CARDIZEM CD) 180 MG 24 hr capsule Take 2 capsules (360 mg total) by mouth daily. 60 capsule 11   ??? ezetimibe (ZETIA) 10 mg tablet Take 10 mg by mouth daily.     ??? famotidine (PEPCID) 20 MG tablet Take 1 tablet (20 mg total) by mouth Two (2) times a day. 180 tablet 3   ??? levothyroxine (SYNTHROID) 75 MCG tablet Take 1 tablet (75 mcg total) by mouth daily. 30 tablet 11   ??? metFORMIN (GLUCOPHAGE) 1000 MG tablet Take 1,000 mg by mouth Two (2) times a day.     ??? mycophenolate (CELLCEPT) 500 mg tablet Take 1 tablet (500 mg total) by mouth Two (2) times a day. 180 tablet 3   ??? tacrolimus (PROGRAF) 0.5 MG capsule Take 2 capsules (1 mg total) by mouth daily AND 1 capsule (0.5 mg total) nightly. 90 capsule 11   ??? tamsulosin (FLOMAX) 0.4 mg capsule TAKE 1 CAPSULE EVERY DAY 90 capsule 3   ??? valsartan (DIOVAN) 160 MG tablet Take 2 tablets (320 mg total) by mouth daily. 180 tablet 3     No current facility-administered medications for this visit.        Changes to medications: {Blank:19197::Fabrizio reports starting the following medications: ***,Nole Reports stopping the following medications: ***,Timonthy reports no changes at this time.}    No Known Allergies    Changes to allergies: {Blank:19197::Yes: ***,No}    SPECIALTY MEDICATION ADHERENCE     *** *** {Blank:19197::mg,mg/ml,***}: *** days of medicine on hand   *** *** {Blank:19197::mg,mg/ml,***}: *** days of medicine on hand   *** *** {Blank:19197::mg,mg/ml,***}: *** days of medicine on hand   *** *** {Blank:19197::mg,mg/ml,***}: *** days of medicine on hand   *** *** {Blank:19197::mg,mg/ml,***}: *** days of medicine on hand     Medication Adherence    Patient reported X missed doses in the last month: 0  Specialty Medication: tacrolimus 0.5 MG capsule (PROGRAF)  Patient is on additional specialty medications: Yes  Additional Specialty Medications: Mycophenolate 500mg   Patient Reported Additional Medication X Missed Doses in the Last Month: 0  Patient is on more than two specialty medications: No          Specialty  medication(s) dose(s) confirmed: {Blank:19197::Regimen is correct and unchanged.,Patient reports changes to the regimen as follows: ***}     Are there any concerns with adherence? {Blank:19197::Yes: ***,No}    Adherence counseling provided? {Blank:19197::Yes: ***,Not needed}    CLINICAL MANAGEMENT AND INTERVENTION      Clinical Benefit Assessment:    Do you feel the medicine is effective or helping your condition? {Blank:19197::Yes,No,Patient declined to answer}    Clinical Benefit counseling provided? {Blank:19197::Not needed,Reasonable expectations discussed: ***,Labs from *** show evidence of clinical benefit,Progress note from *** shows evidence of clinical benefit,consulted provider regarding clinical benefit concerns,***}    Adverse Effects Assessment:    Are you experiencing any side effects? {Blank:19197::Yes, patient reports experiencing ***. Side effect counseling provided: ***,No}    Are you experiencing difficulty administering your medicine? {Blank:19197::Yes, patient reports ***. Medication administration counseling provided: ***,No}    Quality of Life Assessment:    Quality of Life    Rheumatology  Oncology  Dermatology  Cystic Fibrosis          {DiseaseSpecificQOL:73897}    Have you discussed this with your provider? {Blank:19197::Not needed,Yes,No - pharmacist will consult provider}    Acute Infection Status:    Acute infections noted within Epic:  No active infections  Patient reported infection: {Blank single:19197::None,***- patient reported to provider,***- pharmacy reported to provider}    Therapy Appropriateness:    Is therapy appropriate and patient progressing towards therapeutic goals? {Blank:19197::Yes, therapy is appropriate and should be continued,Pharmacist will consult provider}    DISEASE/MEDICATION-SPECIFIC INFORMATION      {clinicspecificinstructions:59274}    PATIENT SPECIFIC NEEDS     - Does the patient have any physical, cognitive, or cultural barriers? {Blank single:19197::No,Yes - ***}    - Is the patient high risk? {sschighriskpts:78327}    - Does the patient require a Care Management Plan? {Blank single:19197::No,Yes}     SOCIAL DETERMINANTS OF HEALTH     At the [facility], we have learned that life circumstances - like trouble affording food, housing, utilities, or transportation can affect the health of many of our patients.   That is why we wanted to ask: are you currently experiencing any life circumstances that are negatively impacting your health and/or quality of life? {YES/NO/PATIENTDECLINED:93004}    Social Determinants of Health     Food Insecurity: Not on file   Tobacco Use: Medium Risk   ??? Smoking Tobacco Use: Former   ??? Smokeless Tobacco Use: Never   ??? Passive Exposure: Not on file   Transportation Needs: Not on file   Alcohol Use: Not on file   Housing/Utilities: Not on file   Substance Use: Not on file   Financial Resource Strain: Not on file   Physical Activity: Not on file   Health Literacy: Not on file   Stress: Not on file   Intimate Partner Violence: Not on file   Depression: Not on file   Social Connections: Not on file       Would you be willing to receive help with any of the needs that you have identified today? {Yes/No/Not applicable:93005}       SHIPPING     Specialty Medication(s) to be Shipped:   {specpharm:59087}    Other medication(s) to be shipped: {Blank:19197::No additional medications requested for fill at this time}     Changes to insurance: {Blank:19197::Yes: ***,No}    Delivery Scheduled: {Blank:19197::Yes, Expected medication delivery date: ***.,Yes, Expected medication delivery date: ***.  However, Rx request for refills was sent to  the provider as there are none remaining.,Patient declined refill at this time due to ***.,No, cannot schedule delivery at this time as there are outstanding items that need addressed.  This note has been handed off to the provider for follow up.,Due to patient insurance changes, unable to fill at [facility], please route Rx to *** specialty pharmacy}     Medication will be delivered via {Blank:19197::UPS,Next Day Courier,Same Day Courier,Clinic Courier - *** clinic,***} to the confirmed {Blank:19197::prescription,temporary} address in [facility].    The patient will receive a drug information handout for each medication shipped and additional FDA Medication Guides as required.  Verified that patient has previously received a Conservation officer, historic buildings and a Surveyor, mining.    The patient or caregiver noted above participated in the development of this care plan and knows that they can request review of or adjustments to the care plan at any time.      All of the patient's questions and concerns have been addressed.    [name]   [facility] Specialty Pharmacist

## 2021-09-24 MED FILL — DILTIAZEM CD 180 MG CAPSULE,EXTENDED RELEASE 24 HR: ORAL | 30 days supply | Qty: 60 | Fill #2

## 2021-09-24 MED FILL — CHLORTHALIDONE 25 MG TABLET: ORAL | 30 days supply | Qty: 30 | Fill #2

## 2021-09-24 MED FILL — ATORVASTATIN 80 MG TABLET: ORAL | 90 days supply | Qty: 90 | Fill #3

## 2021-09-24 MED FILL — LEVOTHYROXINE 75 MCG TABLET: ORAL | 30 days supply | Qty: 30 | Fill #2

## 2021-09-24 MED FILL — TACROLIMUS 0.5 MG CAPSULE, IMMEDIATE-RELEASE: ORAL | 30 days supply | Qty: 90 | Fill #6

## 2021-09-28 ENCOUNTER — Encounter (HOSPITAL_BASED_OUTPATIENT_CLINIC_OR_DEPARTMENT_OTHER): Payer: Self-pay | Admitting: Physical Therapy

## 2021-10-05 NOTE — Unmapped (Signed)
Can you touch base with [facility], I do not do prior auth's.

## 2021-10-16 NOTE — Unmapped (Signed)
[name] Clinical Assessment & Refill Coordination Note    Andrew Dickerson, DOB: 11/14/1955  Phone: There are no phone numbers on file.    All above HIPAA information was verified with patient.     Was a Nurse, learning disability used for this call? 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 amLODIPine (NORVASC) 10 MG tablet Take 1 tablet (10 mg total) by mouth daily. 90 tablet 3   ??? aspirin (ADULT LOW DOSE ASPIRIN) 81 MG tablet Take 1 tablet (81 mg total) by mouth daily. 90 tablet 3   ??? atorvastatin (LIPITOR) 80 MG tablet Take 1 tablet (80 mg total) by mouth daily. 90 tablet 3   ??? blood sugar diagnostic (ACCU-CHEK AVIVA PLUS TEST STRP) Strp CHECK BLOOD SUGAR BEFORE MEALS, AT BEDTIME, & AS NEEDED FOR HYPERGLYCEMIA UP TO 6 TIMES A DAY 200 strip 11   ??? chlorthalidone (HYGROTON) 25 MG tablet Take 1 tablet (25 mg total) by mouth daily. 30 tablet 11   ??? cholecalciferol, vitamin D3, 1,000 unit capsule Take 3,000 Units by mouth daily.     ??? dilTIAZem (CARDIZEM CD) 180 MG 24 hr capsule Take 2 capsules (360 mg total) by mouth daily. 60 capsule 11   ??? ezetimibe (ZETIA) 10 mg tablet Take 10 mg by mouth daily.     ??? famotidine (PEPCID) 20 MG tablet Take 1 tablet (20 mg total) by mouth Two (2) times a day. 180 tablet 3   ??? levothyroxine (SYNTHROID) 75 MCG tablet Take 1 tablet (75 mcg total) by mouth daily. 30 tablet 11   ??? metFORMIN (GLUCOPHAGE) 1000 MG tablet Take 1,000 mg by mouth Two (2) times a day.     ??? mycophenolate (CELLCEPT) 500 mg tablet Take 1 tablet (500 mg total) by mouth Two (2) times a day. 180 tablet 3   ??? tacrolimus (PROGRAF) 0.5 MG capsule Take 2 capsules (1 mg total) by mouth daily AND 1 capsule (0.5 mg total) nightly. 90 capsule 11   ??? tamsulosin (FLOMAX) 0.4 mg capsule TAKE 1 CAPSULE EVERY DAY 90 capsule 3   ??? valsartan (DIOVAN) 160 MG tablet Take 2 tablets (320 mg total) by mouth daily. 180 tablet 3     No current facility-administered medications for this visit.        Changes to medications: Alanzo reports no changes at this time.    No Known Allergies    Changes to allergies: No    SPECIALTY MEDICATION ADHERENCE     Tacrolimus 0.5 mg: about 10 days of medicine on hand   Mycophenolate 500 mg: about 30 days of medicine on hand       Medication Adherence    Patient reported X missed doses in the last month: 1  Specialty Medication: tacrolimus 0.5 mg  Patient is on additional specialty medications: Yes  Additional Specialty Medications: Mycophenolate 500 mg  Patient Reported Additional Medication X Missed Doses in the Last Month: 1  Patient is on more than two specialty medications: No  Informant: patient     Specialty medication(s) dose(s) confirmed: Regimen is correct and unchanged.     Are there any concerns with adherence? No    Adherence counseling provided? Not needed    CLINICAL MANAGEMENT AND INTERVENTION      Clinical Benefit Assessment:    Do you feel the medicine is effective  or helping your condition? Yes    Clinical Benefit counseling provided? Not needed    Adverse Effects Assessment:    Are you experiencing any side effects? No    Are you experiencing difficulty administering your medicine? No    Quality of Life Assessment:    Quality of Life    Rheumatology  Oncology  Dermatology  Cystic Fibrosis          How many days over the past month did your heart transplant  keep you from your normal activities? For example, brushing your teeth or getting up in the morning. 0    Have you discussed this with your provider? Not needed    Acute Infection Status:    Acute infections noted within Epic:  No active infections  Patient reported infection: None    Therapy Appropriateness:    Is therapy appropriate and patient progressing towards therapeutic goals? Yes, therapy is appropriate and should be continued    DISEASE/MEDICATION-SPECIFIC INFORMATION      N/A    PATIENT SPECIFIC NEEDS     - Does the patient have any physical, cognitive, or cultural barriers? No    - Is the patient high risk? Yes, patient is taking a REMS drug. Medication is dispensed in compliance with REMS program    - Does the patient require a Care Management Plan? No     SOCIAL DETERMINANTS OF HEALTH     At the [facility], we have learned that life circumstances - like trouble affording food, housing, utilities, or transportation can affect the health of many of our patients.   That is why we wanted to ask: are you currently experiencing any life circumstances that are negatively impacting your health and/or quality of life? No    Social Determinants of Health     Food Insecurity: Not on file   Tobacco Use: Medium Risk   ??? Smoking Tobacco Use: Former   ??? Smokeless Tobacco Use: Never   ??? Passive Exposure: Not on file   Transportation Needs: Not on file   Alcohol Use: Not on file   Housing/Utilities: Not on file   Substance Use: Not on file   Financial Resource Strain: Not on file   Physical Activity: Not on file   Health Literacy: Not on file   Stress: Not on file   Intimate Partner Violence: Not on file   Depression: Not on file   Social Connections: Not on file       Would you be willing to receive help with any of the needs that you have identified today? Not applicable       SHIPPING     Specialty Medication(s) to be Shipped:   Transplant: tacrolimus 0.5mg     Other medication(s) to be shipped: chlorthalidone 25 mg, diltiazem 180 mg, levothyroxine 75 mcg     Changes to insurance: No    Delivery Scheduled: Yes, Expected medication delivery date: 2/17.     Medication will be delivered via UPS to the confirmed prescription address in [facility].    The patient will receive a drug information handout for each medication shipped and additional FDA Medication Guides as required.  Verified that patient has previously received a Conservation officer, historic buildings and a Surveyor, mining.    The patient or caregiver noted above participated in the development of this care plan and knows that they can request review of or adjustments to the care plan at any time.      All  of the patient's questions and concerns have been addressed.    Vittoria Noreen Glade Nurse  [facility] Specialty Pharmacist

## 2021-10-22 MED FILL — LEVOTHYROXINE 75 MCG TABLET: ORAL | 30 days supply | Qty: 30 | Fill #3

## 2021-10-22 MED FILL — DILTIAZEM CD 180 MG CAPSULE,EXTENDED RELEASE 24 HR: ORAL | 30 days supply | Qty: 60 | Fill #3

## 2021-10-22 MED FILL — CHLORTHALIDONE 25 MG TABLET: ORAL | 30 days supply | Qty: 30 | Fill #3

## 2021-10-22 MED FILL — TACROLIMUS 0.5 MG CAPSULE, IMMEDIATE-RELEASE: ORAL | 30 days supply | Qty: 90 | Fill #7

## 2021-10-26 ENCOUNTER — Other Ambulatory Visit: Payer: Self-pay | Admitting: Family Medicine

## 2021-10-26 DIAGNOSIS — R7309 Other abnormal glucose: Secondary | ICD-10-CM

## 2021-11-09 NOTE — Progress Notes (Signed)
Chronic Care Management Pharmacy Note  11/10/2021 Name:  [name] MRN:  [id number] DOB:  February 13, 1956    Subjective: LEM Bush is an 66 y.o. year old male who is a primary patient of Hunter, Brayton Mars, MD.  The CCM team was consulted for assistance with disease management and care coordination needs.    Engaged with patient by telephone for follow up visit in response to provider referral for pharmacy case management and/or care coordination services.   Consent to Services:  The patient was given the following information about Chronic Care Management services today, agreed to services, and gave verbal consent: 1. CCM service includes personalized support from designated clinical staff supervised by the primary care provider, including individualized plan of care and coordination with other care providers 2. 24/7 contact phone numbers for assistance for urgent and routine care needs. 3. Service will only be billed when office clinical staff spend 20 minutes or more in a month to coordinate care. 4. Only one practitioner may furnish and bill the service in a calendar month. 5.The patient may stop CCM services at any time (effective at the end of the month) by phone call to the office staff. 6. The patient will be responsible for cost sharing (co-pay) of up to 20% of the service fee (after annual deductible is met). Patient agreed to services and consent obtained.  Patient Care Team: Marin Olp, MD as PCP - General (Family Medicine) Venida Jarvis, MD as Consulting Physician (Cardiology) Edythe Clarity, [facility] (Pharmacist)  Recent office visits:  None   Recent consult visits:  None   Hospital visits:  None in previous 6 months   Objective:  Lab Results  Component Value Date   CREATININE 1.6 (A) 04/24/2021   BUN 26 (A) 04/24/2021   GFR 55.68 (L) 12/17/2020   GFRNONAA 48 10/22/2020   GFRAA 56 10/22/2020   NA 136 (A) 04/24/2021   K 4.7 04/24/2021    CALCIUM 9.7 04/24/2021   CO2 19 04/24/2021   GLUCOSE 111 (H) 12/17/2020    Lab Results  Component Value Date/Time   HGBA1C 6.0 04/24/2021 12:00 AM   HGBA1C 5.9 10/22/2020 12:00 AM   GFR 55.68 (L) 12/17/2020 11:33 AM   GFR 69.16 01/23/2016 03:27 PM    Last diabetic Eye exam:  Lab Results  Component Value Date/Time   HMDIABEYEEXA No Retinopathy 06/20/2020 10:07 AM    Last diabetic Foot exam: No results found for: HMDIABFOOTEX   Lab Results  Component Value Date   CHOL 124 04/24/2021   HDL 33 (A) 04/24/2021   LDLCALC 68 04/24/2021   LDLDIRECT 69 02/12/2014   TRIG 147 04/24/2021   CHOLHDL 5.2 CALC 03/05/2008    Hepatic Function Latest Ref Rng & Units 04/24/2021 12/17/2020 10/22/2020  Total Protein 6.0 - 8.3 g/dL - 7.1 -  Albumin 3.5 - 5.0 4.6 4.3 4.2  AST 14 - 40 12(A) 12 20  ALT 10 - 40 _0 Alk Phosphatase 25 - 125 78 79 104  Total Bilirubin 0.2 - 1.2 mg/dL - 0.8 -  Bilirubin, Direct 0.1 - 0.5 mg/dL - - -     CBC Latest Ref Rng & Units 04/24/2021 12/17/2020 10/22/2020  WBC - 9.9 9.1 10.4  Hemoglobin 13.5 - 17.5 13.1(A) 13.2 14.2  Hematocrit 41 - 53 41 40.2 44  Platelets 150 - 399 293 263.0 323    Lab Results  Component Value Date/Time   VD25OH 46 04/24/2021 12:00  AM    Clinical ASCVD: No  The ASCVD Risk score (Arnett DK, et al., 2019) failed to calculate for the following reasons:   The valid total cholesterol range is 130 to 320 mg/dL    Depression screen [facility] 2/9 06/05/2021 06/05/2021 12/17/2020  Decreased Interest 0 0 0  Down, Depressed, Hopeless 0 0 0  PHQ - 2 Score 0 0 0  Altered sleeping - - 1  Tired, decreased energy - - 0  Change in appetite - - 0  Feeling bad or failure about yourself  - - 0  Trouble concentrating - - 0  Moving slowly or fidgety/restless - - 0  Suicidal thoughts - - 0  PHQ-9 Score - - 1  Difficult doing work/chores - - Not difficult at all     Social History   Tobacco Use  Smoking Status Former   Packs/day: 1.00   Years:  20.00   Pack years: 20.00   Types: Cigarettes   Quit date: 07/02/1999   Years since quitting: 22.3  Smokeless Tobacco Never   BP Readings from Last 3 Encounters:  06/22/21 (!) 152/72  06/18/21 122/60  12/17/20 134/68   Pulse Readings from Last 3 Encounters:  06/22/21 100  06/18/21 91  12/17/20 85   Wt Readings from Last 3 Encounters:  06/22/21 235 lb 12.8 oz (107 kg)  06/18/21 236 lb 6.4 oz (107.2 kg)  12/17/20 236 lb 3.2 oz (107.1 kg)   BMI Readings from Last 3 Encounters:  06/22/21 35.85 kg/m  06/18/21 35.94 kg/m  12/17/20 35.91 kg/m    Assessment/Interventions: Review of patient past medical history, allergies, medications, health status, including review of consultants reports, laboratory and other test data, was performed as part of comprehensive evaluation and provision of chronic care management services.   SDOH:  (Social Determinants of Health) assessments and interventions performed: Yes  Financial Resource Strain: Low Risk    Difficulty of Paying Living Expenses: Not hard at all    SDOH Screenings   Alcohol Screen: Low Risk    Last Alcohol Screening Score (AUDIT): 0  Depression (PHQ2-9): Low Risk    PHQ-2 Score: 0  Financial Resource Strain: Low Risk    Difficulty of Paying Living Expenses: Not hard at all  Food Insecurity: No Food Insecurity   Worried About Charity fundraiser in the Last Year: Never true   Ran Out of Food in the Last Year: Never true  Housing: Low Risk    Last Housing Risk Score: 0  Physical Activity: Inactive   Days of Exercise per Week: 0 days   Minutes of Exercise per Session: 0 min  Social Connections: Socially Isolated   Frequency of Communication with Friends and Family: Twice a week   Frequency of Social Gatherings with Friends and Family: Never   Attends Religious Services: More than 4 times per year   Active Member of [employer] or Organizations: No   Attends Music therapist: Never   Marital Status: Divorced   Stress: No Stress Concern Present   Feeling of Stress : Not at all  Tobacco Use: Medium Risk   Smoking Tobacco Use: Former   Smokeless Tobacco Use: Never   Passive Exposure: Not on Pensions consultant Needs: No Transportation Needs   Lack of Transportation (Medical): No   Lack of Transportation (Non-Medical): No    CCM Care Plan  No Known Allergies  Medications Reviewed Today     Reviewed by Edythe Clarity, [facility] (Pharmacist) on 11/10/21  at [place] List Status: <None>   Medication Order Taking? Sig Documenting Provider Last Dose Status Informant  amLODipine (NORVASC) 10 MG tablet [id number] Yes Take 10 mg by mouth daily. Per [facility] [provider] Taking Active   aspirin EC 81 MG tablet [id number] Yes Take 1 tablet (81 mg total) by mouth daily. Marin Olp, MD Taking Active   atorvastatin (LIPITOR) 80 MG tablet [id number] Yes Take 1 tablet (80 mg total) by mouth daily at 6 PM. Marin Olp, MD Taking Active   chlorthalidone (HYGROTON) 25 MG tablet [id number] Yes Take 1 tablet (25 mg total) by mouth daily. Marin Olp, MD Taking Active   cholecalciferol (VITAMIN D) 1000 units tablet [id number] Yes Take 3 tablets (3,000 Units total) by mouth daily. Marin Olp, MD Taking Active   diltiazem [facility] 180 MG 24 hr capsule [id number] Yes Take 2 capsules (360 mg total) by mouth daily. Marin Olp, MD Taking Active   ezetimibe (ZETIA) 10 MG tablet [id number] Yes Take 1 tablet by mouth once daily Marin Olp, MD Taking Active   famotidine (PEPCID) 20 MG tablet [id number]  Take 20 mg by mouth in the morning and at bedtime. [provider]  Expired 06/18/21 2359   levothyroxine (SYNTHROID, LEVOTHROID) 75 MCG tablet [id number] Yes Take 1 tablet (75 mcg total) by mouth daily before breakfast. Marin Olp, MD Taking Active   magnesium oxide (MAG-OX) 400 MG tablet [id number] Yes Take 1.5 tablets (600 mg total) by mouth 2 (two) times daily. Marin Olp, MD Taking Active   metFORMIN (GLUCOPHAGE) 1000 MG tablet [id number] Yes TAKE 1 TABLET BY MOUTH TWICE DAILY WITH MEALS Marin Olp, MD Taking Active   mycophenolate (CELLCEPT) 500 MG tablet [id number] Yes Take 1 tablet (500 mg total) by mouth 2 (two) times daily. Marin Olp, MD Taking Active   tacrolimus (PROGRAF) 0.5 MG capsule [id number] Yes Take 2 capsules (1 mg total) by mouth 2 (two) times daily.  Patient taking differently: Take by mouth 2 (two) times daily. 2 tablets in the AM 1 tablet in the PM   Marin Olp, MD Taking Active   valsartan (DIOVAN) 160 MG tablet [id number] Yes Take 320 mg by mouth daily.  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/2009   Diabetes mellitus type II, controlled ([place]) 12/13/2008   Hypothyroidism 05/14/2008   Hypertension associated with diabetes ([place]) 05/14/2008   ANEMIA, OTHER UNSPEC 04/03/2008   Hyperlipidemia associated with type 2 diabetes mellitus ([place]) 04/25/2007   Coronary atherosclerosis 04/25/2007    Immunization History  Administered Date(s) Administered   Hepatitis A, Adult 06/09/2012   Hepatitis B 09/14/2011   Hepatitis B, adult 05/08/2009, 09/14/2011, 06/09/2012   HiB (PRP-OMP) 05/02/2009   Influenza Split 07/02/2011   Influenza Whole 06/25/2008   Influenza, Seasonal, Injecte, Preservative Fre 08/06/2010, 06/09/2012   Influenza,inj,Quad PF,6+ Mos 08/07/2013, 06/19/2014, 08/29/2015, 08/20/2016, 06/07/2018,  05/22/2019, 06/16/2020   Influenza-Unspecified 06/19/2014, 06/16/2020   PFIZER(Purple Top)SARS-COV-2 Vaccination 11/04/2019,  12/01/2019, 10/22/2020   PPD Test 05/04/2009, 09/14/2011, 06/09/2012   Pneumococcal Conjugate-13 07/18/2012   Pneumococcal Polysaccharide-23 09/06/2009, 06/09/2012, 02/23/2019   Pneumococcal-Unspecified 05/02/2009   Td 05/02/2009   Tdap 09/14/2011   Zoster Recombinat (Shingrix) 12/16/2006    Conditions to be addressed/monitored:  HTN, DM, HLD, Hypothyroidism  Care Plan : General Pharmacy (Adult)  Updates made by Edythe Clarity, RPH since 11/10/2021 12:00 AM     Problem: HTN, DM, HLD, Hypothyroidism   Priority: High  Onset Date: 04/28/2021     Long-Range Goal: Patient-Specific Goal   Start Date: 04/28/2021  Expected End Date: 10/29/2021  Recent Progress: On track  Priority: High  Note:   Current Barriers:  FBG trending up  Pharmacist Clinical Goal(s):  Patient will maintain control of glucose and BP as evidenced by labs/monitoring  through collaboration with PharmD and provider.   Interventions: 1:1 collaboration with Marin Olp, MD regarding development and update of comprehensive plan of care as evidenced by provider attestation and co-signature Inter-disciplinary care team collaboration (see longitudinal plan of care) Comprehensive medication review performed; medication list updated in electronic medical record  Hypertension (BP goal <130/80) -Controlled -Current treatment: Amlodipine 82m daily Appropriate, Query effective, Safe, Accessible Chlorthalidone 239mdaily  Appropriate, Query effective, Safe, Accessible Diltiazem 18084mD daily  Appropriate, Query effective, Safe, Accessible Valsartan 160m77mily  Appropriate, Query effective, Safe, Accessible -Medications previously tried: none noted  -Current home readings: 143/69, 118/54, 149/67, 127/63, 132/64, 136/62 -Current dietary habits: unchanged see previous  -Denies  hypotensive/hypertensive symptoms -Educated on BP goals and benefits of medications for prevention of heart attack, stroke and kidney damage; Exercise goal of 150 minutes per week; Importance of home blood pressure monitoring; Symptoms of hypotension and importance of maintaining adequate hydration; -Counseled to monitor BP at home weekly, document, and provide log at future appointments -Recommended to continue current medication  Update 11/10/21 141/61, 132/66 recent BP readings Denies dizziness or HA. BP well controlled for now no changes needed. Continue current medications  Hyperlipidemia: (LDL goal < 70) -Controlled -Current treatment: Atorvastatin 80mg49mly Zetia 10mg 31my -Medications previously tried: none noted  -[facility]. -Educated on Cholesterol goals;  Benefits of statin for ASCVD risk reduction; Importance of limiting foods high in cholesterol; -Denies any adverse effects -Recommended to continue current medication [facility] (A[facility] goal <6.5%) -Controlled -Current medications: Metformin 1000mg B60mith meals Appropriate, Query effective, -Medications previously tried: insulin glargine  -Current home glucose readings fasting glucose: 128, 131, 150, 145, 126, 119 -Denies hypoglycemic/hyperglycemic symptoms -Educated on A1c and blood sugar goals; Prevention and management of hypoglycemic episodes; Carbohydrate counting and/or plate method -Counseled to check feet daily and get yearly eye exams -Recommended to continue current medication  Update 11/10/21 FBG - 130-150s He is eating two nutty buddy crackers per day.  States blood sugar is "creeping up."  We discussed trying to limit that to one cracker per day to see if that helps glucose. Has upcoming appt in April with PCP - recheck A1c and adjust treatment at that time. NO changes except lifestyle mods at this time.  Patient  Goals/Self-Care Activities Patient will:  - take medications as prescribed check glucose daily, document, and provide at future appointments check blood pressure weekly, document, and provide at future appointments  Follow Up Plan: The care management team will reach out to the patient again over the next 180 days.  Medication Assistance: None required.  Patient affirms current coverage meets needs.  Compliance/Adherence/Medication fill history: Care Gaps: Needs A1c checked   Patient's preferred pharmacy is:  [facility] [place] [facility] [address] [place] [address] Phone: [phone] Fax: [phone]   Uses pill box? Yes Pt endorses 100% compliance  We discussed: Benefits of medication synchronization, packaging and delivery as well as enhanced pharmacist oversight with [employer]. Patient decided to: Continue current medication management strategy  Care Plan and Follow Up Patient Decision:  Patient agrees to Care Plan and Follow-up.  Plan: The care management team will reach out to the patient again over the next 180 days.  Beverly Milch, PharmD Clinical Pharmacist [phone]

## 2021-11-10 ENCOUNTER — Ambulatory Visit (INDEPENDENT_AMBULATORY_CARE_PROVIDER_SITE_OTHER): Payer: Medicare HMO | Admitting: Pharmacist

## 2021-11-10 DIAGNOSIS — E119 Type 2 diabetes mellitus without complications: Secondary | ICD-10-CM

## 2021-11-10 DIAGNOSIS — E1159 Type 2 diabetes mellitus with other circulatory complications: Secondary | ICD-10-CM

## 2021-11-10 NOTE — Patient Instructions (Signed)
Visit Information ? ? Goals Addressed   ?None ?  ? ?Patient Care Plan: General Pharmacy (Adult)  ?  ? ?Problem Identified: HTN, DM, HLD, Hypothyroidism   ?Priority: High  ?Onset Date: 04/28/2021  ?  ? ?Long-Range Goal: Patient-Specific Goal   ?Start Date: 04/28/2021  ?Expected End Date: 10/29/2021  ?This Visit's Progress: On track  ?Priority: High  ?Note:   ?Current Barriers:  ?None at this time ? ?Pharmacist Clinical Goal(s):  ?Patient will maintain control of glucose and BP as evidenced by labs/monitoring  through collaboration with PharmD and provider.  ? ?Interventions: ?1:1 collaboration with Marin Olp, MD regarding development and update of comprehensive plan of care as evidenced by provider attestation and co-signature ?Inter-disciplinary care team collaboration (see longitudinal plan of care) ?Comprehensive medication review performed; medication list updated in electronic medical record ? ?Hypertension (BP goal <130/80) ?-Controlled ?-Current treatment: ?Amlodipine '10mg'$  daily ?Chlorthalidone '25mg'$  daily ?Diltiazem '180mg'$  CD daily ?Valsartan '160mg'$  daily ?-Medications previously tried: none noted  ?-Current home readings: 143/69, 118/54, 149/67, 127/63, 132/64, 136/62 ?-Current dietary habits: unchanged see previous ? ?-Denies hypotensive/hypertensive symptoms ?-Educated on BP goals and benefits of medications for prevention of heart attack, stroke and kidney damage; ?Exercise goal of 150 minutes per week; ?Importance of home blood pressure monitoring; ?Symptoms of hypotension and importance of maintaining adequate hydration; ?-Counseled to monitor BP at home weekly, document, and provide log at future appointments ?-Recommended to continue current medication ? ?Hyperlipidemia: (LDL goal < 70) ?-Controlled ?-Current treatment: ?Atorvastatin '80mg'$  daily ?Zetia '10mg'$  daily ?-Medications previously tried: none noted  ?-Reports he just had labs drawn at [facility], last LDL was excellent. ?-Educated on Cholesterol  goals;  ?Benefits of statin for ASCVD risk reduction; ?Importance of limiting foods high in cholesterol; ?-Denies any adverse effects ?-Recommended to continue current medication ?Follow up on labs from [facility] ? ?Diabetes (A1c goal <6.5%) ?-Controlled ?-Current medications: ?Metformin '1000mg'$  BID with meals ?-Medications previously tried: insulin glargine  ?-Current home glucose readings ?fasting glucose: 128, 131, 150, 145, 126, 119 ?-Denies hypoglycemic/hyperglycemic symptoms ?-Educated on A1c and blood sugar goals; ?Prevention and management of hypoglycemic episodes; ?Carbohydrate counting and/or plate method ?-Counseled to check feet daily and get yearly eye exams ?-Recommended to continue current medication ? ?Patient Goals/Self-Care Activities ?Patient will:  ?- take medications as prescribed ?check glucose daily, document, and provide at future appointments ?check blood pressure weekly, document, and provide at future appointments ? ?Follow Up Plan: The care management team will reach out to the patient again over the next 180 days.  ? ?  ?  ? ?The patient verbalized understanding of instructions, educational materials, and care plan provided today and declined offer to receive copy of patient instructions, educational materials, and care plan.  ?Telephone follow up appointment with pharmacy team member scheduled for: 6 months ? ?Edythe Clarity, [facility]  ?Beverly Milch, PharmD ?Clinical Pharmacist  ?[place] ?([phone] ? ?

## 2021-11-11 ENCOUNTER — Telehealth: Payer: Self-pay | Admitting: Pharmacist

## 2021-11-11 NOTE — Progress Notes (Signed)
Erroneous Encounter

## 2021-11-16 NOTE — Unmapped (Signed)
The [facility] has made a second and final attempt to reach this patient to refill the following medication: .  tacrolimus 0.5 MG capsule (PROGRAF)    We have left voicemails on the following phone numbers: [phone].    Dates contacted:  11/10/21   11/16/21  Last scheduled delivery: 10/22/21    The patient may be at risk of non-compliance with this medication. The patient should call the [facility] at [phone]  Option 4, then Option 2 (all other specialty patients) to refill medication.    [place] A Sheri Gatchel   [facility] Specialty Technician

## 2021-11-16 NOTE — Unmapped (Signed)
[facility] Refill Coordination Note    Specialty Medication(s) to be Shipped:   Transplant: tacrolimus 0.5mg  and General Specialty: mycophenolate 500mg     Other medication(s) to be shipped: Chlorthalidone 25mg , Dilitiazem 180mg , Levothyroxine , Accu-Chek Test strips, Amlopidine 10mg , ASA 81mg , Famotidine 20mg      Andrew Dickerson, DOB: [birth year]  Phone: There are no phone numbers on file.      All above HIPAA information was verified with patient.     Was a Nurse, learning disability used for this call? No    Completed refill call assessment today to schedule patient's medication shipment from the [facility] [phone]).  All relevant notes have been reviewed.     Specialty medication(s) and dose(s) confirmed: Regimen is correct and unchanged.   Changes to medications: [name] reports no changes at this time.  Changes to insurance: No  New side effects reported not previously addressed with a pharmacist or physician: None reported  Questions for the pharmacist: No    Confirmed patient received a Conservation officer, historic buildings and a Surveyor, mining with first shipment. The patient will receive a drug information handout for each medication shipped and additional FDA Medication Guides as required.       DISEASE/MEDICATION-SPECIFIC INFORMATION        N/A    SPECIALTY MEDICATION ADHERENCE     Medication Adherence    Patient reported X missed doses in the last month: 1  Specialty Medication: Tacrolimus 0.5mg   Patient is on additional specialty medications: Yes  Additional Specialty Medications: Mycophenolate 500mg   Patient Reported Additional Medication X Missed Doses in the Last Month: 1  Patient is on more than two specialty medications: No              Were doses missed due to medication being on hold? No    Mucophenolate 500 mg: 7-10 days of medicine on hand   Tacrolimus 0.5 mg: 7-10 days of medicine on hand       REFERRAL TO PHARMACIST     Referral to the pharmacist: Not needed      [facility]     Shipping address confirmed in Epic.     Delivery Scheduled: Yes, Expected medication delivery date: 11/19/21.     Medication will be delivered via UPS to the prescription address in Epic WAM.    Andrew Dickerson [facility] Specialty Technician

## 2021-11-18 DIAGNOSIS — E0800 Diabetes mellitus due to underlying condition with hyperosmolarity without nonketotic hyperglycemic-hyperosmolar coma (NKHHC): Principal | ICD-10-CM

## 2021-11-18 MED FILL — ASPIRIN 81 MG TABLET,DELAYED RELEASE: ORAL | 90 days supply | Qty: 90 | Fill #1

## 2021-11-18 MED FILL — FAMOTIDINE 20 MG TABLET: ORAL | 90 days supply | Qty: 180 | Fill #1

## 2021-11-18 MED FILL — AMLODIPINE 10 MG TABLET: ORAL | 90 days supply | Qty: 90 | Fill #1

## 2021-11-18 MED FILL — ACCU-CHEK AVIVA PLUS TEST STRIPS: 33 days supply | Qty: 200 | Fill #2

## 2021-11-18 MED FILL — TACROLIMUS 0.5 MG CAPSULE, IMMEDIATE-RELEASE: ORAL | 30 days supply | Qty: 90 | Fill #8

## 2021-11-18 MED FILL — LEVOTHYROXINE 75 MCG TABLET: ORAL | 30 days supply | Qty: 30 | Fill #4

## 2021-11-18 MED FILL — MYCOPHENOLATE MOFETIL 500 MG TABLET: ORAL | 90 days supply | Qty: 180 | Fill #1

## 2021-11-18 MED FILL — CHLORTHALIDONE 25 MG TABLET: ORAL | 30 days supply | Qty: 30 | Fill #4

## 2021-11-18 MED FILL — DILTIAZEM CD 180 MG CAPSULE,EXTENDED RELEASE 24 HR: ORAL | 30 days supply | Qty: 60 | Fill #4

## 2021-12-01 MED ORDER — VALSARTAN 160 MG TABLET
ORAL_TABLET | Freq: Every day | ORAL | 3 refills | 90 days | Status: CP
Start: 2021-12-01 — End: 2022-12-01
  Filled 2021-12-02: qty 180, 90d supply, fill #0

## 2021-12-02 NOTE — Progress Notes (Signed)
? ?Phone: [phone] ?  ?Subjective:  ?Patient presents today for their annual physical. Chief complaint-noted.  ? ?See problem oriented charting- ?ROS- full  review of systems was completed and negative  ?except for: trouble swallowing at times- feels like cold drinks get stuck- no problem with solid food ? ?The following were reviewed and entered/updated in epic: ?Past Medical History:  ?Diagnosis Date  ? Abdominal pain, epigastric 03/31/2009  ? Acute on chronic systolic heart failure ([place]) 03/02/2009  ? ANEMIA, OTHER UNSPEC 04/03/2008  ? AUTOMATIC IMPLANTABLE CARDIAC DEFIBRILLATOR SITU 02/26/2009  ? on old heart  ? Blood transfusion without reported diagnosis   ? CHF (congestive heart failure) ([place])   ? CORONARY ARTERY DISEASE 04/25/2007  ? DIABETES MELLITUS, TYPE II 12/13/2008  ? no meds, diet contolled  ? GERD (gastroesophageal reflux disease)   ? HYPERLIPIDEMIA 04/25/2007  ? Hypertension   ? HYPERTENSION, HX OF 05/14/2008  ? 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  ? MYOCARDIAL INFARCTION, 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  ? OBESITY 02/26/2009  ? Thyroid disease   ? THYROID DISEASE, HX OF 05/14/2008  ? VENTRICULAR TACHYCARDIA 02/26/2009  ? ?Patient Active Problem List  ? Diagnosis Date Noted  ? Immunosuppressed status ([place]) 02/23/2019  ?  Priority: High  ? Heart transplanted ([place]) 06/19/2014  ?  Priority: High  ? Chronic systolic heart failure ([place]) 03/02/2009  ?  Priority: High  ? 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  ?  Priority: High  ? AUTOMATIC IMPLANTABLE CARDIAC DEFIBRILLATOR SITU 02/26/2009  ?  Priority: High  ? Diabetes mellitus type II, controlled ([place]) 12/13/2008  ?  Priority: High  ? Coronary atherosclerosis 04/25/2007  ?  Priority: High  ? Chronic abdominal pain 12/27/2015  ?  Priority: Medium   ? BPH (benign prostatic hypertrophy) 02/26/2014  ?  Priority: Medium   ? Hypothyroidism 05/14/2008  ?  Priority: Medium   ? Hypertension associated with diabetes ([place]) 05/14/2008  ?  Priority: Medium   ? ANEMIA, OTHER UNSPEC  04/03/2008  ?  Priority: Medium   ? Hyperlipidemia associated with type 2 diabetes mellitus ([place]) 04/25/2007  ?  Priority: Medium   ? GERD (gastroesophageal reflux disease) 06/19/2014  ?  Priority: Low  ? Osteoarthritis 06/19/2014  ?  Priority: Low  ? History of adenomatous polyp of colon 09/17/2013  ?  Priority: Low  ? ?Past Surgical History:  ?Procedure Laterality Date  ? ANGIOPLASTY    ? with stent  ? CARDIAC ASSIST DEVICE REMOVAL    ? CARDIAC CATHETERIZATION    ? COLONOSCOPY    ? CORONARY ARTERY BYPASS GRAFT    ? 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  ? LEFT VENTRICULAR ASSIST DEVICE  05/2009  ? [facility]  ? ? ?Family History  ?Problem Relation Age of Onset  ? Heart disease Mother   ? Lung cancer Maternal Grandmother   ? Lung cancer Maternal Grandfather   ? Colon cancer Neg Hx   ? Esophageal cancer Neg Hx   ? Prostate cancer Neg Hx   ? Rectal cancer Neg Hx   ? Stomach cancer Neg Hx   ? ? ?Medications- reviewed and updated ?Current Outpatient Medications  ?Medication Sig Dispense Refill  ? amLODipine (NORVASC) 10 MG tablet Take 10 mg by mouth daily. Per [facility]    ? aspirin EC 81 MG tablet Take 1 tablet (81 mg total) by mouth daily. 30 tablet 0  ? atorvastatin (LIPITOR) 80 MG tablet Take 1 tablet (80 mg total) by mouth daily at 6 PM. 30 tablet 0  ?  chlorthalidone (HYGROTON) 25 MG tablet Take 1 tablet (25 mg total) by mouth daily. 30 tablet 0  ? cholecalciferol (VITAMIN D) 1000 units tablet Take 3 tablets (3,000 Units total) by mouth daily. 90 tablet 0  ? diltiazem (TIAZAC) 180 MG 24 hr capsule Take 2 capsules (360 mg total) by mouth daily. 60 capsule 0  ? ezetimibe (ZETIA) 10 MG tablet Take 1 tablet by mouth once daily 90 tablet 0  ? levothyroxine (SYNTHROID, LEVOTHROID) 75 MCG tablet Take 1 tablet (75 mcg total) by mouth daily before breakfast. 30 tablet 0  ? magnesium oxide (MAG-OX) 400 MG tablet Take 1.5 tablets (600 mg total) by mouth 2 (two) times daily. 45 tablet 0  ? metFORMIN (GLUCOPHAGE) 1000 MG tablet TAKE 1 TABLET BY  MOUTH TWICE DAILY WITH MEALS 180 tablet 0  ? mycophenolate (CELLCEPT) 500 MG tablet Take 1 tablet (500 mg total) by mouth 2 (two) times daily. 60 tablet 0  ? tacrolimus (PROGRAF) 0.5 MG capsule Take 2 capsules (1 mg total) by mouth 2 (two) times daily. (Patient taking differently: Take by mouth 2 (two) times daily. 2 tablets in the AM ?1 tablet in the PM) 120 capsule 0  ? tamsulosin (FLOMAX) 0.4 MG CAPS capsule Take 0.4 mg by mouth.    ? valsartan (DIOVAN) 160 MG tablet Take 320 mg by mouth daily.     ? famotidine (PEPCID) 20 MG tablet Take 20 mg by mouth in the morning and at bedtime.    ? ?No current facility-administered medications for this visit.  ? ? ?Allergies-reviewed and updated ?No Known Allergies ? ?Social History  ? ?Social History Narrative  ? Single. Divorced. 1 son. 1 granddaughter 2014.   ?   ? 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   ?   ? Hobbies: time on CPU  ?   ?   ?   ? ?Objective  ?Objective:  ?BP 126/62   Pulse 82   Temp (!) 97 ?F (36.1 ?C)   Ht '5\' 8"'$  (1.727 m)   Wt 234 lb (106.1 kg)   SpO2 98%   BMI 35.58 kg/m?  ?Gen: NAD, resting comfortably ?HEENT: Mucous membranes are moist. Oropharynx normal ?Neck: no thyromegaly ?CV: RRR no murmurs rubs or gallops ?Lungs: CTAB no crackles, wheeze, rhonchi ?Abdomen: soft/nontender/nondistended/normal bowel sounds. No rebound or guarding. Stable umbilical hernia ?Ext: no edema ?Skin: warm, dry ?Neuro: grossly normal, moves all extremities, PERRLA ? ?  ?Assessment and Plan  ?66 y.o. male presenting for annual physical.  ?Health Maintenance counseling: ?1. Anticipatory guidance: Patient counseled regarding regular dental exams -wears dentures, eye exams - needs to schedule yearly,  avoiding smoking and second hand smoke , limiting alcohol to 2 beverages per day-rare social.  No illicit drugs ?2. Risk factor reduction:  Advised patient of need for regular exercise and diet rich and fruits and vegetables to reduce risk of heart  attack and stroke.  ?Exercise-currently not exercising much- last year had patellar tendinitis- thinks he can restart at this time- ease in gradually  ?Diet/weight management- down 2 lbs from last visit- would love to see grdual continued weight loss. Feels he could improve diet such as plant intake ?Wt Readings from Last 3 Encounters:  ?12/29/21 234 lb (106.1 kg)  ?06/22/21 235 lb 12.8 oz (107 kg)  ?06/18/21 236 lb 6.4 oz (107.2 kg)  ?3. Immunizations/screenings/ancillary studies ?DISCUSSED:  ?COVID booster vaccine #4- still planning on doing this ?-Shingrix vaccine - considering- cheaper at pharmacy or  ask [facility] ?-Vision Exam- advised to get ?-TDAP vaccine - cheaper at pharmacy or ask [facility] ?Immunization History  ?Administered Date(s) Administered  ? Hepatitis A, Adult 06/09/2012  ? Hepatitis B 09/14/2011  ? Hepatitis B, adult 05/08/2009, 09/14/2011, 06/09/2012  ? HiB (PRP-OMP) 05/02/2009  ? Influenza Split 07/02/2011  ? Influenza Whole 06/25/2008  ? Influenza, Seasonal, Injecte, Preservative Fre 08/06/2010, 06/09/2012  ? Influenza,inj,Quad PF,6+ Mos 08/07/2013, 06/19/2014, 08/29/2015, 08/20/2016, 06/07/2018, 05/22/2019, 06/16/2020  ? Influenza-Unspecified 06/19/2014, 06/16/2020  ? PFIZER(Purple Top)SARS-COV-2 Vaccination 11/04/2019, 12/01/2019, 10/22/2020  ? PPD Test 05/04/2009, 09/14/2011, 06/09/2012  ? Pneumococcal Conjugate-13 07/18/2012  ? Pneumococcal Polysaccharide-23 09/06/2009, 06/09/2012, 02/23/2019  ? Pneumococcal-Unspecified 05/02/2009  ? Td 05/02/2009  ? Tdap 09/14/2011  ? Zoster Recombinat (Shingrix) 12/16/2006  ?4. Prostate cancer screening-  PSA was 0.7 in 2022- wants to check with [facility] next visit ?Lab Results  ?Component Value Date  ? PSA 0.70 10/22/2020  ? PSA 0.5 01/27/2017  ? PSA 0.67 03/05/2008  ? 5. Colon cancer screening - 04/25/17 with a 10 year repeat planned ?6. Skin cancer screening-  plans to schedule derm visit- encouraged to schedule. advised regular sunscreen use. Denies worrisome,  changing, or new skin lesions.  ?7. Smoking associated screening (lung cancer screening, AAA screen 65-75, UA)- FORMER smoker- quit smoking 1992 per his report- passed any screening requirements other than potentially AAA

## 2021-12-04 DIAGNOSIS — E1159 Type 2 diabetes mellitus with other circulatory complications: Secondary | ICD-10-CM | POA: Diagnosis not present

## 2021-12-04 DIAGNOSIS — I10 Essential (primary) hypertension: Secondary | ICD-10-CM

## 2021-12-04 DIAGNOSIS — Z7984 Long term (current) use of oral hypoglycemic drugs: Secondary | ICD-10-CM | POA: Diagnosis not present

## 2021-12-04 DIAGNOSIS — E785 Hyperlipidemia, unspecified: Secondary | ICD-10-CM | POA: Diagnosis not present

## 2021-12-10 DIAGNOSIS — Z941 Heart transplant status: Secondary | ICD-10-CM | POA: Diagnosis not present

## 2021-12-10 DIAGNOSIS — Z48298 Encounter for aftercare following other organ transplant: Secondary | ICD-10-CM | POA: Diagnosis not present

## 2021-12-10 DIAGNOSIS — R7989 Other specified abnormal findings of blood chemistry: Secondary | ICD-10-CM | POA: Diagnosis not present

## 2021-12-10 DIAGNOSIS — Z79899 Other long term (current) drug therapy: Secondary | ICD-10-CM | POA: Diagnosis not present

## 2021-12-10 DIAGNOSIS — E559 Vitamin D deficiency, unspecified: Secondary | ICD-10-CM | POA: Diagnosis not present

## 2021-12-10 DIAGNOSIS — E785 Hyperlipidemia, unspecified: Secondary | ICD-10-CM | POA: Diagnosis not present

## 2021-12-10 LAB — LIPID PANEL
HDL: 33 — AB (ref 35–70)
LDL Cholesterol: 63
Triglycerides: 118 (ref 40–160)
Triglycerides: 135 (ref 40–160)

## 2021-12-10 LAB — HEMOGLOBIN A1C: Hemoglobin A1C: 6.4

## 2021-12-11 MED ORDER — ATORVASTATIN 80 MG TABLET
ORAL_TABLET | Freq: Every day | ORAL | 3 refills | 90 days | Status: CP
Start: 2021-12-11 — End: 2022-12-11
  Filled 2021-12-17: qty 90, 90d supply, fill #0

## 2021-12-11 NOTE — Unmapped (Signed)
[facility] Refill Coordination Note    Specialty Medication(s) to be Shipped:   Transplant: tacrolimus 0.5mg     Other medication(s) to be shipped: atorvastatin, chlorthalidone, diltiazem and levothyroxine     Andrew Dickerson, DOB: Sep 03, 1956  Phone: There are no phone numbers on file.      All above HIPAA information was verified with patient.     Was a Nurse, learning disability used for this call? No    Completed refill call assessment today to schedule patient's medication shipment from the [facility] [place] [facility] [phone]).  All relevant notes have been reviewed.     Specialty medication(s) and dose(s) confirmed: Regimen is correct and unchanged.   Changes to medications: Andrew Dickerson reports no changes at this time.  Changes to insurance: No  New side effects reported not previously addressed with a pharmacist or physician: None reported  Questions for the pharmacist: No    Confirmed patient received a Conservation officer, historic buildings and a Surveyor, mining with first shipment. The patient will receive a drug information handout for each medication shipped and additional FDA Medication Guides as required.       DISEASE/MEDICATION-SPECIFIC INFORMATION        N/A    SPECIALTY MEDICATION ADHERENCE     Medication Adherence    Patient reported X missed doses in the last month: 0  Specialty Medication: Tacrolimus 0.5mg   Patient is on additional specialty medications: No         Were doses missed due to medication being on hold? No    Tacrolimus 0.5 mg: 10 days of medicine on hand     REFERRAL TO PHARMACIST     Referral to the pharmacist: Not needed      [facility]     Shipping address confirmed in Epic.     Delivery Scheduled: Yes, Expected medication delivery date: 12/18/2021.     Medication will be delivered via UPS to the prescription address in Epic WAM.    Andrew Dickerson [facility] Specialty Technician

## 2021-12-14 ENCOUNTER — Ambulatory Visit: Admit: 2021-12-14 | Discharge: 2021-12-14 | Payer: MEDICARE

## 2021-12-14 ENCOUNTER — Encounter
Admit: 2021-12-14 | Discharge: 2021-12-14 | Payer: MEDICARE | Attending: Cardiovascular Disease | Primary: Cardiovascular Disease

## 2021-12-14 DIAGNOSIS — I10 Essential (primary) hypertension: Secondary | ICD-10-CM | POA: Diagnosis not present

## 2021-12-14 DIAGNOSIS — Z01810 Encounter for preprocedural cardiovascular examination: Secondary | ICD-10-CM | POA: Diagnosis not present

## 2021-12-14 DIAGNOSIS — Z941 Heart transplant status: Secondary | ICD-10-CM | POA: Diagnosis not present

## 2021-12-14 DIAGNOSIS — E785 Hyperlipidemia, unspecified: Secondary | ICD-10-CM | POA: Diagnosis not present

## 2021-12-14 DIAGNOSIS — R937 Abnormal findings on diagnostic imaging of other parts of musculoskeletal system: Secondary | ICD-10-CM | POA: Diagnosis not present

## 2021-12-14 DIAGNOSIS — Z4821 Encounter for aftercare following heart transplant: Secondary | ICD-10-CM | POA: Diagnosis not present

## 2021-12-14 DIAGNOSIS — R9431 Abnormal electrocardiogram [ECG] [EKG]: Secondary | ICD-10-CM | POA: Diagnosis not present

## 2021-12-14 LAB — CBC
HEMATOCRIT: 40 % (ref 39.0–48.0)
HEMOGLOBIN: 13.4 g/dL (ref 12.9–16.5)
MEAN CORPUSCULAR HEMOGLOBIN CONC: 33.6 g/dL (ref 32.0–36.0)
MEAN CORPUSCULAR HEMOGLOBIN: 29.2 pg (ref 25.9–32.4)
MEAN CORPUSCULAR VOLUME: 86.9 fL (ref 77.6–95.7)
MEAN PLATELET VOLUME: 8 fL (ref 6.8–10.7)
PLATELET COUNT: 297 10*9/L (ref 150–450)
RBC: 4.6 (ref 3.87–5.11)
RED BLOOD CELL COUNT: 4.6 10*12/L (ref 4.26–5.60)
RED CELL DISTRIBUTION WIDTH: 13.9 % (ref 12.2–15.2)
WBC ADJUSTED: 10.1 10*9/L (ref 3.6–11.2)

## 2021-12-14 LAB — BASIC METABOLIC PANEL
ANION GAP: 9 mmol/L (ref 5–14)
BLOOD UREA NITROGEN: 26 mg/dL — ABNORMAL HIGH (ref 9–23)
BUN / CREAT RATIO: 19
CALCIUM: 9.7 mg/dL (ref 8.7–10.4)
CHLORIDE: 107 mmol/L (ref 98–107)
CO2: 22 (ref 13–22)
CO2: 22 mmol/L (ref 20.0–31.0)
CREATININE: 1.39 mg/dL — ABNORMAL HIGH
Chloride: 107 (ref 99–108)
Creatinine: 1.4 — AB (ref 0.6–1.3)
EGFR CKD-EPI (2021) MALE: 57 mL/min/{1.73_m2} — ABNORMAL LOW (ref >=60)
GLUCOSE RANDOM: 165 mg/dL (ref 70–179)
Glucose: 165
POTASSIUM: 4.2 mmol/L (ref 3.4–4.8)
Potassium: 4.2 mEq/L (ref 3.5–5.1)
Potassium: 42 mEq/L — AB (ref 3.5–5.1)
SODIUM: 138 mmol/L (ref 135–145)
Sodium: 138 (ref 137–147)

## 2021-12-14 LAB — CBC AND DIFFERENTIAL
HCT: 40 — AB (ref 41–53)
Hemoglobin: 13.4 — AB (ref 13.5–17.5)
Platelets: 297 10*3/uL (ref 150–400)
WBC: 10.1

## 2021-12-14 LAB — COMPREHENSIVE METABOLIC PANEL
Calcium: 6.7 — AB (ref 8.7–10.7)
Calcium: 9.7 (ref 8.7–10.7)

## 2021-12-14 LAB — HEPATIC FUNCTION PANEL: ALT: 18 U/L (ref 10–40)

## 2021-12-14 MED ADMIN — verapamiL (ISOPTIN) injection: INTRA_ARTERIAL | @ 15:00:00 | Stop: 2021-12-14

## 2021-12-14 MED ADMIN — fentaNYL (PF) (SUBLIMAZE) injection: INTRAVENOUS | @ 15:00:00 | Stop: 2021-12-14

## 2021-12-14 MED ADMIN — iohexoL (OMNIPAQUE) 350 mg iodine/mL solution: @ 15:00:00 | Stop: 2021-12-14

## 2021-12-14 MED ADMIN — heparin (porcine) in NS Manifold Flush: @ 15:00:00 | Stop: 2021-12-14

## 2021-12-14 MED ADMIN — heparin (porcine) 1000 unit/mL injection: INTRAVENOUS | @ 15:00:00 | Stop: 2021-12-14

## 2021-12-14 MED ADMIN — lidocaine (PF) (XYLOCAINE-MPF) 20 mg/mL (2 %) injection: SUBCUTANEOUS | @ 15:00:00 | Stop: 2021-12-14

## 2021-12-14 MED ADMIN — iohexoL (OMNIPAQUE) 350 mg iodine/mL solution: INTRACORONARY | @ 15:00:00 | Stop: 2021-12-14

## 2021-12-14 NOTE — Unmapped (Signed)
Cardiac Catheterization Laboratory  [place], [place]  Tel: [phone]     Fax: [phone]       HISTORY & PHYSICAL ASSESSMENT    PCP:  Shelva Majestic, MD  Phone:  [phone]  Fax:  [phone]    Referring Physicians:  Kandis Mannan, Md  [address]  [address]  [place],  [place] [address]     Primary Cardiologist:  [facility] - Dr Cherly Hensen    History: Mr. Andrew Dickerson is a 66 y.o.male who is s/p OHT (07/2012) without known CAV, HTN, HLD, DM, CKD (b/l 1.3-1.6) who presents for [facility] for CAV surveillance.     Exam:  GEN: No acute distress. Appears stated age.  RESP: Normal respiratory effort. CTAB.  CV: Regular, normal rate. No murmur or gallop. 2+ UE pulses, extremities warm. Neck veins flat, no LE edema.  GI: Abdomen non-tender. No hepatomegaly.  MSK: No misalignment or limited ROM of extremity joints.  SKIN: Exposed skin without rashes or induration.  NEURO: CN grossly intact. No focal sensorimotor deficit.  PSYCH: Normal affect and insight.      hypertension  hyperlipidemia  diabetes mellitus    Tobacco use: reports that he has quit smoking. He has a 20.00 pack-year smoking history. He has never used smokeless tobacco.      No known history of prior PCI.    No known history of prior MI.    No known history of prior CABG.    Prior stage D HF s/p OHT in 2013.      No cardiac arrest surrounding this admission.    Assessments:    ECG :  NSR, nonspecific ST and TWA     Stress Test : No stress test performed    No new antiarrhythmic therapy initiated prior to cath lab.    No cardiac CTA performed    Prior angiogram WITHOUT intervention no angiographically significant CAV    An EF of 60-65% was obtained on 05/2021.     No Agatston coronary calcium score was assessed.    CSHA Clinical Frailty Scale : 3 - Managing Well    Chest Pain Assessment : asymptomatic     Cardiovascular Instability : NA    Medications Administered : (pre-procedure)  Aspirin    Medications Contraindicated :   None documented    The patient's estimated bleeding risk is 1.2%.    Strategies used to mitigate risk include: radial access if possible.

## 2021-12-14 NOTE — Unmapped (Addendum)
FINAL CARDIAC CATHETERIZATION REPORT    CONCLUSIONS:  - No significant coronary artery disease      RECOMMENDATIONS:  - Per Transplant team        PATIENT NAME: Andrew Dickerson  INDICATION: s/p heart transplant  PROCEDURE DATE: 2021-12-14  ACCESS SITE: right radial artery  PHYSICIANS: Angelita Ingles (Attending), Corinne Ports (Fellow - Diagnostic)  REFERRING: Cherly Hensen    Diagnostic procedures: coronary angiography, left heart cath  Contrast Used (ml): 30      INTERVENTIONAL FINDINGS:  No interventions performed    DIAGNOSTIC FINDINGS:  Coronary Angiography:  - Dominance: co-dominant (small right PDA, wrap around LAD)     Coronary Findings:  Left Main:  - Ostial Left Main: No signficant stenosis (large caliber)  - Left Main: No signficant stenosis (large caliber)    Left Anterior Descending:  - Ostial LAD: No signficant stenosis (large caliber)  - Proximal LAD: No signficant stenosis (large caliber)  - Mid LAD: No signficant stenosis (moderate to large caliber)  - Distal LAD: No signficant stenosis (moderate caliber)  - First diagonal: (small caliber, branching)  - Second diagonal: (small caliber, branching)    Left Circumflex:  - Ostial LCx: No signficant stenosis (large caliber)  - Proximal LCx: No signficant stenosis (large caliber)  - Mid LCx: No signficant stenosis (moderate caliber)  - Distal LCx: No signficant stenosis (small caliber)  - OM1: No signficant stenosis (moderate caliber)  - OM2: (large caliber, branching)  - OM3: No signficant stenosis (small caliber)  - OM4: No signficant stenosis (small caliber)    Right coronary artery:  - Ostial RCA: No signficant stenosis (moderate caliber)  - Proximal RCA: No signficant stenosis (moderate caliber)  - Mid RCA: No signficant stenosis (moderate caliber)  - Distal RCA: mild irregularity (small to moderate caliber)  - PDA: mild irregularity (small caliber)  - RCA continuation: No signficant stenosis (tiny caliber)    Left Ventriculogram:  - Not performed    Hemodynamics:   Left Heart:  Aorta: 130/70  Mean: 88  LVEDP: (pre-A): 10  (post-A): 15     Technique: The right radial artery was accessed with an angiocath and a 60F sheath was placed. 3mg  of verapamil was administered via the sheath. After accessing the ascending aorta, heparin was administered. At the end of the procedure, a TR band was placed to achieve hemostasis.    Catheters used: 60F JL 3.5, 60F JR 4  Complications: none reported  Estimated blood loss: none reported  Radiation: Fluoro time (min): 7.2, Air Kerma Dose (mGy): 306.2, DAP (Gy-cm2):: 29.2      I have reviewed the recent history physical and documentation.    I personally spent 20 minutes, continuously monitoring the patient face to face during the administration of moderate sedation. Independent observer RN was present for the duration of the procedure to assist in patient monitoring. Pre and post sedation activities have been reviewed.  I (Dr. Angelita Ingles) was present for the entire procedure.

## 2021-12-14 NOTE — Unmapped (Signed)
Cardiac Catheterization Laboratory  [place], [place]  Tel: [phone]     Fax: [phone]       HISTORY & PHYSICAL ASSESSMENT    PCP:  Shelva Majestic, MD  Phone:  [phone]  Fax:  [phone]    Referring Physicians:  Kandis Mannan, Md  [address]  [address]  [place],  [place] [address]     Primary Cardiologist:  Dr. Cherly Hensen    Procedures to be performed:   Coronary angiography, Possible Percutaneous Coronary Intervention, Possible Left Heart Catheterization, Possible Left Ventriculogram     Indication:  S/p HTX    Consent:  I hereby certify that the nature, purpose, benefits, usual and most frequent risks of, and alternatives to, the operation or procedure have been explained to the patient (or person authorized to sign for the patient) either by a physician or by the provider who is to perform the operation or procedure, that the patient has had an opportunity to ask questions, and that those questions have been answered. The patient or the patient's representative has been advised that selected tasks may be performed by assistants to the primary health care provider(s). I believe that the patient (or person authorized to sign for the patient) understands what has been explained, and has consented to the operation or procedure.  _____________________________________________________________________    HISTORY:    66 yo M  w/PMHx of heart transplant 2/2 ICM in 2013, HTN, referred by Dr. Cherly Hensen for [facility]     His last cath in 08/2019 without apparent CAD. He is CP free now.    Risk factors for coronary artery disease include:  hypertension  hyperlipidemia  Other history includes:  Never Smoked  No known history of prior PCI.  No known history of prior MI.  No known history of prior CABG.       Assessments:  ECG :  normal   Stress Test : No stress test performed  No new antiarrhythmic therapy initiated prior to cath lab.  No cardiac CTA performed  Prior angiogram WITHOUT intervention demonstrated no evidence of CAD on the date of 07/2019.  An EF of 60-65% was obtained on 05/2021.   No Agatston coronary calcium score was assessed.  CSHA Clinical Frailty Scale : 3 - Managing Well  Chest Pain Assessment : asymptomatic        Medications Administered : (pre-procedure)  Aspirin    Medications Contraindicated :   None documented    Visit Vitals  Temp 36.8 ??C (98.2 ??F) (Oral)   Ht 172.7 cm (5' 8)   Wt (!) 105.9 kg (233 lb 6.4 oz)   BMI 35.49 kg/m??     General: Alert, NAD, sitting up in bed  HEENT: Sclera anicteric, MMM, no LAD  Cardiac: normal rate, regular rhythm, no murmurs rubs or gallops. No JVD  Pulmonary: CTAB, no increased WOB  Abdomen: Soft, non-tender, non distended. NABS  Extremities: No LE edema  Neuro: AAOx4. No focal deficits     Labs and imaging were reviewed pending    The patient's estimated bleeding risk is .     Strategies used to mitigate risk include:  RRA access

## 2021-12-14 NOTE — Unmapped (Signed)
Pt is ready for discharge, verbalizes understanding of discharge instructions. Rt radial cath site without bleeding or hematoma noted, statseal covering.

## 2021-12-15 LAB — CBC W/ DIFFERENTIAL
BASOPHILS ABSOLUTE COUNT: 90 {cells}/uL (ref 0–200)
BASOPHILS RELATIVE PERCENT: 1 %
EOSINOPHILS ABSOLUTE COUNT: 207 {cells}/uL (ref 15–500)
EOSINOPHILS RELATIVE PERCENT: 2.3 %
HEMATOCRIT: 39.1 % (ref 38.5–50.0)
HEMOGLOBIN: 12.8 g/dL — ABNORMAL LOW (ref 13.2–17.1)
LYMPHOCYTES ABSOLUTE COUNT: 1611 {cells}/uL (ref 850–3900)
LYMPHOCYTES RELATIVE PERCENT: 17.9 %
MEAN CORPUSCULAR HEMOGLOBIN CONC: 32.7 g/dL (ref 32.0–36.0)
MEAN CORPUSCULAR HEMOGLOBIN: 29.6 pg (ref 27.0–33.0)
MEAN CORPUSCULAR VOLUME: 90.5 fL (ref 80.0–100.0)
MEAN PLATELET VOLUME: 9.8 fL (ref 7.5–12.5)
MONOCYTES ABSOLUTE COUNT: 648 {cells}/uL (ref 200–950)
MONOCYTES RELATIVE PERCENT: 7.2 %
NEUTROPHILS ABSOLUTE COUNT: 6444 {cells}/uL (ref 1500–7800)
NEUTROPHILS RELATIVE PERCENT: 71.6 %
PLATELET COUNT: 271 10*3/uL (ref 140–400)
RED BLOOD CELL COUNT: 4.32 10*6/uL (ref 4.20–5.80)
RED CELL DISTRIBUTION WIDTH: 12.7 % (ref 11.0–15.0)
WHITE BLOOD CELL COUNT: 9 10*3/uL (ref 3.8–10.8)

## 2021-12-15 LAB — COMPREHENSIVE METABOLIC PANEL
ALBUMIN/GLOBULIN RATIO: 1.9 (calc) (ref 1.0–2.5)
ALBUMIN: 4.4 g/dL (ref 3.6–5.1)
ALKALINE PHOSPHATASE: 79 U/L (ref 35–144)
ALT (SGPT): 18 U/L (ref 9–46)
AST (SGOT): 11 U/L (ref 10–35)
BILIRUBIN TOTAL: 0.5 mg/dL (ref 0.2–1.2)
BLOOD UREA NITROGEN: 23 mg/dL (ref 7–25)
CALCIUM: 9.3 mg/dL (ref 8.6–10.3)
CHLORIDE: 104 mmol/L (ref 98–110)
CO2: 21 mmol/L (ref 20–32)
CREATININE: 1.31 mg/dL (ref 0.70–1.35)
EGFR CKD-EPI NON-AA MALE: 61 mL/min
GLOBULIN, TOTAL: 2.3 g/dL (ref 1.9–3.7)
GLUCOSE RANDOM: 142 mg/dL — ABNORMAL HIGH (ref 65–99)
POTASSIUM: 4.5 mmol/L (ref 3.5–5.3)
PROTEIN TOTAL: 6.7 g/dL (ref 6.1–8.1)
SODIUM: 135 mmol/L (ref 135–146)

## 2021-12-15 LAB — LIPID PANEL
CHOLESTEROL/HDL RATIO SCREEN: 3.6 (calc)
CHOLESTEROL: 118 mg/dL
HDL CHOLESTEROL: 33 mg/dL — ABNORMAL LOW
LDL CHOLESTEROL CALCULATED: 63 mg/dL
NON-HDL CHOLESTEROL: 85 mg/dL
TRIGLYCERIDES: 135 mg/dL

## 2021-12-15 LAB — TACROLIMUS LEVEL: TACROLIMUS BLOOD: 4.7 ug/L

## 2021-12-15 LAB — HEMOGLOBIN A1C: HEMOGLOBIN A1C: 6.4 % — ABNORMAL HIGH

## 2021-12-15 LAB — TSH: THYROID STIMULATING HORMONE: 1.78 m[IU]/L (ref 0.40–4.50)

## 2021-12-15 LAB — MAGNESIUM: MAGNESIUM: 1.7 mg/dL (ref 1.5–2.5)

## 2021-12-15 LAB — VITAMIN D 25 HYDROXY: VITAMIN D, TOTAL (25OH): 52 ng/mL (ref 30–100)

## 2021-12-16 DIAGNOSIS — Z941 Heart transplant status: Principal | ICD-10-CM

## 2021-12-17 MED FILL — LEVOTHYROXINE 75 MCG TABLET: ORAL | 30 days supply | Qty: 30 | Fill #5

## 2021-12-17 MED FILL — CHLORTHALIDONE 25 MG TABLET: ORAL | 30 days supply | Qty: 30 | Fill #5

## 2021-12-17 MED FILL — TACROLIMUS 0.5 MG CAPSULE, IMMEDIATE-RELEASE: ORAL | 30 days supply | Qty: 90 | Fill #9

## 2021-12-17 MED FILL — DILTIAZEM CD 180 MG CAPSULE,EXTENDED RELEASE 24 HR: ORAL | 30 days supply | Qty: 60 | Fill #5

## 2021-12-29 ENCOUNTER — Encounter: Payer: Self-pay | Admitting: Family Medicine

## 2021-12-29 ENCOUNTER — Ambulatory Visit (INDEPENDENT_AMBULATORY_CARE_PROVIDER_SITE_OTHER): Payer: Medicare HMO | Admitting: Family Medicine

## 2021-12-29 VITALS — BP 126/62 | HR 82 | Temp 97.0°F | Ht 68.0 in | Wt 234.0 lb

## 2021-12-29 DIAGNOSIS — D849 Immunodeficiency, unspecified: Secondary | ICD-10-CM

## 2021-12-29 DIAGNOSIS — E1169 Type 2 diabetes mellitus with other specified complication: Secondary | ICD-10-CM | POA: Diagnosis not present

## 2021-12-29 DIAGNOSIS — I5022 Chronic systolic (congestive) heart failure: Secondary | ICD-10-CM

## 2021-12-29 DIAGNOSIS — I10 Essential (primary) hypertension: Secondary | ICD-10-CM | POA: Diagnosis not present

## 2021-12-29 DIAGNOSIS — E785 Hyperlipidemia, unspecified: Secondary | ICD-10-CM

## 2021-12-29 DIAGNOSIS — E034 Atrophy of thyroid (acquired): Secondary | ICD-10-CM

## 2021-12-29 DIAGNOSIS — Z941 Heart transplant status: Secondary | ICD-10-CM

## 2021-12-29 DIAGNOSIS — K219 Gastro-esophageal reflux disease without esophagitis: Secondary | ICD-10-CM | POA: Diagnosis not present

## 2021-12-29 DIAGNOSIS — Z0000 Encounter for general adult medical examination without abnormal findings: Secondary | ICD-10-CM | POA: Diagnosis not present

## 2021-12-29 DIAGNOSIS — E119 Type 2 diabetes mellitus without complications: Secondary | ICD-10-CM | POA: Diagnosis not present

## 2021-12-29 NOTE — Patient Instructions (Addendum)
Team please change shingrix from 2008 listed to zostavax- shingrix did not exist at that time.  ? ?Ask [facility] or phrmacy about shingrix or Tdap ? ?Ask [facility] to do PSA with next bloodwork ? ?Schedule derm visit- we can refer you if needed ? ?If trouble swallowing returns or worsens let us know- could refer to [facility] ? ?No labs today ? ?Recommended follow up: Return in about 6 months (around 06/30/2022) for followup or sooner if needed.Schedule b4 you leave. ?

## 2021-12-30 NOTE — Unmapped (Signed)
Reviewed with Dr. Cherly Hensen    [facility]    LHC:   CONCLUSIONS:  - No significant coronary artery disease        CXR:     Lungs are clear. No pleural effusion or pneumothorax.   Stable cardiomediastinal silhouette.         DSA: 10/22/20 NO DSA, due for repeat      Recent Labs:   Admission on 12/14/2021, Discharged on 12/14/2021   Component Date Value Ref Range Status   ??? WBC 12/14/2021 10.1  3.6 - 11.2 10*9/L Final   ??? RBC 12/14/2021 4.60  4.26 - 5.60 10*12/L Final   ??? HGB 12/14/2021 13.4  12.9 - 16.5 g/dL Final   ??? HCT 16/06/9603 40.0  39.0 - 48.0 % Final   ??? MCV 12/14/2021 86.9  77.6 - 95.7 fL Final   ??? MCH 12/14/2021 29.2  25.9 - 32.4 pg Final   ??? MCHC 12/14/2021 33.6  32.0 - 36.0 g/dL Final   ??? RDW 54/05/8118 13.9  12.2 - 15.2 % Final   ??? MPV 12/14/2021 8.0  6.8 - 10.7 fL Final   ??? Platelet 12/14/2021 297  150 - 450 10*9/L Final   ??? Sodium 12/14/2021 138  135 - 145 mmol/L Final   ??? Potassium 12/14/2021 4.2  3.4 - 4.8 mmol/L Final   ??? Chloride 12/14/2021 107  98 - 107 mmol/L Final   ??? CO2 12/14/2021 22.0  20.0 - 31.0 mmol/L Final   ??? Anion Gap 12/14/2021 9  5 - 14 mmol/L Final   ??? BUN 12/14/2021 26 (H)  9 - 23 mg/dL Final   ??? Creatinine 12/14/2021 1.39 (H)  0.60 - 1.10 mg/dL Final   ??? BUN/Creatinine Ratio 12/14/2021 19   Final   ??? eGFR CKD-EPI (2021) Male 12/14/2021 57 (L)  >=60 mL/min/1.61m2 Final   ??? Glucose 12/14/2021 165  70 - 179 mg/dL Final   ??? Calcium 14/78/2956 9.7  8.7 - 10.4 mg/dL Final   ??? EKG Ventricular Rate 12/14/2021 79  BPM Final   ??? EKG Atrial Rate 12/14/2021 79  BPM Final   ??? EKG P-R Interval 12/14/2021 144  ms Final   ??? EKG QRS Duration 12/14/2021 88  ms Final   ??? EKG Q-T Interval 12/14/2021 394  ms Final   ??? EKG QTC Calculation 12/14/2021 451  ms Final   ??? EKG Calculated P Axis 12/14/2021 24  degrees Final   ??? EKG Calculated R Axis 12/14/2021 2  degrees Final   ??? EKG Calculated T Axis 12/14/2021 38  degrees Final   ??? QTC Fredericia 12/14/2021 432  ms Final   Documentation on 12/10/2021   Component Date Value Ref Range Status   ??? Vitamin D Total (25OH) 12/10/2021 52  30 - 100 ng/mL Final   Documentation on 12/10/2021   Component Date Value Ref Range Status   ??? Tacrolimus, Timed 12/10/2021 4.7  mcg/L Final   Documentation on 12/10/2021   Component Date Value Ref Range Status   ??? WBC 12/10/2021 9.0  3.8 - 10.8 Thousand/uL Final   ??? RBC 12/10/2021 4.32  4.20 - 5.80 Million/uL Final   ??? HGB 12/10/2021 12.8 (L)  13.2 - 17.1 g/dL Final   ??? HCT 21/30/8657 39.1  38.5 - 50.0 % Final   ??? MCV 12/10/2021 90.5  80.0 - 100.0 fL Final   ??? MCH 12/10/2021 29.6  27.0 - 33.0 pg Final   ??? MCHC 12/10/2021 32.7  32.0 - 36.0 g/dL  Final   ??? RDW 12/10/2021 12.7  11.0 - 15.0 % Final   ??? Platelet 12/10/2021 271  140 - 400 Thousand/uL Final   ??? MPV 12/10/2021 9.8  7.5 - 12.5 fL Final   ??? Absolute Neutrophils 12/10/2021 6,444  1,500 - 7,800 cells/uL Final   ??? Absolute Lymphocytes 12/10/2021 1,611  850 - 3,900 cells/uL Final   ??? Absolute Monocytes 12/10/2021 648  200 - 950 cells/uL Final   ??? Absolute Eosinophils 12/10/2021 207  15 - 500 cells/uL Final   ??? Absolute Basophils 12/10/2021 90  0 - 200 cells/uL Final   ??? Neutrophils % 12/10/2021 71.6  % Final   ??? Lymphocytes % 12/10/2021 17.9  % Final   ??? Monocytes % 12/10/2021 7.2  % Final   ??? Eosinophils % 12/10/2021 2.3  % Final   ??? Basophils % 12/10/2021 1.0  % Final   Documentation on 12/10/2021   Component Date Value Ref Range Status   ??? TSH 12/10/2021 1.78  0.40 - 4.50 mIU/L Final   Documentation on 12/10/2021   Component Date Value Ref Range Status   ??? Magnesium 12/10/2021 1.7  1.5 - 2.5 mg/dL Final   Documentation on 12/10/2021   Component Date Value Ref Range Status   ??? Hemoglobin A1C 12/10/2021 6.4 (H)  <5.7 % Final   Documentation on 12/10/2021   Component Date Value Ref Range Status   ??? Glucose 12/10/2021 142 (H)  65 - 99 mg/dL Final   ??? BUN 16/06/9603 23  7 - 25 mg/dL Final   ??? Creatinine 12/10/2021 1.31  0.70 - 1.35 mg/dL Final   ??? EGFR CKD-EPI Non-African American,* 12/10/2021 61 >=60 mL/min Final   ??? BUN/Creatinine Ratio 12/10/2021 NOT APPLICABLE  6 - 22 (calc) Final   ??? Sodium 12/10/2021 135  135 - 146 mmol/L Final   ??? Potassium 12/10/2021 4.5  3.5 - 5.3 mmol/L Final   ??? Chloride 12/10/2021 104  98 - 110 mmol/L Final   ??? CO2 12/10/2021 21  20 - 32 mmol/L Final   ??? Calcium 12/10/2021 9.3  8.6 - 10.3 mg/dL Final   ??? Total Protein 12/10/2021 6.7  6.1 - 8.1 g/dL Final   ??? Albumin 54/05/8118 4.4  3.6 - 5.1 g/dL Final   ??? Globulin, Total 12/10/2021 2.3  1.9 - 3.7 g/dL (calc) Final   ??? Albumin/Globulin Ratio 12/10/2021 1.9  1.0 - 2.5 (calc) Final   ??? Total Bilirubin 12/10/2021 0.5  0.2 - 1.2 mg/dL Final   ??? Alkaline Phosphatase 12/10/2021 79  35 - 144 U/L Final   ??? AST 12/10/2021 11  10 - 35 U/L Final   ??? ALT 12/10/2021 18  9 - 46 U/L Final   Documentation on 12/10/2021   Component Date Value Ref Range Status   ??? Cholesterol 12/10/2021 118  <200 mg/dL Final   ??? HDL 14/78/2956 33 (L)  >=40 mg/dL Final   ??? Triglycerides 12/10/2021 135  <150 mg/dL Final   ??? LDL Calculated 12/10/2021 63  <100 mg/dL (calc) Final   ??? Chol/HDL Ratio 12/10/2021 3.6  <2.1 (calc) Final   ??? 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 Cannot rule out small incarcerated bowel. We discussed that definitive therapy would be future surgery if needed.  - Plan for abdominal CT with po contrast to evaluate transverse abdominal hernia      I attempted to order CT scan locally but could not get insurance approval, will continue to follow up

## 2022-01-11 NOTE — Unmapped (Signed)
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      All above HIPAA information was verified with patient.     Was a Nurse, learning disability used for this call? No    Completed refill call assessment today to schedule patient's medication shipment from the [facility] [phone]).  All relevant notes have been reviewed.     Specialty medication(s) and dose(s) confirmed: Regimen is correct and unchanged.   Changes to medications: [name] reports no changes at this time.  Changes to insurance: No  New side effects reported not previously addressed with a pharmacist or physician: None reported  Questions for the pharmacist: No    Confirmed patient received a Conservation officer, historic buildings and a Surveyor, mining with first shipment. The patient will receive a drug information handout for each medication shipped and additional FDA Medication Guides as required.       DISEASE/MEDICATION-SPECIFIC INFORMATION        N/A    SPECIALTY MEDICATION ADHERENCE     Medication Adherence    Patient reported X missed doses in the last month: 0  Specialty Medication: Tacrolimus 0.5mg   Patient is on additional specialty medications: No              Were doses missed due to medication being on hold? No    Tacrolimus 0.5 mg: 7 days of medicine on hand     REFERRAL TO PHARMACIST     Referral to the pharmacist: Not needed      [facility]     Shipping address confirmed in Epic.     Delivery Scheduled: Yes, Expected medication delivery date: 01/14/22.     Medication will be delivered via UPS to the prescription address in Epic WAM.    [name]   [facility] Specialty Pharmacist

## 2022-01-13 MED FILL — LEVOTHYROXINE 75 MCG TABLET: ORAL | 30 days supply | Qty: 30 | Fill #6

## 2022-01-13 MED FILL — TACROLIMUS 0.5 MG CAPSULE, IMMEDIATE-RELEASE: ORAL | 30 days supply | Qty: 90 | Fill #10

## 2022-01-13 MED FILL — CHLORTHALIDONE 25 MG TABLET: ORAL | 30 days supply | Qty: 30 | Fill #6

## 2022-01-13 MED FILL — DILTIAZEM CD 180 MG CAPSULE,EXTENDED RELEASE 24 HR: ORAL | 30 days supply | Qty: 60 | Fill #6

## 2022-02-04 ENCOUNTER — Telehealth: Payer: Self-pay | Admitting: Pharmacist

## 2022-02-04 NOTE — Progress Notes (Unsigned)
Chronic Care Management Pharmacy Assistant   Name: [name]  MRN: [id number] DOB: 1956-01-14   Reason for Encounter: Diabetes Adherence Call   Recent office visits:  12/29/2021 OV (PCP) Marin Olp, MD; no medication changes indicated.  Recent consult visits:  None  Hospital visits:  None in previous 6 months  Medications: Outpatient Encounter Medications as of 02/04/2022  Medication Sig   amLODipine (NORVASC) 10 MG tablet Take 10 mg by mouth daily. Per [facility]   aspirin EC 81 MG tablet Take 1 tablet (81 mg total) by mouth daily.   atorvastatin (LIPITOR) 80 MG tablet Take 1 tablet (80 mg total) by mouth daily at 6 PM.   chlorthalidone (HYGROTON) 25 MG tablet Take 1 tablet (25 mg total) by mouth daily.   cholecalciferol (VITAMIN D) 1000 units tablet Take 3 tablets (3,000 Units total) by mouth daily.   diltiazem (TIAZAC) 180 MG 24 hr capsule Take 2 capsules (360 mg total) by mouth daily.   ezetimibe (ZETIA) 10 MG tablet Take 1 tablet by mouth once daily   famotidine (PEPCID) 20 MG tablet Take 20 mg by mouth in the morning and at bedtime.   levothyroxine (SYNTHROID, LEVOTHROID) 75 MCG tablet Take 1 tablet (75 mcg total) by mouth daily before breakfast.   magnesium oxide (MAG-OX) 400 MG tablet Take 1.5 tablets (600 mg total) by mouth 2 (two) times daily.   metFORMIN (GLUCOPHAGE) 1000 MG tablet TAKE 1 TABLET BY MOUTH TWICE DAILY WITH MEALS   mycophenolate (CELLCEPT) 500 MG tablet Take 1 tablet (500 mg total) by mouth 2 (two) times daily.   tacrolimus (PROGRAF) 0.5 MG capsule Take 2 capsules (1 mg total) by mouth 2 (two) times daily. (Patient taking differently: Take by mouth 2 (two) times daily. 2 tablets in the AM 1 tablet in the PM)   tamsulosin (FLOMAX) 0.4 MG CAPS capsule Take 0.4 mg by mouth.   valsartan (DIOVAN) 160 MG tablet Take 320 mg by mouth daily.    No facility-administered encounter medications on file as of 02/04/2022.   Recent Relevant Labs: Lab Results   Component Value Date/Time   HGBA1C 6.0 04/24/2021 12:00 AM   HGBA1C 5.9 10/22/2020 12:00 AM    Kidney Function Lab Results  Component Value Date/Time   CREATININE 1.4 (A) 12/14/2021 12:00 AM   CREATININE 1.6 (A) 04/24/2021 12:00 AM   CREATININE 1.35 12/17/2020 11:33 AM   CREATININE 1.15 01/23/2016 03:27 PM   GFR 55.68 (L) 12/17/2020 11:33 AM   GFRNONAA 48 10/22/2020 12:00 AM   GFRAA 56 10/22/2020 12:00 AM    Current antihyperglycemic regimen:  Metformin 1000 mg twice daily  What recent interventions/DTPs have been made to improve glycemic control:  No recent interventions or DTPs.  Have there been any recent hospitalizations or ED visits since last visit with CPP? No  Patient denies hypoglycemic symptoms.  Patient denies hyperglycemic symptoms.  How often are you checking your blood sugar? once daily  What are your blood sugars ranging?  Fasting: 150-157  During the week, how often does your blood glucose drop below 70? Never  Are you checking your feet daily/regularly? Yes  Adherence Review: Is the patient currently on a STATIN medication? Yes Is the patient currently on ACE/ARB medication? Yes Does the patient have >5 day gap between last estimated fill dates? No  Care Gaps: Medicare Annual Wellness: Completed 06/05/2021 Ophthalmology Exam: Overdue since 06/20/2021 Foot Exam: Completed on 06/18/2022 Hemoglobin A1C: 6.0% on 04/24/2021 Colonoscopy: Completed on 04/25/2017  Future Appointments  Date Time Provider [place]  05/24/2022  3:00 PM [facility] CCM PHARMACIST [facility]  06/30/2022 10:00 AM Marin Olp, MD [facility]    Star Rating Drugs: Atorvastatin 80 mg last filled 12/15/2021 90 DS Valsartan 160 mg last filled 12/02/2021 90 DS Metformin 1000 mg last filled 10/27/2021 90 DS  April D Calhoun, Day Pharmacist Assistant [phone]

## 2022-02-08 ENCOUNTER — Other Ambulatory Visit: Payer: Self-pay | Admitting: Family Medicine

## 2022-02-08 DIAGNOSIS — R7309 Other abnormal glucose: Secondary | ICD-10-CM

## 2022-02-17 NOTE — Unmapped (Signed)
[facility] Refill Coordination Note    Specialty Medication(s) to be Shipped:   Transplant: mycophenolate mofetil 500mg  and tacrolimus 0.5mg     Other medication(s) to be shipped:  valsartan, amlodipine, asa, famotidine, chlorthalidone, diltiazem and levothyroxine     Andrew Dickerson, DOB: [birth year]  Phone: There are no phone numbers on file.      All above HIPAA information was verified with patient.     Was a Nurse, learning disability used for this call? No    Completed refill call assessment today to schedule patient's medication shipment from the [facility] [phone]).  All relevant notes have been reviewed.     Specialty medication(s) and dose(s) confirmed: Regimen is correct and unchanged.   Changes to medications: Andrew Dickerson reports no changes at this time.  Changes to insurance: No  New side effects reported not previously addressed with a pharmacist or physician: None reported  Questions for the pharmacist: No    Confirmed patient received a Conservation officer, historic buildings and a Surveyor, mining with first shipment. The patient will receive a drug information handout for each medication shipped and additional FDA Medication Guides as required.       DISEASE/MEDICATION-SPECIFIC INFORMATION        N/A    SPECIALTY MEDICATION ADHERENCE     Medication Adherence    Patient reported X missed doses in the last month: 0  Specialty Medication: tacrolimus 0.5 MG  Patient is on additional specialty medications: Yes  Additional Specialty Medications: mycophenolate 500 mg   Patient Reported Additional Medication X Missed Doses in the Last Month: 0         Were doses missed due to medication being on hold? No  mycophenolate mofetil 557m 7 days worth of medication on hand.  Tacrolimus 0.5 mg: 7 days of medicine on hand     REFERRAL TO PHARMACIST     Referral to the pharmacist: Not needed      [facility]     Shipping address confirmed in Epic.     Delivery Scheduled: Yes, Expected medication delivery date: 02/19/22.     Medication will be delivered via UPS to the prescription address in Epic WAM.    Andrew Dickerson   [facility] Specialty Technician

## 2022-02-18 MED FILL — MYCOPHENOLATE MOFETIL 500 MG TABLET: ORAL | 90 days supply | Qty: 180 | Fill #2

## 2022-02-18 MED FILL — VALSARTAN 160 MG TABLET: ORAL | 90 days supply | Qty: 180 | Fill #1

## 2022-02-18 MED FILL — AMLODIPINE 10 MG TABLET: ORAL | 90 days supply | Qty: 90 | Fill #2

## 2022-02-18 MED FILL — FAMOTIDINE 20 MG TABLET: ORAL | 90 days supply | Qty: 180 | Fill #2

## 2022-02-18 MED FILL — ASPIRIN 81 MG TABLET,DELAYED RELEASE: ORAL | 90 days supply | Qty: 90 | Fill #2

## 2022-02-18 MED FILL — TACROLIMUS 0.5 MG CAPSULE, IMMEDIATE-RELEASE: ORAL | 30 days supply | Qty: 90 | Fill #11

## 2022-02-18 MED FILL — LEVOTHYROXINE 75 MCG TABLET: ORAL | 30 days supply | Qty: 30 | Fill #7

## 2022-02-18 MED FILL — DILTIAZEM CD 180 MG CAPSULE,EXTENDED RELEASE 24 HR: ORAL | 30 days supply | Qty: 60 | Fill #7

## 2022-02-18 MED FILL — CHLORTHALIDONE 25 MG TABLET: ORAL | 30 days supply | Qty: 30 | Fill #7

## 2022-03-15 DIAGNOSIS — Z941 Heart transplant status: Principal | ICD-10-CM

## 2022-03-15 MED ORDER — TACROLIMUS 0.5 MG CAPSULE, IMMEDIATE-RELEASE
ORAL_CAPSULE | ORAL | 11 refills | 30 days | Status: CP
Start: 2022-03-15 — End: ?
  Filled 2022-03-17: qty 90, 30d supply, fill #0

## 2022-03-15 NOTE — Unmapped (Signed)
[facility] Clinical Assessment & Refill Coordination Note    Andrew Dickerson, DOB: 1956/03/01  Phone: There are no phone numbers on file.    All above HIPAA information was verified with patient.     Was a Nurse, learning disability used for this call? No    Specialty Medication(s):   Transplant: mycophenolate mofetil 500mg  and tacrolimus 0.5mg      Current Outpatient Medications   Medication Sig Dispense Refill    amLODIPine (NORVASC) 10 MG tablet Take 1 tablet (10 mg total) by mouth daily. 90 tablet 3    aspirin (ADULT LOW DOSE ASPIRIN) 81 MG tablet Take 1 tablet (81 mg total) by mouth daily. 90 tablet 3    atorvastatin (LIPITOR) 80 MG tablet Take 1 tablet (80 mg total) by mouth daily. 90 tablet 3    chlorthalidone (HYGROTON) 25 MG tablet Take 1 tablet (25 mg total) by mouth daily. 30 tablet 11    cholecalciferol, vitamin D3, 1,000 unit capsule Take 3 capsules (75 mcg total) by mouth daily.      dilTIAZem (CARDIZEM CD) 180 MG 24 hr capsule Take 2 capsules (360 mg total) by mouth daily. 60 capsule 11    ezetimibe (ZETIA) 10 mg tablet Take 1 tablet (10 mg total) by mouth daily.      famotidine (PEPCID) 20 MG tablet Take 1 tablet (20 mg total) by mouth Two (2) times a day. 180 tablet 3    levothyroxine (SYNTHROID) 75 MCG tablet Take 1 tablet (75 mcg total) by mouth daily. 30 tablet 11    metFORMIN (GLUCOPHAGE) 1000 MG tablet Take 1 tablet (1,000 mg total) by mouth Two (2) times a day.      mycophenolate (CELLCEPT) 500 mg tablet Take 1 tablet (500 mg total) by mouth Two (2) times a day. 180 tablet 3    tacrolimus (PROGRAF) 0.5 MG capsule Take 2 capsules (1 mg total) by mouth daily AND 1 capsule (0.5 mg total) nightly. 90 capsule 11    tamsulosin (FLOMAX) 0.4 mg capsule TAKE 1 CAPSULE EVERY DAY 90 capsule 3    valsartan (DIOVAN) 160 MG tablet Take 2 tablets (320 mg total) by mouth daily. 180 tablet 3     No current facility-administered medications for this visit.        Changes to medications: Kamarri reports no changes at this time.    No Known Allergies    Changes to allergies: No    SPECIALTY MEDICATION ADHERENCE     tacrolimus 0.5 mg: 7 days of medicine on hand   mycophenolate 500 mg:  65 days of medicine on hand        Medication Adherence    Patient reported X missed doses in the last month: 0  Specialty Medication: tacrolimus 0.5  Patient is on additional specialty medications: Yes  Additional Specialty Medications: Mycophenolate 500mg   Patient Reported Additional Medication X Missed Doses in the Last Month: 0          Specialty medication(s) dose(s) confirmed: Regimen is correct and unchanged.     Are there any concerns with adherence? No    Adherence counseling provided? Not needed    CLINICAL MANAGEMENT AND INTERVENTION      Clinical Benefit Assessment:    Do you feel the medicine is effective or helping your condition? Yes    Clinical Benefit counseling provided? Not needed    Adverse Effects Assessment:    Are you experiencing any side effects? No    Are  you experiencing difficulty administering your medicine? No    Quality of Life Assessment:    Quality of Life    Rheumatology  Oncology  Dermatology  Cystic Fibrosis          How many days over the past month did your transplant  keep you from your normal activities? For example, brushing your teeth or getting up in the morning. 0    Have you discussed this with your provider? Not needed    Acute Infection Status:    Acute infections noted within Epic:  No active infections  Patient reported infection: None    Therapy Appropriateness:    Is therapy appropriate and patient progressing towards therapeutic goals? Yes, therapy is appropriate and should be continued    DISEASE/MEDICATION-SPECIFIC INFORMATION      N/A    PATIENT SPECIFIC NEEDS     Does the patient have any physical, cognitive, or cultural barriers? No    Is the patient high risk? Yes, patient is taking a REMS drug. Medication is dispensed in compliance with REMS program    Does the patient require a Care Management Plan? No     SOCIAL DETERMINANTS OF HEALTH     At the [facility], we have learned that life circumstances - like trouble affording food, housing, utilities, or transportation can affect the health of many of our patients.   That is why we wanted to ask: are you currently experiencing any life circumstances that are negatively impacting your health and/or quality of life? Patient declined to answer    Social Determinants of Health     Financial Resource Strain: Not on file   Internet Connectivity: Not on file   Food Insecurity: Not on file   Tobacco Use: Medium Risk    Smoking Tobacco Use: Former    Smokeless Tobacco Use: Never    Passive Exposure: Not on file   Housing/Utilities: Not on file   Alcohol Use: Not on file   Transportation Needs: Not on file   Substance Use: Not on file   Health Literacy: Not on file   Physical Activity: Not on file   Interpersonal Safety: Not on file   Stress: Not on file   Intimate Partner Violence: Not on file   Depression: Not on file   Social Connections: Not on file       Would you be willing to receive help with any of the needs that you have identified today? 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     Medication will be delivered via UPS to the confirmed prescription address in [facility].    The patient will receive a drug information handout for each medication shipped and additional FDA Medication Guides as required.  Verified that patient has previously received a Conservation officer, historic buildings and a Surveyor, mining.    The patient or caregiver noted above participated in the development of this care plan and knows that they can request review of or adjustments to the care plan at any time.      All of the patient's questions and concerns have been addressed.    Andrew Dickerson   [facility] Specialty Pharmacist

## 2022-03-17 MED FILL — ATORVASTATIN 80 MG TABLET: ORAL | 90 days supply | Qty: 90 | Fill #1

## 2022-03-17 MED FILL — DILTIAZEM CD 180 MG CAPSULE,EXTENDED RELEASE 24 HR: ORAL | 30 days supply | Qty: 60 | Fill #8

## 2022-03-17 MED FILL — CHLORTHALIDONE 25 MG TABLET: ORAL | 30 days supply | Qty: 30 | Fill #8

## 2022-03-17 MED FILL — LEVOTHYROXINE 75 MCG TABLET: ORAL | 30 days supply | Qty: 30 | Fill #8

## 2022-03-30 ENCOUNTER — Encounter: Payer: Self-pay | Admitting: Physician Assistant

## 2022-03-30 ENCOUNTER — Ambulatory Visit (INDEPENDENT_AMBULATORY_CARE_PROVIDER_SITE_OTHER): Payer: Medicare HMO | Admitting: Physician Assistant

## 2022-03-30 VITALS — BP 145/83 | HR 91 | Temp 97.2°F | Ht 68.0 in | Wt 241.0 lb

## 2022-03-30 DIAGNOSIS — I5022 Chronic systolic (congestive) heart failure: Secondary | ICD-10-CM

## 2022-03-30 DIAGNOSIS — Z48298 Encounter for aftercare following other organ transplant: Secondary | ICD-10-CM | POA: Diagnosis not present

## 2022-03-30 DIAGNOSIS — Z941 Heart transplant status: Secondary | ICD-10-CM | POA: Diagnosis not present

## 2022-03-30 DIAGNOSIS — R7989 Other specified abnormal findings of blood chemistry: Secondary | ICD-10-CM | POA: Diagnosis not present

## 2022-03-30 DIAGNOSIS — E8779 Other fluid overload: Secondary | ICD-10-CM | POA: Diagnosis not present

## 2022-03-30 DIAGNOSIS — K420 Umbilical hernia with obstruction, without gangrene: Secondary | ICD-10-CM

## 2022-03-30 LAB — CBC WITH DIFFERENTIAL/PLATELET
Basophils Absolute: 0.1 10*3/uL (ref 0.0–0.1)
Basophils Relative: 0.8 % (ref 0.0–3.0)
Eosinophils Absolute: 0.1 10*3/uL (ref 0.0–0.7)
Eosinophils Relative: 1.7 % (ref 0.0–5.0)
HCT: 39.3 % (ref 39.0–52.0)
Hemoglobin: 13.1 g/dL (ref 13.0–17.0)
Lymphocytes Relative: 14.2 % (ref 12.0–46.0)
Lymphs Abs: 1.2 10*3/uL (ref 0.7–4.0)
MCHC: 33.4 g/dL (ref 30.0–36.0)
MCV: 88.2 fl (ref 78.0–100.0)
Monocytes Absolute: 0.6 10*3/uL (ref 0.1–1.0)
Monocytes Relative: 6.7 % (ref 3.0–12.0)
Neutro Abs: 6.3 10*3/uL (ref 1.4–7.7)
Neutrophils Relative %: 76.6 % (ref 43.0–77.0)
Platelets: 267 10*3/uL (ref 150.0–400.0)
RBC: 4.46 Mil/uL (ref 4.22–5.81)
RDW: 14.4 % (ref 11.5–15.5)
WBC: 8.3 10*3/uL (ref 4.0–10.5)

## 2022-03-30 LAB — BASIC METABOLIC PANEL
BUN: 20 mg/dL (ref 6–23)
CO2: 20 mEq/L (ref 19–32)
Calcium: 9.6 mg/dL (ref 8.4–10.5)
Chloride: 103 mEq/L (ref 96–112)
Creatinine, Ser: 1.37 mg/dL (ref 0.40–1.50)
GFR: 54.22 mL/min — ABNORMAL LOW (ref >60.00)
Glucose, Bld: 134 mg/dL — ABNORMAL HIGH (ref 70–99)
Potassium: 4.8 mEq/L (ref 3.5–5.1)
Sodium: 135 mEq/L (ref 135–145)

## 2022-03-30 LAB — MAGNESIUM: Magnesium: 1.5 mg/dL (ref 1.5–2.5)

## 2022-03-30 MED ORDER — TAMSULOSIN 0.4 MG CAPSULE
ORAL_CAPSULE | 3 refills | 0 days
Start: 2022-03-30 — End: ?

## 2022-03-30 MED ORDER — FUROSEMIDE 20 MG PO TABS: 20.0000 mg | ORAL_TABLET | Freq: Two times a day (BID) | ORAL | 0 refills | Status: DC

## 2022-03-30 MED ORDER — MUPIROCIN 2 % EX OINT: 1.0000 | TOPICAL_OINTMENT | Freq: Two times a day (BID) | CUTANEOUS | 0 refills | Status: DC

## 2022-03-30 NOTE — Progress Notes (Signed)
Subjective:    Patient ID: [name], male    DOB: [birth year], 66 y.o.   MRN: [id number]  Chief Complaint  Patient presents with   Belly Button Leakage    Pt c/o clear fluid leaking from belly button for several days; pt is fasting and needing blood work done today due to having heart transplant and has paperwork what is needed to be drawn today. Pt declines any pain from belly button and been leaking fluid since Sunday.    HPI Patient with hx heart transplant is in today for discharge from [facility] x 2 days. Clear, not sure about odor. First noticed it as a puddle on his shirt, continuing to drip small drops since Sunday. No pain. No redness that he knows of. No fevers. No abdominal surgery hx. No prematurity hx. No urinary issues. Hx of umbilical hernia without any complications, says it is always popping out.  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   MYOCARDIAL INFARCTION, 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   OBESITY 02/26/2009   Thyroid disease    THYROID DISEASE, HX OF 05/14/2008   VENTRICULAR TACHYCARDIA 02/26/2009    Past Surgical History:  Procedure Laterality Date   ANGIOPLASTY     with stent   CARDIAC ASSIST DEVICE REMOVAL     CARDIAC CATHETERIZATION     COLONOSCOPY     CORONARY ARTERY BYPASS GRAFT     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   LEFT VENTRICULAR ASSIST DEVICE  05/2009   unc    Family History  Problem Relation Age of  Onset   Heart disease Mother    Lung cancer Maternal Grandmother    Lung cancer Maternal Grandfather    Colon cancer Neg Hx    Esophageal cancer Neg Hx    Prostate cancer Neg Hx    Rectal cancer Neg Hx    Stomach cancer Neg Hx     Social History   Tobacco Use   Smoking status: Former    Packs/day: 1.00    Years: 20.00    Total pack years: 20.00    Types: Cigarettes    Quit date: 07/02/1999    Years since quitting: 22.7   Smokeless tobacco: Never  Vaping Use   Vaping Use: Never used  Substance Use Topics   Alcohol use: No    Comment: once yearly   Drug use: No     No Known Allergies  Review of Systems NEGATIVE UNLESS OTHERWISE INDICATED IN HPI      Objective:     BP (!) 145/83 (BP Location: Left Arm)   Pulse 91   Temp (!) 97.2 F (36.2 C) (Temporal)   Ht '5\' 8"'$  (1.727 m)   Wt 241 lb (109.3 kg)   SpO2 100%   BMI 36.64 kg/m   Wt Readings from Last 3 Encounters:  03/30/22 241 lb (109.3 kg)  12/29/21 234 lb (106.1 kg)  06/22/21 235 lb 12.8 oz (107  kg)    BP Readings from Last 3 Encounters:  03/30/22 (!) 145/83  12/29/21 126/62  06/22/21 (!) 152/72     Physical Exam Constitutional:      Appearance: Normal appearance. He is obese.  Cardiovascular:     Rate and Rhythm: Normal rate and regular rhythm.     Pulses: Normal pulses.     Heart sounds: No murmur heard. Pulmonary:     Effort: Pulmonary effort is normal. No respiratory distress.     Breath sounds: Normal breath sounds. No wheezing.  Chest:     Chest wall: No tenderness.  Abdominal:     Hernia: A hernia (previously known umbilical hernia; minimally reducible; not tender, no abscess present, small abrasion present, not actively draining at this time) is present.     Comments: See photo below  Musculoskeletal:     Right lower leg: Edema present.     Left lower leg: Edema present.  Neurological:     Mental Status: He is alert.         Assessment & Plan:   Problem List Items Addressed  This Visit       Cardiovascular and Mediastinum   Chronic systolic heart failure (HCC)   Relevant Medications   furosemide (LASIX) 20 MG tablet   Other Visit Diagnoses     Umbilical hernia with obstruction, without gangrene    -  Primary   Other fluid overload       Heart transplant status ([place])       Relevant Orders   Basic Metabolic Panel (BMET) (Completed)   Magnesium (Completed)   CBC w/Diff (Completed)   Tacrolimus level   Aftercare following organ transplant       Relevant Orders   Basic Metabolic Panel (BMET) (Completed)   Magnesium (Completed)   CBC w/Diff (Completed)   Tacrolimus level   Other specified abnormal findings of blood chemistry       Relevant Orders   Basic Metabolic Panel (BMET) (Completed)   Magnesium (Completed)   CBC w/Diff (Completed)   Tacrolimus level         Meds ordered this encounter  Medications   mupirocin ointment (BACTROBAN) 2 %    Sig: Apply 1 Application topically 2 (two) times daily.    Dispense:  22 g    Refill:  0    Order Specific Question:   Supervising Provider    Answer:   Marin Olp [[id number]]   furosemide (LASIX) 20 MG tablet    Sig: Take 1 tablet (20 mg total) by mouth 2 (two) times daily for 3 days.    Dispense:  6 tablet    Refill:  0    Order Specific Question:   Supervising Provider    Answer:   Marin Olp [[id number]]   Plan: -Minimal amount of clear drainage from site of umbilical hernia - does not appear to have signs of infection or obstruction; most likely some fluid overload overall - pt is steadily gaining weight and has some new BLE edema. No CP or SOB.  -Plan to start on Lasix as directed, hold chlorthalidone; pt to contact his cardiologist as well -Mupirocin ointment over small abrasion of umbilicus -Pt to monitor very closely, low threshold to return to care -PCP Dr. Yong Channel participated in evaluation and plan of treatment for pt today as well   This note was prepared with assistance of Dragon  voice recognition software. Occasional wrong-word or sound-a-like substitutions may have occurred due to  the inherent limitations of voice recognition software.  Time Spent: 30 minutes of total time was spent on the date of the encounter performing the following actions: chart review prior to seeing the patient, obtaining history, performing a medically necessary exam, counseling on the treatment plan, placing orders, and documenting in our EHR.      Myra Weng M Jeno Calleros, PA-C

## 2022-03-30 NOTE — Patient Instructions (Signed)
Hold chlorthalidone x 3 days; instead start on Lasix 20 mg BID x 3 days to help remove some fluid overload; contact your cardiologist about this as well.  Use mupirocin ointment three times daily.  Monitor area closely. Call if any changes. Recheck if worse or no improvement in the next week.

## 2022-03-31 LAB — CBC W/ DIFFERENTIAL
BASOPHILS ABSOLUTE COUNT: 0.1 10*3/uL
BASOPHILS RELATIVE PERCENT: 0.8 %
EOSINOPHILS ABSOLUTE COUNT: 0.1 10*3/uL
EOSINOPHILS RELATIVE PERCENT: 1.7 %
HEMATOCRIT: 39.3 %
HEMOGLOBIN: 13.1 g/dL
LYMPHOCYTES ABSOLUTE COUNT: 1.2 10*3/uL
LYMPHOCYTES RELATIVE PERCENT: 14.2 %
MEAN CORPUSCULAR HEMOGLOBIN CONC: 33.4 g/dL
MEAN CORPUSCULAR VOLUME: 88.2 fL
MONOCYTES ABSOLUTE COUNT: 0.6 10*3/uL
MONOCYTES RELATIVE PERCENT: 6.7 %
NEUTROPHILS ABSOLUTE COUNT: 6.3 10*3/uL
NEUTROPHILS RELATIVE PERCENT: 76.6 %
PLATELET COUNT: 267 10*3/uL
RED BLOOD CELL COUNT: 4.46 Mil/uL
RED CELL DISTRIBUTION WIDTH: 14.4 %
WBC ADJUSTED: 8.3 10*3/uL

## 2022-03-31 LAB — BASIC METABOLIC PANEL
BLOOD UREA NITROGEN: 20 mg/dL
CALCIUM: 9.6 mg/dL
CHLORIDE: 103 meq/L
CO2: 20 meq/L
CREATININE: 1.37 mg/dL
EGFR CKD-EPI NON-AA MALE: 54 mL/min/{1.73_m2} — ABNORMAL LOW
GLUCOSE RANDOM: 134 mg/dL — ABNORMAL HIGH (ref 70–99)
POTASSIUM: 4.8 meq/L
SODIUM: 135 meq/L

## 2022-03-31 LAB — MAGNESIUM: MAGNESIUM: 1.5 mg/dL

## 2022-03-31 LAB — EXTRA SPECIMEN

## 2022-03-31 LAB — TACROLIMUS LEVEL: Tacrolimus (FK506), Blood: 6.9 ng/mL

## 2022-03-31 NOTE — Unmapped (Signed)
Refill request received for patient.      Medication Requested: tamsulosin  Last Office Visit: 06/02/2021   Next Office Visit: Visit date not found  Last Prescriber: Suella Grove    Nurse refill requirements met? Yes  If not met, why:     Sent to: Provider for signing  If sent to provider, which provider?: P Cherly Hensen

## 2022-04-01 NOTE — Unmapped (Signed)
Received message from patient asking for call back. When I called him, he reports his hernia  sprung a leak.  He was seen locally by PCP     Per CareEverywhere   Pt c/o clear fluid leaking from belly button for several days; pt is fasting and needing blood work done today due to having heart transplant and has paperwork what is needed to be drawn today. Pt declines any pain from belly button and been leaking fluid since Sunday.  Hold chlorthalidone x 3 days; instead start on Lasix 20 mg BID x 3 days to help remove some fluid overload; contact your cardiologist about this as well.Use mupirocin ointment three times daily.Monitor area closely. Call if any changes. Recheck if worse or no improvement in the next week.     Advised patient he could stop chlorthalidone, as advised by PCP. Advised patient to check BP, notify if elevated. Patient also had labs during his visit, awaiting tac level. Patient is due to see Dr Cherly Hensen, will make request for appt. Likely will see patient in make up clinic.

## 2022-04-02 NOTE — Unmapped (Signed)
Discussed recent labs with Edgar Frisk, PharmD.  Plan is to Make No Changes  with repeat labs in 3 Months.    Sent patient MyChart message with lab results and follow up plan      Lab Results   Component Value Date    TACROLIMUS 4.7 12/10/2021     Goal: Tac: 5-8  Current Dose: Tacrolimus 1 mg/0.5 mg    Lab Results   Component Value Date    BUN 20 03/30/2022    CREATININE 1.37 03/30/2022    K 4.8 03/30/2022    GLU 134 (H) 03/30/2022    MG 1.5 03/30/2022     Lab Results   Component Value Date    WBC 8.3 03/30/2022    HGB 13.1 03/30/2022    HCT 39.3 03/30/2022    PLT 267 03/30/2022    NEUTROABS 6.3 03/30/2022    EOSABS 0.1 03/30/2022

## 2022-04-03 ENCOUNTER — Other Ambulatory Visit: Payer: Self-pay | Admitting: Family Medicine

## 2022-04-04 MED ORDER — TAMSULOSIN 0.4 MG CAPSULE
ORAL_CAPSULE | 3 refills | 0 days | Status: CP
Start: 2022-04-04 — End: ?

## 2022-04-08 NOTE — Unmapped (Signed)
Called pt for appt for August.

## 2022-04-09 MED ORDER — ACCU-CHEK AVIVA PLUS TEST STRIPS
ORAL_STRIP | 11 refills | 0 days
Start: 2022-04-09 — End: 2023-04-09

## 2022-04-09 NOTE — Unmapped (Signed)
[facility] Refill Coordination Note    Specialty Medication(s) to be Shipped:   Transplant: tacrolimus 0.5mg     Other medication(s) to be shipped: chlorthalidone 25 MG,dilTIAZem 180 MG,levothyroxine 75 MCG .     Andrew Dickerson, DOB: 1956-01-14  Phone: There are no phone numbers on file.      All above HIPAA information was verified with patient.     Was a Nurse, learning disability used for this call? No    Completed refill call assessment today to schedule patient's medication shipment from the [facility] [phone]).  All relevant notes have been reviewed.     Specialty medication(s) and dose(s) confirmed: Patient reports changes to the regimen as follows: Pt reports missing 1 dose    Changes to medications: [name] reports no changes at this time.  Changes to insurance: No  New side effects reported not previously addressed with a pharmacist or physician: None reported  Questions for the pharmacist: No    Confirmed patient received a Conservation officer, historic buildings and a Surveyor, mining with first shipment. The patient will receive a drug information handout for each medication shipped and additional FDA Medication Guides as required.       DISEASE/MEDICATION-SPECIFIC INFORMATION        N/A    SPECIALTY MEDICATION ADHERENCE     Medication Adherence    Patient reported X missed doses in the last month: 1  Specialty Medication: tacrolimus 0.5 MG  Patient is on additional specialty medications: No              Were doses missed due to medication being on hold? No    tacrolimus 0.5  mg: 10 days of medicine on hand       REFERRAL TO PHARMACIST     Referral to the pharmacist: Not needed      [facility]     Shipping address confirmed in Epic.     Delivery Scheduled: Yes, Expected medication delivery date: 04/14/22.     Medication will be delivered via UPS to the prescription address in Epic WAM.    [name]   [facility] Specialty Technician

## 2022-04-12 NOTE — Unmapped (Signed)
Refill request received for patient.      Medication Requested: Accu-check Aviva plus test strip  Last Office Visit: 06/02/2021   Next Office Visit: 04/20/2022  Last Prescriber: Suella Grove    Nurse refill requirements met? No  If not met, why: non-cardiac med    Sent to: Provider for signing  If sent to provider, which provider?: P Cherly Hensen

## 2022-04-13 MED ORDER — ACCU-CHEK AVIVA PLUS TEST STRIPS
ORAL_STRIP | 11 refills | 0 days | Status: CP
Start: 2022-04-13 — End: 2023-04-13
  Filled 2022-04-13: qty 200, 30d supply, fill #0

## 2022-04-13 MED FILL — LEVOTHYROXINE 75 MCG TABLET: ORAL | 30 days supply | Qty: 30 | Fill #9

## 2022-04-13 MED FILL — TACROLIMUS 0.5 MG CAPSULE, IMMEDIATE-RELEASE: ORAL | 30 days supply | Qty: 90 | Fill #1

## 2022-04-13 MED FILL — CHLORTHALIDONE 25 MG TABLET: ORAL | 30 days supply | Qty: 30 | Fill #9

## 2022-04-13 MED FILL — DILTIAZEM CD 180 MG CAPSULE,EXTENDED RELEASE 24 HR: ORAL | 30 days supply | Qty: 60 | Fill #9

## 2022-04-14 DIAGNOSIS — H5213 Myopia, bilateral: Secondary | ICD-10-CM | POA: Diagnosis not present

## 2022-04-15 NOTE — Unmapped (Signed)
[facility] Transplant Clinic Visit         Primary Provider: Shelva Majestic, MD   [address] [place] [address]    Other providers:  Melvia Heaps, MD, GI [facility]  Jethro Bolus, Urology    Reason for Visit:  Andrew Dickerson is a 66 y.o. male who is being seen for continued post-transplant care as his annual heart transplant clinic follow up.     Assessment & Plan:  Doing well. No medication changes.  Abdominal CT then surgical referral to repair umbilical hernia.  Health maintenance reviewed as below - cardiac testing (DSE in 6 months); updating immunizations - e.g., Shingrix, Tetanus, COVID booster, PCV20.     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  On dual immunosuppression (Mycophenolate 500 bid and Tacrolimus 1 mg /0.5 mg with goal 5-8) with normal cardiac function as again seen on today's echo.  No significant DSA's detected after 2018 (previously occasional single low level DSA (DR 16). No CAV.  - Recent Tac levels below, at goal. (Last tac dose adjustment 01/2018).  No changes today (stable Cr).    Lab Results   Component Value Date    Tacrolimus, Trough 6.9 03/30/2022    Tacrolimus, Trough 5.3 08/10/2021    Tacrolimus, Trough 7.6 11/29/2014    Tacrolimus, Trough 9.2 07/24/2014    Tacrolimus, Timed 4.7 12/10/2021   - His next diagnostic testing will be a DSE in 6 months  - Return to clinic 1 year, sooner PRN    # Hypertension.  Well controlled on his current 4 drug regimen (Amlodipine 10 qAM, Diltiazem 360 qPM, Valsartan 320 mg, Chlorthalidone 25 qAM). (Amlodipine increased from 5 to 10 in 07/2018.)     Patient was transitioned from Lisinopril to Valsartan 160 mg daily, 08/2019 due to elevated blood pressures. He was further increased to 320 mg daily 09/2019 after BP remained elevated  - Home BP range (per his home log) 125-155/60-70 over the last month; as before reports his machines reads his systolic BP about 20 mm Hg higher than others. Today 158/68 at home today, vs clinic today BP: 140/59.   - No changes     # Hyperlipidemia. Tolerating statin therapy - Atorvastatin 80 mg.   LDL 63 (12/10/21); previously 68 (04/24/21), 61 (10/22/2020), 95-109 (in 2020-21).  Lab Results   Component Value Date    CHOL 118 12/10/2021    LDL 63 12/10/2021    HDL 33 (L) 12/10/2021    TRIG 135 12/10/2021    A1C 6.4 (H) 12/10/2021   - LDL at goal, <70 with good A1c and no CAV.  No changes     # Diabetes (type II, on oral therapy)  # Obesity   On Metformin 1000 mg BID since at least 2017, managed by PCP.    - Most recent HgbA1c 6.4% 12/10/21 (previously as high as 13.5% 12/2015; well-controlled since). Checks BG once daily.    - Encouraged exercise and weight loss (to help with abd hernia); encouraged weight of 220 or less    # Umbilical hernia.  He has had a chronic abdominal umbilical hernia but has a more prominent mid abdominal ventral hernia with palpable tenderness along the left side.  We discussed in 2022 that definitive therapy would be future surgery but he did not follow-up with a CT.    - Now intermittently leaking (since 03/2022) and more taut but no clear incarcerated hernia.  Measured approx 6x5 (see photo), scab  in right lower area  - Plan (again) for abdominal CT with po contrast to evaluate transverse abdominal hernia, then refer to surgery since hernia is now occasionally leaking and pt interested in surgical repair    # Other stable medical problems:  - Stage 2-3 CKD. Cr range 1.3- 1.6 since 2021 (02/26/20 Cr peaked at 1.6, when Tac level 6), <1.3 before 2021.  We discussed at past visits adequate hydration to minimize CKD with many years of tacrolimus therapy.   Continue to monitor  - Magnesium supplementation. Most recent Mg levels WNL on Mg 250 mg BID past 2 month (was previously 400 mg BID; a higher dose caused diarrhea).  No change to lower Mg dose.  - BPH.  On Flomax, with tolerable nocturia (2-3x/night) and occasional urgency  leaking.  H/o urinary urgency which he didn't have in 2019 when we stopped Flomax (when unclear if he needed it).  Reported in 2021 that his prostate was microwaved (internally) years ago (pre-LVAD) by Dr. Patsi Sears of [facility] who retired.  Last urology visit was ~2017.  Encouraged follow-up with urology PRN.  PSA WNL as below.  - GERD. on Famotidine every day (previously BID). Previously on Omeprazole. Only noted heartburn when supine in or on right side.  Continue Famotidine (he desired no change in regimen in 2021 but this could be as needed).    - Hypothyroidism. TSH - 1.78 (12/10/21) on supplementation Synthroid 75 mcg (TSH WNL since 2012) - no change.   - Musculoskeletal knee pain and lower back pain:  Follow-up with PCP PRN     # Health maintenance     -Dental: Has dentures  -Eyes: Is screened annually.Last visit 04/2022  >Cancer Screening  -PSA: 0.70 (10/2020)  -Dermatology: Last visit 2019, recommended follow up. Was previously seeing Dr Terri Piedra, who retired   -Colonoscopy: Last 04/2017, follow up due 2028 (Dr. Arlyce Dice, [facility])  -CXR: 12/14/21 Clear lungs    >Endocrine  -TSH as above  -Vitamin D-  52 (12/10/21) 46 (04/24/2021) 38.7 (03/25/20), 44 (07/2018), on Vitamin D 3000 units daily - no change  -HgAIC  - as above.  >ID/Vaccincations:   - Flu Shot:  06/02/2021  - Pneumovax:  Prevnar13: 07/18/12.  PPSV23: 02/23/2019.  Recommend PCV20 which he can get now  - Tetanus: last 06/09/2012, due 2023  - Shingrix: Recommended he get with PCP if available  - COVID 19: 11/04/19, 12/01/19, 10/22/20 - due for booster dose #4 (either bivalent now or new fall vaccine) (had Covid 04/2020 with nausea, diarrhea, fatigue; resolved without specific therapy)       Patient was also seen by Melene Muller, RN, transplant coordinator, who also served as a Neurosurgeon.    - Joya Gaskins, MD      History of Present Illness:  Andrew Dickerson is a 65 y.o. male with underwent LVAD, then a heart transplantation for ischemic cardiomyopathy on 07/18/2012. His post-transplant course has been notable for only for labile DM control (hospitalization locally for DKA 12/2015).  His cardiac status has been normal/stable, no CAV, occasional single DSA with MFI >1000, and he remains on chronic dual immunosuppression of  Tacrolimus and Mycophenolate.  His cardiac transplant-related diagnostic testing is detailed below.  His last endomyocardial biopsy was 08/24/16 (ISHLT grade 1R/1A).     Summary of recent visits/events:  - 02/01/2017 'annual' post-transplant [facility] clinic for his clinic visit.  Overall well, no medication changes, with now impressive control of his diabetes (HgA1c 5.4), well-controlled BP on  4 antihypertensives, but he still could lose weight (lowest post-transplant weight was 218 lbs last year 02/2016, when HgA1c was 7.9). Thus, we encouraged more exercise for intentional weight loss (goal <220 lbs), and to maintain current good health maintenance.    - 01/17/2018 'annual' post-transplant [facility] clinic: doing overall well; medication changes that day: decrease Tacrolimus back to 1 mg qAM, 0.5 mg qPM.  In 11/2017, he URI, saw PCP for cough, fever, given doxycycline x 1 week and Tessalon perles with resolution of symptoms.  Well since, trying to lose weight, watching his diet (records all his food intake), goes to gym to walk 4-5 miles, 3x week MWF (goal of ~6000 steps/day on average), DM well controlled on metformin.  - 02/21/19 Video visit as 'annual' post-transplant Cardiology clinic: Overall doing well (with interim med changes by our CPP: Omeprazole changed to Famotidine 20 mg BID, and tamsulosin discontinued 07/2018).  Med changes with 02/2019 visit: Double MgOxide 400 mg softgels to 800 mg/day.  Health maintenance reviewed - e.g., See PCP soon to update immunizations (PPSV23, Td, Shringrix)Repeat labs this month (usually [facility]), including repeat HgA1c to reassess home monitor accuracy and metformin, as tries to exercise more and lose weight   - 03/25/20 annual [facility] cardiology post-transplant clinic visit [facility]): Overall doing well. Restart Flomax for urinary urgency which may also help BP and Cr.   In 09/2019, Lisinopril transitioned to Valsartan which was later increased to 320 mg/d; MgOxide increased to 800 mg BID.  -06/02/21 annual [facility] cardiology clinic visit [facility]):  No medication changes.  Health maintenance reviewed     Interval History:  Since his last visit 05/2021, doing well, no interim hospital/ED visits.  No complaints today. Occasionally feels tightness in his abdomen, more so the last week.  Had issue with umbilical hernia leaking clear fluid in 03/2022 for several days (never got abd CT), saw PCP, who held chlorthalidone x 3 days in 03/2022, advised Lasix 20 mg BID x 3 days, mupirocin ointment TID - with relative resolution.  Now has a scab over the area, no pain but recently leaking a little fluid since Sunday.    PCP.  Now off Lasix, back on chlorthalidone since then (late 03/2022).    No other symptoms.  He reports no dyspnea at rest or with exertion; no orthopnea, PND, chest pain, palpitations, dizziness/lightheadnedness, presyncope, syncope.  No peripheral or abdominal swelling.  No nausea, vomiting, diarrhea, constipation. On Flomax, has chronic occasional urinary leaks; no other issues with urination.  No reflux symptoms.  No easy bruising or bleeding issues (no dark/tarry stools, epistaxis, BRBPR).  Walking well but not otherwise exercising.  Mom [phone]) accompanies him today (but not in room during exam).      Pertinent Post-operative Testing/History:  Post-Operative course:  His post-operative course required continued antibiotic therapy and Wound vac for his pre-existing LVAD driveline infection. Antihypertensive medications were slowly introduced post-HTx.    CMV D-/R-.  Toxo D-/R-.      Cardiac Diagnostic Testing:   ?? 07/18/13: LHC - no cardiac allograft vasculopathy (CAV)  ?? 08/06/14: LHC - no CAV ?? 09/04/15: LHC - no CAV   ?? 08/23/16: LHC - no CAV    ?? 07/26/17: LHC - no CAV   ?? 07/19/18: DSE (Dobutamine Stress Echo) - normal  ?? 08/13/19: LHC - no CAV  ?? 10/22/20: DSE - Normal   ?? 12/14/21: LHC- no CAV    Echo:  ?? 07/26/12: LVEF 60-65%  ?? 08/28/12: LVEF 60-65%  ??  12/04/12: LVEF 60-65%  ?? 01/04/13: LVEF >55%  ?? 05/14/13: LVEF 60-65%  ?? 02/12/14: LVEF 60-65%  ?? 01/17/15: LVEF 65-70%  ?? 02/10/16: LVEF 60-65%  ?? 02/01/17: LVEF 65%  ?? 01/17/18: LVEF 60-65%  ?? 07/19/18: Normal LVEF on DSE  ?? 03/25/20: LVEF 65-70%  ?? 06/02/21: LVEF 60-65%  ?? 04/19/22: LVEF 60-65%    DSA:   ?? 08/01/12: No DSA (with MFI>1000)  ?? 08/18/12: No DSA  ?? 12/04/12: No DSA  ?? 04/09/13: No DSA  ?? 02/12/14: DR16, MFI 1499  ?? 01/07/15: DR16, MFI 1692  ?? 09/04/15: DR 16, MFI 3322  ?? 02/10/16: DR16, MFI 1644  ?? 02/01/17: DR16, MFI 1495  ?? 01/17/18: No DSA  ?? 08/13/19: No DSA  ?? 03/25/20: No DSA  ?? 10/22/20: No DSA      Rejection History:   ?? 08/02/2012: Mild acute cellular rejection, ISHLT 1R/2. His immunosupressive therapy was continued and Tacrolimus has been titrated with daily levels for a current goal of 10-12.   ?? 09/11/12: showed mild rejection, ISHLT 1R/2 (One small foci of infiltrate, background is clean). His Prednisone was decreased to 12.5 mg daily. Subsequent biopsies have showed no rejection and so his immunosuppression was gradually weaned as per our practice protocol.  ?? 01/05/13: Baseline Allomap 34  ?? 02/03/13: Prednisone stopped  ?? 04/09/13: Allomap off Prednisone 33       Past Medical History:  Past Medical History:   Diagnosis Date   ??? Coronary atherosclerosis of native coronary artery 04/21/2009    12/92: CABG x 5 with LIMA to LAD and saphenous grafts to diagonal 1, obtuse marginal, distal circ and distal PDA 8/00: Repeat CABG x 3 with redo free left radial to distal LAD, saphenous to RCA, obtuse margina 2005: multiple stents placed to vein grafts and native arteries    ??? DM (diabetes mellitus) (CMS-HCC) 12/27/2012   ??? Hypothyroidism 05/07/2011   ??? LVAD (left ventricular assist device) present (CMS-HCC) 12/27/2012    05/09/09 received heart mate II    ??? Mixed hyperlipidemia 05/07/2011   ??? Obesity 12/27/2012     Past Surgical History:   Procedure Laterality Date   ??? HEART TRANSPLANT  07/18/12    CMV D-/R-   ??? LEFT VENTRICULAR ASSIST DEVICE  05/09/09   ??? PR BIOPSY OF HEART LINING N/A 09/04/2015    Procedure: Left/Right Heart Catheterization W Biopsy;  Surgeon: Lesle Reek, MD;  [facility];  Service: Cardiology   ??? PR CATH PLACE/CORON ANGIO, IMG SUPER/INTERP,R&L HRT CATH, L HRT VENTRIC N/A 08/24/2016    Procedure: Left/Right Heart Catheterization W Intervention;  Surgeon: Lesle Reek, MD;  Location: [facility] CATH;  Service: Cardiology   ??? PR CATH PLACE/CORON ANGIO, IMG SUPER/INTERP,W LEFT HEART VENTRICULOGRAPHY N/A 07/26/2017    Procedure: Left Heart Catheterization;  Surgeon: Rosana Hoes, MD;  Location: [facility] CATH;  Service: Cardiology   ??? PR CATH PLACE/CORON ANGIO, IMG SUPER/INTERP,W LEFT HEART VENTRICULOGRAPHY N/A 08/13/2019    Procedure: Left Heart Catheterization;  Surgeon: Marlaine Hind, MD;  Location: [facility] CATH;  Service: Cardiology   ??? PR CATH PLACE/CORON ANGIO, IMG SUPER/INTERP,W LEFT HEART VENTRICULOGRAPHY N/A 12/14/2021    Procedure: Left Heart Catheterization;  Surgeon: Neal Dy, MD;  Location: [facility] CATH;  Service: Cardiology       Allergies: Patient has no known allergies.    Medications:   Current Outpatient Medications on File Prior to Visit   Medication Sig   ??? amLODIPine (NORVASC) 10  MG tablet Take 1 tablet (10 mg total) by mouth daily.   ??? aspirin (ADULT LOW DOSE ASPIRIN) 81 MG tablet Take 1 tablet (81 mg total) by mouth daily.   ??? atorvastatin (LIPITOR) 80 MG tablet Take 1 tablet (80 mg total) by mouth daily.   ??? blood sugar diagnostic (ACCU-CHEK AVIVA PLUS TEST STRP) Strp CHECK BLOOD SUGAR BEFORE MEALS, AT BEDTIME, & AS NEEDED FOR HYPERGLYCEMIA UP TO 6 TIMES A DAY   ??? chlorthalidone (HYGROTON) 25 MG tablet Take 1 tablet (25 mg total) by mouth daily.   ??? cholecalciferol, vitamin D3, 1,000 unit capsule Take 3 capsules (75 mcg total) by mouth daily.   ??? dilTIAZem (CARDIZEM CD) 180 MG 24 hr capsule Take 2 capsules (360 mg total) by mouth daily.   ??? ezetimibe (ZETIA) 10 mg tablet Take 1 tablet (10 mg total) by mouth daily.   ??? famotidine (PEPCID) 20 MG tablet Take 1 tablet (20 mg total) by mouth Two (2) times a day.   ??? levothyroxine (SYNTHROID) 75 MCG tablet Take 1 tablet (75 mcg total) by mouth daily.   ??? metFORMIN (GLUCOPHAGE) 1000 MG tablet Take 1 tablet (1,000 mg total) by mouth Two (2) times a day.   ??? mycophenolate (CELLCEPT) 500 mg tablet Take 1 tablet (500 mg total) by mouth Two (2) times a day.   ??? tacrolimus (PROGRAF) 0.5 MG capsule Take 2 capsules (1 mg total) by mouth daily AND 1 capsule (0.5 mg total) nightly.   ??? tamsulosin (FLOMAX) 0.4 mg capsule TAKE 1 CAPSULE EVERY DAY   ??? valsartan (DIOVAN) 160 MG tablet Take 2 tablets (320 mg total) by mouth daily.   ??? magnesium 250 mg Tab Take 250 mg by mouth two (2) times a day.     No current facility-administered medications on file prior to visit.   NOTES  - MgOxide is 400 (not 500) QD, and is OTC gel caps which he prefers to prescribed tablets     Family History: Grandfather with CAD, heart attack 'early in life', unsure of age, died from MI age 9s. He has one son, age 1, who is healthy. He has a Engineer, maintenance (IT), born 06/2014.    Social History:   He's still living at home with his mother (age >80 years). 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 He  reports that he quit smoking about 31 years ago. His smoking use included cigarettes. He has a 20.00 pack-year smoking history. He has never used smokeless tobacco. He reports that he does not drink alcohol and does not use drugs.  Previously rare social drinking for special holiday.  No tobacco, or illicit drug use.    Review of Systems:  Rest of the review of systems is negative or unremarkable except as stated above.    Physical Exam:  Vitals:    04/20/22 1231   BP: 140/59   BP Site: L Arm   BP Position: Sitting   BP Cuff Size: Medium   Pulse: 85   SpO2: 97%   Weight: (!) 105.7 kg (233 lb)   Height: 172.7 cm (5' 8)         Wt Readings from Last 12 Encounters:   04/20/22 (!) 105.7 kg (233 lb)   12/14/21 (!) 105.9 kg (233 lb 6.4 oz)   06/02/21 (!) 108 kg (238 lb)   10/22/20 (!) 107.6 kg (237 lb 3.2 oz)   03/25/20 [name])  108.4 kg (239 lb)   08/13/19 (!) 107.6 kg (237 lb 4.8 oz)   01/17/18 (!) 105.1 kg (231 lb 9.6 oz)   07/26/17 (!) 103.9 kg (229 lb)   02/01/17 (!) 105.3 kg (232 lb 3.2 oz)   08/24/16 (!) 101.6 kg (224 lb)   02/10/16 99.3 kg (218 lb 14.4 oz)   09/04/15 (!) 113.4 kg (250 lb)    Body mass index is 35.43 kg/m??.    Constitutional: Well groomed, NAD.   HENT: Normocephalic and atraumatic.  Wearing mask, oropharynx not specifically examined  Neck: Supple without enlargements, no thyromegaly, bruit or JVD. No cervical or supraclavicular lymphadenopathy.   Cardiovascular: Nondisplaced PMI, normal S1, S2, no M/G/R. Normal carotid pulses without bruits. Normal peripheral pulses.   Lungs: Chest rise symmetric.  Clear to auscultation bilaterally, without wheezes/crackles/rhonchi. Good air movement.   Skin: No rashes/breakdowns   Abdomen:  Soft, rotund, nontender, nondistended. +Large umbilical hernia noted, soft, nontender, modestly reducible, measuring approximately 6 cm x 5 cm, with scab over its RLQ - no fluid expressed.  No Hepatomegaly or other masses.   Extremities: No edema dependently bilaterally.   Musculoskeletal: No obvious joint deformity, effusions.  Psychiatry: In good spirits, normal affect.  Neurological:   Nonfocal    Labs & Imaging:  Reviewed in EPIC.   Office Visit on 04/20/2022   Component Date Value Ref Range Status   ??? EKG Ventricular Rate 04/20/2022 83  BPM Final   ??? EKG Atrial Rate 04/20/2022 83  BPM Final   ??? EKG P-R Interval 04/20/2022 136  ms Final   ??? EKG QRS Duration 04/20/2022 82  ms Final   ??? EKG Q-T Interval 04/20/2022 352  ms Final   ??? EKG QTC Calculation 04/20/2022 413  ms Final   ??? EKG Calculated P Axis 04/20/2022 18  degrees Final   ??? EKG Calculated R Axis 04/20/2022 6  degrees Final   ??? EKG Calculated T Axis 04/20/2022 4  degrees Final   ??? QTC Fredericia 04/20/2022 392  ms Final   EKG: tracing reviewed; see final report - NSR, normal intervals  Echo: images reviewed; see final report and above.

## 2022-04-19 LAB — TACROLIMUS LEVEL, TROUGH: TACROLIMUS, TROUGH: 6.9 ng/mL

## 2022-04-20 ENCOUNTER — Ambulatory Visit: Admit: 2022-04-20 | Discharge: 2022-04-21 | Payer: MEDICARE

## 2022-04-20 ENCOUNTER — Ambulatory Visit
Admit: 2022-04-20 | Discharge: 2022-04-21 | Payer: MEDICARE | Attending: Cardiovascular Disease | Primary: Cardiovascular Disease

## 2022-04-20 DIAGNOSIS — Z941 Heart transplant status: Principal | ICD-10-CM

## 2022-04-20 DIAGNOSIS — E1100 Type 2 diabetes mellitus with hyperosmolarity without nonketotic hyperglycemic-hyperosmolar coma (NKHHC): Secondary | ICD-10-CM | POA: Diagnosis not present

## 2022-04-20 DIAGNOSIS — R9431 Abnormal electrocardiogram [ECG] [EKG]: Secondary | ICD-10-CM | POA: Diagnosis not present

## 2022-04-20 DIAGNOSIS — K439 Ventral hernia without obstruction or gangrene: Secondary | ICD-10-CM | POA: Diagnosis not present

## 2022-04-20 DIAGNOSIS — E782 Mixed hyperlipidemia: Secondary | ICD-10-CM | POA: Diagnosis not present

## 2022-04-20 DIAGNOSIS — K429 Umbilical hernia without obstruction or gangrene: Secondary | ICD-10-CM | POA: Diagnosis not present

## 2022-04-20 DIAGNOSIS — N400 Enlarged prostate without lower urinary tract symptoms: Secondary | ICD-10-CM | POA: Diagnosis not present

## 2022-04-20 DIAGNOSIS — E119 Type 2 diabetes mellitus without complications: Secondary | ICD-10-CM | POA: Diagnosis not present

## 2022-04-20 DIAGNOSIS — I10 Essential (primary) hypertension: Secondary | ICD-10-CM | POA: Diagnosis not present

## 2022-04-20 NOTE — Unmapped (Addendum)
-   Your echo looks good     - We will see you in 6 months ( 09/2022) for a DSE, Pharmacy visit and CT Scan for hernia.     - We will see you in 1 year with Dr Cherly Hensen ( 03/2023)    - See Dermatology locally.    - Your are due for the tetanus ( TDAP) vaccine. Due every 10 years, last 2013    - You are due for the Shingirx vaccine ( two dose series)    - Get your Covid Bivalent booster    - Aim for your weight to be 220 lbs or less. Weight will put stress on your hernia       Melene Muller  RN, BSN, PCCN- Heart Transplant Coordinator  [facility] for [facility]  [address]  [place], [place] [address]  p [phone]

## 2022-04-21 DIAGNOSIS — K439 Ventral hernia without obstruction or gangrene: Principal | ICD-10-CM

## 2022-04-21 DIAGNOSIS — Z941 Heart transplant status: Principal | ICD-10-CM

## 2022-04-22 DIAGNOSIS — Z941 Heart transplant status: Secondary | ICD-10-CM | POA: Diagnosis not present

## 2022-04-22 NOTE — Unmapped (Signed)
Called pt about upcoming appt.

## 2022-04-22 NOTE — Unmapped (Signed)
called pt back to clarify what test was going to be scheduled.

## 2022-05-03 ENCOUNTER — Other Ambulatory Visit: Payer: Self-pay | Admitting: Family Medicine

## 2022-05-03 DIAGNOSIS — R7309 Other abnormal glucose: Secondary | ICD-10-CM

## 2022-05-05 NOTE — Unmapped (Signed)
[facility] Refill Coordination Note    Specialty Medication(s) to be Shipped:   Transplant: mycophenolate mofetil 500mg  and tacrolimus 0.5mg     Other medication(s) to be shipped: amLODIPine 10 MG,aspirin 81 MG,atorvastatin 80 MG,chlorthalidone 25 MG,dilTIAZem 180 MG,famotidine 20 MG,levothyroxine 75 MCG,valsartan 160 MG     [name], DOB: 1955/11/26  Phone: There are no phone numbers on file.      All above HIPAA information was verified with patient.     Was a Nurse, learning disability used for this call? No    Completed refill call assessment today to schedule patient's medication shipment from the [facility] [phone]).  All relevant notes have been reviewed.     Specialty medication(s) and dose(s) confirmed: Regimen is correct and unchanged.   Changes to medications: Jarel reports no changes at this time.  Changes to insurance: No  New side effects reported not previously addressed with a pharmacist or physician: None reported  Questions for the pharmacist: No    Confirmed patient received a Conservation officer, historic buildings and a Surveyor, mining with first shipment. The patient will receive a drug information handout for each medication shipped and additional FDA Medication Guides as required.       DISEASE/MEDICATION-SPECIFIC INFORMATION        N/A    SPECIALTY MEDICATION ADHERENCE     Medication Adherence    Patient reported X missed doses in the last month: 0  Specialty Medication: mycophenolate 500 mg  Patient is on additional specialty medications: Yes  Additional Specialty Medications: tacrolimus 0.5 mg  Patient Reported Additional Medication X Missed Doses in the Last Month: 0  Patient is on more than two specialty medications: No                                Were doses missed due to medication being on hold? No    mycophenolate 500 mg: 10 days of medicine on hand   tacrolimus 0.5 mg: 10 days of medicine on hand       REFERRAL TO PHARMACIST     Referral to the pharmacist: Not needed      [facility]     Shipping address confirmed in Epic.     Delivery Scheduled: Yes, Expected medication delivery date: 05/12/22.     Medication will be delivered via UPS to the prescription address in Epic WAM.    [name]   [facility] Specialty Technician

## 2022-05-11 MED FILL — AMLODIPINE 10 MG TABLET: ORAL | 90 days supply | Qty: 90 | Fill #3

## 2022-05-11 MED FILL — MYCOPHENOLATE MOFETIL 500 MG TABLET: ORAL | 90 days supply | Qty: 180 | Fill #3

## 2022-05-11 MED FILL — CHLORTHALIDONE 25 MG TABLET: ORAL | 30 days supply | Qty: 30 | Fill #10

## 2022-05-11 MED FILL — LEVOTHYROXINE 75 MCG TABLET: ORAL | 30 days supply | Qty: 30 | Fill #10

## 2022-05-11 MED FILL — ASPIRIN 81 MG TABLET,DELAYED RELEASE: ORAL | 90 days supply | Qty: 90 | Fill #3

## 2022-05-11 MED FILL — TACROLIMUS 0.5 MG CAPSULE, IMMEDIATE-RELEASE: ORAL | 30 days supply | Qty: 90 | Fill #2

## 2022-05-11 MED FILL — VALSARTAN 160 MG TABLET: ORAL | 90 days supply | Qty: 180 | Fill #2

## 2022-05-11 MED FILL — FAMOTIDINE 20 MG TABLET: ORAL | 90 days supply | Qty: 180 | Fill #3

## 2022-05-11 MED FILL — DILTIAZEM CD 180 MG CAPSULE,EXTENDED RELEASE 24 HR: ORAL | 30 days supply | Qty: 60 | Fill #10

## 2022-05-17 NOTE — Progress Notes (Signed)
Chronic Care Management Pharmacy Note  05/24/2022 Name:  [name] MRN:  [id number] DOB:  [birth year]  Summary: PharmD FU.  Patient A1c trending up for last few checks.  Patient is working on lifestyle mods to try and bring down.  Fasting sugars are 130-140 per his report with a few 120s.  Recommendationsl Recheck A1c, diabetic kidney labs at October visit Could consider [place] (CHF benefit) if additional therapy needed  FU 4 months with PharmD.  Subjective: [name] is an 66 y.o. year old male who is a primary patient of Hunter, Brayton Mars, MD.  The CCM team was consulted for assistance with disease management and care coordination needs.    Engaged with patient by telephone for follow up visit in response to provider referral for pharmacy case management and/or care coordination services.   Consent to Services:  The patient was given the following information about Chronic Care Management services today, agreed to services, and gave verbal consent: 1. CCM service includes personalized support from designated clinical staff supervised by the primary care provider, including individualized plan of care and coordination with other care providers 2. 24/7 contact phone numbers for assistance for urgent and routine care needs. 3. Service will only be billed when office clinical staff spend 20 minutes or more in a month to coordinate care. 4. Only one practitioner may furnish and bill the service in a calendar month. 5.The patient may stop CCM services at any time (effective at the end of the month) by phone call to the office staff. 6. The patient will be responsible for cost sharing (co-pay) of up to 20% of the service fee (after annual deductible is met). Patient agreed to services and consent obtained.  Patient Care Team: Marin Olp, MD as PCP - General (Family Medicine) Venida Jarvis, MD as Consulting Physician (Cardiology) Edythe Clarity, [facility]  (Pharmacist)  Recent office visits:  None   Recent consult visits:  None   Hospital visits:  None in previous 6 months   Objective:  Lab Results  Component Value Date   CREATININE 1.37 03/30/2022   BUN 20 03/30/2022   GFR 54.22 (L) 03/30/2022   GFRNONAA 48 10/22/2020   GFRAA 56 10/22/2020   NA 135 03/30/2022   K 4.8 03/30/2022   CALCIUM 9.6 03/30/2022   CO2 20 03/30/2022   GLUCOSE 134 (H) 03/30/2022    Lab Results  Component Value Date/Time   HGBA1C 6.0 04/24/2021 12:00 AM   HGBA1C 5.9 10/22/2020 12:00 AM   GFR 54.22 (L) 03/30/2022 11:40 AM   GFR 55.68 (L) 12/17/2020 11:33 AM    Last diabetic Eye exam:  Lab Results  Component Value Date/Time   HMDIABEYEEXA No Retinopathy 06/20/2020 10:07 AM    Last diabetic Foot exam: No results found for: "HMDIABFOOTEX"   Lab Results  Component Value Date   CHOL 124 04/24/2021   HDL 33 (A) 04/24/2021   LDLCALC 68 04/24/2021   LDLDIRECT 69 02/12/2014   TRIG 147 04/24/2021   CHOLHDL 5.2 CALC 03/05/2008       Latest Ref Rng & Units 12/14/2021   12:00 AM 04/24/2021   12:00 AM 12/17/2020   11:33 AM  Hepatic Function  Total Protein 6.0 - 8.3 g/dL   7.1   Albumin 3.5 - 5.0  4.6     4.3   AST 14 - 40  12     12   ALT 10 - 40 U/L 18  16  16   Alk Phosphatase 25 - 125  78     79   Total Bilirubin 0.2 - 1.2 mg/dL   0.8      This result is from an external source.        Latest Ref Rng & Units 03/30/2022   11:40 AM 12/14/2021   12:00 AM 04/24/2021   12:00 AM  CBC  WBC 4.0 - 10.5 K/uL 8.3  10.1  9.9      Hemoglobin 13.0 - 17.0 g/dL 13.1  13.4  13.1      Hematocrit 39.0 - 52.0 % 39.3  40  41      Platelets 150.0 - 400.0 K/uL 267.0  297  293         This result is from an external source.    Lab Results  Component Value Date/Time   VD25OH 46 04/24/2021 12:00 AM    Clinical ASCVD: No  The ASCVD Risk score (Arnett DK, et al., 2019) failed to calculate for the following reasons:   The valid total cholesterol range  is 130 to 320 mg/dL       06/05/2021    2:50 PM 06/05/2021    2:44 PM 12/17/2020   10:40 AM  Depression screen PHQ 2/9  Decreased Interest 0 0 0  Down, Depressed, Hopeless 0 0 0  PHQ - 2 Score 0 0 0  Altered sleeping   1  Tired, decreased energy   0  Change in appetite   0  Feeling bad or failure about yourself    0  Trouble concentrating   0  Moving slowly or fidgety/restless   0  Suicidal thoughts   0  PHQ-9 Score   1  Difficult doing work/chores   Not difficult at all     Social History   Tobacco Use  Smoking Status Former   Packs/day: 1.00   Years: 20.00   Total pack years: 20.00   Types: Cigarettes   Quit date: 07/02/1999   Years since quitting: 22.9  Smokeless Tobacco Never   BP Readings from Last 3 Encounters:  03/30/22 (!) 145/83  12/29/21 126/62  06/22/21 (!) 152/72   Pulse Readings from Last 3 Encounters:  03/30/22 91  12/29/21 82  06/22/21 100   Wt Readings from Last 3 Encounters:  03/30/22 241 lb (109.3 kg)  12/29/21 234 lb (106.1 kg)  06/22/21 235 lb 12.8 oz (107 kg)   BMI Readings from Last 3 Encounters:  03/30/22 36.64 kg/m  12/29/21 35.58 kg/m  06/22/21 35.85 kg/m    Assessment/Interventions: Review of patient past medical history, allergies, medications, health status, including review of consultants reports, laboratory and other test data, was performed as part of comprehensive evaluation and provision of chronic care management services.   SDOH:  (Social Determinants of Health) assessments and interventions performed: No, done within year Financial Resource Strain: Low Risk  (06/05/2021)   Overall Financial Resource Strain (CARDIA)    Difficulty of Paying Living Expenses: Not hard at all    SDOH Interventions    Flowsheet Row Clinical Support from 06/05/2021 in [place] from 12/20/2017 in [place] Interventions Intervention  Not Indicated --  Transportation Interventions Intervention Not Indicated --  Depression Interventions/Treatment  -- --  [NA]  Financial Strain Interventions Intervention Not Indicated --  Physical Activity Interventions Intervention Not Indicated --  Stress Interventions Intervention Not Indicated --  Social Connections  Interventions Intervention Not Indicated --      Financial Resource Strain: Low Risk  (06/05/2021)   Overall Financial Resource Strain (CARDIA)    Difficulty of Paying Living Expenses: Not hard at all    [place]: No Food Insecurity (06/05/2021)  Housing: Low Risk  (06/05/2021)  Transportation Needs: No Transportation Needs (06/05/2021)  Alcohol Screen: Low Risk  (06/05/2021)  Depression (PHQ2-9): Low Risk  (06/05/2021)  Financial Resource Strain: Low Risk  (06/05/2021)  Physical Activity: Inactive (06/05/2021)  Social Connections: Socially Isolated (06/05/2021)  Stress: No Stress Concern Present (06/05/2021)  Tobacco Use: Medium Risk (03/30/2022)    [place]  No Known Allergies  Medications Reviewed Today     Reviewed by Edythe Clarity, [facility] (Pharmacist) on 05/24/22 at 1520  Med List Status: <None>   Medication Order Taking? Sig Documenting Provider Last Dose Status Informant  amLODipine (NORVASC) 10 MG tablet [id number] Yes Take 10 mg by mouth daily. Per [facility] [provider] Taking Active   aspirin EC 81 MG tablet [id number] Yes Take 1 tablet (81 mg total) by mouth daily. Marin Olp, MD Taking Active   atorvastatin (LIPITOR) 80 MG tablet [id number] Yes Take 1 tablet (80 mg total) by mouth daily at 6 PM. Marin Olp, MD Taking Active   chlorthalidone (HYGROTON) 25 MG tablet [id number] Yes Take 1 tablet (25 mg total) by mouth daily. Marin Olp, MD Taking Active   cholecalciferol (VITAMIN D) 1000 units tablet [id number] Yes Take 3 tablets (3,000 Units total) by mouth daily. Marin Olp, MD Taking Active    diltiazem [facility]) 180 MG 24 hr capsule [id number] Yes Take 2 capsules (360 mg total) by mouth daily. Marin Olp, MD Taking Active   ezetimibe (ZETIA) 10 MG tablet [id number] Yes Take 1 tablet by mouth once daily Marin Olp, MD Taking Active   famotidine (PEPCID) 20 MG tablet [id number]  Take 20 mg by mouth in the morning and at bedtime. [provider]  Expired 06/18/21 2359   furosemide (LASIX) 20 MG tablet [id number]  Take 1 tablet (20 mg total) by mouth 2 (two) times daily for 3 days. Allwardt, Alyssa M, PA-C  Expired 04/02/22 2359   levothyroxine (SYNTHROID, LEVOTHROID) 75 MCG tablet [id number] Yes Take 1 tablet (75 mcg total) by mouth daily before breakfast. Marin Olp, MD Taking Active   magnesium oxide (MAG-OX) 400 MG tablet [id number] Yes Take 1.5 tablets (600 mg total) by mouth 2 (two) times daily. Marin Olp, MD Taking Active   metFORMIN (GLUCOPHAGE) 1000 MG tablet [id number] Yes TAKE 1 TABLET BY MOUTH TWICE DAILY WITH MEALS Marin Olp, MD Taking Active   mupirocin ointment (BACTROBAN) 2 % [id number] Yes Apply 1 Application topically 2 (two) times daily. Allwardt, Randa Evens, PA-C Taking Active   mycophenolate (CELLCEPT) 500 MG tablet [id number] Yes Take 1 tablet (500 mg total) by mouth 2 (two) times daily. Marin Olp, MD Taking Active   tacrolimus (PROGRAF) 0.5 MG capsule [id number] Yes Take 2 capsules (1 mg total) by mouth 2 (two) times daily.  Patient taking differently: Take by mouth 2 (two) times daily. 2 tablets in the AM 1 tablet in the PM   Marin Olp, MD Taking Active   tamsulosin [facility]) 0.4 MG CAPS capsule [id number] Yes Take 0.4 mg by mouth. [provider] Taking Active   valsartan (DIOVAN) 160 MG tablet [id number] Yes Take 320 mg by mouth daily.  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/2009   Diabetes mellitus type II, controlled ([place]) 12/13/2008   Hypothyroidism 05/14/2008   Hypertension associated with diabetes ([place]) 05/14/2008   ANEMIA, OTHER UNSPEC 04/03/2008   Hyperlipidemia associated with type 2 diabetes mellitus ([place]) 04/25/2007   Coronary atherosclerosis 04/25/2007    Immunization History  Administered Date(s) Administered   HIB (PRP-OMP) 05/02/2009   Hepatitis A, Adult 06/09/2012   Hepatitis B 09/14/2011   Hepatitis B, adult 05/08/2009, 09/14/2011, 06/09/2012   Influenza Split 07/02/2011   Influenza Whole 06/25/2008   Influenza, Seasonal, Injecte, Preservative Fre 08/06/2010, 06/09/2012   Influenza,inj,Quad PF,6+ Mos 08/07/2013, 06/19/2014, 08/29/2015, 08/20/2016, 06/07/2018, 05/22/2019, 06/16/2020   Influenza-Unspecified 06/19/2014, 06/16/2020   PFIZER(Purple Top)SARS-COV-2 Vaccination 11/04/2019, 12/01/2019, 10/22/2020   PPD Test 05/04/2009, 09/14/2011, 06/09/2012   Pneumococcal Conjugate-13 07/18/2012   Pneumococcal Polysaccharide-23 09/06/2009, 06/09/2012, 02/23/2019   Pneumococcal-Unspecified 05/02/2009   Td 05/02/2009   Tdap 09/14/2011   Zoster, Live 12/16/2006    Conditions to be addressed/monitored:  HTN, DM, HLD, Hypothyroidism  Care Plan : General Pharmacy (Adult)  Updates made by Edythe Clarity, RPH since 05/24/2022 12:00 AM     Problem: HTN, DM, HLD, Hypothyroidism   Priority: High  Onset Date: 04/28/2021     Long-Range Goal: Patient-Specific Goal   Start Date: 04/28/2021  Expected End Date: 10/29/2021  Recent Progress: On track  Priority: High  Note:    Current Barriers:  FBG trending up  Pharmacist Clinical Goal(s):  Patient will maintain control of glucose and BP as evidenced by labs/monitoring  through collaboration with PharmD and provider.   Interventions: 1:1 collaboration with Marin Olp, MD regarding development and update of comprehensive plan of care as evidenced by provider attestation and co-signature Inter-disciplinary care team collaboration (see longitudinal plan of care) Comprehensive medication review performed; medication list updated in electronic medical record  Hypertension (BP goal <130/80) -Controlled, not assessed -Current treatment: Amlodipine 10mg  daily Appropriate, Query effective, Safe, Accessible Chlorthalidone 25mg  daily  Appropriate, Query effective, Safe, Accessible Diltiazem 180mg  CD daily  Appropriate, Query effective, Safe, Accessible Valsartan 160mg  daily  Appropriate, Query effective, Safe, Accessible -Medications previously tried: none noted  -Current home readings: 143/69, 118/54, 149/67, 127/63, 132/64, 136/62 -Current dietary habits: unchanged see previous  -Denies hypotensive/hypertensive symptoms -Educated on BP goals and benefits of medications for prevention of heart attack, stroke and kidney damage; Exercise goal of 150 minutes per week; Importance of home blood pressure monitoring; Symptoms of hypotension and importance of maintaining adequate hydration; -Counseled to monitor BP at home weekly, document, and provide log at future appointments -Recommended to continue current medication  Update 11/10/21 141/61, 132/66 recent BP readings Denies dizziness or HA. BP well controlled for now no changes needed. Continue current medications  Hyperlipidemia: (LDL goal < 70) 05/24/22 -Controlled, based on LDL at [facility] -Current treatment: Atorvastatin 80mg  daily Appropriate, Effective, Safe, Accessible  Zetia 10mg  daily Appropriate, Effective, Safe, Accessible -Medications  previously tried: none noted  -Reports he just had labs drawn at [facility],  last LDL was excellent. -Educated on Cholesterol goals;  Benefits of statin for ASCVD risk reduction; Importance of limiting foods high in cholesterol; -Denies any adverse effects -Most recent LDL at [facility] was 63, continues to tolerate medication.  No changes needed at this time.  Diabetes (A1c goal <6.5%) 05/24/22 -Controlled, has been trending up -Current medications: Metformin 1000mg  BID with meals Appropriate, Query effective, -Medications previously tried: insulin glargine  -Current home glucose readings fasting glucose: 135-141 -Denies hypoglycemic/hyperglycemic symptoms -Educated on A1c and blood sugar goals; Prevention and management of hypoglycemic episodes; Carbohydrate counting and/or plate method -Counseled to check feet daily and get yearly eye exams - Continues to work on lifestyle mods.  Aware that A1c has been creeping up.   He has upcoming visit with Dr. Yong Channel - recommend recheck A1c.  Patient with CHF would benefit from [place] if additional therapy is needed. No changes until October visit. Is due for diabetic kidney labs, A1c, and is bringing copy of eye exam.  Update 11/10/21 FBG - 130-150s He is eating two nutty buddy crackers per day.  States blood sugar is "creeping up."  We discussed trying to limit that to one cracker per day to see if that helps glucose. Has upcoming appt in April with PCP - recheck A1c and adjust treatment at that time. NO changes except lifestyle mods at this time.  Patient Goals/Self-Care Activities Patient will:  - take medications as prescribed check glucose daily, document, and provide at future appointments check blood pressure weekly, document, and provide at future appointments  Follow Up Plan: The care management team will reach out to the patient again over the next 180 days.              Medication Assistance: None required.  Patient affirms  current coverage meets needs.  Compliance/Adherence/Medication fill history: Care Gaps: Needs A1c checked Diabetic kidney labs  Star Meds: Metformin 1000mg  05/04/22 90ds Atorvastatin 80mg  03/16/22 90ds Valsartan 160mg  05/08/22 90ds  Patient's preferred pharmacy is:  [facility] ([facility] [place] [address] [place] [address] Phone: [phone] Fax: [phone]   Uses pill box? Yes Pt endorses 100% compliance  We discussed: Benefits of medication synchronization, packaging and delivery as well as enhanced pharmacist oversight with [employer]. Patient decided to: Continue current medication management strategy  Care Plan and Follow Up Patient Decision:  Patient agrees to Care Plan and Follow-up.  Plan: The care management team will reach out to the patient again over the next 180 days.  Beverly Milch, PharmD Clinical Pharmacist [phone]

## 2022-05-24 ENCOUNTER — Ambulatory Visit: Payer: Medicare HMO | Admitting: Pharmacist

## 2022-05-24 DIAGNOSIS — E119 Type 2 diabetes mellitus without complications: Secondary | ICD-10-CM

## 2022-05-24 DIAGNOSIS — E1169 Type 2 diabetes mellitus with other specified complication: Secondary | ICD-10-CM

## 2022-05-24 NOTE — Patient Instructions (Addendum)
Visit Information   Goals Addressed             This Visit's Progress    Monitor and Manage My Blood Sugar-Diabetes Type 2   On track    Timeframe:  Long-Range Goal Priority:  High Start Date:  04/28/21                           Expected End Date:  10/29/21                     Follow Up Date 08/05/21    - check blood sugar at prescribed times - check blood sugar before and after exercise - check blood sugar if I feel it is too high or too low - take the blood sugar log to all doctor visits    Why is this important?   Checking your blood sugar at home helps to keep it from getting very high or very low.  Writing the results in a diary or log helps the doctor know how to care for you.  Your blood sugar log should have the time, date and the results.  Also, write down the amount of insulin or other medicine that you take.  Other information, like what you ate, exercise done and how you were feeling, will also be helpful.     Notes:        Patient Care Plan: General Pharmacy (Adult)     Problem Identified: HTN, DM, HLD, Hypothyroidism   Priority: High  Onset Date: 04/28/2021     Long-Range Goal: Patient-Specific Goal   Start Date: 04/28/2021  Expected End Date: 10/29/2021  Recent Progress: On track  Priority: High  Note:   Current Barriers:  FBG trending up  Pharmacist Clinical Goal(s):  Patient will maintain control of glucose and BP as evidenced by labs/monitoring  through collaboration with PharmD and provider.   Interventions: 1:1 collaboration with Marin Olp, MD regarding development and update of comprehensive plan of care as evidenced by provider attestation and co-signature Inter-disciplinary care team collaboration (see longitudinal plan of care) Comprehensive medication review performed; medication list updated in electronic medical record  Hypertension (BP goal <130/80) -Controlled, not assessed -Current treatment: Amlodipine '10mg'$  daily  Appropriate, Query effective, Safe, Accessible Chlorthalidone '25mg'$  daily  Appropriate, Query effective, Safe, Accessible Diltiazem '180mg'$  CD daily  Appropriate, Query effective, Safe, Accessible Valsartan '160mg'$  daily  Appropriate, Query effective, Safe, Accessible -Medications previously tried: none noted  -Current home readings: 143/69, 118/54, 149/67, 127/63, 132/64, 136/62 -Current dietary habits: unchanged see previous  -Denies hypotensive/hypertensive symptoms -Educated on BP goals and benefits of medications for prevention of heart attack, stroke and kidney damage; Exercise goal of 150 minutes per week; Importance of home blood pressure monitoring; Symptoms of hypotension and importance of maintaining adequate hydration; -Counseled to monitor BP at home weekly, document, and provide log at future appointments -Recommended to continue current medication  Update 11/10/21 141/61, 132/66 recent BP readings Denies dizziness or HA. BP well controlled for now no changes needed. Continue current medications  Hyperlipidemia: (LDL goal < 70) 05/24/22 -Controlled, based on LDL at [facility] -Current treatment: Atorvastatin '80mg'$  daily Appropriate, Effective, Safe, Accessible  Zetia '10mg'$  daily Appropriate, Effective, Safe, Accessible -Medications previously tried: none noted  -Reports he just had labs drawn at [facility], last LDL was excellent. -Educated on Cholesterol goals;  Benefits of statin for ASCVD risk reduction; Importance of limiting foods high in cholesterol; -Denies any  adverse effects -Most recent LDL at [facility] was 63, continues to tolerate medication.  No changes needed at this time.  Diabetes (A1c goal <6.5%) 05/24/22 -Controlled, has been trending up -Current medications: Metformin '1000mg'$  BID with meals Appropriate, Query effective, -Medications previously tried: insulin glargine  -Current home glucose readings fasting glucose: 135-141 -Denies hypoglycemic/hyperglycemic  symptoms -Educated on A1c and blood sugar goals; Prevention and management of hypoglycemic episodes; Carbohydrate counting and/or plate method -Counseled to check feet daily and get yearly eye exams - Continues to work on lifestyle mods.  Aware that A1c has been creeping up.   He has upcoming visit with Dr. Yong Channel - recommend recheck A1c.  Patient with CHF would benefit from [place] if additional therapy is needed. No changes until October visit. Is due for diabetic kidney labs, A1c, and is bringing copy of eye exam.  Update 11/10/21 FBG - 130-150s He is eating two nutty buddy crackers per day.  States blood sugar is "creeping up."  We discussed trying to limit that to one cracker per day to see if that helps glucose. Has upcoming appt in April with PCP - recheck A1c and adjust treatment at that time. NO changes except lifestyle mods at this time.  Patient Goals/Self-Care Activities Patient will:  - take medications as prescribed check glucose daily, document, and provide at future appointments check blood pressure weekly, document, and provide at future appointments  Follow Up Plan: The care management team will reach out to the patient again over the next 180 days.              The patient verbalized understanding of instructions, educational materials, and care plan provided today and DECLINED offer to receive copy of patient instructions, educational materials, and care plan.  Telephone follow up appointment with pharmacy team member scheduled for: 4 months  Edythe Clarity, [place], PharmD Clinical Pharmacist  [facility] [phone]

## 2022-05-31 ENCOUNTER — Encounter: Payer: Self-pay | Admitting: *Deleted

## 2022-06-01 ENCOUNTER — Ambulatory Visit: Admit: 2022-06-01 | Discharge: 2022-06-01 | Payer: MEDICARE

## 2022-06-01 DIAGNOSIS — K5730 Diverticulosis of large intestine without perforation or abscess without bleeding: Secondary | ICD-10-CM | POA: Diagnosis not present

## 2022-06-01 DIAGNOSIS — K439 Ventral hernia without obstruction or gangrene: Secondary | ICD-10-CM | POA: Diagnosis not present

## 2022-06-01 DIAGNOSIS — K429 Umbilical hernia without obstruction or gangrene: Secondary | ICD-10-CM | POA: Diagnosis not present

## 2022-06-01 DIAGNOSIS — I723 Aneurysm of iliac artery: Secondary | ICD-10-CM | POA: Diagnosis not present

## 2022-06-01 MED ADMIN — iohexoL (OMNIPAQUE) 350 mg iodine/mL solution 100 mL: 100 mL | INTRAVENOUS | @ 15:00:00 | Stop: 2022-06-01

## 2022-06-01 MED ADMIN — iohexol (OMNIPAQUE) 240 mg iodine/mL oral solution 50 mL: 50 mL | ORAL | @ 14:00:00 | Stop: 2022-06-01

## 2022-06-02 DIAGNOSIS — K429 Umbilical hernia without obstruction or gangrene: Principal | ICD-10-CM

## 2022-06-02 DIAGNOSIS — E278 Other specified disorders of adrenal gland: Principal | ICD-10-CM

## 2022-06-02 NOTE — Unmapped (Signed)
The impression should also state note was made of an incidental right adrenal nodule measuring up to 1.0 cm in greatest axial dimension. This lesion is suboptimally evaluated on single phase images obtained. Consider further evaluation with dedicated adrenal mass CT versus MRI for more definitive characterization.     -----------------------------------------------          Impression   1.Note made of a moderate sized mesenteric fat-containing umbilical hernia. No internal fat stranding or free fluid is seen within the herniated contents to suggest ischemia or vascular compromise. Clinical correlation with physical exam is recommended.   2.Other chronic or incidental findings, as described within the body of the report.          Reviewed CT scan with Dr Cherly Hensen- regarding hernia, will refer to [facility] Hernia clinic. Referral order placed. Dr. Cherly Hensen reviewed patient's recent labs and would like repeat labs next week to reassess Cr.    Regarding Adrenal mass, will order adrenal mass MRI, rule out Cushing's syndrome. Called radiology to confirm order ( MRI abdomen with contrast)

## 2022-06-03 ENCOUNTER — Telehealth: Payer: Self-pay | Admitting: Family Medicine

## 2022-06-03 DIAGNOSIS — Z48298 Encounter for aftercare following other organ transplant: Secondary | ICD-10-CM | POA: Diagnosis not present

## 2022-06-03 DIAGNOSIS — R7989 Other specified abnormal findings of blood chemistry: Secondary | ICD-10-CM | POA: Diagnosis not present

## 2022-06-03 DIAGNOSIS — Z79899 Other long term (current) drug therapy: Secondary | ICD-10-CM | POA: Diagnosis not present

## 2022-06-03 DIAGNOSIS — Z941 Heart transplant status: Secondary | ICD-10-CM | POA: Diagnosis not present

## 2022-06-03 NOTE — Telephone Encounter (Signed)
Patient req CB 07/2022

## 2022-06-03 NOTE — Unmapped (Signed)
Called pt to set-up MRI appt.

## 2022-06-04 NOTE — Unmapped (Signed)
called pt about canceling MRI.

## 2022-06-07 DIAGNOSIS — Z941 Heart transplant status: Principal | ICD-10-CM

## 2022-06-07 MED ORDER — MYCOPHENOLATE MOFETIL 500 MG TABLET
ORAL_TABLET | Freq: Two times a day (BID) | ORAL | 3 refills | 90 days | Status: CP
Start: 2022-06-07 — End: 2023-06-07
  Filled 2022-08-09: qty 180, 90d supply, fill #0

## 2022-06-07 NOTE — Unmapped (Signed)
[facility] Refill Coordination Note    Specialty Medication(s) to be Shipped:   Transplant: mycophenolate mofetil 500mg  and tacrolimus 0.5mg     Other medication(s) to be shipped:  chlorthalidone 25 mg, diltiazem 180 mg, levothyroxine 75 mcg     Andrew Dickerson, DOB: 08-24-1956  Phone: There are no phone numbers on file.      All above HIPAA information was verified with patient.     Was a Nurse, learning disability used for this call? No    Completed refill call assessment today to schedule patient's medication shipment from the [facility] [phone]).  All relevant notes have been reviewed.     Specialty medication(s) and dose(s) confirmed: Regimen is correct and unchanged.   Changes to medications: [name] reports no changes at this time.  Changes to insurance: No  New side effects reported not previously addressed with a pharmacist or physician: None reported  Questions for the pharmacist: No    Confirmed patient received a Conservation officer, historic buildings and a Surveyor, mining with first shipment. The patient will receive a drug information handout for each medication shipped and additional FDA Medication Guides as required.       DISEASE/MEDICATION-SPECIFIC INFORMATION        N/A    SPECIALTY MEDICATION ADHERENCE     Medication Adherence    Patient reported X missed doses in the last month: 0  Specialty Medication: mycophenolate 500 mg tablet (CELLCEPT)  Patient is on additional specialty medications: Yes  Additional Specialty Medications: tacrolimus 0.5 MG capsule (PROGRAF)  Patient Reported Additional Medication X Missed Doses in the Last Month: 0  Patient is on more than two specialty medications: No                                Were doses missed due to medication being on hold? No    mycophenolate 500  mg: 10 days of medicine on hand   tacrolimus 0.5 mg: 10 days of medicine on hand       REFERRAL TO PHARMACIST     Referral to the pharmacist: Not needed      [facility]     Shipping address confirmed in Epic.     Delivery Scheduled: Yes, Expected medication delivery date: 06/11/22.     Medication will be delivered via UPS to the prescription address in Epic WAM.    Andrew Dickerson   [facility] Specialty Technician

## 2022-06-08 LAB — CBC W/ DIFFERENTIAL
BASOPHILS ABSOLUTE COUNT: 68 {cells}/uL (ref 0–200)
BASOPHILS RELATIVE PERCENT: 0.8 %
EOSINOPHILS ABSOLUTE COUNT: 145 {cells}/uL (ref 15–500)
EOSINOPHILS RELATIVE PERCENT: 1.7 %
HEMATOCRIT: 41.2 % (ref 38.5–50.0)
HEMOGLOBIN: 13.7 g/dL (ref 13.2–17.1)
LYMPHOCYTES ABSOLUTE COUNT: 1207 {cells}/uL (ref 850–3900)
LYMPHOCYTES RELATIVE PERCENT: 14.2 %
MEAN CORPUSCULAR HEMOGLOBIN CONC: 33.3 g/dL (ref 32.0–36.0)
MEAN CORPUSCULAR HEMOGLOBIN: 29.5 pg (ref 27.0–33.0)
MEAN CORPUSCULAR VOLUME: 88.8 fL (ref 80.0–100.0)
MEAN PLATELET VOLUME: 10 fL (ref 7.5–12.5)
MONOCYTES ABSOLUTE COUNT: 595 {cells}/uL (ref 200–950)
MONOCYTES RELATIVE PERCENT: 7 %
NEUTROPHILS ABSOLUTE COUNT: 6486 {cells}/uL (ref 1500–7800)
NEUTROPHILS RELATIVE PERCENT: 76.3 %
PLATELET COUNT: 300 10*3/uL (ref 140–400)
RED BLOOD CELL COUNT: 4.64 10*6/uL (ref 4.20–5.80)
RED CELL DISTRIBUTION WIDTH: 12.7 % (ref 11.0–15.0)
WHITE BLOOD CELL COUNT: 8.5 10*3/uL (ref 3.8–10.8)

## 2022-06-08 LAB — BASIC METABOLIC PANEL
BLOOD UREA NITROGEN: 22 mg/dL (ref 7–25)
BUN / CREAT RATIO: 15 (ref 6–22)
CALCIUM: 9.9 mg/dL (ref 8.6–10.3)
CHLORIDE: 103 mmol/L (ref 98–110)
CO2: 21 mmol/L (ref 20–32)
CREATININE: 1.47 mg/dL — ABNORMAL HIGH (ref 0.70–1.35)
EGFR CKD-EPI NON-AA MALE: 53 mL/min/{1.73_m2} — ABNORMAL LOW
GLUCOSE RANDOM: 134 mg/dL — ABNORMAL HIGH (ref 65–99)
MAGNESIUM: 1.8 mg/dL (ref 1.5–2.5)
POTASSIUM: 5 mmol/L (ref 3.5–5.3)
SODIUM: 135 mmol/L (ref 135–146)

## 2022-06-08 LAB — TACROLIMUS LEVEL, TROUGH: TACROLIMUS, TROUGH: 4.5 ug/L — ABNORMAL LOW (ref 5.0–20.0)

## 2022-06-10 MED FILL — CHLORTHALIDONE 25 MG TABLET: ORAL | 30 days supply | Qty: 30 | Fill #11

## 2022-06-10 MED FILL — LEVOTHYROXINE 75 MCG TABLET: ORAL | 30 days supply | Qty: 30 | Fill #11

## 2022-06-10 MED FILL — TACROLIMUS 0.5 MG CAPSULE, IMMEDIATE-RELEASE: ORAL | 30 days supply | Qty: 90 | Fill #3

## 2022-06-10 MED FILL — DILTIAZEM CD 180 MG CAPSULE,EXTENDED RELEASE 24 HR: ORAL | 30 days supply | Qty: 60 | Fill #11

## 2022-06-10 NOTE — Unmapped (Signed)
[name] 's Mycophenolate shipment will be canceled  as a result of the medication is too soon to refill until 07/24/2022.     I have reached out to the patient  at [phone] and communicated the delivery change. We will not reschedule the medication and have removed this/these medication(s) from the work request.  We have not confirmed the new delivery date.     Mycophenolate was last filled 09/05 for 90ds. All other meds sent as scheduled.

## 2022-06-16 NOTE — Unmapped (Signed)
Discussed recent labs with Andrew Gaskins, MD.  Plan is to Make No Changes  with repeat labs in 3 Months.  Discussed rise in Cr with Dr Cherly Hensen. Cr not grossly elevated. Will continue to monitor per routine.   Patient has retained lead so can not pursue MRI, per Dr Cherly Hensen, will order dedicated CT scan to follow up on mass    Sent patient MyChart message with lab results and follow up plan      Lab Results   Component Value Date    TACROLIMUS 4.5 (L) 06/03/2022     Goal: Tac: 5-8  Current Dose: Tacrolimus 1 mg/0.5 mg    Lab Results   Component Value Date    BUN 22 06/03/2022    CREATININE 1.47 (H) 06/03/2022    K 5.0 06/03/2022    GLU 134 (H) 06/03/2022    MG 1.8 06/03/2022     Lab Results   Component Value Date    WBC 8.5 06/03/2022    HGB 13.7 06/03/2022    HCT 41.2 06/03/2022    PLT 300 06/03/2022    NEUTROABS 6,486 06/03/2022    EOSABS 145 06/03/2022

## 2022-06-22 DIAGNOSIS — R935 Abnormal findings on diagnostic imaging of other abdominal regions, including retroperitoneum: Principal | ICD-10-CM

## 2022-06-22 DIAGNOSIS — E278 Other specified disorders of adrenal gland: Principal | ICD-10-CM

## 2022-06-22 NOTE — Unmapped (Addendum)
Order placed for abdominal CT scan with and without contrast.    Message sent to [facility] requesting the CT scan be scheduled on 10/26 if possible, per pt request.

## 2022-06-22 NOTE — Unmapped (Signed)
Called pt to tell him I made him an appt per [facility] on 10/[facility] at [facility].

## 2022-06-22 NOTE — Unmapped (Signed)
Tetanus. Done  Newest covid vac. Done  Dermatologist appointment 11/27  Flu and shingles to come     I have an appointment on the 26 for the hernia consult at 1:00. Is there anything we can schedule for my mass at that time         Received the above message from patient. Updated immunization history to reflect updated immunizations.Asked patient to update me when he received flu and Shingles vaccine.     Will call the radiology reading room to determine best CT scan to order to further address adrenal mass since patient unable to have MRI. Will attempt to schedule follow up CT on 10/26. Will follow up with patient tomorrow

## 2022-06-30 ENCOUNTER — Encounter: Payer: Self-pay | Admitting: Family Medicine

## 2022-06-30 ENCOUNTER — Ambulatory Visit (INDEPENDENT_AMBULATORY_CARE_PROVIDER_SITE_OTHER): Payer: Medicare HMO | Admitting: Family Medicine

## 2022-06-30 VITALS — BP 124/64 | HR 88 | Temp 98.0°F | Resp 16 | Ht 68.0 in | Wt 230.6 lb

## 2022-06-30 DIAGNOSIS — I5022 Chronic systolic (congestive) heart failure: Secondary | ICD-10-CM | POA: Diagnosis not present

## 2022-06-30 DIAGNOSIS — M25512 Pain in left shoulder: Secondary | ICD-10-CM | POA: Diagnosis not present

## 2022-06-30 DIAGNOSIS — Z23 Encounter for immunization: Secondary | ICD-10-CM | POA: Diagnosis not present

## 2022-06-30 DIAGNOSIS — E119 Type 2 diabetes mellitus without complications: Secondary | ICD-10-CM | POA: Diagnosis not present

## 2022-06-30 DIAGNOSIS — E785 Hyperlipidemia, unspecified: Secondary | ICD-10-CM

## 2022-06-30 DIAGNOSIS — E1169 Type 2 diabetes mellitus with other specified complication: Secondary | ICD-10-CM | POA: Diagnosis not present

## 2022-06-30 DIAGNOSIS — G8929 Other chronic pain: Secondary | ICD-10-CM | POA: Diagnosis not present

## 2022-06-30 DIAGNOSIS — I2510 Atherosclerotic heart disease of native coronary artery without angina pectoris: Secondary | ICD-10-CM

## 2022-06-30 DIAGNOSIS — E034 Atrophy of thyroid (acquired): Secondary | ICD-10-CM | POA: Diagnosis not present

## 2022-06-30 LAB — POCT GLYCOSYLATED HEMOGLOBIN (HGB A1C): Hemoglobin A1C: 6 % — AB (ref 4.0–5.6)

## 2022-06-30 MED ORDER — BLOOD GLUCOSE MONITOR KIT: PACK | 0 refills | Status: AC

## 2022-06-30 NOTE — Patient Instructions (Addendum)
Health Maintenance Due  Topic Date Due   Medicare Annual Wellness (AWV)  07/05/2022  You are eligible to schedule your annual wellness visit with our nurse specialist Otila Kluver.  Please consider scheduling this before you leave today- can sign up by phone  Sign release of information at the check out desk for last diabetic eye exam  Let us know when you get shingrix  Please see if [facility] can do CMP instead of BMP next visit so I can assess liver function  We will call you within two weeks about your referral to sports medicine. If you do not hear within 2 weeks, give [place] [facility] a call directly  Please stop by lab before you go If you have mychart- we will send your results within 3 business days of [place] receiving them.  If you do not have mychart- we will call you about results within 5 business days of [place] receiving them.  *please also note that you will see labs on mychart as soon as they post. I will later go in and write notes on them- will say "notes from Dr. Yong Channel"   Recommended follow up: Return in about 6 months (around 12/30/2022) for physical or sooner if needed.Schedule b4 you leave.

## 2022-06-30 NOTE — Progress Notes (Signed)
Phone [phone] In person visit   Subjective:   [name] is a 66 y.o. 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/2009    Priority: High   Diabetes mellitus type II, controlled ([place]) 12/13/2008    Priority: High   Coronary atherosclerosis 04/25/2007    Priority: High   Chronic abdominal pain 12/27/2015    Priority: Medium    BPH (benign prostatic hypertrophy) 02/26/2014    Priority: Medium    Hypothyroidism 05/14/2008    Priority: Medium    Essential hypertension 05/14/2008    Priority: Medium    ANEMIA, OTHER UNSPEC 04/03/2008    Priority: Medium    Hyperlipidemia associated with type 2 diabetes mellitus ([place]) 04/25/2007    Priority: Medium    GERD (gastroesophageal reflux disease) 06/19/2014    Priority: Low   Osteoarthritis 06/19/2014    Priority: Low   History of adenomatous polyp of colon 09/17/2013    Priority: Low    Medications- reviewed and updated Current Outpatient Medications  Medication Sig Dispense Refill   amLODipine (NORVASC) 10 MG tablet Take 10 mg by mouth daily. Per [facility]     aspirin EC 81 MG tablet Take 1 tablet (81 mg total) by mouth daily. 30 tablet 0   atorvastatin (LIPITOR) 80 MG tablet Take 1 tablet (80 mg total) by mouth daily at 6 PM. 30 tablet 0   blood glucose meter kit and supplies KIT Dispense based on patient and insurance preference.  Use up to four times daily as directed. 1 each 0   chlorthalidone (HYGROTON) 25 MG tablet Take 1 tablet (25 mg total) by mouth daily. 30 tablet 0   cholecalciferol (VITAMIN D) 1000 units tablet Take 3 tablets (3,000 Units total) by mouth daily. 90 tablet 0   diltiazem (TIAZAC) 180 MG 24 hr capsule Take 2 capsules (360 mg total) by mouth daily. 60 capsule 0   ezetimibe (ZETIA) 10 MG tablet Take 1 tablet by mouth once daily 90 tablet 0   famotidine (PEPCID) 20 MG tablet Take 20 mg by mouth in the morning and at bedtime.     levothyroxine (SYNTHROID, LEVOTHROID) 75 MCG tablet Take 1 tablet (75 mcg total) by mouth daily before breakfast. 30 tablet 0   magnesium oxide (MAG-OX) 400 MG tablet Take 1.5 tablets (600 mg total) by mouth 2 (two) times daily. 45 tablet 0   metFORMIN (GLUCOPHAGE) 1000 MG tablet TAKE 1 TABLET BY MOUTH TWICE DAILY WITH MEALS 180 tablet 0   mupirocin ointment (BACTROBAN) 2 % Apply 1 Application topically 2 (two) times daily. 22 g 0   mycophenolate (CELLCEPT) 500 MG tablet Take 1 tablet (500 mg total) by mouth 2 (two) times daily. 60 tablet 0   tacrolimus (PROGRAF) 0.5 MG capsule Take 2 capsules (1 mg total) by mouth 2 (two) times daily. (Patient taking differently: Take by mouth 2 (  two) times daily. 2 tablets in the AM 1 tablet in the PM) 120 capsule 0   tamsulosin (FLOMAX) 0.4 MG CAPS capsule Take 0.4 mg by mouth.     valsartan (DIOVAN) 160 MG tablet Take 320 mg by mouth daily.      furosemide (LASIX) 20 MG tablet Take 1 tablet (20 mg total) by mouth 2 (two) times daily for 3 days. (Patient not taking: Reported on 06/30/2022) 6 tablet 0   No current facility-administered medications for this visit.     Objective:  BP 124/64   Pulse 88   Temp 98 F (36.7 C) (Temporal)   Resp 16   Ht 5' 8" (1.727 m)   Wt 230 lb 9.6 oz (104.6 kg)   SpO2 99%   BMI 35.06 kg/m  Gen: NAD, resting comfortably CV: RRR no murmurs rubs or gallops Lungs: CTAB no crackles, wheeze,  rhonchi Abdomen: soft/nontender/nondistended/normal bowel sounds. No rebound or guarding.  Ext: trace edema Skin: warm, dry Shoulder: Inspection reveals no abnormalities, atrophy or asymmetry. Palpation is normal with no tenderness over AC joint or bicipital groove. ROM is limited as noted under left shoulder pain exam Very significant pain with attempted empty can test- we deferred further testing     Assessment and Plan   #Left shoulder pain S:Injured shoulder in June.  He was reaching backwards with hand over shoulder felt immediate pain 10/10- ongoing pain up to 24/49  if moves it certain ways.  - no chest pain or shortness of breath with it. Never happens with neck motion without moving shoulder. Pain does radiate to the elbow and even into the hand.  A/P: severe left shoulder pain and limited range of motion due to pain- only able to abduct to about 75 degrees and forward flex to 110 degrees. Positive empty can before I even applied pressure- suspect rotator cuff disease or bursitis- will refer to sports medicine for evaluation- thankfully a1c is low enough that steroid shot would be acceptable if needed   #CT was done to evaluate umbilical hernia due to prior leakage externally. Hernia exam was reassuring- no abscess. Also had incidental finding of adrenal nodule 1 cm- planned enhanced CT tomorrow. He will let us kno wresults. Discussed could have further workup. Also has hernia consult at [facility] to consider repair  #Heart transplant/immunosuppressed-patient follows up closely with [facility] S: Medication: Remains on mycophenolate and tacrolimus  A/P: overall stable- continue current meds - he is stayin gup to date with immunizations including covid and flu  #CAD  #hyperlipidemia S: Medication:Atorvastatin 80 mg, Zetia 10 mg, aspirin 81 mg -upcoming stress test with [facility] but as above no CP or SOB  A/P: CAD-asymptomatic and closely followed by [facility]- continue current meds -he is going ot ask  for LFTs with next labs Hyperlipidemia- LDL 63 on last check in April- up to date   # Diabetes S: Medication:Metformin 1000 mg twice daily CBGs- 130s or 140s Exercise and diet- not exercising- encouraged. Has done a good job with weight loss down 4 lbs A/P: well controlled with POC a1c at 6.0- continue current meds. His home projections very close at 6.25-    #CHF  #hypertension S: medication: Valsartan 320 mg, chlorthalidone 25 mg, amlodipine 10 mg, diltiazem 360 mg extended release -Lasix usage: none- has done well  Home readings #s: can be 130s or 140s at home but this is before meds BP Readings from Last 3 Encounters:  06/30/22 124/64  03/30/22 (!) 145/83  12/29/21 126/62  Wt Readings from Last 3 Encounters:  06/30/22 230 lb 9.6 oz (104.6 kg)  03/30/22 241 lb (109.3 kg)  12/29/21 234 lb (106.1 kg)   A/P: CHF well controlled- weight trending down. No increased swelling- continue current meds  HTN- Controlled. Continue current medications. Asked him to check home BP an hour or two after meds   #hypothyroidism S: compliant On thyroid medication-levothyroxine 75 mcg A/P:tsh normal in April with [facility]- continue to monitor with them with bloodwork   #BPH S: Medication: Tamsulosin 0.4 mg. Overall stable  A/P: imperfect but stable- continue current meds    #GERD-Pepcid controls for the most part   Recommended follow up: Return in about 6 months (around 12/30/2022) for physical or sooner if needed.Schedule b4 you leave. Future Appointments  Date Time Provider [place]  09/28/2022  3:30 PM [facility] PHARMACIST [facility]    Lab/Order associations:   ICD-10-CM   1. Controlled type 2 diabetes mellitus without complication, without long-term current use of insulin (HCC)  E11.9 Microalbumin / creatinine urine ratio    POCT HgB A1C    2. Chronic left shoulder pain  M25.512 Ambulatory referral to Sports Medicine   G89.29     3. Need for immunization against  influenza  Z23 Flu Vaccine QUAD High Dose(Fluad)    4. Atherosclerosis of native coronary artery of native heart without angina pectoris  I25.10     5. Chronic systolic heart failure (HCC)  I50.22     6. Hypothyroidism due to acquired atrophy of thyroid  E03.4     7. Hyperlipidemia associated with type 2 diabetes mellitus (HCC)  E11.69    E78.5       Meds ordered this encounter  Medications   blood glucose meter kit and supplies KIT    Sig: Dispense based on patient and insurance preference. Use up to four times daily as directed.    Dispense:  1 each    Refill:  0    May adjust per patient preference and insurance coverage on glucometer and strips    Order Specific Question:   Number of strips    Answer:   100    Order Specific Question:   Number of lancets    Answer:   100    Return precautions advised.  Garret Reddish, MD

## 2022-07-01 ENCOUNTER — Ambulatory Visit: Admit: 2022-07-01 | Discharge: 2022-07-02 | Payer: MEDICARE

## 2022-07-01 DIAGNOSIS — K429 Umbilical hernia without obstruction or gangrene: Principal | ICD-10-CM

## 2022-07-01 DIAGNOSIS — R935 Abnormal findings on diagnostic imaging of other abdominal regions, including retroperitoneum: Principal | ICD-10-CM

## 2022-07-01 DIAGNOSIS — E278 Other specified disorders of adrenal gland: Principal | ICD-10-CM

## 2022-07-01 DIAGNOSIS — Z79899 Other long term (current) drug therapy: Secondary | ICD-10-CM | POA: Diagnosis not present

## 2022-07-01 DIAGNOSIS — E1122 Type 2 diabetes mellitus with diabetic chronic kidney disease: Secondary | ICD-10-CM | POA: Diagnosis not present

## 2022-07-01 DIAGNOSIS — Z7982 Long term (current) use of aspirin: Secondary | ICD-10-CM | POA: Diagnosis not present

## 2022-07-01 DIAGNOSIS — N189 Chronic kidney disease, unspecified: Secondary | ICD-10-CM | POA: Diagnosis not present

## 2022-07-01 DIAGNOSIS — I129 Hypertensive chronic kidney disease with stage 1 through stage 4 chronic kidney disease, or unspecified chronic kidney disease: Secondary | ICD-10-CM | POA: Diagnosis not present

## 2022-07-01 DIAGNOSIS — K760 Fatty (change of) liver, not elsewhere classified: Secondary | ICD-10-CM | POA: Diagnosis not present

## 2022-07-01 DIAGNOSIS — R911 Solitary pulmonary nodule: Secondary | ICD-10-CM | POA: Diagnosis not present

## 2022-07-01 NOTE — Unmapped (Unsigned)
[facility] CLIN[facility] NOTE       New Consult Note    Patient Name: Andrew Dickerson  Medical Record Number: [id number]  Date of Service: 07/01/2022  Teaching Physician: Dr. Sallyanne Havers, MD    Referring Physician: Liliane Shi, Md  [address]  [place],  [place] [address]      Assessment/Recommendations:  [name] 66 y.o. male with Past Medical history of open heart transplant (07/2012), HTN, HLD, DMII, CKD, a Body mass index is 35.34 kg/m??. who is being seen today for evaluation and surgical discussion of umbilical hernia.      - Patient prefers conservative management and declines surgical intervention at this time  - Return in 6 months for follow-up evaluation     Umbilical Hernia    Risks of surgery discussed today which included, but were not limited to, bleeding, infection, hernia recurrence, and nerve damage. We discussed that will likely be discharged on the day of surgery, but could be admitted for any peri-operative complication or uncontrolled pain. Will need to avoid heavy lifting for 6 weeks post-operatively and will only be provided a 2-3 day supply of narcotic pain medication.     We discussed the natural histories of hernias and that they do not get better on their own, but that surgery isn't always mandatory. We discussed conservative, non-operative management with watchful waiting, hernia belts, etc., and operative repair both electively and emergently. We discussed that some people choose elective surgical management when hernias cause negative impact on QOL. Regardless of the reason for surgery, the risks and benefits have to be carefully weighed.      The patient would like hold off on surgical intervention at this time and would prefer to manage conservatively. Patient to continue follow up with Primary Care physician.      TEACHING ATTENDING ATTESTATION     [phone] with umbilical hernia without symptoms. He prefers to monitor and will return in 6-12 months for reevaluation.    I spent 35 minutes reviewing his complex medical history, taking a history and physical, and discussing the risks and benefits of surgery in detail.     I attest that I have reviewed the medical student note and that the components of the history of the present illness, the physical exam, and the assessment and plan documented were performed by me or were performed in my presence by the student where I verified the documentation and performed (or re-performed) the exam and medical decision making.    Suella Broad, MD MPH FACS  Assistant Professor  Division of Trauma/Critical Care and Acute Care Surgery  Pager: [phone]            Chief Complaint: Umbilical Hernia    History of Present Illness:  Andrew Dickerson 66 y.o. male coming in for evaluation of  umbilical hernia at the request of Dr. Cherly Hensen.     Patient reports he has had this umbilical hernia for many years, since before his heart transplant in 2013, but does not remember exactly when he first noticed it. He has been asymptomatic until late July when he noticed fluid leaking from the area. He sought medical care at that time, was given bactroban, and the leaking resolved. He states the area is not painful, 0/10, and that the hernia has never caused him pain in the past. It has always been easily reducible but has noticed recently that it is slightly less reducible, but is  almost fully reducible when he is supine.     Pt has a history of open heart transplant at [facility] 07/2012.    Patient denies obstructive symptoms: N/V/D/C.     Patient denies chest pain, SOB or dyspnea on exertion.    Review of Systems    A 12 system review of systems was negative except as noted in HPI    Allergies    NKDA      Medications      Current Outpatient Medications   Medication Sig Dispense Refill    amLODIPine (NORVASC) 10 MG tablet Take 1 tablet (10 mg total) by mouth daily. 90 tablet 3    aspirin (ADULT LOW DOSE ASPIRIN) 81 MG tablet Take 1 tablet (81 mg total) by mouth daily. 90 tablet 3    atorvastatin (LIPITOR) 80 MG tablet Take 1 tablet (80 mg total) by mouth daily. 90 tablet 3    blood sugar diagnostic (ACCU-CHEK AVIVA PLUS TEST STRP) Strp CHECK BLOOD SUGAR BEFORE MEALS, AT BEDTIME, & AS NEEDED FOR HYPERGLYCEMIA UP TO 6 TIMES A DAY 200 strip 11    chlorthalidone (HYGROTON) 25 MG tablet Take 1 tablet (25 mg total) by mouth daily. 30 tablet 11    cholecalciferol, vitamin D3, 1,000 unit capsule Take 3 capsules (75 mcg total) by mouth daily.      dilTIAZem (CARDIZEM CD) 180 MG 24 hr capsule Take 2 capsules (360 mg total) by mouth daily. 60 capsule 11    ezetimibe (ZETIA) 10 mg tablet Take 1 tablet (10 mg total) by mouth daily.      famotidine (PEPCID) 20 MG tablet Take 1 tablet (20 mg total) by mouth Two (2) times a day. 180 tablet 3    levothyroxine (SYNTHROID) 75 MCG tablet Take 1 tablet (75 mcg total) by mouth daily. 30 tablet 11    magnesium 250 mg Tab Take 250 mg by mouth two (2) times a day.      metFORMIN (GLUCOPHAGE) 1000 MG tablet Take 1 tablet (1,000 mg total) by mouth Two (2) times a day.      mycophenolate (CELLCEPT) 500 mg tablet Take 1 tablet (500 mg total) by mouth Two (2) times a day. 180 tablet 3    tacrolimus (PROGRAF) 0.5 MG capsule Take 2 capsules (1 mg total) by mouth daily AND 1 capsule (0.5 mg total) nightly. 90 capsule 11    tamsulosin (FLOMAX) 0.4 mg capsule TAKE 1 CAPSULE EVERY DAY 90 capsule 3    valsartan (DIOVAN) 160 MG tablet Take 2 tablets (320 mg total) by mouth daily. 180 tablet 3     No current facility-administered medications for this visit.    (Not in a hospital admission)       Past Medical History    Past Medical History:   Diagnosis Date    Coronary atherosclerosis of native coronary artery 04/21/2009    12/92: CABG x 5 with LIMA to LAD and saphenous grafts to diagonal 1, obtuse marginal, distal circ and distal PDA 8/00: Repeat CABG x 3 with redo free left radial to distal LAD, saphenous to RCA, obtuse margina 2005: multiple stents placed to vein grafts and native arteries     DM (diabetes mellitus) (CMS-HCC) 12/27/2012    Hypothyroidism 05/07/2011    LVAD (left ventricular assist device) present (CMS-HCC) 12/27/2012    05/09/09 received heart mate II     Mixed hyperlipidemia 05/07/2011    Obesity 12/27/2012         Past Surgical History  Past Surgical History:   Procedure Laterality Date    HEART TRANSPLANT  07/18/12    CMV D-/R-    LEFT VENTRICULAR ASSIST DEVICE  05/09/09    PR BIOPSY OF HEART LINING N/A 09/04/2015    Procedure: Left/Right Heart Catheterization W Biopsy;  Surgeon: Lesle Reek, MD;  Location: [facility] CATH;  Service: Cardiology    PR CATH PLACE/CORON ANGIO, IMG SUPER/INTERP,R&L HRT CATH, L HRT VENTRIC N/A 08/24/2016    Procedure: Left/Right Heart Catheterization W Intervention;  Surgeon: Lesle Reek, MD;  Location: [facility] CATH;  Service: Cardiology    PR CATH PLACE/CORON ANGIO, IMG SUPER/INTERP,W LEFT HEART VENTRICULOGRAPHY N/A 07/26/2017    Procedure: Left Heart Catheterization;  Surgeon: Rosana Hoes, MD;  Location: [facility] CATH;  Service: Cardiology    PR CATH PLACE/CORON ANGIO, IMG SUPER/INTERP,W LEFT HEART VENTRICULOGRAPHY N/A 08/13/2019    Procedure: Left Heart Catheterization;  Surgeon: Marlaine Hind, MD;  Location: [facility] CATH;  Service: Cardiology    PR CATH PLACE/CORON ANGIO, IMG SUPER/INTERP,W LEFT HEART VENTRICULOGRAPHY N/A 12/14/2021    Procedure: Left Heart Catheterization;  Surgeon: Neal Dy, MD;  Location: [facility] CATH;  Service: Cardiology         Family History    The patient's family history includes Heart disease in his maternal grandfather..      Social History:    Tobacco use: denies  Alcohol use: denies  Drug use: denies      Vital Signs  Blood pressure 125/65, pulse 88, temperature 36.1 ??C (97 ??F), height 172.7 cm (5' 8), weight (!) 105.4 kg (232 lb 6.4 oz).  BMI: Estimated body mass index is 35.34 kg/m?? as calculated from the following:    Height as of this encounter: 172.7 cm (5' 8).    Weight as of this encounter: 105.4 kg (232 lb 6.4 oz).    Physical Exam    General Appearance:  No acute distress, well appearing and well nourished.   Eyes:  Conjuctiva and lids appear normal.   Ears:  Hearing is grossly normal.   Throat: Lips, mucosa, and tongue normal. Oral mucosa moist.   Neck: Supple, no adenopathy   Pulmonary:    Normal respiratory effort on RA.  Lungs were clear to auscultation  Bilaterally. No WRR   Cardiovascular:  Regular rhythm, normal rate, normal S1/S2, without murmur   Abdomen:   Soft, non-tender, moderately sized umbilical hernia that is reducible.  No organomegaly is present.    Musculoskeletal: Normal gait.  Extremities without erythema or edema.   Skin: Skin color, texture, turgor normal, no rashes or lesions are appreciated.   Neurologic: No motor abnormalities noted.  Sensation grossly intact.   Psychiatric: Oriented to person, place, and time.         Test Results  Imaging: CT abdomen from today shows a fat containing umbilical hernia.      Benedetto Goad, MS3

## 2022-07-01 NOTE — Unmapped (Signed)
Follow up visit in 6 months, or sooner if needed (symptoms of pain, infection, etc. develop)

## 2022-07-02 NOTE — Progress Notes (Unsigned)
[name] D.[place] Phone: [phone]   Assessment and Plan:     There are no diagnoses linked to this encounter.  ***   Pertinent previous records reviewed include ***   Follow Up: ***     Subjective:   I, [name], am serving as a Education administrator for Doctor [name]  Chief Complaint: left shoulder pain   HPI:   07/05/2022 Patient is a 66 year old male complaining of left shoulder pain. Patient states  Relevant Historical Information: ***  Additional pertinent review of systems negative.   Current Outpatient Medications:    amLODipine (NORVASC) 10 MG tablet, Take 10 mg by mouth daily. Per [facility], Disp: , Rfl:    aspirin EC 81 MG tablet, Take 1 tablet (81 mg total) by mouth daily., Disp: 30 tablet, Rfl: 0   atorvastatin (LIPITOR) 80 MG tablet, Take 1 tablet (80 mg total) by mouth daily at 6 PM., Disp: 30 tablet, Rfl: 0   blood glucose meter kit and supplies KIT, Dispense based on patient and insurance preference. Use up to four times daily as directed., Disp: 1 each, Rfl: 0   chlorthalidone (HYGROTON) 25 MG tablet, Take 1 tablet (25 mg total) by mouth daily., Disp: 30 tablet, Rfl: 0   cholecalciferol (VITAMIN D) 1000 units tablet, Take 3 tablets (3,000 Units total) by mouth daily., Disp: 90 tablet, Rfl: 0   diltiazem (TIAZAC) 180 MG 24 hr capsule, Take 2 capsules (360 mg total) by mouth daily., Disp: 60 capsule, Rfl: 0   ezetimibe (ZETIA) 10 MG tablet, Take 1 tablet by mouth once daily, Disp: 90 tablet, Rfl: 0   famotidine (PEPCID) 20 MG tablet, Take 20 mg by mouth in the morning and at bedtime., Disp: , Rfl:    furosemide (LASIX) 20 MG tablet, Take 1 tablet (20 mg total) by mouth 2 (two) times daily for 3 days. (Patient not taking: Reported on 06/30/2022), Disp: 6 tablet, Rfl: 0   levothyroxine (SYNTHROID, LEVOTHROID) 75 MCG tablet, Take 1 tablet (75 mcg total) by mouth daily before  breakfast., Disp: 30 tablet, Rfl: 0   magnesium oxide (MAG-OX) 400 MG tablet, Take 1.5 tablets (600 mg total) by mouth 2 (two) times daily., Disp: 45 tablet, Rfl: 0   metFORMIN (GLUCOPHAGE) 1000 MG tablet, TAKE 1 TABLET BY MOUTH TWICE DAILY WITH MEALS, Disp: 180 tablet, Rfl: 0   mupirocin ointment (BACTROBAN) 2 %, Apply 1 Application topically 2 (two) times daily., Disp: 22 g, Rfl: 0   mycophenolate (CELLCEPT) 500 MG tablet, Take 1 tablet (500 mg total) by mouth 2 (two) times daily., Disp: 60 tablet, Rfl: 0   tacrolimus (PROGRAF) 0.5 MG capsule, Take 2 capsules (1 mg total) by mouth 2 (two) times daily. (Patient taking differently: Take by mouth 2 (two) times daily. 2 tablets in the AM 1 tablet in the PM), Disp: 120 capsule, Rfl: 0   tamsulosin (FLOMAX) 0.4 MG CAPS capsule, Take 0.4 mg by mouth., Disp: , Rfl:    valsartan (DIOVAN) 160 MG tablet, Take 320 mg by mouth daily. , Disp: , Rfl:    Objective:     There were no vitals filed for this visit.    There is no height or weight on file to calculate BMI.    Physical Exam:    ***   Electronically signed by:  [name] D.[facility] 7:53 AM 07/02/22

## 2022-07-05 ENCOUNTER — Other Ambulatory Visit: Payer: Self-pay | Admitting: Family Medicine

## 2022-07-05 ENCOUNTER — Ambulatory Visit: Payer: Medicare HMO | Admitting: Sports Medicine

## 2022-07-05 ENCOUNTER — Ambulatory Visit (INDEPENDENT_AMBULATORY_CARE_PROVIDER_SITE_OTHER): Payer: Medicare HMO

## 2022-07-05 VITALS — BP 122/84 | HR 95 | Ht 68.0 in | Wt 230.0 lb

## 2022-07-05 DIAGNOSIS — M19012 Primary osteoarthritis, left shoulder: Secondary | ICD-10-CM | POA: Diagnosis not present

## 2022-07-05 DIAGNOSIS — M7582 Other shoulder lesions, left shoulder: Secondary | ICD-10-CM | POA: Diagnosis not present

## 2022-07-05 DIAGNOSIS — M25512 Pain in left shoulder: Secondary | ICD-10-CM

## 2022-07-05 DIAGNOSIS — G8929 Other chronic pain: Secondary | ICD-10-CM

## 2022-07-05 NOTE — Patient Instructions (Addendum)
Good to see you  Pt referral  Shoulder HEP  3-4 week follow up

## 2022-07-06 NOTE — Unmapped (Signed)
Reviewed  results from a CT ABDOMEN WO CONTRAST on 10/26  with Dr. Cherly Hensen. Test had been completed to follow up on adrenal mass found on the CT of the abdomen with contrast on 06/01/22. A MRI had initially been recommended to follow up, but due to a retained lead, a CT scan was ordered to follow up on the mass.     Results:      IMPRESSION:   -There is a 1.1 cm right adrenal nodule with imaging characteristics consistent with benign adrenal adenoma.      - There is a 4 mm right middle lobe pulmonary nodule. Consider follow-up CT in 12 months to ensure stability.      Dr. Cherly Hensen reported that the pt should have a non contrast CHEST CT  in 12 months to follow up pulm nodule on abd CT.

## 2022-07-07 NOTE — Unmapped (Signed)
LVM with pt to call me back to schedule appts in Jan.

## 2022-07-08 MED ORDER — CHLORTHALIDONE 25 MG TABLET
ORAL_TABLET | Freq: Every day | ORAL | 11 refills | 30 days
Start: 2022-07-08 — End: 2023-07-08

## 2022-07-08 MED ORDER — LEVOTHYROXINE 75 MCG TABLET
ORAL_TABLET | Freq: Every day | ORAL | 11 refills | 30 days
Start: 2022-07-08 — End: 2023-07-08

## 2022-07-08 MED ORDER — DILTIAZEM CD 180 MG CAPSULE,EXTENDED RELEASE 24 HR
ORAL_CAPSULE | Freq: Every day | ORAL | 11 refills | 30 days
Start: 2022-07-08 — End: ?

## 2022-07-08 NOTE — Unmapped (Signed)
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     Andrew Dickerson, DOB: May 04, 1956  Phone: There are no phone numbers on file.      All above HIPAA information was verified with patient.     Was a Nurse, learning disability used for this call? No    Completed refill call assessment today to schedule patient's medication shipment from the [facility] [phone]).  All relevant notes have been reviewed.     Specialty medication(s) and dose(s) confirmed: Regimen is correct and unchanged.   Changes to medications: [name] reports no changes at this time.  Changes to insurance: No  New side effects reported not previously addressed with a pharmacist or physician: None reported  Questions for the pharmacist: No    Confirmed patient received a Conservation officer, historic buildings and a Surveyor, mining with first shipment. The patient will receive a drug information handout for each medication shipped and additional FDA Medication Guides as required.       DISEASE/MEDICATION-SPECIFIC INFORMATION        N/A    SPECIALTY MEDICATION ADHERENCE     Medication Adherence    Patient reported X missed doses in the last month: 0  Specialty Medication: tacrolimus 0.5 mg  Patient is on additional specialty medications: No                                Were doses missed due to medication being on hold? No    tacrolimus 0.5  mg: 6 days of medicine on hand       REFERRAL TO PHARMACIST     Referral to the pharmacist: Not needed      [facility]     Shipping address confirmed in Epic.     Delivery Scheduled: Yes, Expected medication delivery date: 07/12/22.     Medication will be delivered via UPS to the prescription address in Epic WAM.    Andrew Dickerson   [facility] Specialty Technician

## 2022-07-09 MED ORDER — LEVOTHYROXINE 75 MCG TABLET
ORAL_TABLET | Freq: Every day | ORAL | 11 refills | 30 days | Status: CP
Start: 2022-07-09 — End: 2023-07-09
  Filled 2022-07-09: qty 30, 30d supply, fill #0

## 2022-07-09 MED ORDER — DILTIAZEM CD 180 MG CAPSULE,EXTENDED RELEASE 24 HR
ORAL_CAPSULE | Freq: Every day | ORAL | 11 refills | 30 days | Status: CP
Start: 2022-07-09 — End: ?
  Filled 2022-07-09: qty 60, 30d supply, fill #0

## 2022-07-09 MED ORDER — CHLORTHALIDONE 25 MG TABLET
ORAL_TABLET | Freq: Every day | ORAL | 11 refills | 30 days | Status: CP
Start: 2022-07-09 — End: 2023-07-09
  Filled 2022-07-09: qty 30, 30d supply, fill #0

## 2022-07-09 MED FILL — TACROLIMUS 0.5 MG CAPSULE, IMMEDIATE-RELEASE: ORAL | 30 days supply | Qty: 90 | Fill #4

## 2022-07-12 NOTE — Unmapped (Signed)
pt called me back to schedule Jan.appts.

## 2022-07-13 ENCOUNTER — Ambulatory Visit (INDEPENDENT_AMBULATORY_CARE_PROVIDER_SITE_OTHER): Payer: Medicare HMO

## 2022-07-13 VITALS — Wt 222.0 lb

## 2022-07-13 DIAGNOSIS — Z0000 Encounter for general adult medical examination without abnormal findings: Secondary | ICD-10-CM

## 2022-07-13 NOTE — Patient Instructions (Signed)
Mr. [name] , Thank you for taking time to come for your Medicare Wellness Visit. I appreciate your ongoing commitment to your health goals. Please review the following plan we discussed and let me know if I can assist you in the future.   These are the goals we discussed:  Goals      Maintain current health status     Monitor and Manage My Blood Sugar-Diabetes Type 2     Timeframe:  Long-Range Goal Priority:  High Start Date:  04/28/21                           Expected End Date:  10/29/21                     Follow Up Date 08/05/21    - check blood sugar at prescribed times - check blood sugar before and after exercise - check blood sugar if I feel it is too high or too low - take the blood sugar log to all doctor visits    Why is this important?   Checking your blood sugar at home helps to keep it from getting very high or very low.  Writing the results in a diary or log helps the doctor know how to care for you.  Your blood sugar log should have the time, date and the results.  Also, write down the amount of insulin or other medicine that you take.  Other information, like what you ate, exercise done and how you were feeling, will also be helpful.     Notes:      Patient Stated     Lose a little more weight      [place] (see longitudinal plan of care for additional care plan information)  Current Barriers:  Chronic Disease Management support, education, and care coordination needs related to Hypertension, Hyperlipidemia, and Diabetes   Hypertension BP Readings from Last 3 Encounters:  06/16/20 130/70  04/14/20 (!) 141/69  02/23/19 130/76  Pharmacist Clinical Goal(s): Over the next 365 days, patient will work with PharmD and providers to maintain BP goal <130/80 Current regimen:  Amlodipine 10 mg once daily  Chlorthalidone 25 mg once daily Diltiazem 180 mg once daily  Valsartan 160 mg tablet - 2 tablets (320 mg) once daily   Interventions: Reviewed home monitoring recommendations Reviewed diet and exercise - Maintain a healthy weight and exercise regularly, as directed by your health care provider. Eat healthy foods, such as: Lean proteins, complex carbohydrates, fresh fruits and vegetables, low-fat dairy products, healthy fats. Patient self care activities - Over the next 365 days, patient will: Check BP at least once every 1-2 weeks, document, and provide at future appointments Ensure daily salt intake < 2300 mg/day  Hyperlipidemia Lab Results  Component Value Date/Time   LDLCALC 88 01/21/2020 12:00 AM   LDLDIRECT 69 02/12/2014 12:00 AM  Pharmacist Clinical Goal(s): Over the next 365 days, patient will work with PharmD and providers to maintain LDL goal < 70 Current regimen:  Zetia 10 mg once daily Atorvastatin 80 mg once daily  Interventions: Reviewed side effects/tolerability - none noted at this time Patient self care activities - Over the next 365 days, patient will: Continue current management  Diabetes Lab Results  Component Value Date/Time   HGBA1C 5.8 06/30/2020 12:00 AM   HGBA1C 6.1 01/21/2020 12:00 AM  Pharmacist Clinical Goal(s): Over the next  365 days, patient will work with PharmD and providers to maintain A1c goal <7% Current regimen:  Metformin 1000 mg twice daily  Interventions: Reviewed plate method for DM diet. Patient self care activities - Over the next 365 days, patient will: Check blood sugar once daily, document, and provide at future appointments  Medication management Pharmacist Clinical Goal(s): Over the next 365 days, patient will work with PharmD and providers to maintain optimal medication adherence Current pharmacy: [facility] Interventions Comprehensive medication review performed. Continue current medication management strategy. Patient self care activities - Over the next 365 days, patient will: Take medications as prescribed Report any  questions or concerns to PharmD and/or provider(s) Initial goal documentation.        This is a list of the screening recommended for you and due dates:  Health Maintenance  Topic Date Due   Yearly kidney health urinalysis for diabetes  Never done   Zoster (Shingles) Vaccine (1 of 2) Never done   Eye exam for diabetics  06/20/2021   Complete foot exam   06/18/2022   HIV Screening  01/08/2064*   COVID-19 Vaccine (5 - Pfizer risk series) 08/11/2022   Hemoglobin A1C  12/30/2022   Yearly kidney function blood test for diabetes  03/31/2023   Medicare Annual Wellness Visit  07/14/2023   Pneumonia Vaccine (4 - PPSV23 or PCV20) 02/23/2024   Colon Cancer Screening  04/26/2027   Tetanus Vaccine  05/12/2032   Flu Shot  Completed   Hepatitis C Screening: USPSTF Recommendation to screen - Ages 18-79 yo.  Addressed   HPV Vaccine  Aged Out  *Topic was postponed. The date shown is not the original due date.    Advanced directives: Advance directive discussed with you today. Even though you declined this today please call our office should you change your mind and we can give you the proper paperwork for you to fill out.   Conditions/risks identified: to lose some more weight   Next appointment: Follow up in one year for your annual wellness visit.   Preventive Care 52 Years and Older, Male  Preventive care refers to lifestyle choices and visits with your health care provider that can promote health and wellness. What does preventive care include? A yearly physical exam. This is also called an annual well check. Dental exams once or twice a year. Routine eye exams. Ask your health care provider how often you should have your eyes checked. Personal lifestyle choices, including: Daily care of your teeth and gums. Regular physical activity. Eating a healthy diet. Avoiding tobacco and drug use. Limiting alcohol use. Practicing safe sex. Taking low doses of aspirin every day. Taking  vitamin and mineral supplements as recommended by your health care provider. What happens during an annual well check? The services and screenings done by your health care provider during your annual well check will depend on your age, overall health, lifestyle risk factors, and family history of disease. Counseling  Your health care provider may ask you questions about your: Alcohol use. Tobacco use. Drug use. Emotional well-being. Home and relationship well-being. Sexual activity. Eating habits. History of falls. Memory and ability to understand (cognition). Work and work Statistician. Screening  You may have the following tests or measurements: Height, weight, and BMI. Blood pressure. Lipid and cholesterol levels. These may be checked every 5 years, or more frequently if you are over 46 years old. Skin check. Lung cancer screening. You may have this screening every year starting at age 68 if  you have a 30-pack-year history of smoking and currently smoke or have quit within the past 15 years. Fecal occult blood test (FOBT) of the stool. You may have this test every year starting at age 42. Flexible sigmoidoscopy or colonoscopy. You may have a sigmoidoscopy every 5 years or a colonoscopy every 10 years starting at age 36. Prostate cancer screening. Recommendations will vary depending on your family history and other risks. Hepatitis C blood test. Hepatitis B blood test. Sexually transmitted disease (STD) testing. Diabetes screening. This is done by checking your blood sugar (glucose) after you have not eaten for a while (fasting). You may have this done every 1-3 years. Abdominal aortic aneurysm (AAA) screening. You may need this if you are a current or former smoker. Osteoporosis. You may be screened starting at age 31 if you are at high risk. Talk with your health care provider about your test results, treatment options, and if necessary, the need for more tests. Vaccines  Your  health care provider may recommend certain vaccines, such as: Influenza vaccine. This is recommended every year. Tetanus, diphtheria, and acellular pertussis (Tdap, Td) vaccine. You may need a Td booster every 10 years. Zoster vaccine. You may need this after age 50. Pneumococcal 13-valent conjugate (PCV13) vaccine. One dose is recommended after age 38. Pneumococcal polysaccharide (PPSV23) vaccine. One dose is recommended after age 51. Talk to your health care provider about which screenings and vaccines you need and how often you need them. This information is not intended to replace advice given to you by your health care provider. Make sure you discuss any questions you have with your health care provider. Document Released: 09/19/2015 Document Revised: 05/12/2016 Document Reviewed: 06/24/2015 Elsevier Interactive Patient Education  2017 [place] Prevention in the Home Falls can cause injuries. They can happen to people of all ages. There are many things you can do to make your home safe and to help prevent falls. What can I do on the outside of my home? Regularly fix the edges of walkways and driveways and fix any cracks. Remove anything that might make you trip as you walk through a door, such as a raised step or threshold. Trim any bushes or trees on the path to your home. Use bright outdoor lighting. Clear any walking paths of anything that might make someone trip, such as rocks or tools. Regularly check to see if handrails are loose or broken. Make sure that both sides of any steps have handrails. Any raised decks and porches should have guardrails on the edges. Have any leaves, snow, or ice cleared regularly. Use sand or salt on walking paths during winter. Clean up any spills in your garage right away. This includes oil or grease spills. What can I do in the bathroom? Use night lights. Install grab bars by the toilet and in the tub and shower. Do not use towel bars as  grab bars. Use non-skid mats or decals in the tub or shower. If you need to sit down in the shower, use a plastic, non-slip stool. Keep the floor dry. Clean up any water that spills on the floor as soon as it happens. Remove soap buildup in the tub or shower regularly. Attach bath mats securely with double-sided non-slip rug tape. Do not have throw rugs and other things on the floor that can make you trip. What can I do in the bedroom? Use night lights. Make sure that you have a light by your bed that is  easy to reach. Do not use any sheets or blankets that are too big for your bed. They should not hang down onto the floor. Have a firm chair that has side arms. You can use this for support while you get dressed. Do not have throw rugs and other things on the floor that can make you trip. What can I do in the kitchen? Clean up any spills right away. Avoid walking on wet floors. Keep items that you use a lot in easy-to-reach places. If you need to reach something above you, use a strong step stool that has a grab bar. Keep electrical cords out of the way. Do not use floor polish or wax that makes floors slippery. If you must use wax, use non-skid floor wax. Do not have throw rugs and other things on the floor that can make you trip. What can I do with my stairs? Do not leave any items on the stairs. Make sure that there are handrails on both sides of the stairs and use them. Fix handrails that are broken or loose. Make sure that handrails are as long as the stairways. Check any carpeting to make sure that it is firmly attached to the stairs. Fix any carpet that is loose or worn. Avoid having throw rugs at the top or bottom of the stairs. If you do have throw rugs, attach them to the floor with carpet tape. Make sure that you have a light switch at the top of the stairs and the bottom of the stairs. If you do not have them, ask someone to add them for you. What else can I do to help prevent  falls? Wear shoes that: Do not have high heels. Have rubber bottoms. Are comfortable and fit you well. Are closed at the toe. Do not wear sandals. If you use a stepladder: Make sure that it is fully opened. Do not climb a closed stepladder. Make sure that both sides of the stepladder are locked into place. Ask someone to hold it for you, if possible. Clearly mark and make sure that you can see: Any grab bars or handrails. First and last steps. Where the edge of each step is. Use tools that help you move around (mobility aids) if they are needed. These include: Canes. Walkers. Scooters. Crutches. Turn on the lights when you go into a dark area. Replace any light bulbs as soon as they burn out. Set up your furniture so you have a clear path. Avoid moving your furniture around. If any of your floors are uneven, fix them. If there are any pets around you, be aware of where they are. Review your medicines with your doctor. Some medicines can make you feel dizzy. This can increase your chance of falling. Ask your doctor what other things that you can do to help prevent falls. This information is not intended to replace advice given to you by your health care provider. Make sure you discuss any questions you have with your health care provider. Document Released: 06/19/2009 Document Revised: 01/29/2016 Document Reviewed: 09/27/2014 Elsevier Interactive Patient Education  2017 [employer].

## 2022-07-13 NOTE — Progress Notes (Signed)
I connected with  [name] on 07/13/22 by a audio enabled telemedicine application and verified that I am speaking with the correct person using two identifiers.  Patient Location: Home  Provider Location: Office/Clinic  I discussed the limitations of evaluation and management by telemedicine. The patient expressed understanding and agreed to proceed.   Subjective:   [name] is a 66 y.o. male who presents for Medicare Annual/Subsequent preventive examination.  Review of Systems     Cardiac Risk Factors include: advanced age (>5mn, >>30women);hypertension;diabetes mellitus;dyslipidemia;male gender;obesity (BMI >30kg/m2)     Objective:    Today's Vitals   07/13/22 1447  Weight: 222 lb (100.7 kg)   Body mass index is 33.75 kg/m.     07/13/2022    2:52 PM 07/07/2021   11:15 AM 06/05/2021    2:50 PM 12/20/2017    1:20 PM 04/25/2017   10:44 AM 04/18/2017    8:27 AM 02/24/2016    7:57 PM  Advanced Directives  Does Patient Have a Medical Advance Directive? _0  No No  Would patient like information on creating a medical advance directive? No - Patient declined No - Patient declined No - Patient declined No - Patient declined   No - patient declined information    Current Medications (verified) Outpatient Encounter Medications as of 07/13/2022  Medication Sig   amLODipine (NORVASC) 10 MG tablet Take 10 mg by mouth daily. [facility]  aspirin EC 81 MG tablet Take 1 tablet (81 mg total) by mouth daily.   atorvastatin (LIPITOR) 80 MG tablet Take 1 tablet (80 mg total) by mouth daily at 6 PM.   blood glucose meter kit and supplies KIT Dispense based on patient and insurance preference. Use up to four times daily as directed.   chlorthalidone (HYGROTON) 25 MG tablet Take 1 tablet (25 mg total) by mouth daily.   cholecalciferol (VITAMIN D) 1000 units tablet Take 3 tablets (3,000 Units total) by mouth daily.   diltiazem (TIAZAC) 180 MG 24 hr capsule Take 2  capsules (360 mg total) by mouth daily.   ezetimibe (ZETIA) 10 MG tablet Take 1 tablet by mouth once daily   levothyroxine (SYNTHROID, LEVOTHROID) 75 MCG tablet Take 1 tablet (75 mcg total) by mouth daily before breakfast.   magnesium oxide (MAG-OX) 400 MG tablet Take 1.5 tablets (600 mg total) by mouth 2 (two) times daily.   metFORMIN (GLUCOPHAGE) 1000 MG tablet TAKE 1 TABLET BY MOUTH TWICE DAILY WITH MEALS   mycophenolate (CELLCEPT) 500 MG tablet Take 1 tablet (500 mg total) by mouth 2 (two) times daily.   tacrolimus (PROGRAF) 0.5 MG capsule Take 2 capsules (1 mg total) by mouth 2 (two) times daily. (Patient taking differently: Take by mouth 2 (two) times daily. 2 tablets in the AM 1 tablet in the PM)   tamsulosin (FLOMAX) 0.4 MG CAPS capsule Take 0.4 mg by mouth.   valsartan (DIOVAN) 160 MG tablet Take 320 mg by mouth daily.    famotidine (PEPCID) 20 MG tablet Take 20 mg by mouth in the morning and at bedtime.   furosemide (LASIX) 20 MG tablet Take 1 tablet (20 mg total) by mouth 2 (two) times daily for 3 days. (Patient not taking: Reported on 06/30/2022)   [DISCONTINUED] mupirocin ointment (BACTROBAN) 2 % Apply 1 Application topically 2 (two) times daily.   No facility-administered encounter medications on file as of 07/13/2022.    Allergies (verified) Patient has no known allergies.  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   on old heart   Blood transfusion without reported diagnosis    CHF (congestive heart failure) ([facility])    CORONARY ARTERY DISEASE 04/25/2007   DIABETES MELLITUS, TYPE II 12/13/2008   no meds, diet contolled   GERD (gastroesophageal reflux disease)    HYPERLIPIDEMIA 04/25/2007   Hypertension    HYPERTENSION, HX OF 05/14/2008   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    MYOCARDIAL INFARCTION, 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   OBESITY 02/26/2009   Thyroid disease    THYROID DISEASE, HX OF 05/14/2008   VENTRICULAR TACHYCARDIA 02/26/2009   Past Surgical History:  Procedure Laterality Date   ANGIOPLASTY     with stent   CARDIAC ASSIST DEVICE REMOVAL     CARDIAC CATHETERIZATION     COLONOSCOPY     CORONARY ARTERY BYPASS GRAFT     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   LEFT VENTRICULAR ASSIST DEVICE  05/2009   [facility]   Family History  Problem Relation Age of Onset   Heart disease Mother    Lung cancer Maternal Grandmother    Lung cancer Maternal Grandfather    Colon cancer Neg Hx    Esophageal cancer Neg Hx    Prostate cancer Neg Hx    Rectal cancer Neg Hx    Stomach cancer Neg Hx    Social History   Socioeconomic History   Marital status: Divorced    Spouse name: Not on file   Number of children: 1   Years of education: Not on file   Highest education level: Not on file  Occupational History   Not on file  Tobacco Use   Smoking status: Former    Packs/day: 1.00    Years: 20.00    Total pack years: 20.00    Types: Cigarettes    Quit date: 07/02/1999    Years since quitting: 23.0   Smokeless tobacco: Never  Vaping Use   Vaping Use: Never used  Substance and Sexual Activity   Alcohol use: No    Comment: once yearly   Drug use: No   Sexual activity: Not on file  Other Topics Concern   Not on file  Social History Narrative   Single. Divorced. 1 son. 1 granddaughter 2014.       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       Hobbies: time on CPU            Social Determinants of Health   Financial Resource Strain: Low Risk  (07/13/2022)   Overall Financial Resource Strain (CARDIA)    Difficulty of Paying Living Expenses: Not hard at all  Food Insecurity: No Food Insecurity (07/13/2022)   Hunger Vital Sign    Worried About Running Out of Food in the Last Year: Never true    Ran Out of Food in the Last Year: Never true   Transportation Needs: No Transportation Needs (07/13/2022)   PRAPARE - Hydrologist (Medical): No    Lack of Transportation (Non-Medical): No  Physical Activity: Inactive (07/13/2022)   Exercise Vital Sign    Days of Exercise per Week: 0 days    Minutes of Exercise per Session: 0 min  Stress: No Stress Concern Present (06/05/2021)   [place] -  Occupational Stress Questionnaire    Feeling of Stress : Not at all  Social Connections: Moderately Isolated (07/13/2022)   Social Connection and Isolation Panel [NHANES]    Frequency of Communication with Friends and Family: Twice a week    Frequency of Social Gatherings with Friends and Family: Twice a week    Attends Religious Services: More than 4 times per year    Active Member of [employer] or Organizations: No    Attends Music therapist: Never    Marital Status: Divorced    Tobacco Counseling Counseling given: Not Answered   Clinical Intake:  Pre-visit preparation completed: Yes  Pain : No/denies pain     BMI - recorded: 33.75 Nutritional Status: BMI > 30  Obese Nutritional Risks: None Diabetes: Yes CBG done?: Yes (121 per pt) CBG resulted in Enter/ Edit results?: No Did pt. bring in CBG monitor from home?: No  How often do you need to have someone help you when you read instructions, pamphlets, or other written materials from your doctor or pharmacy?: 1 - Never  Diabetic?Nutrition Risk Assessment:  Has the patient had any N/V/D within the last 2 months?  No  Does the patient have any non-healing wounds?  No  Has the patient had any unintentional weight loss or weight gain?  No   Diabetes:  Is the patient diabetic?  Yes  If diabetic, was a CBG obtained today?  Yes  Did the patient bring in their glucometer from home?  No  How often do you monitor your CBG's? Daily .   Financial Strains and Diabetes Management:  Are you having any financial strains  with the device, your supplies or your medication? No .  Does the patient want to be seen by Chronic Care Management for management of their diabetes?  No  Would the patient like to be referred to a Nutritionist or for Diabetic Management?  No   Diabetic Exams:  Diabetic Eye Exam: Patient had eye exam on stated this year , results pending. Diabetic Foot Exam: Overdue, Pt has been advised about the importance in completing this exam. Pt is scheduled for diabetic foot exam on next appt .   Interpreter Needed?: No  Information entered by :: Charlott Rakes, LPN   Activities of Daily Living    07/13/2022    2:53 PM  In your present state of health, do you have any difficulty performing the following activities:  Hearing? 1  Comment wears a hearing aids  Vision? 0  Difficulty concentrating or making decisions? 0  Walking or climbing stairs? 0  Dressing or bathing? 0  Doing errands, shopping? 0  Preparing Food and eating ? N  Using the Toilet? N  In the past six months, have you accidently leaked urine? N  Do you have problems with loss of bowel control? N  Managing your Medications? N  Managing your Finances? N  Housekeeping or managing your Housekeeping? N    Patient Care Team: Marin Olp, MD as PCP - General (Family Medicine) Venida Jarvis, MD as Consulting Physician (Cardiology) Edythe Clarity, [facility] (Pharmacist)  Indicate any recent Medical Services you may have received from other than [facility] providers in the past year (date may be approximate).     Assessment:   This is a routine wellness examination for [name].  Hearing/Vision screen Hearing Screening - Comments:: Pt wears a hearing aids  Vision Screening - Comments:: Pt follows up with Dr Vivia Budge for annual eye exams  Dietary issues and exercise activities discussed: Current Exercise Habits: The patient does not participate in regular exercise at present   Goals Addressed             This  Visit's Progress    Patient Stated       Lose a little more weight        Depression Screen    07/13/2022    2:50 PM 06/30/2022   11:02 AM 06/05/2021    2:50 PM 06/05/2021    2:44 PM 12/17/2020   10:40 AM 04/14/2020   10:52 AM 02/23/2019   10:55 AM  PHQ 2/9 Scores  PHQ - 2 Score 0 0 0 0 0 0 0  PHQ- 9 Score     _0 Fall Risk    07/13/2022    2:53 PM 06/05/2021    2:50 PM 12/17/2020   10:36 AM 12/20/2017    1:21 PM 03/17/2016   12:37 PM  Fall Risk   Falls in the past year? 0 1 1 No No  Number falls in past yr: 0 0 1    Injury with Fall? 0 0 0    Risk for fall due to : No Fall Risks No Fall Risks     Follow up Falls prevention discussed        FALL RISK PREVENTION PERTAINING TO THE HOME:  Any stairs in or around the home? Yes  If so, are there any without handrails? No  Home free of loose throw rugs in walkways, pet beds, electrical cords, etc? Yes  Adequate lighting in your home to reduce risk of falls? Yes   ASSISTIVE DEVICES UTILIZED TO PREVENT FALLS:  Life alert? No  Use of a cane, walker or w/c? No  Grab bars in the bathroom? No  Shower chair or bench in shower? No  Elevated toilet seat or a handicapped toilet? No   TIMED UP AND GO:  Was the test performed? No .  Cognitive Function:        07/13/2022    2:54 PM 06/05/2021    2:51 PM  6CIT Screen  What Year? 0 points 0 points  What month? 0 points 0 points  What time? 0 points 0 points  Count back from 20 0 points 0 points  Months in reverse 0 points 0 points  Repeat phrase 0 points 0 points  Total Score 0 points 0 points    Immunizations Immunization History  Administered Date(s) Administered   Fluad Quad(high Dose 65+) 06/30/2022   HIB (PRP-OMP) 05/02/2009   Hepatitis A, Adult 06/09/2012   Hepatitis B 09/14/2011   Hepatitis B, adult 05/08/2009, 09/14/2011, 06/09/2012   Influenza Split 07/02/2011   Influenza Whole 06/25/2008   Influenza, Seasonal, Injecte, Preservative Fre 08/06/2010,  06/09/2012   Influenza,inj,Quad PF,6+ Mos 08/07/2013, 06/19/2014, 08/29/2015, 08/20/2016, 06/07/2018, 05/22/2019, 06/16/2020   Influenza-Unspecified 06/19/2014, 06/16/2020   PFIZER(Purple Top)SARS-COV-2 Vaccination 11/04/2019, 12/01/2019, 10/22/2020   PPD Test 05/04/2009, 09/14/2011, 06/09/2012   Pfizer Covid-19 Vaccine Bivalent Booster 69yr & up 06/16/2022   Pneumococcal Conjugate-13 07/18/2012   Pneumococcal Polysaccharide-23 09/06/2009, 06/09/2012, 02/23/2019   Pneumococcal-Unspecified 05/02/2009   Td 05/02/2009   Tdap 09/14/2011   Zoster, Live 12/16/2006    TDAP status: Up to date  Flu Vaccine status: Up to date  Pneumococcal vaccine status: Up to date  Covid-19 vaccine status: Completed vaccines  Qualifies for Shingles Vaccine? Yes   Zostavax completed No   Shingrix Completed?: No.    Education  has been provided regarding the importance of this vaccine. Patient has been advised to call insurance company to determine out of pocket expense if they have not yet received this vaccine. Advised may also receive vaccine at local pharmacy or Health Dept. Verbalized acceptance and understanding.  Screening Tests Health Maintenance  Topic Date Due   Diabetic kidney evaluation - Urine ACR  Never done   Zoster Vaccines- Shingrix (1 of 2) Never done   OPHTHALMOLOGY EXAM  06/20/2021   FOOT EXAM  06/18/2022   HIV Screening  01/08/2064 (Originally 12/16/1970)   COVID-19 Vaccine (5 - Pfizer risk series) 08/11/2022   HEMOGLOBIN A1C  12/30/2022   Diabetic kidney evaluation - GFR measurement  03/31/2023   Medicare Annual Wellness (AWV)  07/14/2023   Pneumonia Vaccine 54+ Years old (67 - PPSV23 or PCV20) 02/23/2024   COLONOSCOPY (Pts 45-63yr Insurance coverage will need to be confirmed)  04/26/2027   TETANUS/TDAP  05/12/2032   INFLUENZA VACCINE  Completed   Hepatitis C Screening  Addressed   HPV VACCINES  Aged Out    Health Maintenance  Health Maintenance Due  Topic Date Due    Diabetic kidney evaluation - Urine ACR  Never done   Zoster Vaccines- Shingrix (1 of 2) Never done   OPHTHALMOLOGY EXAM  06/20/2021   FOOT EXAM  06/18/2022    Colorectal cancer screening: Type of screening: Colonoscopy. Completed 04/25/17. Repeat every 10 years   Additional Screening:  Hepatitis C Screening:  Completed 08/29/15  Vision Screening: Recommended annual ophthalmology exams for early detection of glaucoma and other disorders of the eye. Is the patient up to date with their annual eye exam?  Yes  Who is the provider or what is the name of the office in which the patient attends annual eye exams? Dr  MVivia Budge If pt is not established with a provider, would they like to be referred to a provider to establish care? No .   Dental Screening: Recommended annual dental exams for proper oral hygiene  Community Resource Referral / Chronic Care Management: CRR required this visit?  No   CCM required this visit?  No      Plan:     I have personally reviewed and noted the following in the patient's chart:   Medical and social history Use of alcohol, tobacco or illicit drugs  Current medications and supplements including opioid prescriptions. Patient is not currently taking opioid prescriptions. Functional ability and status Nutritional status Physical activity Advanced directives List of other physicians Hospitalizations, surgeries, and ER visits in previous 12 months Vitals Screenings to include cognitive, depression, and falls Referrals and appointments  In addition, I have reviewed and discussed with patient certain preventive protocols, quality metrics, and best practice recommendations. A written personalized care plan for preventive services as well as general preventive health recommendations were provided to patient.     TWillette Brace LPN   182/01/36  Nurse Notes: none

## 2022-07-14 ENCOUNTER — Ambulatory Visit: Payer: Medicare HMO | Admitting: Sports Medicine

## 2022-07-15 DIAGNOSIS — M25512 Pain in left shoulder: Secondary | ICD-10-CM | POA: Diagnosis not present

## 2022-07-15 DIAGNOSIS — M7542 Impingement syndrome of left shoulder: Secondary | ICD-10-CM | POA: Diagnosis not present

## 2022-07-20 DIAGNOSIS — M25512 Pain in left shoulder: Secondary | ICD-10-CM | POA: Diagnosis not present

## 2022-07-20 DIAGNOSIS — M7542 Impingement syndrome of left shoulder: Secondary | ICD-10-CM | POA: Diagnosis not present

## 2022-07-23 DIAGNOSIS — M7542 Impingement syndrome of left shoulder: Secondary | ICD-10-CM | POA: Diagnosis not present

## 2022-07-23 DIAGNOSIS — M25512 Pain in left shoulder: Secondary | ICD-10-CM | POA: Diagnosis not present

## 2022-07-26 DIAGNOSIS — M7542 Impingement syndrome of left shoulder: Secondary | ICD-10-CM | POA: Diagnosis not present

## 2022-07-26 DIAGNOSIS — M25512 Pain in left shoulder: Secondary | ICD-10-CM | POA: Diagnosis not present

## 2022-07-27 ENCOUNTER — Telehealth: Payer: Self-pay | Admitting: Pharmacist

## 2022-07-27 NOTE — Progress Notes (Signed)
Chronic Care Management Pharmacy Assistant   Name: [name]  MRN: [id number] DOB: 1956-01-11   Reason for Encounter: Diabetes Adherence Call    Recent office visits:  06/30/2022 OV (PCP) Marin Olp, MD; no medication changes indicated.  Recent consult visits:  07/05/2022 OV (Sports Medicine) Glennon Mac, DO; no medication changes noted.  07/01/2022 OV (Gen Surgery) London Sheer, MD; no medication changes noted.  Hospital visits:  None in previous 6 months  Medications: Outpatient Encounter Medications as of 07/27/2022  Medication Sig   amLODipine (NORVASC) 10 MG tablet Take 10 mg by mouth daily. Per [facility]   aspirin EC 81 MG tablet Take 1 tablet (81 mg total) by mouth daily.   atorvastatin (LIPITOR) 80 MG tablet Take 1 tablet (80 mg total) by mouth daily at 6 PM.   blood glucose meter kit and supplies KIT Dispense based on patient and insurance preference. Use up to four times daily as directed.   chlorthalidone (HYGROTON) 25 MG tablet Take 1 tablet (25 mg total) by mouth daily.   cholecalciferol (VITAMIN D) 1000 units tablet Take 3 tablets (3,000 Units total) by mouth daily.   diltiazem (TIAZAC) 180 MG 24 hr capsule Take 2 capsules (360 mg total) by mouth daily.   ezetimibe (ZETIA) 10 MG tablet Take 1 tablet by mouth once daily   famotidine (PEPCID) 20 MG tablet Take 20 mg by mouth in the morning and at bedtime.   furosemide (LASIX) 20 MG tablet Take 1 tablet (20 mg total) by mouth 2 (two) times daily for 3 days. (Patient not taking: Reported on 06/30/2022)   levothyroxine (SYNTHROID, LEVOTHROID) 75 MCG tablet Take 1 tablet (75 mcg total) by mouth daily before breakfast.   magnesium oxide (MAG-OX) 400 MG tablet Take 1.5 tablets (600 mg total) by mouth 2 (two) times daily.   metFORMIN (GLUCOPHAGE) 1000 MG tablet TAKE 1 TABLET BY MOUTH TWICE DAILY WITH MEALS   mycophenolate (CELLCEPT) 500 MG tablet Take 1 tablet (500 mg total) by mouth 2 (two) times  daily.   tacrolimus (PROGRAF) 0.5 MG capsule Take 2 capsules (1 mg total) by mouth 2 (two) times daily. (Patient taking differently: Take by mouth 2 (two) times daily. 2 tablets in the AM 1 tablet in the PM)   tamsulosin (FLOMAX) 0.4 MG CAPS capsule Take 0.4 mg by mouth.   valsartan (DIOVAN) 160 MG tablet Take 320 mg by mouth daily.    No facility-administered encounter medications on file as of 07/27/2022.   Recent Relevant Labs: Lab Results  Component Value Date/Time   HGBA1C 6.0 (A) 06/30/2022 10:57 AM   HGBA1C 6.4 12/10/2021 12:00 AM   HGBA1C 6.0 04/24/2021 12:00 AM    Kidney Function Lab Results  Component Value Date/Time   CREATININE 1.37 03/30/2022 11:40 AM   CREATININE 1.4 (A) 12/14/2021 12:00 AM   CREATININE 1.6 (A) 04/24/2021 12:00 AM   CREATININE 1.35 12/17/2020 11:33 AM   GFR 54.22 (L) 03/30/2022 11:40 AM   GFRNONAA 48 10/22/2020 12:00 AM   GFRAA 56 10/22/2020 12:00 AM    Current antihyperglycemic regimen:  Metformin 1000 mg twice daily with meals  What recent interventions/DTPs have been made to improve glycemic control:  No recent interventions or DTPs.  Have there been any recent hospitalizations or ED visits since last visit with CPP? No  Patient denies hypoglycemic symptoms.  Patient denies hyperglycemic symptoms.  How often are you checking your blood sugar? once daily  What are your blood sugars  ranging?  Fasting: 120  During the week, how often does your blood glucose drop below 70? Never  Are you checking your feet daily/regularly? Yes  Adherence Review: Is the patient currently on a STATIN medication? Yes Is the patient currently on ACE/ARB medication? Yes Does the patient have >5 day gap between last estimated fill dates? No  Care Gaps: Medicare Annual Wellness: Completed 11/0/2023 Ophthalmology Exam: Overdue since 06/20/2021 Foot Exam: Overdue since 06/18/2022 GFR measurement: Abnormal on 03/30/2022 Urine ACR: Ordered on  06/30/2022 Hemoglobin A1C: 6.0% on 06/30/2022 Colonoscopy: Completed 04/25/2017  Future Appointments  Date Time Provider [place]  08/04/2022 10:15 AM Glennon Mac, DO [facility] None  09/28/2022  3:30 PM [facility] CCM PHARMACIST [facility]  01/06/2023 10:00 AM Marin Olp, MD [facility]   Star Rating Drugs: Atorvastatin 80 mg last filled 03/16/2022 90 DS - patient states he has not ran out of this medication. He states he takes this daily. Last filled date is correct per patient. Metformin 1000 mg last filled 05/04/2022 90 DS Valsartan 160 mg last filled 05/08/2022 90 DS  April D Calhoun, [place] Pharmacist Assistant [phone]

## 2022-07-28 DIAGNOSIS — M7542 Impingement syndrome of left shoulder: Secondary | ICD-10-CM | POA: Diagnosis not present

## 2022-07-28 DIAGNOSIS — M25512 Pain in left shoulder: Secondary | ICD-10-CM | POA: Diagnosis not present

## 2022-08-01 ENCOUNTER — Other Ambulatory Visit: Payer: Self-pay | Admitting: Family Medicine

## 2022-08-01 DIAGNOSIS — R7309 Other abnormal glucose: Secondary | ICD-10-CM

## 2022-08-02 DIAGNOSIS — D225 Melanocytic nevi of trunk: Secondary | ICD-10-CM | POA: Diagnosis not present

## 2022-08-02 DIAGNOSIS — M7542 Impingement syndrome of left shoulder: Secondary | ICD-10-CM | POA: Diagnosis not present

## 2022-08-02 DIAGNOSIS — Z1283 Encounter for screening for malignant neoplasm of skin: Secondary | ICD-10-CM | POA: Diagnosis not present

## 2022-08-02 DIAGNOSIS — M25512 Pain in left shoulder: Secondary | ICD-10-CM | POA: Diagnosis not present

## 2022-08-03 ENCOUNTER — Other Ambulatory Visit: Payer: Self-pay | Admitting: Family Medicine

## 2022-08-03 ENCOUNTER — Ambulatory Visit: Payer: Medicare HMO | Admitting: Sports Medicine

## 2022-08-03 NOTE — Progress Notes (Unsigned)
Benito Mccreedy D.Tracy Blanchard Phone: [phone]   Assessment and Plan:     There are no diagnoses linked to this encounter.  ***   Pertinent previous records reviewed include ***   Follow Up: ***     Subjective:   I, Sotirios Navarro, am serving as a Education administrator for Doctor Glennon Mac   Chief Complaint: left shoulder pain    HPI:    07/05/2022 Patient is a 66 year old male complaining of left shoulder pain. Patient states since the end of June he has been in pain , he was reaching behind him and he felt immediate pain , decreased ROM , some numbness or tingling, no meds , pain radiates down the elbow up the neck and to the shoulder blade , sometimes pain down to hs fingers   08/04/2022 Patient states    Relevant Historical Information: GERD, DM type II, hypothyroidism, history of heart transplant  Additional pertinent review of systems negative.   Current Outpatient Medications:    ACCU-CHEK GUIDE test strip, USE 1  4 TIMES DAILY, Disp: 100 each, Rfl: 0   amLODipine (NORVASC) 10 MG tablet, Take 10 mg by mouth daily. Per [facility], Disp: , Rfl:    aspirin EC 81 MG tablet, Take 1 tablet (81 mg total) by mouth daily., Disp: 30 tablet, Rfl: 0   atorvastatin (LIPITOR) 80 MG tablet, Take 1 tablet (80 mg total) by mouth daily at 6 PM., Disp: 30 tablet, Rfl: 0   blood glucose meter kit and supplies KIT, Dispense based on patient and insurance preference. Use up to four times daily as directed., Disp: 1 each, Rfl: 0   chlorthalidone (HYGROTON) 25 MG tablet, Take 1 tablet (25 mg total) by mouth daily., Disp: 30 tablet, Rfl: 0   cholecalciferol (VITAMIN D) 1000 units tablet, Take 3 tablets (3,000 Units total) by mouth daily., Disp: 90 tablet, Rfl: 0   diltiazem (TIAZAC) 180 MG 24 hr capsule, Take 2 capsules (360 mg total) by mouth daily., Disp: 60 capsule, Rfl: 0   ezetimibe (ZETIA) 10 MG tablet, Take 1 tablet by  mouth once daily, Disp: 90 tablet, Rfl: 0   famotidine (PEPCID) 20 MG tablet, Take 20 mg by mouth in the morning and at bedtime., Disp: , Rfl:    furosemide (LASIX) 20 MG tablet, Take 1 tablet (20 mg total) by mouth 2 (two) times daily for 3 days. (Patient not taking: Reported on 06/30/2022), Disp: 6 tablet, Rfl: 0   levothyroxine (SYNTHROID, LEVOTHROID) 75 MCG tablet, Take 1 tablet (75 mcg total) by mouth daily before breakfast., Disp: 30 tablet, Rfl: 0   magnesium oxide (MAG-OX) 400 MG tablet, Take 1.5 tablets (600 mg total) by mouth 2 (two) times daily., Disp: 45 tablet, Rfl: 0   metFORMIN (GLUCOPHAGE) 1000 MG tablet, TAKE 1 TABLET BY MOUTH TWICE DAILY WITH MEALS, Disp: 180 tablet, Rfl: 0   mycophenolate (CELLCEPT) 500 MG tablet, Take 1 tablet (500 mg total) by mouth 2 (two) times daily., Disp: 60 tablet, Rfl: 0   tacrolimus (PROGRAF) 0.5 MG capsule, Take 2 capsules (1 mg total) by mouth 2 (two) times daily. (Patient taking differently: Take by mouth 2 (two) times daily. 2 tablets in the AM 1 tablet in the PM), Disp: 120 capsule, Rfl: 0   tamsulosin (FLOMAX) 0.4 MG CAPS capsule, Take 0.4 mg by mouth., Disp: , Rfl:    valsartan (DIOVAN) 160 MG tablet, Take 320 mg by  mouth daily. , Disp: , Rfl:    Objective:     There were no vitals filed for this visit.    There is no height or weight on file to calculate BMI.    Physical Exam:    ***   Electronically signed by:  Benito Mccreedy D.[facility] 3:43 PM 08/03/22

## 2022-08-04 ENCOUNTER — Ambulatory Visit: Payer: Medicare HMO | Admitting: Sports Medicine

## 2022-08-04 ENCOUNTER — Ambulatory Visit: Payer: Self-pay

## 2022-08-04 VITALS — BP 130/78 | HR 95 | Ht 68.0 in | Wt 223.0 lb

## 2022-08-04 DIAGNOSIS — G8929 Other chronic pain: Secondary | ICD-10-CM

## 2022-08-04 DIAGNOSIS — M25512 Pain in left shoulder: Secondary | ICD-10-CM | POA: Diagnosis not present

## 2022-08-04 DIAGNOSIS — M7582 Other shoulder lesions, left shoulder: Secondary | ICD-10-CM

## 2022-08-04 DIAGNOSIS — E11618 Type 2 diabetes mellitus with other diabetic arthropathy: Secondary | ICD-10-CM

## 2022-08-04 DIAGNOSIS — M7500 Adhesive capsulitis of unspecified shoulder: Secondary | ICD-10-CM | POA: Diagnosis not present

## 2022-08-04 DIAGNOSIS — M19012 Primary osteoarthritis, left shoulder: Secondary | ICD-10-CM

## 2022-08-04 NOTE — Patient Instructions (Addendum)
Good to see you  Continue PT  3-4 week follow up

## 2022-08-05 MED ORDER — ASPIRIN 81 MG TABLET,DELAYED RELEASE
ORAL_TABLET | Freq: Every day | ORAL | 3 refills | 90 days | Status: CP
Start: 2022-08-05 — End: ?
  Filled 2022-08-09: qty 90, 90d supply, fill #0

## 2022-08-05 MED ORDER — AMLODIPINE 10 MG TABLET
ORAL_TABLET | Freq: Every day | ORAL | 3 refills | 90 days | Status: CP
Start: 2022-08-05 — End: 2023-08-05
  Filled 2022-08-09: qty 90, 90d supply, fill #0

## 2022-08-05 MED ORDER — FAMOTIDINE 20 MG TABLET
ORAL_TABLET | Freq: Two times a day (BID) | ORAL | 3 refills | 90 days | Status: CP
Start: 2022-08-05 — End: 2023-08-05
  Filled 2022-08-09: qty 180, 90d supply, fill #0

## 2022-08-05 NOTE — Unmapped (Signed)
[facility] Refill Coordination Note    Specialty Medication(s) to be Shipped:   Transplant: mycophenolate mofetil 500mg  and tacrolimus 0.5mg     Other medication(s) to be shipped:  amlodipine , aspirin , atorvastatin , chlorthalidone ,  diltiazem , famotidine , levothyroxine and valsartan     Andrew Dickerson, DOB: 02-10-1956  Phone: There are no phone numbers on file.      All above HIPAA information was verified with patient.     Was a Nurse, learning disability used for this call? No    Completed refill call assessment today to schedule patient's medication shipment from the [facility] [phone]).  All relevant notes have been reviewed.     Specialty medication(s) and dose(s) confirmed: Regimen is correct and unchanged.   Changes to medications: Azarias reports no changes at this time.  Changes to insurance: No  New side effects reported not previously addressed with a pharmacist or physician: None reported  Questions for the pharmacist: No    Confirmed patient received a Conservation officer, historic buildings and a Surveyor, mining with first shipment. The patient will receive a drug information handout for each medication shipped and additional FDA Medication Guides as required.       DISEASE/MEDICATION-SPECIFIC INFORMATION        N/A    SPECIALTY MEDICATION ADHERENCE     Medication Adherence    Patient reported X missed doses in the last month: 1  Specialty Medication: tacrolimus 0.5 mg  Patient is on additional specialty medications: Yes  Additional Specialty Medications: mycophenolate 500 mg tablet (CELLCEPT)  Patient Reported Additional Medication X Missed Doses in the Last Month: 1  Patient is on more than two specialty medications: No                                Were doses missed due to medication being on hold? No    mycophenolate 500 mg: 8 days of medicine on hand   tacrolimus 0.5 mg: 8 days of medicine on hand        REFERRAL TO PHARMACIST     Referral to the pharmacist: Not needed      [address]     Shipping address confirmed in Epic.     Delivery Scheduled: Yes, Expected medication delivery date: 08/10/22.     Medication will be delivered via UPS to the prescription address in Epic WAM.    Quintella Reichert   [facility] Specialty Technician

## 2022-08-09 DIAGNOSIS — M7542 Impingement syndrome of left shoulder: Secondary | ICD-10-CM | POA: Diagnosis not present

## 2022-08-09 DIAGNOSIS — M25512 Pain in left shoulder: Secondary | ICD-10-CM | POA: Diagnosis not present

## 2022-08-09 MED FILL — TACROLIMUS 0.5 MG CAPSULE, IMMEDIATE-RELEASE: ORAL | 30 days supply | Qty: 90 | Fill #5

## 2022-08-09 MED FILL — VALSARTAN 160 MG TABLET: ORAL | 90 days supply | Qty: 180 | Fill #3

## 2022-08-09 MED FILL — LEVOTHYROXINE 75 MCG TABLET: ORAL | 30 days supply | Qty: 30 | Fill #1

## 2022-08-09 MED FILL — ATORVASTATIN 80 MG TABLET: ORAL | 90 days supply | Qty: 90 | Fill #2

## 2022-08-09 MED FILL — DILTIAZEM CD 180 MG CAPSULE,EXTENDED RELEASE 24 HR: ORAL | 30 days supply | Qty: 60 | Fill #1

## 2022-08-09 MED FILL — CHLORTHALIDONE 25 MG TABLET: ORAL | 30 days supply | Qty: 30 | Fill #1

## 2022-08-11 DIAGNOSIS — M7542 Impingement syndrome of left shoulder: Secondary | ICD-10-CM | POA: Diagnosis not present

## 2022-08-11 DIAGNOSIS — M25512 Pain in left shoulder: Secondary | ICD-10-CM | POA: Diagnosis not present

## 2022-08-23 DIAGNOSIS — M7542 Impingement syndrome of left shoulder: Secondary | ICD-10-CM | POA: Diagnosis not present

## 2022-08-23 DIAGNOSIS — M25512 Pain in left shoulder: Secondary | ICD-10-CM | POA: Diagnosis not present

## 2022-08-24 NOTE — Progress Notes (Unsigned)
[name] D.[place] Phone: [phone]   Assessment and Plan:     There are no diagnoses linked to this encounter.  ***   Pertinent previous records reviewed include ***   Follow Up: ***     Subjective:   I, Kylie Gros, am serving as a Education administrator for Doctor Glennon Mac   Chief Complaint: left shoulder pain    HPI:    07/05/2022 Patient is a 66 year old male complaining of left shoulder pain. Patient states since the end of June he has been in pain , he was reaching behind him and he felt immediate pain , decreased ROM , some numbness or tingling, no meds , pain radiates down the elbow up the neck and to the shoulder blade , sometimes pain down to hs fingers    08/04/2022 Patient states he is doing just fine    08/25/2022 Patient states    Relevant Historical Information: GERD, DM type II, hypothyroidism, history of heart transplant Additional pertinent review of systems negative.   Current Outpatient Medications:    ACCU-CHEK GUIDE test strip, USE 1  4 TIMES DAILY, Disp: 100 each, Rfl: 0   amLODipine (NORVASC) 10 MG tablet, Take 10 mg by mouth daily. Per [facility], Disp: , Rfl:    aspirin EC 81 MG tablet, Take 1 tablet (81 mg total) by mouth daily., Disp: 30 tablet, Rfl: 0   atorvastatin (LIPITOR) 80 MG tablet, Take 1 tablet (80 mg total) by mouth daily at 6 PM., Disp: 30 tablet, Rfl: 0   blood glucose meter kit and supplies KIT, Dispense based on patient and insurance preference. Use up to four times daily as directed., Disp: 1 each, Rfl: 0   chlorthalidone (HYGROTON) 25 MG tablet, Take 1 tablet (25 mg total) by mouth daily., Disp: 30 tablet, Rfl: 0   cholecalciferol (VITAMIN D) 1000 units tablet, Take 3 tablets (3,000 Units total) by mouth daily., Disp: 90 tablet, Rfl: 0   diltiazem (TIAZAC) 180 MG 24 hr capsule, Take 2 capsules (360 mg total) by mouth daily., Disp: 60 capsule, Rfl: 0    ezetimibe (ZETIA) 10 MG tablet, Take 1 tablet by mouth once daily, Disp: 90 tablet, Rfl: 0   famotidine (PEPCID) 20 MG tablet, Take 20 mg by mouth in the morning and at bedtime., Disp: , Rfl:    furosemide (LASIX) 20 MG tablet, Take 1 tablet (20 mg total) by mouth 2 (two) times daily for 3 days. (Patient not taking: Reported on 06/30/2022), Disp: 6 tablet, Rfl: 0   levothyroxine (SYNTHROID, LEVOTHROID) 75 MCG tablet, Take 1 tablet (75 mcg total) by mouth daily before breakfast., Disp: 30 tablet, Rfl: 0   magnesium oxide (MAG-OX) 400 MG tablet, Take 1.5 tablets (600 mg total) by mouth 2 (two) times daily., Disp: 45 tablet, Rfl: 0   metFORMIN (GLUCOPHAGE) 1000 MG tablet, TAKE 1 TABLET BY MOUTH TWICE DAILY WITH MEALS, Disp: 180 tablet, Rfl: 0   mycophenolate (CELLCEPT) 500 MG tablet, Take 1 tablet (500 mg total) by mouth 2 (two) times daily., Disp: 60 tablet, Rfl: 0   tacrolimus (PROGRAF) 0.5 MG capsule, Take 2 capsules (1 mg total) by mouth 2 (two) times daily. (Patient taking differently: Take by mouth 2 (two) times daily. 2 tablets in the AM 1 tablet in the PM), Disp: 120 capsule, Rfl: 0   tamsulosin (FLOMAX) 0.4 MG CAPS capsule, Take 0.4 mg by mouth., Disp: , Rfl:  valsartan (DIOVAN) 160 MG tablet, Take 320 mg by mouth daily. , Disp: , Rfl:    Objective:     There were no vitals filed for this visit.    There is no height or weight on file to calculate BMI.    Physical Exam:    ***   Electronically signed by:  [name] D.[facility] 7:45 AM 08/24/22

## 2022-08-25 ENCOUNTER — Ambulatory Visit: Payer: Medicare HMO | Admitting: Sports Medicine

## 2022-08-25 VITALS — BP 130/72 | HR 87 | Ht 68.0 in | Wt 223.0 lb

## 2022-08-25 DIAGNOSIS — M7582 Other shoulder lesions, left shoulder: Secondary | ICD-10-CM

## 2022-08-25 DIAGNOSIS — G8929 Other chronic pain: Secondary | ICD-10-CM | POA: Diagnosis not present

## 2022-08-25 DIAGNOSIS — M19012 Primary osteoarthritis, left shoulder: Secondary | ICD-10-CM | POA: Diagnosis not present

## 2022-08-25 DIAGNOSIS — E11618 Type 2 diabetes mellitus with other diabetic arthropathy: Secondary | ICD-10-CM | POA: Diagnosis not present

## 2022-08-25 DIAGNOSIS — M7500 Adhesive capsulitis of unspecified shoulder: Secondary | ICD-10-CM

## 2022-08-25 DIAGNOSIS — M25512 Pain in left shoulder: Secondary | ICD-10-CM

## 2022-08-25 NOTE — Patient Instructions (Addendum)
Good to see you Continue tylenol for daily pain relief  Continue HEP and Pt  6 week follow up

## 2022-08-27 NOTE — Unmapped (Signed)
[facility] Clinical Assessment & Refill Coordination Note    Andrew Dickerson, DOB: 23-Mar-1956  Phone: There are no phone numbers on file.    All above HIPAA information was verified with patient.     Was a Nurse, learning disability used for this call? No    Specialty Medication(s):   Transplant: mycophenolate mofetil 500mg  and tacrolimus 0.5mg      Current Outpatient Medications   Medication Sig Dispense Refill    amlodipine (NORVASC) 10 MG tablet Take 1 tablet (10 mg total) by mouth daily. 90 tablet 3    aspirin (ADULT LOW DOSE ASPIRIN) 81 MG tablet Take 1 tablet (81 mg total) by mouth daily. 90 tablet 3    atorvastatin (LIPITOR) 80 MG tablet Take 1 tablet (80 mg total) by mouth daily. 90 tablet 3    blood sugar diagnostic (ACCU-CHEK AVIVA PLUS TEST STRP) Strp CHECK BLOOD SUGAR BEFORE MEALS, AT BEDTIME, & AS NEEDED FOR HYPERGLYCEMIA UP TO 6 TIMES A DAY 200 strip 11    chlorthalidone (HYGROTON) 25 MG tablet Take 1 tablet (25 mg total) by mouth daily. 30 tablet 11    cholecalciferol, vitamin D3, 1,000 unit capsule Take 3 capsules (75 mcg total) by mouth daily.      dilTIAZem (CARDIZEM CD) 180 MG 24 hr capsule Take 2 capsules (360 mg total) by mouth daily. 60 capsule 11    ezetimibe (ZETIA) 10 mg tablet Take 1 tablet (10 mg total) by mouth daily.      famotidine (PEPCID) 20 MG tablet Take 1 tablet (20 mg total) by mouth Two (2) times a day. 180 tablet 3    levothyroxine (SYNTHROID) 75 MCG tablet Take 1 tablet (75 mcg total) by mouth daily. 30 tablet 11    magnesium 250 mg Tab Take 250 mg by mouth two (2) times a day.      metFORMIN (GLUCOPHAGE) 1000 MG tablet Take 1 tablet (1,000 mg total) by mouth Two (2) times a day.      mycophenolate (CELLCEPT) 500 mg tablet Take 1 tablet (500 mg total) by mouth Two (2) times a day. 180 tablet 3    tacrolimus (PROGRAF) 0.5 MG capsule Take 2 capsules (1 mg total) by mouth daily AND 1 capsule (0.5 mg total) nightly. 90 capsule 11    tamsulosin (FLOMAX) 0.4 mg capsule TAKE 1 CAPSULE EVERY DAY 90 capsule 3    valsartan (DIOVAN) 160 MG tablet Take 2 tablets (320 mg total) by mouth daily. 180 tablet 3     No current facility-administered medications for this visit.        Changes to medications: Cloyde reports no changes at this time.    No Known Allergies    Changes to allergies: No    SPECIALTY MEDICATION ADHERENCE     Tacrolimus 0.5 mg: 14 days of medicine on hand   Mycophenolate 500 mg: 60 days of medicine on hand     Medication Adherence    Patient reported X missed doses in the last month: 0  Specialty Medication: Mycophenolate 500mg   Patient is on additional specialty medications: Yes  Additional Specialty Medications: Tacrolimus 0.5mg   Patient Reported Additional Medication X Missed Doses in the Last Month: 0  Patient is on more than two specialty medications: No                            Specialty medication(s) dose(s) confirmed: Regimen is correct and unchanged.  Are there any concerns with adherence? No    Adherence counseling provided? Not needed    CLINICAL MANAGEMENT AND INTERVENTION      Clinical Benefit Assessment:    Do you feel the medicine is effective or helping your condition? Yes    Clinical Benefit counseling provided? Not needed    Adverse Effects Assessment:    Are you experiencing any side effects? No    Are you experiencing difficulty administering your medicine? No    Quality of Life Assessment:    Quality of Life    Rheumatology  Oncology  Dermatology  Cystic Fibrosis          How many days over the past month did your heart transplant  keep you from your normal activities? For example, brushing your teeth or getting up in the morning. 0    Have you discussed this with your provider? Not needed    Acute Infection Status:    Acute infections noted within Epic:  No active infections  Patient reported infection: None    Therapy Appropriateness:    Is therapy appropriate and patient progressing towards therapeutic goals? Yes, therapy is appropriate and should be continued    DISEASE/MEDICATION-SPECIFIC INFORMATION      N/A    Solid Organ Transplant: Not Applicable    PATIENT SPECIFIC NEEDS     Does the patient have any physical, cognitive, or cultural barriers? No    Is the patient high risk? Yes, patient is taking a REMS drug. Medication is dispensed in compliance with REMS program    Did the patient require a clinical intervention? No    Does the patient require physician intervention or other additional services (i.e., nutrition, smoking cessation, social work)? No    SOCIAL DETERMINANTS OF HEALTH     At the [facility], we have learned that life circumstances - like trouble affording food, housing, utilities, or transportation can affect the health of many of our patients.   That is why we wanted to ask: are you currently experiencing any life circumstances that are negatively impacting your health and/or quality of life? No    Social Determinants of Psychologist, prison and probation services Strain: Not on file   Internet Connectivity: Not on file   Food Insecurity: Not on file   Tobacco Use: Medium Risk (07/01/2022)    Patient History     Smoking Tobacco Use: Former     Smokeless Tobacco Use: Never     Passive Exposure: Not on file   Housing/Utilities: Not on file   Alcohol Use: Not on file   Transportation Needs: Not on file   Substance Use: Not on file   Health Literacy: Not on file   Physical Activity: Not on file   Interpersonal Safety: Not on file   Stress: Not on file   Intimate Partner Violence: Not on file   Depression: Not at risk (07/01/2022)    PHQ-2     PHQ-2 Score: 0   Social Connections: Not on file       Would you be willing to receive help with any of the needs that you have identified today? Not applicable       SHIPPING     Specialty Medication(s) to be Shipped:   Transplant: None- patient declined mycophenolate and tacrolimus today.    Other medication(s) to be shipped: No additional medications requested for fill at this time Changes to insurance: No    Delivery Scheduled: Patient declined refill  at this time due to has at least 2 weeks on hand..     Medication will be delivered via UPS to the confirmed prescription address in [facility].    The patient will receive a drug information handout for each medication shipped and additional FDA Medication Guides as required.  Verified that patient has previously received a Conservation officer, historic buildings and a Surveyor, mining.    The patient or caregiver noted above participated in the development of this care plan and knows that they can request review of or adjustments to the care plan at any time.      All of the patient's questions and concerns have been addressed.    [name], [facility]   [facility] Specialty Pharmacist

## 2022-09-08 DIAGNOSIS — E559 Vitamin D deficiency, unspecified: Principal | ICD-10-CM

## 2022-09-08 DIAGNOSIS — Z125 Encounter for screening for malignant neoplasm of prostate: Principal | ICD-10-CM

## 2022-09-08 DIAGNOSIS — Z79899 Other long term (current) drug therapy: Principal | ICD-10-CM

## 2022-09-08 DIAGNOSIS — E785 Hyperlipidemia, unspecified: Principal | ICD-10-CM

## 2022-09-08 DIAGNOSIS — E119 Type 2 diabetes mellitus without complications: Principal | ICD-10-CM

## 2022-09-08 DIAGNOSIS — Z941 Heart transplant status: Principal | ICD-10-CM

## 2022-09-08 NOTE — Unmapped (Signed)
Lab orders placed for pt's upcoming appointment on 09/15/2022.

## 2022-09-15 ENCOUNTER — Ambulatory Visit: Admit: 2022-09-15 | Discharge: 2022-09-15 | Payer: MEDICARE

## 2022-09-15 ENCOUNTER — Encounter
Admit: 2022-09-15 | Discharge: 2022-09-15 | Payer: MEDICARE | Attending: Cardiovascular Disease | Primary: Cardiovascular Disease

## 2022-09-15 DIAGNOSIS — E559 Vitamin D deficiency, unspecified: Secondary | ICD-10-CM | POA: Diagnosis not present

## 2022-09-15 DIAGNOSIS — Z125 Encounter for screening for malignant neoplasm of prostate: Secondary | ICD-10-CM | POA: Diagnosis not present

## 2022-09-15 DIAGNOSIS — Z941 Heart transplant status: Secondary | ICD-10-CM | POA: Diagnosis not present

## 2022-09-15 DIAGNOSIS — E785 Hyperlipidemia, unspecified: Secondary | ICD-10-CM | POA: Diagnosis not present

## 2022-09-15 DIAGNOSIS — Z4821 Encounter for aftercare following heart transplant: Secondary | ICD-10-CM | POA: Diagnosis not present

## 2022-09-15 DIAGNOSIS — R937 Abnormal findings on diagnostic imaging of other parts of musculoskeletal system: Secondary | ICD-10-CM | POA: Diagnosis not present

## 2022-09-15 DIAGNOSIS — E119 Type 2 diabetes mellitus without complications: Secondary | ICD-10-CM | POA: Diagnosis not present

## 2022-09-15 DIAGNOSIS — Z79899 Other long term (current) drug therapy: Secondary | ICD-10-CM | POA: Diagnosis not present

## 2022-09-15 DIAGNOSIS — Z23 Encounter for immunization: Secondary | ICD-10-CM | POA: Diagnosis not present

## 2022-09-15 LAB — LIPID PANEL
CHOLESTEROL/HDL RATIO SCREEN: 3.5 (ref 1.0–4.5)
CHOLESTEROL: 144 mg/dL (ref <=200)
Cholesterol: 144 (ref 0–200)
HDL CHOLESTEROL: 41 mg/dL (ref 40–60)
HDL: 41 (ref 35–70)
LDL CHOLESTEROL CALCULATED: 78 mg/dL (ref 40–99)
LDL Cholesterol: 78
NON-HDL CHOLESTEROL: 103 mg/dL (ref 70–130)
TRIGLYCERIDES: 127 mg/dL (ref 0–150)
Triglycerides: 127 (ref 40–160)
VLDL CHOLESTEROL CAL: 25.4 mg/dL (ref 12–42)

## 2022-09-15 LAB — CBC W/ AUTO DIFF
BASOPHILS ABSOLUTE COUNT: 0 10*9/L (ref 0.0–0.1)
BASOPHILS RELATIVE PERCENT: 0.5 %
EOSINOPHILS ABSOLUTE COUNT: 0.1 10*9/L (ref 0.0–0.5)
EOSINOPHILS RELATIVE PERCENT: 0.8 %
HEMATOCRIT: 38.7 % — ABNORMAL LOW (ref 39.0–48.0)
HEMOGLOBIN: 13 g/dL (ref 12.9–16.5)
LYMPHOCYTES ABSOLUTE COUNT: 1 10*9/L — ABNORMAL LOW (ref 1.1–3.6)
LYMPHOCYTES RELATIVE PERCENT: 11 %
MEAN CORPUSCULAR HEMOGLOBIN CONC: 33.7 g/dL (ref 32.0–36.0)
MEAN CORPUSCULAR HEMOGLOBIN: 30 pg (ref 25.9–32.4)
MEAN CORPUSCULAR VOLUME: 89 fL (ref 77.6–95.7)
MEAN PLATELET VOLUME: 7.4 fL (ref 6.8–10.7)
MONOCYTES ABSOLUTE COUNT: 0.5 10*9/L (ref 0.3–0.8)
MONOCYTES RELATIVE PERCENT: 5.8 %
NEUTROPHILS ABSOLUTE COUNT: 7.6 10*9/L (ref 1.8–7.8)
NEUTROPHILS RELATIVE PERCENT: 81.9 %
PLATELET COUNT: 301 10*9/L (ref 150–450)
RED BLOOD CELL COUNT: 4.34 10*12/L (ref 4.26–5.60)
RED CELL DISTRIBUTION WIDTH: 15.5 % — ABNORMAL HIGH (ref 12.2–15.2)
WBC ADJUSTED: 9.3 10*9/L (ref 3.6–11.2)

## 2022-09-15 LAB — COMPREHENSIVE METABOLIC PANEL
ALBUMIN: 3.9 g/dL (ref 3.4–5.0)
ALKALINE PHOSPHATASE: 89 U/L (ref 46–116)
ALT (SGPT): 15 U/L (ref 10–49)
ANION GAP: 10 mmol/L (ref 5–14)
AST (SGOT): 12 U/L (ref <=34)
BILIRUBIN TOTAL: 0.9 mg/dL (ref 0.3–1.2)
BLOOD UREA NITROGEN: 26 mg/dL — ABNORMAL HIGH (ref 9–23)
BUN / CREAT RATIO: 20
CALCIUM: 9.9 mg/dL (ref 8.7–10.4)
CHLORIDE: 109 mmol/L — ABNORMAL HIGH (ref 98–107)
CO2: 22 mmol/L (ref 20.0–31.0)
CREATININE: 1.31 mg/dL — ABNORMAL HIGH
EGFR CKD-EPI (2021) MALE: 60 mL/min/{1.73_m2} (ref >=60)
GLUCOSE RANDOM: 139 mg/dL — ABNORMAL HIGH (ref 70–99)
POTASSIUM: 4.4 mmol/L (ref 3.5–5.1)
PROTEIN TOTAL: 6.9 g/dL (ref 5.7–8.2)
SODIUM: 141 mmol/L (ref 135–145)

## 2022-09-15 LAB — PSA
PROSTATE SPECIFIC ANTIGEN: 0.66 ng/mL (ref 0.00–4.00)
PSA: 0.66

## 2022-09-15 LAB — HEMOGLOBIN A1C
ESTIMATED AVERAGE GLUCOSE: 137 mg/dL
HEMOGLOBIN A1C: 6.4 % — ABNORMAL HIGH (ref 4.8–5.6)
Hemoglobin A1C: 6.4

## 2022-09-15 LAB — TACROLIMUS LEVEL, TROUGH: TACROLIMUS, TROUGH: 5.9 ng/mL (ref 5.0–15.0)

## 2022-09-15 LAB — MAGNESIUM: MAGNESIUM: 1.9 mg/dL (ref 1.6–2.6)

## 2022-09-15 LAB — TSH
THYROID STIMULATING HORMONE: 1.825 u[IU]/mL (ref 0.550–4.780)
TSH: 1.25 (ref 0.41–5.90)

## 2022-09-15 MED ADMIN — DOBUTamine 1,000 mg in dextrose 5% 250 ml (4,000 mcg/ml) infusion PMB: INTRAVENOUS | @ 19:00:00 | Stop: 2022-09-15

## 2022-09-15 MED ADMIN — perflutren lipid microspheres (DEFINITY) injection 1.5 mL: 1.5 mL | INTRAVENOUS | @ 19:00:00 | Stop: 2022-09-15

## 2022-09-15 NOTE — Unmapped (Signed)
Great to see you today! We will follow up with your test results and labwork.  We gave you your pneumococcal 20 vaccine today (no more doses needed) and your shingles vaccine (need 2nd dose in 2-3 months)  You should also get your RSV vaccine at a local pharmacy

## 2022-09-15 NOTE — Unmapped (Signed)
Per provider, the patient received Shingrix-Zoster (HZV) and Pneumococcal Conjugate 20-valent vaccine.  Patient ID verified with name and date of birth.  All screening questions were answered.  Vaccine(s) were administered as ordered.  See immunization history for documentation.  Patient tolerated the injection(s) well with no issues noted.  Vaccine Information sheet given to the patient.

## 2022-09-16 DIAGNOSIS — Z941 Heart transplant status: Secondary | ICD-10-CM | POA: Diagnosis not present

## 2022-09-16 NOTE — Unmapped (Signed)
[facility] Refill Coordination Note    Specialty Medication(s) to be Shipped:   Transplant: tacrolimus 0.5mg     Other medication(s) to be shipped:    chlorthalidone ,  diltiazem ,  levothyroxine       Andrew Dickerson, DOB: 05-10-1956  Phone: There are no phone numbers on file.      All above HIPAA information was verified with patient.     Was a Nurse, learning disability used for this call? No    Completed refill call assessment today to schedule patient's medication shipment from the [facility] [phone]).  All relevant notes have been reviewed.     Specialty medication(s) and dose(s) confirmed: Regimen is correct and unchanged.   Changes to medications: Andrew Dickerson reports no changes at this time.  Changes to insurance: No  New side effects reported not previously addressed with a pharmacist or physician: None reported  Questions for the pharmacist: No    Confirmed patient received a Conservation officer, historic buildings and a Surveyor, mining with first shipment. The patient will receive a drug information handout for each medication shipped and additional FDA Medication Guides as required.       DISEASE/MEDICATION-SPECIFIC INFORMATION        N/A    SPECIALTY MEDICATION ADHERENCE     Medication Adherence    Patient reported X missed doses in the last month: 0  Specialty Medication: tacrolimus 0.5 MG capsule (PROGRAF)  Patient is on additional specialty medications: No  Patient is on more than two specialty medications: No  Any gaps in refill history greater than 2 weeks in the last 3 months: no  Demonstrates understanding of importance of adherence: yes                                Were doses missed due to medication being on hold? No       tacrolimus 0.5 mg: 7-10- days of medicine on hand        REFERRAL TO PHARMACIST     Referral to the pharmacist: Not needed      [facility]     Shipping address confirmed in Epic.     Delivery Scheduled: Yes, Expected medication delivery date: 09/22/22 . Medication will be delivered via UPS to the prescription address in Epic WAM.    Andrew Dickerson   [facility] Specialty Technician

## 2022-09-17 LAB — HLA DS POST TRANSPLANT
ANTI-DONOR DRW #1 MFI: 177 MFI
ANTI-DONOR DRW #2 MFI: 223 MFI
ANTI-DONOR HLA-A #1 MFI: 121 MFI
ANTI-DONOR HLA-A #2 MFI: 28 MFI
ANTI-DONOR HLA-B #1 MFI: 31 MFI
ANTI-DONOR HLA-B #2 MFI: 73 MFI
ANTI-DONOR HLA-C #1 MFI: 93 MFI
ANTI-DONOR HLA-C #2 MFI: 141 MFI
ANTI-DONOR HLA-DQB #1 MFI: 74 MFI
ANTI-DONOR HLA-DQB #2 MFI: 50 MFI
ANTI-DONOR HLA-DR #1 MFI: 28 MFI
ANTI-DONOR HLA-DR #2 MFI: 22 MFI

## 2022-09-17 LAB — FSAB CLASS 1 ANTIBODY SPECIFICITY: HLA CLASS 1 ANTIBODY RESULT: NEGATIVE

## 2022-09-17 LAB — FSAB CLASS 2 ANTIBODY SPECIFICITY: HLA CL2 AB RESULT: POSITIVE

## 2022-09-17 NOTE — Unmapped (Signed)
Discussed recent labs with [facility], CPP.  Plan is to Make No Changes with repeat labs in 3 Months.    A call was placed to the pt and an update was provided regarding the lab results. Pt was informed to get his blood work repeated again in 3 months.     Andrew Dickerson verbalized understanding & agreed with the plan.      Lab Results   Component Value Date    TACROLIMUS 5.9 09/15/2022     Goal: Tac: 5-8  Current Dose: Tacrolimus: 1 mg in the AM and 0.5 mg in the PM    Lab Results   Component Value Date    BUN 26 (H) 09/15/2022    CREATININE 1.31 (H) 09/15/2022    K 4.4 09/15/2022    GLU 139 (H) 09/15/2022    MG 1.9 09/15/2022     Lab Results   Component Value Date    WBC 9.3 09/15/2022    HGB 13.0 09/15/2022    HCT 38.7 (L) 09/15/2022    PLT 301 09/15/2022    NEUTROABS 7.6 09/15/2022    EOSABS 0.1 09/15/2022

## 2022-09-18 NOTE — Unmapped (Signed)
[facility] PHARMACY NOTE  09/17/2022   Andrew Dickerson  [phone]    Medication changes today:   1. Prevnar 20 and Shingles vaccines today    Education/Adherence tools provided today:  1.provided updated medication list    Follow up items:  1. RSV vaccine locally, 2nd shingrix    Next visit with pharmacy in [place]  ____________________________________________________________________    Andrew Dickerson is a 67 y.o. male s/p heart transplant on 07/18/2012 (Heart) 2/2  ischemic cardiomyopathy . Patient was maintained on LVAD support prior to transplantation.    Other PMH significant for hypertension, HLD, Diabetes, BPH    Seen by pharmacy today for:  med management in conjunction with annual testing    CC:  Patient has no complaints today        Vitals:    09/15/22 1047   BP: 129/62   Pulse: 91   Temp: 36.8 ??C (98.2 ??F)       No Known Allergies    All medications reviewed and updated. Medication list includes revisions made during today???s encounter    Outpatient Encounter Medications as of 09/15/2022   Medication Sig Dispense Refill    amlodipine (NORVASC) 10 MG tablet Take 1 tablet (10 mg total) by mouth daily. 90 tablet 3    aspirin (ADULT LOW DOSE ASPIRIN) 81 MG tablet Take 1 tablet (81 mg total) by mouth daily. 90 tablet 3    atorvastatin (LIPITOR) 80 MG tablet Take 1 tablet (80 mg total) by mouth daily. 90 tablet 3    blood sugar diagnostic (ACCU-CHEK AVIVA PLUS TEST STRP) Strp CHECK BLOOD SUGAR BEFORE MEALS, AT BEDTIME, & AS NEEDED FOR HYPERGLYCEMIA UP TO 6 TIMES A DAY 200 strip 11    chlorthalidone (HYGROTON) 25 MG tablet Take 1 tablet (25 mg total) by mouth daily. 30 tablet 11    cholecalciferol, vitamin D3, 1,000 unit capsule Take 3 capsules (75 mcg total) by mouth daily.      dilTIAZem (CARDIZEM CD) 180 MG 24 hr capsule Take 2 capsules (360 mg total) by mouth daily. 60 capsule 11    ezetimibe (ZETIA) 10 mg tablet Take 1 tablet (10 mg total) by mouth daily.      famotidine (PEPCID) 20 MG tablet Take 1 tablet (20 mg total) by mouth Two (2) times a day. 180 tablet 3    levothyroxine (SYNTHROID) 75 MCG tablet Take 1 tablet (75 mcg total) by mouth daily. 30 tablet 11    magnesium 250 mg Tab Take 250 mg by mouth two (2) times a day.      metFORMIN (GLUCOPHAGE) 1000 MG tablet Take 1 tablet (1,000 mg total) by mouth Two (2) times a day.      mycophenolate (CELLCEPT) 500 mg tablet Take 1 tablet (500 mg total) by mouth Two (2) times a day. 180 tablet 3    tacrolimus (PROGRAF) 0.5 MG capsule Take 2 capsules (1 mg total) by mouth daily AND 1 capsule (0.5 mg total) nightly. 90 capsule 11    tamsulosin (FLOMAX) 0.4 mg capsule TAKE 1 CAPSULE EVERY DAY 90 capsule 3    valsartan (DIOVAN) 160 MG tablet Take 2 tablets (320 mg total) by mouth daily. 180 tablet 3     No facility-administered encounter medications on file as of 09/15/2022.       Induction agent : None    CURRENT IMMUNOSUPPRESSION:   tacrolimus 1 mg qAM, 0.5 mg qPM PO prograf goal:  5-8   mycophenolate 500 mg  PO BID   steroid free     Patient  has mild and tolerable tremor that is stable    IMMUNOSUPPRESSION DRUG LEVELS:  Lab Results   Component Value Date    Tacrolimus, Trough 5.9 09/15/2022    Tacrolimus, Trough 4.5 (L) 06/03/2022    Tacrolimus, Trough 6.9 03/30/2022    Tacrolimus, Trough 7.6 11/29/2014    Tacrolimus, Trough 9.2 07/24/2014    Tacrolimus, Trough 9.0 06/05/2014     Prograf level is accurate 12 hour trough; takes medication a 0800 and 2000    Graft function: stable  DSA:  has a history of weakly positive 11/18; DSA panels WNL ever since with most recent levels on  03/25/20  Biopsies to date: First two post transplant biopsies in 2013/2014 were positive for mild ACR, but no rejections since  WBC/ANC:   WBC WNL; ANC slighly above normal range    Plan: Will maintain current immunosuppression pending tac level and cardiac testing    CAV Prophylaxis:   Aspirin: asa 81 mg daily  Statin: atorvastatin; currently 80 mg  Misc: diltiazem currently on 360 mg daily  Plan: Continue    BP: Goal < 140/90. Encounter vitals reported above  Home BP ranges: 120s-130s/60s-70s with majority of values <140/90 mmHg  Current meds include: amlodipine 10 mg qAM, diltiazem 360 mg qPM, chlorthalidone 25 mg qAM, valsartan 320 mg daily  Plan: within goal; continue medications as above    Anemia:  Lab Results   Component Value Date    HGB 13.0 09/15/2022     Lab Results   Component Value Date    HCT 38.7 (L) 09/15/2022     Iron panel:  Lab Results   Component Value Date    IRON 90 06/09/2012    TIBC 336 06/09/2012    FERRITIN 511 (H) 06/09/2012     Lab Results   Component Value Date    Iron Saturation (%) 27 06/09/2012     Plan: hematocrit/hemoglobin within goal. Continue to monitor.      DM:   Lab Results   Component Value Date    A1C 6.4 (H) 09/15/2022   Goal A1c < 7  Currently on: metformin 1000 mg PO BID  Home BS log: fasting AM BGs range 119-149  Exercise: walks occasionally  Hypoglycemia: no  Plan:   His PCP is managing his diabetes, within goal will continue to monitor    Electrolytes: wnl  Meds currently on: magnesium 400 mg BID  Plan: Continue medication as above    GI:  Denies constipation; endorses occasional reflux  Meds currently on: famotidine 20 mg PO BID  Plan: Continue medication as above.    Pain:  denies pain at this time  Meds currently on: NTD  Plan: Continue to monitor    Bone health:   Patient currently off steroid therapy  Vitamin D Level: last level is 53 on 09/23/20; Goal > 30   Last DEXA results: N/A  Current meds include: cholecalciferol 3000 units daily  Plan: Within goal. Continue to monitor.     [facility]:  NITAI Dickerson is a 67 y.o. male. Patient reports no Andrew/women's health issues. Denies urinary symptoms at this time.  Most recent PSA 0.7 today  Meds currently on: tamsulosin 0.4 mg daily  Plan: Continue medication as above.     Hypothyroidism  Last TSH 2.093 today  Meds currently on: Levothyroxine 75 mcg daily  Plan: Within goal, continue to monitor  Adherence: Patient has excellent understanding of medications.  Patient  does fill their own pill box on a regular basis at home.  Patient brought medication card: Utilizes an app on his phone to track and remind him to take medications  Pill box:did not bring  Patient requested refills for the following meds: None  Corrections needed in Epic medication list: Added Zetia 10 mg daily  Provided moderate adherence counseling/intervention      Spent approximately 20 minutes on educating this patient and greater than 50% was spent in direct face to face counseling regarding post transplant medication education. Questions and concerns were address to patient's satisfaction.    During this visit, the following was completed:   BG log data assessment  BP log data assessment  Labs ordered and evaluated  complex treatment plan >1 DS   Patient education was completed for 11-24 minutes     All questions/concerns were addressed to the patient's satisfaction.  __________________________________________  PATIENT SEEN AND EVALUATED BY:    Rajiv Parlato TEETER Solangel Mcmanaway, PHARMD, BCPS, CPP  [facility] PHARMACIST PRACTITIONER  PAGER [phone]

## 2022-09-20 LAB — VITAMIN D 25 HYDROXY: VITAMIN D, TOTAL (25OH): 35.4 ng/mL (ref 20.0–80.0)

## 2022-09-20 MED FILL — LEVOTHYROXINE 75 MCG TABLET: ORAL | 30 days supply | Qty: 30 | Fill #2

## 2022-09-20 MED FILL — CHLORTHALIDONE 25 MG TABLET: ORAL | 30 days supply | Qty: 30 | Fill #2

## 2022-09-20 MED FILL — TACROLIMUS 0.5 MG CAPSULE, IMMEDIATE-RELEASE: ORAL | 30 days supply | Qty: 90 | Fill #6

## 2022-09-20 MED FILL — DILTIAZEM CD 180 MG CAPSULE,EXTENDED RELEASE 24 HR: ORAL | 30 days supply | Qty: 60 | Fill #2

## 2022-09-21 NOTE — Unmapped (Signed)
Reviewed DSA with Chrissy Doligaski CPP. No action required. Negative DSA. Repeat in one year.

## 2022-09-22 NOTE — Progress Notes (Signed)
Care Management & Coordination Services Pharmacy Note  09/29/2022 Name:  [name] MRN:  [id number] DOB:  03/23/1956  Summary: PharmD FU visit.  A1c continues to be controlled, BP excellent at recent OV.  FBG 131 this morning.  Euvolemic, denies swelling and weight increase.  Denies SOB.  DM with HF could consider Farxiga instead of metformin.  Has visit with PCP scheduled for May 2024.  Recommendations/Changes made from today's visit: Due for diabetic foot exam, due for kidney eval (UACR).  Would consider SGLT-2 if elevated  Follow up plan: FU 6 months   Subjective: [name] is an 67 y.o. year old male who is a primary patient of Yong Channel, Brayton Mars, MD.  The care coordination team was consulted for assistance with disease management and care coordination needs.    Engaged with patient by telephone for follow up visit.  Recent office visits:  06/30/2022 OV (PCP) Marin Olp, MD; no medication changes indicated.   Recent consult visits:  07/05/2022 OV (Sports Medicine) Glennon Mac, DO; no medication changes noted.   07/01/2022 OV (Gen Surgery) London Sheer, MD; no medication changes noted.   Hospital visits:  None in previous 6 months   Objective:  Lab Results  Component Value Date   CREATININE 1.37 03/30/2022   BUN 20 03/30/2022   GFR 54.22 (L) 03/30/2022   GFRNONAA 48 10/22/2020   GFRAA 56 10/22/2020   NA 135 03/30/2022   K 4.8 03/30/2022   CALCIUM 9.6 03/30/2022   CO2 20 03/30/2022   GLUCOSE 134 (H) 03/30/2022    Lab Results  Component Value Date/Time   HGBA1C 6.0 (A) 06/30/2022 10:57 AM   HGBA1C 6.4 12/10/2021 12:00 AM   HGBA1C 6.0 04/24/2021 12:00 AM   GFR 54.22 (L) 03/30/2022 11:40 AM   GFR 55.68 (L) 12/17/2020 11:33 AM    Last diabetic Eye exam:  Lab Results  Component Value Date/Time   HMDIABEYEEXA No Retinopathy 06/20/2020 10:07 AM    Last diabetic Foot exam: No results found for: "HMDIABFOOTEX"   Lab  Results  Component Value Date   CHOL 124 04/24/2021   HDL 33 (A) 12/10/2021   LDLCALC 63 12/10/2021   LDLDIRECT 69 02/12/2014   TRIG 135 12/10/2021   TRIG 118 12/10/2021   CHOLHDL 5.2 CALC 03/05/2008       Latest Ref Rng & Units 12/14/2021   12:00 AM 04/24/2021   12:00 AM 12/17/2020   11:33 AM  Hepatic Function  Total Protein 6.0 - 8.3 g/dL   7.1   Albumin 3.5 - 5.0  4.6     4.3   AST 14 - 40  12     12   ALT 10 - 40 U/L '18  16     16   '$ Alk Phosphatase 25 - 125  78     79   Total Bilirubin 0.2 - 1.2 mg/dL   0.8      This result is from an external source.         Latest Ref Rng & Units 03/30/2022   11:40 AM 12/14/2021   12:00 AM 04/24/2021   12:00 AM  CBC  WBC 4.0 - 10.5 K/uL 8.3  10.1  9.9      Hemoglobin 13.0 - 17.0 g/dL 13.1  13.4  13.1      Hematocrit 39.0 - 52.0 % 39.3  40  41      Platelets 150.0 - 400.0 K/uL 267.0  297  293  This result is from an external source.    Lab Results  Component Value Date/Time   VD25OH 46 04/24/2021 12:00 AM   VITAMINB12 342 04/03/2008 11:25 AM    Clinical ASCVD: Yes  The 10-year ASCVD risk score (Arnett DK, et al., 2019) is: 17.4%   Values used to calculate the score:     Age: 17 years     Sex: Male     Is Non-Hispanic African American: No     Diabetic: Yes     Tobacco smoker: No     Systolic Blood Pressure: 387 mmHg     Is BP treated: Yes     HDL Cholesterol: 41 mg/dL     Total Cholesterol: 144 mg/dL        07/13/2022    2:50 PM 06/30/2022   11:02 AM 06/05/2021    2:50 PM  Depression screen PHQ 2/9  Decreased Interest 0 0 0  Down, Depressed, Hopeless 0 0 0  PHQ - 2 Score 0 0 0     Social History   Tobacco Use  Smoking Status Former   Packs/day: 1.00   Years: 20.00   Total pack years: 20.00   Types: Cigarettes   Quit date: 07/02/1999   Years since quitting: 23.2  Smokeless Tobacco Never   BP Readings from Last 3 Encounters:  08/25/22 130/72  08/04/22 130/78  07/05/22 122/84   Pulse Readings  from Last 3 Encounters:  08/25/22 87  08/04/22 95  07/05/22 95   Wt Readings from Last 3 Encounters:  08/25/22 223 lb (101.2 kg)  08/04/22 223 lb (101.2 kg)  07/13/22 222 lb (100.7 kg)   BMI Readings from Last 3 Encounters:  08/25/22 33.91 kg/m  08/04/22 33.91 kg/m  07/13/22 33.75 kg/m    No Known Allergies  Medications Reviewed Today     Reviewed by Willette Brace, LPN (Licensed Practical Nurse) on 07/13/22 at 64  Med List Status: <None>   Medication Order Taking? Sig Documenting Provider Last Dose Status Informant  amLODipine (NORVASC) 10 MG tablet [id number] Yes Take 10 mg by mouth daily. Per [facility] [provider] Taking Active   aspirin EC 81 MG tablet [id number] Yes Take 1 tablet (81 mg total) by mouth daily. Marin Olp, MD Taking Active   atorvastatin (LIPITOR) 80 MG tablet [id number] Yes Take 1 tablet (80 mg total) by mouth daily at 6 PM. Marin Olp, MD Taking Active   blood glucose meter kit and supplies KIT [id number] Yes Dispense based on patient and insurance preference. Use up to four times daily as directed. Marin Olp, MD Taking Active   chlorthalidone (HYGROTON) 25 MG tablet [id number] Yes Take 1 tablet (25 mg total) by mouth daily. Marin Olp, MD Taking Active   cholecalciferol (VITAMIN D) 1000 units tablet [id number] Yes Take 3 tablets (3,000 Units total) by mouth daily. Marin Olp, MD Taking Active   diltiazem [facility]) 180 MG 24 hr capsule [id number] Yes Take 2 capsules (360 mg total) by mouth daily. Marin Olp, MD Taking Active   ezetimibe (ZETIA) 10 MG tablet [id number] Yes Take 1 tablet by mouth once daily Marin Olp, MD Taking Active   famotidine (PEPCID) 20 MG tablet [id number]  Take 20 mg by mouth in the morning and at bedtime. [provider]  Expired 06/30/22 2359   furosemide (LASIX) 20 MG tablet [id number]  Take 1 tablet (20 mg total) by mouth 2 (two) times daily for 3 days.  Patient not  taking: Reported on 06/30/2022   Allwardt, Alyssa M, PA-C  Expired 04/02/22 2359   levothyroxine (SYNTHROID, LEVOTHROID) 75 MCG tablet [id number] Yes Take 1 tablet (75 mcg total) by mouth daily before breakfast. Marin Olp, MD Taking Active   magnesium oxide (MAG-OX) 400 MG tablet [id number] Yes Take 1.5 tablets (600 mg total) by mouth 2 (two) times daily. Marin Olp, MD Taking Active   metFORMIN (GLUCOPHAGE) 1000 MG tablet [id number] Yes TAKE 1 TABLET BY MOUTH TWICE DAILY WITH MEALS Marin Olp, MD Taking Active   mycophenolate (CELLCEPT) 500 MG tablet [id number] Yes Take 1 tablet (500 mg total) by mouth 2 (two) times daily. Marin Olp, MD Taking Active   tacrolimus (PROGRAF) 0.5 MG capsule [id number] Yes Take 2 capsules (1 mg total) by mouth 2 (two) times daily.  Patient taking differently: Take by mouth 2 (two) times daily. 2 tablets in the AM 1 tablet in the PM   Marin Olp, MD Taking Active   tamsulosin [facility]) 0.4 MG CAPS capsule [id number] Yes Take 0.4 mg by mouth. [provider] Taking Active   valsartan (DIOVAN) 160 MG tablet [id number] Yes Take 320 mg by mouth daily.  [provider] Taking Active             SDOH:  (Social Determinants of Health) assessments and interventions performed: No, done within the past year Financial Resource Strain: Low Risk  (07/13/2022)   Overall Financial Resource Strain (CARDIA)    Difficulty of Paying Living Expenses: Not hard at all    SDOH Interventions    [place] from 07/13/2022 in [place] from 06/05/2021 in [place] from 12/20/2017 in [place] Interventions Intervention Not Indicated Intervention Not Indicated --  Housing Interventions Intervention Not Indicated -- --  Transportation Interventions Intervention Not  Indicated Intervention Not Indicated --  Depression Interventions/Treatment  -- -- --  [NA]  Financial Strain Interventions Intervention Not Indicated Intervention Not Indicated --  Physical Activity Interventions Intervention Not Indicated Intervention Not Indicated --  Stress Interventions -- Intervention Not Indicated --  Social Connections Interventions Intervention Not Indicated Intervention Not Indicated --      SDOH Screenings   Food Insecurity: No Food Insecurity (07/13/2022)  Housing: Low Risk  (07/13/2022)  Transportation Needs: No Transportation Needs (07/13/2022)  Alcohol Screen: Low Risk  (06/05/2021)  Depression (PHQ2-9): Low Risk  (07/13/2022)  Financial Resource Strain: Low Risk  (07/13/2022)  Physical Activity: Inactive (07/13/2022)  Social Connections: Moderately Isolated (07/13/2022)  Stress: No Stress Concern Present (06/05/2021)  Tobacco Use: Medium Risk (07/13/2022)    Medication Assistance: None required.  Patient affirms current coverage meets needs.  Medication Access: Within the past 30 days, how often has patient missed a dose of medication? 0 Is a pillbox or other method used to improve adherence? Yes  Factors that may affect medication adherence? no barriers identified Are meds synced by current pharmacy? No  Are meds delivered by current pharmacy? No  Does patient experience delays in picking up medications due to transportation concerns? No   [facility] Reviewed: Is patient disadvantaged to use [facility]?: Yes  Current Rx insurance plan: [employer] Name and location of Current pharmacy:  [place] [address] ([address] [place] [address] [place] ([place])  [address] Phone: [phone] Fax: [phone]  [facility]  services reviewed with patient today?: Yes  Patient requests to transfer care to [facility]?: No  Reason patient declined to change pharmacies: Loyalty to other pharmacy/Patient  preference  Compliance/Adherence/Medication fill history: Care Gaps: Foot exam, UACR  Star-Rating Drugs: Metformin 1000 mg 08/03/22 90ds Atorvastatin '80mg'$  08/09/22 90ds  Assessment/Plan     Hypertension (BP goal <130/80) -Controlled, not assessed -Current treatment: Amlodipine '10mg'$  daily Appropriate, Query effective, Safe, Accessible Chlorthalidone '25mg'$  daily  Appropriate, Query effective, Safe, Accessible Diltiazem '180mg'$  CD daily  Appropriate, Query effective, Safe, Accessible Valsartan '160mg'$  daily  Appropriate, Query effective, Safe, Accessible -Medications previously tried: none noted  -Current home readings: 137/71 this morning -Current dietary habits: unchanged see previous  -Denies hypotensive/hypertensive symptoms -Educated on BP goals and benefits of medications for prevention of heart attack, stroke and kidney damage; Exercise goal of 150 minutes per week; Importance of home blood pressure monitoring; Symptoms of hypotension and importance of maintaining adequate hydration; -Counseled to monitor BP at home weekly, document, and provide log at future appointments -Recommended to continue current medication  Update 11/10/21 141/61, 132/66 recent BP readings Denies dizziness or HA. BP well controlled for now no changes needed. Continue current medications  Hyperlipidemia: (LDL goal < 70) 05/24/22 -Controlled, based on LDL at [facility], not assessed -Current treatment: Atorvastatin '80mg'$  daily Appropriate, Effective, Safe, Accessible Zetia '10mg'$  daily Appropriate, Effective, Safe, Accessible -Medications previously tried: none noted  -Reports he just had labs drawn at [facility], last LDL was excellent. -Educated on Cholesterol goals;  Benefits of statin for ASCVD risk reduction; Importance of limiting foods high in cholesterol; -Denies any adverse effects -Most recent LDL at [facility] was 63, continues to tolerate medication.  No changes needed at this time.  Heart Failure  (Goal: manage symptoms and prevent exacerbations) -Controlled -Last ejection fraction: 65%  -HF type: Systolic -NYHA Class: I (no actitivty limitation) -AHA HF Stage: B (Heart disease present - no symptoms present) -Current treatment: Chlorthalidone '25mg'$  daily  Appropriate, Query effective, Safe, Accessible -Medications previously tried: none noted  -Current home BP/HR readings: controlled -Current home daily weights: monitors fluid -Current dietary habits: same see previous -Current exercise habits: same see previous -Educated on Benefits of medications for managing symptoms and prolonging life Importance of weighing daily; if you gain more than 3 pounds in one day or 5 pounds in one week, contact providers -Recommended to continue current medication Could consider addition of Farxiga for dual benefit.    Diabetes (A1c goal <6.5%) 09/29/22 -Controlled -Current medications: Metformin '1000mg'$  BID with meals Appropriate, Query effective, -Medications previously tried: insulin glargine  -Current home glucose readings fasting glucose: 131 this morning -Denies hypoglycemic/hyperglycemic symptoms -Educated on A1c and blood sugar goals; Prevention and management of hypoglycemic episodes; Carbohydrate counting and/or plate method -Counseled to check feet daily and get yearly eye exams -A1c well controlled on just metformin.  Patient also has CHF so would benefit form SGLT-2.  Could consider d/c metformin and start [place] or Jardiance.  Denies any volume issues as this time.  Denies SOB. He is monitoring glucose and it has been normal.  Last A1c was 6.4 at transplant doctor.   Update 11/10/21 FBG - 130-150s He is eating two nutty buddy crackers per day.  States blood sugar is "creeping up."  We discussed trying to limit that to one cracker per day to see if that helps glucose. Has upcoming appt in April with PCP - recheck A1c and adjust treatment at that time. NO changes except  lifestyle mods at this  time.  Patient Goals/Self-Care Activities Patient will:  - take medications as prescribed check glucose daily, document, and provide at future appointments check blood pressure weekly, document, and provide at future appointments  Follow Up Plan: The care management team will reach out to the patient again over the next 180 days.              Beverly Milch, PharmD Clinical Pharmacist  [facility] [phone]

## 2022-09-24 NOTE — Unmapped (Signed)
Reviewed with Dr. Cherly Hensen    Dobutamine Stress Echo    Summary    1. Pharmacologic stress echocardiogram is normal.    2. No electrocardiographic or echocardiographic evidence of myocardial  ischemia.    3. Ultrasound enhancing agent utilized to improved endocardial border  definition.      CXR: FINDINGS:      Lungs are clear. No pleural effusion or pneumothorax.     Stable cardiomediastinal silhouette.     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     Again noted multiple left-sided remote rib fractures with deformity.    DSA: Negative      Recent Labs:   Appointment on 09/15/2022   Component Date Value Ref Range Status    EKG Ventricular Rate 09/15/2022 92  BPM Final    EKG Atrial Rate 09/15/2022 92  BPM Final    EKG P-R Interval 09/15/2022 124  ms Final    EKG QRS Duration 09/15/2022 80  ms Final    EKG Q-T Interval 09/15/2022 344  ms Final    EKG QTC Calculation 09/15/2022 425  ms Final    EKG Calculated P Axis 09/15/2022 20  degrees Final    EKG Calculated R Axis 09/15/2022 9  degrees Final    EKG Calculated T Axis 09/15/2022 25  degrees Final    QTC Fredericia 09/15/2022 396  ms Final   Appointment on 09/15/2022   Component Date Value Ref Range Status    Tacrolimus, Trough 09/15/2022 5.9  5.0 - 15.0 ng/mL Final    PSA 09/15/2022 0.66  0.00 - 4.00 ng/mL Final    HLA Class 1 Antibody Result 09/15/2022 Negative   Final    HLA Class 1 Antibody Comment 09/15/2022    Final    HLA Class 2 Antibody Result 09/15/2022 Positive   Final    HLA Class 2 Antibodies Identified 09/15/2022 [id number]  DPB1:1, 2   Final    HLA Class 2 Antibody Comment 09/15/2022    Final    Donor ID 09/15/2022 [id number]   Final    Donor HLA-A Antigen #1 09/15/2022 A30   Final    Anti-Donor HLA-A #1 MFI 09/15/2022 121  <1000 MFI Final    Donor HLA-A Antigen #2 09/15/2022 A74   Final    Anti-Donor HLA-A #2 MFI 09/15/2022 28  <1000 MFI Final    Donor HLA-B Antigen #1 09/15/2022 B42   Final    Anti-Donor HLA-B #1 MFI 09/15/2022 31  <1000 MFI Final    Donor HLA-B Antigen #2 09/15/2022 B57   Final    Anti-Donor HLA-B #2 MFI 09/15/2022 73  <1000 MFI Final    Donor HLA-C Antigen #1 09/15/2022 C17   Final    Anti-Donor HLA-C #1 MFI 09/15/2022 93  <1000 MFI Final    Donor HLA-C Antigen #2 09/15/2022 C18   Final    Anti-Donor HLA-C #2 MFI 09/15/2022 141  <1000 MFI Final    Donor HLA-DR Antigen #1 09/15/2022 DR18   Final    Anti-Donor HLA-DR #1 MFI 09/15/2022 28  <1000 MFI Final    Donor HLA-DR Antigen #2 09/15/2022 DR16   Final    Anti-Donor HLA-DR #2 MFI 09/15/2022 22  <1000 MFI Final    Donor DRw Antigen #1 09/15/2022 DR52   Final    Anti-Donor DRw #1 MFI 09/15/2022 177  <1000 MFI Final    Donor DRw Antigen #2 09/15/2022 DR51   Final    Anti-Donor DRw #  2 MFI 09/15/2022 223  <1000 MFI Final    Donor HLA-DQB Antigen #1 09/15/2022 DQ4   Final    Anti-Donor HLA-DQB #1 MFI 09/15/2022 74  <1000 MFI Final    Donor HLA-DQB Antigen #2 09/15/2022 DQ5   Final    Anti-Donor HLA-DQB #2 MFI 09/15/2022 50  <1000 MFI Final    DSA Comment 09/15/2022 Unable to determine DP DSA status due to unavailability of donor DP HLA type.   Final    TSH 09/15/2022 1.825  0.550 - 4.780 uIU/mL Final    Vitamin D Total (25OH) 09/15/2022 35.4  20.0 - 80.0 ng/mL Final    Hemoglobin A1C 09/15/2022 6.4 (H)  4.8 - 5.6 % Final    Estimated Average Glucose 09/15/2022 137  mg/dL Final    Triglycerides 09/15/2022 127  0 - 150 mg/dL Final    Cholesterol 16/06/9603 144  <=200 mg/dL Final    HDL 54/05/8118 41  40 - 60 mg/dL Final    LDL Calculated 09/15/2022 78  40 - 99 mg/dL Final    VLDL Cholesterol Cal 09/15/2022 25.4  12 - 42 mg/dL Final    Chol/HDL Ratio 09/15/2022 3.5  1.0 - 4.5 Final    Non-HDL Cholesterol 09/15/2022 103  70 - 130 mg/dL Final    FASTING 14/78/2956 Yes   Final    Magnesium 09/15/2022 1.9  1.6 - 2.6 mg/dL Final    Sodium 21/30/8657 141  135 - 145 mmol/L Final    Potassium 09/15/2022 4.4  3.5 - 5.1 mmol/L Final    Chloride 09/15/2022 109 (H) 98 - 107 mmol/L Final    CO2 09/15/2022 22.0  20.0 - 31.0 mmol/L Final    Anion Gap 09/15/2022 10  5 - 14 mmol/L Final    BUN 09/15/2022 26 (H)  9 - 23 mg/dL Final    Creatinine 84/69/6295 1.31 (H)  0.73 - 1.18 mg/dL Final    BUN/Creatinine Ratio 09/15/2022 20   Final    eGFR CKD-EPI (2021) Male 09/15/2022 60  >=60 mL/min/1.67m2 Final    Glucose 09/15/2022 139 (H)  70 - 99 mg/dL Final    Calcium 28/41/3244 9.9  8.7 - 10.4 mg/dL Final    Albumin 09/08/7251 3.9  3.4 - 5.0 g/dL Final    Total Protein 09/15/2022 6.9  5.7 - 8.2 g/dL Final    Total Bilirubin 09/15/2022 0.9  0.3 - 1.2 mg/dL Final    AST 66/44/0347 12  <=34 U/L Final    ALT 09/15/2022 15  10 - 49 U/L Final    Alkaline Phosphatase 09/15/2022 89  46 - 116 U/L Final    WBC 09/15/2022 9.3  3.6 - 11.2 10*9/L Final    RBC 09/15/2022 4.34  4.26 - 5.60 10*12/L Final    HGB 09/15/2022 13.0  12.9 - 16.5 g/dL Final    HCT 42/59/5638 38.7 (L)  39.0 - 48.0 % Final    MCV 09/15/2022 89.0  77.6 - 95.7 fL Final    MCH 09/15/2022 30.0  25.9 - 32.4 pg Final    MCHC 09/15/2022 33.7  32.0 - 36.0 g/dL Final    RDW 75/64/3329 15.5 (H)  12.2 - 15.2 % Final    MPV 09/15/2022 7.4  6.8 - 10.7 fL Final    Platelet 09/15/2022 301  150 - 450 10*9/L Final    Neutrophils % 09/15/2022 81.9  % Final    Lymphocytes % 09/15/2022 11.0  % Final    Monocytes % 09/15/2022 5.8  % Final  Eosinophils % 09/15/2022 0.8  % Final    Basophils % 09/15/2022 0.5  % Final    Absolute Neutrophils 09/15/2022 7.6  1.8 - 7.8 10*9/L Final    Absolute Lymphocytes 09/15/2022 1.0 (L)  1.1 - 3.6 10*9/L Final    Absolute Monocytes 09/15/2022 0.5  0.3 - 0.8 10*9/L Final    Absolute Eosinophils 09/15/2022 0.1  0.0 - 0.5 10*9/L Final    Absolute Basophils 09/15/2022 0.0  0.0 - 0.1 10*9/L Final         Immunosuppression:   Tac: 1 mg in the AM and 0.5 mg in the PM  Tac: 5-8    Changes: None  Next Labs: 3 Months  RTC: 03/2023 with Dr. Cherly Hensen

## 2022-09-28 ENCOUNTER — Telehealth: Payer: Medicare HMO

## 2022-09-29 ENCOUNTER — Ambulatory Visit: Payer: Medicare HMO | Admitting: Pharmacist

## 2022-10-03 ENCOUNTER — Other Ambulatory Visit: Payer: Self-pay | Admitting: Family Medicine

## 2022-10-04 DIAGNOSIS — Z941 Heart transplant status: Principal | ICD-10-CM

## 2022-10-05 NOTE — Progress Notes (Unsigned)
Benito Mccreedy D.[place] Phone: [phone]   Assessment and Plan:     There are no diagnoses linked to this encounter.  ***   Pertinent previous records reviewed include ***   Follow Up: ***     Subjective:   I, [name], am serving as a Education administrator for Doctor Glennon Mac   Chief Complaint: left shoulder pain    HPI:    07/05/2022 Patient is a 67 year old male complaining of left shoulder pain. Patient states since the end of June he has been in pain , he was reaching behind him and he felt immediate pain , decreased ROM , some numbness or tingling, no meds , pain radiates down the elbow up the neck and to the shoulder blade , sometimes pain down to hs fingers    08/04/2022 Patient states he is doing just fine    08/25/2022 Patient states doesn't hurt as much still decreased mobility    10/06/2022 Patient states    Relevant Historical Information: GERD, DM type II, hypothyroidism, history of heart transplant  Additional pertinent review of systems negative.   Current Outpatient Medications:    ACCU-CHEK GUIDE test strip, USE 1  4 TIMES DAILY, Disp: 100 each, Rfl: 0   amLODipine (NORVASC) 10 MG tablet, Take 10 mg by mouth daily. Per [facility], Disp: , Rfl:    aspirin EC 81 MG tablet, Take 1 tablet (81 mg total) by mouth daily., Disp: 30 tablet, Rfl: 0   atorvastatin (LIPITOR) 80 MG tablet, Take 1 tablet (80 mg total) by mouth daily at 6 PM., Disp: 30 tablet, Rfl: 0   blood glucose meter kit and supplies KIT, Dispense based on patient and insurance preference. Use up to four times daily as directed., Disp: 1 each, Rfl: 0   chlorthalidone (HYGROTON) 25 MG tablet, Take 1 tablet (25 mg total) by mouth daily., Disp: 30 tablet, Rfl: 0   cholecalciferol (VITAMIN D) 1000 units tablet, Take 3 tablets (3,000 Units total) by mouth daily., Disp: 90 tablet, Rfl: 0   diltiazem (TIAZAC) 180 MG 24 hr  capsule, Take 2 capsules (360 mg total) by mouth daily., Disp: 60 capsule, Rfl: 0   ezetimibe (ZETIA) 10 MG tablet, Take 1 tablet by mouth once daily, Disp: 90 tablet, Rfl: 0   famotidine (PEPCID) 20 MG tablet, Take 20 mg by mouth in the morning and at bedtime., Disp: , Rfl:    furosemide (LASIX) 20 MG tablet, Take 1 tablet (20 mg total) by mouth 2 (two) times daily for 3 days. (Patient not taking: Reported on 06/30/2022), Disp: 6 tablet, Rfl: 0   levothyroxine (SYNTHROID, LEVOTHROID) 75 MCG tablet, Take 1 tablet (75 mcg total) by mouth daily before breakfast., Disp: 30 tablet, Rfl: 0   magnesium oxide (MAG-OX) 400 MG tablet, Take 1.5 tablets (600 mg total) by mouth 2 (two) times daily., Disp: 45 tablet, Rfl: 0   metFORMIN (GLUCOPHAGE) 1000 MG tablet, TAKE 1 TABLET BY MOUTH TWICE DAILY WITH MEALS, Disp: 180 tablet, Rfl: 0   mycophenolate (CELLCEPT) 500 MG tablet, Take 1 tablet (500 mg total) by mouth 2 (two) times daily., Disp: 60 tablet, Rfl: 0   tacrolimus (PROGRAF) 0.5 MG capsule, Take 2 capsules (1 mg total) by mouth 2 (two) times daily. (Patient taking differently: Take by mouth 2 (two) times daily. 2 tablets in the AM 1 tablet in the PM), Disp: 120 capsule, Rfl: 0   tamsulosin (  FLOMAX) 0.4 MG CAPS capsule, Take 0.4 mg by mouth., Disp: , Rfl:    valsartan (DIOVAN) 160 MG tablet, Take 320 mg by mouth daily. , Disp: , Rfl:    Objective:     There were no vitals filed for this visit.    There is no height or weight on file to calculate BMI.    Physical Exam:    ***   Electronically signed by:  Benito Mccreedy D.[facility] 12:05 PM 10/05/22

## 2022-10-06 ENCOUNTER — Ambulatory Visit: Payer: Medicare HMO | Admitting: Sports Medicine

## 2022-10-06 VITALS — BP 122/78 | HR 64 | Ht 68.0 in | Wt 225.0 lb

## 2022-10-06 DIAGNOSIS — M7500 Adhesive capsulitis of unspecified shoulder: Secondary | ICD-10-CM

## 2022-10-06 DIAGNOSIS — M25512 Pain in left shoulder: Secondary | ICD-10-CM

## 2022-10-06 DIAGNOSIS — G8929 Other chronic pain: Secondary | ICD-10-CM | POA: Diagnosis not present

## 2022-10-06 DIAGNOSIS — M19012 Primary osteoarthritis, left shoulder: Secondary | ICD-10-CM

## 2022-10-06 DIAGNOSIS — M7582 Other shoulder lesions, left shoulder: Secondary | ICD-10-CM

## 2022-10-06 DIAGNOSIS — E11618 Type 2 diabetes mellitus with other diabetic arthropathy: Secondary | ICD-10-CM

## 2022-10-06 NOTE — Patient Instructions (Signed)
Good to see you Continue HEP   3 month follow up

## 2022-10-13 NOTE — Unmapped (Signed)
[facility] Refill Coordination Note    Specialty Medication(s) to be Shipped:   Transplant: tacrolimus 0.5mg     Other medication(s) to be shipped: No additional medications requested for fill at this time     Andrew Dickerson, DOB: [birth year]  Phone: There are no phone numbers on file.      All above HIPAA information was verified with patient.     Was a Nurse, learning disability used for this call? No    Completed refill call assessment today to schedule patient's medication shipment from the [facility] [phone]).  All relevant notes have been reviewed.     Specialty medication(s) and dose(s) confirmed: Regimen is correct and unchanged.   Changes to medications: Andrew Dickerson reports no changes at this time.  Changes to insurance: No  New side effects reported not previously addressed with a pharmacist or physician: None reported  Questions for the pharmacist: No    Confirmed patient received a Conservation officer, historic buildings and a Surveyor, mining with first shipment. The patient will receive a drug information handout for each medication shipped and additional FDA Medication Guides as required.       DISEASE/MEDICATION-SPECIFIC INFORMATION        N/A    SPECIALTY MEDICATION ADHERENCE     Medication Adherence    Patient reported X missed doses in the last month: 0  Specialty Medication: Tacrolimus 0.5mg   Patient is on additional specialty medications: No                                Were doses missed due to medication being on hold? No    Tacrolimus 0.5 mg: 13 days of medicine on hand      REFERRAL TO PHARMACIST     Referral to the pharmacist: Not needed      [facility]     Shipping address confirmed in Epic.     Delivery Scheduled: Yes, Expected medication delivery date: 10/20/22.     Medication will be delivered via UPS to the prescription address in Epic WAM.    [name], [facility]   [facility] Specialty Pharmacist

## 2022-10-19 MED FILL — TACROLIMUS 0.5 MG CAPSULE, IMMEDIATE-RELEASE: ORAL | 30 days supply | Qty: 90 | Fill #7

## 2022-11-01 ENCOUNTER — Other Ambulatory Visit: Payer: Self-pay | Admitting: Family Medicine

## 2022-11-01 DIAGNOSIS — R7309 Other abnormal glucose: Secondary | ICD-10-CM

## 2022-11-11 ENCOUNTER — Telehealth: Payer: Self-pay | Admitting: Pharmacist

## 2022-11-11 MED ORDER — VALSARTAN 160 MG TABLET
ORAL_TABLET | Freq: Every day | ORAL | 3 refills | 90 days | Status: CP
Start: 2022-11-11 — End: 2023-11-11
  Filled 2022-11-16: qty 180, 90d supply, fill #0

## 2022-11-11 NOTE — Progress Notes (Signed)
Care Management & Coordination Services Pharmacy Team  Reason for Encounter: Diabetes  Contacted patient to discuss diabetes disease state. Spoke with patient on 11/12/2022   Current antihyperglycemic regimen:  Metformin 1000 mg twice daily   Patient verbally confirms he is taking the above medications as directed. Yes  What diet changes have been made to improve diabetes control? No diet changes per patient  What recent interventions/DTPs have been made to improve glycemic control:  No recent interventions or DTPs.  Have there been any recent hospitalizations or ED visits since last visit with PharmD? No  Patient denies hypoglycemic symptoms.  Patient denies hyperglycemic symptoms.  How often are you checking your blood sugar? once daily  What are your blood sugars ranging?  Fasting: 110's  During the week, how often does your blood glucose drop below 70? Never  Are you checking your feet daily/regularly? Yes  Adherence Review: Is the patient currently on a STATIN medication? Yes Is the patient currently on ACE/ARB medication? Yes Does the patient have >5 day gap between last estimated fill dates? No   Chart Updates:  Recent office visits:  None since last Diabetes Adherence Call  Recent consult visits:  10/06/2022 OV (Sports Medicine) Glennon Mac, DO; no medication changes indicated.  08/25/2022 OV (Sports Medicine) Glennon Mac, DO; no medication changes indicated.  08/04/2022 OV (Sports Medicine) Glennon Mac, DO; no medication changes indicated.  Hospital visits:  None since last Diabetes Adherence Call  Medications: Outpatient Encounter Medications as of 11/11/2022  Medication Sig   ACCU-CHEK GUIDE test strip USE 1  4 TIMES DAILY   amLODipine (NORVASC) 10 MG tablet Take 10 mg by mouth daily. Per [facility]   aspirin EC 81 MG tablet Take 1 tablet (81 mg total) by mouth daily.   atorvastatin (LIPITOR) 80 MG tablet Take 1 tablet (80 mg total) by mouth  daily at 6 PM.   blood glucose meter kit and supplies KIT Dispense based on patient and insurance preference. Use up to four times daily as directed.   chlorthalidone (HYGROTON) 25 MG tablet Take 1 tablet (25 mg total) by mouth daily.   cholecalciferol (VITAMIN D) 1000 units tablet Take 3 tablets (3,000 Units total) by mouth daily.   diltiazem (TIAZAC) 180 MG 24 hr capsule Take 2 capsules (360 mg total) by mouth daily.   ezetimibe (ZETIA) 10 MG tablet Take 1 tablet by mouth once daily   famotidine (PEPCID) 20 MG tablet Take 20 mg by mouth in the morning and at bedtime.   furosemide (LASIX) 20 MG tablet Take 1 tablet (20 mg total) by mouth 2 (two) times daily for 3 days. (Patient not taking: Reported on 06/30/2022)   levothyroxine (SYNTHROID, LEVOTHROID) 75 MCG tablet Take 1 tablet (75 mcg total) by mouth daily before breakfast.   magnesium oxide (MAG-OX) 400 MG tablet Take 1.5 tablets (600 mg total) by mouth 2 (two) times daily.   metFORMIN (GLUCOPHAGE) 1000 MG tablet TAKE 1 TABLET BY MOUTH TWICE DAILY WITH MEALS   mycophenolate (CELLCEPT) 500 MG tablet Take 1 tablet (500 mg total) by mouth 2 (two) times daily.   tacrolimus (PROGRAF) 0.5 MG capsule Take 2 capsules (1 mg total) by mouth 2 (two) times daily. (Patient taking differently: Take by mouth 2 (two) times daily. 2 tablets in the AM 1 tablet in the PM)   tamsulosin (FLOMAX) 0.4 MG CAPS capsule Take 0.4 mg by mouth.   valsartan (DIOVAN) 160 MG tablet Take 320 mg by mouth daily.  No facility-administered encounter medications on file as of 11/11/2022.    Recent Relevant Labs: Lab Results  Component Value Date/Time   HGBA1C 6.0 (A) 06/30/2022 10:57 AM   HGBA1C 6.4 12/10/2021 12:00 AM   HGBA1C 6.0 04/24/2021 12:00 AM    Kidney Function Lab Results  Component Value Date/Time   CREATININE 1.37 03/30/2022 11:40 AM   CREATININE 1.4 (A) 12/14/2021 12:00 AM   CREATININE 1.6 (A) 04/24/2021 12:00 AM   CREATININE 1.35 12/17/2020 11:33 AM    GFR 54.22 (L) 03/30/2022 11:40 AM   GFRNONAA 48 10/22/2020 12:00 AM   GFRAA 56 10/22/2020 12:00 AM    Star Rating Drugs:  Atorvastatin 80 mg last filled 08/09/2022 90 DS Metformin 1000 mg last filled 11/01/2022 90 DS Valsartan 160 mg last filled 08/09/2022 90 DS   Care Gaps: Annual wellness visit in last year? Yes Last eye exam / retinopathy screening: 06/20/2020 Last diabetic foot exam: 06/18/2021  Future Appointments  Date Time Provider [place]  01/04/2023 11:00 AM Glennon Mac, DO [facility] None  01/06/2023 10:00 AM Marin Olp, MD [facility]  02/15/2023 11:15 AM Edythe Clarity, [place] None    April D Calhoun, [place] Pharmacist Assistant [phone]

## 2022-11-11 NOTE — Unmapped (Signed)
[facility] Refill Coordination Note    Specialty Medication(s) to be Shipped:   Transplant: mycophenolate mofetil 500mg  and tacrolimus 0.5mg     Other medication(s) to be shipped:     Chlorthalidone  Diltiazem  Levothyroxine  Amlodipine  Asa  Atorvastatin  Famotidine  Valsartan       Andrew Dickerson, DOB: [birth year]  Phone: There are no phone numbers on file.      All above HIPAA information was verified with patient.     Was a Nurse, learning disability used for this call? No    Completed refill call assessment today to schedule patient's medication shipment from the [facility] [phone]).  All relevant notes have been reviewed.     Specialty medication(s) and dose(s) confirmed: Regimen is correct and unchanged.   Changes to medications: Denell reports no changes at this time.  Changes to insurance: No  New side effects reported not previously addressed with a pharmacist or physician: None reported  Questions for the pharmacist: No    Confirmed patient received a Conservation officer, historic buildings and a Surveyor, mining with first shipment. The patient will receive a drug information handout for each medication shipped and additional FDA Medication Guides as required.       DISEASE/MEDICATION-SPECIFIC INFORMATION        N/A    SPECIALTY MEDICATION ADHERENCE     Medication Adherence    Patient reported X missed doses in the last month: 0  Specialty Medication: tacrolimus 0.5 MG capsule (PROGRAF)  Patient is on additional specialty medications: Yes  Additional Specialty Medications: mycophenolate 500 mg tablet (CELLCEPT)  Patient Reported Additional Medication X Missed Doses in the Last Month: 0  Patient is on more than two specialty medications: No          mycophenolate mofetil 500mg    8 days worth of medication on hand.  tacrolimus 0.5mg    8 days worth of medication on hand.      Were doses missed due to medication being on hold? No      REFERRAL TO PHARMACIST     Referral to the pharmacist: Not needed      [facility]     Shipping address confirmed in Epic.     Patient was notified of new phone menu : No    Delivery Scheduled: Yes, Expected medication delivery date: 11/16/22.     Medication will be delivered via UPS to the prescription address in Epic WAM.    [place] A Tuwanda Vokes   [facility] Shared [facility] Specialty Technician

## 2022-11-16 MED FILL — DILTIAZEM CD 180 MG CAPSULE,EXTENDED RELEASE 24 HR: ORAL | 30 days supply | Qty: 60 | Fill #3

## 2022-11-16 MED FILL — MYCOPHENOLATE MOFETIL 500 MG TABLET: ORAL | 90 days supply | Qty: 180 | Fill #1

## 2022-11-16 MED FILL — CHLORTHALIDONE 25 MG TABLET: ORAL | 30 days supply | Qty: 30 | Fill #3

## 2022-11-16 MED FILL — LEVOTHYROXINE 75 MCG TABLET: ORAL | 30 days supply | Qty: 30 | Fill #3

## 2022-11-16 MED FILL — ATORVASTATIN 80 MG TABLET: ORAL | 90 days supply | Qty: 90 | Fill #3

## 2022-11-16 MED FILL — AMLODIPINE 10 MG TABLET: ORAL | 90 days supply | Qty: 90 | Fill #1

## 2022-11-16 MED FILL — ASPIRIN 81 MG TABLET,DELAYED RELEASE: ORAL | 90 days supply | Qty: 90 | Fill #1

## 2022-11-16 MED FILL — FAMOTIDINE 20 MG TABLET: ORAL | 90 days supply | Qty: 180 | Fill #1

## 2022-11-16 MED FILL — TACROLIMUS 0.5 MG CAPSULE, IMMEDIATE-RELEASE: ORAL | 30 days supply | Qty: 90 | Fill #8

## 2022-12-10 NOTE — Unmapped (Signed)
[facility] Refill Coordination Note    Specialty Medication(s) to be Shipped:   Transplant: tacrolimus 0.5mg     Other medication(s) to be shipped:  chlorthalidone , diltiazem and levothyroxine     Andrew Dickerson, DOB: [birth year]  Phone: There are no phone numbers on file.      All above HIPAA information was verified with patient.     Was a Nurse, learning disability used for this call? No    Completed refill call assessment today to schedule patient's medication shipment from the [facility] [phone]).  All relevant notes have been reviewed.     Specialty medication(s) and dose(s) confirmed: Regimen is correct and unchanged.   Changes to medications: [name] reports no changes at this time.  Changes to insurance: No  New side effects reported not previously addressed with a pharmacist or physician: None reported  Questions for the pharmacist: No    Confirmed patient received a Conservation officer, historic buildings and a Surveyor, mining with first shipment. The patient will receive a drug information handout for each medication shipped and additional FDA Medication Guides as required.       DISEASE/MEDICATION-SPECIFIC INFORMATION        N/A    SPECIALTY MEDICATION ADHERENCE     Medication Adherence    Patient reported X missed doses in the last month: 0  Specialty Medication: tacrolimus 0.5 MG capsule (PROGRAF)  Patient is on additional specialty medications: No              Were doses missed due to medication being on hold? No    tacrolimus 0.5 mg: 10 days of medicine on hand       REFERRAL TO PHARMACIST     Referral to the pharmacist: Not needed      [facility]     Shipping address confirmed in Epic.     Patient was notified of new phone menu : No    Delivery Scheduled: Yes, Expected medication delivery date: 12/16/22.     Medication will be delivered via UPS to the prescription address in Epic WAM.    Quintella Reichert   [facility] Specialty Technician

## 2022-12-15 MED FILL — DILTIAZEM CD 180 MG CAPSULE,EXTENDED RELEASE 24 HR: ORAL | 30 days supply | Qty: 60 | Fill #4

## 2022-12-15 MED FILL — TACROLIMUS 0.5 MG CAPSULE, IMMEDIATE-RELEASE: ORAL | 30 days supply | Qty: 90 | Fill #9

## 2022-12-15 MED FILL — LEVOTHYROXINE 75 MCG TABLET: ORAL | 30 days supply | Qty: 30 | Fill #4

## 2022-12-15 MED FILL — CHLORTHALIDONE 25 MG TABLET: ORAL | 30 days supply | Qty: 30 | Fill #4

## 2022-12-21 ENCOUNTER — Other Ambulatory Visit: Payer: Self-pay | Admitting: Family Medicine

## 2022-12-24 NOTE — Unmapped (Signed)
Called pt to have labs drawn next week per [facility].

## 2022-12-29 ENCOUNTER — Other Ambulatory Visit: Payer: Self-pay | Admitting: Family Medicine

## 2022-12-29 DIAGNOSIS — R7989 Other specified abnormal findings of blood chemistry: Secondary | ICD-10-CM | POA: Diagnosis not present

## 2022-12-29 DIAGNOSIS — Z48298 Encounter for aftercare following other organ transplant: Secondary | ICD-10-CM | POA: Diagnosis not present

## 2022-12-29 DIAGNOSIS — Z79899 Other long term (current) drug therapy: Secondary | ICD-10-CM | POA: Diagnosis not present

## 2022-12-29 DIAGNOSIS — Z941 Heart transplant status: Secondary | ICD-10-CM | POA: Diagnosis not present

## 2023-01-03 LAB — BASIC METABOLIC PANEL
BLOOD UREA NITROGEN: 27 mg/dL — ABNORMAL HIGH (ref 7–25)
BUN / CREAT RATIO: 22 (ref 6–22)
CALCIUM: 9.7 mg/dL (ref 8.6–10.3)
CHLORIDE: 105 mmol/L (ref 98–110)
CO2: 19 mmol/L — ABNORMAL LOW (ref 20–32)
CREATININE: 1.25 mg/dL (ref 0.70–1.35)
EGFR CKD-EPI AA MALE: 64 mL/min/{1.73_m2}
GLUCOSE RANDOM: 116 mg/dL — ABNORMAL HIGH (ref 65–99)
MAGNESIUM: 1.6 mg/dL (ref 1.5–2.5)
POTASSIUM: 4.9 mmol/L (ref 3.5–5.3)
SODIUM: 134 mmol/L — ABNORMAL LOW (ref 135–146)

## 2023-01-03 LAB — CBC W/ DIFFERENTIAL
BASOPHILS ABSOLUTE COUNT: 53 {cells}/uL (ref 0–200)
BASOPHILS RELATIVE PERCENT: 0.7 %
EOSINOPHILS ABSOLUTE COUNT: 68 {cells}/uL (ref 15–500)
EOSINOPHILS RELATIVE PERCENT: 0.9 %
HEMATOCRIT: 38.1 % — ABNORMAL LOW (ref 38.5–50.0)
LYMPHOCYTES ABSOLUTE COUNT: 968 {cells}/uL (ref 850–3900)
LYMPHOCYTES RELATIVE PERCENT: 12.9 %
MEAN CORPUSCULAR HEMOGLOBIN CONC: 33.1 g/dL (ref 32.0–36.0)
MEAN CORPUSCULAR HEMOGLOBIN: 29.6 pg (ref 27.0–33.0)
MEAN CORPUSCULAR VOLUME: 89.4 fL (ref 80.0–100.0)
MEAN PLATELET VOLUME: 10.2 fL (ref 7.5–12.5)
MONOCYTES ABSOLUTE COUNT: 608 {cells}/uL (ref 200–950)
MONOCYTES RELATIVE PERCENT: 8.1 %
NEUTROPHILS ABSOLUTE COUNT: 5805 {cells}/uL (ref 1500–7800)
NEUTROPHILS RELATIVE PERCENT: 77.4 %
PLATELET COUNT: 269 (ref 140–400)
RED BLOOD CELL COUNT: 4.26 10*6/uL (ref 4.20–5.80)
RED CELL DISTRIBUTION WIDTH: 12.4 % (ref 11.0–15.0)
WHITE BLOOD CELL COUNT: 7.5 10*3/uL (ref 3.8–10.8)

## 2023-01-03 LAB — TACROLIMUS LEVEL, TROUGH: TACROLIMUS, TROUGH: 6.9 ug/L (ref 5.0–20.0)

## 2023-01-03 NOTE — Progress Notes (Unsigned)
[name] D.[facility] [address] [place] [address] Phone: [phone]   Assessment and Plan:     There are no diagnoses linked to this encounter.  ***   Pertinent previous records reviewed include ***   Follow Up: ***     Subjective:   I, [name], am serving as a Neurosurgeon for Doctor Richardean Sale   Chief Complaint: left shoulder pain    HPI:    07/05/2022 [name] is a 67 year old male complaining of left shoulder pain. [name] states since the end of June he has been in pain , he was reaching behind him and he felt immediate pain , decreased ROM , some numbness or tingling, no meds , pain radiates down the elbow up the neck and to the shoulder blade , sometimes pain down to hs fingers    08/04/2022 [name] states he is doing just fine    08/25/2022 [name] states doesn't hurt as much still decreased mobility    10/06/2022 [name] states he is doing just fine    01/04/2023 [name] states    Relevant Historical Information: GERD, DM type II, hypothyroidism, history of heart transplant  Additional pertinent review of systems negative.   Current Outpatient Medications:    ACCU-CHEK GUIDE test strip, USE 1 TEST STRIP  4 TIMES DAILY, Disp: 100 each, Rfl: 0   amLODipine (NORVASC) 10 MG tablet, Take 10 mg by mouth daily. Per [facility], Disp: , Rfl:    aspirin EC 81 MG tablet, Take 1 tablet (81 mg total) by mouth daily., Disp: 30 tablet, Rfl: 0   atorvastatin (LIPITOR) 80 MG tablet, Take 1 tablet (80 mg total) by mouth daily at 6 PM., Disp: 30 tablet, Rfl: 0   blood glucose meter kit and supplies KIT, Dispense based on [name] and insurance preference. Use up to four times daily as directed., Disp: 1 each, Rfl: 0   chlorthalidone (HYGROTON) 25 MG tablet, Take 1 tablet (25 mg total) by mouth daily., Disp: 30 tablet, Rfl: 0   cholecalciferol (VITAMIN D) 1000 units tablet, Take 3 tablets (3,000 Units total) by mouth daily.,  Disp: 90 tablet, Rfl: 0   diltiazem (TIAZAC) 180 MG 24 hr capsule, Take 2 capsules (360 mg total) by mouth daily., Disp: 60 capsule, Rfl: 0   ezetimibe (ZETIA) 10 MG tablet, Take 1 tablet by mouth once daily, Disp: 90 tablet, Rfl: 0   famotidine (PEPCID) 20 MG tablet, Take 20 mg by mouth in the morning and at bedtime., Disp: , Rfl:    furosemide (LASIX) 20 MG tablet, Take 1 tablet (20 mg total) by mouth 2 (two) times daily for 3 days. ([name] not taking: Reported on 06/30/2022), Disp: 6 tablet, Rfl: 0   levothyroxine (SYNTHROID, LEVOTHROID) 75 MCG tablet, Take 1 tablet (75 mcg total) by mouth daily before breakfast., Disp: 30 tablet, Rfl: 0   magnesium oxide (MAG-OX) 400 MG tablet, Take 1.5 tablets (600 mg total) by mouth 2 (two) times daily., Disp: 45 tablet, Rfl: 0   metFORMIN (GLUCOPHAGE) 1000 MG tablet, TAKE 1 TABLET BY MOUTH TWICE DAILY WITH MEALS, Disp: 180 tablet, Rfl: 0   mycophenolate (CELLCEPT) 500 MG tablet, Take 1 tablet (500 mg total) by mouth 2 (two) times daily., Disp: 60 tablet, Rfl: 0   tacrolimus (PROGRAF) 0.5 MG capsule, Take 2 capsules (1 mg total) by mouth 2 (two) times daily. ([name] taking differently: Take by mouth 2 (two) times daily. 2 tablets in the AM  1 tablet in the PM), Disp: 120 capsule, Rfl: 0   tamsulosin (FLOMAX) 0.4 MG CAPS capsule, Take 0.4 mg by mouth., Disp: , Rfl:    valsartan (DIOVAN) 160 MG tablet, Take 320 mg by mouth daily. , Disp: , Rfl:    Objective:     There were no vitals filed for this visit.    There is no height or weight on file to calculate BMI.    Physical Exam:    ***   Electronically signed by:  [name] D.[facility] 12:04 PM 01/03/23

## 2023-01-04 ENCOUNTER — Other Ambulatory Visit: Payer: Self-pay

## 2023-01-04 ENCOUNTER — Ambulatory Visit: Payer: Medicare HMO | Admitting: Sports Medicine

## 2023-01-04 VITALS — BP 122/68 | HR 85 | Ht 68.0 in | Wt 218.0 lb

## 2023-01-04 DIAGNOSIS — E11618 Type 2 diabetes mellitus with other diabetic arthropathy: Secondary | ICD-10-CM

## 2023-01-04 DIAGNOSIS — M25512 Pain in left shoulder: Secondary | ICD-10-CM | POA: Diagnosis not present

## 2023-01-04 DIAGNOSIS — G8929 Other chronic pain: Secondary | ICD-10-CM

## 2023-01-04 DIAGNOSIS — M19012 Primary osteoarthritis, left shoulder: Secondary | ICD-10-CM

## 2023-01-04 DIAGNOSIS — M7500 Adhesive capsulitis of unspecified shoulder: Secondary | ICD-10-CM | POA: Diagnosis not present

## 2023-01-04 LAB — CBC AND DIFFERENTIAL
HCT: 38 — AB (ref 41–53)
Platelets: 269 10*3/uL (ref 150–400)
WBC: 7.5

## 2023-01-04 LAB — COMPREHENSIVE METABOLIC PANEL
Calcium: 9.7 (ref 8.7–10.7)
eGFR: 64

## 2023-01-04 LAB — BASIC METABOLIC PANEL
BUN: 27 — AB (ref 4–21)
CO2: 19 (ref 13–22)
Chloride: 105 (ref 99–108)
Creatinine: 1.3 (ref 0.6–1.3)
Glucose: 116
Potassium: 4.9 mEq/L (ref 3.5–5.1)
Sodium: 134 — AB (ref 137–147)

## 2023-01-04 LAB — CBC: RBC: 4.26 (ref 3.87–5.11)

## 2023-01-04 NOTE — Patient Instructions (Addendum)
Good to see you Pt referral  5 week follow up

## 2023-01-04 NOTE — Unmapped (Addendum)
Discussed recent labs with [facility], [facility].  Plan is to Make No Changes  with repeat labs in 1 Month to monitor sodium level.     Sent patient MyChart message with lab results and follow up plan      Lab Results   Component Value Date    TACROLIMUS 6.9 12/29/2022     Goal: Tac: 5-8  Current Dose: 1 mg/0.5 mg    Lab Results   Component Value Date    BUN 27 (H) 12/29/2022    CREATININE 1.25 12/29/2022    K 4.9 12/29/2022    GLU 116 (H) 12/29/2022    MG 1.6 12/29/2022     Lab Results   Component Value Date    WBC 7.5 12/29/2022    HGB 13.0 09/15/2022    HCT 38.1 (L) 12/29/2022    PLT 269 12/29/2022    NEUTROABS 5,805 12/29/2022    EOSABS 68 12/29/2022

## 2023-01-06 ENCOUNTER — Ambulatory Visit (INDEPENDENT_AMBULATORY_CARE_PROVIDER_SITE_OTHER): Payer: Medicare HMO | Admitting: Family Medicine

## 2023-01-06 ENCOUNTER — Encounter: Payer: Self-pay | Admitting: Family Medicine

## 2023-01-06 VITALS — BP 122/60 | HR 86 | Temp 98.0°F | Ht 68.0 in | Wt 222.4 lb

## 2023-01-06 DIAGNOSIS — E119 Type 2 diabetes mellitus without complications: Secondary | ICD-10-CM

## 2023-01-06 DIAGNOSIS — I5022 Chronic systolic (congestive) heart failure: Secondary | ICD-10-CM

## 2023-01-06 DIAGNOSIS — D849 Immunodeficiency, unspecified: Secondary | ICD-10-CM | POA: Diagnosis not present

## 2023-01-06 DIAGNOSIS — R350 Frequency of micturition: Secondary | ICD-10-CM

## 2023-01-06 DIAGNOSIS — N401 Enlarged prostate with lower urinary tract symptoms: Secondary | ICD-10-CM | POA: Diagnosis not present

## 2023-01-06 DIAGNOSIS — Z941 Heart transplant status: Secondary | ICD-10-CM

## 2023-01-06 DIAGNOSIS — Z0000 Encounter for general adult medical examination without abnormal findings: Secondary | ICD-10-CM | POA: Diagnosis not present

## 2023-01-06 NOTE — Progress Notes (Signed)
Phone: [phone]   Subjective:  [name] presents today for their annual physical. Chief complaint-noted.   See problem oriented charting- ROS- full  review of systems was completed and negative  except for: sensation of esophagus irritation/closing up with cold liquids only- does ok with warm and foods- had recurrence about 4 days ago and thinks will get more regular again as the summer comes along and more cold bverages.   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/2009   on old heart   Blood transfusion without reported diagnosis    CHF (congestive heart failure) (HCC)    CORONARY ARTERY DISEASE 04/25/2007   DIABETES MELLITUS, TYPE II 12/13/2008   no meds, diet contolled   GERD (gastroesophageal reflux disease)    HYPERLIPIDEMIA 04/25/2007   Hypertension    HYPERTENSION, HX OF 05/14/2008   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   MYOCARDIAL INFARCTION, 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   OBESITY 02/26/2009   Thyroid disease    THYROID DISEASE, HX OF 05/14/2008   VENTRICULAR TACHYCARDIA 02/26/2009   [name] Active Problem List   Diagnosis Date Noted   Immunosuppressed status (HCC) 02/23/2019    Priority: High   Heart transplanted (HCC) 06/19/2014    Priority: High   Chronic systolic heart failure (HCC) 03/02/2009    Priority: High   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    Priority: High   AUTOMATIC IMPLANTABLE CARDIAC DEFIBRILLATOR SITU 02/26/2009    Priority: High   Diabetes mellitus type II, controlled (HCC) 12/13/2008    Priority: High   Coronary atherosclerosis 04/25/2007    Priority: High   Chronic abdominal pain 12/27/2015    Priority: Medium    BPH (benign prostatic hypertrophy) 02/26/2014    Priority: Medium    Hypothyroidism 05/14/2008     Priority: Medium    Essential hypertension 05/14/2008    Priority: Medium    ANEMIA, OTHER UNSPEC 04/03/2008    Priority: Medium    Hyperlipidemia associated with type 2 diabetes mellitus (HCC) 04/25/2007    Priority: Medium    GERD (gastroesophageal reflux disease) 06/19/2014    Priority: Low   Osteoarthritis 06/19/2014    Priority: Low   History of adenomatous polyp of colon 09/17/2013    Priority: Low   Past Surgical History:  Procedure Laterality Date   ANGIOPLASTY     with stent   CARDIAC ASSIST DEVICE REMOVAL     CARDIAC CATHETERIZATION     COLONOSCOPY     CORONARY ARTERY BYPASS GRAFT     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   LEFT VENTRICULAR ASSIST DEVICE  05/2009   [facility]    Family History  Problem Relation Age of Onset   Heart disease Mother    Lung cancer Maternal Grandmother    Lung cancer Maternal Grandfather    Colon cancer Neg Hx    Esophageal cancer Neg Hx    Prostate cancer Neg Hx    Rectal cancer Neg Hx    Stomach cancer Neg Hx     Medications- reviewed and updated Current Outpatient Medications  Medication Sig Dispense Refill   ACCU-CHEK GUIDE test strip USE 1 TEST STRIP  4 TIMES DAILY 100 each 0   amLODipine (NORVASC) 10 MG tablet Take 10 mg by mouth daily. Per [facility]     aspirin EC 81 MG  tablet Take 1 tablet (81 mg total) by mouth daily. 30 tablet 0   atorvastatin (LIPITOR) 80 MG tablet Take 1 tablet (80 mg total) by mouth daily at 6 PM. 30 tablet 0   blood glucose meter kit and supplies KIT Dispense based on [name] and insurance preference. Use up to four times daily as directed. 1 each 0   chlorthalidone (HYGROTON) 25 MG tablet Take 1 tablet (25 mg total) by mouth daily. 30 tablet 0   cholecalciferol (VITAMIN D) 1000 units tablet Take 3 tablets (3,000 Units total) by mouth daily. 90 tablet 0   diltiazem (TIAZAC) 180 MG 24 hr capsule Take 2 capsules (360 mg total) by mouth daily. 60 capsule 0   ezetimibe (ZETIA) 10 MG tablet Take 1 tablet by mouth once  daily 90 tablet 0   levothyroxine (SYNTHROID, LEVOTHROID) 75 MCG tablet Take 1 tablet (75 mcg total) by mouth daily before breakfast. 30 tablet 0   magnesium oxide (MAG-OX) 400 MG tablet Take 1.5 tablets (600 mg total) by mouth 2 (two) times daily. 45 tablet 0   metFORMIN (GLUCOPHAGE) 1000 MG tablet TAKE 1 TABLET BY MOUTH TWICE DAILY WITH MEALS 180 tablet 0   mycophenolate (CELLCEPT) 500 MG tablet Take 1 tablet (500 mg total) by mouth 2 (two) times daily. 60 tablet 0   tacrolimus (PROGRAF) 0.5 MG capsule Take 2 capsules (1 mg total) by mouth 2 (two) times daily. ([name] taking differently: Take by mouth 2 (two) times daily. 2 tablets in the AM 1 tablet in the PM) 120 capsule 0   tamsulosin (FLOMAX) 0.4 MG CAPS capsule Take 0.4 mg by mouth.     valsartan (DIOVAN) 160 MG tablet Take 320 mg by mouth daily.      famotidine (PEPCID) 20 MG tablet Take 20 mg by mouth in the morning and at bedtime.     No current facility-administered medications for this visit.    Allergies-reviewed and updated No Known Allergies  Social History   Social History Narrative   Single. Divorced. 1 son. 1 granddaughter 2014.       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       Hobbies: time on CPU            Objective  Objective:  BP 122/60   Pulse 86   Temp 98 F (36.7 C)   Ht 5\' 8"  (1.727 m)   Wt 222 lb 6.4 oz (100.9 kg)   SpO2 98%   BMI 33.82 kg/m  Gen: NAD, resting comfortably HEENT: Mucous membranes are moist. Oropharynx normal Neck: no thyromegaly CV: RRR no murmurs rubs or gallops Lungs: CTAB no crackles, wheeze, rhonchi Abdomen: soft/nontender/nondistended/normal bowel sounds. No rebound or guarding. Periumbilical hernia noted Ext: trace edema Skin: warm, dry Neuro: grossly normal, moves all extremities, PERRLA  Diabetic Foot Exam - Simple   Simple Foot Form Diabetic Foot exam was performed with the following findings: Yes 01/06/2023 10:24 AM  Visual Inspection No  deformities, no ulcerations, no other skin breakdown bilaterally: Yes Sensation Testing Intact to touch and monofilament testing bilaterally: Yes Pulse Check Posterior Tibialis and Dorsalis pulse intact bilaterally: Yes Comments      Assessment and Plan  67 y.o. male presenting for annual physical.  Health Maintenance counseling: 1. Anticipatory guidance: [name] counseled regarding regular dental exams - dentures, eye exams -yearly,  avoiding smoking and second hand smoke , limiting alcohol to 2 beverages per day - still not drinking unless very rare social,  no illicit drugs .   2. Risk factor reduction:  Advised [name] of need for regular exercise and diet rich and fruits and vegetables to reduce risk of heart attack and stroke.  Exercise- feels could improve- plans to restart walking even if 10 minutes 3 days a week with long term goal 30 minutes 5 days a week.  Diet/weight management-Down 8 pounds from last visit with me. Congratulated his efforts- tightned up his diet Wt Readings from Last 3 Encounters:  01/06/23 222 lb 6.4 oz (100.9 kg)  01/04/23 218 lb (98.9 kg)  10/06/22 225 lb (102.1 kg)  3. Immunizations/screenings/ancillary studies-up-to-date- other than offered Prevnar 20 but he reports he had that at [facility] soon for second Shingrix Immunization History  Administered Date(s) Administered   Fluad Quad(high Dose 65+) 06/30/2022   HIB (PRP-OMP) 05/02/2009   Hepatitis A, Adult 06/09/2012   Hepatitis B 09/14/2011   Hepatitis B, ADULT 05/08/2009, 09/14/2011, 06/09/2012   Influenza Split 07/02/2011   Influenza Whole 06/25/2008   Influenza, Seasonal, Injecte, Preservative Fre 08/06/2010, 06/09/2012   Influenza,inj,Quad PF,6+ Mos 08/07/2013, 06/19/2014, 08/29/2015, 08/20/2016, 06/07/2018, 05/22/2019, 06/16/2020   Influenza-Unspecified 06/19/2014, 06/16/2020   PFIZER(Purple Top)SARS-COV-2 Vaccination 11/04/2019, 12/01/2019, 10/22/2020   PPD Test 05/04/2009, 09/14/2011,  06/09/2012   Pfizer Covid-19 Vaccine Bivalent Booster 25yrs & up 06/16/2022   Pneumococcal Conjugate-13 07/18/2012   Pneumococcal Polysaccharide-23 09/06/2009, 06/09/2012, 02/23/2019   Pneumococcal-Unspecified 05/02/2009   Td 05/02/2009   Tdap 09/14/2011, 05/12/2022   Zoster Recombinat (Shingrix) 12/10/2022   Zoster, Live 12/16/2006  4. Prostate cancer screening- low risk prior PSA trend-continue to trend with labs.  Was checked 09/15/2022 at [facility] and 0.66 so we opted out of repeat Lab Results  Component Value Date   PSA 0.70 10/22/2020   PSA 0.5 01/27/2017   PSA 0.67 03/05/2008   5. Colon cancer screening - April 25, 2017 with 10-year repeat planned 6. Skin cancer screening-encouraged to schedule dermatology visit last year-good report within a year. advised regular sunscreen use. Denies worrisome, changing, or new skin lesions.  7. Smoking associated screening (lung cancer screening, AAA screen 65-75, UA)-former smoker- quit smoking in 1992 per his report past regular screening requirements other than AAA but he had this done 12/28/2015 with abdominal ultrasound and no aneurysm was noted  8. STD screening - not dating, no STD screening needed  Status of chronic or acute concerns   # Frozen shoulder associated with diabetes-recent injection on 01/04/2023 by Dr. Jean Rosenthal and referred back to physical therapy  #Heart transplant/immunosuppressed-[name] follows up closely with [facility] S: Medication: Remains on mycophenolate 500 mg twice daily  and tacrolimus 0.5 mg twice daily  - no chest pain or shortness of breath   A/P: stable- continue current medicines . Immunosuppressed status noted   #CAD  #hyperlipidemia S: Medication:Atorvastatin 80 mg, Zetia 10 mg, aspirin 81 mg  Lab Results  Component Value Date   CHOL 124 04/24/2021   HDL 33 (A) 12/10/2021   LDLCALC 63 12/10/2021   LDLDIRECT 69 02/12/2014   TRIG 135 12/10/2021   TRIG 118 12/10/2021   CHOLHDL 5.2 CALC 03/05/2008  A/P: CAD-  asymptomatic continue current medications  Hyperlipidemia-Low Density Lipoprotein (LDL cholesterol) 78 on 09/15/22- he is working on healthy eating/improving exercise to bring this down   # Diabetes-most recent A1c 6.4 on 09/15/2022 S: Medication:Metformin 1000 mg twice daily Lab Results  Component Value Date   HGBA1C 6.0 (A) 06/30/2022   HGBA1C 6.4 12/10/2021   HGBA1C 6.0 04/24/2021  A/P: wants to wait  on recheck until august- for now keep working on healthy eating and regular exercise and continue metformin -can't urinate today for microalb/creatinine ratio   #CHF  #hypertension S: medication: Valsartan 320 mg, chlorthalidone 25 mg, amlodipine 10 mg, diltiazem 360 mg extended release -Lasix usage: not needing at this time  BP Readings from Last 3 Encounters:  01/06/23 122/60  01/04/23 122/68  10/06/22 122/78  A/P: CHF euvolemic continue current medications Hypertension stable- continue current medicines    #hypothyroidism-TSH 1.25 on 09/15/2022 S: compliant On thyroid medication-levothyroxine 75 mcg A/P:stable- continue current medicines     #BPH S: Medication: Tamsulosin 0.4 mg . Feels urgency and frequency worsening- wants to see urology back A/P: refer back to urology- prior Dr. Patsi Sears   #GERD-Pepcid controls for the most part. See ROS - cold beverage gives sensation of something getting stuck but no issue with food or warm beverages- in fact warm beverage resolves issue- discusse dpossible gastroenterology consult- he wants to hold for now but will let me know if worsening  #recent BMP with slightly low sodium but [facility] plans to repeat  Recommended follow up: Return in about 6 months (around 07/09/2023) for followup or sooner if needed.Schedule b4 you leave. Future Appointments  Date Time Provider Department Center  02/08/2023 10:30 AM Richardean Sale, DO [facility] None  02/15/2023 11:15 AM Willa Frater L, RPH [facility] None    Lab/Order associations: already had labs    ICD-10-CM   1. Preventative health care  Z00.00     2. Heart transplant status (HCC) Chronic Z94.1     3. Immunosuppressed status (HCC) Chronic D84.9     4. Chronic systolic heart failure (HCC) Chronic I50.22     5. Benign prostatic hyperplasia with urinary frequency  N40.1 Ambulatory referral to Urology   R35.0     6. Controlled type 2 diabetes mellitus without complication, without long-term current use of insulin (HCC)  E11.9       No orders of the defined types were placed in this encounter.   Return precautions advised.  Tana Conch, MD

## 2023-01-06 NOTE — Patient Instructions (Addendum)
Team please e request his last diabetic eye exam- reports had in the last year  Team see if he has prevnar 20 on ncir and add to our list if so   Let us know if you get any other COVID vaccines.  - plans to restart walking even if 10 minutes 3 days a week with long term goal 30 minutes 5 days a week.   We have placed a referral for you today to urology. In some cases you will see # listed below- you can call this if you have not heard within a week. If you do not see # listed- you should receive a mychart message or phone call within a week with the # to call.

## 2023-01-06 NOTE — Unmapped (Signed)
[facility] Refill Coordination Note    Specialty Medication(s) to be Shipped:   Transplant: tacrolimus 0.5mg     Other medication(s) to be shipped:  chlorthalidone, diltiazem, levothyroxine     Andrew Dickerson, DOB: 05-10-1956  Phone: There are no phone numbers on file.      All above HIPAA information was verified with patient.     Was a Nurse, learning disability used for this call? No    Completed refill call assessment today to schedule patient's medication shipment from the [facility] [phone]).  All relevant notes have been reviewed.     Specialty medication(s) and dose(s) confirmed: Regimen is correct and unchanged.   Changes to medications: [place] reports no changes at this time.  Changes to insurance: No  New side effects reported not previously addressed with a pharmacist or physician: None reported  Questions for the pharmacist: No    Confirmed patient received a Conservation officer, historic buildings and a Surveyor, mining with first shipment. The patient will receive a drug information handout for each medication shipped and additional FDA Medication Guides as required.       DISEASE/MEDICATION-SPECIFIC INFORMATION        N/A    SPECIALTY MEDICATION ADHERENCE     Medication Adherence    Patient reported X missed doses in the last month: 0  Specialty Medication: Tacrolimus 0.5mg   Patient is on additional specialty medications: No              Were doses missed due to medication being on hold? No    Tacrolimus 0.5 mg: 13 days of medicine on hand     REFERRAL TO PHARMACIST     Referral to the pharmacist: Not needed      [facility]     Shipping address confirmed in Epic.       Delivery Scheduled: Yes, Expected medication delivery date: 01/13/23.     Medication will be delivered via UPS to the prescription address in Epic WAM.    [name], [facility]   [facility] Specialty Pharmacist

## 2023-01-12 MED FILL — LEVOTHYROXINE 75 MCG TABLET: ORAL | 30 days supply | Qty: 30 | Fill #5

## 2023-01-12 MED FILL — CHLORTHALIDONE 25 MG TABLET: ORAL | 30 days supply | Qty: 30 | Fill #5

## 2023-01-12 MED FILL — TACROLIMUS 0.5 MG CAPSULE, IMMEDIATE-RELEASE: ORAL | 30 days supply | Qty: 90 | Fill #10

## 2023-01-12 MED FILL — DILTIAZEM CD 180 MG CAPSULE,EXTENDED RELEASE 24 HR: ORAL | 30 days supply | Qty: 60 | Fill #5

## 2023-01-26 ENCOUNTER — Ambulatory Visit: Payer: Medicare HMO | Attending: Sports Medicine

## 2023-01-26 ENCOUNTER — Other Ambulatory Visit: Payer: Self-pay

## 2023-01-26 DIAGNOSIS — M25612 Stiffness of left shoulder, not elsewhere classified: Secondary | ICD-10-CM | POA: Diagnosis not present

## 2023-01-26 DIAGNOSIS — R600 Localized edema: Secondary | ICD-10-CM | POA: Diagnosis not present

## 2023-01-26 DIAGNOSIS — M19012 Primary osteoarthritis, left shoulder: Secondary | ICD-10-CM | POA: Diagnosis not present

## 2023-01-26 DIAGNOSIS — M6281 Muscle weakness (generalized): Secondary | ICD-10-CM | POA: Insufficient documentation

## 2023-01-26 DIAGNOSIS — M25512 Pain in left shoulder: Secondary | ICD-10-CM | POA: Insufficient documentation

## 2023-01-26 DIAGNOSIS — E11618 Type 2 diabetes mellitus with other diabetic arthropathy: Secondary | ICD-10-CM | POA: Insufficient documentation

## 2023-01-26 DIAGNOSIS — M7500 Adhesive capsulitis of unspecified shoulder: Secondary | ICD-10-CM | POA: Insufficient documentation

## 2023-01-26 DIAGNOSIS — G8929 Other chronic pain: Secondary | ICD-10-CM | POA: Diagnosis not present

## 2023-01-26 NOTE — Therapy (Addendum)
OUTPATIENT PHYSICAL THERAPY SHOULDER EVALUATION   Patient Name: [name] MRN: [id number] DOB:[birth year], 67 y.o., male Today's Date: 01/26/2023  END OF SESSION:  PT End of Session - 01/26/23 1134     Visit Number 1    Number of Visits 17    Date for PT Re-Evaluation 03/23/23    Authorization Type [employer] MCR    PT Start Time 1045    PT Stop Time 1115    PT Time Calculation (min) 30 min    Activity Tolerance Patient tolerated treatment well    Behavior During Therapy WFL for tasks assessed/performed             Past Medical History:  Diagnosis Date   Abdominal pain, epigastric 03/31/2009   Acute on chronic systolic heart failure (HCC) 03/02/2009   ANEMIA, OTHER UNSPEC 04/03/2008   AUTOMATIC IMPLANTABLE CARDIAC DEFIBRILLATOR SITU 02/26/2009   on old heart   Blood transfusion without reported diagnosis    CHF (congestive heart failure) (HCC)    CORONARY ARTERY DISEASE 04/25/2007   DIABETES MELLITUS, TYPE II 12/13/2008   no meds, diet contolled   GERD (gastroesophageal reflux disease)    HYPERLIPIDEMIA 04/25/2007   Hypertension    HYPERTENSION, HX OF 05/14/2008   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   MYOCARDIAL INFARCTION, 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   OBESITY 02/26/2009   Thyroid disease    THYROID DISEASE, HX OF 05/14/2008   VENTRICULAR TACHYCARDIA 02/26/2009   Past Surgical History:  Procedure Laterality Date   ANGIOPLASTY     with stent   CARDIAC ASSIST DEVICE REMOVAL     CARDIAC CATHETERIZATION     COLONOSCOPY     CORONARY ARTERY BYPASS GRAFT     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   LEFT VENTRICULAR ASSIST DEVICE  05/2009   [facility]   Patient Active Problem List   Diagnosis Date Noted   Immunosuppressed status (HCC) 02/23/2019   Chronic abdominal pain 12/27/2015   Heart transplanted (HCC) 06/19/2014   GERD (gastroesophageal reflux disease) 06/19/2014   Osteoarthritis 06/19/2014   BPH (benign prostatic hypertrophy) 02/26/2014   History of adenomatous polyp of colon  09/17/2013   Chronic systolic heart failure (HCC) 03/02/2009   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   AUTOMATIC IMPLANTABLE CARDIAC DEFIBRILLATOR SITU 02/26/2009   Diabetes mellitus type II, controlled (HCC) 12/13/2008   Hypothyroidism 05/14/2008   Essential hypertension 05/14/2008   ANEMIA, OTHER UNSPEC 04/03/2008   Hyperlipidemia associated with type 2 diabetes mellitus (HCC) 04/25/2007   Coronary atherosclerosis 04/25/2007    PCP: Shelva Majestic, MD  REFERRING PROVIDER: Richardean Sale, DO  REFERRING DIAG:  [phone] (ICD-10-CM) - Chronic left shoulder pain M19.012 (ICD-10-CM) - Primary osteoarthritis of left shoulder E11.618,M75.00 (ICD-10-CM) - 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 Denies trauma or MOI, was doing therapy at [facility] for frozen shoulder with some success. Had recent injection by Dr. Jean Rosenthal and says this has helped his L shoulder range and pain. Denies N/T down L UE, is R hand dominant which has been helpful for ADLs.   Hand dominance: Right  PERTINENT HISTORY: Heart transplant, DM II, HTN  PAIN:  Are you having pain?  No: NPRS scale: 0/10 Worst: 10/10 Pain location: L shoulder  Pain description: sharp, tight Aggravating factors: overhead reaching, cross  arm Relieving factors: rest, heat  PRECAUTIONS: None  WEIGHT BEARING RESTRICTIONS: No  FALLS:  Has patient fallen in last 6 months? No  LIVING ENVIRONMENT: Lives with: lives with their family Lives in: House/apartment  OCCUPATION: Retired  PLOF: Independent  PATIENT GOALS:improve L shoulder mobility with overhead reaching into cabinets  OBJECTIVE:   DIAGNOSTIC FINDINGS:  N/A  PATIENT SURVEYS:  FOTO: 53% function; 65% predicted  COGNITION: Overall cognitive status: Within functional limits for tasks assessed     SENSATION: WFL  POSTURE: Rounded shoulders, fwd head  UPPER EXTREMITY ROM:   Active ROM Right eval Left eval  Shoulder flexion WFL 95  Shoulder extension    Shoulder abduction WFL 70  Shoulder adduction    Shoulder internal rotation WFL 30  Shoulder external rotation WFL 15  Elbow flexion    Elbow extension    Wrist flexion    Wrist extension    Wrist ulnar deviation    Wrist radial deviation    Wrist pronation    Wrist supination    (Blank rows = not tested)  UPPER EXTREMITY MMT:  MMT Right eval Left eval  Shoulder flexion    Shoulder extension    Shoulder abduction    Shoulder adduction    Shoulder internal rotation    Shoulder external rotation    Middle trapezius    Lower trapezius    Elbow flexion    Elbow extension    Wrist flexion    Wrist extension    Wrist ulnar deviation    Wrist radial deviation    Wrist pronation    Wrist supination    Grip strength (lbs)    (Blank rows = not tested)  SHOULDER SPECIAL TESTS: Impingement tests: DNT SLAP lesions: DNT Instability tests: DNT Rotator cuff assessment: DNT Biceps assessment: DNT  JOINT MOBILITY TESTING:  Decreasd mobility in capsular pattern  PALPATION:  TTP to L deltoid, R infraspinatus    TREATMENT: [facility] Adult PT Treatment:                                                DATE: 01/26/2023 Therapeutic Exercise: Supine dow flexion x 5 Supine horizontal  abd x 5 GTB Seated shoulder ER AAROM with dow x 5 L Wall slide shoulder flex/abd x 5 each L Standing row x 5 GTB  PATIENT EDUCATION: Education details: eval findings, FOTO, HEP, POC Person educated: Patient Education method: Explanation, Demonstration, and Handouts Education comprehension: verbalized understanding and returned demonstration  HOME EXERCISE PROGRAM: Access Code: [email or web address] URL: [email or web address] Date: 01/26/2023 Prepared by: Edwinna Areola  Exercises - Supine Shoulder Flexion Extension AAROM with Dowel  - 1 x daily - 7 x weekly - 2 sets - 10 reps -  5 sec hold - Supine Shoulder Horizontal Abduction with Resistance  - 1 x daily - 7 x weekly - 3 sets - 10 reps - green band hold - Seated Shoulder External Rotation AAROM with Dowel  - 1 x daily - 7 x weekly - 2 sets - 10 reps - 5 sec hold - Shoulder Flexion Wall Slide with Towel  - 1 x daily - 7 x weekly - 2 sets - 10 reps - Standing Shoulder Abduction Slides at Wall  - 1 x daily - 7 x weekly - 2 sets - 10 reps - Standing Shoulder Row with Anchored Resistance  - 1 x daily - 7 x weekly - 3 sets - 10 reps - green band hold  ASSESSMENT:  CLINICAL IMPRESSION: Patient is a 67 y.o. M who was seen today for physical therapy evaluation and treatment for L shoulder pain and stiffness. Physical findings are consistent with physician impression as pt has significant decrease in L shoulder ROM in capsular pattern. His FOTO score shows significant decrease in subjective functional ability below PLOF. Pt would benefit from skilled PT services, will continue per POC as prescribed and progress as able.   OBJECTIVE IMPAIRMENTS: decreased activity tolerance, decreased endurance, decreased mobility, decreased ROM, decreased strength, impaired UE functional use, and pain.   ACTIVITY LIMITATIONS: carrying, lifting, bathing, dressing, self feeding, reach over head, hygiene/grooming, and caring for others  PARTICIPATION  LIMITATIONS: meal prep, cleaning, driving, shopping, community activity, occupation, and yard work  PERSONAL FACTORS: Fitness and 3+ comorbidities: Heart transplant, DM II, HTN  are also affecting patient's functional outcome.   REHAB POTENTIAL: Excellent  CLINICAL DECISION MAKING: Stable/uncomplicated  EVALUATION COMPLEXITY: Low   GOALS: Goals reviewed with patient? No  SHORT TERM GOALS: Target date: 02/16/2023   Pt will be compliant and knowledgeable with initial HEP for improved comfort and carryover Baseline: initial HEP given  Goal status: INITIAL  2.  Pt will self report left shoulder pain no greater than 6/10 for improved comfort and functional ability Baseline: 10/10 at worst Goal status: INITIAL   LONG TERM GOALS: Target date: 03/23/2023   Pt will improve FOTO function score to no less than 65% as proxy for functional improvement with home ADLs and community activities  Baseline: 53% function Goal status: INITIAL   2.  Pt will self report left shoulder pain no greater than 3/10 for improved comfort and functional ability Baseline: 10/10 at worst Goal status: INITIAL   3.  Pt will improve L shoulder flex/abd AROM to no less than 125 degrees for improved functional ability for overhead reaching into cabinets  Baseline: see ROM chart Goal status: INITIAL  4.  Pt will improve L shoulder strength to at least 4/5 for all tested motions for improved stability with overhead activities  Baseline: see MMT chart Goal status: INITIAL  PLAN:  PT FREQUENCY: 1-2x/week  PT DURATION: 8 weeks  PLANNED INTERVENTIONS: Therapeutic exercises, Therapeutic activity, Neuromuscular re-education, Balance training, Gait training, Patient/Family education, Self Care, Joint mobilization, Dry Needling, Electrical stimulation, Cryotherapy, Moist heat, Vasopneumatic device, and Re-evaluation  PLAN FOR NEXT SESSION: assess HEP response, strength and AAROM, PROM to L shoulder  Referring  diagnosis?  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 (if different than referring diagnosis)  Stiffness of left shoulder, not elsewhere classified  Muscle weakness (generalized) Localized edema  What was this (  referring dx) caused by? []  Surgery []  Fall [x]  Ongoing issue []  Arthritis []  Other: ____________  Laterality: []  Rt [x]  Lt []  Both  Check all possible CPT codes:  *CHOOSE 10 OR LESS*    [x]  97110 (Therapeutic Exercise)  []  92507 (SLP Treatment)  [x]  97112 (Neuro Re-ed)   []  92526 (Swallowing Treatment)   [x]  40981 (Gait Training)   []  [id number] (Cognitive Training, 1st 15 minutes) [x]  97140 (Manual Therapy)   []  97130 (Cognitive Training, each add'l 15 minutes)  [x]  97164 (Re-evaluation)                              []  Other, List CPT Code ____________  [x]  97530 (Therapeutic Activities)     [x]  97535 (Self Care)   []  All codes above (97110 - 97535)  []  97012 (Mechanical Traction)  [x]  97014 (E-stim Unattended)  [x]  97032 (E-stim manual)  []  97033 (Ionto)  []  97035 (Ultrasound) []  97750 (Physical Performance Training) []  [id number] (Aquatic Therapy) [x]  97016 (Vasopneumatic Device) []  [id number] (Paraffin) []  97034 (Contrast Bath) []  97597 (Wound Care 1st 20 sq cm) []  97598 (Wound Care each add'l 20 sq cm) []  97760 (Orthotic Fabrication, Fitting, Training Initial) []  [id number] (Prosthetic Management and Training Initial) []  [id number] (Orthotic or Prosthetic Training/ Modification Subsequent)   Eloy End, PT 01/26/2023, 12:08 PM

## 2023-01-26 NOTE — Addendum Note (Signed)
Addended by: Eloy End on: 01/26/2023 12:08 PM   Modules accepted: Orders

## 2023-01-28 ENCOUNTER — Other Ambulatory Visit: Payer: Self-pay | Admitting: Family Medicine

## 2023-01-28 DIAGNOSIS — R7309 Other abnormal glucose: Secondary | ICD-10-CM

## 2023-02-01 NOTE — Progress Notes (Deleted)
[name] [address] [place] [address] Phone: [phone]   Assessment and Plan:     There are no diagnoses linked to this encounter.  ***   Pertinent previous records reviewed include ***   Follow Up: ***     Subjective:   I, [name], am serving as a Neurosurgeon for Doctor Richardean Sale   Chief Complaint: left shoulder pain    HPI:    07/05/2022 Patient is a 67 year old male complaining of left shoulder pain. Patient states since the end of June he has been in pain , he was reaching behind him and he felt immediate pain , decreased ROM , some numbness or tingling, no meds , pain radiates down the elbow up the neck and to the shoulder blade , sometimes pain down to hs fingers    08/04/2022 Patient states he is doing just fine    08/25/2022 Patient states doesn't hurt as much still decreased mobility    10/06/2022 Patient states he is doing just fine    01/04/2023 Patient states its there    02/08/2023 Patient states   Relevant Historical Information: GERD, DM type II, hypothyroidism, history of heart transplant  Additional pertinent review of systems negative.   Current Outpatient Medications:    ACCU-CHEK GUIDE test strip, USE 1 TEST STRIP  4 TIMES DAILY, Disp: 100 each, Rfl: 0   amLODipine (NORVASC) 10 MG tablet, Take 10 mg by mouth daily. Per [facility], Disp: , Rfl:    aspirin EC 81 MG tablet, Take 1 tablet (81 mg total) by mouth daily., Disp: 30 tablet, Rfl: 0   atorvastatin (LIPITOR) 80 MG tablet, Take 1 tablet (80 mg total) by mouth daily at 6 PM., Disp: 30 tablet, Rfl: 0   blood glucose meter kit and supplies KIT, Dispense based on patient and insurance preference. Use up to four times daily as directed., Disp: 1 each, Rfl: 0   chlorthalidone (HYGROTON) 25 MG tablet, Take 1 tablet (25 mg total) by mouth daily., Disp: 30 tablet, Rfl: 0   cholecalciferol (VITAMIN D) 1000 units tablet, Take 3 tablets  (3,000 Units total) by mouth daily., Disp: 90 tablet, Rfl: 0   diltiazem (TIAZAC) 180 MG 24 hr capsule, Take 2 capsules (360 mg total) by mouth daily., Disp: 60 capsule, Rfl: 0   ezetimibe (ZETIA) 10 MG tablet, Take 1 tablet by mouth once daily, Disp: 90 tablet, Rfl: 0   famotidine (PEPCID) 20 MG tablet, Take 20 mg by mouth in the morning and at bedtime., Disp: , Rfl:    levothyroxine (SYNTHROID, LEVOTHROID) 75 MCG tablet, Take 1 tablet (75 mcg total) by mouth daily before breakfast., Disp: 30 tablet, Rfl: 0   magnesium oxide (MAG-OX) 400 MG tablet, Take 1.5 tablets (600 mg total) by mouth 2 (two) times daily., Disp: 45 tablet, Rfl: 0   metFORMIN (GLUCOPHAGE) 1000 MG tablet, TAKE 1 TABLET BY MOUTH TWICE DAILY WITH MEALS, Disp: 180 tablet, Rfl: 0   mycophenolate (CELLCEPT) 500 MG tablet, Take 1 tablet (500 mg total) by mouth 2 (two) times daily., Disp: 60 tablet, Rfl: 0   tacrolimus (PROGRAF) 0.5 MG capsule, Take 2 capsules (1 mg total) by mouth 2 (two) times daily. (Patient taking differently: Take by mouth 2 (two) times daily. 2 tablets in the AM 1 tablet in the PM), Disp: 120 capsule, Rfl: 0   tamsulosin (FLOMAX) 0.4 MG CAPS capsule, Take 0.4 mg by mouth., Disp: , Rfl:  valsartan (DIOVAN) 160 MG tablet, Take 320 mg by mouth daily. , Disp: , Rfl:    Objective:     There were no vitals filed for this visit.    There is no height or weight on file to calculate BMI.    Physical Exam:    ***   Electronically signed by:  [name] 11:26 AM 02/01/23

## 2023-02-02 ENCOUNTER — Telehealth: Payer: Self-pay

## 2023-02-02 NOTE — Telephone Encounter (Signed)
Called and LVM, to offer PT opening with [facility], or [facility] today

## 2023-02-08 ENCOUNTER — Ambulatory Visit: Payer: Medicare HMO | Attending: Sports Medicine

## 2023-02-08 ENCOUNTER — Ambulatory Visit: Payer: Medicare HMO | Admitting: Sports Medicine

## 2023-02-08 DIAGNOSIS — R600 Localized edema: Secondary | ICD-10-CM | POA: Diagnosis not present

## 2023-02-08 DIAGNOSIS — M25612 Stiffness of left shoulder, not elsewhere classified: Secondary | ICD-10-CM | POA: Diagnosis not present

## 2023-02-08 DIAGNOSIS — M6281 Muscle weakness (generalized): Secondary | ICD-10-CM | POA: Insufficient documentation

## 2023-02-08 NOTE — Therapy (Signed)
OUTPATIENT PHYSICAL THERAPY SHOULDER EVALUATION   Patient Name: [name] MRN: [id number] DOB:[birth year], 67 y.o., male Today's Date: 02/08/2023  END OF SESSION:  PT End of Session - 02/08/23 1444     Visit Number 2    Number of Visits 17    Date for PT Re-Evaluation 03/23/23    Authorization Type [employer] MCR    PT Start Time 1445    PT Stop Time 1523    PT Time Calculation (min) 38 min    Activity Tolerance Patient tolerated treatment well    Behavior During Therapy WFL for tasks assessed/performed             Past Medical History:  Diagnosis Date   Abdominal pain, epigastric 03/31/2009   Acute on chronic systolic heart failure (HCC) 03/02/2009   ANEMIA, OTHER UNSPEC 04/03/2008   AUTOMATIC IMPLANTABLE CARDIAC DEFIBRILLATOR SITU 02/26/2009   on old heart   Blood transfusion without reported diagnosis    CHF (congestive heart failure) (HCC)    CORONARY ARTERY DISEASE 04/25/2007   DIABETES MELLITUS, TYPE II 12/13/2008   no meds, diet contolled   GERD (gastroesophageal reflux disease)    HYPERLIPIDEMIA 04/25/2007   Hypertension    HYPERTENSION, HX OF 05/14/2008   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   MYOCARDIAL INFARCTION, 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   OBESITY 02/26/2009   Thyroid disease    THYROID DISEASE, HX OF 05/14/2008   VENTRICULAR TACHYCARDIA 02/26/2009   Past Surgical History:  Procedure Laterality Date   ANGIOPLASTY     with stent   CARDIAC ASSIST DEVICE REMOVAL     CARDIAC CATHETERIZATION     COLONOSCOPY     CORONARY ARTERY BYPASS GRAFT     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   LEFT VENTRICULAR ASSIST DEVICE  05/2009   [facility]   Patient Active Problem List   Diagnosis Date Noted   Immunosuppressed status (HCC) 02/23/2019   Chronic abdominal pain 12/27/2015   Heart transplanted (HCC) 06/19/2014   GERD (gastroesophageal reflux disease) 06/19/2014   Osteoarthritis 06/19/2014   BPH (benign prostatic hypertrophy) 02/26/2014   History of adenomatous polyp of colon  09/17/2013   Chronic systolic heart failure (HCC) 03/02/2009   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   AUTOMATIC IMPLANTABLE CARDIAC DEFIBRILLATOR SITU 02/26/2009   Diabetes mellitus type II, controlled (HCC) 12/13/2008   Hypothyroidism 05/14/2008   Essential hypertension 05/14/2008   ANEMIA, OTHER UNSPEC 04/03/2008   Hyperlipidemia associated with type 2 diabetes mellitus (HCC) 04/25/2007   Coronary atherosclerosis 04/25/2007    PCP: Shelva Majestic, MD  REFERRING PROVIDER: Richardean Sale, DO  REFERRING DIAG:  [phone] (ICD-10-CM) - Chronic left shoulder pain M19.012 (ICD-10-CM) - Primary osteoarthritis of left shoulder E11.618,M75.00 (ICD-10-CM) - 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 States he has not yet worked on [employer].   Hand dominance: Right  PERTINENT HISTORY: Heart transplant, DM II, HTN  PAIN:  Are you having pain?  No: NPRS scale: 0/10 Worst: 10/10 Pain location: L shoulder  Pain description: sharp, tight Aggravating factors: overhead reaching, cross arm Relieving factors: rest, heat  PRECAUTIONS: None  WEIGHT BEARING RESTRICTIONS: No  FALLS:  Has patient fallen in last 6 months? No  LIVING ENVIRONMENT: Lives with: lives with their family Lives in: House/apartment  OCCUPATION: Retired  PLOF: Independent  PATIENT GOALS:improve L shoulder mobility with overhead reaching into cabinets  OBJECTIVE:   DIAGNOSTIC FINDINGS:  N/A  PATIENT SURVEYS:  FOTO:  53% function; 65% predicted  COGNITION: Overall cognitive status: Within functional limits for tasks assessed     SENSATION: WFL  POSTURE: Rounded shoulders, fwd head  UPPER EXTREMITY ROM:   Active ROM Right eval Left eval  Shoulder flexion WFL 95  Shoulder extension    Shoulder abduction WFL 70  Shoulder adduction    Shoulder internal rotation WFL 30  Shoulder external rotation WFL 15  Elbow flexion    Elbow extension    Wrist flexion    Wrist extension    Wrist ulnar deviation    Wrist radial deviation    Wrist pronation    Wrist supination    (Blank rows = not tested)  UPPER EXTREMITY MMT:  MMT Right eval Left eval  Shoulder flexion    Shoulder extension    Shoulder abduction    Shoulder adduction    Shoulder internal rotation    Shoulder external rotation    Middle trapezius    Lower trapezius    Elbow flexion    Elbow extension    Wrist flexion    Wrist extension    Wrist ulnar deviation    Wrist radial deviation    Wrist pronation    Wrist supination    Grip strength (lbs)    (Blank rows = not tested)  SHOULDER SPECIAL TESTS: Impingement tests: DNT SLAP lesions: DNT Instability tests: DNT Rotator cuff assessment: DNT Biceps assessment: DNT  JOINT MOBILITY TESTING:  Decreasd mobility in capsular pattern  PALPATION:  TTP to L deltoid, R infraspinatus    TREATMENT: [facility] Adult PT Treatment:                                                DATE: 02/08/2023 Therapeutic Exercise: UBE lvl 1.0 x 4 min while taking subjective Pulley's flexion x 2 min  Row 3x10 20# L shoulder wall slide 2x10 (flexion) L shoulder wall walk x 10  Shoulder ER 2x10 RTB L Supine pball flexion 2x15 Supine horizontal abd 3x10 GTB Supine shoulder flex x 10 L Manual Therapy PROM into L shoulder flex/abd/IR/ER  [facility] Adult PT Treatment:                                                DATE: 01/26/2023 Therapeutic Exercise: Supine dow flexion x 5 Supine horizontal  abd x 5 GTB Seated shoulder ER AAROM with dow x 5 L Wall slide shoulder flex/abd x 5 each L Standing row x 5  GTB  PATIENT EDUCATION: Education details: continue HEP Person educated: Patient Education method: Explanation, Demonstration, and Handouts Education comprehension: verbalized understanding and returned demonstration  HOME EXERCISE PROGRAM: Access Code: [email or web address] URL: [email or web address] Date: 01/26/2023 Prepared by: Edwinna Areola  Exercises - Supine Shoulder Flexion Extension AAROM with Dowel  - 1 x daily - 7 x weekly - 2 sets - 10 reps - 5 sec hold - Supine Shoulder Horizontal Abduction with Resistance  - 1 x daily - 7 x weekly - 3 sets - 10 reps - green band hold - Seated Shoulder External Rotation AAROM with Dowel  - 1 x daily - 7 x weekly - 2 sets - 10 reps - 5 sec hold - Shoulder Flexion Wall Slide with Towel  - 1 x daily - 7 x weekly - 2 sets - 10 reps - Standing Shoulder Abduction Slides at Wall  - 1 x daily - 7 x weekly - 2 sets - 10 reps - Standing Shoulder Row with Anchored Resistance  - 1 x daily - 7 x weekly - 3 sets - 10 reps - green band hold  ASSESSMENT:  CLINICAL IMPRESSION: Pt was able to complete all prescribed exercises with no adverse effect. Therapy focused on improving L shoulder strength and ROM. Is progressing as expected, will continue per POC.   Patient is a 67 y.o. M who was seen today for physical therapy evaluation and treatment for L shoulder pain and stiffness. Physical findings are consistent with physician impression as pt has significant decrease in L shoulder ROM in capsular pattern. His FOTO score shows significant decrease in subjective functional ability below PLOF. Pt would benefit from skilled PT services, will continue per POC as prescribed and progress as able.   OBJECTIVE IMPAIRMENTS: decreased activity tolerance, decreased endurance, decreased mobility, decreased ROM, decreased strength, impaired UE functional use, and  pain.   ACTIVITY LIMITATIONS: carrying, lifting, bathing, dressing, self feeding, reach over head, hygiene/grooming, and caring for others  PARTICIPATION LIMITATIONS: meal prep, cleaning, driving, shopping, community activity, occupation, and yard work  PERSONAL FACTORS: Fitness and 3+ comorbidities: Heart transplant, DM II, HTN  are also affecting patient's functional outcome.    GOALS: Goals reviewed with patient? No  SHORT TERM GOALS: Target date: 02/16/2023   Pt will be compliant and knowledgeable with initial HEP for improved comfort and carryover Baseline: initial HEP given  Goal status: INITIAL  2.  Pt will self report left shoulder pain no greater than 6/10 for improved comfort and functional ability Baseline: 10/10 at worst Goal status: INITIAL   LONG TERM GOALS: Target date: 03/23/2023   Pt will improve FOTO function score to no less than 65% as proxy for functional improvement with home ADLs and community activities  Baseline: 53% function Goal status: INITIAL   2.  Pt will self report left shoulder pain no greater than 3/10 for improved comfort and functional ability Baseline: 10/10 at worst Goal status: INITIAL   3.  Pt will improve L shoulder flex/abd AROM to no less than 125 degrees for improved functional ability for overhead reaching into cabinets  Baseline: see ROM chart Goal status: INITIAL  4.  Pt will improve L shoulder strength to at least 4/5 for all tested motions for improved stability with overhead activities  Baseline: see MMT chart Goal status: INITIAL  PLAN:  PT FREQUENCY: 1-2x/week  PT DURATION: 8 weeks  PLANNED INTERVENTIONS: Therapeutic exercises, Therapeutic activity, Neuromuscular re-education, Balance training, Gait training, Patient/Family education, Self Care, Joint mobilization, Dry  Needling, Electrical stimulation, Cryotherapy, Moist heat, Vasopneumatic device, and Re-evaluation  PLAN FOR NEXT SESSION: assess HEP response, strength  and AAROM, PROM to L shoulder  Referring diagnosis?  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 (if different than referring diagnosis)  Stiffness of left shoulder, not elsewhere classified  Muscle weakness (generalized) Localized edema  What was this (referring dx) caused by? []  Surgery []  Fall [x]  Ongoing issue []  Arthritis []  Other: ____________  Laterality: []  Rt [x]  Lt []  Both  Check all possible CPT codes:  *CHOOSE 10 OR LESS*    [x]  97110 (Therapeutic Exercise)  []  92507 (SLP Treatment)  [x]  97112 (Neuro Re-ed)   []  92526 (Swallowing Treatment)   [x]  97116 (Gait Training)   []  [id number] (Cognitive Training, 1st 15 minutes) [x]  97140 (Manual Therapy)   []  97130 (Cognitive Training, each add'l 15 minutes)  [x]  97164 (Re-evaluation)                              []  Other, List CPT Code ____________  [x]  97530 (Therapeutic Activities)     [x]  32440 (Self Care)   []  All codes above (97110 - 97535)  []  97012 (Mechanical Traction)  [x]  97014 (E-stim Unattended)  [x]  97032 (E-stim manual)  []  97033 (Ionto)  []  97035 (Ultrasound) []  97750 (Physical Performance Training) []  [id number] (Aquatic Therapy) [x]  97016 (Vasopneumatic Device) []  [id number] (Paraffin) []  97034 (Contrast Bath) []  97597 (Wound Care 1st 20 sq cm) []  97598 (Wound Care each add'l 20 sq cm) []  97760 (Orthotic Fabrication, Fitting, Training Initial) []  [id number] (Prosthetic Management and Training Initial) []  [id number] (Orthotic or Prosthetic Training/ Modification Subsequent)   Eloy End, PT 02/08/2023, 3:25 PM

## 2023-02-14 ENCOUNTER — Telehealth: Payer: Self-pay | Admitting: Pharmacist

## 2023-02-14 MED ORDER — ATORVASTATIN 80 MG TABLET
ORAL_TABLET | Freq: Every day | ORAL | 3 refills | 90 days
Start: 2023-02-14 — End: 2024-02-14

## 2023-02-14 NOTE — Progress Notes (Signed)
Care Management & Coordination Services Pharmacy Team  Reason for Encounter: Appointment Reminder  Contacted patient to confirm telephone appointment with Erskine Emery, PharmD on 02/15/2023 at 11:15 am. Spoke with patient on 02/14/2023    Star Rating Drugs:  Atorvastatin 80 mg last filled 11/15/2022 90 DS Metformin 1000 mg last filled 01/28/2023 90 DS Valsartan 160 mg last filled 11/11/2022 90 DS   Care Gaps: Annual wellness visit in last year? Yes  If Diabetic: Last eye exam / retinopathy screening: 06/20/2020 Last diabetic foot exam: 01/06/2023  Future Appointments  Date Time Provider Department Center  02/15/2023 11:15 AM Erroll Luna, [facility] None  02/15/2023  3:30 PM Georganna Skeans [facility]  02/17/2023  3:30 PM Eloy End, PT [facility]  02/22/2023  3:30 PM Berta Minor, PTA [facility]  02/24/2023  3:30 PM Berta Minor, PTA [facility]  03/15/2023  1:00 PM Richardean Sale, DO [facility] None  07/21/2023 10:20 AM Shelva Majestic, MD [facility]   April D Calhoun, [facility] Clinical Pharmacist Assistant [phone]

## 2023-02-15 ENCOUNTER — Ambulatory Visit: Payer: Medicare HMO

## 2023-02-15 ENCOUNTER — Ambulatory Visit: Payer: Medicare HMO | Admitting: Pharmacist

## 2023-02-15 DIAGNOSIS — R600 Localized edema: Secondary | ICD-10-CM

## 2023-02-15 DIAGNOSIS — M6281 Muscle weakness (generalized): Secondary | ICD-10-CM | POA: Diagnosis not present

## 2023-02-15 DIAGNOSIS — M25612 Stiffness of left shoulder, not elsewhere classified: Secondary | ICD-10-CM | POA: Diagnosis not present

## 2023-02-15 MED ORDER — ATORVASTATIN 80 MG TABLET
ORAL_TABLET | Freq: Every day | ORAL | 3 refills | 90 days | Status: CP
Start: 2023-02-15 — End: 2024-02-15
  Filled 2023-02-16: qty 90, 90d supply, fill #0

## 2023-02-15 NOTE — Therapy (Signed)
OUTPATIENT PHYSICAL THERAPY TREATMENT NOTE   Patient Name: [name] MRN: [id number] DOB:08/29/1956, 67 y.o., male Today's Date: 02/15/2023  END OF SESSION:  PT End of Session - 02/15/23 1527     Visit Number 3    Number of Visits 17    Date for PT Re-Evaluation 03/23/23    Authorization Type [employer] M[employer]    PT Start Time 1530    PT Stop Time 1610    PT Time Calculation (min) 40 min    Activity Tolerance Patient tolerated treatment well    Behavior During Therapy WFL for tasks assessed/performed              Past Medical History:  Diagnosis Date   Abdominal pain, epigastric 03/31/2009   Acute on chronic systolic heart failure (HCC) 03/02/2009   ANEMIA, OTHER UNSPEC 04/03/2008   AUTOMATIC IMPLANTABLE CARDIAC DEFIBRILLATOR SITU 02/26/2009   on old heart   Blood transfusion without reported diagnosis    CHF (congestive heart failure) (HCC)    CORONARY ARTERY DISEASE 04/25/2007   DIABETES MELLITUS, TYPE II 12/13/2008   no meds, diet contolled   GERD (gastroesophageal reflux disease)    HYPERLIPIDEMIA 04/25/2007   Hypertension    HYPERTENSION, HX OF 05/14/2008   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   MYOCARDIAL INFARCTION, 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   OBESITY 02/26/2009   Thyroid disease    THYROID DISEASE, HX OF 05/14/2008   VENTRICULAR TACHYCARDIA 02/26/2009   Past Surgical History:  Procedure Laterality Date   ANGIOPLASTY     with stent   CARDIAC ASSIST DEVICE REMOVAL     CARDIAC CATHETERIZATION     COLONOSCOPY     CORONARY ARTERY BYPASS GRAFT     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   LEFT VENTRICULAR ASSIST DEVICE  05/2009   [facility]   Patient Active Problem List   Diagnosis Date Noted   Immunosuppressed status (HCC) 02/23/2019   Chronic abdominal pain 12/27/2015   Heart transplanted (HCC) 06/19/2014   GERD (gastroesophageal reflux disease) 06/19/2014   Osteoarthritis 06/19/2014   BPH (benign prostatic hypertrophy) 02/26/2014   History of adenomatous polyp of colon  09/17/2013   Chronic systolic heart failure (HCC) 03/02/2009   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   AUTOMATIC IMPLANTABLE CARDIAC DEFIBRILLATOR SITU 02/26/2009   Diabetes mellitus type II, controlled (HCC) 12/13/2008   Hypothyroidism 05/14/2008   Essential hypertension 05/14/2008   ANEMIA, OTHER UNSPEC 04/03/2008   Hyperlipidemia associated with type 2 diabetes mellitus (HCC) 04/25/2007   Coronary atherosclerosis 04/25/2007    PCP: Shelva Majestic, MD  REFERRING PROVIDER: Richardean Sale, DO  REFERRING DIAG:  [phone] (ICD-10-CM) - Chronic left shoulder pain M19.012 (ICD-10-CM) - Primary osteoarthritis of left shoulder E11.618,M75.00 (ICD-10-CM) - 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 He reports no current pain, has pain with OH, behind the back, and across the body reaching motions.   Hand dominance: Right  PERTINENT HISTORY: Heart transplant, DM II, HTN  PAIN:  Are you having pain?  No: NPRS scale: 0/10 Worst: 8/10 Pain location: L shoulder  Pain description: sharp, tight Aggravating factors: overhead reaching, cross arm Relieving factors: rest, heat  PRECAUTIONS: None  WEIGHT BEARING RESTRICTIONS: No  FALLS:  Has patient fallen in last 6 months? No  LIVING ENVIRONMENT: Lives with: lives with their family Lives in: House/apartment  OCCUPATION: Retired  PLOF: Independent  PATIENT GOALS:I mprove L shoulder mobility with overhead reaching into  cabinets  OBJECTIVE:   DIAGNOSTIC FINDINGS:  N/A  PATIENT SURVEYS:  FOTO: 53% function; 65% predicted  COGNITION: Overall cognitive status: Within functional limits for tasks assessed     SENSATION: WFL  POSTURE: Rounded shoulders, fwd head  UPPER EXTREMITY ROM:   Active ROM Right eval Left eval  Shoulder flexion WFL 95  Shoulder extension    Shoulder abduction WFL 70  Shoulder adduction    Shoulder internal rotation WFL 30  Shoulder external rotation WFL 15  Elbow flexion    Elbow extension    Wrist flexion    Wrist extension    Wrist ulnar deviation    Wrist radial deviation    Wrist pronation    Wrist supination    (Blank rows = not tested)  UPPER EXTREMITY MMT:  MMT Right eval Left eval  Shoulder flexion    Shoulder extension    Shoulder abduction    Shoulder adduction    Shoulder internal rotation    Shoulder external rotation    Middle trapezius    Lower trapezius    Elbow flexion    Elbow extension    Wrist flexion    Wrist extension    Wrist ulnar deviation    Wrist radial deviation    Wrist pronation    Wrist supination    Grip strength (lbs)    (Blank rows = not tested)  SHOULDER SPECIAL TESTS: Impingement tests: DNT SLAP lesions: DNT Instability tests: DNT Rotator cuff assessment: DNT Biceps assessment: DNT  JOINT MOBILITY TESTING:  Decreasd mobility in capsular pattern  PALPATION:  TTP to L deltoid, R infraspinatus    TREATMENT: [facility] Adult PT Treatment:                                                DATE: 02/15/23 Therapeutic Exercise: UBE lvl 1.0 x 3'/3' fwd/bwd while taking subjective Lifting 2# weight into cabinet flexion 2x10 bottom/middle shelf - cues to decrease compensation of shoulder elevation Pulley's flexion x 2 min  Row 3x10 20# L shoulder wall slide 2x10 (flexion) L shoulder wall walk x 10  Shoulder ER 2x10 RTB L Supine pball flexion 2x15 Supine horizontal abd 2x10 GTB Manual Therapy PROM into L  shoulder flex/abd/IR/ER  [facility] Adult PT Treatment:                                                DATE: 02/08/2023 Therapeutic Exercise: UBE lvl 1.0 x 4 min while taking subjective  Pulley's flexion x 2 min  Row 3x10 20# L shoulder wall slide 2x10 (flexion) L shoulder wall walk x 10  Shoulder ER 2x10 RTB L Supine pball flexion 2x15 Supine horizontal abd 3x10 GTB Supine shoulder flex x 10 L Manual Therapy PROM into L shoulder flex/abd/IR/ER  [facility] Adult PT Treatment:                                                DATE: 01/26/2023 Therapeutic Exercise: Supine dow flexion x 5 Supine horizontal abd x 5 GTB Seated shoulder ER AAROM with dow x 5 L Wall slide shoulder flex/abd x 5 each L Standing row x 5 GTB  PATIENT EDUCATION: Education details: continue HEP Person educated: Patient Education method: Explanation, Demonstration, and Handouts Education comprehension: verbalized understanding and returned demonstration  HOME EXERCISE PROGRAM: Access Code: [email or web address] URL: [email or web address] Date: 01/26/2023 Prepared by: Edwinna Areola  Exercises - Supine Shoulder Flexion Extension AAROM with Dowel  - 1 x daily - 7 x weekly - 2 sets - 10 reps - 5 sec hold - Supine Shoulder Horizontal Abduction with Resistance  - 1 x daily - 7 x weekly - 3 sets - 10 reps - green band hold - Seated Shoulder External Rotation AAROM with Dowel  - 1 x daily - 7 x weekly - 2 sets - 10 reps - 5 sec hold - Shoulder Flexion Wall Slide with Towel  - 1 x daily - 7 x weekly - 2 sets - 10 reps - Standing Shoulder Abduction Slides at Wall  - 1 x daily - 7 x weekly - 2 sets - 10 reps - Standing Shoulder Row with Anchored Resistance  - 1 x daily - 7 x weekly - 3 sets - 10 reps - green band hold  ASSESSMENT:  CLINICAL IMPRESSION: Patient presents to PT reporting continued L shoulder pain especially with OH, behind the back, and across the body motions. Session today continued focus on Lt RTC and  periscapular strengthening as well as Lt shoulder ROM. He requires occasional cues to decrease compensation of shoulder elevation. Patient was able to tolerate all prescribed exercises with no adverse effects. Patient continues to benefit from skilled PT services and should be progressed as able to improve functional independence.    EVAL- Patient is a 67 y.o. M who was seen today for physical therapy evaluation and treatment for L shoulder pain and stiffness. Physical findings are consistent with physician impression as pt has significant decrease in L shoulder ROM in capsular pattern. His FOTO score shows significant decrease in subjective functional ability below PLOF. Pt would benefit from skilled PT services, will continue per POC as prescribed and progress as able.   OBJECTIVE IMPAIRMENTS: decreased activity tolerance, decreased endurance, decreased mobility, decreased ROM, decreased strength, impaired UE functional use, and pain.   ACTIVITY LIMITATIONS: carrying, lifting, bathing, dressing, self feeding, reach over head, hygiene/grooming, and caring for others  PARTICIPATION LIMITATIONS: meal prep, cleaning, driving, shopping, community activity, occupation, and yard work  PERSONAL FACTORS: Fitness and 3+ comorbidities: Heart transplant, DM II, HTN  are also affecting patient's functional outcome.    GOALS: Goals reviewed with patient? No  SHORT TERM GOALS: Target date: 02/16/2023   Pt will be compliant and knowledgeable with initial HEP for improved comfort and carryover Baseline: initial HEP given  Goal status: MET Pt reports adherence 02/15/23  2.  Pt will self report left shoulder pain no greater than 6/10 for improved comfort and functional ability Baseline: 10/10 at worst Goal status: Ongoing Pt reports 8/10 at worst   LONG TERM GOALS: Target date: 03/23/2023   Pt will improve FOTO function score to no less than 65% as proxy for functional improvement with home ADLs and  community activities  Baseline: 53% function Goal status: INITIAL   2.  Pt will self report left shoulder pain no greater than 3/10 for improved comfort and functional ability Baseline: 10/10 at worst Goal status: INITIAL   3.  Pt will improve L shoulder flex/abd AROM to no less than 125 degrees for improved functional ability for overhead reaching into cabinets  Baseline: see ROM chart Goal status: INITIAL  4.  Pt will improve L shoulder strength to at least 4/5 for all tested motions for improved stability with overhead activities  Baseline: see MMT chart Goal status: INITIAL  PLAN:  PT FREQUENCY: 1-2x/week  PT DURATION: 8 weeks  PLANNED INTERVENTIONS: Therapeutic exercises, Therapeutic activity, Neuromuscular re-education, Balance training, Gait training, Patient/Family education, Self Care, Joint mobilization, Dry Needling, Electrical stimulation, Cryotherapy, Moist heat, Vasopneumatic device, and Re-evaluation  PLAN FOR NEXT SESSION: assess HEP response, strength and AAROM, PROM to L shoulder  Referring diagnosis?  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 (if different than referring diagnosis)  Stiffness of left shoulder, not elsewhere classified  Muscle weakness (generalized) Localized edema  What was this (referring dx) caused by? []  Surgery []  Fall [x]  Ongoing issue []  Arthritis []  Other: ____________  Laterality: []  Rt [x]  Lt []  Both  Check all possible CPT codes:  *CHOOSE 10 OR LESS*    [x]  97110 (Therapeutic Exercise)  []  92507 (SLP Treatment)  [x]  16109 (Neuro Re-ed)   []  92526 (Swallowing Treatment)   [x]  97116 (Gait Training)   []  [id number] (Cognitive Training, 1st 15 minutes) [x]  97140 (Manual Therapy)   []  97130 (Cognitive Training, each add'l 15  minutes)  [x]  97164 (Re-evaluation)                              []  Other, List CPT Code ____________  [x]  97530 (Therapeutic Activities)     [x]  97535 (Self Care)   []  All codes above (97110 - 97535)  []  97012 (Mechanical Traction)  [x]  97014 (E-stim Unattended)  [x]  97032 (E-stim manual)  []  97033 (Ionto)  []  97035 (Ultrasound) []  97750 (Physical Performance Training) []  [id number] (Aquatic Therapy) [x]  97016 (Vasopneumatic Device) []  [id number] (Paraffin) []  97034 (Contrast Bath) []  97597 (Wound Care 1st 20 sq cm) []  97598 (Wound Care each add'l 20 sq cm) []  97760 (Orthotic Fabrication, Fitting, Training Initial) []  [id number] (Prosthetic Management and Training Initial) []  [id number] (Orthotic or Prosthetic Training/ Modification Subsequent)   Berta Minor, PTA 02/15/2023, 4:10 PM

## 2023-02-15 NOTE — Progress Notes (Incomplete)
Care Management & Coordination Services Pharmacy Note  02/15/2023 Name:  [name] MRN:  [id number] DOB:  January 01, 1956  Summary: ***  Recommendations/Changes made from today's visit: ***  Follow up plan: ***   Subjective: [name] is an 67 y.o. year old male who is a primary patient of Durene Cal, Aldine Contes, MD.  The care coordination team was consulted for assistance with disease management and care coordination needs.    Engaged with patient by telephone for follow up visit.  Recent office visits:  None since last Diabetes Adherence Call   Recent consult visits:  10/06/2022 OV (Sports Medicine) Richardean Sale, DO; no medication changes indicated.   08/25/2022 OV (Sports Medicine) Richardean Sale, DO; no medication changes indicated.   08/04/2022 OV (Sports Medicine) Richardean Sale, DO; no medication changes indicated.   Hospital visits:  None since last Diabetes Adherence Call   Objective:  Lab Results  Component Value Date   CREATININE 1.3 01/04/2023   BUN 27 (A) 01/04/2023   GFR 54.22 (L) 03/30/2022   EGFR 64 01/04/2023   GFRNONAA 48 10/22/2020   GFRAA 56 10/22/2020   NA 134 (A) 01/04/2023   K 4.9 01/04/2023   CALCIUM 9.7 01/04/2023   CO2 19 01/04/2023   GLUCOSE 134 (H) 03/30/2022    Lab Results  Component Value Date/Time   HGBA1C 6.4 09/15/2022 12:00 AM   HGBA1C 6.0 (A) 06/30/2022 10:57 AM   HGBA1C 6.4 12/10/2021 12:00 AM   GFR 54.22 (L) 03/30/2022 11:40 AM   GFR 55.68 (L) 12/17/2020 11:33 AM    Last diabetic Eye exam:  Lab Results  Component Value Date/Time   HMDIABEYEEXA No Retinopathy 06/20/2020 10:07 AM    Last diabetic Foot exam: No results found for: "HMDIABFOOTEX"   Lab Results  Component Value Date   CHOL 144 09/15/2022   HDL 41 09/15/2022   LDLCALC 78 09/15/2022   LDLDIRECT 69 02/12/2014   TRIG 127 09/15/2022   CHOLHDL 5.2 CALC 03/05/2008       Latest Ref Rng & Units 12/14/2021   12:00 AM 04/24/2021   12:00  AM 12/17/2020   11:33 AM  Hepatic Function  Total Protein 6.0 - 8.3 g/dL   7.1   Albumin 3.5 - 5.0  4.6     4.3   AST 14 - 40  12     12   ALT 10 - 40 U/L 18  16     16    Alk Phosphatase 25 - 125  78     79   Total Bilirubin 0.2 - 1.2 mg/dL   0.8      This result is from an external source.    Lab Results  Component Value Date/Time   TSH 1.25 09/15/2022 12:00 AM   TSH 1.70 04/24/2021 12:00 AM   TSH 1.58 02/12/2014 12:00 AM   TSH 2.405 ***Test methodology is 3rd generation TSH*** 04/17/2009 12:50 PM       Latest Ref Rng & Units 01/04/2023   12:00 AM 03/30/2022   11:40 AM 12/14/2021   12:00 AM  CBC  WBC  7.5  8.3  10.1   Hemoglobin 13.0 - 17.0 g/dL  40.9  81.1   Hematocrit 41 - 53 38  39.3  40   Platelets 150 - 400 K/uL 269  267.0  297     Lab Results  Component Value Date/Time   VD25OH 46 04/24/2021 12:00 AM   VITAMINB12 342 04/03/2008 11:25 AM    Clinical ASCVD: {  YES/NO:21197} The 10-year ASCVD risk score (Arnett DK, et al., 2019) is: 23.9%*   Values used to calculate the score:     Age: 63 years     Sex: Male     Is Non-Hispanic African American: No     Diabetic: Yes     Tobacco smoker: No     Systolic Blood Pressure: 122 mmHg     Is BP treated: Yes     HDL Cholesterol: 41 mg/dL*     Total Cholesterol: 144 mg/dL*     * - Cholesterol units were assumed for this score calculation    ***Other: (CHADS2VASc if Afib, MMRC or CAT for COPD, ACT, DEXA)     01/06/2023   10:00 AM 07/13/2022    2:50 PM 06/30/2022   11:02 AM  Depression screen PHQ 2/9  Decreased Interest 0 0 0  Down, Depressed, Hopeless 0 0 0  PHQ - 2 Score 0 0 0  Altered sleeping 2    Tired, decreased energy 1    Change in appetite 0    Feeling bad or failure about yourself  0    Trouble concentrating 0    Moving slowly or fidgety/restless 0    Suicidal thoughts 0    PHQ-9 Score 3    Difficult doing work/chores Not difficult at all       Social History   Tobacco Use  Smoking Status Former    Packs/day: 1.00   Years: 20.00   Additional pack years: 0.00   Total pack years: 20.00   Types: Cigarettes   Quit date: 07/02/1999   Years since quitting: 23.6  Smokeless Tobacco Never   BP Readings from Last 3 Encounters:  01/06/23 122/60  01/04/23 122/68  10/06/22 122/78   Pulse Readings from Last 3 Encounters:  01/06/23 86  01/04/23 85  10/06/22 64   Wt Readings from Last 3 Encounters:  01/06/23 222 lb 6.4 oz (100.9 kg)  01/04/23 218 lb (98.9 kg)  10/06/22 225 lb (102.1 kg)   BMI Readings from Last 3 Encounters:  01/06/23 33.82 kg/m  01/04/23 33.15 kg/m  10/06/22 34.21 kg/m    No Known Allergies  Medications Reviewed Today     Reviewed by Eloy End, PT (Physical Therapist) on 02/08/23 at 1444  Med List Status: <None>   Medication Order Taking? Sig Documenting Provider Last Dose Status Informant  ACCU-CHEK GUIDE test strip [id number] No USE 1 TEST STRIP  4 TIMES DAILY Shelva Majestic, MD Taking Active   amLODipine (NORVASC) 10 MG tablet [id number] No Take 10 mg by mouth daily. Per [facility] [provider] Taking Active   aspirin EC 81 MG tablet [id number] No Take 1 tablet (81 mg total) by mouth daily. Shelva Majestic, MD Taking Active   atorvastatin (LIPITOR) 80 MG tablet [id number] No Take 1 tablet (80 mg total) by mouth daily at 6 PM. Shelva Majestic, MD Taking Active   blood glucose meter kit and supplies KIT [id number] No Dispense based on patient and insurance preference. Use up to four times daily as directed. Shelva Majestic, MD Taking Active   chlorthalidone (HYGROTON) 25 MG tablet [id number] No Take 1 tablet (25 mg total) by mouth daily. Shelva Majestic, MD Taking Active   cholecalciferol (VITAMIN D) 1000 units tablet [id number] No Take 3 tablets (3,000 Units total) by mouth daily. Shelva Majestic, MD Taking Active   diltiazem [facility]) 180 MG 24 hr capsule [id number] No Take 2 capsules (360 mg  total) by mouth daily. Shelva Majestic, MD  Taking Active   ezetimibe (ZETIA) 10 MG tablet [id number] No Take 1 tablet by mouth once daily Shelva Majestic, MD Taking Active   famotidine (PEPCID) 20 MG tablet [id number] No Take 20 mg by mouth in the morning and at bedtime. [provider] Taking Expired 06/30/22 2359   levothyroxine (SYNTHROID, LEVOTHROID) 75 MCG tablet [id number] No Take 1 tablet (75 mcg total) by mouth daily before breakfast. Shelva Majestic, MD Taking Active   magnesium oxide (MAG-OX) 400 MG tablet [id number] No Take 1.5 tablets (600 mg total) by mouth 2 (two) times daily. Shelva Majestic, MD Taking Active   metFORMIN (GLUCOPHAGE) 1000 MG tablet [id number]  TAKE 1 TABLET BY MOUTH TWICE DAILY WITH MEALS Shelva Majestic, MD  Active   mycophenolate (CELLCEPT) 500 MG tablet [id number] No Take 1 tablet (500 mg total) by mouth 2 (two) times daily. Shelva Majestic, MD Taking Active   tacrolimus (PROGRAF) 0.5 MG capsule [id number] No Take 2 capsules (1 mg total) by mouth 2 (two) times daily.  Patient taking differently: Take by mouth 2 (two) times daily. 2 tablets in the AM 1 tablet in the PM   Shelva Majestic, MD Taking Active   tamsulosin [facility]) 0.4 MG CAPS capsule [id number] No Take 0.4 mg by mouth. [provider] Taking Active   valsartan (DIOVAN) 160 MG tablet [id number] No Take 320 mg by mouth daily.  [provider] Taking Active             SDOH:  (Social Determinants of Health) assessments and interventions performed: {yes/no:20286} SDOH Interventions    Flowsheet Row Office Visit from 01/06/2023 in [facility] Clinical Support from 07/13/2022 in [facility] from 06/05/2021 in [facility] Clinical Support from 12/20/2017 in [facility]  SDOH Interventions      Food Insecurity Interventions -- Intervention Not Indicated Intervention Not Indicated --  Housing Interventions --  Intervention Not Indicated -- --  Transportation Interventions -- Intervention Not Indicated Intervention Not Indicated --  Depression Interventions/Treatment  PHQ2-9 Score <4 Follow-up Not Indicated -- -- --  [NA]  Financial Strain Interventions -- Intervention Not Indicated Intervention Not Indicated --  Physical Activity Interventions -- Intervention Not Indicated Intervention Not Indicated --  Stress Interventions -- -- Intervention Not Indicated --  Social Connections Interventions -- Intervention Not Indicated Intervention Not Indicated --       Medication Assistance: {MEDASSISTANCEINFO:25044}  Medication Access: Name and location of current pharmacy:  [id number] -  ([place]), [place] - [address] [place] ([place]) [place] [address] Phone: [phone] Fax: [phone]  Within the past 30 days, how often has patient missed a dose of medication? *** Is a pillbox or other method used to improve adherence? {YES/NO:21197} Factors that may affect medication adherence? {CHL DESC; BARRIERS:21522} Are meds synced by current pharmacy? {YES/NO:21197} Are meds delivered by current pharmacy? {YES/NO:21197} Does patient experience delays in picking up medications due to transportation concerns? {YES/NO:21197}  Compliance/Adherence/Medication fill history: Care Gaps: ***  Star-Rating Drugs: ***   Assessment/Plan     Hypertension (BP goal <130/80) -Controlled, not assessed -Current treatment: Amlodipine 10mg  daily Appropriate, Query effective, Safe, Accessible Chlorthalidone 25mg  daily  Appropriate, Query effective, Safe, Accessible Diltiazem 180mg  CD daily  Appropriate, Query effective, Safe, Accessible Valsartan 160mg  daily  Appropriate, Query effective, Safe, Accessible -Medications previously tried: none noted  -Current home  readings: 143/69, 118/54, 149/67, 127/63, 132/64, 136/62 -Current dietary habits: unchanged see previous  -Denies  hypotensive/hypertensive symptoms -Educated on BP goals and benefits of medications for prevention of heart attack, stroke and kidney damage; Exercise goal of 150 minutes per week; Importance of home blood pressure monitoring; Symptoms of hypotension and importance of maintaining adequate hydration; -Counseled to monitor BP at home weekly, document, and provide log at future appointments -Recommended to continue current medication  Update 11/10/21 141/61, 132/66 recent BP readings Denies dizziness or HA. BP well controlled for now no changes needed. Continue current medications  Hyperlipidemia: (LDL goal < 70) 05/24/22 -Controlled, based on LDL at [facility] -Current treatment: Atorvastatin 80mg  daily Appropriate, Effective, Safe, Accessible Zetia 10mg  daily Appropriate, Effective, Safe, Accessible -Medications previously tried: none noted  -Reports he just had labs drawn at [facility], last LDL was excellent. -Educated on Cholesterol goals;  Benefits of statin for ASCVD risk reduction; Importance of limiting foods high in cholesterol; -Denies any adverse effects -Most recent LDL at [facility] was 63, continues to tolerate medication.  No changes needed at this time.  Diabetes (A1c goal <6.5%) 05/24/22 -Controlled, has been trending up -Current medications: Metformin 1000mg  BID with meals Appropriate, Query effective, -Medications previously tried: insulin glargine  -Current home glucose readings fasting glucose: 118 this morning -Denies hypoglycemic/hyperglycemic symptoms -Educated on A1c and blood sugar goals; Prevention and management of hypoglycemic episodes; Carbohydrate counting and/or plate method -Counseled to check feet daily and get yearly eye exams   Update 11/10/21 FBG - 130-150s He is eating two nutty buddy crackers per day.  States blood sugar is "creeping up."  We discussed trying to limit that to one cracker per day to see if that helps glucose. Has upcoming appt in April with  PCP - recheck A1c and adjust treatment at that time. NO changes except lifestyle mods at this time.  Patient Goals/Self-Care Activities Patient will:  - take medications as prescribed check glucose daily, document, and provide at future appointments check blood pressure weekly, document, and provide at future appointments  Follow Up Plan: The care management team will reach out to the patient again over the next 180 days.              Willa Frater, PharmD Clinical Pharmacist  [facility] [phone]

## 2023-02-16 MED FILL — VALSARTAN 160 MG TABLET: ORAL | 90 days supply | Qty: 180 | Fill #1

## 2023-02-16 MED FILL — ASPIRIN 81 MG TABLET,DELAYED RELEASE: ORAL | 90 days supply | Qty: 90 | Fill #2

## 2023-02-16 MED FILL — CHLORTHALIDONE 25 MG TABLET: ORAL | 30 days supply | Qty: 30 | Fill #6

## 2023-02-16 MED FILL — AMLODIPINE 10 MG TABLET: ORAL | 90 days supply | Qty: 90 | Fill #2

## 2023-02-16 MED FILL — TACROLIMUS 0.5 MG CAPSULE, IMMEDIATE-RELEASE: ORAL | 30 days supply | Qty: 90 | Fill #11

## 2023-02-16 MED FILL — MYCOPHENOLATE MOFETIL 500 MG TABLET: ORAL | 90 days supply | Qty: 180 | Fill #2

## 2023-02-16 MED FILL — DILTIAZEM CD 180 MG CAPSULE,EXTENDED RELEASE 24 HR: ORAL | 30 days supply | Qty: 60 | Fill #6

## 2023-02-16 MED FILL — FAMOTIDINE 20 MG TABLET: ORAL | 90 days supply | Qty: 180 | Fill #2

## 2023-02-16 MED FILL — LEVOTHYROXINE 75 MCG TABLET: ORAL | 30 days supply | Qty: 30 | Fill #6

## 2023-02-17 ENCOUNTER — Ambulatory Visit: Payer: Medicare HMO

## 2023-02-17 DIAGNOSIS — R600 Localized edema: Secondary | ICD-10-CM

## 2023-02-17 DIAGNOSIS — M25612 Stiffness of left shoulder, not elsewhere classified: Secondary | ICD-10-CM

## 2023-02-17 DIAGNOSIS — M6281 Muscle weakness (generalized): Secondary | ICD-10-CM

## 2023-02-17 NOTE — Therapy (Signed)
OUTPATIENT PHYSICAL THERAPY TREATMENT NOTE   Patient Name: [name] MRN: [id number] DOB:[birth year], 67 y.o., male Today's Date: 02/17/2023  END OF SESSION:  PT End of Session - 02/17/23 1521     Visit Number 4    Number of Visits 17    Date for PT Re-Evaluation 03/23/23    Authorization Type [employer] MCR    PT Start Time 1530    PT Stop Time 1610    PT Time Calculation (min) 40 min    Activity Tolerance Patient tolerated treatment well    Behavior During Therapy WFL for tasks assessed/performed               Past Medical History:  Diagnosis Date   Abdominal pain, epigastric 03/31/2009   Acute on chronic systolic heart failure (HCC) 03/02/2009   ANEMIA, OTHER UNSPEC 04/03/2008   AUTOMATIC IMPLANTABLE CARDIAC DEFIBRILLATOR SITU 02/26/2009   on old heart   Blood transfusion without reported diagnosis    CHF (congestive heart failure) (HCC)    CORONARY ARTERY DISEASE 04/25/2007   DIABETES MELLITUS, TYPE II 12/13/2008   no meds, diet contolled   GERD (gastroesophageal reflux disease)    HYPERLIPIDEMIA 04/25/2007   Hypertension    HYPERTENSION, HX OF 05/14/2008   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   MYOCARDIAL INFARCTION, 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   OBESITY 02/26/2009   Thyroid disease    THYROID DISEASE, HX OF 05/14/2008   VENTRICULAR TACHYCARDIA 02/26/2009   Past Surgical History:  Procedure Laterality Date   ANGIOPLASTY     with stent   CARDIAC ASSIST DEVICE REMOVAL     CARDIAC CATHETERIZATION     COLONOSCOPY     CORONARY ARTERY BYPASS GRAFT     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   LEFT VENTRICULAR ASSIST DEVICE  05/2009   [facility]   Patient Active Problem List   Diagnosis Date Noted   Immunosuppressed status (HCC) 02/23/2019   Chronic abdominal pain 12/27/2015   Heart transplanted (HCC) 06/19/2014   GERD (gastroesophageal reflux disease) 06/19/2014   Osteoarthritis 06/19/2014   BPH (benign prostatic hypertrophy) 02/26/2014   History of adenomatous polyp of colon  09/17/2013   Chronic systolic heart failure (HCC) 03/02/2009   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   AUTOMATIC IMPLANTABLE CARDIAC DEFIBRILLATOR SITU 02/26/2009   Diabetes mellitus type II, controlled (HCC) 12/13/2008   Hypothyroidism 05/14/2008   Essential hypertension 05/14/2008   ANEMIA, OTHER UNSPEC 04/03/2008   Hyperlipidemia associated with type 2 diabetes mellitus (HCC) 04/25/2007   Coronary atherosclerosis 04/25/2007    PCP: Shelva Majestic, MD  REFERRING PROVIDER: Richardean Sale, DO  REFERRING DIAG:  [phone] (ICD-10-CM) - Chronic left shoulder pain M19.012 (ICD-10-CM) - Primary osteoarthritis of left shoulder E11.618,M75.00 (ICD-10-CM) - 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 States he has not yet worked on [employer].    Hand dominance: Right  PERTINENT HISTORY: Heart transplant, DM II, HTN  PAIN:  Are you having pain?  No: NPRS scale: 0/10 Worst: 8/10 Pain location: L shoulder  Pain description: sharp, tight Aggravating factors: overhead reaching, cross arm Relieving factors: rest, heat  PRECAUTIONS: None  WEIGHT BEARING RESTRICTIONS: No  FALLS:  Has patient fallen in last 6 months? No  LIVING ENVIRONMENT: Lives with: lives with their family Lives in: House/apartment  OCCUPATION: Retired  PLOF: Independent  PATIENT GOALS:I mprove L shoulder mobility with overhead reaching into cabinets  OBJECTIVE:   DIAGNOSTIC FINDINGS:   N/A  PATIENT SURVEYS:  FOTO: 53% function; 65% predicted  COGNITION: Overall cognitive status: Within functional limits for tasks assessed     SENSATION: WFL  POSTURE: Rounded shoulders, fwd head  UPPER EXTREMITY ROM:   Active ROM Right eval Left eval  Shoulder flexion WFL 95  Shoulder extension    Shoulder abduction WFL 70  Shoulder adduction    Shoulder internal rotation WFL 30  Shoulder external rotation WFL 15  Elbow flexion    Elbow extension    Wrist flexion    Wrist extension    Wrist ulnar deviation    Wrist radial deviation    Wrist pronation    Wrist supination    (Blank rows = not tested)  UPPER EXTREMITY MMT:  MMT Right eval Left eval  Shoulder flexion    Shoulder extension    Shoulder abduction    Shoulder adduction    Shoulder internal rotation    Shoulder external rotation    Middle trapezius    Lower trapezius    Elbow flexion    Elbow extension    Wrist flexion    Wrist extension    Wrist ulnar deviation    Wrist radial deviation    Wrist pronation    Wrist supination    Grip strength (lbs)    (Blank rows = not tested)  SHOULDER SPECIAL TESTS: Impingement tests: DNT SLAP lesions: DNT Instability tests: DNT Rotator cuff assessment: DNT Biceps assessment: DNT  JOINT MOBILITY TESTING:  Decreasd mobility in capsular pattern  PALPATION:  TTP to L deltoid, R infraspinatus    TREATMENT: [facility] Adult PT Treatment:                                                DATE: 02/17/23 Therapeutic Exercise: UBE lvl 1.0 x 2'/2' fwd/bwd while taking subjective Pulleys shoulder flexion x 2 min Lifting 2# weight into cabinet flexion 2x10 bottom/middle shelf - cues to decrease compensation of shoulder elevation Row 3x12 20# Pball roll up wall x 10 Supine pball flexion 2x15 Supine horizontal abd 3x10 blue band Supine D2 shoulder flexion 2x10 GTB L shoiulder flexion wall slide 2x10 L shoulder ER 2x10 GTB (difficult) Manual Therapy PROM  into L shoulder flex/abd/IR/ER  [facility] Adult PT Treatment:                                                DATE: 02/15/23 Therapeutic Exercise: UBE lvl 1.0 x 3'/3' fwd/bwd while taking subjective Lifting 2# weight  into cabinet flexion 2x10 bottom/middle shelf - cues to decrease compensation of shoulder elevation Pulley's flexion x 2 min  Row 3x10 20# L shoulder wall slide 2x10 (flexion) L shoulder wall walk x 10  Shoulder ER 2x10 RTB L Supine pball flexion 2x15 Supine horizontal abd 2x10 GTB Manual Therapy PROM into L shoulder flex/abd/IR/ER  [facility] Adult PT Treatment:                                                DATE: 02/08/2023 Therapeutic Exercise: UBE lvl 1.0 x 4 min while taking subjective Pulley's flexion x 2 min  Row 3x10 20# L shoulder wall slide 2x10 (flexion) L shoulder wall walk x 10  Shoulder ER 2x10 RTB L Supine pball flexion 2x15 Supine horizontal abd 3x10 GTB Supine shoulder flex x 10 L Manual Therapy PROM into L shoulder flex/abd/IR/ER  [facility] Adult PT Treatment:                                                DATE: 01/26/2023 Therapeutic Exercise: Supine dow flexion x 5 Supine horizontal abd x 5 GTB Seated shoulder ER AAROM with dow x 5 L Wall slide shoulder flex/abd x 5 each L Standing row x 5 GTB  PATIENT EDUCATION: Education details: continue HEP Person educated: Patient Education method: Explanation, Demonstration, and Handouts Education comprehension: verbalized understanding and returned demonstration  HOME EXERCISE PROGRAM: Access Code: [email or web address] URL: [email or web address] Date: 01/26/2023 Prepared by: Edwinna Areola  Exercises - Supine Shoulder Flexion Extension AAROM with Dowel  - 1 x daily - 7 x weekly - 2 sets - 10 reps - 5 sec hold - Supine Shoulder Horizontal Abduction with Resistance  - 1 x daily - 7 x weekly - 3 sets - 10 reps - green band hold - Seated Shoulder External Rotation AAROM with Dowel  - 1 x daily - 7 x weekly - 2 sets  - 10 reps - 5 sec hold - Shoulder Flexion Wall Slide with Towel  - 1 x daily - 7 x weekly - 2 sets - 10 reps - Standing Shoulder Abduction Slides at Wall  - 1 x daily - 7 x weekly - 2 sets - 10 reps - Standing Shoulder Row with Anchored Resistance  - 1 x daily - 7 x weekly - 3 sets - 10 reps - green band hold  ASSESSMENT:  CLINICAL IMPRESSION: Pt was able to complete all prescribed exercises with no adverse effect. Therapy focused on improving L shoulder strength and ROM. Is progressing as expected, will continue per POC.   EVAL- Patient is a 67 y.o. M who was seen today for physical therapy evaluation and treatment for L shoulder pain and stiffness. Physical findings are consistent with physician impression as pt has significant decrease in L shoulder ROM in capsular pattern. His FOTO score shows significant decrease in subjective functional ability below PLOF. Pt would benefit from skilled PT services, will continue per POC as prescribed and progress as able.   OBJECTIVE IMPAIRMENTS: decreased activity tolerance, decreased endurance, decreased mobility, decreased ROM, decreased strength, impaired UE functional use, and pain.   ACTIVITY LIMITATIONS: carrying, lifting, bathing, dressing, self feeding, reach over head, hygiene/grooming, and caring for others  PARTICIPATION LIMITATIONS:  meal prep, cleaning, driving, shopping, community activity, occupation, and yard work  PERSONAL FACTORS: Fitness and 3+ comorbidities: Heart transplant, DM II, HTN  are also affecting patient's functional outcome.    GOALS: Goals reviewed with patient? No  SHORT TERM GOALS: Target date: 02/16/2023   Pt will be compliant and knowledgeable with initial HEP for improved comfort and carryover Baseline: initial HEP given  Goal status: MET Pt reports adherence 02/15/23  2.  Pt will self report left shoulder pain no greater than 6/10 for improved comfort and functional ability Baseline: 10/10 at worst Goal  status: Ongoing Pt reports 8/10 at worst   LONG TERM GOALS: Target date: 03/23/2023   Pt will improve FOTO function score to no less than 65% as proxy for functional improvement with home ADLs and community activities  Baseline: 53% function Goal status: INITIAL   2.  Pt will self report left shoulder pain no greater than 3/10 for improved comfort and functional ability Baseline: 10/10 at worst Goal status: INITIAL   3.  Pt will improve L shoulder flex/abd AROM to no less than 125 degrees for improved functional ability for overhead reaching into cabinets  Baseline: see ROM chart Goal status: INITIAL  4.  Pt will improve L shoulder strength to at least 4/5 for all tested motions for improved stability with overhead activities  Baseline: see MMT chart Goal status: INITIAL  PLAN:  PT FREQUENCY: 1-2x/week  PT DURATION: 8 weeks  PLANNED INTERVENTIONS: Therapeutic exercises, Therapeutic activity, Neuromuscular re-education, Balance training, Gait training, Patient/Family education, Self Care, Joint mobilization, Dry Needling, Electrical stimulation, Cryotherapy, Moist heat, Vasopneumatic device, and Re-evaluation  PLAN FOR NEXT SESSION: assess HEP response, strength and AAROM, PROM to L shoulder  Referring diagnosis?  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 (if different than referring diagnosis)  Stiffness of left shoulder, not elsewhere classified  Muscle weakness (generalized) Localized edema  What was this (referring dx) caused by? []  Surgery []  Fall [x]  Ongoing issue []  Arthritis []  Other: ____________  Laterality: []  Rt [x]  Lt []  Both  Check all possible CPT codes:  *CHOOSE 10 OR LESS*    [x]  97110 (Therapeutic Exercise)  []  92507 (SLP Treatment)  [x]  97112 (Neuro  Re-ed)   []  92526 (Swallowing Treatment)   [x]  97116 (Gait Training)   []  [id number] (Cognitive Training, 1st 15 minutes) [x]  97140 (Manual Therapy)   []  97130 (Cognitive Training, each add'l 15 minutes)  [x]  16109 (Re-evaluation)                              []  Other, List CPT Code ____________  [x]  97530 (Therapeutic Activities)     [x]  97535 (Self Care)   []  All codes above (97110 - 97535)  []  97012 (Mechanical Traction)  [x]  97014 (E-stim Unattended)  [x]  97032 (E-stim manual)  []  97033 (Ionto)  []  97035 (Ultrasound) []  97750 (Physical Performance Training) []  [id number] (Aquatic Therapy) [x]  97016 (Vasopneumatic Device) []  [id number] (Paraffin) []  97034 (Contrast Bath) []  97597 (Wound Care 1st 20 sq cm) []  97598 (Wound Care each add'l 20 sq cm) []  97760 (Orthotic Fabrication, Fitting, Training Initial) []  [id number] (Prosthetic Management and Training Initial) []  [id number] (Orthotic or Prosthetic Training/ Modification Subsequent)   Eloy End, PT 02/17/2023, 4:13 PM

## 2023-02-21 NOTE — Unmapped (Signed)
Called pt to get labbs drawn this week per [facility], lab slip sent by [facility] via MyChart.

## 2023-02-22 ENCOUNTER — Ambulatory Visit: Payer: Medicare HMO

## 2023-02-22 DIAGNOSIS — R600 Localized edema: Secondary | ICD-10-CM

## 2023-02-22 DIAGNOSIS — M25612 Stiffness of left shoulder, not elsewhere classified: Secondary | ICD-10-CM

## 2023-02-22 DIAGNOSIS — M6281 Muscle weakness (generalized): Secondary | ICD-10-CM

## 2023-02-22 NOTE — Therapy (Signed)
OUTPATIENT PHYSICAL THERAPY TREATMENT NOTE   Patient Name: [name] MRN: [id number] DOB:1956/03/12, 67 y.o., male Today's Date: 02/22/2023  END OF SESSION:  PT End of Session - 02/22/23 1524     Visit Number 5    Number of Visits 17    Date for PT Re-Evaluation 03/23/23    Authorization Type [employer] MCR    PT Start Time 1530    PT Stop Time 1610    PT Time Calculation (min) 40 min    Activity Tolerance Patient tolerated treatment well    Behavior During Therapy WFL for tasks assessed/performed             Past Medical History:  Diagnosis Date   Abdominal pain, epigastric 03/31/2009   Acute on chronic systolic heart failure (HCC) 03/02/2009   ANEMIA, OTHER UNSPEC 04/03/2008   AUTOMATIC IMPLANTABLE CARDIAC DEFIBRILLATOR SITU 02/26/2009   on old heart   Blood transfusion without reported diagnosis    CHF (congestive heart failure) (HCC)    CORONARY ARTERY DISEASE 04/25/2007   DIABETES MELLITUS, TYPE II 12/13/2008   no meds, diet contolled   GERD (gastroesophageal reflux disease)    HYPERLIPIDEMIA 04/25/2007   Hypertension    HYPERTENSION, HX OF 05/14/2008   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   MYOCARDIAL INFARCTION, 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   OBESITY 02/26/2009   Thyroid disease    THYROID DISEASE, HX OF 05/14/2008   VENTRICULAR TACHYCARDIA 02/26/2009   Past Surgical History:  Procedure Laterality Date   ANGIOPLASTY     with stent   CARDIAC ASSIST DEVICE REMOVAL     CARDIAC CATHETERIZATION     COLONOSCOPY     CORONARY ARTERY BYPASS GRAFT     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   LEFT VENTRICULAR ASSIST DEVICE  05/2009   [facility]   Patient Active Problem List   Diagnosis Date Noted   Immunosuppressed status (HCC) 02/23/2019   Chronic abdominal pain 12/27/2015   Heart transplanted (HCC) 06/19/2014   GERD (gastroesophageal reflux disease) 06/19/2014   Osteoarthritis 06/19/2014   BPH (benign prostatic hypertrophy) 02/26/2014   History of adenomatous polyp of colon  09/17/2013   Chronic systolic heart failure (HCC) 03/02/2009   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   AUTOMATIC IMPLANTABLE CARDIAC DEFIBRILLATOR SITU 02/26/2009   Diabetes mellitus type II, controlled (HCC) 12/13/2008   Hypothyroidism 05/14/2008   Essential hypertension 05/14/2008   ANEMIA, OTHER UNSPEC 04/03/2008   Hyperlipidemia associated with type 2 diabetes mellitus (HCC) 04/25/2007   Coronary atherosclerosis 04/25/2007    PCP: Shelva Majestic, MD  REFERRING PROVIDER: Richardean Sale, DO  REFERRING DIAG:  [phone] (ICD-10-CM) - Chronic left shoulder pain M19.012 (ICD-10-CM) - Primary osteoarthritis of left shoulder E11.618,M75.00 (ICD-10-CM) - 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   Hand dominance: Right  PERTINENT HISTORY: Heart transplant, DM II, HTN  PAIN:  Are you having pain?  No: NPRS scale: 2/10 Worst: 8/10 Pain location: L shoulder  Pain description: sharp, tight Aggravating factors: overhead reaching, cross arm Relieving factors: rest, heat  PRECAUTIONS: None  WEIGHT BEARING RESTRICTIONS: No  FALLS:  Has patient fallen in last 6 months? No  LIVING ENVIRONMENT: Lives with: lives with their family Lives in: House/apartment  OCCUPATION: Retired  PLOF: Independent  PATIENT GOALS:I mprove L shoulder mobility with overhead reaching into cabinets  OBJECTIVE:   DIAGNOSTIC FINDINGS:  N/A  PATIENT SURVEYS:  FOTO: 53% function; 65%  predicted  COGNITION: Overall cognitive status: Within functional limits for tasks assessed     SENSATION: WFL  POSTURE: Rounded shoulders, fwd head  UPPER EXTREMITY ROM:   Active ROM Right eval Left eval  Shoulder flexion WFL 95  Shoulder extension    Shoulder abduction WFL 70  Shoulder adduction    Shoulder internal rotation WFL 30  Shoulder external rotation WFL 15  Elbow flexion    Elbow extension    Wrist flexion    Wrist extension    Wrist ulnar deviation    Wrist radial deviation    Wrist pronation    Wrist supination    (Blank rows = not tested)  UPPER EXTREMITY MMT:  MMT Right eval Left eval  Shoulder flexion    Shoulder extension    Shoulder abduction    Shoulder adduction    Shoulder internal rotation    Shoulder external rotation    Middle trapezius    Lower trapezius    Elbow flexion    Elbow extension    Wrist flexion    Wrist extension    Wrist ulnar deviation    Wrist radial deviation    Wrist pronation    Wrist supination    Grip strength (lbs)    (Blank rows = not tested)  SHOULDER SPECIAL TESTS: Impingement tests: DNT SLAP lesions: DNT Instability tests: DNT Rotator cuff assessment: DNT Biceps assessment: DNT  JOINT MOBILITY TESTING:  Decreasd mobility in capsular pattern  PALPATION:  TTP to L deltoid, R infraspinatus    TREATMENT: [facility] Adult PT Treatment:                                                DATE: 02/22/23 Therapeutic Exercise: UBE lvl 2.0 x 3'/3' fwd/bwd while taking subjective Lifting 2# weight into cabinet flexion 2x10 bottom/middle shelf - cues to decrease compensation of shoulder elevation Row 3x12 20# Pball roll up wall x 10 Supine horizontal abd 3x10 blue band Supine D2 shoulder flexion 2x10 GTB L shoulder flexion wall slide 2x10 Seated double ER GTB 2x10 (difficult on L) Manual Therapy: PROM into L shoulder flex/abd/IR/ER  [facility] Adult PT Treatment:                                                 DATE: 02/17/23 Therapeutic Exercise: UBE lvl 1.0 x 2'/2' fwd/bwd while taking subjective Pulleys shoulder flexion x 2 min Lifting 2# weight into cabinet flexion 2x10 bottom/middle shelf - cues to decrease compensation of shoulder elevation  Row 3x12 20# Pball roll up wall x 10 Supine pball flexion 2x15 Supine horizontal abd 3x10 blue band Supine D2 shoulder flexion 2x10 GTB L shoiulder flexion wall slide 2x10 L shoulder ER 2x10 GTB (difficult) Manual Therapy PROM into L shoulder flex/abd/IR/ER  [facility] Adult PT Treatment:                                                DATE: 02/15/23 Therapeutic Exercise: UBE lvl 1.0 x 3'/3' fwd/bwd while taking subjective Lifting 2# weight into cabinet flexion 2x10 bottom/middle shelf - cues to decrease compensation of shoulder elevation Pulley's flexion x 2 min  Row 3x10 20# L shoulder wall slide 2x10 (flexion) L shoulder wall walk x 10  Shoulder ER 2x10 RTB L Supine pball flexion 2x15 Supine horizontal abd 2x10 GTB Manual Therapy PROM into L shoulder flex/abd/IR/ER   PATIENT EDUCATION: Education details: continue HEP Person educated: Patient Education method: Explanation, Demonstration, and Handouts Education comprehension: verbalized understanding and returned demonstration  HOME EXERCISE PROGRAM: Access Code: [email or web address] URL: [email or web address] Date: 01/26/2023 Prepared by: Edwinna Areola  Exercises - Supine Shoulder Flexion Extension AAROM with Dowel  - 1 x daily - 7 x weekly - 2 sets - 10 reps - 5 sec hold - Supine Shoulder Horizontal Abduction with Resistance  - 1 x daily - 7 x weekly - 3 sets - 10 reps - green band hold - Seated Shoulder External Rotation AAROM with Dowel  - 1 x daily - 7 x weekly - 2 sets - 10 reps - 5 sec hold - Shoulder Flexion Wall Slide with Towel  - 1 x daily - 7 x weekly - 2 sets - 10 reps - Standing Shoulder Abduction Slides at Wall  - 1 x daily - 7 x weekly - 2 sets - 10 reps - Standing Shoulder  Row with Anchored Resistance  - 1 x daily - 7 x weekly - 3 sets - 10 reps - green band hold  ASSESSMENT:  CLINICAL IMPRESSION: Patient presents to PT reporting mild pain in his L shoulder that he reports most occurs with OH lifting. Session today continued to focus on RTC and periscapular strengthening with most difficult being ER on the Lt with patient reporting pain and muscle fatigue. Patient was able to tolerate all prescribed exercises with no adverse effects. Patient continues to benefit from skilled PT services and should be progressed as able to improve functional independence.    EVAL- Patient is a 67 y.o. M who was seen today for physical therapy evaluation and treatment for L shoulder pain and stiffness. Physical findings are consistent with physician impression as pt has significant decrease in L shoulder ROM in capsular pattern. His FOTO score shows significant decrease in subjective functional ability below PLOF. Pt would benefit from skilled PT services, will continue per POC as prescribed and progress as able.   OBJECTIVE IMPAIRMENTS: decreased activity tolerance, decreased endurance, decreased mobility, decreased ROM, decreased strength, impaired UE functional use, and pain.   ACTIVITY LIMITATIONS: carrying, lifting, bathing, dressing, self feeding, reach over head, hygiene/grooming, and caring for others  PARTICIPATION LIMITATIONS: meal prep, cleaning, driving, shopping, community activity, occupation, and yard work  PERSONAL FACTORS: Fitness and 3+ comorbidities: Heart transplant, DM II, HTN  are also affecting patient's functional outcome.    GOALS: Goals reviewed with patient? No  SHORT TERM GOALS: Target  date: 02/16/2023   Pt will be compliant and knowledgeable with initial HEP for improved comfort and carryover Baseline: initial HEP given  Goal status: MET Pt reports adherence 02/15/23  2.  Pt will self report left shoulder pain no greater than 6/10 for improved  comfort and functional ability Baseline: 10/10 at worst Goal status: Ongoing Pt reports 8/10 at worst   LONG TERM GOALS: Target date: 03/23/2023   Pt will improve FOTO function score to no less than 65% as proxy for functional improvement with home ADLs and community activities  Baseline: 53% function Goal status: INITIAL   2.  Pt will self report left shoulder pain no greater than 3/10 for improved comfort and functional ability Baseline: 10/10 at worst Goal status: INITIAL   3.  Pt will improve L shoulder flex/abd AROM to no less than 125 degrees for improved functional ability for overhead reaching into cabinets  Baseline: see ROM chart Goal status: INITIAL  4.  Pt will improve L shoulder strength to at least 4/5 for all tested motions for improved stability with overhead activities  Baseline: see MMT chart Goal status: INITIAL  PLAN:  PT FREQUENCY: 1-2x/week  PT DURATION: 8 weeks  PLANNED INTERVENTIONS: Therapeutic exercises, Therapeutic activity, Neuromuscular re-education, Balance training, Gait training, Patient/Family education, Self Care, Joint mobilization, Dry Needling, Electrical stimulation, Cryotherapy, Moist heat, Vasopneumatic device, and Re-evaluation  PLAN FOR NEXT SESSION: assess HEP response, strength and AAROM, PROM to L shoulder  Referring diagnosis?  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 (if different than referring diagnosis)  Stiffness of left shoulder, not elsewhere classified  Muscle weakness (generalized) Localized edema  What was this (referring dx) caused by? []  Surgery []  Fall [x]  Ongoing issue []  Arthritis []  Other: ____________  Laterality: []  Rt [x]  Lt []  Both  Check all possible CPT codes:  *CHOOSE 10 OR LESS*    [x]  78295  (Therapeutic Exercise)  []  92507 (SLP Treatment)  [x]  97112 (Neuro Re-ed)   []  92526 (Swallowing Treatment)   [x]  97116 (Gait Training)   []  [id number] (Cognitive Training, 1st 15 minutes) [x]  97140 (Manual Therapy)   []  97130 (Cognitive Training, each add'l 15 minutes)  [x]  97164 (Re-evaluation)                              []  Other, List CPT Code ____________  [x]  97530 (Therapeutic Activities)     [x]  97535 (Self Care)   []  All codes above (97110 - 97535)  []  97012 (Mechanical Traction)  [x]  97014 (E-stim Unattended)  [x]  97032 (E-stim manual)  []  97033 (Ionto)  []  97035 (Ultrasound) []  97750 (Physical Performance Training) []  [id number] (Aquatic Therapy) [x]  97016 (Vasopneumatic Device) []  [id number] (Paraffin) []  97034 (Contrast Bath) []  97597 (Wound Care 1st 20 sq cm) []  97598 (Wound Care each add'l 20 sq cm) []  97760 (Orthotic Fabrication, Fitting, Training Initial) []  [id number] (Prosthetic Management and Training Initial) []  [id number] (Orthotic or Prosthetic Training/ Modification Subsequent)   Berta Minor, PTA 02/22/2023, 4:12 PM

## 2023-02-24 ENCOUNTER — Ambulatory Visit: Payer: Medicare HMO

## 2023-02-24 DIAGNOSIS — M25612 Stiffness of left shoulder, not elsewhere classified: Secondary | ICD-10-CM

## 2023-02-24 DIAGNOSIS — M6281 Muscle weakness (generalized): Secondary | ICD-10-CM

## 2023-02-24 DIAGNOSIS — R600 Localized edema: Secondary | ICD-10-CM | POA: Diagnosis not present

## 2023-02-24 NOTE — Therapy (Signed)
OUTPATIENT PHYSICAL THERAPY TREATMENT NOTE   Patient Name: [name] MRN: [id number] DOB:03/06/1956, 67 y.o., male Today's Date: 02/24/2023  END OF SESSION:  PT End of Session - 02/24/23 1524     Visit Number 6    Number of Visits 17    Date for PT Re-Evaluation 03/23/23    Authorization Type [employer] MCR    PT Start Time 1530    PT Stop Time 1608    PT Time Calculation (min) 38 min    Activity Tolerance Patient tolerated treatment well    Behavior During Therapy WFL for tasks assessed/performed              Past Medical History:  Diagnosis Date   Abdominal pain, epigastric 03/31/2009   Acute on chronic systolic heart failure (HCC) 03/02/2009   ANEMIA, OTHER UNSPEC 04/03/2008   AUTOMATIC IMPLANTABLE CARDIAC DEFIBRILLATOR SITU 02/26/2009   on old heart   Blood transfusion without reported diagnosis    CHF (congestive heart failure) (HCC)    CORONARY ARTERY DISEASE 04/25/2007   DIABETES MELLITUS, TYPE II 12/13/2008   no meds, diet contolled   GERD (gastroesophageal reflux disease)    HYPERLIPIDEMIA 04/25/2007   Hypertension    HYPERTENSION, HX OF 05/14/2008   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   MYOCARDIAL INFARCTION, 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   OBESITY 02/26/2009   Thyroid disease    THYROID DISEASE, HX OF 05/14/2008   VENTRICULAR TACHYCARDIA 02/26/2009   Past Surgical History:  Procedure Laterality Date   ANGIOPLASTY     with stent   CARDIAC ASSIST DEVICE REMOVAL     CARDIAC CATHETERIZATION     COLONOSCOPY     CORONARY ARTERY BYPASS GRAFT     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   LEFT VENTRICULAR ASSIST DEVICE  05/2009   [facility]   Patient Active Problem List   Diagnosis Date Noted   Immunosuppressed status (HCC) 02/23/2019   Chronic abdominal pain 12/27/2015   Heart transplanted (HCC) 06/19/2014   GERD (gastroesophageal reflux disease) 06/19/2014   Osteoarthritis 06/19/2014   BPH (benign prostatic hypertrophy) 02/26/2014   History of adenomatous polyp of colon  09/17/2013   Chronic systolic heart failure (HCC) 03/02/2009   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   AUTOMATIC IMPLANTABLE CARDIAC DEFIBRILLATOR SITU 02/26/2009   Diabetes mellitus type II, controlled (HCC) 12/13/2008   Hypothyroidism 05/14/2008   Essential hypertension 05/14/2008   ANEMIA, OTHER UNSPEC 04/03/2008   Hyperlipidemia associated with type 2 diabetes mellitus (HCC) 04/25/2007   Coronary atherosclerosis 04/25/2007    PCP: Shelva Majestic, MD  REFERRING PROVIDER: Richardean Sale, DO  REFERRING DIAG:  [phone] (ICD-10-CM) - Chronic left shoulder pain M19.012 (ICD-10-CM) - Primary osteoarthritis of left shoulder E11.618,M75.00 (ICD-10-CM) - 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   Hand dominance: Right  PERTINENT HISTORY: Heart transplant, DM II, HTN  PAIN:  Are you having pain?  No: NPRS scale: 0/10 Worst: 8/10 Pain location: L shoulder  Pain description: sharp, tight Aggravating factors: overhead reaching, cross arm Relieving factors: rest, heat  PRECAUTIONS: None  WEIGHT BEARING RESTRICTIONS: No  FALLS:  Has patient fallen in last 6 months? No  LIVING ENVIRONMENT: Lives with: lives with their family Lives in: House/apartment  OCCUPATION: Retired  PLOF: Independent  PATIENT GOALS: Improve L shoulder mobility with overhead reaching into cabinets  OBJECTIVE:   DIAGNOSTIC FINDINGS:  N/A  PATIENT SURVEYS:  FOTO: 53% function; 65%  predicted  COGNITION: Overall cognitive status: Within functional limits for tasks assessed     SENSATION: WFL  POSTURE: Rounded shoulders, fwd head  UPPER EXTREMITY ROM:   Active ROM Right eval Left eval  Shoulder flexion WFL 95  Shoulder extension    Shoulder abduction WFL 70  Shoulder adduction    Shoulder internal rotation WFL 30  Shoulder external rotation WFL 15  Elbow flexion    Elbow extension    Wrist flexion    Wrist extension    Wrist ulnar deviation    Wrist radial deviation    Wrist pronation    Wrist supination    (Blank rows = not tested)  UPPER EXTREMITY MMT:  MMT Right eval Left eval  Shoulder flexion    Shoulder extension    Shoulder abduction    Shoulder adduction    Shoulder internal rotation    Shoulder external rotation    Middle trapezius    Lower trapezius    Elbow flexion    Elbow extension    Wrist flexion    Wrist extension    Wrist ulnar deviation    Wrist radial deviation    Wrist pronation    Wrist supination    Grip strength (lbs)    (Blank rows = not tested)  SHOULDER SPECIAL TESTS: Impingement tests: DNT SLAP lesions: DNT Instability tests: DNT Rotator cuff assessment: DNT Biceps assessment: DNT  JOINT MOBILITY TESTING:  Decreasd mobility in capsular pattern  PALPATION:  TTP to L deltoid, R infraspinatus    TREATMENT: [facility] Adult PT Treatment:                                                DATE: 02/24/23 Therapeutic Exercise: UBE lvl 2.0 x 3'/3' fwd/bwd while taking subjective Lifting 3# weight into cabinet flexion x10 bottom/middle shelf - cues to decrease compensation of shoulder elevation and right trunk lean Row 3x12 20# Shoulder extension 17# 2x10 Pball roll up wall x 10 Supine horizontal abd 3x10 blue band Supine D2 shoulder flexion 2x10 GTB Seated double ER GTB 2x10 (difficult on L) Sidelying trio: ER, flexion, abduction 500g ball x15   [facility] Adult PT Treatment:                                                 DATE: 02/22/23 Therapeutic Exercise: UBE lvl 2.0 x 3'/3' fwd/bwd while taking subjective Lifting 2# weight into cabinet flexion 2x10 bottom/middle shelf - cues to decrease compensation of  shoulder elevation Row 3x12 20# Pball roll up wall x 10 Supine horizontal abd 3x10 blue band Supine D2 shoulder flexion 2x10 GTB L shoulder flexion wall slide 2x10 Seated double ER GTB 2x10 (difficult on L) Manual Therapy: PROM into L shoulder flex/abd/IR/ER  [facility] Adult PT Treatment:                                                DATE: 02/17/23 Therapeutic Exercise: UBE lvl 1.0 x 2'/2' fwd/bwd while taking subjective Pulleys shoulder flexion x 2 min Lifting 2# weight into cabinet flexion 2x10 bottom/middle shelf - cues to decrease compensation of shoulder elevation Row 3x12 20# Pball roll up wall x 10 Supine pball flexion 2x15 Supine horizontal abd 3x10 blue band Supine D2 shoulder flexion 2x10 GTB L shoiulder flexion wall slide 2x10 L shoulder ER 2x10 GTB (difficult) Manual Therapy PROM into L shoulder flex/abd/IR/ER    PATIENT EDUCATION: Education details: continue HEP Person educated: Patient Education method: Explanation, Demonstration, and Handouts Education comprehension: verbalized understanding and returned demonstration  HOME EXERCISE PROGRAM: Access Code: [email or web address] URL: [email or web address] Date: 01/26/2023 Prepared by: Edwinna Areola  Exercises - Supine Shoulder Flexion Extension AAROM with Dowel  - 1 x daily - 7 x weekly - 2 sets - 10 reps - 5 sec hold - Supine Shoulder Horizontal Abduction with Resistance  - 1 x daily - 7 x weekly - 3 sets - 10 reps - green band hold - Seated Shoulder External Rotation AAROM with Dowel  - 1 x daily - 7 x weekly - 2 sets - 10 reps - 5 sec hold - Shoulder Flexion Wall Slide with Towel  - 1 x daily - 7 x weekly - 2 sets - 10 reps - Standing Shoulder Abduction Slides at Wall  - 1 x daily - 7 x weekly - 2 sets - 10  reps - Standing Shoulder Row with Anchored Resistance  - 1 x daily - 7 x weekly - 3 sets - 10 reps - green band hold  ASSESSMENT:  CLINICAL IMPRESSION: Patient presents to PT reporting no current pain in his Lt shoulder, but reports increased overall fatigue today. Session today focused on RTC and periscapular strengthening with patient reporting increased muscle fatigue by end of session. Patient was able to tolerate all prescribed exercises with no adverse effects. Patient continues to benefit from skilled PT services and should be progressed as able to improve functional independence.   EVAL- Patient is a 67 y.o. M who was seen today for physical therapy evaluation and treatment for L shoulder pain and stiffness. Physical findings are consistent with physician impression as pt has significant decrease in L shoulder ROM in capsular pattern. His FOTO score shows significant decrease in subjective functional ability below PLOF. Pt would benefit from skilled PT services, will continue per POC as prescribed and progress as able.   OBJECTIVE IMPAIRMENTS: decreased activity tolerance, decreased endurance, decreased mobility, decreased ROM, decreased strength, impaired UE functional use, and pain.   ACTIVITY LIMITATIONS: carrying, lifting, bathing, dressing, self feeding, reach over head, hygiene/grooming, and caring for others  PARTICIPATION LIMITATIONS: meal prep, cleaning, driving, shopping, community activity, occupation, and yard work  PERSONAL FACTORS: Fitness and 3+ comorbidities: Heart transplant, DM II, HTN  are also affecting patient's functional outcome.    GOALS: Goals reviewed with patient? No  SHORT TERM GOALS: Target date:  02/16/2023   Pt will be compliant and knowledgeable with initial HEP for improved comfort and carryover Baseline: initial HEP given  Goal status: MET Pt reports adherence 02/15/23  2.  Pt will self report left shoulder pain no greater than 6/10 for improved  comfort and functional ability Baseline: 10/10 at worst Goal status: Ongoing Pt reports 8/10 at worst   LONG TERM GOALS: Target date: 03/23/2023   Pt will improve FOTO function score to no less than 65% as proxy for functional improvement with home ADLs and community activities  Baseline: 53% function Goal status: INITIAL   2.  Pt will self report left shoulder pain no greater than 3/10 for improved comfort and functional ability Baseline: 10/10 at worst Goal status: INITIAL   3.  Pt will improve L shoulder flex/abd AROM to no less than 125 degrees for improved functional ability for overhead reaching into cabinets  Baseline: see ROM chart Goal status: INITIAL  4.  Pt will improve L shoulder strength to at least 4/5 for all tested motions for improved stability with overhead activities  Baseline: see MMT chart Goal status: INITIAL  PLAN:  PT FREQUENCY: 1-2x/week  PT DURATION: 8 weeks  PLANNED INTERVENTIONS: Therapeutic exercises, Therapeutic activity, Neuromuscular re-education, Balance training, Gait training, Patient/Family education, Self Care, Joint mobilization, Dry Needling, Electrical stimulation, Cryotherapy, Moist heat, Vasopneumatic device, and Re-evaluation  PLAN FOR NEXT SESSION: assess HEP response, strength and AAROM, PROM to L shoulder  Referring diagnosis?  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 (if different than referring diagnosis)  Stiffness of left shoulder, not elsewhere classified  Muscle weakness (generalized) Localized edema  What was this (referring dx) caused by? []  Surgery []  Fall [x]  Ongoing issue []  Arthritis []  Other: ____________  Laterality: []  Rt [x]  Lt []  Both  Check all possible CPT codes:  *CHOOSE 10 OR LESS*    [x]  96045  (Therapeutic Exercise)  []  92507 (SLP Treatment)  [x]  97112 (Neuro Re-ed)   []  92526 (Swallowing Treatment)   [x]  97116 (Gait Training)   []  [phone] (Cognitive Training, 1st 15 minutes) [x]  97140 (Manual Therapy)   []  97130 (Cognitive Training, each add'l 15 minutes)  [x]  97164 (Re-evaluation)                              []  Other, List CPT Code ____________  [x]  97530 (Therapeutic Activities)     [x]  97535 (Self Care)   []  All codes above (97110 - 97535)  []  97012 (Mechanical Traction)  [x]  97014 (E-stim Unattended)  [x]  97032 (E-stim manual)  []  97033 (Ionto)  []  97035 (Ultrasound) []  97750 (Physical Performance Training) []  [id number] (Aquatic Therapy) [x]  97016 (Vasopneumatic Device) []  [id number] (Paraffin) []  97034 (Contrast Bath) []  97597 (Wound Care 1st 20 sq cm) []  97598 (Wound Care each add'l 20 sq cm) []  97760 (Orthotic Fabrication, Fitting, Training Initial) []  [id number] (Prosthetic Management and Training Initial) []  [id number] (Orthotic or Prosthetic Training/ Modification Subsequent)   Berta Minor, PTA 02/24/2023, 4:10 PM

## 2023-02-25 DIAGNOSIS — Z79899 Other long term (current) drug therapy: Secondary | ICD-10-CM | POA: Diagnosis not present

## 2023-02-25 DIAGNOSIS — Z48298 Encounter for aftercare following other organ transplant: Secondary | ICD-10-CM | POA: Diagnosis not present

## 2023-02-25 DIAGNOSIS — R7989 Other specified abnormal findings of blood chemistry: Secondary | ICD-10-CM | POA: Diagnosis not present

## 2023-02-25 DIAGNOSIS — Z941 Heart transplant status: Secondary | ICD-10-CM | POA: Diagnosis not present

## 2023-02-25 DIAGNOSIS — E559 Vitamin D deficiency, unspecified: Secondary | ICD-10-CM | POA: Diagnosis not present

## 2023-02-25 DIAGNOSIS — E785 Hyperlipidemia, unspecified: Secondary | ICD-10-CM | POA: Diagnosis not present

## 2023-03-01 ENCOUNTER — Ambulatory Visit: Payer: Medicare HMO

## 2023-03-01 DIAGNOSIS — M25612 Stiffness of left shoulder, not elsewhere classified: Secondary | ICD-10-CM

## 2023-03-01 DIAGNOSIS — M6281 Muscle weakness (generalized): Secondary | ICD-10-CM

## 2023-03-01 DIAGNOSIS — R600 Localized edema: Secondary | ICD-10-CM | POA: Diagnosis not present

## 2023-03-01 LAB — COMPREHENSIVE METABOLIC PANEL
ALBUMIN/GLOBULIN RATIO: 2.2 (calc) (ref 1.0–2.5)
ALBUMIN: 4.3 g/dL (ref 3.6–5.1)
ALKALINE PHOSPHATASE: 72 U/L (ref 35–144)
ALT (SGPT): 9 U/L (ref 9–46)
AST (SGOT): 7 U/L — ABNORMAL LOW (ref 10–35)
BILIRUBIN TOTAL: 0.8 mg/dL (ref 0.2–1.2)
BLOOD UREA NITROGEN: 27 mg/dL — ABNORMAL HIGH (ref 7–25)
BUN / CREAT RATIO: 19 (calc) (ref 6–22)
CALCIUM: 9.5 mg/dL (ref 8.6–10.3)
CHLORIDE: 105 mmol/L (ref 98–110)
CO2: 22 mmol/L (ref 20–32)
CREATININE: 1.44 mg/dL — ABNORMAL HIGH (ref 0.70–1.35)
EGFR CKD-EPI NON-AA MALE: 54 mL/min/{1.73_m2} — ABNORMAL LOW
GLOBULIN, TOTAL: 2 g/dL (ref 1.9–3.7)
GLUCOSE RANDOM: 132 mg/dL — ABNORMAL HIGH (ref 65–99)
POTASSIUM: 4.8 mmol/L (ref 3.5–5.3)
PROTEIN TOTAL: 6.3 g/dL (ref 6.1–8.1)
SODIUM: 135 mmol/L (ref 135–146)

## 2023-03-01 LAB — CBC W/ DIFFERENTIAL
BASOPHILS ABSOLUTE COUNT: 44 {cells}/uL (ref 0–200)
BASOPHILS RELATIVE PERCENT: 0.5 %
EOSINOPHILS ABSOLUTE COUNT: 52 {cells}/uL (ref 15–500)
EOSINOPHILS RELATIVE PERCENT: 0.6 %
HEMATOCRIT: 37.1 % — ABNORMAL LOW (ref 38.5–50.0)
HEMOGLOBIN: 12 g/dL — ABNORMAL LOW (ref 13.2–17.1)
LYMPHOCYTES ABSOLUTE COUNT: 1044 {cells}/uL (ref 850–3900)
LYMPHOCYTES RELATIVE PERCENT: 12 %
MEAN CORPUSCULAR HEMOGLOBIN CONC: 32.3 g/dL (ref 32.0–36.0)
MEAN CORPUSCULAR HEMOGLOBIN: 29.8 pg (ref 27.0–33.0)
MEAN CORPUSCULAR VOLUME: 92.1 fL (ref 80.0–100.0)
MEAN PLATELET VOLUME: 9.9 fL (ref 7.5–12.5)
MONOCYTES ABSOLUTE COUNT: 513 {cells}/uL (ref 200–950)
MONOCYTES RELATIVE PERCENT: 5.9 %
NEUTROPHILS ABSOLUTE COUNT: 7047 {cells}/uL (ref 1500–7800)
NEUTROPHILS RELATIVE PERCENT: 81 %
PLATELET COUNT: 279 10*3/uL (ref 140–400)
RED BLOOD CELL COUNT: 4.03 10*6/uL — ABNORMAL LOW (ref 4.20–5.80)
RED CELL DISTRIBUTION WIDTH: 13.4 % (ref 11.0–15.0)
WHITE BLOOD CELL COUNT: 8.7 10*3/uL (ref 3.8–10.8)

## 2023-03-01 LAB — LIPID PANEL
CHOLESTEROL/HDL RATIO SCREEN: 3.6 (calc)
CHOLESTEROL: 129 mg/dL
HDL CHOLESTEROL: 36 mg/dL — ABNORMAL LOW
LDL CHOLESTEROL CALCULATED: 70 mg/dL
NON-HDL CHOLESTEROL: 93 mg/dL
TRIGLYCERIDES: 146 mg/dL

## 2023-03-01 LAB — TACROLIMUS LEVEL, TROUGH: TACROLIMUS, TROUGH: 5.1 ug/L (ref 5.0–20.0)

## 2023-03-01 LAB — MAGNESIUM: MAGNESIUM: 1.6 mg/dL (ref 1.5–2.5)

## 2023-03-01 LAB — HEMOGLOBIN A1C: HEMOGLOBIN A1C: 6.7 % — ABNORMAL HIGH

## 2023-03-01 LAB — TSH: THYROID STIMULATING HORMONE: 0.89 m[IU]/L (ref 0.40–4.50)

## 2023-03-01 NOTE — Therapy (Signed)
OUTPATIENT PHYSICAL THERAPY TREATMENT NOTE   Patient Name: [name] MRN: [id number] DOB:August 03, 1956, 67 y.o., male Today's Date: 03/01/2023  END OF SESSION:  PT End of Session - 03/01/23 1212     Visit Number 7    Number of Visits 17    Date for PT Re-Evaluation 03/23/23    Authorization Type [employer] MCR    PT Start Time 1215    PT Stop Time 1253    PT Time Calculation (min) 38 min    Activity Tolerance Patient tolerated treatment well    Behavior During Therapy WFL for tasks assessed/performed              Past Medical History:  Diagnosis Date   Abdominal pain, epigastric 03/31/2009   Acute on chronic systolic heart failure (HCC) 03/02/2009   ANEMIA, OTHER UNSPEC 04/03/2008   AUTOMATIC IMPLANTABLE CARDIAC DEFIBRILLATOR SITU 02/26/2009   on old heart   Blood transfusion without reported diagnosis    CHF (congestive heart failure) (HCC)    CORONARY ARTERY DISEASE 04/25/2007   DIABETES MELLITUS, TYPE II 12/13/2008   no meds, diet contolled   GERD (gastroesophageal reflux disease)    HYPERLIPIDEMIA 04/25/2007   Hypertension    HYPERTENSION, HX OF 05/14/2008   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   MYOCARDIAL INFARCTION, 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   OBESITY 02/26/2009   Thyroid disease    THYROID DISEASE, HX OF 05/14/2008   VENTRICULAR TACHYCARDIA 02/26/2009   Past Surgical History:  Procedure Laterality Date   ANGIOPLASTY     with stent   CARDIAC ASSIST DEVICE REMOVAL     CARDIAC CATHETERIZATION     COLONOSCOPY     CORONARY ARTERY BYPASS GRAFT     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   LEFT VENTRICULAR ASSIST DEVICE  05/2009   [facility]   Patient Active Problem List   Diagnosis Date Noted   Immunosuppressed status (HCC) 02/23/2019   Chronic abdominal pain 12/27/2015   Heart transplanted (HCC) 06/19/2014   GERD (gastroesophageal reflux disease) 06/19/2014   Osteoarthritis 06/19/2014   BPH (benign prostatic hypertrophy) 02/26/2014   History of adenomatous polyp of colon  09/17/2013   Chronic systolic heart failure (HCC) 03/02/2009   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   AUTOMATIC IMPLANTABLE CARDIAC DEFIBRILLATOR SITU 02/26/2009   Diabetes mellitus type II, controlled (HCC) 12/13/2008   Hypothyroidism 05/14/2008   Essential hypertension 05/14/2008   ANEMIA, OTHER UNSPEC 04/03/2008   Hyperlipidemia associated with type 2 diabetes mellitus (HCC) 04/25/2007   Coronary atherosclerosis 04/25/2007    PCP: Shelva Majestic, MD  REFERRING PROVIDER: Richardean Sale, DO  REFERRING DIAG:  [phone] (ICD-10-CM) - Chronic left shoulder pain M19.012 (ICD-10-CM) - Primary osteoarthritis of left shoulder E11.618,M75.00 (ICD-10-CM) - 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   Hand dominance: Right  PERTINENT HISTORY: Heart transplant, DM II, HTN  PAIN:  Are you having pain?  No: NPRS scale: 0/10 Worst: 8/10 Pain location: L shoulder  Pain description: sharp, tight Aggravating factors: overhead reaching, cross arm Relieving factors: rest, heat  PRECAUTIONS: None  WEIGHT BEARING RESTRICTIONS: No  FALLS:  Has patient fallen in last 6 months? No  LIVING ENVIRONMENT: Lives with: lives with their family Lives in: House/apartment  OCCUPATION: Retired  PLOF: Independent  PATIENT GOALS: Improve L shoulder mobility with overhead reaching into cabinets  OBJECTIVE:   DIAGNOSTIC FINDINGS:  N/A  PATIENT SURVEYS:  FOTO: 53%  function; 65% predicted  COGNITION: Overall cognitive status: Within functional limits for tasks assessed     SENSATION: WFL  POSTURE: Rounded shoulders, fwd head  UPPER EXTREMITY ROM:   Active ROM Right eval Left eval  Shoulder flexion WFL 95  Shoulder extension    Shoulder abduction WFL 70  Shoulder adduction    Shoulder internal rotation WFL 30  Shoulder external rotation WFL 15  Elbow flexion    Elbow extension    Wrist flexion    Wrist extension    Wrist ulnar deviation    Wrist radial deviation    Wrist pronation    Wrist supination    (Blank rows = not tested)  UPPER EXTREMITY MMT:  MMT Right eval Left eval  Shoulder flexion    Shoulder extension    Shoulder abduction    Shoulder adduction    Shoulder internal rotation    Shoulder external rotation    Middle trapezius    Lower trapezius    Elbow flexion    Elbow extension    Wrist flexion    Wrist extension    Wrist ulnar deviation    Wrist radial deviation    Wrist pronation    Wrist supination    Grip strength (lbs)    (Blank rows = not tested)  SHOULDER SPECIAL TESTS: Impingement tests: DNT SLAP lesions: DNT Instability tests: DNT Rotator cuff assessment: DNT Biceps assessment: DNT  JOINT MOBILITY TESTING:  Decreasd mobility in capsular pattern  PALPATION:  TTP to L deltoid, R infraspinatus    TREATMENT: [facility] Adult PT Treatment:                                                DATE: 03/01/23 Therapeutic Exercise: UBE lvl 2.0 x 3'/3' fwd/bwd while taking subjective Row 2x12 23# Shoulder extension 20# 2x10 Supine dow flex 2x15 2# S/L L shoulder abd 2x10 2# Supine horizontal abd 3x10 blue band Serratus lift x 10 YTB - seated L shoulder wall slide 2x10 flex/abd (abd difficult) Pulleys L shoulder flex/scap x 2 min each Manual Therapy: PROM into L shoulder flex/abd/IR/ER to tolerance   [facility] Adult PT Treatment:                                                DATE:  02/24/23 Therapeutic Exercise: UBE lvl 2.0 x 3'/3' fwd/bwd while taking subjective Lifting 3# weight into cabinet flexion x10 bottom/middle shelf - cues to decrease compensation of shoulder elevation and  right trunk lean Row 3x12 20# Shoulder extension 17# 2x10 Pball roll up wall x 10 Supine horizontal abd 3x10 blue band Supine D2 shoulder flexion 2x10 GTB Seated double ER GTB 2x10 (difficult on L) Sidelying trio: ER, flexion, abduction 500g ball x15   [facility] Adult PT Treatment:                                                DATE: 02/22/23 Therapeutic Exercise: UBE lvl 2.0 x 3'/3' fwd/bwd while taking subjective Lifting 2# weight into cabinet flexion 2x10 bottom/middle shelf - cues to decrease compensation of shoulder elevation Row 3x12 20# Pball roll up wall x 10 Supine horizontal abd 3x10 blue band Supine D2 shoulder flexion 2x10 GTB L shoulder flexion wall slide 2x10 Seated double ER GTB 2x10 (difficult on L) Manual Therapy: PROM into L shoulder flex/abd/IR/ER  [facility] Adult PT Treatment:                                                DATE: 02/17/23 Therapeutic Exercise: UBE lvl 1.0 x 2'/2' fwd/bwd while taking subjective Pulleys shoulder flexion x 2 min Lifting 2# weight into cabinet flexion 2x10 bottom/middle shelf - cues to decrease compensation of shoulder elevation Row 3x12 20# Pball roll up wall x 10 Supine pball flexion 2x15 Supine horizontal abd 3x10 blue band Supine D2 shoulder flexion 2x10 GTB L shoiulder flexion wall slide 2x10 L shoulder ER 2x10 GTB (difficult) Manual Therapy PROM into L shoulder flex/abd/IR/ER    PATIENT EDUCATION: Education details: continue HEP Person educated: Patient Education method: Explanation, Demonstration, and Handouts Education comprehension: verbalized understanding and returned demonstration  HOME EXERCISE PROGRAM: Access Code: [email or web address] URL: [email or web address] Date: 01/26/2023 Prepared by: Edwinna Areola  Exercises - Supine Shoulder Flexion Extension AAROM with Dowel  - 1 x daily - 7 x weekly - 2 sets - 10 reps - 5 sec hold - Supine Shoulder Horizontal Abduction with Resistance  - 1 x daily - 7 x weekly - 3 sets - 10 reps - green band hold - Seated Shoulder External Rotation AAROM with Dowel  - 1 x daily - 7 x weekly - 2 sets - 10 reps - 5 sec hold - Shoulder Flexion Wall Slide with Towel  - 1 x daily - 7 x weekly - 2 sets - 10 reps - Standing Shoulder Abduction Slides at Wall  - 1 x daily - 7 x weekly - 2 sets - 10 reps - Standing Shoulder Row with Anchored Resistance  - 1 x daily - 7 x weekly - 3 sets - 10 reps - green band hold  ASSESSMENT:  CLINICAL IMPRESSION: Pt was able to complete all prescribed exercises with no adverse effect. Therapy focused on improving L shoulder strength and ROM. Is progressing as expected, will continue per POC.   EVAL- Patient is a 67 y.o. M who was seen today for physical therapy evaluation and treatment for L shoulder pain and stiffness. Physical findings are consistent with physician impression as pt has significant decrease in L shoulder ROM in capsular pattern. His FOTO score shows significant decrease in subjective functional ability below PLOF. Pt would benefit from skilled PT services, will continue per POC as prescribed and progress as  able.   OBJECTIVE IMPAIRMENTS: decreased activity tolerance, decreased endurance, decreased mobility, decreased ROM, decreased strength, impaired UE functional use, and pain.   ACTIVITY LIMITATIONS: carrying, lifting, bathing, dressing, self feeding, reach over head, hygiene/grooming, and caring for others  PARTICIPATION LIMITATIONS: meal prep, cleaning, driving, shopping, community activity, occupation, and yard work  PERSONAL FACTORS: Fitness and 3+ comorbidities: Heart transplant, DM II, HTN  are also affecting patient's functional outcome.    GOALS: Goals reviewed with patient? No  SHORT TERM GOALS:  Target date: 02/16/2023   Pt will be compliant and knowledgeable with initial HEP for improved comfort and carryover Baseline: initial HEP given  Goal status: MET Pt reports adherence 02/15/23  2.  Pt will self report left shoulder pain no greater than 6/10 for improved comfort and functional ability Baseline: 10/10 at worst Goal status: Ongoing Pt reports 8/10 at worst   LONG TERM GOALS: Target date: 03/23/2023   Pt will improve FOTO function score to no less than 65% as proxy for functional improvement with home ADLs and community activities  Baseline: 53% function Goal status: INITIAL   2.  Pt will self report left shoulder pain no greater than 3/10 for improved comfort and functional ability Baseline: 10/10 at worst Goal status: INITIAL   3.  Pt will improve L shoulder flex/abd AROM to no less than 125 degrees for improved functional ability for overhead reaching into cabinets  Baseline: see ROM chart Goal status: INITIAL  4.  Pt will improve L shoulder strength to at least 4/5 for all tested motions for improved stability with overhead activities  Baseline: see MMT chart Goal status: INITIAL  PLAN:  PT FREQUENCY: 1-2x/week  PT DURATION: 8 weeks  PLANNED INTERVENTIONS: Therapeutic exercises, Therapeutic activity, Neuromuscular re-education, Balance training, Gait training, Patient/Family education, Self Care, Joint mobilization, Dry Needling, Electrical stimulation, Cryotherapy, Moist heat, Vasopneumatic device, and Re-evaluation  PLAN FOR NEXT SESSION: assess HEP response, strength and AAROM, PROM to L shoulder  Referring diagnosis?  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 (if different than referring diagnosis)  Stiffness of left shoulder, not elsewhere classified  Muscle  weakness (generalized) Localized edema  What was this (referring dx) caused by? []  Surgery []  Fall [x]  Ongoing issue []  Arthritis []  Other: ____________  Laterality: []  Rt [x]  Lt []  Both  Check all possible CPT codes:  *CHOOSE 10 OR LESS*    [x]  97110 (Therapeutic Exercise)  []  92507 (SLP Treatment)  [x]  97112 (Neuro Re-ed)   []  92526 (Swallowing Treatment)   [x]  97116 (Gait Training)   []  [id number] (Cognitive Training, 1st 15 minutes) [x]  97140 (Manual Therapy)   []  97130 (Cognitive Training, each add'l 15 minutes)  [x]  82956 (Re-evaluation)                              []  Other, List CPT Code ____________  [x]  97530 (Therapeutic Activities)     [x]  97535 (Self Care)   []  All codes above (97110 - 97535)  []  97012 (Mechanical Traction)  [x]  97014 (E-stim Unattended)  [x]  97032 (E-stim manual)  []  97033 (Ionto)  []  97035 (Ultrasound) []  97750 (Physical Performance Training) []  [id number] (Aquatic Therapy) [x]  97016 (Vasopneumatic Device) []  [id number] (Paraffin) []  97034 (Contrast Bath) []  97597 (Wound Care 1st 20 sq cm) []  97598 (Wound Care each  add'l 20 sq cm) []  97760 (Orthotic Fabrication, Fitting, Training Initial) []  40981 (Prosthetic Management and Training Initial) []  [phone] (Orthotic or Prosthetic Training/ Modification Subsequent)   Eloy End, PT 03/01/2023, 1:11 PM

## 2023-03-01 NOTE — Unmapped (Signed)
Discussed recent labs with [facility], [facility].  Plan is to Make No Changes  with repeat labs in 3 Months.    Sent patient MyChart message with lab results and follow up plan      Lab Results   Component Value Date    TACROLIMUS 5.1 02/25/2023     Goal: Tac: 5-8  Current Dose: Tacrolimus 1 mg/0.5 mg    Lab Results   Component Value Date    BUN 27 (H) 02/25/2023    CREATININE 1.44 (H) 02/25/2023    K 4.8 02/25/2023    GLU 132 (H) 02/25/2023    MG 1.6 02/25/2023     Lab Results   Component Value Date    WBC 8.7 02/25/2023    HGB 12.0 (L) 02/25/2023    HCT 37.1 (L) 02/25/2023    PLT 279 02/25/2023    NEUTROABS 7,047 02/25/2023    EOSABS 52 02/25/2023

## 2023-03-03 ENCOUNTER — Ambulatory Visit: Payer: Medicare HMO

## 2023-03-03 DIAGNOSIS — M6281 Muscle weakness (generalized): Secondary | ICD-10-CM | POA: Diagnosis not present

## 2023-03-03 DIAGNOSIS — M25612 Stiffness of left shoulder, not elsewhere classified: Secondary | ICD-10-CM

## 2023-03-03 DIAGNOSIS — R600 Localized edema: Secondary | ICD-10-CM | POA: Diagnosis not present

## 2023-03-03 NOTE — Therapy (Signed)
OUTPATIENT PHYSICAL THERAPY TREATMENT NOTE   Patient Name: [name] MRN: [id number] DOB:1956-07-03, 67 y.o., male Today's Date: 03/03/2023  END OF SESSION:  PT End of Session - 03/03/23 1742     Visit Number 8    Number of Visits 17    Date for PT Re-Evaluation 03/23/23    Authorization Type [employer] MCR    PT Start Time 1743    PT Stop Time 1821    PT Time Calculation (min) 38 min    Activity Tolerance Patient tolerated treatment well    Behavior During Therapy WFL for tasks assessed/performed               Past Medical History:  Diagnosis Date   Abdominal pain, epigastric 03/31/2009   Acute on chronic systolic heart failure (HCC) 03/02/2009   ANEMIA, OTHER UNSPEC 04/03/2008   AUTOMATIC IMPLANTABLE CARDIAC DEFIBRILLATOR SITU 02/26/2009   on old heart   Blood transfusion without reported diagnosis    CHF (congestive heart failure) (HCC)    CORONARY ARTERY DISEASE 04/25/2007   DIABETES MELLITUS, TYPE II 12/13/2008   no meds, diet contolled   GERD (gastroesophageal reflux disease)    HYPERLIPIDEMIA 04/25/2007   Hypertension    HYPERTENSION, HX OF 05/14/2008   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   MYOCARDIAL INFARCTION, 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   OBESITY 02/26/2009   Thyroid disease    THYROID DISEASE, HX OF 05/14/2008   VENTRICULAR TACHYCARDIA 02/26/2009   Past Surgical History:  Procedure Laterality Date   ANGIOPLASTY     with stent   CARDIAC ASSIST DEVICE REMOVAL     CARDIAC CATHETERIZATION     COLONOSCOPY     CORONARY ARTERY BYPASS GRAFT     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   LEFT VENTRICULAR ASSIST DEVICE  05/2009   [facility]   Patient Active Problem List   Diagnosis Date Noted   Immunosuppressed status (HCC) 02/23/2019   Chronic abdominal pain 12/27/2015   Heart transplanted (HCC) 06/19/2014   GERD (gastroesophageal reflux disease) 06/19/2014   Osteoarthritis 06/19/2014   BPH (benign prostatic hypertrophy) 02/26/2014   History of adenomatous polyp of colon  09/17/2013   Chronic systolic heart failure (HCC) 03/02/2009   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   AUTOMATIC IMPLANTABLE CARDIAC DEFIBRILLATOR SITU 02/26/2009   Diabetes mellitus type II, controlled (HCC) 12/13/2008   Hypothyroidism 05/14/2008   Essential hypertension 05/14/2008   ANEMIA, OTHER UNSPEC 04/03/2008   Hyperlipidemia associated with type 2 diabetes mellitus (HCC) 04/25/2007   Coronary atherosclerosis 04/25/2007    PCP: Shelva Majestic, MD  REFERRING PROVIDER: Richardean Sale, DO  REFERRING DIAG:  [phone] (ICD-10-CM) - Chronic left shoulder pain M19.012 (ICD-10-CM) - Primary osteoarthritis of left shoulder E11.618,M75.00 (ICD-10-CM) - 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 Pt feels this was due to increased intensity of last therapy session.   Hand dominance: Right  PERTINENT HISTORY: Heart transplant, DM II, HTN  PAIN:  Are you having pain?  No: NPRS scale: 0/10 Worst: 8/10 Pain location: L shoulder  Pain description: sharp, tight Aggravating factors: overhead reaching, cross arm Relieving factors: rest, heat  PRECAUTIONS: None  WEIGHT BEARING RESTRICTIONS: No  FALLS:  Has patient fallen in last 6 months? No  LIVING ENVIRONMENT: Lives with: lives with their family Lives in: House/apartment  OCCUPATION: Retired  PLOF: Independent  PATIENT GOALS: Improve L shoulder mobility with overhead reaching into cabinets  OBJECTIVE:   DIAGNOSTIC  FINDINGS:  N/A  PATIENT SURVEYS:  FOTO: 53% function; 65% predicted  COGNITION: Overall cognitive status: Within functional limits for tasks assessed     SENSATION: WFL  POSTURE: Rounded shoulders, fwd head  UPPER EXTREMITY ROM:   Active ROM Right eval Left eval  Shoulder flexion WFL 95  Shoulder extension    Shoulder abduction WFL 70  Shoulder adduction    Shoulder internal rotation WFL 30  Shoulder external rotation WFL 15  Elbow flexion    Elbow extension    Wrist flexion    Wrist extension    Wrist ulnar deviation    Wrist radial deviation    Wrist pronation    Wrist supination    (Blank rows = not tested)  UPPER EXTREMITY MMT:  MMT Right eval Left eval  Shoulder flexion    Shoulder extension    Shoulder abduction    Shoulder adduction    Shoulder internal rotation    Shoulder external rotation    Middle trapezius    Lower trapezius    Elbow flexion    Elbow extension    Wrist flexion    Wrist extension    Wrist ulnar deviation    Wrist radial deviation    Wrist pronation    Wrist supination    Grip strength (lbs)    (Blank rows = not tested)  SHOULDER SPECIAL TESTS: Impingement tests: DNT SLAP lesions: DNT Instability tests: DNT Rotator cuff assessment: DNT Biceps assessment: DNT  JOINT MOBILITY TESTING:  Decreasd mobility in capsular pattern  PALPATION:  TTP to L deltoid, R infraspinatus    TREATMENT: [facility] Adult PT Treatment:                                                DATE: 03/03/23 Therapeutic Exercise: UBE lvl 2.0 x 2'/2' fwd/bwd while taking subjective Row 2x12 20# Shoulder extension 17# 2x10 Supine dow flex 2x15 2# Supine dow 2x10 5# S/L L shoulder abd 2x10 2# Supine horizontal abd 3x10 blue band Standing lat pulldown 2x10 20# Wall walk x 10 L shoulder flex L UE ranger shoulder flex x 10 34' L shoulder wall slide 2x10 flex/abd (abd difficult) Pulleys L shoulder flex/scap x 2 min each  [facility] Adult PT Treatment:                                                 DATE: 03/01/23 Therapeutic Exercise: UBE lvl 2.0 x 3'/3' fwd/bwd while taking subjective Row 2x12 23# Shoulder extension  20# 2x10 Supine dow flex 2x15 2# S/L L shoulder abd 2x10 2# Supine horizontal abd 3x10 blue band Serratus lift x 10 YTB - seated L shoulder wall slide 2x10 flex/abd (abd difficult) Pulleys L shoulder flex/scap x 2 min each Manual Therapy: PROM into L shoulder flex/abd/IR/ER to tolerance   [facility] Adult PT Treatment:                                                DATE: 02/24/23 Therapeutic Exercise: UBE lvl 2.0 x 3'/3' fwd/bwd while taking subjective Lifting 3# weight into cabinet flexion x10 bottom/middle shelf - cues to decrease compensation of shoulder elevation and right trunk lean Row 3x12 20# Shoulder extension 17# 2x10 Pball roll up wall x 10 Supine horizontal abd 3x10 blue band Supine D2 shoulder flexion 2x10 GTB Seated double ER GTB 2x10 (difficult on L) Sidelying trio: ER, flexion, abduction 500g ball x15   [facility] Adult PT Treatment:                                                DATE: 02/22/23 Therapeutic Exercise: UBE lvl 2.0 x 3'/3' fwd/bwd while taking subjective Lifting 2# weight into cabinet flexion 2x10 bottom/middle shelf - cues to decrease compensation of shoulder elevation Row 3x12 20# Pball roll up wall x 10 Supine horizontal abd 3x10 blue band Supine D2 shoulder flexion 2x10 GTB L shoulder flexion wall slide 2x10 Seated double ER GTB 2x10 (difficult on L) Manual Therapy: PROM into L shoulder flex/abd/IR/ER  [facility] Adult PT Treatment:                                                DATE: 02/17/23 Therapeutic Exercise: UBE lvl 1.0 x 2'/2' fwd/bwd while taking subjective Pulleys shoulder flexion x 2 min Lifting 2# weight into cabinet flexion 2x10 bottom/middle shelf - cues to decrease compensation of shoulder elevation Row 3x12 20# Pball roll up wall x 10 Supine pball flexion 2x15 Supine horizontal  abd 3x10 blue band Supine D2 shoulder flexion 2x10 GTB L shoiulder flexion wall slide 2x10 L shoulder ER 2x10 GTB (difficult) Manual Therapy PROM into L shoulder flex/abd/IR/ER    PATIENT EDUCATION: Education details: continue HEP Person educated: Patient Education method: Explanation, Demonstration, and Handouts Education comprehension: verbalized understanding and returned demonstration  HOME EXERCISE PROGRAM: Access Code: [email or web address] URL: [email or web address] Date: 03/03/2023 Prepared by: Edwinna Areola  Exercises - Standing Ulnar Nerve Glide  - 1 x daily - 7 x weekly - 3 sets - 10 reps - Standing Shoulder Row with Anchored Resistance  - 1 x daily - 7 x weekly - 3 sets - 10 reps - blue band hold - Seated Upper Trapezius Stretch  - 1 x daily - 7 x weekly - 2 reps - 30 sec hold - Supine Quadricep Sets  - 1 x daily - 7 x weekly - 2 sets - 10 reps - 5 sec hold - Active Straight Leg Raise with Quad Set  - 1 x daily - 7 x weekly - 2 sets - 10 reps - Seated Hip Abduction with Resistance  -  1 x daily - 7 x weekly - 3 sets - 15 reps - Seated Wrist Flexion Stretch  - 1 x daily - 7 x weekly - 2 reps - 30 sec hold  ASSESSMENT:  CLINICAL IMPRESSION: Pt was able to complete all prescribed exercises with no adverse effect. Therapy focused on improving L shoulder strength and ROM. Is progressing as expected, will continue per POC.   EVAL- Patient is a 67 y.o. M who was seen today for physical therapy evaluation and treatment for L shoulder pain and stiffness. Physical findings are consistent with physician impression as pt has significant decrease in L shoulder ROM in capsular pattern. His FOTO score shows significant decrease in subjective functional ability below PLOF. Pt would benefit from skilled PT services, will continue per POC as prescribed and progress as able.   OBJECTIVE IMPAIRMENTS: decreased activity tolerance, decreased endurance, decreased mobility, decreased ROM,  decreased strength, impaired UE functional use, and pain.   ACTIVITY LIMITATIONS: carrying, lifting, bathing, dressing, self feeding, reach over head, hygiene/grooming, and caring for others  PARTICIPATION LIMITATIONS: meal prep, cleaning, driving, shopping, community activity, occupation, and yard work  PERSONAL FACTORS: Fitness and 3+ comorbidities: Heart transplant, DM II, HTN  are also affecting patient's functional outcome.    GOALS: Goals reviewed with patient? No  SHORT TERM GOALS: Target date: 02/16/2023   Pt will be compliant and knowledgeable with initial HEP for improved comfort and carryover Baseline: initial HEP given  Goal status: MET Pt reports adherence 02/15/23  2.  Pt will self report left shoulder pain no greater than 6/10 for improved comfort and functional ability Baseline: 10/10 at worst Goal status: Ongoing Pt reports 8/10 at worst   LONG TERM GOALS: Target date: 03/23/2023   Pt will improve FOTO function score to no less than 65% as proxy for functional improvement with home ADLs and community activities  Baseline: 53% function Goal status: INITIAL   2.  Pt will self report left shoulder pain no greater than 3/10 for improved comfort and functional ability Baseline: 10/10 at worst Goal status: INITIAL   3.  Pt will improve L shoulder flex/abd AROM to no less than 125 degrees for improved functional ability for overhead reaching into cabinets  Baseline: see ROM chart Goal status: INITIAL  4.  Pt will improve L shoulder strength to at least 4/5 for all tested motions for improved stability with overhead activities  Baseline: see MMT chart Goal status: INITIAL  PLAN:  PT FREQUENCY: 1-2x/week  PT DURATION: 8 weeks  PLANNED INTERVENTIONS: Therapeutic exercises, Therapeutic activity, Neuromuscular re-education, Balance training, Gait training, Patient/Family education, Self Care, Joint mobilization, Dry Needling, Electrical stimulation, Cryotherapy,  Moist heat, Vasopneumatic device, and Re-evaluation  PLAN FOR NEXT SESSION: assess HEP response, strength and AAROM, PROM to L shoulder  Referring diagnosis?  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 (if different than referring diagnosis)  Stiffness of left shoulder, not elsewhere classified  Muscle weakness (generalized) Localized edema  What was this (referring dx) caused by? []  Surgery []  Fall [x]  Ongoing issue []  Arthritis []  Other: ____________  Laterality: []  Rt [x]  Lt []  Both  Check all possible CPT codes:  *CHOOSE 10 OR LESS*    [x]  16109 (Therapeutic Exercise)  []  92507 (SLP Treatment)  [x]  60454 (Neuro Re-ed)   []  92526 (Swallowing Treatment)   [x]  09811 (Gait Training)   []   29562 (Cognitive Training, 1st 15 minutes) [x]  97140 (Manual Therapy)   []  97130 (Cognitive Training, each add'l 15 minutes)  [x]  97164 (Re-evaluation)                              []  Other, List CPT Code ____________  [x]  13086 (Therapeutic Activities)     [x]  97535 (Self Care)   []  All codes above (97110 - 97535)  []  97012 (Mechanical Traction)  [x]  97014 (E-stim Unattended)  [x]  97032 (E-stim manual)  []  97033 (Ionto)  []  97035 (Ultrasound) []  97750 (Physical Performance Training) []  [id number] (Aquatic Therapy) [x]  97016 (Vasopneumatic Device) []  [id number] (Paraffin) []  97034 (Contrast Bath) []  97597 (Wound Care 1st 20 sq cm) []  97598 (Wound Care each add'l 20 sq cm) []  97760 (Orthotic Fabrication, Fitting, Training Initial) []  [id number] (Prosthetic Management and Training Initial) []  [id number] (Orthotic or Prosthetic Training/ Modification Subsequent)   Eloy End, PT 03/03/2023, 6:25 PM

## 2023-03-09 ENCOUNTER — Ambulatory Visit: Payer: Medicare HMO | Attending: Sports Medicine

## 2023-03-09 DIAGNOSIS — M25612 Stiffness of left shoulder, not elsewhere classified: Secondary | ICD-10-CM | POA: Insufficient documentation

## 2023-03-09 DIAGNOSIS — M6281 Muscle weakness (generalized): Secondary | ICD-10-CM | POA: Diagnosis not present

## 2023-03-09 NOTE — Therapy (Signed)
OUTPATIENT PHYSICAL THERAPY TREATMENT NOTE   Patient Name: [name] MRN: [id number] DOB:07-18-1956, 67 y.o., male Today's Date: 03/09/2023  END OF SESSION:  PT End of Session - 03/09/23 1650     Visit Number 9    Number of Visits 17    Date for PT Re-Evaluation 03/23/23    Authorization Type [employer] MCR    PT Start Time 1700    PT Stop Time 1738    PT Time Calculation (min) 38 min    Activity Tolerance Patient tolerated treatment well    Behavior During Therapy WFL for tasks assessed/performed                Past Medical History:  Diagnosis Date   Abdominal pain, epigastric 03/31/2009   Acute on chronic systolic heart failure (HCC) 03/02/2009   ANEMIA, OTHER UNSPEC 04/03/2008   AUTOMATIC IMPLANTABLE CARDIAC DEFIBRILLATOR SITU 02/26/2009   on old heart   Blood transfusion without reported diagnosis    CHF (congestive heart failure) (HCC)    CORONARY ARTERY DISEASE 04/25/2007   DIABETES MELLITUS, TYPE II 12/13/2008   no meds, diet contolled   GERD (gastroesophageal reflux disease)    HYPERLIPIDEMIA 04/25/2007   Hypertension    HYPERTENSION, HX OF 05/14/2008   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   MYOCARDIAL INFARCTION, 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   OBESITY 02/26/2009   Thyroid disease    THYROID DISEASE, HX OF 05/14/2008   VENTRICULAR TACHYCARDIA 02/26/2009   Past Surgical History:  Procedure Laterality Date   ANGIOPLASTY     with stent   CARDIAC ASSIST DEVICE REMOVAL     CARDIAC CATHETERIZATION     COLONOSCOPY     CORONARY ARTERY BYPASS GRAFT     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   LEFT VENTRICULAR ASSIST DEVICE  05/2009   [facility]   Patient Active Problem List   Diagnosis Date Noted   Immunosuppressed status (HCC) 02/23/2019   Chronic abdominal pain 12/27/2015   Heart transplanted (HCC) 06/19/2014   GERD (gastroesophageal reflux disease) 06/19/2014   Osteoarthritis 06/19/2014   BPH (benign prostatic hypertrophy) 02/26/2014   History of adenomatous polyp of colon  09/17/2013   Chronic systolic heart failure (HCC) 03/02/2009   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   AUTOMATIC IMPLANTABLE CARDIAC DEFIBRILLATOR SITU 02/26/2009   Diabetes mellitus type II, controlled (HCC) 12/13/2008   Hypothyroidism 05/14/2008   Essential hypertension 05/14/2008   ANEMIA, OTHER UNSPEC 04/03/2008   Hyperlipidemia associated with type 2 diabetes mellitus (HCC) 04/25/2007   Coronary atherosclerosis 04/25/2007    PCP: Shelva Majestic, MD  REFERRING PROVIDER: Richardean Sale, DO  REFERRING DIAG:  [phone] (ICD-10-CM) - Chronic left shoulder pain M19.012 (ICD-10-CM) - Primary osteoarthritis of left shoulder E11.618,M75.00 (ICD-10-CM) - 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 Has been compliant with HEP.   Hand dominance: Right  PERTINENT HISTORY: Heart transplant, DM II, HTN  PAIN:  Are you having pain?  No: NPRS scale: 0/10 Worst: 8/10 Pain location: L shoulder  Pain description: sharp, tight Aggravating factors: overhead reaching, cross arm Relieving factors: rest, heat  PRECAUTIONS: None  WEIGHT BEARING RESTRICTIONS: No  FALLS:  Has patient fallen in last 6 months? No  LIVING ENVIRONMENT: Lives with: lives with their family Lives in: House/apartment  OCCUPATION: Retired  PLOF: Independent  PATIENT GOALS: Improve L shoulder mobility with overhead reaching into cabinets  OBJECTIVE:   DIAGNOSTIC FINDINGS:  N/A  PATIENT SURVEYS:  FOTO: 53% function;  65% predicted  COGNITION: Overall cognitive status: Within functional limits for tasks assessed     SENSATION: WFL  POSTURE: Rounded shoulders, fwd head  UPPER EXTREMITY ROM:   Active ROM Right eval Left eval  Shoulder flexion WFL 95  Shoulder extension    Shoulder abduction WFL 70  Shoulder adduction    Shoulder internal rotation WFL 30  Shoulder external rotation WFL 15  Elbow flexion    Elbow extension    Wrist flexion    Wrist extension    Wrist ulnar deviation    Wrist radial deviation    Wrist pronation    Wrist supination    (Blank rows = not tested)  UPPER EXTREMITY MMT:  MMT Right eval Left eval  Shoulder flexion    Shoulder extension    Shoulder abduction    Shoulder adduction    Shoulder internal rotation    Shoulder external rotation    Middle trapezius    Lower trapezius    Elbow flexion    Elbow extension    Wrist flexion    Wrist extension    Wrist ulnar deviation    Wrist radial deviation    Wrist pronation    Wrist supination    Grip strength (lbs)    (Blank rows = not tested)  SHOULDER SPECIAL TESTS: Impingement tests: DNT SLAP lesions: DNT Instability tests: DNT Rotator cuff assessment: DNT Biceps assessment: DNT  JOINT MOBILITY TESTING:  Decreasd mobility in capsular pattern  PALPATION:  TTP to L deltoid, R infraspinatus    TREATMENT: [facility] Adult PT Treatment:                                                DATE: 03/09/23 Therapeutic Exercise: UBE lvl 2.0 x 3'/3' fwd/bwd while taking subjective Pulleys L shoulder flex/scap x 2 min each Row 3x10 17# Shoulder extension 20# 2x10 Supine dow flex 2x10 5# Supine dow chest press 2x15 10# Supine horizontal abd 2x10 blue band Standing lat pulldown 2x10 20# L UE ranger shoulder flex x 10 34' L shoulder wall slide 2x10 flex/abd (abd difficult)  [facility] Adult PT Treatment:                                                DATE: 03/03/23 Therapeutic Exercise: UBE lvl 2.0 x 2'/2'  fwd/bwd while taking subjective Row 2x12 20# Shoulder extension 17# 2x10 Supine dow flex 2x15 2# Supine dow 2x10 5# S/L L shoulder abd 2x10  2# Supine horizontal abd 3x10 blue band Standing lat pulldown 2x10 20# Wall walk x 10 L shoulder flex L UE ranger shoulder flex x 10 34' L shoulder wall slide 2x10 flex/abd (abd difficult) Pulleys L shoulder flex/scap x 2 min each  [facility] Adult PT Treatment:                                                DATE: 03/01/23 Therapeutic Exercise: UBE lvl 2.0 x 3'/3' fwd/bwd while taking subjective Row 2x12 23# Shoulder extension 20# 2x10 Supine dow flex 2x15 2# S/L L shoulder abd 2x10 2# Supine horizontal abd 3x10 blue band Serratus lift x 10 YTB - seated L shoulder wall slide 2x10 flex/abd (abd difficult) Pulleys L shoulder flex/scap x 2 min each Manual Therapy: PROM into L shoulder flex/abd/IR/ER to tolerance   [facility] Adult PT Treatment:                                                DATE: 02/24/23 Therapeutic Exercise: UBE lvl 2.0 x 3'/3' fwd/bwd while taking subjective Lifting 3# weight into cabinet flexion x10 bottom/middle shelf - cues to decrease compensation of shoulder elevation and right trunk lean Row 3x12 20# Shoulder extension 17# 2x10 Pball roll up wall x 10 Supine horizontal abd 3x10 blue band Supine D2 shoulder flexion 2x10 GTB Seated double ER GTB 2x10 (difficult on L) Sidelying trio: ER, flexion, abduction 500g ball x15   [facility] Adult PT Treatment:                                                DATE: 02/22/23 Therapeutic Exercise: UBE lvl 2.0 x 3'/3' fwd/bwd while taking subjective Lifting 2# weight into cabinet flexion 2x10 bottom/middle shelf - cues to decrease compensation of shoulder elevation Row 3x12 20# Pball roll up wall x 10 Supine horizontal abd 3x10 blue band Supine D2 shoulder flexion 2x10 GTB L shoulder flexion wall slide 2x10 Seated double ER GTB 2x10 (difficult on L) Manual Therapy: PROM into L shoulder  flex/abd/IR/ER  [facility] Adult PT Treatment:                                                DATE: 02/17/23 Therapeutic Exercise: UBE lvl 1.0 x 2'/2' fwd/bwd while taking subjective Pulleys shoulder flexion x 2 min Lifting 2# weight into cabinet flexion 2x10 bottom/middle shelf - cues to decrease compensation of shoulder elevation Row 3x12 20# Pball roll up wall x 10 Supine pball flexion 2x15 Supine horizontal abd 3x10 blue band Supine D2 shoulder flexion 2x10 GTB L shoiulder flexion wall slide 2x10 L shoulder ER 2x10 GTB (difficult) Manual Therapy PROM into L shoulder flex/abd/IR/ER    PATIENT EDUCATION: Education details: continue HEP Person educated: Patient Education method: Explanation, Demonstration, and Handouts Education comprehension: verbalized understanding and returned demonstration  HOME EXERCISE PROGRAM: Access Code: [email or web address] URL: [email or web address] Date: 03/03/2023 Prepared by: Edwinna Areola  Exercises - Standing Ulnar Nerve Glide  - 1  x daily - 7 x weekly - 3 sets - 10 reps - Standing Shoulder Row with Anchored Resistance  - 1 x daily - 7 x weekly - 3 sets - 10 reps - blue band hold - Seated Upper Trapezius Stretch  - 1 x daily - 7 x weekly - 2 reps - 30 sec hold - Supine Quadricep Sets  - 1 x daily - 7 x weekly - 2 sets - 10 reps - 5 sec hold - Active Straight Leg Raise with Quad Set  - 1 x daily - 7 x weekly - 2 sets - 10 reps - Seated Hip Abduction with Resistance  - 1 x daily - 7 x weekly - 3 sets - 15 reps - Seated Wrist Flexion Stretch  - 1 x daily - 7 x weekly - 2 reps - 30 sec hold  ASSESSMENT:  CLINICAL IMPRESSION: Pt was able to complete all prescribed exercises with no adverse effect. Therapy focused on improving L shoulder strength and ROM. Is progressing as expected, will continue per POC.   EVAL- Patient is a 67 y.o. M who was seen today for physical therapy evaluation and treatment for L shoulder pain and stiffness. Physical  findings are consistent with physician impression as pt has significant decrease in L shoulder ROM in capsular pattern. His FOTO score shows significant decrease in subjective functional ability below PLOF. Pt would benefit from skilled PT services, will continue per POC as prescribed and progress as able.   OBJECTIVE IMPAIRMENTS: decreased activity tolerance, decreased endurance, decreased mobility, decreased ROM, decreased strength, impaired UE functional use, and pain.   ACTIVITY LIMITATIONS: carrying, lifting, bathing, dressing, self feeding, reach over head, hygiene/grooming, and caring for others  PARTICIPATION LIMITATIONS: meal prep, cleaning, driving, shopping, community activity, occupation, and yard work  PERSONAL FACTORS: Fitness and 3+ comorbidities: Heart transplant, DM II, HTN  are also affecting patient's functional outcome.    GOALS: Goals reviewed with patient? No  SHORT TERM GOALS: Target date: 02/16/2023   Pt will be compliant and knowledgeable with initial HEP for improved comfort and carryover Baseline: initial HEP given  Goal status: MET Pt reports adherence 02/15/23  2.  Pt will self report left shoulder pain no greater than 6/10 for improved comfort and functional ability Baseline: 10/10 at worst Goal status: Ongoing Pt reports 8/10 at worst   LONG TERM GOALS: Target date: 03/23/2023   Pt will improve FOTO function score to no less than 65% as proxy for functional improvement with home ADLs and community activities  Baseline: 53% function Goal status: INITIAL   2.  Pt will self report left shoulder pain no greater than 3/10 for improved comfort and functional ability Baseline: 10/10 at worst Goal status: INITIAL   3.  Pt will improve L shoulder flex/abd AROM to no less than 125 degrees for improved functional ability for overhead reaching into cabinets  Baseline: see ROM chart Goal status: INITIAL  4.  Pt will improve L shoulder strength to at least 4/5  for all tested motions for improved stability with overhead activities  Baseline: see MMT chart Goal status: INITIAL  PLAN:  PT FREQUENCY: 1-2x/week  PT DURATION: 8 weeks  PLANNED INTERVENTIONS: Therapeutic exercises, Therapeutic activity, Neuromuscular re-education, Balance training, Gait training, Patient/Family education, Self Care, Joint mobilization, Dry Needling, Electrical stimulation, Cryotherapy, Moist heat, Vasopneumatic device, and Re-evaluation  PLAN FOR NEXT SESSION: assess HEP response, strength and AAROM, PROM to L shoulder  Referring diagnosis?  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 (if different than referring diagnosis)  Stiffness of left shoulder, not elsewhere classified  Muscle weakness (generalized) Localized edema  What was this (referring dx) caused by? []  Surgery []  Fall [x]  Ongoing issue []  Arthritis []  Other: ____________  Laterality: []  Rt [x]  Lt []  Both  Check all possible CPT codes:  *CHOOSE 10 OR LESS*    [x]  97110 (Therapeutic Exercise)  []  92507 (SLP Treatment)  [x]  97112 (Neuro Re-ed)   []  92526 (Swallowing Treatment)   [x]  97116 (Gait Training)   []  [id number] (Cognitive Training, 1st 15 minutes) [x]  97140 (Manual Therapy)   []  97130 (Cognitive Training, each add'l 15 minutes)  [x]  97164 (Re-evaluation)                              []  Other, List CPT Code ____________  [x]  97530 (Therapeutic Activities)     [x]  97535 (Self Care)   []  All codes above (97110 - 97535)  []  97012 (Mechanical Traction)  [x]  97014 (E-stim Unattended)  [x]  97032 (E-stim manual)  []  97033 (Ionto)  []  16109 (Ultrasound) []  97750 (Physical Performance Training) []  [id number] (Aquatic Therapy) [x]  97016 (Vasopneumatic Device) []  [id number] (Paraffin) []  97034 (Contrast  Bath) []  97597 (Wound Care 1st 20 sq cm) []  97598 (Wound Care each add'l 20 sq cm) []  97760 (Orthotic Fabrication, Fitting, Training Initial) []  [id number] (Prosthetic Management and Training Initial) []  [id number] (Orthotic or Prosthetic Training/ Modification Subsequent)   Eloy End, PT 03/09/2023, 5:38 PM

## 2023-03-14 DIAGNOSIS — Z941 Heart transplant status: Principal | ICD-10-CM

## 2023-03-14 MED ORDER — TACROLIMUS 0.5 MG CAPSULE, IMMEDIATE-RELEASE
ORAL_CAPSULE | ORAL | 11 refills | 30 days | Status: CP
Start: 2023-03-14 — End: ?
  Filled 2023-03-17: qty 90, 30d supply, fill #0

## 2023-03-14 NOTE — Unmapped (Signed)
[facility] Clinical Intervention    Type of intervention: Narrow therapeutic index drug    Medication involved: Tacrolimus    Problem identified: pt has been taking accord tacrolimus and we only stock sandoz    Intervention performed: Spoke to patient about changing their accord tacrolimus to [employer].  Told the patient to finish the old mfg then start the new one.  Told patient to give the clinic a call to schedule labs when they start the new sandoz brand. Pt acknowledged understanding      Follow-up needed: Pt will call clinic and set up labs when starting the new mfg.    Approximate time spent: 5-10 minutes    Clinical evidence used to support intervention: FDA Orange Book    Result of the intervention: Prevention of an adverse drug event    [name], [facility]   [facility] Specialty Pharmacist

## 2023-03-14 NOTE — Unmapped (Addendum)
Spoke to patient about changing their accord tacrolimus to Sandoz.  Told the patient to finish the old mfg then start the new one.  Told patient to give the clinic a call to schedule labs when they start the new sandoz brand.  Pt acknowledged understanding. Dcr         [facility] Refill Coordination Note    Specialty Medication(s) to be Shipped:   Transplant: mycophenolate mofetil 500 mg and tacrolimus 0.5 mg    Other medication(s) to be shipped:  levothyroxine,diltiazem,chlorthalidone,     Andrew Dickerson, DOB: [birth year]  Phone: There are no phone numbers on file.      All above HIPAA information was verified with patient.     Was a Nurse, learning disability used for this call? No    Completed refill call assessment today to schedule patient's medication shipment from the [facility] [phone]).  All relevant notes have been reviewed.     Specialty medication(s) and dose(s) confirmed: Regimen is correct and unchanged.   Changes to medications: Sosaia reports no changes at this time.  Changes to insurance: No  New side effects reported not previously addressed with a pharmacist or physician: None reported  Questions for the pharmacist: No    Confirmed patient received a Conservation officer, historic buildings and a Surveyor, mining with first shipment. The patient will receive a drug information handout for each medication shipped and additional FDA Medication Guides as required.       DISEASE/MEDICATION-SPECIFIC INFORMATION        N/A    SPECIALTY MEDICATION ADHERENCE     Medication Adherence    Patient reported X missed doses in the last month: 1  Specialty Medication: tacrolimus 0.5 MG capsule (PROGRAF)  Patient is on additional specialty medications: Yes  Additional Specialty Medications: mycophenolate 500 mg tablet (CELLCEPT)  Patient Reported Additional Medication X Missed Doses in the Last Month: 1              Were doses missed due to medication being on hold? No      mycophenolate 500 mg tablet (CELLCEPT): 12 days of medicine on hand     tacrolimus 0.5 MG capsule (PROGRAF): 12 days of medicine on hand       REFERRAL TO PHARMACIST     Referral to the pharmacist: Not needed      [facility]     Shipping address confirmed in Epic.       Delivery Scheduled: Yes, Expected medication delivery date: 03/18/2023.     Medication will be delivered via UPS to the prescription address in Epic [place].    Grady Lucci J Helane Gunther   [facility] Specialty Technician

## 2023-03-15 ENCOUNTER — Ambulatory Visit: Payer: Medicare HMO

## 2023-03-15 ENCOUNTER — Ambulatory Visit: Payer: Medicare HMO | Admitting: Sports Medicine

## 2023-03-15 DIAGNOSIS — M6281 Muscle weakness (generalized): Secondary | ICD-10-CM

## 2023-03-15 DIAGNOSIS — M25612 Stiffness of left shoulder, not elsewhere classified: Secondary | ICD-10-CM | POA: Diagnosis not present

## 2023-03-15 NOTE — Therapy (Signed)
OUTPATIENT PHYSICAL THERAPY TREATMENT NOTE/PROGRESS NOTE  Progress Note Reporting Period 01/26/2023 to 03/15/2023  See note below for Objective Data and Assessment of Progress/Goals.    Patient Name: [name] MRN: [id number] DOB:07-03-1956, 67 y.o., male Today's Date: 03/15/2023  END OF SESSION:  PT End of Session - 03/15/23 1532     Visit Number 10    Number of Visits 17    Date for PT Re-Evaluation 03/23/23    Authorization Type [employer] M[employer]    Authorization Time Period 10 visits 03/15/23-04/30/23    Authorization - Visit Number 1    Authorization - Number of Visits 10    PT Start Time 1530    PT Stop Time 1610    PT Time Calculation (min) 40 min    Activity Tolerance Patient tolerated treatment well    Behavior During Therapy WFL for tasks assessed/performed                 Past Medical History:  Diagnosis Date   Abdominal pain, epigastric 03/31/2009   Acute on chronic systolic heart failure (HCC) 03/02/2009   ANEMIA, OTHER UNSPEC 04/03/2008   AUTOMATIC IMPLANTABLE CARDIAC DEFIBRILLATOR SITU 02/26/2009   on old heart   Blood transfusion without reported diagnosis    CHF (congestive heart failure) (HCC)    CORONARY ARTERY DISEASE 04/25/2007   DIABETES MELLITUS, TYPE II 12/13/2008   no meds, diet contolled   GERD (gastroesophageal reflux disease)    HYPERLIPIDEMIA 04/25/2007   Hypertension    HYPERTENSION, HX OF 05/14/2008   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   MYOCARDIAL INFARCTION, 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   OBESITY 02/26/2009   Thyroid disease    THYROID DISEASE, HX OF 05/14/2008   VENTRICULAR TACHYCARDIA 02/26/2009   Past Surgical History:  Procedure Laterality Date   ANGIOPLASTY     with stent   CARDIAC ASSIST DEVICE REMOVAL     CARDIAC CATHETERIZATION     COLONOSCOPY     CORONARY ARTERY BYPASS GRAFT     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   LEFT VENTRICULAR ASSIST DEVICE  05/2009   [facility]   Patient Active Problem List   Diagnosis Date Noted    Immunosuppressed status (HCC) 02/23/2019   Chronic abdominal pain 12/27/2015   Heart transplanted (HCC) 06/19/2014   GERD (gastroesophageal reflux disease) 06/19/2014   Osteoarthritis 06/19/2014   BPH (benign prostatic hypertrophy) 02/26/2014   History of adenomatous polyp of colon 09/17/2013   Chronic systolic heart failure (HCC) 03/02/2009   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   AUTOMATIC IMPLANTABLE CARDIAC DEFIBRILLATOR SITU 02/26/2009   Diabetes mellitus type II, controlled (HCC) 12/13/2008   Hypothyroidism 05/14/2008   Essential hypertension 05/14/2008   ANEMIA, OTHER UNSPEC 04/03/2008   Hyperlipidemia associated with type 2 diabetes mellitus (HCC) 04/25/2007   Coronary atherosclerosis 04/25/2007    PCP: Shelva Majestic, MD  REFERRING PROVIDER: Richardean Sale, DO  REFERRING DIAG:  [phone] (ICD-10-CM) - Chronic left shoulder pain M19.012 (ICD-10-CM) - Primary osteoarthritis of left shoulder E11.618,M75.00 (ICD-10-CM) - 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  Hand dominance: Right  PERTINENT HISTORY: Heart transplant, DM II, HTN  PAIN:  Are you having pain?  No: NPRS scale: 0/10 Worst: 4/10 Pain location: L shoulder  Pain description: sharp, tight Aggravating factors: overhead reaching, cross arm Relieving factors: rest, heat  PRECAUTIONS: None  WEIGHT BEARING RESTRICTIONS: No  FALLS:  Has patient fallen in last 6 months? No  LIVING ENVIRONMENT: Lives with:  lives with their family Lives in: House/apartment  OCCUPATION: Retired  PLOF: Independent  PATIENT GOALS: Improve L shoulder mobility with overhead reaching into cabinets  OBJECTIVE:   DIAGNOSTIC FINDINGS:  N/A  PATIENT SURVEYS:  FOTO: 53% function; 65% predicted  COGNITION: Overall cognitive status: Within functional limits for tasks assessed     SENSATION: WFL  POSTURE: Rounded shoulders, fwd head  UPPER EXTREMITY ROM:   Active ROM Right eval Left eval Left 03/15/23  Shoulder flexion WFL 95 105  Shoulder extension     Shoulder abduction WFL 70 85  Shoulder adduction     Shoulder internal rotation WFL 30 35  Shoulder external rotation WFL 15 17  Elbow flexion     Elbow extension     Wrist flexion     Wrist extension     Wrist ulnar deviation     Wrist radial deviation     Wrist pronation     Wrist supination     (Blank rows = not tested)  UPPER EXTREMITY MMT:  MMT Right eval Left eval  Shoulder flexion    Shoulder extension    Shoulder abduction    Shoulder adduction    Shoulder internal rotation    Shoulder external rotation    Middle trapezius    Lower trapezius    Elbow flexion    Elbow extension    Wrist flexion    Wrist extension    Wrist ulnar deviation    Wrist radial deviation    Wrist pronation    Wrist supination    Grip strength (lbs)    (Blank rows = not tested)  SHOULDER SPECIAL TESTS: Impingement tests: DNT SLAP lesions: DNT Instability tests: DNT Rotator cuff assessment: DNT Biceps assessment: DNT  JOINT MOBILITY TESTING:  Decreasd mobility in capsular pattern  PALPATION:  TTP to L deltoid, R infraspinatus    TREATMENT: [facility] Adult PT Treatment:                                                DATE: 03/15/23 Therapeutic Exercise: UBE lvl 2.0 x 3'/3' fwd/bwd while taking subjective Row 2x10 20# Single arm row LUE 13# 2x10 Single arm shoulder extension LUE 7# 2x10 Seated double ER with scap retraction GTB  2x10 Seated horizontal abduction GTB 2x10 Standing lat pulldown 2x10 20# L UE ranger shoulder abd x 10 30' L shoulder wall slide x10 flex/abd (abd difficult) Therapeutic Activity: Re-administration and discussion of FOTO, measurements   [facility] Adult PT Treatment:                                                DATE: 03/09/23 Therapeutic Exercise: UBE lvl 2.0 x 3'/3' fwd/bwd while taking subjective Pulleys L shoulder flex/scap x 2  min each Row 3x10 17# Shoulder extension 20# 2x10 Supine dow flex 2x10 5# Supine dow chest press 2x15 10# Supine horizontal abd 2x10 blue band Standing lat pulldown 2x10 20# L UE ranger shoulder flex x 10 34' L shoulder wall slide 2x10 flex/abd (abd difficult)  [facility] Adult PT Treatment:                                                DATE: 03/03/23 Therapeutic Exercise: UBE lvl 2.0 x 2'/2' fwd/bwd while taking subjective Row 2x12 20# Shoulder extension 17# 2x10 Supine dow flex 2x15 2# Supine dow 2x10 5# S/L L shoulder abd 2x10 2# Supine horizontal abd 3x10 blue band Standing lat pulldown 2x10 20# Wall walk x 10 L shoulder flex L UE ranger shoulder flex x 10 34' L shoulder wall slide 2x10 flex/abd (abd difficult) Pulleys L shoulder flex/scap x 2 min each    PATIENT EDUCATION: Education details: continue HEP Person educated: Patient Education method: Explanation, Demonstration, and Handouts Education comprehension: verbalized understanding and returned demonstration  HOME EXERCISE PROGRAM: Access Code: [email or web address] URL: [email or web address] Date: 03/03/2023 Prepared by: Edwinna Areola  Exercises - Standing Ulnar Nerve Glide  - 1 x daily - 7 x weekly - 3 sets - 10 reps - Standing Shoulder Row with Anchored Resistance  - 1 x daily - 7 x weekly - 3 sets - 10 reps - blue band hold - Seated Upper Trapezius Stretch  - 1 x daily - 7 x weekly - 2 reps - 30 sec hold - Supine Quadricep Sets  - 1 x daily - 7 x weekly - 2 sets - 10 reps - 5 sec  hold - Active Straight Leg Raise with Quad Set  - 1 x daily - 7 x weekly - 2 sets - 10 reps - Seated Hip Abduction with Resistance  - 1 x daily - 7 x weekly - 3 sets - 15 reps - Seated Wrist Flexion Stretch  - 1 x daily - 7 x weekly - 2 reps - 30 sec hold  ASSESSMENT:  CLINICAL IMPRESSION: Patient presents to PT reporting no current pain in shoulder and states that it gets up to a 4/10 at the worst with OH movements. Re-administered FOTO and measurements this session with patient showing improvement in perceived function and all motions. Session today continued to focus on RTC and periscapular strengthening and shoulder ROM. Patient was able to tolerate all prescribed exercises with no adverse effects. Patient continues to benefit from skilled PT services and should be progressed as able to improve functional independence.   EVAL- Patient is a 67 y.o. M who was seen today for physical therapy evaluation and treatment for L shoulder pain and stiffness. Physical findings are consistent with physician impression as pt has significant decrease in L shoulder ROM in capsular pattern. His FOTO score shows significant decrease in subjective functional ability below PLOF. Pt would benefit from skilled PT services, will continue per POC as prescribed and progress as able.   OBJECTIVE IMPAIRMENTS: decreased activity tolerance, decreased endurance, decreased mobility, decreased ROM, decreased strength, impaired UE functional use, and pain.   ACTIVITY LIMITATIONS: carrying, lifting, bathing, dressing, self feeding, reach over head, hygiene/grooming, and caring for others  PARTICIPATION LIMITATIONS: meal prep, cleaning, driving, shopping, community activity, occupation, and yard work  PERSONAL FACTORS: Fitness and 3+ comorbidities: Heart transplant, DM II,  HTN  are also affecting patient's functional outcome.    GOALS: Goals reviewed with patient? No  SHORT TERM GOALS: Target date: 02/16/2023   Pt will be  compliant and knowledgeable with initial HEP for improved comfort and carryover Baseline: initial HEP given  Goal status: MET Pt reports adherence 02/15/23  2.  Pt will self report left shoulder pain no greater than 6/10 for improved comfort and functional ability Baseline: 10/10 at worst Goal status: MET Pt reports 8/10 at worst  03/15/23: 4/10 at worst  LONG TERM GOALS: Target date: 03/23/2023   Pt will improve FOTO function score to no less than 65% as proxy for functional improvement with home ADLs and community activities  Baseline: 53% function Goal status: Progressing 03/15/23: 63%   2.  Pt will self report left shoulder pain no greater than 3/10 for improved comfort and functional ability Baseline: 10/10 at worst Goal status: Progressing     3.  Pt will improve L shoulder flex/abd AROM to no less than 125 degrees for improved functional ability for overhead reaching into cabinets  Baseline: see ROM chart Goal status: Progressing  4.  Pt will improve L shoulder strength to at least 4/5 for all tested motions for improved stability with overhead activities  Baseline: see MMT chart Goal status: INITIAL  PLAN:  PT FREQUENCY: 1-2x/week  PT DURATION: 8 weeks  PLANNED INTERVENTIONS: Therapeutic exercises, Therapeutic activity, Neuromuscular re-education, Balance training, Gait training, Patient/Family education, Self Care, Joint mobilization, Dry Needling, Electrical stimulation, Cryotherapy, Moist heat, Vasopneumatic device, and Re-evaluation  PLAN FOR NEXT SESSION: assess HEP response, strength and AAROM, PROM to L shoulder  Referring diagnosis?  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 (if different than referring diagnosis)  Stiffness of left shoulder, not elsewhere classified   Muscle weakness (generalized) Localized edema  What was this (referring dx) caused by? []  Surgery []  Fall [x]  Ongoing issue []  Arthritis []  Other: ____________  Laterality: []  Rt [x]  Lt []  Both  Check all possible CPT codes:  *CHOOSE 10 OR LESS*    [x]  97110 (Therapeutic Exercise)  []  92507 (SLP Treatment)  [x]  97112 (Neuro Re-ed)   []  40981 (Swallowing Treatment)   [x]  97116 (Gait Training)   []  [id number] (Cognitive Training, 1st 15 minutes) [x]  97140 (Manual Therapy)   []  97130 (Cognitive Training, each add'l 15 minutes)  [x]  97164 (Re-evaluation)                              []  Other, List CPT Code ____________  [x]  97530 (Therapeutic Activities)     [x]  97535 (Self Care)   []  All codes above (97110 - 97535)  []  97012 (Mechanical Traction)  [x]  97014 (E-stim Unattended)  [x]  97032 (E-stim manual)  []  97033 (Ionto)  []  97035 (Ultrasound) []  97750 (Physical Performance Training) []  [id number] (Aquatic Therapy) [x]  97016 (Vasopneumatic Device) []  [id number] (Paraffin) []  97034 (Contrast Bath) []  97597 (Wound Care 1st 20 sq cm) []  97598 (Wound Care each add'l 20 sq cm) []  97760 (Orthotic Fabrication, Fitting, Training Initial) []  [id number] (Prosthetic Management and Training Initial) []  [id number] (Orthotic or Prosthetic Training/ Modification Subsequent)   Berta Minor, PTA 03/15/2023, 4:21 PM

## 2023-03-17 ENCOUNTER — Ambulatory Visit: Payer: Medicare HMO

## 2023-03-17 DIAGNOSIS — M6281 Muscle weakness (generalized): Secondary | ICD-10-CM

## 2023-03-17 DIAGNOSIS — M25612 Stiffness of left shoulder, not elsewhere classified: Secondary | ICD-10-CM

## 2023-03-17 MED FILL — CHLORTHALIDONE 25 MG TABLET: ORAL | 30 days supply | Qty: 30 | Fill #7

## 2023-03-17 MED FILL — DILTIAZEM CD 180 MG CAPSULE,EXTENDED RELEASE 24 HR: ORAL | 30 days supply | Qty: 60 | Fill #7

## 2023-03-17 MED FILL — LEVOTHYROXINE 75 MCG TABLET: ORAL | 30 days supply | Qty: 30 | Fill #7

## 2023-03-17 NOTE — Therapy (Signed)
OUTPATIENT PHYSICAL THERAPY TREATMENT NOTE   Patient Name: [name] MRN: [id number] DOB:1955/09/28, 67 y.o., male Today's Date: 03/17/2023  END OF SESSION:  PT End of Session - 03/17/23 1527     Visit Number 11    Number of Visits 17    Date for PT Re-Evaluation 03/23/23    Authorization Type [employer] M[employer]    Authorization Time Period 10 visits 03/15/23-04/30/23    Authorization - Number of Visits 10    PT Start Time 1530    PT Stop Time 1608    PT Time Calculation (min) 38 min    Activity Tolerance Patient tolerated treatment well    Behavior During Therapy WFL for tasks assessed/performed                  Past Medical History:  Diagnosis Date   Abdominal pain, epigastric 03/31/2009   Acute on chronic systolic heart failure (HCC) 03/02/2009   ANEMIA, OTHER UNSPEC 04/03/2008   AUTOMATIC IMPLANTABLE CARDIAC DEFIBRILLATOR SITU 02/26/2009   on old heart   Blood transfusion without reported diagnosis    CHF (congestive heart failure) (HCC)    CORONARY ARTERY DISEASE 04/25/2007   DIABETES MELLITUS, TYPE II 12/13/2008   no meds, diet contolled   GERD (gastroesophageal reflux disease)    HYPERLIPIDEMIA 04/25/2007   Hypertension    HYPERTENSION, HX OF 05/14/2008   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   MYOCARDIAL INFARCTION, 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   OBESITY 02/26/2009   Thyroid disease    THYROID DISEASE, HX OF 05/14/2008   VENTRICULAR TACHYCARDIA 02/26/2009   Past Surgical History:  Procedure Laterality Date   ANGIOPLASTY     with stent   CARDIAC ASSIST DEVICE REMOVAL     CARDIAC CATHETERIZATION     COLONOSCOPY     CORONARY ARTERY BYPASS GRAFT     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   LEFT VENTRICULAR ASSIST DEVICE  05/2009   [facility]   Patient Active Problem List   Diagnosis Date Noted   Immunosuppressed status (HCC) 02/23/2019   Chronic abdominal pain 12/27/2015   Heart transplanted (HCC) 06/19/2014   GERD (gastroesophageal reflux disease) 06/19/2014   Osteoarthritis  06/19/2014   BPH (benign prostatic hypertrophy) 02/26/2014   History of adenomatous polyp of colon 09/17/2013   Chronic systolic heart failure (HCC) 03/02/2009   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   AUTOMATIC IMPLANTABLE CARDIAC DEFIBRILLATOR SITU 02/26/2009   Diabetes mellitus type II, controlled (HCC) 12/13/2008   Hypothyroidism 05/14/2008   Essential hypertension 05/14/2008   ANEMIA, OTHER UNSPEC 04/03/2008   Hyperlipidemia associated with type 2 diabetes mellitus (HCC) 04/25/2007   Coronary atherosclerosis 04/25/2007    PCP: Shelva Majestic, MD  REFERRING PROVIDER: Richardean Sale, DO  REFERRING DIAG:  [phone] (ICD-10-CM) - Chronic left shoulder pain M19.012 (ICD-10-CM) - Primary osteoarthritis of left shoulder E11.618,M75.00 (ICD-10-CM) - 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  Hand dominance: Right  PERTINENT HISTORY: Heart transplant, DM II, HTN  PAIN:  Are you having pain?  No: NPRS scale: 0/10 Worst: 4/10 Pain location: L shoulder  Pain description: sharp, tight Aggravating factors: overhead reaching, cross arm Relieving factors: rest, heat  PRECAUTIONS: None  WEIGHT BEARING RESTRICTIONS: No  FALLS:  Has patient fallen in last 6 months? No  LIVING ENVIRONMENT: Lives with: lives with their family Lives in: House/apartment  OCCUPATION: Retired  PLOF: Independent  PATIENT GOALS: Improve L shoulder mobility with overhead reaching into  cabinets  OBJECTIVE:   DIAGNOSTIC FINDINGS:  N/A  PATIENT SURVEYS:  FOTO: 53% function; 65% predicted  COGNITION: Overall cognitive status: Within functional limits for tasks assessed     SENSATION: WFL  POSTURE: Rounded shoulders, fwd head  UPPER EXTREMITY ROM:   Active ROM Right eval Left eval Left 03/15/23  Shoulder flexion WFL 95 105  Shoulder extension     Shoulder abduction WFL 70 85  Shoulder adduction     Shoulder internal rotation WFL 30 35  Shoulder external rotation WFL 15 17  Elbow flexion     Elbow extension     Wrist flexion     Wrist extension     Wrist ulnar deviation     Wrist radial deviation     Wrist pronation     Wrist supination     (Blank rows = not tested)  UPPER EXTREMITY MMT:  MMT Right eval Left eval  Shoulder flexion    Shoulder extension    Shoulder abduction    Shoulder adduction    Shoulder internal rotation    Shoulder external rotation    Middle trapezius    Lower trapezius    Elbow flexion    Elbow extension    Wrist flexion    Wrist extension    Wrist ulnar deviation    Wrist radial deviation    Wrist pronation    Wrist supination    Grip strength (lbs)    (Blank rows = not tested)  SHOULDER SPECIAL TESTS: Impingement tests: DNT SLAP lesions: DNT Instability tests: DNT Rotator cuff assessment: DNT Biceps assessment: DNT  JOINT MOBILITY TESTING:  Decreasd mobility in capsular pattern  PALPATION:  TTP to L deltoid, R infraspinatus    TREATMENT: [facility] Adult PT Treatment:                                                DATE: 03/17/23 Therapeutic Exercise: UBE lvl 2.0 x 3'/3' fwd/bwd while taking subjective Row 3x10 20# Single arm row LUE 10# 2x10 Single arm shoulder extension LUE 7# 2x10 Physioball roll up wall 2x10 Supine horizontal abd 2x15 GTB Supine D2 flexion L 2x10 RTB Supine dow flexion 2x10 10# L shoulder ER 2x15 GTB Standing lat pulldown 3x10 25# Pulleys L shoulder flex x 2  min  [facility] Adult PT Treatment:                                                DATE: 03/15/23 Therapeutic Exercise: UBE lvl 2.0 x 3'/3' fwd/bwd while taking subjective Row 2x10 20# Single arm row LUE 13# 2x10 Single arm  shoulder extension LUE 7# 2x10 Seated double ER with scap retraction GTB 2x10 Seated horizontal abduction GTB 2x10 Standing lat pulldown 2x10 20# L UE ranger shoulder abd x 10 30' L shoulder wall slide x10 flex/abd (abd difficult) Therapeutic Activity: Re-administration and discussion of FOTO, measurements   [facility] Adult PT Treatment:                                                DATE: 03/09/23 Therapeutic Exercise: UBE lvl 2.0 x 3'/3' fwd/bwd while taking subjective Pulleys L shoulder flex/scap x 2 min each Row 3x10 17# Shoulder extension 20# 2x10 Supine dow flex 2x10 5# Supine dow chest press 2x15 10# Supine horizontal abd 2x10 blue band Standing lat pulldown 2x10 20# L UE ranger shoulder flex x 10 34' L shoulder wall slide 2x10 flex/abd (abd difficult)  [facility] Adult PT Treatment:                                                DATE: 03/03/23 Therapeutic Exercise: UBE lvl 2.0 x 2'/2' fwd/bwd while taking subjective Row 2x12 20# Shoulder extension 17# 2x10 Supine dow flex 2x15 2# Supine dow 2x10 5# S/L L shoulder abd 2x10 2# Supine horizontal abd 3x10 blue band Standing lat pulldown 2x10 20# Wall walk x 10 L shoulder flex L UE ranger shoulder flex x 10 34' L shoulder wall slide 2x10 flex/abd (abd difficult) Pulleys L shoulder flex/scap x 2 min each  PATIENT EDUCATION: Education details: continue HEP Person educated: Patient Education method: Explanation, Demonstration, and Handouts Education comprehension: verbalized understanding and returned demonstration  HOME EXERCISE PROGRAM: Access Code: [email or web address] URL: [email or web address] Date: 03/03/2023 Prepared by: Edwinna Areola  Exercises - Standing Ulnar Nerve Glide  - 1 x daily - 7 x weekly - 3  sets - 10 reps - Standing Shoulder Row with Anchored Resistance  - 1 x daily - 7 x weekly - 3 sets - 10 reps - blue band hold - Seated Upper Trapezius Stretch  - 1 x daily - 7 x weekly - 2 reps - 30 sec hold - Supine Quadricep Sets  - 1 x daily - 7 x weekly - 2 sets - 10 reps - 5 sec hold - Active Straight Leg Raise with Quad Set  - 1 x daily - 7 x weekly - 2 sets - 10 reps - Seated Hip Abduction with Resistance  - 1 x daily - 7 x weekly - 3 sets - 15 reps - Seated Wrist Flexion Stretch  - 1 x daily - 7 x weekly - 2 reps - 30 sec hold  ASSESSMENT:  CLINICAL IMPRESSION: Pt was able to complete all prescribed exercises with no adverse effect. Therapy focused on improving L shoulder strength and ROM. Is progressing as expected, will continue per POC.   EVAL- Patient is a 67 y.o. M who was seen today for physical therapy evaluation and treatment for L shoulder pain and stiffness. Physical findings are consistent with physician impression as pt has significant decrease in L shoulder ROM in capsular pattern. His FOTO score shows significant decrease in subjective functional ability below PLOF. Pt would benefit from skilled PT services, will continue per POC as prescribed and progress as able.  OBJECTIVE IMPAIRMENTS: decreased activity tolerance, decreased endurance, decreased mobility, decreased ROM, decreased strength, impaired UE functional use, and pain.   ACTIVITY LIMITATIONS: carrying, lifting, bathing, dressing, self feeding, reach over head, hygiene/grooming, and caring for others  PARTICIPATION LIMITATIONS: meal prep, cleaning, driving, shopping, community activity, occupation, and yard work  PERSONAL FACTORS: Fitness and 3+ comorbidities: Heart transplant, DM II, HTN  are also affecting patient's functional outcome.    GOALS: Goals reviewed with patient? No  SHORT TERM GOALS: Target date: 02/16/2023   Pt will be compliant and knowledgeable with initial HEP for improved comfort and  carryover Baseline: initial HEP given  Goal status: MET Pt reports adherence 02/15/23  2.  Pt will self report left shoulder pain no greater than 6/10 for improved comfort and functional ability Baseline: 10/10 at worst Goal status: MET Pt reports 8/10 at worst  03/15/23: 4/10 at worst  LONG TERM GOALS: Target date: 03/23/2023   Pt will improve FOTO function score to no less than 65% as proxy for functional improvement with home ADLs and community activities  Baseline: 53% function Goal status: Progressing 03/15/23: 63%   2.  Pt will self report left shoulder pain no greater than 3/10 for improved comfort and functional ability Baseline: 10/10 at worst Goal status: Progressing     3.  Pt will improve L shoulder flex/abd AROM to no less than 125 degrees for improved functional ability for overhead reaching into cabinets  Baseline: see ROM chart Goal status: Progressing  4.  Pt will improve L shoulder strength to at least 4/5 for all tested motions for improved stability with overhead activities  Baseline: see MMT chart Goal status: INITIAL  PLAN:  PT FREQUENCY: 1-2x/week  PT DURATION: 8 weeks  PLANNED INTERVENTIONS: Therapeutic exercises, Therapeutic activity, Neuromuscular re-education, Balance training, Gait training, Patient/Family education, Self Care, Joint mobilization, Dry Needling, Electrical stimulation, Cryotherapy, Moist heat, Vasopneumatic device, and Re-evaluation  PLAN FOR NEXT SESSION: assess HEP response, strength and AAROM, PROM to L shoulder  Referring diagnosis?  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 (if different than referring diagnosis)  Stiffness of left shoulder, not elsewhere classified  Muscle weakness (generalized) Localized edema  What was this  (referring dx) caused by? []  Surgery []  Fall [x]  Ongoing issue []  Arthritis []  Other: ____________  Laterality: []  Rt [x]  Lt []  Both  Check all possible CPT codes:  *CHOOSE 10 OR LESS*    [x]  97110 (Therapeutic Exercise)  []  92507 (SLP Treatment)  [x]  97112 (Neuro Re-ed)   []  92526 (Swallowing Treatment)   [x]  97116 (Gait Training)   []  [id number] (Cognitive Training, 1st 15 minutes) [x]  97140 (Manual Therapy)   []  97130 (Cognitive Training, each add'l 15 minutes)  [x]  97164 (Re-evaluation)                              []  Other, List CPT Code ____________  [x]  97530 (Therapeutic Activities)     [x]  97535 (Self Care)   []  All codes above (97110 - 97535)  []  16109 (Mechanical Traction)  [x]  97014 (E-stim Unattended)  [x]  97032 (E-stim manual)  []  97033 (Ionto)  []  97035 (Ultrasound) []  97750 (Physical Performance Training) []  [id number] (Aquatic Therapy) [x]  97016 (Vasopneumatic Device) []  [id number] (Paraffin) []  97034 (Contrast Bath) []  97597 (Wound Care 1st 20 sq cm) []   [phone] (Wound Care each add'l 20 sq cm) []  97760 (Orthotic Fabrication, Fitting, Training Initial) []  [id number] (Prosthetic Management and Training Initial) []  [id number] (Orthotic or Prosthetic Training/ Modification Subsequent)   Eloy End, PT 03/17/2023, 4:09 PM

## 2023-03-17 NOTE — Unmapped (Signed)
[name] 's Mycophenolate shipment will be canceled  as a result of the medication is too soon to refill until 05/01/2023.     I have reached out to the patient  at [phone] and communicated the delivery change. We will not reschedule the medication and have removed this/these medication(s) from the work request.  We have not confirmed the new delivery date.

## 2023-03-21 ENCOUNTER — Other Ambulatory Visit: Payer: Self-pay | Admitting: Family Medicine

## 2023-03-22 ENCOUNTER — Ambulatory Visit: Payer: Medicare HMO

## 2023-03-22 DIAGNOSIS — M6281 Muscle weakness (generalized): Secondary | ICD-10-CM

## 2023-03-22 DIAGNOSIS — M25612 Stiffness of left shoulder, not elsewhere classified: Secondary | ICD-10-CM | POA: Diagnosis not present

## 2023-03-22 NOTE — Therapy (Signed)
OUTPATIENT PHYSICAL THERAPY TREATMENT NOTE/DISCHARGE  PHYSICAL THERAPY DISCHARGE SUMMARY  Visits from Start of Care: 12  Current functional level related to goals / functional outcomes: See goals and objective   Remaining deficits: See goals and objective   Education / Equipment: HEP   Patient agrees to discharge. Patient goals were partially met. Patient is being discharged due to being pleased with the current functional level.   Patient Name: [name] MRN: [id number] DOB:1955-12-08, 67 y.o., male Today's Date: 03/22/2023  END OF SESSION:  PT End of Session - 03/22/23 1526     Visit Number 12    Number of Visits 17    Date for PT Re-Evaluation 03/23/23    Authorization Type [employer] M[employer]    Authorization Time Period 10 visits 03/15/23-04/30/23    Authorization - Number of Visits 10    PT Start Time 1530    PT Stop Time 1600    PT Time Calculation (min) 30 min    Activity Tolerance Patient tolerated treatment well    Behavior During Therapy WFL for tasks assessed/performed                  Past Medical History:  Diagnosis Date   Abdominal pain, epigastric 03/31/2009   Acute on chronic systolic heart failure (HCC) 03/02/2009   ANEMIA, OTHER UNSPEC 04/03/2008   AUTOMATIC IMPLANTABLE CARDIAC DEFIBRILLATOR SITU 02/26/2009   on old heart   Blood transfusion without reported diagnosis    CHF (congestive heart failure) (HCC)    CORONARY ARTERY DISEASE 04/25/2007   DIABETES MELLITUS, TYPE II 12/13/2008   no meds, diet contolled   GERD (gastroesophageal reflux disease)    HYPERLIPIDEMIA 04/25/2007   Hypertension    HYPERTENSION, HX OF 05/14/2008   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   MYOCARDIAL INFARCTION, 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   OBESITY 02/26/2009   Thyroid disease    THYROID DISEASE, HX OF 05/14/2008   VENTRICULAR TACHYCARDIA 02/26/2009   Past Surgical History:  Procedure Laterality Date   ANGIOPLASTY     with stent   CARDIAC ASSIST DEVICE REMOVAL      CARDIAC CATHETERIZATION     COLONOSCOPY     CORONARY ARTERY BYPASS GRAFT     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   LEFT VENTRICULAR ASSIST DEVICE  05/2009   [facility]   Patient Active Problem List   Diagnosis Date Noted   Immunosuppressed status (HCC) 02/23/2019   Chronic abdominal pain 12/27/2015   Heart transplanted (HCC) 06/19/2014   GERD (gastroesophageal reflux disease) 06/19/2014   Osteoarthritis 06/19/2014   BPH (benign prostatic hypertrophy) 02/26/2014   History of adenomatous polyp of colon 09/17/2013   Chronic systolic heart failure (HCC) 03/02/2009   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   AUTOMATIC IMPLANTABLE CARDIAC DEFIBRILLATOR SITU 02/26/2009   Diabetes mellitus type II, controlled (HCC) 12/13/2008   Hypothyroidism 05/14/2008   Essential hypertension 05/14/2008   ANEMIA, OTHER UNSPEC 04/03/2008   Hyperlipidemia associated with type 2 diabetes mellitus (HCC) 04/25/2007   Coronary atherosclerosis 04/25/2007    PCP: Shelva Majestic, MD  REFERRING PROVIDER: Richardean Sale, DO  REFERRING DIAG:  [phone] (ICD-10-CM) - Chronic left shoulder pain M19.012 (ICD-10-CM) - Primary osteoarthritis of left shoulder E11.618,M75.00 (ICD-10-CM) - 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 Has been compliant with HEP.   Hand dominance: Right  PERTINENT HISTORY: Heart transplant, DM II, HTN  PAIN:  Are you having pain?  No: NPRS scale: 0/10 Worst: 4/10 Pain location: L shoulder  Pain  description: sharp, tight Aggravating factors: overhead reaching, cross arm Relieving factors: rest, heat  PRECAUTIONS: None  WEIGHT BEARING RESTRICTIONS: No  FALLS:  Has patient fallen in last 6 months? No  LIVING ENVIRONMENT: Lives with: lives with their family Lives in: House/apartment  OCCUPATION: Retired  PLOF: Independent  PATIENT GOALS: Improve L shoulder mobility with overhead reaching into cabinets  OBJECTIVE:   DIAGNOSTIC FINDINGS:  N/A  PATIENT SURVEYS:  FOTO: 53% function; 65% predicted 03/15/2023: 63% function 03/22/2023: 61% function  COGNITION: Overall cognitive status: Within functional limits for tasks assessed     SENSATION: WFL  POSTURE: Rounded shoulders, fwd head  UPPER EXTREMITY ROM:   Active ROM Right eval Left eval Left 03/15/23 Left 03/22/2023  Shoulder flexion WFL 95 105 105  Shoulder extension      Shoulder abduction WFL 70 85 85  Shoulder adduction      Shoulder internal rotation WFL 30 35 36  Shoulder external rotation WFL 15 17 20   Elbow flexion      Elbow extension      Wrist flexion      Wrist extension      Wrist ulnar deviation      Wrist radial deviation      Wrist pronation      Wrist supination      (Blank rows = not tested)  UPPER EXTREMITY MMT:  MMT Right eval Left eval  Shoulder flexion    Shoulder extension    Shoulder abduction    Shoulder adduction    Shoulder internal rotation    Shoulder external rotation    Middle trapezius    Lower trapezius    Elbow flexion    Elbow extension    Wrist flexion    Wrist extension    Wrist ulnar deviation    Wrist radial deviation    Wrist pronation    Wrist supination    Grip strength (lbs)    (Blank rows = not tested)  SHOULDER SPECIAL TESTS: Impingement tests: DNT SLAP lesions: DNT Instability tests: DNT Rotator cuff assessment: DNT Biceps assessment: DNT  JOINT MOBILITY TESTING:  Decreasd mobility in capsular pattern  PALPATION:   TTP to L deltoid, R infraspinatus    TREATMENT: [facility] Adult PT Treatment:                                                DATE: 03/22/23 Therapeutic Exercise: UBE lvl 2.0 x 3'/3' fwd/bwd while taking subjective Row 2x10 black band Shoulder ext x 10 black band L shoulder ER x 10 GTB L shoulder wall slide flex/abd x 10 Supine dow flexion x 10 Supine D2 flexion L 2x10 GTB Seated ER AAROM x 10  Therapeutic Activity: Assessment of tests/measures, goals, and outcomes for discharge assessment  [facility] Adult PT Treatment:  DATE: 03/17/23 Therapeutic Exercise: UBE lvl 2.0 x 3'/3' fwd/bwd while taking subjective Row 3x10 20# Single arm row LUE 10# 2x10 Single arm shoulder extension LUE 7# 2x10 Physioball roll up wall 2x10 Supine horizontal abd 2x15 GTB Supine D2 flexion L 2x10 RTB Supine dow flexion 2x10 10# L shoulder ER 2x15 GTB Standing lat pulldown 3x10 25# Pulleys L shoulder flex x 2 min  [facility] Adult PT Treatment:                                                DATE: 03/15/23 Therapeutic Exercise: UBE lvl 2.0 x 3'/3' fwd/bwd while taking subjective Row 2x10 20# Single arm row LUE 13# 2x10 Single arm shoulder extension LUE 7# 2x10 Seated double ER with scap retraction GTB 2x10 Seated horizontal abduction GTB 2x10 Standing lat pulldown 2x10 20# L UE ranger shoulder abd x 10 30' L shoulder wall slide x10 flex/abd (abd difficult) Therapeutic Activity: Re-administration and discussion of FOTO, measurements   [facility] Adult PT Treatment:                                                DATE: 03/09/23 Therapeutic Exercise: UBE lvl 2.0 x 3'/3' fwd/bwd while taking subjective Pulleys L shoulder flex/scap x 2 min each Row 3x10 17# Shoulder extension 20# 2x10 Supine dow flex 2x10 5# Supine dow chest press 2x15 10# Supine horizontal abd 2x10 blue band Standing lat pulldown 2x10 20# L UE ranger shoulder flex x 10 34' L shoulder wall slide 2x10  flex/abd (abd difficult)  [facility] Adult PT Treatment:                                                DATE: 03/03/23 Therapeutic Exercise: UBE lvl 2.0 x 2'/2' fwd/bwd while taking subjective Row 2x12 20# Shoulder extension 17# 2x10 Supine dow flex 2x15 2# Supine dow 2x10 5# S/L L shoulder abd 2x10 2# Supine horizontal abd 3x10 blue band Standing lat pulldown 2x10 20# Wall walk x 10 L shoulder flex L UE ranger shoulder flex x 10 34' L shoulder wall slide 2x10 flex/abd (abd difficult) Pulleys L shoulder flex/scap x 2 min each  PATIENT EDUCATION: Education details: continue HEP Person educated: Patient Education method: Explanation, Demonstration, and Handouts Education comprehension: verbalized understanding and returned demonstration  HOME EXERCISE PROGRAM: Access Code: [email or web address] URL: [email or web address] Date: 03/22/2023 Prepared by: Edwinna Areola  Exercises - Standing Shoulder Row with Anchored Resistance  - 4 x weekly - 3 sets - 10 reps - black band hold - Shoulder extension with resistance - Neutral  - 4 x weekly - 3 sets - 10 reps - black band hold - Shoulder External Rotation with Anchored Resistance  - 4 x weekly - 3 sets - 10 reps - green band hold - Shoulder Flexion Wall Slide with Towel  - 4 x weekly - 2 sets - 10 reps - Standing Shoulder Abduction Slides at Wall  - 4 x weekly - 2 sets - 10 reps - Supine Shoulder Flexion Extension AAROM with Dowel  - 4 x weekly - 3 sets - 10 reps -  Supine PNF D2 Flexion with Resistance  - 4 x weekly - 3 sets - 10 reps - green band  hold - Seated Shoulder External Rotation AAROM with Dowel  - 4 x weekly - 2 sets - 10 reps  ASSESSMENT:  CLINICAL IMPRESSION: Pt was able to complete all prescribed exercises and demonstrated knowledge of HEP with no adverse effect. Over the course of PT treatment he has progressed fairly well, with modest improvements in L shoulder ROM and strength, noting increased functional ability  subjectively and with increased FOTO score. Most significant is decrease in pain to roughly 3/10 at worst compared to 10/10 at initial eval. He should continue to slowly improve with HEP compliance and feels good about where his is currently at functionally.   EVAL- Patient is a 67 y.o. M who was seen today for physical therapy evaluation and treatment for L shoulder pain and stiffness. Physical findings are consistent with physician impression as pt has significant decrease in L shoulder ROM in capsular pattern. His FOTO score shows significant decrease in subjective functional ability below PLOF. Pt would benefit from skilled PT services, will continue per POC as prescribed and progress as able.   OBJECTIVE IMPAIRMENTS: decreased activity tolerance, decreased endurance, decreased mobility, decreased ROM, decreased strength, impaired UE functional use, and pain.   ACTIVITY LIMITATIONS: carrying, lifting, bathing, dressing, self feeding, reach over head, hygiene/grooming, and caring for others  PARTICIPATION LIMITATIONS: meal prep, cleaning, driving, shopping, community activity, occupation, and yard work  PERSONAL FACTORS: Fitness and 3+ comorbidities: Heart transplant, DM II, HTN  are also affecting patient's functional outcome.    GOALS: Goals reviewed with patient? No  SHORT TERM GOALS: Target date: 02/16/2023   Pt will be compliant and knowledgeable with initial HEP for improved comfort and carryover Baseline: initial HEP given  Goal status: MET Pt reports adherence 02/15/23  2.  Pt will self report left shoulder pain no greater than 6/10 for improved comfort and functional ability Baseline: 10/10 at worst Pt reports 8/10 at worst  03/15/23: 4/10 at worst Goal status: MET  LONG TERM GOALS: Target date: 03/23/2023   Pt will improve FOTO function score to no less than 65% as proxy for functional improvement with home ADLs and community activities  Baseline: 53% function 03/15/2023: 63%  function 03/22/2023: 61% function Goal status: MOSTLY MET  2.  Pt will self report left shoulder pain no greater than 3/10 for improved comfort and functional ability Baseline: 10/10 at worst 03/22/2023: 3/10 at worst Goal status: MET    3.  Pt will improve L shoulder flex/abd AROM to no less than 125 degrees for improved functional ability for overhead reaching into cabinets  Baseline: see ROM chart Goal status: NOT MET  4.  Pt will improve L shoulder strength to at least 4/5 for all tested motions for improved stability with overhead activities  Baseline: see MMT chart Goal status: MET  PLAN:  PT FREQUENCY: 1-2x/week  PT DURATION: 8 weeks  PLANNED INTERVENTIONS: Therapeutic exercises, Therapeutic activity, Neuromuscular re-education, Balance training, Gait training, Patient/Family education, Self Care, Joint mobilization, Dry Needling, Electrical stimulation, Cryotherapy, Moist heat, Vasopneumatic device, and Re-evaluation  PLAN FOR NEXT SESSION: assess HEP response, strength and AAROM, PROM to L shoulder  Referring diagnosis?  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 (if different than referring diagnosis)  Stiffness of left shoulder,  not elsewhere classified  Muscle weakness (generalized) Localized edema  What was this (referring dx) caused by? []  Surgery []  Fall [x]  Ongoing issue []  Arthritis []  Other: ____________  Laterality: []  Rt [x]  Lt []  Both  Check all possible CPT codes:  *CHOOSE 10 OR LESS*    [x]  97110 (Therapeutic Exercise)  []  92507 (SLP Treatment)  [x]  97112 (Neuro Re-ed)   []  92526 (Swallowing Treatment)   [x]  97116 (Gait Training)   []  [id number] (Cognitive Training, 1st 15 minutes) [x]  40981 (Manual Therapy)   []  97130 (Cognitive Training, each add'l 15  minutes)  [x]  19147 (Re-evaluation)                              []  Other, List CPT Code ____________  [x]  97530 (Therapeutic Activities)     [x]  97535 (Self Care)   []  All codes above (97110 - 97535)  []  97012 (Mechanical Traction)  [x]  97014 (E-stim Unattended)  [x]  97032 (E-stim manual)  []  97033 (Ionto)  []  97035 (Ultrasound) []  97750 (Physical Performance Training) []  [id number] (Aquatic Therapy) [x]  97016 (Vasopneumatic Device) []  [id number] (Paraffin) []  97034 (Contrast Bath) []  82956 (Wound Care 1st 20 sq cm) []  97598 (Wound Care each add'l 20 sq cm) []  97760 (Orthotic Fabrication, Fitting, Training Initial) []  [id number] (Prosthetic Management and Training Initial) []  [id number] (Orthotic or Prosthetic Training/ Modification Subsequent)   Eloy End, PT 03/22/2023, 4:12 PM

## 2023-03-22 NOTE — Progress Notes (Unsigned)
Aleen Sells D.[facility] [address] [place] [address] Phone: [phone]   Assessment and Plan:     There are no diagnoses linked to this encounter.  ***   Pertinent previous records reviewed include ***   Follow Up: ***     Subjective:   I, Kimmora Risenhoover, am serving as a Neurosurgeon for Doctor Richardean Sale   Chief Complaint: left shoulder pain    HPI:    07/05/2022 Patient is a 67 year old male complaining of left shoulder pain. Patient states since the end of June he has been in pain , he was reaching behind him and he felt immediate pain , decreased ROM , some numbness or tingling, no meds , pain radiates down the elbow up the neck and to the shoulder blade , sometimes pain down to hs fingers    08/04/2022 Patient states he is doing just fine    08/25/2022 Patient states doesn't hurt as much still decreased mobility    10/06/2022 Patient states he is doing just fine    01/04/2023 Patient states its there   03/23/2023 Patient states     Relevant Historical Information: GERD, DM type II, hypothyroidism, history of heart transplant  Additional pertinent review of systems negative.   Current Outpatient Medications:    ACCU-CHEK GUIDE test strip, USE 1 TEST STRIP  4 TIMES DAILY, Disp: 100 each, Rfl: 0   amLODipine (NORVASC) 10 MG tablet, Take 10 mg by mouth daily. Per [facility], Disp: , Rfl:    aspirin EC 81 MG tablet, Take 1 tablet (81 mg total) by mouth daily., Disp: 30 tablet, Rfl: 0   atorvastatin (LIPITOR) 80 MG tablet, Take 1 tablet (80 mg total) by mouth daily at 6 PM., Disp: 30 tablet, Rfl: 0   blood glucose meter kit and supplies KIT, Dispense based on patient and insurance preference. Use up to four times daily as directed., Disp: 1 each, Rfl: 0   chlorthalidone (HYGROTON) 25 MG tablet, Take 1 tablet (25 mg total) by mouth daily., Disp: 30 tablet, Rfl: 0   cholecalciferol (VITAMIN D) 1000 units tablet, Take 3  tablets (3,000 Units total) by mouth daily., Disp: 90 tablet, Rfl: 0   diltiazem (TIAZAC) 180 MG 24 hr capsule, Take 2 capsules (360 mg total) by mouth daily., Disp: 60 capsule, Rfl: 0   ezetimibe (ZETIA) 10 MG tablet, Take 1 tablet by mouth once daily, Disp: 90 tablet, Rfl: 0   famotidine (PEPCID) 20 MG tablet, Take 20 mg by mouth in the morning and at bedtime., Disp: , Rfl:    levothyroxine (SYNTHROID, LEVOTHROID) 75 MCG tablet, Take 1 tablet (75 mcg total) by mouth daily before breakfast., Disp: 30 tablet, Rfl: 0   magnesium oxide (MAG-OX) 400 MG tablet, Take 1.5 tablets (600 mg total) by mouth 2 (two) times daily., Disp: 45 tablet, Rfl: 0   metFORMIN (GLUCOPHAGE) 1000 MG tablet, TAKE 1 TABLET BY MOUTH TWICE DAILY WITH MEALS, Disp: 180 tablet, Rfl: 0   mycophenolate (CELLCEPT) 500 MG tablet, Take 1 tablet (500 mg total) by mouth 2 (two) times daily., Disp: 60 tablet, Rfl: 0   tacrolimus (PROGRAF) 0.5 MG capsule, Take 2 capsules (1 mg total) by mouth 2 (two) times daily. (Patient taking differently: Take by mouth 2 (two) times daily. 2 tablets in the AM 1 tablet in the PM), Disp: 120 capsule, Rfl: 0   tamsulosin (FLOMAX) 0.4 MG CAPS capsule, Take 0.4 mg by mouth., Disp: ,  Rfl:    valsartan (DIOVAN) 160 MG tablet, Take 320 mg by mouth daily. , Disp: , Rfl:    Objective:     There were no vitals filed for this visit.    There is no height or weight on file to calculate BMI.    Physical Exam:    ***   Electronically signed by:  Aleen Sells [facility] 7:14 AM 03/22/23

## 2023-03-23 ENCOUNTER — Ambulatory Visit: Payer: Medicare HMO | Admitting: Sports Medicine

## 2023-03-23 VITALS — BP 130/78 | HR 71 | Ht 68.0 in | Wt 233.0 lb

## 2023-03-23 DIAGNOSIS — M25512 Pain in left shoulder: Secondary | ICD-10-CM | POA: Diagnosis not present

## 2023-03-23 DIAGNOSIS — E11618 Type 2 diabetes mellitus with other diabetic arthropathy: Secondary | ICD-10-CM | POA: Diagnosis not present

## 2023-03-23 DIAGNOSIS — M19012 Primary osteoarthritis, left shoulder: Secondary | ICD-10-CM

## 2023-03-23 DIAGNOSIS — G8929 Other chronic pain: Secondary | ICD-10-CM | POA: Diagnosis not present

## 2023-03-23 DIAGNOSIS — M7500 Adhesive capsulitis of unspecified shoulder: Secondary | ICD-10-CM | POA: Diagnosis not present

## 2023-03-27 ENCOUNTER — Other Ambulatory Visit: Payer: Self-pay | Admitting: Family Medicine

## 2023-03-30 DIAGNOSIS — R109 Unspecified abdominal pain: Secondary | ICD-10-CM | POA: Diagnosis not present

## 2023-03-30 DIAGNOSIS — N401 Enlarged prostate with lower urinary tract symptoms: Secondary | ICD-10-CM | POA: Diagnosis not present

## 2023-03-30 DIAGNOSIS — R3912 Poor urinary stream: Secondary | ICD-10-CM | POA: Diagnosis not present

## 2023-03-30 DIAGNOSIS — R3915 Urgency of urination: Secondary | ICD-10-CM | POA: Diagnosis not present

## 2023-03-30 DIAGNOSIS — R3914 Feeling of incomplete bladder emptying: Secondary | ICD-10-CM | POA: Diagnosis not present

## 2023-04-13 DIAGNOSIS — R3914 Feeling of incomplete bladder emptying: Secondary | ICD-10-CM | POA: Diagnosis not present

## 2023-04-13 DIAGNOSIS — R338 Other retention of urine: Secondary | ICD-10-CM | POA: Diagnosis not present

## 2023-04-13 DIAGNOSIS — R109 Unspecified abdominal pain: Secondary | ICD-10-CM | POA: Diagnosis not present

## 2023-04-19 DIAGNOSIS — E119 Type 2 diabetes mellitus without complications: Secondary | ICD-10-CM | POA: Diagnosis not present

## 2023-04-19 DIAGNOSIS — H35033 Hypertensive retinopathy, bilateral: Secondary | ICD-10-CM | POA: Diagnosis not present

## 2023-04-19 DIAGNOSIS — H5213 Myopia, bilateral: Secondary | ICD-10-CM | POA: Diagnosis not present

## 2023-04-19 NOTE — Unmapped (Incomplete)
[facility] Heart Transplant Clinic Visit         Primary Provider: Domenic Schwab, MD   [address] [place] [address]    Other providers:  Melvia Heaps, MD, GI [facility]  Jethro Bolus, Urology    Reason for Visit:  Andrew Dickerson is a 67 y.o. male who is being seen for continued post-transplant care as his annual heart transplant clinic follow up.     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  On dual immunosuppression (Mycophenolate 500 bid and Tacrolimus 1 mg /0.5 mg with goal 5-8) with normal cardiac function as again seen on today's echo.  No significant DSA's detected after 2018 (previously occasional single low level DSA (DR 16). No CAV.  - Recent Tac levels below, at goal. (Last tac dose adjustment 01/2018).  No changes today (stable Cr).    Lab Results   Component Value Date    Tacrolimus, Trough 5.1 02/25/2023    Tacrolimus, Trough 6.9 12/29/2022    Tacrolimus, Trough 7.6 11/29/2014    Tacrolimus, Trough 9.2 07/24/2014   - His next diagnostic testing will be a***  - Return to clinic 1 year, sooner PRN    # Hypertension.  Well controlled on his current 4 drug regimen (Amlodipine 10 qAM, Diltiazem 360 qPM, Valsartan 320 mg, Chlorthalidone 25 qAM). (Amlodipine increased from 5 to 10 in 07/2018.)     Patient was transitioned from Lisinopril to Valsartan 160 mg daily, 08/2019 due to elevated blood pressures. He was further increased to 320 mg daily 09/2019 after BP remained elevated. BP today 114/56.  - Home BP range (per his home log) 125-155/60-70 over the last month; as before reports his machines reads his systolic BP about 20 mm Hg higher than others.   - No changes     # Hyperlipidemia. Tolerating statin therapy - Atorvastatin 80 mg. LDL 70 ( 02/25/23) Previously LDL 63 (12/10/21) 68 (04/24/21), 61 (10/22/2020), 95-109 (in 2020-21).  Lab Results   Component Value Date    CHOL 129 02/25/2023    LDL 70 02/25/2023    HDL 36 (L) 02/25/2023    TRIG 146 02/25/2023    A1C 6.7 (H) 02/25/2023   - LDL at goal, at 70 with good A1c and no CAV.  No changes     # Diabetes (type II, on oral therapy)  # Obesity   On Metformin 1000 mg BID since at least 2017, managed by PCP.    - Most recent HgbA1c 6.7% 02/25/23 (previously as high as 13.5% 12/2015; well-controlled since). Checks BG once daily.    - Encouraged exercise and weight loss (to help with abd hernia); encouraged weight of 220 or less    # Umbilical hernia.  He has had a chronic abdominal umbilical hernia but has a more prominent mid abdominal ventral hernia with palpable tenderness along the left side.  We discussed in 2022 that definitive therapy would be future surgery.  Was intermittently leaking at our 04/2022 visit (since 03/2022) and more taut but no clear incarcerated hernia.  Measured approx 6x5 (see photo), scab in right lower area  - 06/2022 Had abdominal CT and was seen by Dr Dellis Anes, patient preferred conservative mgmt. Was due for follow up  in 6 months. Follow up due    # Lung Nodule   Seen on abdominal CT 06/2022 --  Impression   -There  is a 1.1 cm right adrenal nodule with imaging characteristics consistent with benign adrenal adenoma.      - There is a 4 mm right middle lobe pulmonary nodule. Consider follow-up CT in 12 months to ensure stability.     Repeat in about 1 year 06/2023 - pt prefers local but has cardiac testing in February  - Plan for 10/2023 dedicate chest CT noncontrast to compare to 06/2022.    # Other stable medical problems:  - Stage 2-3 CKD. Cr range 1.3- 1.6 since 2021 (02/26/20 Cr peaked at 1.6, when Tac level 6), <1.3 before 2021.  We discussed at past visits adequate hydration to minimize CKD with many years of tacrolimus therapy.   Continue to monitor  - Magnesium supplementation. Most recent Mg levels WNL on Mg 400 mg BID, had been on 250 mg BID but was discontinued in January 2024 and changed to 400 mg BID (was previously 400 mg BID; a higher dose caused diarrhea).  No changes  - BPH.  On Flomax BID, with tolerable nocturia (2-3x/night) and occasional urgency  leaking.  H/o urinary urgency which he didn't have in 2019 when we stopped Flomax (when unclear if he needed it).  Reported in 2021 that his prostate was microwaved (internally) years ago (pre-LVAD) by Dr. Patsi Sears of [facility] who retired.  Last urology visit was ~2017. Has again established care at [facility] urology for urinary retention, has upcoming appt Friday. PSA WNL as below.  - GERD. on Famotidine BID (previously BID). Previously on Omeprazole. Only noted heartburn when supine in or on right side.  Continue Famotidine (he desired no change in regimen in 2021 but this could be as needed).    - Hypothyroidism. TSH - 0.89 (02/25/23) on supplementation Synthroid 75 mcg (TSH WNL since 2012) - no change.   - Musculoskeletal knee pain and lower back pain:  Follow-up with PCP PRN     # Health maintenance     -Dental: Has dentures  -Eyes: Is screened annually. Last visit 04/2023  >Cancer Screening  -PSA: 0.66 (09/15/22)  -Dermatology: Dr. Margo Aye - locally. No hx of skin cancers, does not recall his last visit, recommended follow up if due.  -Colonoscopy: Last 04/2017, follow up due 2028 (Dr. Arlyce Dice, [facility])  -CXR: 09/15/22 Clear lungs  >Endocrine  -TSH as above  -Vitamin D - 52 (09/15/22),(12/10/21) 46 (04/24/2021),on Vitamin D 3000 units daily - no change  -HgA1C  - as above.  >ID/Vaccincations:   - Flu Shot:  06/30/22, due Fall 2024  - Pneumovax:  Prevnar13: 07/18/12.  PPSV23: 02/23/2019, PCV 20: 09/15/22  - Tetanus: 05/12/22  - Shingrix: 09/15/22, had 2nd one locally at [facility], will call to update   - COVID 19: 11/04/19, 12/01/19, 10/22/20 06/18/22 - due in the fall. (Had Covid 04/2020 with nausea, diarrhea, fatigue; resolved without specific therapy)       Patient was also seen by Melene Muller, RN, transplant coordinator, who also served as a Neurosurgeon.    - Joya Gaskins, MD      History of Present Illness:  PHOENIXX HOCHBERG is a 67 y.o. male with underwent LVAD, then a heart transplantation for ischemic cardiomyopathy on 07/18/2012. His post-transplant course has been notable for only for labile DM control (hospitalization locally for DKA 12/2015).  His cardiac status has been normal/stable, no CAV, occasional single DSA with MFI >1000, and he remains on chronic dual immunosuppression of  Tacrolimus and Mycophenolate.  His cardiac transplant-related diagnostic testing is  detailed below.  His last endomyocardial biopsy was 08/24/16 (ISHLT grade 1R/1A).     Summary of recent visits/events:  - 02/01/2017 'annual' post-transplant [facility] clinic for his clinic visit.  Overall well, no medication changes, with now impressive control of his diabetes (HgA1c 5.4), well-controlled BP on 4 antihypertensives, but he still could lose weight (lowest post-transplant weight was 218 lbs last year 02/2016, when HgA1c was 7.9). Thus, we encouraged more exercise for intentional weight loss (goal <220 lbs), and to maintain current good health maintenance.    - 01/17/2018 'annual' post-transplant [facility] clinic: doing overall well; medication changes that day: decrease Tacrolimus back to 1 mg qAM, 0.5 mg qPM.  In 11/2017, he URI, saw PCP for cough, fever, given doxycycline x 1 week and Tessalon perles with resolution of symptoms.  Well since, trying to lose weight, watching his diet (records all his food intake), goes to gym to walk 4-5 miles, 3x week MWF (goal of ~6000 steps/day on average), DM well controlled on metformin.  - 02/21/19 Video visit as 'annual' post-transplant Cardiology clinic: Overall doing well (with interim med changes by our CPP: Omeprazole changed to Famotidine 20 mg BID, and tamsulosin discontinued 07/2018).  Med changes with 02/2019 visit: Double MgOxide 400 mg softgels to 800 mg/day.  Health maintenance reviewed - e.g., See PCP soon to update immunizations (PPSV23, Td, Shringrix)Repeat labs this month (usually [facility]), including repeat HgA1c to reassess home monitor accuracy and metformin, as tries to exercise more and lose weight   - 03/25/20 annual [facility] cardiology post-transplant clinic visit [facility]): Overall doing well. Restart Flomax for urinary urgency which may also help BP and Cr.   In 09/2019, Lisinopril transitioned to Valsartan which was later increased to 320 mg/d; MgOxide increased to 800 mg BID.  -06/02/21 annual  [facility] cardiology clinic visit [facility]):  No medication changes.  Health maintenance reviewed   -04/20/22 annual [facility] cardiology clinic visit [facility]):   Doing well. No medication changes.  Abdominal CT then surgical referral to repair umbilical hernia.  Health maintenance reviewed as below - cardiac testing (DSE in 6 months); updating immunizations - e.g., Shingrix, Tetanus, COVID booster, PCV20.     Interval History:  Since his last visit 04/2022, doing well, no interim hospital/ED visits.      Mild complaints of dizziess/blurry vision when looking at something high up ( for instance a menu )    He is seeing a Urologist locally [facility]) Dr Arita Miss for urinary retention,to be straight cathed soon (since he has difficulty doing urination in the office to measure post-void residual) - has appt upcoming on Friday; she (Dr. Arita Miss) requested his Flomax be increased to BID ( per patient), he is now also recording his I and O and reports his stream is weak .              OLD  No complaints today. Occasionally feels tightness in his abdomen, more so the last week.  Had issue with umbilical hernia leaking clear fluid in 03/2022 for several days (never got abd CT), saw PCP, who held chlorthalidone x 3 days in 03/2022, advised Lasix 20 mg BID x 3 days, mupirocin ointment TID - with relative resolution.  Now has a scab over the area, no pain but recently leaking a little fluid since Sunday.    PCP.  Now off Lasix, back on chlorthalidone since then (late 03/2022).    No other symptoms.  He reports no dyspnea at rest or with exertion; no  orthopnea, PND, chest pain, palpitations, dizziness/lightheadnedness, presyncope, syncope.  No peripheral or abdominal swelling.  No nausea, vomiting, diarrhea, constipation. On Flomax, has chronic occasional urinary leaks; no other issues with urination.  No reflux symptoms.  No easy bruising or bleeding issues (no dark/tarry stools, epistaxis, BRBPR).  Walking well but not otherwise exercising.  Mom [phone]) accompanies him today (but not in room during exam).      Pertinent Post-operative Testing/History:  Post-Operative course:  His post-operative course required continued antibiotic therapy and Wound vac for his pre-existing LVAD driveline infection. Antihypertensive medications were slowly introduced post-HTx.    CMV D-/R-.  Toxo D-/R-.      Cardiac Diagnostic Testing:   07/18/13: LHC - no cardiac allograft vasculopathy (CAV)  08/06/14: LHC - no CAV   09/04/15: LHC - no CAV   08/23/16: LHC - no CAV    07/26/17: LHC - no CAV   07/19/18: DSE (Dobutamine Stress Echo) - normal  08/13/19: LHC - no CAV  10/22/20: DSE - Normal   12/14/21: LHC-  no CAV  09/15/22: DSE - normal    Echo:  07/26/12: LVEF 60-65%  08/28/12: LVEF 60-65%  12/04/12: LVEF 60-65%  01/04/13: LVEF >55%  05/14/13: LVEF 60-65%  02/12/14: LVEF 60-65%  01/17/15: LVEF 65-70%  02/10/16: LVEF 60-65%  02/01/17: LVEF 65%  01/17/18: LVEF 60-65%  07/19/18: Normal LVEF on DSE  03/25/20: LVEF 65-70%  06/02/21: LVEF 60-65%  04/20/22: LVEF 60-65%  04/26/23: LVEF 65-70%    DSA:   08/01/12: No DSA (with MFI>1000)  08/18/12: No DSA  12/04/12: No DSA  04/09/13: No DSA  02/12/14: DR16, MFI 1499  01/07/15: DR16, MFI 1692  09/04/15: DR 16, MFI 3322  02/10/16: DR16, MFI 1644  02/01/17: DR16, MFI 1495  01/17/18: No DSA  08/13/19: No DSA  03/25/20: No DSA  10/22/20: No DSA  09/15/22: No DSA      Rejection History:   08/02/2012: Mild acute cellular rejection, ISHLT 1R/2. His immunosupressive therapy was continued and Tacrolimus has been titrated with daily levels for a current goal of 10-12.   09/11/12: showed mild rejection, ISHLT 1R/2 (One small foci of infiltrate, background is clean). His Prednisone was decreased to 12.5 mg daily. Subsequent biopsies have showed no rejection and so his immunosuppression was gradually weaned as per our practice protocol.  01/05/13: Baseline Allomap 34  02/03/13: Prednisone stopped  04/09/13: Allomap off Prednisone 33       Past Medical History:  Past Medical History:   Diagnosis Date    Coronary atherosclerosis of native coronary artery 04/21/2009    12/92: CABG x 5 with LIMA to LAD and saphenous grafts to diagonal 1, obtuse marginal, distal circ and distal PDA 8/00: Repeat CABG x 3 with redo free left radial to distal LAD, saphenous to RCA, obtuse margina 2005: multiple stents placed to vein grafts and native arteries     DM (diabetes mellitus) (CMS-HCC) 12/27/2012    Hypothyroidism 05/07/2011    LVAD (left ventricular assist device) present (CMS-HCC) 12/27/2012    05/09/09 received heart mate II     Mixed hyperlipidemia 05/07/2011    Obesity 12/27/2012     Past Surgical History:   Procedure Laterality Date    HEART TRANSPLANT  07/18/12    CMV D-/R-    LEFT VENTRICULAR ASSIST DEVICE  05/09/09    PR BIOPSY OF HEART LINING N/A 09/04/2015    Procedure: Left/Right Heart Catheterization W Biopsy;  Surgeon: Lesle Reek, MD;  Location: [facility] CATH;  Service: Cardiology    PR CATH PLACE/CORON ANGIO, IMG SUPER/INTERP,R&L HRT CATH, L HRT VENTRIC N/A 08/24/2016    Procedure: Left/Right Heart Catheterization W Intervention;  Surgeon: Lesle Reek, MD;  Location: [facility] CATH;  Service: Cardiology    PR CATH PLACE/CORON ANGIO, IMG SUPER/INTERP,W LEFT HEART VENTRICULOGRAPHY N/A 07/26/2017    Procedure: Left Heart Catheterization;  Surgeon: Rosana Hoes, MD;  Location: [facility] CATH;  Service: Cardiology    PR CATH PLACE/CORON ANGIO, IMG SUPER/INTERP,W LEFT HEART VENTRICULOGRAPHY N/A 08/13/2019 Procedure: Left Heart Catheterization;  Surgeon: Marlaine Hind, MD;  Location: [facility] CATH;  Service: Cardiology    PR CATH PLACE/CORON ANGIO, IMG SUPER/INTERP,W LEFT HEART VENTRICULOGRAPHY N/A 12/14/2021    Procedure: Left Heart Catheterization;  Surgeon: Neal Dy, MD;  Location: [facility] CATH;  Service: Cardiology       Allergies: Patient has no known allergies.    Medications:   Current Outpatient Medications on File Prior to Visit   Medication Sig    amlodipine (NORVASC) 10 MG tablet Take 1 tablet (10 mg total) by mouth daily.    aspirin (ADULT LOW DOSE ASPIRIN) 81 MG tablet Take 1 tablet (81 mg total) by mouth daily.    atorvastatin (LIPITOR) 80 MG tablet Take 1 tablet (80 mg total) by mouth daily.    [EXPIRED] blood sugar diagnostic (ACCU-CHEK AVIVA PLUS TEST STRP) Strp CHECK BLOOD SUGAR BEFORE MEALS, AT BEDTIME, & AS NEEDED FOR HYPERGLYCEMIA UP TO 6 TIMES A DAY    chlorthalidone (HYGROTON) 25 MG tablet Take 1 tablet (25 mg total) by mouth daily.    cholecalciferol, vitamin D3, 1,000 unit capsule Take 3 capsules (75 mcg total) by mouth daily.    dilTIAZem (CARDIZEM CD) 180 MG 24 hr capsule Take 2 capsules (360 mg total) by mouth daily.    ezetimibe (ZETIA) 10 mg tablet Take 1 tablet (10 mg total) by mouth daily.    famotidine (PEPCID) 20 MG tablet Take 1 tablet (20 mg total) by mouth Two (2) times a day.    levothyroxine (SYNTHROID) 75 MCG tablet Take 1 tablet (75 mcg total) by mouth daily.    magnesium oxide (MAG-OX) 400 mg (241.3 mg elemental magnesium) tablet Take 1 tablet (400 mg total) by mouth two (2) times a day.    metFORMIN (GLUCOPHAGE) 1000 MG tablet Take 1 tablet (1,000 mg total) by mouth Two (2) times a day.    mycophenolate (CELLCEPT) 500 mg tablet Take 1 tablet (500 mg total) by mouth Two (2) times a day.    tacrolimus (PROGRAF) 0.5 MG capsule Take 2 capsules (1 mg total) by mouth daily AND 1 capsule (0.5 mg total) nightly.    tamsulosin (FLOMAX) 0.4 mg capsule TAKE 1 CAPSULE EVERY DAY    valsartan (DIOVAN) 160 MG tablet Take 2 tablets (320 mg total) by mouth daily.     No current facility-administered medications on file prior to visit.   NOTES  - MgOxide is 400 (not 500) QD, and is OTC gel caps which he prefers to prescribed tablets     Family History: Grandfather with CAD, heart attack 'early in life', unsure of age, died from MI age 30s. He has one son, age 59, who is healthy. He has a Engineer, maintenance (IT), born 06/2014.    Social History:   He's still living at home with his mother (age >80 years). 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 He  reports  that he quit smoking about 32 years ago. His smoking use included cigarettes. He started smoking about 52 years ago. He has a 20 pack-year smoking history. He has never used smokeless tobacco. He reports that he does not drink alcohol and does not use drugs.  Previously rare social drinking for special holiday.  No tobacco, or illicit drug use.    Review of Systems:  Rest of the review of systems is negative or unremarkable except as stated above.    Physical Exam:  There were no vitals filed for this visit.        Wt Readings from Last 12 Encounters:   09/15/22 (!) 103.6 kg (228 lb 8 oz)   07/01/22 (!) 105.4 kg (232 lb 6.4 oz)   04/20/22 (!) 105.7 kg (233 lb)   12/14/21 (!) 105.9 kg (233 lb 6.4 oz)   06/02/21 (!) 108 kg (238 lb)   10/22/20 (!) 107.6 kg (237 lb 3.2 oz)   03/25/20 (!) 108.4 kg (239 lb)   08/13/19 (!) 107.6 kg (237 lb 4.8 oz)   01/17/18 (!) 105.1 kg (231 lb 9.6 oz)   07/26/17 (!) 103.9 kg (229 lb)   02/01/17 (!) 105.3 kg (232 lb 3.2 oz)   08/24/16 (!) 101.6 kg (224 lb)    There is no height or weight on file to calculate BMI.    Constitutional: Well groomed, NAD.   HENT: Normocephalic and atraumatic.  Wearing mask, oropharynx not specifically examined  Neck: Supple without enlargements, no thyromegaly, bruit or JVD. No cervical or supraclavicular lymphadenopathy. Cardiovascular: Nondisplaced PMI, normal S1, S2, no M/G/R. Normal carotid pulses without bruits. Normal peripheral pulses.   Lungs: Chest rise symmetric.  Clear to auscultation bilaterally, without wheezes/crackles/rhonchi. Good air movement.   Skin: No rashes/breakdowns   Abdomen:  Soft, rotund, nontender, nondistended. +Large umbilical hernia noted, soft, nontender, modestly reducible, measuring approximately 6 cm x 5 cm, with scab over its RLQ - no fluid expressed.  No Hepatomegaly or other masses.   Extremities: No edema dependently bilaterally.   Musculoskeletal: No obvious joint deformity, effusions.  Psychiatry: In good spirits, normal affect.  Neurological:   Nonfocal    Labs & Imaging:  Reviewed in EPIC.   No visits with results within 1 Day(s) from this visit.   Latest known visit with results is:   Documentation on 02/25/2023   Component Date Value Ref Range Status    Tacrolimus, Trough 02/25/2023 5.1  5.0 - 20.0 mcg/L Final   EKG: tracing reviewed; see final report - NSR, normal intervals  Echo: images reviewed; see final report and above.

## 2023-04-25 ENCOUNTER — Other Ambulatory Visit: Payer: Self-pay | Admitting: Family Medicine

## 2023-04-26 ENCOUNTER — Ambulatory Visit: Admit: 2023-04-26 | Discharge: 2023-04-26 | Payer: MEDICARE

## 2023-04-26 ENCOUNTER — Ambulatory Visit
Admit: 2023-04-26 | Discharge: 2023-04-26 | Payer: MEDICARE | Attending: Cardiovascular Disease | Primary: Cardiovascular Disease

## 2023-04-26 DIAGNOSIS — T86290 Cardiac allograft vasculopathy: Principal | ICD-10-CM

## 2023-04-26 DIAGNOSIS — I255 Ischemic cardiomyopathy: Secondary | ICD-10-CM | POA: Diagnosis not present

## 2023-04-26 DIAGNOSIS — E119 Type 2 diabetes mellitus without complications: Secondary | ICD-10-CM | POA: Diagnosis not present

## 2023-04-26 DIAGNOSIS — Z79890 Hormone replacement therapy (postmenopausal): Secondary | ICD-10-CM | POA: Diagnosis not present

## 2023-04-26 DIAGNOSIS — K439 Ventral hernia without obstruction or gangrene: Secondary | ICD-10-CM | POA: Diagnosis not present

## 2023-04-26 DIAGNOSIS — Z87891 Personal history of nicotine dependence: Secondary | ICD-10-CM | POA: Diagnosis not present

## 2023-04-26 DIAGNOSIS — E038 Other specified hypothyroidism: Secondary | ICD-10-CM | POA: Diagnosis not present

## 2023-04-26 DIAGNOSIS — E7800 Pure hypercholesterolemia, unspecified: Secondary | ICD-10-CM | POA: Diagnosis not present

## 2023-04-26 DIAGNOSIS — K429 Umbilical hernia without obstruction or gangrene: Secondary | ICD-10-CM | POA: Diagnosis not present

## 2023-04-26 DIAGNOSIS — I10 Essential (primary) hypertension: Secondary | ICD-10-CM | POA: Diagnosis not present

## 2023-04-26 DIAGNOSIS — E782 Mixed hyperlipidemia: Secondary | ICD-10-CM | POA: Diagnosis not present

## 2023-04-26 DIAGNOSIS — Z6832 Body mass index (BMI) 32.0-32.9, adult: Secondary | ICD-10-CM | POA: Diagnosis not present

## 2023-04-26 DIAGNOSIS — N400 Enlarged prostate without lower urinary tract symptoms: Secondary | ICD-10-CM | POA: Diagnosis not present

## 2023-04-26 DIAGNOSIS — E1100 Type 2 diabetes mellitus with hyperosmolarity without nonketotic hyperglycemic-hyperosmolar coma (NKHHC): Secondary | ICD-10-CM | POA: Diagnosis not present

## 2023-04-26 DIAGNOSIS — Z7982 Long term (current) use of aspirin: Secondary | ICD-10-CM | POA: Diagnosis not present

## 2023-04-26 DIAGNOSIS — Z941 Heart transplant status: Secondary | ICD-10-CM | POA: Diagnosis not present

## 2023-04-26 NOTE — Unmapped (Addendum)
-   Your echo looks good    - We will plan for a Dobutamine Stress echo in 10/2022, we will do a follow up chest CT at that time    - We will see you back in 1 year with Dr.Chang    - If your hernia gets hard and you can't push it bag in then you need to notify me. You should follow up with Dr Dellis Anes for the hernia ( last visit 06/2022, with plans for you to return in 6 months)    - You are due for the RSV vaccine, and in the Fall get the Covid and Flu shot. I will call Karin Golden and get the date of your 2nd Shingrix vaccine    - The date of your Prevnar 20 was 09/15/22      Melene Muller  RN, BSN, PCCN- Heart Transplant Coordinator  [facility]  [address]  [place], [place] [address]  p [phone]

## 2023-04-27 ENCOUNTER — Encounter: Payer: Self-pay | Admitting: Cardiovascular Disease

## 2023-04-27 DIAGNOSIS — Z941 Heart transplant status: Principal | ICD-10-CM

## 2023-04-27 DIAGNOSIS — R911 Solitary pulmonary nodule: Principal | ICD-10-CM

## 2023-04-27 DIAGNOSIS — Z79899 Other long term (current) drug therapy: Principal | ICD-10-CM

## 2023-04-28 ENCOUNTER — Other Ambulatory Visit: Payer: Self-pay | Admitting: Family Medicine

## 2023-04-28 DIAGNOSIS — R7309 Other abnormal glucose: Secondary | ICD-10-CM

## 2023-04-28 DIAGNOSIS — I10 Essential (primary) hypertension: Secondary | ICD-10-CM | POA: Diagnosis not present

## 2023-04-28 DIAGNOSIS — Z941 Heart transplant status: Secondary | ICD-10-CM | POA: Diagnosis not present

## 2023-04-29 DIAGNOSIS — R338 Other retention of urine: Secondary | ICD-10-CM | POA: Diagnosis not present

## 2023-05-06 DIAGNOSIS — R338 Other retention of urine: Secondary | ICD-10-CM | POA: Diagnosis not present

## 2023-05-13 NOTE — Unmapped (Signed)
[facility] Refill Coordination Note    Specialty Medication(s) to be Shipped:   Transplant: mycophenolate mofetil 500 mg and tacrolimus 0.5 mg    Other medication(s) to be shipped:  Chlorthalidone,diltiazem,levothyroxine,amlodipine,aspirin 81,atorvastatin,famotidine,valsartan     Andrew Dickerson, DOB: Jan 11, 1956  Phone: There are no phone numbers on file.      All above HIPAA information was verified with patient.     Was a Nurse, learning disability used for this call? No    Completed refill call assessment today to schedule patient's medication shipment from the [facility] [phone]).  All relevant notes have been reviewed.     Specialty medication(s) and dose(s) confirmed: Regimen is correct and unchanged.   Changes to medications: Andrew Dickerson reports no changes at this time.  Changes to insurance: No  New side effects reported not previously addressed with a pharmacist or physician: None reported  Questions for the pharmacist: No    Confirmed patient received a Conservation officer, historic buildings and a Surveyor, mining with first shipment. The patient will receive a drug information handout for each medication shipped and additional FDA Medication Guides as required.       DISEASE/MEDICATION-SPECIFIC INFORMATION        N/A    SPECIALTY MEDICATION ADHERENCE     Medication Adherence    Patient reported X missed doses in the last month: 0  Specialty Medication: tacrolimus 0.5 MG capsule (PROGRAF)  Patient is on additional specialty medications: Yes  Additional Specialty Medications: mycophenolate 500 mg tablet (CELLCEPT)  Patient Reported Additional Medication X Missed Doses in the Last Month: 0  Patient is on more than two specialty medications: No              Were doses missed due to medication being on hold? No      tacrolimus 0.5 MG capsule (PROGRAF): 14 days of medicine on hand     mycophenolate 500 mg tablet (CELLCEPT): 14 days of medicine on hand       REFERRAL TO PHARMACIST     Referral to the pharmacist: Not needed      [facility]     Shipping address confirmed in Epic.       Delivery Scheduled: Yes, Expected medication delivery date: 05/19/2023.     Medication will be delivered via UPS to the prescription address in Epic [place].    [name] J Helane Gunther   [facility] Specialty Technician

## 2023-05-18 ENCOUNTER — Ambulatory Visit (INDEPENDENT_AMBULATORY_CARE_PROVIDER_SITE_OTHER): Payer: Medicare HMO | Admitting: Family Medicine

## 2023-05-18 ENCOUNTER — Encounter: Payer: Self-pay | Admitting: Family Medicine

## 2023-05-18 VITALS — BP 114/52 | HR 99 | Temp 97.5°F | Ht 68.0 in | Wt 206.8 lb

## 2023-05-18 DIAGNOSIS — R112 Nausea with vomiting, unspecified: Secondary | ICD-10-CM | POA: Insufficient documentation

## 2023-05-18 DIAGNOSIS — Z23 Encounter for immunization: Secondary | ICD-10-CM

## 2023-05-18 MED FILL — VALSARTAN 160 MG TABLET: ORAL | 90 days supply | Qty: 180 | Fill #2

## 2023-05-18 MED FILL — ASPIRIN 81 MG TABLET,DELAYED RELEASE: ORAL | 90 days supply | Qty: 90 | Fill #3

## 2023-05-18 MED FILL — MYCOPHENOLATE MOFETIL 500 MG TABLET: ORAL | 90 days supply | Qty: 180 | Fill #3

## 2023-05-18 MED FILL — TACROLIMUS 0.5 MG CAPSULE, IMMEDIATE-RELEASE: ORAL | 30 days supply | Qty: 90 | Fill #1

## 2023-05-18 MED FILL — CHLORTHALIDONE 25 MG TABLET: ORAL | 30 days supply | Qty: 30 | Fill #8

## 2023-05-18 MED FILL — LEVOTHYROXINE 75 MCG TABLET: ORAL | 30 days supply | Qty: 30 | Fill #8

## 2023-05-18 MED FILL — FAMOTIDINE 20 MG TABLET: ORAL | 90 days supply | Qty: 180 | Fill #3

## 2023-05-18 MED FILL — ATORVASTATIN 80 MG TABLET: ORAL | 90 days supply | Qty: 90 | Fill #1

## 2023-05-18 MED FILL — DILTIAZEM CD 180 MG CAPSULE,EXTENDED RELEASE 24 HR: ORAL | 30 days supply | Qty: 60 | Fill #8

## 2023-05-18 MED FILL — AMLODIPINE 10 MG TABLET: ORAL | 90 days supply | Qty: 90 | Fill #3

## 2023-05-18 NOTE — Assessment & Plan Note (Signed)
Vital signs are normal. Review of home blood sugars and vitals are also acceptable. These rare episodes of morning N/V are non-specific. I see no sign of an urgent or emergent issue. I recommend he try and eat regular, healthy meals and he follow-up with his PCP regarding if further work-up is warranted.

## 2023-05-18 NOTE — Progress Notes (Signed)
[facility] [address] [place] [address] Dept: [phone] Dept Fax: [phone]  Office Visit  Subjective:    Patient ID: [name], male    DOB: 1956/06/24, 67 y.o..   MRN: [id number]  Chief Complaint  Patient presents with   Nausea    Co haivng N&V off/on since 7/25, 8/25, and 9/11.     History of Present Illness:  Patient is in today complaining of three episodes of early morning nausea and vomiting over the past 2 months. He states he awakens with the feeling of nausea and then has drive heaves for a short period of time. He does have some diaphoresis when this occurs. He denies any associated chest pain, fever, abdominal pain, or change in bowel movements. Mr. Dharia has a complex medical history including Type 2 diabetes, a heart transplant, and a chronic indwelling foley catheter.  He notes he had become concerned about these episodes and was unable to get an appointment to see Dr. Durene Cal, his PCP, today.  Past Medical History: Patient Active Problem List   Diagnosis Date Noted   Immunosuppressed status (HCC) 02/23/2019   Chronic abdominal pain 12/27/2015   Heart transplanted (HCC) 06/19/2014   GERD (gastroesophageal reflux disease) 06/19/2014   Osteoarthritis 06/19/2014   BPH (benign prostatic hypertrophy) 02/26/2014   History of adenomatous polyp of colon 09/17/2013   Chronic systolic heart failure (HCC) 03/02/2009   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   AUTOMATIC IMPLANTABLE CARDIAC DEFIBRILLATOR SITU 02/26/2009   Diabetes mellitus type II, controlled (HCC) 12/13/2008   Hypothyroidism 05/14/2008   Essential hypertension 05/14/2008   ANEMIA, OTHER UNSPEC 04/03/2008   Hyperlipidemia associated with type 2 diabetes mellitus (HCC) 04/25/2007   Coronary atherosclerosis 04/25/2007   Past Surgical History:  Procedure Laterality Date   ANGIOPLASTY     with stent   CARDIAC ASSIST DEVICE REMOVAL      CARDIAC CATHETERIZATION     COLONOSCOPY     CORONARY ARTERY BYPASS GRAFT     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   LEFT VENTRICULAR ASSIST DEVICE  05/2009   [facility]   Family History  Problem Relation Age of Onset   Heart disease Mother    Lung cancer Maternal Grandmother    Lung cancer Maternal Grandfather    Colon cancer Neg Hx    Esophageal cancer Neg Hx    Prostate cancer Neg Hx    Rectal cancer Neg Hx    Stomach cancer Neg Hx    Outpatient Medications Prior to Visit  Medication Sig Dispense Refill   ACCU-CHEK GUIDE test strip USE 1  4 TIMES DAILY 100 each 0   amLODipine (NORVASC) 10 MG tablet Take 10 mg by mouth daily. Per [facility]     aspirin EC 81 MG tablet Take 1 tablet (81 mg total) by mouth daily. 30 tablet 0   atorvastatin (LIPITOR) 80 MG tablet Take 1 tablet (80 mg total) by mouth daily at 6 PM. 30 tablet 0   blood glucose meter kit and supplies KIT Dispense based on patient and insurance preference. Use up to four times daily as directed. 1 each 0   chlorthalidone (HYGROTON) 25 MG tablet Take 1 tablet (25 mg total) by mouth daily. 30 tablet 0   cholecalciferol (VITAMIN D) 1000 units tablet Take 3 tablets (3,000 Units total) by mouth daily. 90 tablet 0   diltiazem (TIAZAC) 180 MG 24 hr capsule Take 2 capsules (360 mg total) by mouth daily. 60 capsule 0  ezetimibe (ZETIA) 10 MG tablet Take 1 tablet by mouth once daily 90 tablet 0   famotidine (PEPCID) 20 MG tablet Take 20 mg by mouth in the morning and at bedtime.     levothyroxine (SYNTHROID, LEVOTHROID) 75 MCG tablet Take 1 tablet (75 mcg total) by mouth daily before breakfast. 30 tablet 0   magnesium oxide (MAG-OX) 400 MG tablet Take 1.5 tablets (600 mg total) by mouth 2 (two) times daily. 45 tablet 0   metFORMIN (GLUCOPHAGE) 1000 MG tablet TAKE 1 TABLET BY MOUTH TWICE DAILY WITH MEALS 180 tablet 0   mycophenolate (CELLCEPT) 500 MG tablet Take 1 tablet (500 mg total) by mouth 2 (two) times daily. 60 tablet 0   tacrolimus (PROGRAF) 0.5  MG capsule Take 2 capsules (1 mg total) by mouth 2 (two) times daily. (Patient taking differently: Take by mouth 2 (two) times daily. 2 tablets in the AM 1 tablet in the PM) 120 capsule 0   tamsulosin (FLOMAX) 0.4 MG CAPS capsule Take 0.4 mg by mouth.     valsartan (DIOVAN) 160 MG tablet Take 320 mg by mouth daily.      No facility-administered medications prior to visit.   No Known Allergies   Objective:   Today's Vitals   05/18/23 1622  BP: (!) 114/52  Pulse: 99  Temp: (!) 97.5 F (36.4 C)  TempSrc: Temporal  SpO2: 99%  Weight: 206 lb 12.8 oz (93.8 kg)  Height: 5\' 8"  (1.727 m)   Body mass index is 31.44 kg/m.   General: Well developed, well nourished. No acute distress. Lungs: Clear to auscultation bilaterally. No wheezing, rales or rhonchi. CV: RRR without murmurs or rubs. Pulses 2+ bilaterally. Abdomen: Soft, non-tender. Bowel sounds positive, normal pitch and frequency. No   hepatosplenomegaly. There is a alrge umbilical hernia which is reducible. No rebound or guarding. Psych: Alert and oriented. Normal mood and affect.  Health Maintenance Due  Topic Date Due   Diabetic kidney evaluation - Urine ACR  Never done   OPHTHALMOLOGY EXAM  06/20/2021   HEMOGLOBIN A1C  03/16/2023   Medicare Annual Wellness (AWV)  07/14/2023     Assessment & Plan:   Problem List Items Addressed This Visit       Digestive   Nausea and vomiting - Primary    Vital signs are normal. Review of home blood sugars and vitals are also acceptable. These rare episodes of morning N/V are non-specific. I see no sign of an urgent or emergent issue. I recommend he try and eat regular, healthy meals and he follow-up with his PCP regarding if further work-up is warranted.       Return with PCP if symptoms worsen or fail to improve.   Loyola Mast, MD

## 2023-05-20 ENCOUNTER — Encounter: Payer: Self-pay | Admitting: Family Medicine

## 2023-05-20 ENCOUNTER — Ambulatory Visit (INDEPENDENT_AMBULATORY_CARE_PROVIDER_SITE_OTHER): Payer: Medicare HMO | Admitting: Family Medicine

## 2023-05-20 VITALS — BP 90/50 | HR 95 | Temp 97.3°F | Ht 68.0 in | Wt 202.0 lb

## 2023-05-20 DIAGNOSIS — E119 Type 2 diabetes mellitus without complications: Secondary | ICD-10-CM

## 2023-05-20 DIAGNOSIS — R630 Anorexia: Secondary | ICD-10-CM

## 2023-05-20 DIAGNOSIS — Z7984 Long term (current) use of oral hypoglycemic drugs: Secondary | ICD-10-CM | POA: Diagnosis not present

## 2023-05-20 DIAGNOSIS — I5022 Chronic systolic (congestive) heart failure: Secondary | ICD-10-CM | POA: Diagnosis not present

## 2023-05-20 DIAGNOSIS — R634 Abnormal weight loss: Secondary | ICD-10-CM | POA: Diagnosis not present

## 2023-05-20 DIAGNOSIS — E034 Atrophy of thyroid (acquired): Secondary | ICD-10-CM | POA: Diagnosis not present

## 2023-05-20 LAB — CBC WITH DIFFERENTIAL/PLATELET
Basophils Absolute: 0 10*3/uL (ref 0.0–0.1)
Basophils Relative: 0.3 % (ref 0.0–3.0)
Eosinophils Absolute: 0.1 10*3/uL (ref 0.0–0.7)
Eosinophils Relative: 0.6 % (ref 0.0–5.0)
HCT: 37 % — ABNORMAL LOW (ref 39.0–52.0)
Hemoglobin: 12 g/dL — ABNORMAL LOW (ref 13.0–17.0)
Lymphocytes Relative: 7.4 % — ABNORMAL LOW (ref 12.0–46.0)
Lymphs Abs: 0.9 10*3/uL (ref 0.7–4.0)
MCHC: 32.4 g/dL (ref 30.0–36.0)
MCV: 90.5 fl (ref 78.0–100.0)
Monocytes Absolute: 0.9 10*3/uL (ref 0.1–1.0)
Monocytes Relative: 7.4 % (ref 3.0–12.0)
Neutro Abs: 10.5 10*3/uL — ABNORMAL HIGH (ref 1.4–7.7)
Neutrophils Relative %: 84.3 % — ABNORMAL HIGH (ref 43.0–77.0)
Platelets: 337 10*3/uL (ref 150.0–400.0)
RBC: 4.09 Mil/uL — ABNORMAL LOW (ref 4.22–5.81)
RDW: 13.8 % (ref 11.5–15.5)
WBC: 12.5 10*3/uL — ABNORMAL HIGH (ref 4.0–10.5)

## 2023-05-20 LAB — URINALYSIS, ROUTINE W REFLEX MICROSCOPIC
Nitrite: NEGATIVE
Specific Gravity, Urine: 1.025 (ref 1.000–1.030)
Total Protein, Urine: 100 — AB
Urine Glucose: NEGATIVE
Urobilinogen, UA: 0.2 (ref 0.0–1.0)
pH: 6 (ref 5.0–8.0)

## 2023-05-20 LAB — TSH: TSH: 2.93 u[IU]/mL (ref 0.35–5.50)

## 2023-05-20 LAB — COMPREHENSIVE METABOLIC PANEL
ALT: 17 U/L (ref 0–53)
AST: 9 U/L (ref 0–37)
Albumin: 3.9 g/dL (ref 3.5–5.2)
Alkaline Phosphatase: 87 U/L (ref 39–117)
BUN: 37 mg/dL — ABNORMAL HIGH (ref 6–23)
CO2: 18 meq/L — ABNORMAL LOW (ref 19–32)
Calcium: 9.9 mg/dL (ref 8.4–10.5)
Chloride: 101 meq/L (ref 96–112)
Creatinine, Ser: 1.78 mg/dL — ABNORMAL HIGH (ref 0.40–1.50)
GFR: 39.29 mL/min — ABNORMAL LOW (ref >60.00)
Glucose, Bld: 122 mg/dL — ABNORMAL HIGH (ref 70–99)
Potassium: 4.4 meq/L (ref 3.5–5.1)
Sodium: 133 meq/L — ABNORMAL LOW (ref 135–145)
Total Bilirubin: 1.5 mg/dL — ABNORMAL HIGH (ref 0.2–1.2)
Total Protein: 7.3 g/dL (ref 6.0–8.3)

## 2023-05-20 LAB — MICROALBUMIN / CREATININE URINE RATIO
Creatinine,U: 168.5 mg/dL
Microalb Creat Ratio: 50.4 mg/g — ABNORMAL HIGH (ref 0.0–30.0)
Microalb, Ur: 84.9 mg/dL — ABNORMAL HIGH (ref 0.0–1.9)

## 2023-05-20 LAB — C-REACTIVE PROTEIN: CRP: 11.5 mg/dL (ref 0.5–20.0)

## 2023-05-20 LAB — SEDIMENTATION RATE: Sed Rate: 93 mm/h — ABNORMAL HIGH (ref 0–20)

## 2023-05-20 LAB — HEMOGLOBIN A1C: Hgb A1c MFr Bld: 6.1 % (ref 4.6–6.5)

## 2023-05-20 NOTE — Progress Notes (Signed)
Phone [phone] In person visit   Subjective:   [name] is a 67 y.o. year old very pleasant male patient who presents for/with See problem oriented charting Chief Complaint  Patient presents with   [place]    Pt c/o persistent nausea, vomiting x 3 in the morning on 9/11. Denies fever, had chills x 1.   Past Medical History-  Patient Active Problem List   Diagnosis Date Noted   Immunosuppressed status (HCC) 02/23/2019    Priority: High   Heart transplanted (HCC) 06/19/2014    Priority: High   Chronic systolic heart failure (HCC) 03/02/2009    Priority: High   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    Priority: High   AUTOMATIC IMPLANTABLE CARDIAC DEFIBRILLATOR SITU 02/26/2009    Priority: High   Diabetes mellitus type II, controlled (HCC) 12/13/2008    Priority: High   Coronary atherosclerosis 04/25/2007    Priority: High   Chronic abdominal pain 12/27/2015    Priority: Medium    BPH (benign prostatic hypertrophy) 02/26/2014    Priority: Medium    Hypothyroidism 05/14/2008    Priority: Medium    Essential hypertension 05/14/2008    Priority: Medium    ANEMIA, OTHER UNSPEC 04/03/2008    Priority: Medium    Hyperlipidemia associated with type 2 diabetes mellitus (HCC) 04/25/2007    Priority: Medium    GERD (gastroesophageal reflux disease) 06/19/2014    Priority: Low   Osteoarthritis 06/19/2014    Priority: Low   History of adenomatous polyp of colon 09/17/2013    Priority: Low   Nausea and vomiting 05/18/2023    Medications- reviewed and updated Current Outpatient Medications  Medication Sig Dispense Refill   ACCU-CHEK GUIDE test strip USE 1  4 TIMES DAILY 100 each 0   amLODipine (NORVASC) 10 MG tablet Take 10 mg by mouth daily. Per [facility]     aspirin EC 81 MG tablet Take 1 tablet (81 mg total) by mouth daily. 30 tablet 0   atorvastatin (LIPITOR) 80 MG tablet Take 1 tablet (80 mg total) by mouth daily at 6 PM. 30 tablet 0   blood glucose meter kit and  supplies KIT Dispense based on patient and insurance preference. Use up to four times daily as directed. 1 each 0   chlorthalidone (HYGROTON) 25 MG tablet Take 1 tablet (25 mg total) by mouth daily. 30 tablet 0   cholecalciferol (VITAMIN D) 1000 units tablet Take 3 tablets (3,000 Units total) by mouth daily. 90 tablet 0   diltiazem (TIAZAC) 180 MG 24 hr capsule Take 2 capsules (360 mg total) by mouth daily. 60 capsule 0   ezetimibe (ZETIA) 10 MG tablet Take 1 tablet by mouth once daily 90 tablet 0   levothyroxine (SYNTHROID, LEVOTHROID) 75 MCG tablet Take 1 tablet (75 mcg total) by mouth daily before breakfast. 30 tablet 0   magnesium oxide (MAG-OX) 400 MG tablet Take 1.5 tablets (600 mg total) by mouth 2 (two) times daily. 45 tablet 0   metFORMIN (GLUCOPHAGE) 1000 MG tablet TAKE 1 TABLET BY MOUTH TWICE DAILY WITH MEALS 180 tablet 0   mycophenolate (CELLCEPT) 500 MG tablet Take 1 tablet (500 mg total) by mouth 2 (two) times daily. 60 tablet 0   tacrolimus (PROGRAF) 0.5 MG capsule Take 2 capsules (1 mg total) by mouth 2 (two) times daily. (Patient taking differently: Take by mouth 2 (two) times daily. 2 tablets in the AM 1 tablet in the PM) 120 capsule 0   valsartan (DIOVAN) 160  MG tablet Take 320 mg by mouth daily.      famotidine (PEPCID) 20 MG tablet Take 20 mg by mouth in the morning and at bedtime.     tamsulosin (FLOMAX) 0.4 MG CAPS capsule Take 0.4 mg by mouth. (Patient not taking: Reported on 05/20/2023)     No current facility-administered medications for this visit.     Objective:  BP (!) 90/50 (BP Location: Left Arm, Patient Position: Sitting, Cuff Size: Large)   Pulse 95   Temp (!) 97.3 F (36.3 C) (Temporal)   Ht 5\' 8"  (1.727 m)   Wt 202 lb (91.6 kg)   SpO2 98%   BMI 30.71 kg/m  Gen: NAD, resting comfortably CV: RRR no murmurs rubs or gallops Lungs: CTAB no crackles, wheeze, rhonchi Abdomen: soft/nontender/nondistended/normal bowel sounds. No rebound or guarding.  Umbilical  hernia noted but stable Ext: no edema Skin: warm, dry    Assessment and Plan    # Nausea/dry heaves on 3 separate days # Unintentional weight loss/appetite decreased for 3 months S: Patient was seen on 05/18/2023 by Dr. Veto Kemps of [place]-at that time he reported 3 episodes of early morning nausea and vomiting over the last 2 months.  Wakes up feeling nauseated and then have dry heaves for short period of time.  Has some diaphoresis along with this without chest pain, fever, abdominal pain or change in bowel movements.  Dr. Veto Kemps noted reassuring vital signs ( I asked patient today and on morning of September 11th-127/55 before meds with capillary blood glucose of 139  (his meter runs high)with heart rate 88 ), home blood sugars and vitals (which patient is always good about bringing).  That these sporadic nausea and vomiting episodes were nonspecific.  He did not note any signs of urgent or emergent issue.  He was recommended to try and eat regular healthy meals and follow-up with me to consider further workup -No abdominal pain reported.  No shortness of breath or chest pain . No edema or weight gain in regards to CHF -Occurred prior to his levothyroxine or any other medicines - Does have GERD and Pepcid works for the most part- in morning and at night- does not take before dinner.  -he does take metformin 1000 mg twice daily -feels pressure in back of head with this and pressure behind eyes most of the day for the last few months.    He reflects back to first time having watermelon and collared greens at [employer] with chunky potatoes- saw this in his vomit. Mom ate same thing but no issues.  September 10th- sausage patty (no biscuit) and slice of carrot cake.  In general appetite has dropped and not eating as much. Weight down 20 lbs from may with low appetite.  Not trying to lose weight other than reducing carbohydrates. A1c 2 months ago 2as 6.7 -he reports 04/02/23 they increased his Flomax and first  episode within a few days. He has cath right now. He went down to once a day again on tamsulosin then removed completely but no improvement.   Yesterday only had diced watermelon and half of mini bag of popcorn. Not drinking as well either   -reports stable echocardiogram in August. CXR in January 2024. October 2023 had CT abdomen that showed 1.1 cm adrenal adenoma benign- 4 mm right middle lobe pulmonary nodule- recommend 12 month follow up   -No constipation or diarrhea reported Lab Results  Component Value Date   HGBA1C 6.4 09/15/2022   HGBA1C  6.0 (A) 06/30/2022   HGBA1C 6.4 12/10/2021  A/P: With nausea and dry heaves are very intermittent but I am even more concerned about the unintentional weight loss and appetite loss.  Down 20 pounds from our May visit -Will refer to GI for their opinion -Also will embark on unintentional weight loss workup -declines HIV, Hepatitis C Virus (HCV)- low risk  -With urology cystoscopy upcoming- hold off on urine for now -With pressure in head with exertion consider an MRI of the brain or MR angiogram but has magnetic pacemaker wire her reports and was told no MRI-he even asked [facility] at last visit -I recommended trying Ensure or other protein drink but he declined -recommend ensure- he declines  -I may end up holding his metformin due to appetite suppression-update A1c  #Heart transplant/immunosuppressed-patient follows up closely with [facility] #CAD S: Medication: Remains on mycophenolate 500 mg twice daily  and tacrolimus 0.5 mg twice daily and also for CAD on atorvastatin 80 mg, Zetia 10 mg, aspirin 81 mg  A/P: Seems to have done well from heart transplant perspective as well as CAD (both appear controlled)-continue follow-up with cardiology-no chest pain or shortness of breath or edema or weight gain (in fact losing) to suggest cardiac issue   # Diabetes S: Medication:Metformin 1000 mg twice daily Lab Results  Component Value Date   HGBA1C 6.4  09/15/2022   HGBA1C 6.0 (A) 06/30/2022   HGBA1C 6.4 12/10/2021  A/P: Update A1c but strongly considering holding metformin or at least changing to extended release perhaps 500 mg twice daily   #CHF  #hypertension S: medication: Valsartan 320 mg, chlorthalidone 25 mg, amlodipine 10 mg, diltiazem 360 mg extended release Home readings #s: 143/90 but home cuff runs high about 20 points higher.  BP Readings from Last 3 Encounters:  05/20/23 (!) 90/50  05/18/23 (!) 114/52  03/23/23 130/78  A/P: Appears overcontrolled-" HOLD chlorthalidone for the moment. If home blood pressure gets above 160/90 let me know" -We did not yet remove   #hypothyroidism S: compliant On thyroid medication-levothyroxine 75 mcg Lab Results  Component Value Date   TSH 1.25 09/15/2022  A/P: Update TSH with labs as above   #BPH S: Medication: Tamsulosin 0.4 mg  A/P: Reports was increased to 0.8 mg total per day but seem to be around the time he started feeling poorly and he stopped this.  Has indwelling catheter per his report.   #GERD-Pepcid controls for the most part I do wonder if there could be an element of reflux with the symptoms  Recommended follow up: Return for as needed for new, worsening, persistent symptoms.  I would typically like to see him back in a week or 2 but I am going to be out and we opted to follow-up on labs and then make a decision Future Appointments  Date Time Provider Department Center  07/21/2023 10:20 AM Shelva Majestic, MD [facility]    Lab/Order associations:   ICD-10-CM   1. Unintentional weight loss  R63.4 CBC with Differential/Platelet    Comprehensive metabolic panel    Hemoglobin A1c    TSH    Fecal occult blood, imunochemical    DG Chest 2 View    Sedimentation rate    C-reactive protein    Urinalysis, Routine w reflex microscopic    Ambulatory referral to Gastroenterology    2. Loss of appetite  R63.0 Ambulatory referral to Gastroenterology    3. Controlled  type 2 diabetes mellitus without complication, without long-term current  use of insulin (HCC)  E11.9 Hemoglobin A1c    Microalbumin / creatinine urine ratio    4. Chronic systolic heart failure (HCC)  V42.59     5. Hypothyroidism due to acquired atrophy of thyroid  E03.4       No orders of the defined types were placed in this encounter.   Return precautions advised.  Tana Conch, MD

## 2023-05-20 NOTE — Patient Instructions (Addendum)
  Please stop by lab before you go If you have mychart- we will send your results within 3 business days of [place] receiving them.  If you do not have mychart- we will call you about results within 5 business days of [place] receiving them.  *please also note that you will see labs on mychart as soon as they post. I will later go in and write notes on them- will say "notes from Dr. Durene Cal"   Please go to [place]  [facility] - located [address] across the street from [place] - in the basement - Hours: 8:30-5:00 PM M-F (with lunch from 12:30- 1 PM). You do NOT need an appointment.    We have placed a referral for you today to Dr. Myrtie Neither of GI. In some cases you will see # listed below- you can call this if you have not heard within a week. If you do not see # listed- you should receive a mychart message or phone call within a week with the # to call directly- call that as soon as you get it. If you are having issues getting scheduled reach out to [place] again.   HOLD chlorthalidone for the moment. If home blood pressure gets above 160/90 let me know  Recommended follow up: Return for as needed for new, worsening, persistent symptoms.

## 2023-05-23 ENCOUNTER — Ambulatory Visit
Admission: RE | Admit: 2023-05-23 | Discharge: 2023-05-23 | Disposition: A | Discharge status: Home / Self Care | Payer: Medicare HMO | Source: Ambulatory Visit | Attending: Family Medicine | Admitting: Family Medicine

## 2023-05-23 ENCOUNTER — Other Ambulatory Visit: Payer: Self-pay

## 2023-05-23 DIAGNOSIS — R300 Dysuria: Secondary | ICD-10-CM

## 2023-05-23 DIAGNOSIS — R634 Abnormal weight loss: Secondary | ICD-10-CM | POA: Diagnosis not present

## 2023-05-23 NOTE — Unmapped (Signed)
Received message from patient requesting I call him when I got to the office Monday. Called patient and he was feeling better, he was seen by his PCP on Friday for 2 episodes of vomiting. During his visit, his BP was noted to be in the 90's, his PCP stopped his valsartan, chlorthalidone and Metformin. He has follow up with PCP in November. He plans to have repeat lab work this week. He also reports in July his Flomax was increased to 0.4 mg BID. When he increased, it made him feel unwell, so he stopped Flomax all together. He resumed it just recently but is only taking one time per day.   Patient checks BP daily at home. Advised he should continue checking and to call me if his BP becomes elevated, consistently 140/90.

## 2023-05-24 DIAGNOSIS — Z48298 Encounter for aftercare following other organ transplant: Secondary | ICD-10-CM | POA: Diagnosis not present

## 2023-05-24 DIAGNOSIS — Z79899 Other long term (current) drug therapy: Secondary | ICD-10-CM | POA: Diagnosis not present

## 2023-05-24 DIAGNOSIS — Z941 Heart transplant status: Secondary | ICD-10-CM | POA: Diagnosis not present

## 2023-05-24 DIAGNOSIS — E559 Vitamin D deficiency, unspecified: Secondary | ICD-10-CM | POA: Diagnosis not present

## 2023-05-24 DIAGNOSIS — R7989 Other specified abnormal findings of blood chemistry: Secondary | ICD-10-CM | POA: Diagnosis not present

## 2023-05-25 ENCOUNTER — Other Ambulatory Visit (INDEPENDENT_AMBULATORY_CARE_PROVIDER_SITE_OTHER): Payer: Medicare HMO

## 2023-05-25 DIAGNOSIS — R300 Dysuria: Secondary | ICD-10-CM | POA: Diagnosis not present

## 2023-05-26 LAB — CBC W/ DIFFERENTIAL
BASOPHILS ABSOLUTE COUNT: 53 {cells}/uL (ref 0–200)
BASOPHILS RELATIVE PERCENT: 0.5 %
EOSINOPHILS ABSOLUTE COUNT: 85 {cells}/uL (ref 15–500)
EOSINOPHILS RELATIVE PERCENT: 0.8 %
HEMATOCRIT: 34.9 % — ABNORMAL LOW (ref 38.5–50.0)
HEMOGLOBIN: 11.5 g/dL — ABNORMAL LOW (ref 13.2–17.1)
LYMPHOCYTES ABSOLUTE COUNT: 1071 {cells}/uL (ref 850–3900)
LYMPHOCYTES RELATIVE PERCENT: 10.1 %
MEAN CORPUSCULAR HEMOGLOBIN CONC: 33 g/dL (ref 32.0–36.0)
MEAN CORPUSCULAR HEMOGLOBIN: 29.6 pg (ref 27.0–33.0)
MEAN CORPUSCULAR VOLUME: 89.7 fL (ref 80.0–100.0)
MEAN PLATELET VOLUME: 10.3 fL (ref 7.5–12.5)
MONOCYTES ABSOLUTE COUNT: 647 {cells}/uL (ref 200–950)
MONOCYTES RELATIVE PERCENT: 6.1 %
NEUTROPHILS ABSOLUTE COUNT: 8745 {cells}/uL — ABNORMAL HIGH (ref 1500–7800)
NEUTROPHILS RELATIVE PERCENT: 82.5 %
PLATELET COUNT: 362 10*3/uL (ref 140–400)
RED BLOOD CELL COUNT: 3.89 10*6/uL — ABNORMAL LOW (ref 4.20–5.80)
RED CELL DISTRIBUTION WIDTH: 12.3 % (ref 11.0–15.0)
WHITE BLOOD CELL COUNT: 10.6 10*3/uL (ref 3.8–10.8)

## 2023-05-26 LAB — BASIC METABOLIC PANEL
BLOOD UREA NITROGEN: 28 mg/dL — ABNORMAL HIGH (ref 7–25)
BUN / CREAT RATIO: 21 (calc) (ref 6–22)
CALCIUM: 9.8 mg/dL (ref 8.6–10.3)
CHLORIDE: 102 mmol/L (ref 98–110)
CO2: 20 mmol/L (ref 20–32)
CREATININE: 1.35 mg/dL (ref 0.70–1.35)
EGFR CKD-EPI NON-AA MALE: 58 mL/min/{1.73_m2} — ABNORMAL LOW
GLUCOSE RANDOM: 147 mg/dL — ABNORMAL HIGH (ref 65–99)
POTASSIUM: 4.5 mmol/L (ref 3.5–5.3)
SODIUM: 135 mmol/L (ref 135–146)

## 2023-05-26 LAB — MAGNESIUM: MAGNESIUM: 1.6 mg/dL (ref 1.5–2.5)

## 2023-05-26 LAB — VITAMIN D 25 HYDROXY: VITAMIN D, TOTAL (25OH): 57 ng/mL (ref 30–100)

## 2023-05-26 LAB — TACROLIMUS LEVEL, TROUGH: TACROLIMUS, TROUGH: 6 ug/L

## 2023-05-26 LAB — URINE CULTURE
MICRO NUMBER:: 15483816
SPECIMEN QUALITY:: ADEQUATE

## 2023-05-27 ENCOUNTER — Encounter: Payer: Self-pay | Admitting: Family Medicine

## 2023-05-27 DIAGNOSIS — N401 Enlarged prostate with lower urinary tract symptoms: Secondary | ICD-10-CM | POA: Diagnosis not present

## 2023-05-27 DIAGNOSIS — R3914 Feeling of incomplete bladder emptying: Secondary | ICD-10-CM | POA: Diagnosis not present

## 2023-05-27 DIAGNOSIS — R338 Other retention of urine: Secondary | ICD-10-CM | POA: Diagnosis not present

## 2023-05-27 NOTE — Unmapped (Signed)
Discussed recent labs with [facility], CPP.  Plan is to Make No Changes  with repeat labs in 2 Weeks.    Andrew Dickerson verbalized understanding & agreed with the plan.    Lab Results   Component Value Date    TACROLIMUS 6.0 05/24/2023     Goal: Tac: 5-8  Current Dose: Tacrolimus 1 mg/0.5 mg    Lab Results   Component Value Date    BUN 28 (H) 05/24/2023    CREATININE 1.35 05/24/2023    K 4.5 05/24/2023    GLU 147 (H) 05/24/2023    MG 1.6 05/24/2023     Lab Results   Component Value Date    WBC 10.6 05/24/2023    HGB 11.5 (L) 05/24/2023    HCT 34.9 (L) 05/24/2023    PLT 362 05/24/2023    NEUTROABS 8,745 (H) 05/24/2023    EOSABS 85 05/24/2023

## 2023-05-30 ENCOUNTER — Other Ambulatory Visit: Payer: Self-pay | Admitting: Urology

## 2023-05-30 NOTE — Unmapped (Signed)
Cardiac clearance faxed from [facility] urology for patient for transurethral resection of prostate. Forms placed in transplant coordinators folder and [place] alerted for completion

## 2023-05-31 ENCOUNTER — Encounter: Payer: Self-pay | Admitting: Family Medicine

## 2023-06-02 NOTE — Unmapped (Signed)
Received surgical clearance form for patient to have prostatectomy next month. Clearance form completed.

## 2023-06-10 DIAGNOSIS — R7989 Other specified abnormal findings of blood chemistry: Secondary | ICD-10-CM | POA: Diagnosis not present

## 2023-06-10 DIAGNOSIS — Z941 Heart transplant status: Secondary | ICD-10-CM | POA: Diagnosis not present

## 2023-06-10 DIAGNOSIS — Z79899 Other long term (current) drug therapy: Secondary | ICD-10-CM | POA: Diagnosis not present

## 2023-06-10 DIAGNOSIS — Z48298 Encounter for aftercare following other organ transplant: Secondary | ICD-10-CM | POA: Diagnosis not present

## 2023-06-12 NOTE — Progress Notes (Addendum)
Anesthesia Review:  PCP: Tana Conch- [facility] 05/20/23  Cardiologist :  Joya Gaskins- [facility] 04/26/2023  Chest x-ray : 09/15/2022- Care Everywhere  EKG : 04/26/2023- Care Everywhere - requested by fax .    Echo : 04/26/2023 Care Everywhere  Stress test: Cardiac Cath :  Activity level: can do a flight of stairs without difficutly  Sleep Study/ CPAP : none  Fasting Blood Sugar :      / Checks Blood Sugar -- times a day:   Blood Thinner/ Instructions /Last Dose: ASA / Instructions/ Last Dose :  81 mg aspirin    DM- type 2- currently  on no meds for approx a month due to meds affecting kidneys.   Checks glucose every am per pt    Hgba1c- 05/20/23- 6.1   LVAD- 2010  Heart Transplant - 2013    PT was 20 minutes late for lab appt.     PT came in to preop today and complained fo right lower leg primarily ankle and foot with pain that started on 06/11/2023 and ahs progressed to where pt cannot walk .  Ankle and foot increased edema greater than left.  Jessica WArd, PAC assessed leg.  PT given options of calling PCP or [facility]gent Care.  With assistance of preop nurse pt has appt with [facility] with [facility] at 500pm today.  PT aware to keep DR PAce aware of right leg status.  PT voiced understanding.   Mother with pt at preop appt.   PT did got to [facility]gent Care on 06/13/2023 for right ankle pain.    Called [facility] to obtain most recent lab results and they faxed a form for me to complete and fax back which was done.  Awaiting most recent lab results.  Fax confirmation received.   CBC and CMP done 05/20/23- in epic.   Labs don 05/24/23- BMp and CBC/Diff on chart from [facility]  06/10/23- BMP on chart and CBC/Diff on chart from [facility]

## 2023-06-12 NOTE — Patient Instructions (Signed)
SURGICAL WAITING ROOM VISITATION  Patients having surgery or a procedure may have no more than 2 support people in the waiting area - these visitors may rotate.    Children under the age of 60 must have an adult with them who is not the patient.  Due to an increase in RSV and influenza rates and associated hospitalizations, children ages 22 and under may not visit patients in [facility] hospitals.  If the patient needs to stay at the hospital during part of their recovery, the visitor guidelines for inpatient rooms apply. Pre-op nurse will coordinate an appropriate time for 1 support person to accompany patient in pre-op.  This support person may not rotate.    Please refer to the [facility] website for the visitor guidelines for Inpatients (after your surgery is over and you are in a regular room).       Your procedure is scheduled on:  06/21/2023    Report to [facility] Main Entrance    Report to admitting at    0830AM   Call this number if you have problems the morning of surgery [phone]   Do not eat food  or drink liquids :After Midnight.                 If you have questions, please contact your surgeon's office.      Oral Hygiene is also important to reduce your risk of infection.                                    Remember - BRUSH YOUR TEETH THE MORNING OF SURGERY WITH YOUR REGULAR TOOTHPASTE  DENTURES WILL BE REMOVED PRIOR TO SURGERY PLEASE DO NOT APPLY "Poly grip" OR ADHESIVES!!!   Do NOT smoke after Midnight   Stop all vitamins and herbal supplements 7 days before surgery.   Take these medicines the morning of surgery with A SIP OF WATER:  amlodiopine, pepcid, synthroid, cellcept , prograf   DO NOT TAKE ANY ORAL DIABETIC MEDICATIONS DAY OF YOUR SURGERY  Bring CPAP mask and tubing day of surgery.                              You may not have any metal on your body including hair pins, jewelry, and body piercing             Do not wear  make-up, lotions, powders, perfumes/cologne, or deodorant  Do not wear nail polish including gel and S&S, artificial/acrylic nails, or any other type of covering on natural nails including finger and toenails. If you have artificial nails, gel coating, etc. that needs to be removed by a nail salon please have this removed prior to surgery or surgery may need to be canceled/ delayed if the surgeon/ anesthesia feels like they are unable to be safely monitored.   Do not shave  48 hours prior to surgery.               Men may shave face and neck.   Do not bring valuables to the hospital. [place] [facility] IS NOT             RESPONSIBLE   FOR VALUABLES.   Contacts, glasses, dentures or bridgework may not be worn into surgery.   Bring small overnight bag day of surgery.   DO NOT [facility]  MEDICATIONS TO THE HOSPITAL. PHARMACY WILL DISPENSE MEDICATIONS LISTED ON YOUR MEDICATION LIST TO YOU DURING YOUR ADMISSION IN THE HOSPITAL!    Patients discharged on the day of surgery will not be allowed to drive home.  Someone NEEDS to stay with you for the first 24 hours after anesthesia.   Special Instructions: Bring a copy of your healthcare power of attorney and living will documents the day of surgery if you haven't scanned them before.              Please read over the following fact sheets you were given: IF YOU HAVE QUESTIONS ABOUT YOUR PRE-OP INSTRUCTIONS PLEASE CALL [phone]   If you received a COVID test during your pre-op visit  it is requested that you wear a mask when out in public, stay away from anyone that may not be feeling well and notify your surgeon if you develop symptoms. If you test positive for Covid or have been in contact with anyone that has tested positive in the last 10 days please notify you surgeon.    [facility] - Preparing for Surgery Before surgery, you can play an important role.  Because skin is not sterile, your skin needs to be as free of germs as possible.  You  can reduce the number of germs on your skin by washing with CHG (chlorahexidine gluconate) soap before surgery.  CHG is an antiseptic cleaner which kills germs and bonds with the skin to continue killing germs even after washing. Please DO NOT use if you have an allergy to CHG or antibacterial soaps.  If your skin becomes reddened/irritated stop using the CHG and inform your nurse when you arrive at Short Stay. Do not shave (including legs and underarms) for at least 48 hours prior to the first CHG shower.  You may shave your face/neck. Please follow these instructions carefully:  1.  Shower with CHG Soap the night before surgery and the  morning of Surgery.  2.  If you choose to wash your hair, wash your hair first as usual with your  normal  shampoo.  3.  After you shampoo, rinse your hair and body thoroughly to remove the  shampoo.                           4.  Use CHG as you would any other liquid soap.  You can apply chg directly  to the skin and wash                       Gently with a scrungie or clean washcloth.  5.  Apply the CHG Soap to your body ONLY FROM THE NECK DOWN.   Do not use on face/ open                           Wound or open sores. Avoid contact with eyes, ears mouth and genitals (private parts).                       Wash face,  Genitals (private parts) with your normal soap.             6.  Wash thoroughly, paying special attention to the area where your surgery  will be performed.  7.  Thoroughly rinse your body with warm water from the neck down.  8.  DO NOT shower/wash with  your normal soap after using and rinsing off  the CHG Soap.                9.  Pat yourself dry with a clean towel.            10.  Wear clean pajamas.            11.  Place clean sheets on your bed the night of your first shower and do not  sleep with pets. Day of Surgery : Do not apply any lotions/deodorants the morning of surgery.  Please wear clean clothes to the hospital/surgery center.  FAILURE  TO FOLLOW THESE INSTRUCTIONS MAY RESULT IN THE CANCELLATION OF YOUR SURGERY PATIENT SIGNATURE_________________________________  NURSE SIGNATURE__________________________________  ________________________________________________________________________

## 2023-06-13 ENCOUNTER — Encounter (HOSPITAL_COMMUNITY)
Admission: RE | Admit: 2023-06-13 | Discharge: 2023-06-13 | Disposition: A | Discharge status: Home / Self Care | Payer: Medicare HMO | Source: Ambulatory Visit | Attending: Urology | Admitting: Urology

## 2023-06-13 ENCOUNTER — Encounter (HOSPITAL_COMMUNITY): Payer: Self-pay

## 2023-06-13 ENCOUNTER — Encounter (HOSPITAL_COMMUNITY): Payer: Self-pay | Admitting: Emergency Medicine

## 2023-06-13 ENCOUNTER — Ambulatory Visit (INDEPENDENT_AMBULATORY_CARE_PROVIDER_SITE_OTHER): Payer: Medicare HMO

## 2023-06-13 ENCOUNTER — Other Ambulatory Visit: Payer: Self-pay

## 2023-06-13 ENCOUNTER — Ambulatory Visit: Payer: Self-pay

## 2023-06-13 ENCOUNTER — Ambulatory Visit (HOSPITAL_COMMUNITY)
Admission: EM | Admit: 2023-06-13 | Discharge: 2023-06-13 | Disposition: A | Discharge status: Home / Self Care | Payer: Medicare HMO | Attending: Internal Medicine | Admitting: Internal Medicine

## 2023-06-13 VITALS — BP 154/81 | HR 88 | Temp 98.4°F | Resp 16 | Ht 68.0 in

## 2023-06-13 DIAGNOSIS — M25571 Pain in right ankle and joints of right foot: Secondary | ICD-10-CM | POA: Diagnosis not present

## 2023-06-13 DIAGNOSIS — Z01812 Encounter for preprocedural laboratory examination: Secondary | ICD-10-CM | POA: Insufficient documentation

## 2023-06-13 DIAGNOSIS — M25471 Effusion, right ankle: Secondary | ICD-10-CM | POA: Diagnosis not present

## 2023-06-13 DIAGNOSIS — Z01818 Encounter for other preprocedural examination: Secondary | ICD-10-CM

## 2023-06-13 DIAGNOSIS — M25474 Effusion, right foot: Secondary | ICD-10-CM | POA: Diagnosis not present

## 2023-06-13 DIAGNOSIS — M79671 Pain in right foot: Secondary | ICD-10-CM | POA: Diagnosis not present

## 2023-06-13 HISTORY — DX: Chronic kidney disease, unspecified: N18.9

## 2023-06-13 HISTORY — DX: Hypothyroidism, unspecified: E03.9

## 2023-06-13 LAB — GLUCOSE, CAPILLARY: Glucose-Capillary: 133 mg/dL — ABNORMAL HIGH (ref 70–99)

## 2023-06-13 LAB — SURGICAL PCR SCREEN
MRSA, PCR: NEGATIVE
Staphylococcus aureus: NEGATIVE

## 2023-06-13 MED ORDER — HYDROCODONE-ACETAMINOPHEN 5-325 MG PO TABS
1.0000 | ORAL_TABLET | Freq: Four times a day (QID) | ORAL | 0 refills | Status: DC | PRN
Start: 2023-06-13 — End: 2023-06-21

## 2023-06-13 MED ORDER — PREDNISONE 20 MG PO TABS: 40.0000 mg | ORAL_TABLET | Freq: Every day | ORAL | 0 refills | Status: AC

## 2023-06-13 NOTE — Discharge Instructions (Addendum)
Apply a compressive ACE bandage.  Rest and elevate the affected painful area. Please take medications as prescribed Apply cold compresses intermittently as needed.   As pain recedes, begin normal activities slowly as tolerated.   Return to urgent care if symptoms persist.

## 2023-06-13 NOTE — ED Triage Notes (Signed)
Pt started have pain in right ankle and heel Saturday. Today pain and swelling are much worse. Pt denies any know injury to foot.

## 2023-06-13 NOTE — ED Provider Notes (Signed)
[facility]    CSN: [id number] Arrival date & time: 06/13/23  1701      History   Chief Complaint No chief complaint on file.   HPI [name] is a 67 y.o. male.   HPI  Past Medical History:  Diagnosis Date   Abdominal pain, epigastric 03/31/2009   Acute on chronic systolic heart failure (HCC) 03/02/2009   ANEMIA, OTHER UNSPEC 04/03/2008   AUTOMATIC IMPLANTABLE CARDIAC DEFIBRILLATOR SITU 02/26/2009   on old heart   Blood transfusion without reported diagnosis    CHF (congestive heart failure) (HCC)    Chronic kidney disease    CORONARY ARTERY DISEASE 04/25/2007   DIABETES MELLITUS, TYPE II 12/13/2008   no meds, diet contolled   GERD (gastroesophageal reflux disease)    HYPERLIPIDEMIA 04/25/2007   Hypertension    HYPERTENSION, HX OF 05/14/2008   Hypothyroidism    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   MYOCARDIAL INFARCTION, 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   OBESITY 02/26/2009   Thyroid disease    THYROID DISEASE, HX OF 05/14/2008   VENTRICULAR TACHYCARDIA 02/26/2009    Patient Active Problem List   Diagnosis Date Noted   Nausea and vomiting 05/18/2023   Immunosuppressed status (HCC) 02/23/2019   Chronic abdominal pain 12/27/2015   Heart transplanted (HCC) 06/19/2014   GERD (gastroesophageal reflux disease) 06/19/2014   Osteoarthritis 06/19/2014   Benign prostatic hyperplasia 02/26/2014   History of adenomatous polyp of colon 09/17/2013   Chronic systolic heart failure (HCC) 03/02/2009   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   AUTOMATIC IMPLANTABLE CARDIAC DEFIBRILLATOR SITU 02/26/2009   Diabetes mellitus type II, controlled (HCC) 12/13/2008   Hypothyroidism 05/14/2008   Essential hypertension 05/14/2008   ANEMIA, OTHER UNSPEC 04/03/2008   Hyperlipidemia associated with type 2 diabetes mellitus (HCC) 04/25/2007   Coronary atherosclerosis 04/25/2007    Past Surgical History:  Procedure Laterality Date   ANGIOPLASTY     with stent   CARDIAC ASSIST DEVICE  REMOVAL     CARDIAC CATHETERIZATION     COLONOSCOPY     CORONARY ARTERY BYPASS GRAFT     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   LEFT VENTRICULAR ASSIST DEVICE  05/2009   [facility]       Home Medications    Prior to Admission medications   Medication Sig Start Date End Date Taking? Authorizing Provider  ACCU-CHEK GUIDE test strip USE 1  4 TIMES DAILY 03/22/23   Shelva Majestic, MD  amLODipine (NORVASC) 10 MG tablet Take 10 mg by mouth in the morning.    [provider]  aspirin EC 81 MG tablet Take 1 tablet (81 mg total) by mouth daily. 01/25/17   Shelva Majestic, MD  atorvastatin (LIPITOR) 80 MG tablet Take 1 tablet (80 mg total) by mouth daily at 6 PM. 01/25/17   Shelva Majestic, MD  blood glucose meter kit and supplies KIT Dispense based on patient and insurance preference. Use up to four times daily as directed. 06/30/22   Shelva Majestic, MD  cholecalciferol (VITAMIN D) 1000 units tablet Take 3 tablets (3,000 Units total) by mouth daily. 01/25/17   Shelva Majestic, MD  diltiazem [facility]) 180 MG 24 hr capsule Take 2 capsules (360 mg total) by mouth daily. Patient taking differently: Take 360 mg by mouth every evening. 01/25/17   Shelva Majestic, MD  doxycycline (VIBRA-TABS) 100 MG tablet Take 100 mg by mouth 2 (two) times daily.    [provider]  ezetimibe (ZETIA) 10 MG tablet Take  1 tablet by mouth once daily 04/25/23   Shelva Majestic, MD  famotidine (PEPCID) 20 MG tablet Take 20 mg by mouth in the morning and at bedtime. 09/03/19 06/01/27  [provider]  levothyroxine (SYNTHROID, LEVOTHROID) 75 MCG tablet Take 1 tablet (75 mcg total) by mouth daily before breakfast. 01/25/17   Shelva Majestic, MD  magnesium oxide (MAG-OX) 400 (240 Mg) MG tablet Take 400 mg by mouth in the morning.    [provider]  magnesium oxide (MAG-OX) 400 MG tablet Take 1.5 tablets (600 mg total) by mouth 2 (two) times daily. Patient taking differently: Take 400 mg by mouth  daily. 01/25/17   Shelva Majestic, MD  mycophenolate (CELLCEPT) 500 MG tablet Take 1 tablet (500 mg total) by mouth 2 (two) times daily. 01/25/17   Shelva Majestic, MD  tacrolimus (PROGRAF) 0.5 MG capsule Take 2 capsules (1 mg total) by mouth 2 (two) times daily. Patient taking differently: Take 0.5-1 mg by mouth See admin instructions. Take 2 capsules (1 mg) by mouth in the morning & take 1 capsule (0.5 mg) by mouth in the evening. 01/25/17   Shelva Majestic, MD    Family History Family History  Problem Relation Age of Onset   Heart disease Mother    Lung cancer Maternal Grandmother    Lung cancer Maternal Grandfather    Colon cancer Neg Hx    Esophageal cancer Neg Hx    Prostate cancer Neg Hx    Rectal cancer Neg Hx    Stomach cancer Neg Hx     Social History Social History   Tobacco Use   Smoking status: Former    Current packs/day: 0.00    Average packs/day: 1 pack/day for 20.0 years (20.0 ttl pk-yrs)    Types: Cigarettes    Start date: 07/02/1979    Quit date: 07/02/1999    Years since quitting: 23.9   Smokeless tobacco: Never  Vaping Use   Vaping status: Never Used  Substance Use Topics   Alcohol use: No    Comment: once yearly   Drug use: No     Allergies   Patient has no known allergies.   Review of Systems Review of Systems   Physical Exam Triage Vital Signs ED Triage Vitals  Encounter Vitals Group     BP 06/13/23 1829 (!) 151/75     Systolic BP Percentile --      Diastolic BP Percentile --      Pulse Rate 06/13/23 1829 91     Resp 06/13/23 1829 18     Temp 06/13/23 1829 98.3 F (36.8 C)     Temp Source 06/13/23 1829 Oral     SpO2 06/13/23 1829 96 %     Weight --      Height --      Head Circumference --      Peak Flow --      Pain Score 06/13/23 1827 10     Pain Loc --      Pain Education --      Exclude from Growth Chart --    No data found.  Updated Vital Signs BP (!) 151/75 (BP Location: Right Arm)   Pulse 91   Temp 98.3 F  (36.8 C) (Oral)   Resp 18   SpO2 96%   Visual Acuity Right Eye Distance:   Left Eye Distance:   Bilateral Distance:    Right Eye Near:   Left Eye Near:  Bilateral Near:     Physical Exam   UC Treatments / Results  Labs (all labs ordered are listed, but only abnormal results are displayed) Labs Reviewed - No data to display  EKG   Radiology DG Foot Complete Right  Result Date: 06/13/2023 CLINICAL DATA:  Pain after injury. EXAM: RIGHT FOOT COMPLETE - 3+ VIEW; RIGHT ANKLE - COMPLETE 3+ VIEW COMPARISON:  None Available. FINDINGS: Ankle: No acute fracture or dislocation. The ankle mortise is preserved. Intact talar dome. Small joint effusion. There is generalized soft tissue edema. 2-3 mm pelvic density in the medial soft tissues may be a surgical clip or foreign body. Moderate plantar calcaneal spur and Achilles tendon enthesophyte. Foot: There is no evidence of fracture or dislocation. Mild degenerative change of the first metatarsal phalangeal joint. Lesser degenerative change of the digits. Generalized soft tissue edema. IMPRESSION: 1. No acute fracture or dislocation of the right ankle or foot. 2. Soft tissue edema and small joint effusion. 3. A 2-3 mm density in the medial soft tissues of the ankle may be a surgical clip or foreign body. Electronically Signed   By: Narda Rutherford M.D.   On: 06/13/2023 19:36   DG Ankle Complete Right  Result Date: 06/13/2023 CLINICAL DATA:  Pain after injury. EXAM: RIGHT FOOT COMPLETE - 3+ VIEW; RIGHT ANKLE - COMPLETE 3+ VIEW COMPARISON:  None Available. FINDINGS: Ankle: No acute fracture or dislocation. The ankle mortise is preserved. Intact talar dome. Small joint effusion. There is generalized soft tissue edema. 2-3 mm pelvic density in the medial soft tissues may be a surgical clip or foreign body. Moderate plantar calcaneal spur and Achilles tendon enthesophyte. Foot: There is no evidence of fracture or dislocation. Mild degenerative change  of the first metatarsal phalangeal joint. Lesser degenerative change of the digits. Generalized soft tissue edema. IMPRESSION: 1. No acute fracture or dislocation of the right ankle or foot. 2. Soft tissue edema and small joint effusion. 3. A 2-3 mm density in the medial soft tissues of the ankle may be a surgical clip or foreign body. Electronically Signed   By: Narda Rutherford M.D.   On: 06/13/2023 19:36    Procedures Procedures (including critical care time)  Medications Ordered in UC Medications - No data to display  Initial Impression / Assessment and Plan / UC Course  I have reviewed the triage vital signs and the nursing notes.  Pertinent labs & imaging results that were available during my care of the patient were reviewed by me and considered in my medical decision making (see chart for details).     *** Final Clinical Impressions(s) / UC Diagnoses   Final diagnoses:  None   Discharge Instructions   None    ED Prescriptions   None    PDMP not reviewed this encounter.

## 2023-06-14 LAB — CBC W/ DIFFERENTIAL
BASOPHILS ABSOLUTE COUNT: 39 {cells}/uL (ref 0–200)
BASOPHILS RELATIVE PERCENT: 0.5 %
EOSINOPHILS ABSOLUTE COUNT: 92 {cells}/uL (ref 15–500)
EOSINOPHILS RELATIVE PERCENT: 1.2 %
HEMATOCRIT: 36.6 % — ABNORMAL LOW (ref 38.5–50.0)
HEMOGLOBIN: 11.9 g/dL — ABNORMAL LOW (ref 13.2–17.1)
LYMPHOCYTES ABSOLUTE COUNT: 1263 {cells}/uL (ref 850–3900)
LYMPHOCYTES RELATIVE PERCENT: 16.4 %
MEAN CORPUSCULAR HEMOGLOBIN CONC: 32.5 g/dL (ref 32.0–36.0)
MEAN CORPUSCULAR HEMOGLOBIN: 29.2 pg (ref 27.0–33.0)
MEAN CORPUSCULAR VOLUME: 89.7 fL (ref 80.0–100.0)
MEAN PLATELET VOLUME: 10 fL (ref 7.5–12.5)
MONOCYTES ABSOLUTE COUNT: 431 {cells}/uL (ref 200–950)
MONOCYTES RELATIVE PERCENT: 5.6 %
NEUTROPHILS ABSOLUTE COUNT: 5875 {cells}/uL (ref 1500–7800)
NEUTROPHILS RELATIVE PERCENT: 76.3 %
PLATELET COUNT: 273 10*3/uL (ref 140–400)
RED BLOOD CELL COUNT: 4.08 10*6/uL — ABNORMAL LOW (ref 4.20–5.80)
RED CELL DISTRIBUTION WIDTH: 12.3 % (ref 11.0–15.0)
WHITE BLOOD CELL COUNT: 7.7 10*3/uL (ref 3.8–10.8)

## 2023-06-14 LAB — BASIC METABOLIC PANEL
BLOOD UREA NITROGEN: 23 mg/dL (ref 7–25)
CALCIUM: 9.3 mg/dL (ref 8.6–10.3)
CHLORIDE: 106 mmol/L (ref 98–110)
CO2: 21 mmol/L (ref 20–32)
CREATININE: 1.33 mg/dL (ref 0.70–1.35)
EGFR CKD-EPI AA MALE: 59 mL/min/{1.73_m2} — ABNORMAL LOW
GLUCOSE RANDOM: 132 mg/dL — ABNORMAL HIGH (ref 65–99)
POTASSIUM: 4.7 mmol/L (ref 3.5–5.3)
SODIUM: 137 mmol/L (ref 135–146)

## 2023-06-14 LAB — MAGNESIUM: MAGNESIUM: 1.3 mg/dL — ABNORMAL LOW (ref 1.5–2.5)

## 2023-06-14 LAB — SIROLIMUS LEVEL: SIROLIMUS LEVEL BLOOD: <1 ug/L — ABNORMAL LOW (ref 3.0–18.0)

## 2023-06-14 LAB — TACROLIMUS LEVEL, TROUGH: TACROLIMUS, TROUGH: 5.6 ug/L (ref 5.0–20.0)

## 2023-06-15 ENCOUNTER — Telehealth: Payer: Self-pay | Admitting: Family Medicine

## 2023-06-15 ENCOUNTER — Encounter (HOSPITAL_COMMUNITY): Payer: Self-pay | Admitting: Urology

## 2023-06-15 NOTE — Telephone Encounter (Signed)
Patient is requesting a call from someone at the office/Dr [facility] with a question he has regarding his Urgent care visit yesterday, foot pain, by Sunday in wheelchair unable to walk.   Thank you Gabriel Cirri [facility] AWV TEAM Direct Dial [phone]

## 2023-06-15 NOTE — Progress Notes (Signed)
Case: [id number] Date/Time: 06/21/23 1015   Procedure: TRANSURETHRAL RESECTION OF THE PROSTATE (TURP) - 60 MINUTES   Anesthesia type: General   Pre-op diagnosis: URINARY RETENTION   Location: [facility] PROCEDURE ROOM / [facility]   Surgeons: Noel Christmas, MD       DISCUSSION: [name] is a 67 yo male who presents to PAT prior to surgery above. 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  Patient was seen at PAT visit on 10/7. Complained of R ankle/foot pain that started 2 days prior with associated RLE swelling. He was assessed at [facility] on the same day. Xray was obtained which was negative. Low suspicion for DVT. He was given a short course of steroids and pain medicine.  Patient follows with Cardiology at [facility] due to hx of CAD and is s/p heart transplant in 2013. On immunosuppressants. He was last seen in clinic on 04/26/23 for his annual follow up. Echo was done which was normal. BP controlled and no changes made to regimen. Diabetes controlled. Advised f/u in Feb 2025.  Patient follows with PCP for other chronic medical issues. Last seen by PCP on 05/20/23 for acute visit - has been having issues with intermittent N/V and unintentional weight loss of 20 pounds over the past several months. PCP is adjusting BP and diabetes meds and working patient up for weight loss.   VS:     06/13/2023    6:29 PM 06/13/2023    3:20 PM 05/20/2023   11:26 AM  Vitals with BMI  Height  5\' 8"  5\' 8"   Weight   202 lbs  BMI   30.72  Systolic 151 154 90  Diastolic 75 81 50  Pulse 91 88 95     PROVIDERS: Shelva Majestic, MD Cardiology: Joya Gaskins  LABS: Labs reviewed: Acceptable for surgery. (Paper copy of most recent labs in chart). CKD at baseline (all labs ordered are listed, but only abnormal results are displayed)  Labs Reviewed - No data to  display   IMAGES:  CXR 05/23/23:   FINDINGS: Postop changes consistent with cardiac transplant. No pneumonia or pulmonary edema. No pneumothorax or pleural effusion. Normal pulmonary vasculature. Old fractures left posterior seventh and eighth ribs.   IMPRESSION: Postop changes consistent with cardiac transplant. Otherwise no acute cardiopulmonary process.     EKG 04/26/23 @ [facility] (unable to view, report only):  NORMAL SINUS RHYTHM, rate 88 NONSPECIFIC T WAVE ABNORMALITY  WHEN COMPARED WITH ECG OF 15-Sep-2022 11:38,  NO SIGNIFICANT CHANGE WAS FOUND   CV:  Echo 04/26/23 [facility]):  Summary   1. The left ventricle is normal in size with upper normal wall thickness.    2. The left ventricular systolic function is normal, LVEF is visually  estimated at 65-70%.    3. The left atrium is mildly to moderately dilated in size.    4. The right ventricle is normal in size, with normal systolic function.   Stress echo 09/15/22 [facility]):  Summary   1. Pharmacologic stress echocardiogram is normal.    2. No electrocardiographic or echocardiographic evidence of myocardial  ischemia.   3. Ultrasound enhancing agent utilized to improved endocardial border  definition.   Past Medical History:  Diagnosis Date   Abdominal pain, epigastric 03/31/2009   Acute on chronic systolic heart failure (HCC) 03/02/2009   ANEMIA, OTHER UNSPEC 04/03/2008  AUTOMATIC IMPLANTABLE CARDIAC DEFIBRILLATOR SITU 02/26/2009   on old heart   Blood transfusion without reported diagnosis    CHF (congestive heart failure) (HCC)    Chronic kidney disease    CORONARY ARTERY DISEASE 04/25/2007   DIABETES MELLITUS, TYPE II 12/13/2008   no meds, diet contolled   GERD (gastroesophageal reflux disease)    HYPERLIPIDEMIA 04/25/2007   Hypertension    Hypothyroidism    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   MYOCARDIAL INFARCTION, 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   OBESITY 02/26/2009   THYROID DISEASE, HX OF 05/14/2008   VENTRICULAR  TACHYCARDIA 02/26/2009    Past Surgical History:  Procedure Laterality Date   ANGIOPLASTY     with stent   CARDIAC ASSIST DEVICE REMOVAL     CARDIAC CATHETERIZATION     COLONOSCOPY     CORONARY ARTERY BYPASS GRAFT     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   LEFT VENTRICULAR ASSIST DEVICE  05/2009   [facility]    MEDICATIONS: No current facility-administered medications for this encounter.    amLODipine (NORVASC) 10 MG tablet   aspirin EC 81 MG tablet   atorvastatin (LIPITOR) 80 MG tablet   cholecalciferol (VITAMIN D) 1000 units tablet   diltiazem (TIAZAC) 180 MG 24 hr capsule   ezetimibe (ZETIA) 10 MG tablet   famotidine (PEPCID) 20 MG tablet   levothyroxine (SYNTHROID, LEVOTHROID) 75 MCG tablet   magnesium oxide (MAG-OX) 400 (240 Mg) MG tablet   magnesium oxide (MAG-OX) 400 MG tablet   mycophenolate (CELLCEPT) 500 MG tablet   tacrolimus (PROGRAF) 0.5 MG capsule   ACCU-CHEK GUIDE test strip   blood glucose meter kit and supplies KIT   doxycycline (VIBRA-TABS) 100 MG tablet   HYDROcodone-acetaminophen (NORCO/VICODIN) 5-325 MG tablet   predniSONE (DELTASONE) 20 MG tablet

## 2023-06-15 NOTE — Unmapped (Signed)
Discussed recent labs with [facility], CPP.  Plan is to Make No Changes  with repeat labs in 3 Months.    Secure MyChart message sent to the patient to relay this information.    Lab Results   Component Value Date    TACROLIMUS 5.6 06/10/2023    SIROLIMUS <1.0 (L) 06/10/2023     Goal: Tac: 5-8  Current Dose: Tacrolimus 1 mg/0.5 mg    Lab Results   Component Value Date    BUN 23 06/10/2023    CREATININE 1.33 06/10/2023    K 4.7 06/10/2023    GLU 132 (H) 06/10/2023    MG 1.3 (L) 06/10/2023     Lab Results   Component Value Date    WBC 7.7 06/10/2023    HGB 11.9 (L) 06/10/2023    HCT 36.6 (L) 06/10/2023    PLT 273 06/10/2023    NEUTROABS 5,875 06/10/2023    EOSABS 92 06/10/2023

## 2023-06-16 NOTE — Anesthesia Preprocedure Evaluation (Addendum)
Anesthesia Evaluation  Patient identified by MRN, date of birth, ID band Patient awake    Reviewed: Allergy & Precautions, NPO status , Patient's Chart, lab work & pertinent test results  Airway Mallampati: I  TM Distance: <3 FB Neck ROM: Full    Dental  (+) Edentulous Upper, Edentulous Lower   Pulmonary former smoker   breath sounds clear to auscultation       Cardiovascular hypertension, Pt. on medications + CAD, + Past MI, + CABG and +CHF   Rhythm:Regular Rate:Normal  - s/p Heart Transplant   Neuro/Psych negative neurological ROS  negative psych ROS   GI/Hepatic Neg liver ROS,GERD  ,,  Endo/Other  diabetes, Type 2Hypothyroidism    Renal/GU Renal disease     Musculoskeletal  (+) Arthritis ,    Abdominal   Peds  Hematology  (+) Blood dyscrasia, anemia   Anesthesia Other Findings   Reproductive/Obstetrics                             Anesthesia Physical Anesthesia Plan  ASA: 3  Anesthesia Plan: General   Post-op Pain Management: Tylenol PO (pre-op)*   Induction: Intravenous  PONV Risk Score and Plan: 3 and Ondansetron, Dexamethasone and Midazolam  Airway Management Planned: LMA  Additional Equipment: None  Intra-op Plan:   Post-operative Plan: Extubation in OR  Informed Consent: I have reviewed the patients History and Physical, chart, labs and discussed the procedure including the risks, benefits and alternatives for the proposed anesthesia with the patient or authorized representative who has indicated his/her understanding and acceptance.       Plan Discussed with: CRNA  Anesthesia Plan Comments: (See PAT note from 10/7 by Sherlie Ban PA-C )        Anesthesia Quick Evaluation

## 2023-06-16 NOTE — Telephone Encounter (Signed)
Called and spoke with pt and he just wanted to make [place] aware that he had been seen at [facility].

## 2023-06-17 NOTE — H&P (Signed)
CC/HPI: cc: BPH with LUTs   03/30/2023: 67 year old man with a history of BPH with LUTS here for worsening urinary symptoms. He has a weak stream and incomplete emptying. PVR is 916 cc. Patient is on 0.4 mg of Flomax daily. He has a history of microwave therapy to his prostate in 2009. He was last seen in [facility] in 2017 with Dr. Patsi Sears.   IPSS: Not answered, 1, 2, 5, 4, 3, 3=18/35  QOL 6/6 terrible   04/13/2023:  Patient presents for 2-week reevaluation on doubling tamsulosin and elevated PVR, with renal ultrasound. Since last seen, patient does not endorse notable improvement to baseline voiding function. He continues with bothersome LUTS, with weak stream and incomplete bladder emptying. Today, he denies irritative symptoms, gross hematuria, flank pain, fever/chills, nausea/vomiting. Of note, patient is unable to leave a urine sample today. He also states that has a doctor's appointment in [place] on 8/20, for which she would prefer to not have a Foley catheter in place.   05/27/2023: 67 year old man with a history of BPH with LUTS and urinary retention here for follow-up. TRUS today shows a size of approximately 50 g. Patient's urodynamic study shows low flow with low max detrusor pressure. Patient had prostate surgery in 2009. He is taking tamsulosin 0.4 mg nightly but felt sick with 0.8 mg.     ALLERGIES: No Allergies    MEDICATIONS: Levothyroxine Sodium 75 mcg tablet  Tamsulosin Hcl 0.4 mg capsule  Tamsulosin Hcl  Amlodipine Besylate 5 mg tablet  Aspirin Ec 81 mg tablet, delayed release  Atorvastatin Calcium 80 mg tablet  Cholecalciferol 1 PO Daily  Diltiazem 24Hr Er (Cd) 180 mg capsule, ext release 24 hr  Ezetimibe  Famotidine  Magnesium Oxide 500 mg capsule  Mycophenolate Mofetil 500 mg tablet  Tacrolimus 0.5 mg capsule  Vitamin D3 25 mcg (1,000 unit) tablet     GU PSH: Complex cystometrogram, w/ void pressure and urethral pressure profile studies, any technique  - 05/06/2023 Complex Uroflow - 05/06/2023 Emg surf Electrd - 05/06/2023 Inject For cystogram - 05/06/2023 Intrabd voidng Press - 05/06/2023       PSH Notes: Pacemaker Placement   NON-GU PSH: CABG (coronary artery bypass grafting) - 2009 Transplant Heart - 2013     GU PMH: Urinary Retention - 05/06/2023, - 04/29/2023, - 04/13/2023 Incomplete bladder emptying - 04/13/2023, - 03/30/2023 Nocturia - 04/13/2023, (Stable), - 03/30/2023, Nocturia, - 2014 Urinary Urgency - 04/13/2023, (Stable), - 03/30/2023, Urinary urgency, - 2014 Weak Urinary Stream - 04/13/2023, (Stable), - 03/30/2023, - 2017 Abdominal Pain Unspec - 03/30/2023 BPH w/LUTS (Stable) - 03/30/2023, - 2017 Urinary Frequency (Stable) - 2017, Increased urinary frequency, - 2014 Acute Cystitis/UTI, Acute Cystitis - 2014 Bladder-neck stenosis/contracture, 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, Erectile dysfunction due to arterial insufficiency - 2014 Encounter for Prostate Cancer screening, Visit For: Screening Exam Malignant Neoplasm Prostate - 2014 Oth GU systems Signs/Symptoms, Other symptom or sign involving genitourinary system - 2014      PMH Notes:  2008-03-05 11:31:08 - Note: Acute Myocardial Infarction   NON-GU PMH: Obesity, Morbid obesity - 2014 Personal history of other diseases of the circulatory system, History of hypertension - 2014, History of cardiac disorder, - 2014 Personal history of other endocrine, nutritional and metabolic disease, History of hypercholesterolemia - 2014 Personal history of other specified conditions, History of heartburn - 2014 Vitamin D deficiency, unspecified, Vitamin D deficiency - 2014 Encounter for general adult medical examination without abnormal findings, Encounter for  preventive health examination - 2010 Diabetes Type 2 GERD    FAMILY HISTORY: 1 son - Runs in Family Family Health Status - Father alive at age 24 - Runs In Family Family Health Status - Mother's Age - Runs  In Family Family Health Status Number - Runs In Family   SOCIAL HISTORY: Marital Status: Single Preferred Language: English; Ethnicity: Not Hispanic Or Latino; Race: White Current Smoking Status: Patient does not smoke anymore. Has not smoked since 04/07/1991. Smoked for 20 years. Smoked 2 packs per day.  Does not drink anymore.  Drinks 4+ caffeinated drinks per day. Patient's occupation is/was Retired.    REVIEW OF SYSTEMS:    GU Review Male:   Patient denies frequent urination, hard to postpone urination, burning/ pain with urination, get up at night to urinate, leakage of urine, stream starts and stops, trouble starting your stream, have to strain to urinate , erection problems, and penile pain.  Gastrointestinal (Upper):   Patient denies nausea, vomiting, and indigestion/ heartburn.  Gastrointestinal (Lower):   Patient denies diarrhea and constipation.  Constitutional:   Patient denies fever, night sweats, weight loss, and fatigue.  Skin:   Patient denies skin rash/ lesion and itching.  Eyes:   Patient denies blurred vision and double vision.  Ears/ Nose/ Throat:   Patient denies sore throat and sinus problems.  Hematologic/Lymphatic:   Patient denies swollen glands and easy bruising.  Cardiovascular:   Patient denies leg swelling and chest pains.  Respiratory:   Patient denies cough and shortness of breath.  Endocrine:   Patient denies excessive thirst.  Musculoskeletal:   Patient denies back pain and joint pain.  Neurological:   Patient denies headaches and dizziness.  Psychologic:   Patient denies depression and anxiety.   VITAL SIGNS: None   MULTI-SYSTEM PHYSICAL EXAMINATION:    Constitutional: Well-nourished. No physical deformities. Normally developed. Good grooming.  Neck: Neck symmetrical, not swollen. Normal tracheal position.  Respiratory: No labored breathing, no use of accessory muscles.   Skin: No paleness, no jaundice, no cyanosis. No lesion, no ulcer, no rash.   Neurologic / Psychiatric: Oriented to time, oriented to place, oriented to person. No depression, no anxiety, no agitation.  Eyes: Normal conjunctivae. Normal eyelids.  Ears, Nose, Mouth, and Throat: Left ear no scars, no lesions, no masses. Right ear no scars, no lesions, no masses. Nose no scars, no lesions, no masses. Normal hearing. Normal lips.  Musculoskeletal: Normal gait and station of head and neck.     Complexity of Data:  Records Review:   Previous Patient Records  Urodynamics Review:   Review Urodynamics Tests  X-Ray Review: Prostate Ultrasound: Reviewed Films. Discussed With Patient.     03/06/08  PSA  Total PSA 0.99     PROCEDURES:         Flexible Cystoscopy - 52000  Risks, benefits, and some of the potential complications of the procedure were discussed at length with the patient including infection, bleeding, voiding discomfort, urinary retention, fever, chills, sepsis, and others. All questions were answered. Informed consent was obtained. Sterile technique and intraurethral analgesia were used.  Meatus:  Normal size. Normal location. Normal condition.  Urethra:  No strictures.  External Sphincter:  Normal.  Verumontanum:  Normal.  Prostate:  Obstructing lateral lobes. No intravesical median lobe.  Bladder Neck:  Non-obstructing.  Ureteral Orifices:  Normal location. Normal size. Normal shape. Effluxed clear urine.  Bladder:  No trabeculation. No tumors. Normal mucosa. No stones. Large floppy bladder.  The lower urinary tract was carefully examined. The procedure was well-tolerated and without complications. Antibiotic instructions were given. Instructions were given to call the office immediately for bloody urine, difficulty urinating, urinary retention, painful or frequent urination, fever, chills, nausea, vomiting or other illness. The patient stated that he understood these instructions and would comply with them.          Prostate Ultrasound - 62130   Length: 4.95cm Height: 3.51cm Width: 5.44cm Volume: 49.61ml      The transrectal ultrasound probe is introduced into the rectum, and the prostate is visualized. Ultrasonography is utilized throughout the procedure. At the conclusion of the procedure, the ultrasound probe is removed. The patient tolerates the procedure without complication. . Patient confirmed No Neulasta OnPro Device.           Visit Complexity - G2211    ASSESSMENT:      ICD-10 Details  1 GU:   BPH w/LUTS - N40.1 Chronic, Stable  2   Incomplete bladder emptying - R39.14 Chronic, Stable  3   Nocturia - R35.1 Chronic, Stable  4   Urinary Retention - R33.8 Chronic, Stable   PLAN:            Medications New Meds: Doxycycline Hyclate 100 mg tablet 1 tablet PO BID start 3 days before your prostate procedure  #6  0 Refill(s)  Pharmacy Name:  [id number]  Address:  [address]   [place]), [place] [address]  Phone:  [phone]  Fax:  [phone]            Schedule Return Visit/Planned Activity: 1 Month - Nurse Visit          Document Letter(s):  Created for Patient: Clinical Summary         Notes:   Areflexic bladder/BPH with LUTS/Urinary retention:  -Discussed patient's urodynamic study which shows low flow with decreased detrusor pressure consistent with areflexic bladder  -We discussed management of patient's urinary retention including CIC, indwelling Foley catheter to be changed monthly, suprapubic tube to be changed monthly, or last ditch effort of a TURP which may alleviate enough obstruction for patient's bladder to empty on its own.  -I did discuss with him high likelihood of failure of the surgery based on his urodynamic study but that it would be his only option to try and remove the Foley.  -Risks and benefits of the procedure discussed the patient in detail including but not limited to pain, bleeding, infection, demonstrating structures, need for additional treatment,  failure to alleviate obstruction or able to void on his own, retrograde ejaculation.   He would like to proceed with TURP. Continue foley catheter for now.

## 2023-06-19 ENCOUNTER — Other Ambulatory Visit: Payer: Self-pay | Admitting: Family Medicine

## 2023-06-21 ENCOUNTER — Encounter (HOSPITAL_COMMUNITY)
Admission: RE | Disposition: A | Discharge status: Home / Self Care | Payer: Self-pay | Source: Home / Self Care | Attending: Urology

## 2023-06-21 ENCOUNTER — Ambulatory Visit (HOSPITAL_COMMUNITY): Payer: Medicare HMO | Admitting: Physician Assistant

## 2023-06-21 ENCOUNTER — Other Ambulatory Visit: Payer: Self-pay

## 2023-06-21 ENCOUNTER — Observation Stay (HOSPITAL_COMMUNITY)
Admission: RE | Admit: 2023-06-21 | Discharge: 2023-06-22 | Disposition: A | Discharge status: Home / Self Care | Payer: Medicare HMO | Attending: Urology | Admitting: Urology

## 2023-06-21 ENCOUNTER — Encounter (HOSPITAL_COMMUNITY): Payer: Self-pay | Admitting: Urology

## 2023-06-21 ENCOUNTER — Ambulatory Visit (HOSPITAL_BASED_OUTPATIENT_CLINIC_OR_DEPARTMENT_OTHER): Payer: Medicare HMO | Admitting: Medical

## 2023-06-21 DIAGNOSIS — Z7982 Long term (current) use of aspirin: Secondary | ICD-10-CM | POA: Insufficient documentation

## 2023-06-21 DIAGNOSIS — N320 Bladder-neck obstruction: Secondary | ICD-10-CM | POA: Insufficient documentation

## 2023-06-21 DIAGNOSIS — N401 Enlarged prostate with lower urinary tract symptoms: Principal | ICD-10-CM | POA: Insufficient documentation

## 2023-06-21 DIAGNOSIS — R3914 Feeling of incomplete bladder emptying: Secondary | ICD-10-CM | POA: Insufficient documentation

## 2023-06-21 DIAGNOSIS — I110 Hypertensive heart disease with heart failure: Secondary | ICD-10-CM | POA: Insufficient documentation

## 2023-06-21 DIAGNOSIS — E039 Hypothyroidism, unspecified: Secondary | ICD-10-CM | POA: Diagnosis not present

## 2023-06-21 DIAGNOSIS — Z79899 Other long term (current) drug therapy: Secondary | ICD-10-CM | POA: Insufficient documentation

## 2023-06-21 DIAGNOSIS — Z23 Encounter for immunization: Secondary | ICD-10-CM | POA: Insufficient documentation

## 2023-06-21 DIAGNOSIS — R351 Nocturia: Secondary | ICD-10-CM | POA: Diagnosis not present

## 2023-06-21 DIAGNOSIS — R338 Other retention of urine: Secondary | ICD-10-CM | POA: Diagnosis not present

## 2023-06-21 DIAGNOSIS — Z95810 Presence of automatic (implantable) cardiac defibrillator: Secondary | ICD-10-CM | POA: Diagnosis not present

## 2023-06-21 DIAGNOSIS — R339 Retention of urine, unspecified: Secondary | ICD-10-CM | POA: Insufficient documentation

## 2023-06-21 DIAGNOSIS — Z87891 Personal history of nicotine dependence: Secondary | ICD-10-CM | POA: Diagnosis not present

## 2023-06-21 DIAGNOSIS — N138 Other obstructive and reflux uropathy: Principal | ICD-10-CM | POA: Diagnosis present

## 2023-06-21 DIAGNOSIS — I5022 Chronic systolic (congestive) heart failure: Secondary | ICD-10-CM | POA: Insufficient documentation

## 2023-06-21 DIAGNOSIS — Z951 Presence of aortocoronary bypass graft: Secondary | ICD-10-CM | POA: Insufficient documentation

## 2023-06-21 DIAGNOSIS — Z01818 Encounter for other preprocedural examination: Secondary | ICD-10-CM

## 2023-06-21 HISTORY — PX: TRANSURETHRAL RESECTION OF PROSTATE: SHX73

## 2023-06-21 LAB — GLUCOSE, CAPILLARY
Glucose-Capillary: 203 mg/dL — ABNORMAL HIGH (ref 70–99)
Glucose-Capillary: 159 mg/dL — ABNORMAL HIGH (ref 70–99)

## 2023-06-21 SURGERY — TURP (TRANSURETHRAL RESECTION OF PROSTATE)
Anesthesia: General

## 2023-06-21 MED ORDER — SODIUM CHLORIDE 0.9% FLUSH: 3.0000 mL | INTRAVENOUS | Status: DC | PRN

## 2023-06-21 MED ORDER — DIPHENHYDRAMINE HCL 50 MG/ML IJ SOLN: 12.5000 mg | Freq: Four times a day (QID) | INTRAMUSCULAR | Status: DC | PRN

## 2023-06-21 MED ORDER — DEXAMETHASONE SODIUM PHOSPHATE 4 MG/ML IJ SOLN
INTRAMUSCULAR | Status: DC | PRN
  Administered 2023-06-21: 5 mg via INTRAVENOUS

## 2023-06-21 MED ORDER — MIDAZOLAM HCL 5 MG/5ML IJ SOLN
INTRAMUSCULAR | Status: DC | PRN
  Administered 2023-06-21 (×2): 1 mg via INTRAVENOUS

## 2023-06-21 MED ORDER — TACROLIMUS 1 MG PO CAPS
1.0000 mg | ORAL_CAPSULE | Freq: Every morning | ORAL | Status: DC
  Filled 2023-06-21: qty 1

## 2023-06-21 MED ORDER — DILTIAZEM HCL ER COATED BEADS 120 MG PO CP24
360.0000 mg | ORAL_CAPSULE | Freq: Every evening | ORAL | Status: DC
  Administered 2023-06-21: 360 mg via ORAL
  Filled 2023-06-21: qty 3

## 2023-06-21 MED ORDER — SODIUM CHLORIDE 0.9 % IR SOLN
3000.0000 mL | Status: DC
  Administered 2023-06-21: 3000 mL

## 2023-06-21 MED ORDER — OXYCODONE HCL 5 MG/5ML PO SOLN: 5.0000 mg | Freq: Once | ORAL | Status: DC | PRN

## 2023-06-21 MED ORDER — 0.9 % SODIUM CHLORIDE (POUR BTL) OPTIME
TOPICAL | Status: DC | PRN
  Administered 2023-06-21: 1000 mL

## 2023-06-21 MED ORDER — SUCCINYLCHOLINE CHLORIDE 200 MG/10ML IV SOSY
PREFILLED_SYRINGE | INTRAVENOUS | Status: DC | PRN
Start: 2023-06-21 — End: 2023-06-21
  Administered 2023-06-21: 100 mg via INTRAVENOUS

## 2023-06-21 MED ORDER — ACETAMINOPHEN 160 MG/5ML PO SOLN: 325.0000 mg | ORAL | Status: DC | PRN

## 2023-06-21 MED ORDER — PROPOFOL 10 MG/ML IV BOLUS
INTRAVENOUS | Status: AC
  Filled 2023-06-21: qty 20

## 2023-06-21 MED ORDER — LEVOTHYROXINE SODIUM 50 MCG PO TABS
75.0000 ug | ORAL_TABLET | Freq: Every day | ORAL | Status: DC
  Administered 2023-06-22: 75 ug via ORAL
  Filled 2023-06-21: qty 1

## 2023-06-21 MED ORDER — ENSURE ENLIVE PO LIQD: 237.0000 mL | Freq: Two times a day (BID) | ORAL | Status: DC

## 2023-06-21 MED ORDER — FENTANYL CITRATE PF 50 MCG/ML IJ SOSY: 25.0000 ug | PREFILLED_SYRINGE | INTRAMUSCULAR | Status: DC | PRN

## 2023-06-21 MED ORDER — ACETAMINOPHEN 10 MG/ML IV SOLN: 1000.0000 mg | Freq: Once | INTRAVENOUS | Status: DC | PRN

## 2023-06-21 MED ORDER — LIDOCAINE HCL (CARDIAC) PF 100 MG/5ML IV SOSY
PREFILLED_SYRINGE | INTRAVENOUS | Status: DC | PRN
  Administered 2023-06-21: 50 mg via INTRAVENOUS

## 2023-06-21 MED ORDER — FENTANYL CITRATE (PF) 100 MCG/2ML IJ SOLN
INTRAMUSCULAR | Status: AC
  Filled 2023-06-21: qty 2

## 2023-06-21 MED ORDER — CEFAZOLIN SODIUM-DEXTROSE 2-4 GM/100ML-% IV SOLN
2.0000 g | INTRAVENOUS | Status: AC
  Administered 2023-06-21: 2 g via INTRAVENOUS
  Filled 2023-06-21: qty 100

## 2023-06-21 MED ORDER — FAMOTIDINE 20 MG PO TABS
20.0000 mg | ORAL_TABLET | Freq: Two times a day (BID) | ORAL | Status: DC
  Administered 2023-06-21: 20 mg via ORAL
  Filled 2023-06-21: qty 1

## 2023-06-21 MED ORDER — PHENYLEPHRINE 80 MCG/ML (10ML) SYRINGE FOR IV PUSH (FOR BLOOD PRESSURE SUPPORT)
PREFILLED_SYRINGE | INTRAVENOUS | Status: AC
  Filled 2023-06-21: qty 10

## 2023-06-21 MED ORDER — TACROLIMUS 0.5 MG PO CAPS: 0.5000 mg | ORAL_CAPSULE | ORAL | Status: DC

## 2023-06-21 MED ORDER — FENTANYL CITRATE (PF) 100 MCG/2ML IJ SOLN
INTRAMUSCULAR | Status: DC | PRN
  Administered 2023-06-21 (×2): 50 ug via INTRAVENOUS

## 2023-06-21 MED ORDER — LACTATED RINGERS IV SOLN: INTRAVENOUS | Status: DC

## 2023-06-21 MED ORDER — TRIPLE ANTIBIOTIC 3.5-400-5000 EX OINT: 1.0000 | TOPICAL_OINTMENT | Freq: Three times a day (TID) | CUTANEOUS | Status: DC | PRN

## 2023-06-21 MED ORDER — CHLORHEXIDINE GLUCONATE 0.12 % MT SOLN
15.0000 mL | Freq: Once | OROMUCOSAL | Status: AC
  Administered 2023-06-21: 15 mL via OROMUCOSAL

## 2023-06-21 MED ORDER — TACROLIMUS 0.5 MG PO CAPS
0.5000 mg | ORAL_CAPSULE | Freq: Every day | ORAL | Status: DC
  Administered 2023-06-21: 0.5 mg via ORAL
  Filled 2023-06-21: qty 1

## 2023-06-21 MED ORDER — SODIUM CHLORIDE 0.9% FLUSH: 3.0000 mL | Freq: Two times a day (BID) | INTRAVENOUS | Status: DC

## 2023-06-21 MED ORDER — SODIUM CHLORIDE 0.9 % IV SOLN: 250.0000 mL | INTRAVENOUS | Status: DC | PRN

## 2023-06-21 MED ORDER — DROPERIDOL 2.5 MG/ML IJ SOLN: 0.6250 mg | Freq: Once | INTRAMUSCULAR | Status: DC | PRN

## 2023-06-21 MED ORDER — ONDANSETRON HCL 4 MG/2ML IJ SOLN
INTRAMUSCULAR | Status: DC | PRN
  Administered 2023-06-21: 4 mg via INTRAVENOUS

## 2023-06-21 MED ORDER — SODIUM CHLORIDE 0.45 % IV SOLN: INTRAVENOUS | Status: DC

## 2023-06-21 MED ORDER — DIPHENHYDRAMINE HCL 12.5 MG/5ML PO ELIX: 12.5000 mg | ORAL_SOLUTION | Freq: Four times a day (QID) | ORAL | Status: DC | PRN

## 2023-06-21 MED ORDER — MYCOPHENOLATE MOFETIL 250 MG PO CAPS
500.0000 mg | ORAL_CAPSULE | Freq: Two times a day (BID) | ORAL | Status: DC
  Administered 2023-06-21: 500 mg via ORAL
  Filled 2023-06-21 (×2): qty 2

## 2023-06-21 MED ORDER — SODIUM CHLORIDE 0.9 % IR SOLN
Status: DC | PRN
  Administered 2023-06-21: 12000 mL

## 2023-06-21 MED ORDER — OXYCODONE HCL 5 MG PO TABS: 5.0000 mg | ORAL_TABLET | Freq: Once | ORAL | Status: DC | PRN

## 2023-06-21 MED ORDER — INSULIN ASPART 100 UNIT/ML IJ SOLN
0.0000 [IU] | INTRAMUSCULAR | Status: DC | PRN
  Administered 2023-06-21: 4 [IU] via SUBCUTANEOUS

## 2023-06-21 MED ORDER — MIDAZOLAM HCL 2 MG/2ML IJ SOLN
INTRAMUSCULAR | Status: AC
  Filled 2023-06-21: qty 2

## 2023-06-21 MED ORDER — CEFAZOLIN SODIUM-DEXTROSE 2-4 GM/100ML-% IV SOLN
2.0000 g | Freq: Three times a day (TID) | INTRAVENOUS | Status: AC
  Administered 2023-06-21 – 2023-06-22 (×2): 2 g via INTRAVENOUS
  Filled 2023-06-21 (×2): qty 100

## 2023-06-21 MED ORDER — ACETAMINOPHEN 325 MG PO TABS: 325.0000 mg | ORAL_TABLET | ORAL | Status: DC | PRN

## 2023-06-21 MED ORDER — LIDOCAINE HCL (PF) 2 % IJ SOLN
INTRAMUSCULAR | Status: AC
  Filled 2023-06-21: qty 5

## 2023-06-21 MED ORDER — ONDANSETRON HCL 4 MG/2ML IJ SOLN: 4.0000 mg | INTRAMUSCULAR | Status: DC | PRN

## 2023-06-21 MED ORDER — DEXAMETHASONE SODIUM PHOSPHATE 10 MG/ML IJ SOLN
INTRAMUSCULAR | Status: AC
  Filled 2023-06-21: qty 1

## 2023-06-21 MED ORDER — SENNOSIDES-DOCUSATE SODIUM 8.6-50 MG PO TABS
2.0000 | ORAL_TABLET | Freq: Every day | ORAL | Status: DC
  Administered 2023-06-21: 2 via ORAL
  Filled 2023-06-21: qty 2

## 2023-06-21 MED ORDER — GLUCOSE BLOOD VI STRP: 1.0000 | ORAL_STRIP | Freq: Three times a day (TID) | Status: DC

## 2023-06-21 MED ORDER — PROPOFOL 10 MG/ML IV BOLUS
INTRAVENOUS | Status: DC | PRN
  Administered 2023-06-21: 150 mg via INTRAVENOUS

## 2023-06-21 MED ORDER — OXYCODONE HCL 5 MG PO TABS: 5.0000 mg | ORAL_TABLET | ORAL | Status: DC | PRN

## 2023-06-21 MED ORDER — ONDANSETRON HCL 4 MG/2ML IJ SOLN
INTRAMUSCULAR | Status: AC
  Filled 2023-06-21: qty 2

## 2023-06-21 MED ORDER — INFLUENZA VAC A&B SURF ANT ADJ 0.5 ML IM SUSY
0.5000 mL | PREFILLED_SYRINGE | INTRAMUSCULAR | Status: AC
  Administered 2023-06-22: 0.5 mL via INTRAMUSCULAR
  Filled 2023-06-21: qty 0.5

## 2023-06-21 MED ORDER — ATORVASTATIN CALCIUM 40 MG PO TABS: 80.0000 mg | ORAL_TABLET | Freq: Every day | ORAL | Status: DC

## 2023-06-21 MED ORDER — TACROLIMUS 1 MG PO CAPS
1.0000 mg | ORAL_CAPSULE | Freq: Every day | ORAL | Status: DC
  Filled 2023-06-21: qty 1

## 2023-06-21 MED ORDER — EZETIMIBE 10 MG PO TABS
10.0000 mg | ORAL_TABLET | Freq: Every day | ORAL | Status: DC
  Administered 2023-06-21: 10 mg via ORAL
  Filled 2023-06-21: qty 1

## 2023-06-21 MED ORDER — TACROLIMUS 0.5 MG PO CAPS: 0.5000 mg | ORAL_CAPSULE | Freq: Every morning | ORAL | Status: DC

## 2023-06-21 MED ORDER — MORPHINE SULFATE (PF) 2 MG/ML IV SOLN: 2.0000 mg | INTRAVENOUS | Status: DC | PRN

## 2023-06-21 MED ORDER — INSULIN ASPART 100 UNIT/ML IJ SOLN
INTRAMUSCULAR | Status: AC
  Filled 2023-06-21: qty 1

## 2023-06-21 MED ORDER — ORAL CARE MOUTH RINSE: 15.0000 mL | Freq: Once | OROMUCOSAL | Status: AC

## 2023-06-21 MED ORDER — AMLODIPINE BESYLATE 10 MG PO TABS: 10.0000 mg | ORAL_TABLET | Freq: Every morning | ORAL | Status: DC

## 2023-06-21 SURGICAL SUPPLY — 22 items
BAG DRN RND TRDRP ANRFLXCHMBR (UROLOGICAL SUPPLIES)
BAG URINE DRAIN 2000ML AR STRL (UROLOGICAL SUPPLIES) IMPLANT
BAG URO CATCHER STRL LF (MISCELLANEOUS) ×2 IMPLANT
CATH FOLEY 3WAY 30CC 22FR (CATHETERS) IMPLANT
CATH HEMA 3WAY 30CC 22FR COUDE (CATHETERS) IMPLANT
DRAPE FOOT SWITCH (DRAPES) ×2 IMPLANT
GLOVE BIO SURGEON STRL SZ 6.5 (GLOVE) ×2 IMPLANT
GOWN STRL REUS W/ TWL LRG LVL3 (GOWN DISPOSABLE) ×2 IMPLANT
GOWN STRL REUS W/TWL LRG LVL3 (GOWN DISPOSABLE) ×1
HOLDER FOLEY CATH W/STRAP (MISCELLANEOUS) ×2 IMPLANT
KIT TURNOVER KIT A (KITS) IMPLANT
LOOP CUT BIPOLAR 24F LRG (ELECTROSURGICAL) ×2 IMPLANT
MANIFOLD NEPTUNE II (INSTRUMENTS) ×2 IMPLANT
PACK CYSTO (CUSTOM PROCEDURE TRAY) ×2 IMPLANT
PAD PREP 24X48 CUFFED NSTRL (MISCELLANEOUS) ×2 IMPLANT
PLUG CATH AND CAP STRL 200 (CATHETERS) ×2 IMPLANT
SYR 30ML LL (SYRINGE) IMPLANT
SYR TOOMEY IRRIG 70ML (MISCELLANEOUS) ×1
SYRINGE TOOMEY IRRIG 70ML (MISCELLANEOUS) ×2 IMPLANT
TUBING CONNECTING 10 (TUBING) ×2 IMPLANT
TUBING UROLOGY SET (TUBING) ×2 IMPLANT
WATER STERILE IRR 500ML POUR (IV SOLUTION) IMPLANT

## 2023-06-21 NOTE — Anesthesia Postprocedure Evaluation (Signed)
Anesthesia Post Note  Patient: [name]  Procedure(s) Performed: TRANSURETHRAL RESECTION OF THE PROSTATE (TURP)     Patient location during evaluation: PACU Anesthesia Type: General Level of consciousness: awake and alert Pain management: pain level controlled Vital Signs Assessment: post-procedure vital signs reviewed and stable Respiratory status: spontaneous breathing, nonlabored ventilation, respiratory function stable and patient connected to nasal cannula oxygen Cardiovascular status: blood pressure returned to baseline and stable Postop Assessment: no apparent nausea or vomiting Anesthetic complications: no  No notable events documented.  Last Vitals:  Vitals:   06/21/23 1200 06/21/23 1234  BP: (!) 141/67 (!) 154/66  Pulse: 77 82  Resp: 11 14  Temp:  36.5 C  SpO2: 95% 99%    Last Pain:  Vitals:   06/21/23 1236  TempSrc:   PainSc: 1                  Shelton Silvas

## 2023-06-21 NOTE — Transfer of Care (Signed)
Immediate Anesthesia Transfer of Care Note  Patient: ASMAR BROZEK  Procedure(s) Performed: TRANSURETHRAL RESECTION OF THE PROSTATE (TURP)  Patient Location: PACU  Anesthesia Type:General  Level of Consciousness: awake, alert , and oriented  Airway & Oxygen Therapy: Patient Spontanous Breathing and Patient connected to face mask oxygen  Post-op Assessment: Report given to RN and Post -op Vital signs reviewed and stable  Post vital signs: Reviewed and stable  Last Vitals:  Vitals Value Taken Time  BP 146/65 06/21/23 1108  Temp    Pulse 86 06/21/23 1110  Resp 15 06/21/23 1110  SpO2 100 % 06/21/23 1110  Vitals shown include unfiled device data.  Last Pain:  Vitals:   06/21/23 0809  TempSrc: Oral         Complications: No notable events documented.

## 2023-06-21 NOTE — Anesthesia Procedure Notes (Signed)
Procedure Name: Intubation Date/Time: 06/21/2023 10:18 AM  Performed by: Cleda Clarks, CRNAPre-anesthesia Checklist: Patient identified, Emergency Drugs available, Suction available and Patient being monitored Patient Re-evaluated:Patient Re-evaluated prior to induction Oxygen Delivery Method: Circle system utilized Preoxygenation: Pre-oxygenation with 100% oxygen Induction Type: IV induction Ventilation: Mask ventilation without difficulty Laryngoscope Size: Miller and 2 Grade View: Grade II Tube type: Oral Tube size: 7.0 mm Number of attempts: 1 Airway Equipment and Method: Stylet and Oral airway Placement Confirmation: ETT inserted through vocal cords under direct vision, positive ETCO2 and breath sounds checked- equal and bilateral Secured at: 20 cm Tube secured with: Tape Dental Injury: Teeth and Oropharynx as per pre-operative assessment

## 2023-06-21 NOTE — Discharge Instructions (Signed)
Post transurethral resection of the prostate (TURP) instructions  Your recent prostate surgery requires very special post hospital care. Despite the fact that no skin incisions were used the area around the prostate incision is quite raw and is covered with a scab to promote healing and prevent bleeding. Certain cautions are needed to assure that the scab is not disturbed of the next 2-3 weeks while the healing proceeds.  Because the raw surface in your prostate and the irritating effects of urine you may expect frequency of urination and/or urgency (a stronger desire to urinate) and perhaps even getting up at night more often. This will usually resolve or improve slowly over the healing period. You may see some blood in your urine over the first 6 weeks. Do not be alarmed, even if the urine was clear for a while. Get off your feet and drink lots of fluids until clearing occurs. If you start to pass clots or don't improve call us.  Catheter: (If you are discharged with a catheter.) 1. Keep your catheter secured to your leg at all times with tape or the supplied strap. 2. You may experience leakage of urine around your catheter- as long as the  catheter continues to drain, this is normal.  If your catheter stops draining  go to the ER. 3. You may also have blood in your urine, even after it has been clear for  several days; you may even pass some small blood clots or other material.  This  is normal as well.  If this happens, sit down and drink plenty of water to help  make urine to flush out your bladder.  If the blood in your urine becomes worse  after doing this, contact our office or return to the ER. 4. You may use the leg bag (small bag) during the day, but use the large bag at  night.  Diet:  You may return to your normal diet immediately. Because of the raw surface of your bladder, alcohol, spicy foods, foods high in acid and drinks with caffeine may cause irritation or frequency and  should be used in moderation. To keep your urine flowing freely and avoid constipation, drink plenty of fluids during the day (8-10 glasses). Tip: Avoid cranberry juice because it is very acidic.  Activity:  Your physical activity doesn't need to be restricted. However, if you are very active, you may see some blood in the urine. We suggest that you reduce your activity under the circumstances until the bleeding has stopped.  Bowels:  It is important to keep your bowels regular during the postoperative period. Straining with bowel movements can cause bleeding. A bowel movement every other day is reasonable. Use a mild laxative if needed, such as milk of magnesia 2-3 tablespoons, or 2 Dulcolax tablets. Call if you continue to have problems. If you had been taking narcotics for pain, before, during or after your surgery, you may be constipated. Take a laxative if necessary.  Medication:  You should resume your pre-surgery medications unless told not to. DO NOT RESUME YOUR ASPIRIN, WARFARIN, OR OTHER BLOOD THINNER FOR 1 WEEK. In addition you may be given an antibiotic to prevent or treat infection. Antibiotics are not always necessary. All medication should be taken as prescribed until the bottles are finished unless you are having an unusual reaction to one of the drugs.     Problems you should report to [place]:  a. Fever greater than 101F. b. Heavy bleeding, or clots (see  notes above about blood in urine). c. Inability to urinate. d. Drug reactions (hives, rash, nausea, vomiting, diarrhea). e. Severe burning or pain with urination that is not improving.

## 2023-06-21 NOTE — Op Note (Signed)
Preoperative diagnosis: Bladder outlet obstruction secondary to BPH Urinary retention  Postoperative diagnosis:  Bladder outlet obstruction secondary to BPH Urinary retention  Procedure:  Cystoscopy Transurethral resection of the prostate  Surgeon: Kasandra Knudsen, MD  Anesthesia: General  Complications: None  EBL: Minimal  Specimens: Prostate chips  Findings:  1.  Normal anterior urethra 2.  Prostatic urethra with lateral lobe obstruction, no significant intravesical median lobe 3.  Bilateral orthotopic ureteral orifices seen at start end of case 4.  Normal bladder mucosa without masses or abnormalities.  No trabeculation.  Indication: [name] is a patient with bladder outlet obstruction secondary to benign prostatic hyperplasia resulting in urinary retention. After reviewing the management options for treatment, he elected to proceed with the above surgical procedure(s). We have discussed the potential benefits and risks of the procedure, side effects of the proposed treatment, the likelihood of the patient achieving the goals of the procedure, and any potential problems that might occur during the procedure or recuperation. Informed consent has been obtained.  Description of procedure:  The patient was taken to the operating room and general anesthesia was induced.  The patient was placed in the dorsal lithotomy position, prepped and draped in the usual sterile fashion, and preoperative antibiotics were administered. A preoperative time-out was performed.   Cystourethroscopy was performed.  The patient's urethra was examined and demonstrated bilobar prostatic hypertrophy.  The bladder was then systematically examined in its entirety. There was no evidence of any bladder tumors, stones, or other mucosal pathology.  The ureteral orifices were identified and marked so as to be avoided during the procedure.  The prostate adenoma was then resected utilizing loop  cautery resection with the bipolar cutting loop.  The prostate adenoma from the bladder neck back to the verumontanum was resected beginning at the six o'clock position and then extended to include the right and left lobes of the prostate and anterior prostate. Care was taken not to resect distal to the verumontanum.   Hemostasis was then achieved with the cautery and the bladder was emptied and reinspected with no significant bleeding noted at the end of the procedure.    A 22Fr 3-way catheter was then placed into the bladder.  The patient appeared to tolerate the procedure well and without complications.  The patient was able to be awakened and transferred to the recovery unit in satisfactory condition.

## 2023-06-21 NOTE — Interval H&P Note (Signed)
History and Physical Interval Note:  06/21/2023 8:55 AM  [name]  has presented today for surgery, with the diagnosis of URINARY RETENTION.  The various methods of treatment have been discussed with the patient and family. After consideration of risks, benefits and other options for treatment, the patient has consented to  Procedure(s) with comments: TRANSURETHRAL RESECTION OF THE PROSTATE (TURP) (N/A) - 60 MINUTES as a surgical intervention.  The patient's history has been reviewed, patient examined, no change in status, stable for surgery.  I have reviewed the patient's chart and labs.  Questions were answered to the patient's satisfaction.     Derryl Uher D Sigismund Cross

## 2023-06-22 ENCOUNTER — Encounter (HOSPITAL_COMMUNITY): Payer: Self-pay | Admitting: Urology

## 2023-06-22 DIAGNOSIS — I110 Hypertensive heart disease with heart failure: Secondary | ICD-10-CM | POA: Diagnosis not present

## 2023-06-22 DIAGNOSIS — N401 Enlarged prostate with lower urinary tract symptoms: Secondary | ICD-10-CM | POA: Diagnosis not present

## 2023-06-22 DIAGNOSIS — Z95810 Presence of automatic (implantable) cardiac defibrillator: Secondary | ICD-10-CM | POA: Diagnosis not present

## 2023-06-22 DIAGNOSIS — Z79899 Other long term (current) drug therapy: Secondary | ICD-10-CM | POA: Diagnosis not present

## 2023-06-22 DIAGNOSIS — N320 Bladder-neck obstruction: Secondary | ICD-10-CM | POA: Diagnosis not present

## 2023-06-22 DIAGNOSIS — I5022 Chronic systolic (congestive) heart failure: Secondary | ICD-10-CM | POA: Diagnosis not present

## 2023-06-22 DIAGNOSIS — R339 Retention of urine, unspecified: Secondary | ICD-10-CM | POA: Diagnosis not present

## 2023-06-22 DIAGNOSIS — Z7982 Long term (current) use of aspirin: Secondary | ICD-10-CM | POA: Diagnosis not present

## 2023-06-22 DIAGNOSIS — Z951 Presence of aortocoronary bypass graft: Secondary | ICD-10-CM | POA: Diagnosis not present

## 2023-06-22 LAB — SURGICAL PATHOLOGY

## 2023-06-22 LAB — GLUCOSE, CAPILLARY: Glucose-Capillary: 310 mg/dL — ABNORMAL HIGH (ref 70–99)

## 2023-06-22 NOTE — TOC Transition Note (Signed)
Transition of Care [facility]) - CM/SW Discharge Note   Patient Details  Name: [name] MRN: [id number] Date of Birth: 1956-03-06  Transition of [facility]) CM/SW Contact:  Howell Rucks, RN Phone Number: 06/22/2023, 9:40 AM   Clinical Narrative:  Patient discharged prior to being able to issue MOON.            Patient Goals and CMS Choice      Discharge Placement                         Discharge Plan and Services Additional resources added to the After Visit Summary for                                       Social Determinants of Health (SDOH) Interventions SDOH Screenings   Food Insecurity: No Food Insecurity (06/21/2023)  Recent Concern: Food Insecurity - Food Insecurity Present (04/19/2023)   Received from [facility]  Housing: Low Risk  (06/21/2023)  Transportation Needs: No Transportation Needs (06/21/2023)  Utilities: Not At Risk (06/21/2023)  Alcohol Screen: Low Risk  (06/05/2021)  Depression (PHQ2-9): Low Risk  (01/06/2023)  Financial Resource Strain: Low Risk  (04/19/2023)   Received from [facility]  Physical Activity: Inactive (07/13/2022)  Social Connections: Moderately Isolated (07/13/2022)  Stress: No Stress Concern Present (06/05/2021)  Tobacco Use: Medium Risk (06/21/2023)     Readmission Risk Interventions     No data to display

## 2023-06-22 NOTE — Discharge Summary (Signed)
Date of admission: 06/21/2023  Date of discharge: 06/22/2023  Admission diagnosis: BPH with BOO/urinary retention  Discharge diagnosis: BPH with BOO/urinary retention  Secondary diagnoses:  Patient Active Problem List   Diagnosis Date Noted   BPH with obstruction/lower urinary tract symptoms 06/21/2023   Nausea and vomiting 05/18/2023   Immunosuppressed status (HCC) 02/23/2019   Chronic abdominal pain 12/27/2015   Heart transplanted (HCC) 06/19/2014   GERD (gastroesophageal reflux disease) 06/19/2014   Osteoarthritis 06/19/2014   Benign prostatic hyperplasia 02/26/2014   History of adenomatous polyp of colon 09/17/2013   Chronic systolic heart failure (HCC) 03/02/2009   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   AUTOMATIC IMPLANTABLE CARDIAC DEFIBRILLATOR SITU 02/26/2009   Diabetes mellitus type II, controlled (HCC) 12/13/2008   Hypothyroidism 05/14/2008   Essential hypertension 05/14/2008   ANEMIA, OTHER UNSPEC 04/03/2008   Hyperlipidemia associated with type 2 diabetes mellitus (HCC) 04/25/2007   Coronary atherosclerosis 04/25/2007    Procedures performed: Procedure(s): TRANSURETHRAL RESECTION OF THE PROSTATE (TURP)  History and Physical: For full details, please see admission history and physical. Briefly, [name] is a 67 y.o. year old patient with BPH with BOO and urinary retention.    Gen: NAD, laying in bed comfortably Resp: nonlabored respirations GU: foley with light pink urine, not clots, CBI off  Hospital Course: Patient tolerated the procedure well.  He was then transferred to the floor after an uneventful PACU stay.  His hospital course was uncomplicated.  On POD#1 he had met discharge criteria: was eating a regular diet, was up and ambulating independently,  pain was well controlled, CBI had been turned off and was ready to for discharge.   Laboratory values:  No results for input(s): "WBC", "HGB", "HCT" in the last 72 hours. No results for input(s):  "NA", "K", "CL", "CO2", "GLUCOSE", "BUN", "CREATININE", "CALCIUM" in the last 72 hours. No results for input(s): "LABPT", "INR" in the last 72 hours. No results for input(s): "LABURIN" in the last 72 hours. Results for orders placed or performed during the hospital encounter of 06/13/23  Surgical pcr screen     Status: None   Collection Time: 06/13/23  2:13 PM   Specimen: Nasal Mucosa; Nasal Swab  Result Value Ref Range Status   MRSA, PCR NEGATIVE NEGATIVE Final   Staphylococcus aureus NEGATIVE NEGATIVE Final    Comment: (NOTE) The Xpert SA Assay (FDA approved for NASAL specimens in patients 6 years of age and older), is one component of a comprehensive surveillance program. It is not intended to diagnose infection nor to guide or monitor treatment. Performed at [facility], [address] [place], [place] [address]     Disposition: Home  Discharge instruction: The patient was instructed to be ambulatory but told to refrain from heavy lifting, strenuous activity, or driving.   Discharge medications:  Allergies as of 06/22/2023   No Known Allergies      Medication List     TAKE these medications    Accu-Chek Guide test strip Generic drug: glucose blood USE 1 STRIP 4 TIMES DAILY What changed: See the new instructions.   amLODipine 10 MG tablet Commonly known as: NORVASC Take 10 mg by mouth in the morning.   aspirin EC 81 MG tablet Take 1 tablet (81 mg total) by mouth daily.   atorvastatin 80 MG tablet Commonly known as: LIPITOR Take 1 tablet (80 mg total) by mouth daily at 6 PM.   blood glucose meter kit and supplies Kit Dispense based on patient and  insurance preference. Use up to four times daily as directed.   cholecalciferol 1000 units tablet Commonly known as: VITAMIN D Take 3 tablets (3,000 Units total) by mouth daily.   diltiazem 180 MG 24 hr capsule Commonly known as: TIAZAC Take 2 capsules (360 mg total) by mouth daily. What  changed: when to take this   doxycycline 100 MG tablet Commonly known as: VIBRA-TABS Take 100 mg by mouth 2 (two) times daily.   ezetimibe 10 MG tablet Commonly known as: ZETIA Take 1 tablet by mouth once daily   famotidine 20 MG tablet Commonly known as: PEPCID Take 20 mg by mouth in the morning and at bedtime.   levothyroxine 75 MCG tablet Commonly known as: SYNTHROID Take 1 tablet (75 mcg total) by mouth daily before breakfast.   magnesium oxide 400 (240 Mg) MG tablet Commonly known as: MAG-OX Take 400 mg by mouth in the morning.   magnesium oxide 400 MG tablet Commonly known as: MAG-OX Take 1.5 tablets (600 mg total) by mouth 2 (two) times daily. What changed:  how much to take when to take this   mycophenolate 500 MG tablet Commonly known as: CELLCEPT Take 1 tablet (500 mg total) by mouth 2 (two) times daily.   tacrolimus 0.5 MG capsule Commonly known as: PROGRAF Take 2 capsules (1 mg total) by mouth 2 (two) times daily. What changed:  how much to take when to take this additional instructions        Followup:   Follow-up Information     [facility] Follow up on 06/28/2023.   Why: 8:15am Contact information: [address] [place] [address] [phone]

## 2023-06-22 NOTE — Unmapped (Signed)
[facility] Refill Coordination Note    Specialty Medication(s) to be Shipped:   Transplant: tacrolimus 0.5mg     Other medication(s) to be shipped:  diltiazem and levothyroxine     Andrew Dickerson, DOB: 1956/05/17  Phone: There are no phone numbers on file.      All above HIPAA information was verified with patient.     Was a Nurse, learning disability used for this call? No    Completed refill call assessment today to schedule patient's medication shipment from the [facility] [phone]).  All relevant notes have been reviewed.     Specialty medication(s) and dose(s) confirmed: Regimen is correct and unchanged.   Changes to medications: Guerin reports no changes at this time.  Changes to insurance: No  New side effects reported not previously addressed with a pharmacist or physician: None reported  Questions for the pharmacist: No    Confirmed patient received a Conservation officer, historic buildings and a Surveyor, mining with first shipment. The patient will receive a drug information handout for each medication shipped and additional FDA Medication Guides as required.       DISEASE/MEDICATION-SPECIFIC INFORMATION        N/A    SPECIALTY MEDICATION ADHERENCE     Medication Adherence    Patient reported X missed doses in the last month: 1  Specialty Medication: Tacrolimus 0.5 MG  Patient is on additional specialty medications: No  Informant: patient     Were doses missed due to medication being on hold? No    Tacrolimus 0.5 mg: 10 days of medicine on hand       REFERRAL TO PHARMACIST     Referral to the pharmacist: Not needed      [facility]     Shipping address confirmed in Epic.     Delivery Scheduled: Yes, Expected medication delivery date: 06/28/23.     Medication will be delivered via UPS to the prescription address in Epic [place].    Wyatt Mage M Elisabeth Cara   [facility] Specialty Technician

## 2023-06-27 MED FILL — TACROLIMUS 0.5 MG CAPSULE, IMMEDIATE-RELEASE: ORAL | 30 days supply | Qty: 90 | Fill #2

## 2023-06-27 MED FILL — LEVOTHYROXINE 75 MCG TABLET: ORAL | 30 days supply | Qty: 30 | Fill #9

## 2023-06-27 MED FILL — DILTIAZEM CD 180 MG CAPSULE,EXTENDED RELEASE 24 HR: ORAL | 30 days supply | Qty: 60 | Fill #9

## 2023-07-13 ENCOUNTER — Other Ambulatory Visit: Payer: Self-pay

## 2023-07-13 ENCOUNTER — Ambulatory Visit
Admission: EM | Admit: 2023-07-13 | Discharge: 2023-07-13 | Disposition: A | Discharge status: Home / Self Care | Payer: Medicare HMO | Attending: Physician Assistant | Admitting: Physician Assistant

## 2023-07-13 ENCOUNTER — Ambulatory Visit: Payer: Medicare HMO

## 2023-07-13 DIAGNOSIS — M7732 Calcaneal spur, left foot: Secondary | ICD-10-CM | POA: Diagnosis not present

## 2023-07-13 DIAGNOSIS — M79672 Pain in left foot: Secondary | ICD-10-CM | POA: Diagnosis not present

## 2023-07-13 DIAGNOSIS — M7989 Other specified soft tissue disorders: Secondary | ICD-10-CM | POA: Diagnosis not present

## 2023-07-13 MED ORDER — PREDNISONE 20 MG PO TABS
40.0000 mg | ORAL_TABLET | Freq: Every day | ORAL | 0 refills | Status: AC
Start: 2023-07-13 — End: 2023-07-18

## 2023-07-13 NOTE — ED Triage Notes (Signed)
Patient presents with left foot pain that started hurting last night. Patient states it hurts with movement and touch. No treatment used.

## 2023-07-13 NOTE — Discharge Instructions (Signed)
Follow up with ortho if symptoms persist or worsen.

## 2023-07-19 ENCOUNTER — Ambulatory Visit (INDEPENDENT_AMBULATORY_CARE_PROVIDER_SITE_OTHER): Payer: Medicare HMO

## 2023-07-19 VITALS — Wt 198.2 lb

## 2023-07-19 DIAGNOSIS — Z0000 Encounter for general adult medical examination without abnormal findings: Secondary | ICD-10-CM

## 2023-07-19 NOTE — Progress Notes (Signed)
Subjective:   [name] is a 67 y.o. male who presents for Medicare Annual/Subsequent preventive examination.  Visit Complete: Virtual I connected with  [name] on 07/19/23 by a audio enabled telemedicine application and verified that I am speaking with the correct person using two identifiers.  Patient Location: Home  Provider Location: Office/Clinic  I discussed the limitations of evaluation and management by telemedicine. The patient expressed understanding and agreed to proceed.  Vital Signs: Because this visit was a virtual/telehealth visit, some criteria may be missing or patient reported. Any vitals not documented were not able to be obtained and vitals that have been documented are patient reported.   Cardiac Risk Factors include: advanced age (>16men, >66 women);diabetes mellitus;dyslipidemia;hypertension;obesity (BMI >30kg/m2)     Objective:    Today's Vitals   07/19/23 1134  Weight: 198 lb 3.2 oz (89.9 kg)   Body mass index is 30.14 kg/m.     07/19/2023   11:38 AM 07/13/2023    2:56 PM 06/21/2023    7:59 AM 01/26/2023   10:39 AM 07/13/2022    2:52 PM 07/07/2021   11:15 AM 06/05/2021    2:50 PM  Advanced Directives  Does Patient Have a Medical Advance Directive? No No No No No No No  Would patient like information on creating a medical advance directive? No - Patient declined  No - Patient declined No - Patient declined No - Patient declined No - Patient declined No - Patient declined    Current Medications (verified) Outpatient Encounter Medications as of 07/19/2023  Medication Sig   ACCU-CHEK GUIDE test strip USE 1 STRIP 4 TIMES DAILY   amLODipine (NORVASC) 10 MG tablet Take 10 mg by mouth in the morning.   aspirin EC 81 MG tablet Take 1 tablet (81 mg total) by mouth daily.   atorvastatin (LIPITOR) 80 MG tablet Take 1 tablet (80 mg total) by mouth daily at 6 PM.   blood glucose meter kit and supplies KIT Dispense based on patient and  insurance preference. Use up to four times daily as directed.   cholecalciferol (VITAMIN D) 1000 units tablet Take 3 tablets (3,000 Units total) by mouth daily.   diltiazem (TIAZAC) 180 MG 24 hr capsule Take 2 capsules (360 mg total) by mouth daily. (Patient taking differently: Take 360 mg by mouth every evening.)   ezetimibe (ZETIA) 10 MG tablet Take 1 tablet by mouth once daily   famotidine (PEPCID) 20 MG tablet Take 20 mg by mouth in the morning and at bedtime.   levothyroxine (SYNTHROID, LEVOTHROID) 75 MCG tablet Take 1 tablet (75 mcg total) by mouth daily before breakfast.   magnesium oxide (MAG-OX) 400 (240 Mg) MG tablet Take 400 mg by mouth in the morning.   magnesium oxide (MAG-OX) 400 MG tablet Take 1.5 tablets (600 mg total) by mouth 2 (two) times daily. (Patient taking differently: Take 400 mg by mouth daily.)   metFORMIN (GLUCOPHAGE) 500 MG tablet Take by mouth 2 (two) times daily with a meal.   mycophenolate (CELLCEPT) 500 MG tablet Take 1 tablet (500 mg total) by mouth 2 (two) times daily.   tacrolimus (PROGRAF) 0.5 MG capsule Take 2 capsules (1 mg total) by mouth 2 (two) times daily. (Patient taking differently: Take 0.5-1 mg by mouth See admin instructions. Take 2 capsules (1 mg) by mouth in the morning & take 1 capsule (0.5 mg) by mouth in the evening.)   [DISCONTINUED] doxycycline (VIBRA-TABS) 100 MG tablet Take 100 mg by  mouth 2 (two) times daily.   No facility-administered encounter medications on file as of 07/19/2023.    Allergies (verified) Patient has no known allergies.   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   on old heart   Blood transfusion without reported diagnosis    CHF (congestive heart failure) (HCC)    Chronic kidney disease    CORONARY ARTERY DISEASE  04/25/2007   DIABETES MELLITUS, TYPE II 12/13/2008   no meds, diet contolled   GERD (gastroesophageal reflux disease)    HYPERLIPIDEMIA 04/25/2007   Hypertension    Hypothyroidism    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   MYOCARDIAL INFARCTION, 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   OBESITY 02/26/2009   THYROID DISEASE, HX OF 05/14/2008   VENTRICULAR TACHYCARDIA 02/26/2009   Past Surgical History:  Procedure Laterality Date   ANGIOPLASTY     with stent   CARDIAC ASSIST DEVICE REMOVAL     CARDIAC CATHETERIZATION     COLONOSCOPY     CORONARY ARTERY BYPASS GRAFT     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   LEFT VENTRICULAR ASSIST DEVICE  05/2009   [facility]   TRANSURETHRAL RESECTION OF PROSTATE N/A 06/21/2023   Procedure: TRANSURETHRAL RESECTION OF THE PROSTATE (TURP);  Surgeon: Noel Christmas, MD;  Location: [facility];  Service: Urology;  Laterality: N/A;  60 MINUTES   Family History  Problem Relation Age of Onset   Heart disease Mother    Lung cancer Maternal Grandmother    Lung cancer Maternal Grandfather    Colon cancer Neg Hx    Esophageal cancer Neg Hx    Prostate cancer Neg Hx    Rectal cancer Neg Hx    Stomach cancer Neg Hx    Social History   Socioeconomic History   Marital status: Divorced    Spouse name: Not on file   Number of children: 1   Years of education: Not on file   Highest education level: Not on file  Occupational History   Not on file  Tobacco Use   Smoking status: Former    Current packs/day: 0.00    Average packs/day: 1 pack/day for 20.0 years (20.0 ttl pk-yrs)    Types: Cigarettes    Start date: 07/02/1979    Quit date: 07/02/1999    Years since quitting: 24.0   Smokeless tobacco: Never  Vaping Use   Vaping status: Never Used  Substance and Sexual Activity   Alcohol use: No    Comment: once yearly   Drug use: No   Sexual activity: Not on file  Other Topics Concern   Not on file  Social History Narrative   Single. Divorced. 1 son. 1 granddaughter 2014.        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       Hobbies: time on CPU            Social Determinants of Health   Financial Resource Strain: Low Risk  (07/19/2023)   Overall Financial Resource Strain (CARDIA)    Difficulty of Paying Living Expenses: Not hard at all  Food Insecurity: No Food Insecurity (07/19/2023)   Hunger Vital Sign    Worried About Running Out of Food in the Last Year: Never true    Ran Out of Food in the Last Year: Never true  Transportation  Needs: No Transportation Needs (07/19/2023)   PRAPARE - Administrator, Civil Service (Medical): No    Lack of Transportation (Non-Medical): No  Physical Activity: Insufficiently Active (07/19/2023)   Exercise Vital Sign    Days of Exercise per Week: 1 day    Minutes of Exercise per Session: 30 min  Stress: No Stress Concern Present (07/19/2023)   [employer] of Occupational Health - Occupational Stress Questionnaire    Feeling of Stress : Not at all  Social Connections: Moderately Isolated (07/19/2023)   Social Connection and Isolation Panel [NHANES]    Frequency of Communication with Friends and Family: Twice a week    Frequency of Social Gatherings with Friends and Family: More than three times a week    Attends Religious Services: More than 4 times per year    Active Member of [employer] or Organizations: No    Attends Engineer, structural: Never    Marital Status: Divorced    Tobacco Counseling Counseling given: Not Answered   Clinical Intake:  Pre-visit preparation completed: Yes  Pain : No/denies pain     BMI - recorded: 30.41 Nutritional Status: BMI > 30  Obese Nutritional Risks: None Diabetes: Yes CBG done?: Yes (185 per pt) CBG resulted in Enter/ Edit results?: No Did pt. bring in CBG monitor from home?: No  How often do you need to have someone help you when you read instructions, pamphlets, or other written materials from your doctor or pharmacy?: 1 -  Never  Interpreter Needed?: No  Information entered by :: Lanier Ensign, LPN   Activities of Daily Living    07/19/2023   11:35 AM 06/21/2023   12:37 PM  In your present state of health, do you have any difficulty performing the following activities:  Hearing? 1 1  Comment hearing aids   Vision? 0 0  Difficulty concentrating or making decisions? 0 0  Walking or climbing stairs? 0   Dressing or bathing? 0   Doing errands, shopping? 0 0  Preparing Food and eating ? N   Using the Toilet? N   In the past six months, have you accidently leaked urine? Y   Comment at times   Do you have problems with loss of bowel control? N   Managing your Medications? N   Managing your Finances? N   Housekeeping or managing your Housekeeping? N     Patient Care Team: Shelva Majestic, MD as PCP - General (Family Medicine) Marlaine Hind, MD as Consulting Physician (Cardiology) Erroll Luna, [facility] (Inactive) (Pharmacist)  Indicate any recent Medical Services you may have received from other than [facility] providers in the past year (date may be approximate).     Assessment:   This is a routine wellness examination for Toddrick.  Hearing/Vision screen Hearing Screening - Comments:: Pt has hearing aids  Vision Screening - Comments:: Pt follows up with Dr Isac Caddy [facility]    Goals Addressed             This Visit's Progress    Patient Stated       Get more exercise and lose weight        Depression Screen    07/19/2023   11:39 AM 01/06/2023   10:00 AM 07/13/2022    2:50 PM 06/30/2022   11:02 AM 06/05/2021    2:50 PM 06/05/2021    2:44 PM 12/17/2020   10:40 AM  PHQ 2/9 Scores  PHQ - 2 Score 0  0 0 0 0 0 0  PHQ- 9 Score  3     1    Fall Risk    07/19/2023   11:40 AM 01/06/2023   10:00 AM 07/13/2022    2:53 PM 06/05/2021    2:50 PM 12/17/2020   10:36 AM  Fall Risk   Falls in the past year? 1 0 0 1 1  Number falls in past yr: 1 0 0 0 1  Injury with Fall? 0 0 0 0 0   Risk for fall due to : Impaired vision;History of fall(s) No Fall Risks No Fall Risks No Fall Risks   Follow up Falls prevention discussed Falls evaluation completed Falls prevention discussed      MEDICARE RISK AT HOME: Medicare Risk at Home Any stairs in or around the home?: Yes If so, are there any without handrails?: No Home free of loose throw rugs in walkways, pet beds, electrical cords, etc?: Yes Adequate lighting in your home to reduce risk of falls?: Yes Life alert?: No Use of a cane, walker or w/c?: No Grab bars in the bathroom?: No Shower chair or bench in shower?: No Elevated toilet seat or a handicapped toilet?: No  TIMED UP AND GO:  Was the test performed?  No    Cognitive Function:        07/19/2023   11:43 AM 07/13/2022    2:54 PM 06/05/2021    2:51 PM  6CIT Screen  What Year? 0 points 0 points 0 points  What month? 0 points 0 points 0 points  What time? 0 points 0 points 0 points  Count back from 20 0 points 0 points 0 points  Months in reverse 0 points 0 points 0 points  Repeat phrase 2 points 0 points 0 points  Total Score 2 points 0 points 0 points    Immunizations Immunization History  Administered Date(s) Administered   Fluad Quad(high Dose 65+) 06/30/2022   Fluad Trivalent(High Dose 65+) 06/22/2023   HIB (PRP-OMP) 05/02/2009   Hepatitis A, Adult 06/09/2012   Hepatitis B 09/14/2011   Hepatitis B, ADULT 05/08/2009, 09/14/2011, 06/09/2012   Influenza Split 07/02/2011   Influenza Whole 06/25/2008   Influenza, Seasonal, Injecte, Preservative Fre 08/06/2010, 06/09/2012   Influenza,inj,Quad PF,6+ Mos 08/07/2013, 06/19/2014, 08/29/2015, 08/20/2016, 06/07/2018, 05/22/2019, 06/16/2020, 06/02/2021   Influenza-Unspecified 06/19/2014, 06/16/2020, 06/30/2022   PFIZER(Purple Top)SARS-COV-2 Vaccination 11/04/2019, 12/01/2019, 10/22/2020   PNEUMOCOCCAL CONJUGATE-20 09/15/2022   PPD Test 05/04/2009, 09/14/2011, 06/09/2012   Pfizer Covid-19 Vaccine Bivalent  Booster 70yrs & up 06/16/2022   Pneumococcal Conjugate-13 07/18/2012   Pneumococcal Polysaccharide-23 09/06/2009, 06/09/2012, 02/23/2019   Pneumococcal-Unspecified 05/02/2009   Td 05/02/2009   Tdap 09/14/2011, 05/12/2022   Zoster Recombinant(Shingrix) 09/15/2022, 12/10/2022   Zoster, Live 12/16/2006    TDAP status: Due, Education has been provided regarding the importance of this vaccine. Advised may receive this vaccine at local pharmacy or Health Dept. Aware to provide a copy of the vaccination record if obtained from local pharmacy or Health Dept. Verbalized acceptance and understanding.  Flu Vaccine status: Up to date  Pneumococcal vaccine status: Up to date  Covid-19 vaccine status: Information provided on how to obtain vaccines.   Qualifies for Shingles Vaccine? Yes   Zostavax completed Yes   Shingrix Completed?: Yes  Screening Tests Health Maintenance  Topic Date Due   OPHTHALMOLOGY EXAM  06/20/2021   COVID-19 Vaccine (5 - 2023-24 season) 05/08/2023   HEMOGLOBIN A1C  11/17/2023   FOOT EXAM  01/06/2024   Diabetic  kidney evaluation - eGFR measurement  05/19/2024   Diabetic kidney evaluation - Urine ACR  05/19/2024   Medicare Annual Wellness (AWV)  07/18/2024   Colonoscopy  04/26/2027   Pneumonia Vaccine 31+ Years old  Completed   INFLUENZA VACCINE  Completed   Zoster Vaccines- Shingrix  Completed   Hepatitis C Screening  Addressed   HPV VACCINES  Aged Out   DTaP/Tdap/Td  Discontinued    Health Maintenance  Health Maintenance Due  Topic Date Due   OPHTHALMOLOGY EXAM  06/20/2021   COVID-19 Vaccine (5 - 2023-24 season) 05/08/2023    Colorectal cancer screening: Type of screening: Colonoscopy. Completed 04/25/17. Repeat every 10 years   Additional Screening:  Hepatitis C Screening:  Completed 08/29/15  Vision Screening: Recommended annual ophthalmology exams for early detection of glaucoma and other disorders of the eye. Is the patient up to date with their  annual eye exam?  No  Who is the provider or what is the name of the office in which the patient attends annual eye exams? [facility] If pt is not established with a provider, would they like to be referred to a provider to establish care? No .   Dental Screening: Recommended annual dental exams for proper oral hygiene  Diabetic Foot Exam: Diabetic Foot Exam: Completed 01/06/23  Community Resource Referral / Chronic Care Management: CRR required this visit?  No   CCM required this visit?  No     Plan:     I have personally reviewed and noted the following in the patient's chart:   Medical and social history Use of alcohol, tobacco or illicit drugs  Current medications and supplements including opioid prescriptions. Patient is not currently taking opioid prescriptions. Functional ability and status Nutritional status Physical activity Advanced directives List of other physicians Hospitalizations, surgeries, and ER visits in previous 12 months Vitals Screenings to include cognitive, depression, and falls Referrals and appointments  In addition, I have reviewed and discussed with patient certain preventive protocols, quality metrics, and best practice recommendations. A written personalized care plan for preventive services as well as general preventive health recommendations were provided to patient.     Marzella Schlein, LPN   [id number]   After Visit Summary: (MyChart) Due to this being a telephonic visit, the after visit summary with patients personalized plan was offered to patient via MyChart   Nurse Notes: none

## 2023-07-19 NOTE — Patient Instructions (Signed)
[name] , Thank you for taking time to come for your Medicare Wellness Visit. I appreciate your ongoing commitment to your health goals. Please review the following plan we discussed and let me know if I can assist you in the future.   Referrals/Orders/Follow-Ups/Clinician Recommendations: Aim for 30 minutes of exercise or brisk walking, 6-8 glasses of water, and 5 servings of fruits and vegetables each day. Continue to work on weight losing weight   This is a list of the screening recommended for you and due dates:  Health Maintenance  Topic Date Due   Eye exam for diabetics  06/20/2021   COVID-19 Vaccine (5 - 2023-24 season) 05/08/2023   Medicare Annual Wellness Visit  07/14/2023   Hemoglobin A1C  11/17/2023   Complete foot exam   01/06/2024   Yearly kidney function blood test for diabetes  05/19/2024   Yearly kidney health urinalysis for diabetes  05/19/2024   Colon Cancer Screening  04/26/2027   Pneumonia Vaccine  Completed   Flu Shot  Completed   Zoster (Shingles) Vaccine  Completed   Hepatitis C Screening  Addressed   HPV Vaccine  Aged Out   DTaP/Tdap/Td vaccine  Discontinued    Advanced directives: (Declined) Advance directive discussed with you today. Even though you declined this today, please call our office should you change your mind, and we can give you the proper paperwork for you to fill out.  Next Medicare Annual Wellness Visit scheduled for next year: Yes

## 2023-07-21 ENCOUNTER — Encounter: Payer: Self-pay | Admitting: Family Medicine

## 2023-07-21 ENCOUNTER — Ambulatory Visit: Payer: Medicare HMO | Admitting: Family Medicine

## 2023-07-21 VITALS — BP 150/60 | HR 91 | Temp 97.3°F | Ht 68.0 in | Wt 202.4 lb

## 2023-07-21 DIAGNOSIS — E119 Type 2 diabetes mellitus without complications: Secondary | ICD-10-CM

## 2023-07-21 DIAGNOSIS — M25571 Pain in right ankle and joints of right foot: Secondary | ICD-10-CM

## 2023-07-21 DIAGNOSIS — Z7984 Long term (current) use of oral hypoglycemic drugs: Secondary | ICD-10-CM

## 2023-07-21 DIAGNOSIS — I5022 Chronic systolic (congestive) heart failure: Secondary | ICD-10-CM

## 2023-07-21 DIAGNOSIS — I10 Essential (primary) hypertension: Secondary | ICD-10-CM | POA: Diagnosis not present

## 2023-07-21 MED ORDER — VALSARTAN 320 MG PO TABS: 320.0000 mg | ORAL_TABLET | Freq: Every day | ORAL | 3 refills | Status: DC

## 2023-07-21 MED ORDER — METFORMIN HCL 500 MG PO TABS: 500.0000 mg | ORAL_TABLET | Freq: Three times a day (TID) | ORAL | 3 refills | Status: DC

## 2023-07-21 NOTE — Progress Notes (Signed)
Phone [phone] In person visit   Subjective:   [name] is a 67 y.o. year old very pleasant male patient who presents for/with See problem oriented charting Chief Complaint  Patient presents with   Medical Management of Chronic Issues   Diabetes    Sign ROI   Past Medical History-  Patient Active Problem List   Diagnosis Date Noted   Immunosuppressed status (HCC) 02/23/2019    Priority: High   Heart transplanted (HCC) 06/19/2014    Priority: High   Chronic systolic heart failure (HCC) 03/02/2009    Priority: High   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    Priority: High   AUTOMATIC IMPLANTABLE CARDIAC DEFIBRILLATOR SITU 02/26/2009    Priority: High   Diabetes mellitus type II, controlled (HCC) 12/13/2008    Priority: High   Coronary atherosclerosis 04/25/2007    Priority: High   Chronic abdominal pain 12/27/2015    Priority: Medium    Benign prostatic hyperplasia 02/26/2014    Priority: Medium    Hypothyroidism 05/14/2008    Priority: Medium    Essential hypertension 05/14/2008    Priority: Medium    ANEMIA, OTHER UNSPEC 04/03/2008    Priority: Medium    Hyperlipidemia associated with type 2 diabetes mellitus (HCC) 04/25/2007    Priority: Medium    GERD (gastroesophageal reflux disease) 06/19/2014    Priority: Low   Osteoarthritis 06/19/2014    Priority: Low   History of adenomatous polyp of colon 09/17/2013    Priority: Low   BPH with obstruction/lower urinary tract symptoms 06/21/2023   Nausea and vomiting 05/18/2023    Medications- reviewed and updated Current Outpatient Medications  Medication Sig Dispense Refill   ACCU-CHEK GUIDE test strip USE 1 STRIP 4 TIMES DAILY 100 each 0   amLODipine (NORVASC) 10 MG tablet Take 10 mg by mouth in the morning.     aspirin EC 81 MG tablet Take 1 tablet (81 mg total) by mouth daily. 30 tablet 0   atorvastatin (LIPITOR) 80 MG tablet Take 1 tablet (80 mg total) by mouth daily at 6 PM. 30 tablet 0   blood  glucose meter kit and supplies KIT Dispense based on patient and insurance preference. Use up to four times daily as directed. 1 each 0   cholecalciferol (VITAMIN D) 1000 units tablet Take 3 tablets (3,000 Units total) by mouth daily. 90 tablet 0   diltiazem (TIAZAC) 180 MG 24 hr capsule Take 2 capsules (360 mg total) by mouth daily. (Patient taking differently: Take 360 mg by mouth every evening.) 60 capsule 0   ezetimibe (ZETIA) 10 MG tablet Take 1 tablet by mouth once daily 90 tablet 0   famotidine (PEPCID) 20 MG tablet Take 20 mg by mouth in the morning and at bedtime.     levothyroxine (SYNTHROID, LEVOTHROID) 75 MCG tablet Take 1 tablet (75 mcg total) by mouth daily before breakfast. 30 tablet 0   magnesium oxide (MAG-OX) 400 (240 Mg) MG tablet Take 400 mg by mouth in the morning.     magnesium oxide (MAG-OX) 400 MG tablet Take 1.5 tablets (600 mg total) by mouth 2 (two) times daily. (Patient taking differently: Take 400 mg by mouth daily.) 45 tablet 0   metFORMIN (GLUCOPHAGE) 500 MG tablet Take by mouth 2 (two) times daily with a meal.     mycophenolate (CELLCEPT) 500 MG tablet Take 1 tablet (500 mg total) by mouth 2 (two) times daily. 60 tablet 0   tacrolimus (PROGRAF) 0.5 MG capsule Take  2 capsules (1 mg total) by mouth 2 (two) times daily. (Patient taking differently: Take 0.5-1 mg by mouth See admin instructions. Take 2 capsules (1 mg) by mouth in the morning & take 1 capsule (0.5 mg) by mouth in the evening.) 120 capsule 0   No current facility-administered medications for this visit.     Objective:  BP (!) 150/60   Pulse 91   Temp (!) 97.3 F (36.3 C)   Ht 5\' 8"  (1.727 m)   Wt 202 lb 6.4 oz (91.8 kg)   SpO2 98%   BMI 30.77 kg/m  Gen: NAD, resting comfortably CV: RRR no murmurs rubs or gallops Lungs: CTAB no crackles, wheeze, rhonchi Ext: trace  edema Skin: warm, dry     Assessment and Plan    #Prior significant nausea and dry heaves with very low blood pressure- this  has resolved and weight has stabilized. He did well with TURP procedure- they were able to get cath out.  - he did nto see gastroenterology since nausea issues resolved - weight stable from last visit - since he is better wants to hold off on stool cards  -in retrospect feels like 2nd tamsulosin was issue- when he stopped that did better - Had him hold chlorthalidone, valsartan, metformin- he is back on metformin and doing ok- we will restart valsartan for high blood pressure as well   #Left ankle pain- possible foreign body on x-ray with urgent care on RIGHT- he has not seen orthopedic for this- will refer to Dr. Victorino Dike- he is very tender under the LEFT medial malleolus though- will ask Dr. Victorino Dike opinion for both.   # Diabetes S: Medication:Metformin 500 mg twice daily - we had stopped this but he added this back in with sugars going up- I told him my concern is still his kidney function . We looked at [facility] labs and creatinine down to 1.33 from 1.78 when we checked with GFR around high 50's  CBGs- restarted metformin a few weeks ago- most of his sugars are in 130s-150's but some outliers such as 209/ 187, 185, 176 but did just come off of TURP surgery in last month- activity may be down some  -was given prednisone in early October for his ankle pain but has been off for some time now- but still having pain A/P: kidney function improved - think we can trial metformin 500 mg three times a day- he needs to let me know if recurrent nausea issues or weight loss as soon as possible    Also wonder if ankle pain contributing  #CHF  #hypertension S: medication:amlodipine 10 mg, diltiazem 360 mg extended release -Lasix usage:not needing  Home readings #s: home blood pressure typically 140's or 150's since stopping the valsartan 320 mg and chlorthalidone 25 mg -weights largely stable from last visit but over last 2 weeks down about 6 lbs- he wonders if weight is going down with sugars high though BP  Readings from Last 3 Encounters:  07/21/23 (!) 150/60  07/13/23 109/68  06/22/23 (!) 142/77  A/P: CHF does not appear fluid overloaded- continue current medications  Hypertension- above goal- we opted to restart valsartan 320 mg and he will update me in 1-2 weeks with home blood pressure.    #BPH with recent TURP S: Medication: Tamsulosin 0.4 mg  A/P: has noted some improvement- continue to monitor and keep urology follow up     # pulmonary nodule noted on 07/01/22 with 1 year repeat per [facility] -  reports planned for ferbruary with [facility]   Recommended follow up: Return in about 4 months (around 11/18/2023) for followup or sooner if needed.Schedule b4 you leave.  Lab/Order associations:   ICD-10-CM   1. Acute right ankle pain  M25.571 Ambulatory referral to Orthopedic Surgery    2. Controlled type 2 diabetes mellitus without complication, without long-term current use of insulin (HCC)  E11.9     3. Chronic systolic heart failure (HCC)  I69.62     4. Essential hypertension  I10       Meds ordered this encounter  Medications   metFORMIN (GLUCOPHAGE) 500 MG tablet    Sig: Take 1 tablet (500 mg total) by mouth with breakfast, with lunch, and with evening meal.    Dispense:  270 tablet    Refill:  3   valsartan (DIOVAN) 320 MG tablet    Sig: Take 1 tablet (320 mg total) by mouth daily.    Dispense:  90 tablet    Refill:  3    Return precautions advised.  Tana Conch, MD

## 2023-07-21 NOTE — Patient Instructions (Addendum)
Sign release of information at the check out desk for diabetic eye exam.  Hypertension- above goal- we opted to restart valsartan 320 mg and he will update me in 1-2 weeks with home blood pressure    kidney function improved - think we can trial metformin 500 mg three times a day- he needs to let me know if recurrent nausea issues or weight loss as soon as possible     We have placed a referral for you today to [facility] with [facility]. In some cases you will see # listed below- you can call this if you have not heard within a week. If you do not see # listed- you should receive a mychart message or phone call within a week with the # to call directly- call that as soon as you get it. If you are having issues getting scheduled reach out to [place] again.    Recommended follow up: Return in about 4 months (around 11/18/2023) for followup or sooner if needed.Schedule b4 you leave.  Could also schedule physical 4 months after that

## 2023-07-25 DIAGNOSIS — M25572 Pain in left ankle and joints of left foot: Secondary | ICD-10-CM | POA: Diagnosis not present

## 2023-07-25 DIAGNOSIS — M79672 Pain in left foot: Secondary | ICD-10-CM | POA: Insufficient documentation

## 2023-07-26 DIAGNOSIS — R600 Localized edema: Secondary | ICD-10-CM | POA: Diagnosis not present

## 2023-07-27 ENCOUNTER — Other Ambulatory Visit: Payer: Self-pay | Admitting: Family Medicine

## 2023-07-27 DIAGNOSIS — R7309 Other abnormal glucose: Secondary | ICD-10-CM

## 2023-08-01 DIAGNOSIS — Z941 Heart transplant status: Principal | ICD-10-CM

## 2023-08-01 MED ORDER — ASPIRIN 81 MG TABLET,DELAYED RELEASE
ORAL_TABLET | Freq: Every day | ORAL | 3 refills | 90 days | Status: CP
Start: 2023-08-01 — End: ?
  Filled 2023-08-09: qty 90, 90d supply, fill #0

## 2023-08-01 MED ORDER — MYCOPHENOLATE MOFETIL 500 MG TABLET
ORAL_TABLET | Freq: Two times a day (BID) | ORAL | 3 refills | 90 days | Status: CP
Start: 2023-08-01 — End: 2024-07-31
  Filled 2023-08-09: qty 180, 90d supply, fill #0

## 2023-08-01 MED ORDER — DILTIAZEM CD 180 MG CAPSULE,EXTENDED RELEASE 24 HR
ORAL_CAPSULE | Freq: Every day | ORAL | 11 refills | 30 days | Status: CP
Start: 2023-08-01 — End: ?
  Filled 2023-08-09: qty 60, 30d supply, fill #0

## 2023-08-01 MED ORDER — FAMOTIDINE 20 MG TABLET
ORAL_TABLET | Freq: Two times a day (BID) | ORAL | 3 refills | 90 days | Status: CP
Start: 2023-08-01 — End: 2024-07-31
  Filled 2023-08-09: qty 180, 90d supply, fill #0

## 2023-08-01 MED ORDER — AMLODIPINE 10 MG TABLET
ORAL_TABLET | Freq: Every day | ORAL | 3 refills | 90 days | Status: CP
Start: 2023-08-01 — End: 2024-07-31
  Filled 2023-08-09: qty 90, 90d supply, fill #0

## 2023-08-01 MED ORDER — LEVOTHYROXINE 75 MCG TABLET
ORAL_TABLET | Freq: Every day | ORAL | 11 refills | 30 days | Status: CP
Start: 2023-08-01 — End: 2024-07-31
  Filled 2023-08-09: qty 30, 30d supply, fill #0

## 2023-08-01 NOTE — Unmapped (Signed)
[facility] Refill Coordination Note    Specialty Medication(s) to be Shipped:   Transplant: mycophenolate mofetil 500mg  and tacrolimus 0.5mg     Other medication(s) to be shipped:  diltiazem 180mg , levothyroxine 75 mcg, aspirin 81mg , atorvastatin 80mg , famotidine 20mg      Andrew Dickerson, DOB: August 25, 1956  Phone: There are no phone numbers on file.      All above HIPAA information was verified with patient.     Was a Nurse, learning disability used for this call? No    Completed refill call assessment today to schedule patient's medication shipment from the [facility]  [phone]).  All relevant notes have been reviewed.     Specialty medication(s) and dose(s) confirmed: Regimen is correct and unchanged.   Changes to medications: Darryn reports no changes at this time.  Changes to insurance: No  New side effects reported not previously addressed with a pharmacist or physician: None reported  Questions for the pharmacist: No    Confirmed patient received a Conservation officer, historic buildings and a Surveyor, mining with first shipment. The patient will receive a drug information handout for each medication shipped and additional FDA Medication Guides as required.       DISEASE/MEDICATION-SPECIFIC INFORMATION        N/A    SPECIALTY MEDICATION ADHERENCE     Medication Adherence    Patient reported X missed doses in the last month: 2  Specialty Medication: tacrolimus 0.5 MG capsule (PROGRAF)  Patient is on additional specialty medications: Yes  Additional Specialty Medications: Mycophenolate 500mg  BID  Patient Reported Additional Medication X Missed Doses in the Last Month: 2  Patient is on more than two specialty medications: No  Any gaps in refill history greater than 2 weeks in the last 3 months: no  Demonstrates understanding of importance of adherence: yes  Informant: patient          Were doses missed due to medication being on hold? No    tacrolimus 0.5 mg: 14 days of medicine on hand   mycophenolate 500 mg: 14 days of medicine on hand     REFERRAL TO PHARMACIST     Referral to the pharmacist: Not needed      SHIPPING     Shipping address confirmed in Epic.       Delivery Scheduled: Yes, Expected medication delivery date: 08/09/23.     Medication will be delivered via UPS to the prescription address in Epic WAM.    Oliva Bustard, PharmD   [facility]  Specialty Pharmacist

## 2023-08-02 DIAGNOSIS — R338 Other retention of urine: Secondary | ICD-10-CM | POA: Diagnosis not present

## 2023-08-03 NOTE — ED Provider Notes (Signed)
[facility]    CSN: [id number] Arrival date & time: 07/13/23  1344      History   Chief Complaint No chief complaint on file.   HPI [name] is a 67 y.o. male.   Patient here today for evaluation of left foot pain that started last night.  He reports that movement worsens pain.  He has not had any treatment for symptoms.  He denies any injury.  The history is provided by the patient.    Past Medical History:  Diagnosis Date   Abdominal pain, epigastric 03/31/2009   Acute on chronic systolic heart failure (HCC) 03/02/2009   ANEMIA, OTHER UNSPEC 04/03/2008   AUTOMATIC IMPLANTABLE CARDIAC DEFIBRILLATOR SITU 02/26/2009   on old heart   Blood transfusion without reported diagnosis    CHF (congestive heart failure) (HCC)    Chronic kidney disease    CORONARY ARTERY DISEASE 04/25/2007   DIABETES MELLITUS, TYPE II 12/13/2008   no meds, diet contolled   GERD (gastroesophageal reflux disease)    HYPERLIPIDEMIA 04/25/2007   Hypertension    Hypothyroidism    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   MYOCARDIAL INFARCTION, 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   OBESITY 02/26/2009   THYROID DISEASE, HX OF 05/14/2008   VENTRICULAR TACHYCARDIA 02/26/2009    Patient Active Problem List   Diagnosis Date Noted   BPH with obstruction/lower urinary tract symptoms 06/21/2023   Nausea and vomiting 05/18/2023   Immunosuppressed status (HCC) 02/23/2019   Chronic abdominal pain 12/27/2015   Heart transplanted (HCC) 06/19/2014   GERD (gastroesophageal reflux disease) 06/19/2014   Osteoarthritis 06/19/2014   Benign prostatic hyperplasia 02/26/2014   History of adenomatous polyp of colon 09/17/2013   Chronic systolic heart failure (HCC) 03/02/2009   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   AUTOMATIC IMPLANTABLE CARDIAC DEFIBRILLATOR SITU 02/26/2009   Diabetes mellitus type II, controlled (HCC) 12/13/2008   Hypothyroidism 05/14/2008   Essential hypertension 05/14/2008   ANEMIA, OTHER UNSPEC  04/03/2008   Hyperlipidemia associated with type 2 diabetes mellitus (HCC) 04/25/2007   Coronary atherosclerosis 04/25/2007    Past Surgical History:  Procedure Laterality Date   ANGIOPLASTY     with stent   CARDIAC ASSIST DEVICE REMOVAL     CARDIAC CATHETERIZATION     COLONOSCOPY     CORONARY ARTERY BYPASS GRAFT     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   LEFT VENTRICULAR ASSIST DEVICE  05/2009   [facility]   TRANSURETHRAL RESECTION OF PROSTATE N/A 06/21/2023   Procedure: TRANSURETHRAL RESECTION OF THE PROSTATE (TURP);  Surgeon: Noel Christmas, MD;  Location: [facility];  Service: Urology;  Laterality: N/A;  60 MINUTES       Home Medications    Prior to Admission medications   Medication Sig Start Date End Date Taking? Authorizing Provider  ACCU-CHEK GUIDE test strip USE 1 STRIP 4 TIMES DAILY 06/21/23   Shelva Majestic, MD  amLODipine (NORVASC) 10 MG tablet Take 10 mg by mouth in the morning.    [provider]  aspirin EC 81 MG tablet Take 1 tablet (81 mg total) by mouth daily. 01/25/17   Shelva Majestic, MD  atorvastatin (LIPITOR) 80 MG tablet Take 1 tablet (80 mg total) by mouth daily at 6 PM. 01/25/17   Shelva Majestic, MD  blood glucose meter kit and supplies KIT Dispense based on patient and insurance preference. Use up to four times daily as directed. 06/30/22   Shelva Majestic, MD  cholecalciferol (VITAMIN D) 1000 units tablet Take  3 tablets (3,000 Units total) by mouth daily. 01/25/17   Shelva Majestic, MD  diltiazem [facility]) 180 MG 24 hr capsule Take 2 capsules (360 mg total) by mouth daily. Patient taking differently: Take 360 mg by mouth every evening. 01/25/17   Shelva Majestic, MD  ezetimibe (ZETIA) 10 MG tablet Take 1 tablet by mouth once daily 04/25/23   Shelva Majestic, MD  famotidine (PEPCID) 20 MG tablet Take 20 mg by mouth in the morning and at bedtime. 09/03/19 06/01/27  [provider]  levothyroxine (SYNTHROID, LEVOTHROID) 75 MCG tablet Take 1  tablet (75 mcg total) by mouth daily before breakfast. 01/25/17   Shelva Majestic, MD  magnesium oxide (MAG-OX) 400 (240 Mg) MG tablet Take 400 mg by mouth in the morning.    [provider]  magnesium oxide (MAG-OX) 400 MG tablet Take 1.5 tablets (600 mg total) by mouth 2 (two) times daily. Patient taking differently: Take 400 mg by mouth daily. 01/25/17   Shelva Majestic, MD  metFORMIN (GLUCOPHAGE) 1000 MG tablet TAKE 1 TABLET BY MOUTH TWICE DAILY WITH MEALS 07/27/23   Shelva Majestic, MD  metFORMIN (GLUCOPHAGE) 500 MG tablet Take 1 tablet (500 mg total) by mouth with breakfast, with lunch, and with evening meal. 07/21/23   Shelva Majestic, MD  mycophenolate (CELLCEPT) 500 MG tablet Take 1 tablet (500 mg total) by mouth 2 (two) times daily. 01/25/17   Shelva Majestic, MD  tacrolimus (PROGRAF) 0.5 MG capsule Take 2 capsules (1 mg total) by mouth 2 (two) times daily. Patient taking differently: Take 0.5-1 mg by mouth See admin instructions. Take 2 capsules (1 mg) by mouth in the morning & take 1 capsule (0.5 mg) by mouth in the evening. 01/25/17   Shelva Majestic, MD  valsartan (DIOVAN) 320 MG tablet Take 1 tablet (320 mg total) by mouth daily. 07/21/23   Shelva Majestic, MD    Family History Family History  Problem Relation Age of Onset   Heart disease Mother    Lung cancer Maternal Grandmother    Lung cancer Maternal Grandfather    Colon cancer Neg Hx    Esophageal cancer Neg Hx    Prostate cancer Neg Hx    Rectal cancer Neg Hx    Stomach cancer Neg Hx     Social History Social History   Tobacco Use   Smoking status: Former    Current packs/day: 0.00    Average packs/day: 1 pack/day for 20.0 years (20.0 ttl pk-yrs)    Types: Cigarettes    Start date: 07/02/1979    Quit date: 07/02/1999    Years since quitting: 24.1   Smokeless tobacco: Never  Vaping Use   Vaping status: Never Used  Substance Use Topics   Alcohol use: No    Comment: once yearly   Drug  use: No     Allergies   Patient has no known allergies.   Review of Systems Review of Systems  Constitutional:  Negative for chills and fever.  Eyes:  Negative for discharge and redness.  Musculoskeletal:  Positive for arthralgias.  Skin:  Negative for color change and wound.  Neurological:  Negative for numbness.     Physical Exam Triage Vital Signs ED Triage Vitals  Encounter Vitals Group     BP 07/13/23 1456 109/68     Systolic BP Percentile --      Diastolic BP Percentile --      Pulse Rate 07/13/23 1456  98     Resp 07/13/23 1456 18     Temp 07/13/23 1456 97.9 F (36.6 C)     Temp Source 07/13/23 1456 Oral     SpO2 07/13/23 1456 98 %     Weight 07/13/23 1455 207 lb 12.8 oz (94.3 kg)     Height 07/13/23 1455 5\' 8"  (1.727 m)     Head Circumference --      Peak Flow --      Pain Score 07/13/23 1455 5     Pain Loc --      Pain Education --      Exclude from Growth Chart --    No data found.  Updated Vital Signs BP 109/68 (BP Location: Left Arm)   Pulse 98   Temp 97.9 F (36.6 C) (Oral)   Resp 18   Ht 5\' 8"  (1.727 m)   Wt 207 lb 12.8 oz (94.3 kg)   SpO2 98%   BMI 31.60 kg/m   Visual Acuity Right Eye Distance:   Left Eye Distance:   Bilateral Distance:    Right Eye Near:   Left Eye Near:    Bilateral Near:     Physical Exam Vitals and nursing note reviewed.  Constitutional:      General: He is not in acute distress.    Appearance: Normal appearance. He is not ill-appearing.  HENT:     Head: Normocephalic and atraumatic.  Eyes:     Conjunctiva/sclera: Conjunctivae normal.  Cardiovascular:     Rate and Rhythm: Normal rate.  Pulmonary:     Effort: Pulmonary effort is normal. No respiratory distress.  Musculoskeletal:     Comments: No swelling appreciated to left foot  Neurological:     Mental Status: He is alert.  Psychiatric:        Mood and Affect: Mood normal.        Behavior: Behavior normal.        Thought Content: Thought content  normal.      UC Treatments / Results  Labs (all labs ordered are listed, but only abnormal results are displayed) Labs Reviewed - No data to display  EKG   Radiology No results found.  Procedures Procedures (including critical care time)  Medications Ordered in UC Medications - No data to display  Initial Impression / Assessment and Plan / UC Course  I have reviewed the triage vital signs and the nursing notes.  Pertinent labs & imaging results that were available during my care of the patient were reviewed by me and considered in my medical decision making (see chart for details).    Steroid burst prescribed.  Recommended follow-up with Ortho if no improvement or with any further concerns.  Final Clinical Impressions(s) / UC Diagnoses   Final diagnoses:  Left foot pain     Discharge Instructions       Follow up with ortho if symptoms persist or worsen.    ED Prescriptions     Medication Sig Dispense Auth. Provider   predniSONE (DELTASONE) 20 MG tablet Take 2 tablets (40 mg total) by mouth daily with breakfast for 5 days. 10 tablet Tomi Bamberger, PA-C      PDMP not reviewed this encounter.   Tomi Bamberger, PA-C 08/03/23 [phone]

## 2023-08-09 MED FILL — TACROLIMUS 0.5 MG CAPSULE, IMMEDIATE-RELEASE: ORAL | 30 days supply | Qty: 90 | Fill #3

## 2023-08-09 MED FILL — ATORVASTATIN 80 MG TABLET: ORAL | 90 days supply | Qty: 90 | Fill #2

## 2023-08-09 NOTE — Unmapped (Signed)
Andrew Dickerson 's entire shipment will be delayed as a result of no credit card on file to collect copayment.     I have reached out to the patient  via MyChart message and communicated the delay. We will wait for a call back from the patient to reschedule the delivery.  We have not confirmed the new delivery date.

## 2023-08-15 ENCOUNTER — Emergency Department (HOSPITAL_COMMUNITY)
Admission: EM | Admit: 2023-08-15 | Discharge: 2023-08-15 | Disposition: A | Discharge status: Home / Self Care | Payer: Medicare HMO | Attending: Emergency Medicine | Admitting: Emergency Medicine

## 2023-08-15 ENCOUNTER — Emergency Department (HOSPITAL_COMMUNITY): Payer: Medicare HMO

## 2023-08-15 ENCOUNTER — Other Ambulatory Visit: Payer: Self-pay

## 2023-08-15 ENCOUNTER — Encounter (HOSPITAL_COMMUNITY): Payer: Self-pay | Admitting: Emergency Medicine

## 2023-08-15 DIAGNOSIS — Z7984 Long term (current) use of oral hypoglycemic drugs: Secondary | ICD-10-CM | POA: Insufficient documentation

## 2023-08-15 DIAGNOSIS — M79672 Pain in left foot: Secondary | ICD-10-CM | POA: Diagnosis not present

## 2023-08-15 DIAGNOSIS — E119 Type 2 diabetes mellitus without complications: Secondary | ICD-10-CM | POA: Insufficient documentation

## 2023-08-15 DIAGNOSIS — Z950 Presence of cardiac pacemaker: Secondary | ICD-10-CM | POA: Diagnosis not present

## 2023-08-15 DIAGNOSIS — R600 Localized edema: Secondary | ICD-10-CM | POA: Insufficient documentation

## 2023-08-15 DIAGNOSIS — I2510 Atherosclerotic heart disease of native coronary artery without angina pectoris: Secondary | ICD-10-CM | POA: Insufficient documentation

## 2023-08-15 DIAGNOSIS — I509 Heart failure, unspecified: Secondary | ICD-10-CM | POA: Diagnosis not present

## 2023-08-15 DIAGNOSIS — Z7982 Long term (current) use of aspirin: Secondary | ICD-10-CM | POA: Insufficient documentation

## 2023-08-15 LAB — CBC WITH DIFFERENTIAL/PLATELET
Abs Immature Granulocytes: 0.02 10*3/uL (ref 0.00–0.07)
Basophils Absolute: 0.1 10*3/uL (ref 0.0–0.1)
Basophils Relative: 1 %
Eosinophils Absolute: 0.1 10*3/uL (ref 0.0–0.5)
Eosinophils Relative: 1 %
HCT: 37.1 % — ABNORMAL LOW (ref 39.0–52.0)
Hemoglobin: 11.7 g/dL — ABNORMAL LOW (ref 13.0–17.0)
Immature Granulocytes: 0 %
Lymphocytes Relative: 16 %
Lymphs Abs: 1.5 10*3/uL (ref 0.7–4.0)
MCH: 28.7 pg (ref 26.0–34.0)
MCHC: 31.5 g/dL (ref 30.0–36.0)
MCV: 91.2 fL (ref 80.0–100.0)
Monocytes Absolute: 0.7 10*3/uL (ref 0.1–1.0)
Monocytes Relative: 8 %
Neutro Abs: 6.8 10*3/uL (ref 1.7–7.7)
Neutrophils Relative %: 74 %
Platelets: 312 10*3/uL (ref 150–400)
RBC: 4.07 MIL/uL — ABNORMAL LOW (ref 4.22–5.81)
RDW: 14.2 % (ref 11.5–15.5)
WBC: 9.1 10*3/uL (ref 4.0–10.5)
nRBC: 0 % (ref 0.0–0.2)

## 2023-08-15 LAB — COMPREHENSIVE METABOLIC PANEL
ALT: 18 U/L (ref 0–44)
AST: 14 U/L — ABNORMAL LOW (ref 15–41)
Albumin: 4 g/dL (ref 3.5–5.0)
Alkaline Phosphatase: 76 U/L (ref 38–126)
Anion gap: 12 (ref 5–15)
BUN: 24 mg/dL — ABNORMAL HIGH (ref 8–23)
CO2: 19 mmol/L — ABNORMAL LOW (ref 22–32)
Calcium: 9.4 mg/dL (ref 8.9–10.3)
Chloride: 106 mmol/L (ref 98–111)
Creatinine, Ser: 1.28 mg/dL — ABNORMAL HIGH (ref 0.61–1.24)
GFR, Estimated: >60 mL/min (ref >60)
Glucose, Bld: 105 mg/dL — ABNORMAL HIGH (ref 70–99)
Potassium: 4.2 mmol/L (ref 3.5–5.1)
Sodium: 137 mmol/L (ref 135–145)
Total Bilirubin: 0.9 mg/dL (ref <1.2)
Total Protein: 7 g/dL (ref 6.5–8.1)

## 2023-08-15 LAB — BRAIN NATRIURETIC PEPTIDE: B Natriuretic Peptide: 61.5 pg/mL (ref 0.0–100.0)

## 2023-08-15 MED ORDER — HYDROCODONE-ACETAMINOPHEN 5-325 MG PO TABS
1.0000 | ORAL_TABLET | Freq: Once | ORAL | Status: AC
  Administered 2023-08-15: 1 via ORAL
  Filled 2023-08-15: qty 1

## 2023-08-15 MED ORDER — HYDROCODONE-ACETAMINOPHEN 5-325 MG PO TABS
1.0000 | ORAL_TABLET | Freq: Four times a day (QID) | ORAL | 0 refills | Status: DC | PRN
Start: 2023-08-15 — End: 2024-05-28

## 2023-08-15 NOTE — Discharge Instructions (Addendum)
You have had left foot pain for period of time.  Your previous x-ray of the left foot as well as ultrasound of your left foot did not show any concerning finding except for swelling.

## 2023-08-15 NOTE — ED Provider Notes (Signed)
[place] EMERGENCY DEPARTMENT AT [facility] Provider Note   CSN: [id number] Arrival date & time: 08/15/23  1354     History  Chief Complaint  Patient presents with   Foot Pain    [name] is a 67 y.o. male.  The history is provided by the patient and medical records. No language interpreter was used.  Foot Pain     67 year old male significant history of diabetes, hyperlipidemia, CAD, CHF, thyroid disease, has pacemaker status post heart transplant presenting with complaint of foot pain.  The endorsed atraumatic pain to his left foot ongoing for 1 month.  Pain is described as an uncomfortable painful sensation, constant, with associate swelling of his foot.  Pain seems to be improved when he takes Vicodin that was previous prescribed.  No fever or chills no chest pain or shortness of breath.  He mentioned he was seen and evaluated by [facility] several times and has had x-ray of his left foot as well as ultrasound DVT study but was not told the results.  He was given prednisone in the past without relief.  No history of gout.  No injury.  Recently had a UTI and is currently on amoxicillin.  Home Medications Prior to Admission medications   Medication Sig Start Date End Date Taking? Authorizing Provider  ACCU-CHEK GUIDE test strip USE 1 STRIP 4 TIMES DAILY 06/21/23   Shelva Majestic, MD  amLODipine (NORVASC) 10 MG tablet Take 10 mg by mouth in the morning.    [provider]  aspirin EC 81 MG tablet Take 1 tablet (81 mg total) by mouth daily. 01/25/17   Shelva Majestic, MD  atorvastatin (LIPITOR) 80 MG tablet Take 1 tablet (80 mg total) by mouth daily at 6 PM. 01/25/17   Shelva Majestic, MD  blood glucose meter kit and supplies KIT Dispense based on patient and insurance preference. Use up to four times daily as directed. 06/30/22   Shelva Majestic, MD  cholecalciferol (VITAMIN D) 1000 units tablet Take 3 tablets (3,000 Units total) by mouth  daily. 01/25/17   Shelva Majestic, MD  diltiazem [facility]) 180 MG 24 hr capsule Take 2 capsules (360 mg total) by mouth daily. Patient taking differently: Take 360 mg by mouth every evening. 01/25/17   Shelva Majestic, MD  ezetimibe (ZETIA) 10 MG tablet Take 1 tablet by mouth once daily 04/25/23   Shelva Majestic, MD  famotidine (PEPCID) 20 MG tablet Take 20 mg by mouth in the morning and at bedtime. 09/03/19 06/01/27  [provider]  levothyroxine (SYNTHROID, LEVOTHROID) 75 MCG tablet Take 1 tablet (75 mcg total) by mouth daily before breakfast. 01/25/17   Shelva Majestic, MD  magnesium oxide (MAG-OX) 400 (240 Mg) MG tablet Take 400 mg by mouth in the morning.    [provider]  magnesium oxide (MAG-OX) 400 MG tablet Take 1.5 tablets (600 mg total) by mouth 2 (two) times daily. Patient taking differently: Take 400 mg by mouth daily. 01/25/17   Shelva Majestic, MD  metFORMIN (GLUCOPHAGE) 1000 MG tablet TAKE 1 TABLET BY MOUTH TWICE DAILY WITH MEALS 07/27/23   Shelva Majestic, MD  metFORMIN (GLUCOPHAGE) 500 MG tablet Take 1 tablet (500 mg total) by mouth with breakfast, with lunch, and with evening meal. 07/21/23   Shelva Majestic, MD  mycophenolate (CELLCEPT) 500 MG tablet Take 1 tablet (500 mg total) by mouth 2 (two) times daily. 01/25/17   Tana Conch  O, MD  tacrolimus (PROGRAF) 0.5 MG capsule Take 2 capsules (1 mg total) by mouth 2 (two) times daily. Patient taking differently: Take 0.5-1 mg by mouth See admin instructions. Take 2 capsules (1 mg) by mouth in the morning & take 1 capsule (0.5 mg) by mouth in the evening. 01/25/17   Shelva Majestic, MD  valsartan (DIOVAN) 320 MG tablet Take 1 tablet (320 mg total) by mouth daily. 07/21/23   Shelva Majestic, MD      Allergies    Patient has no known allergies.    Review of Systems   Review of Systems  All other systems reviewed and are negative.   Physical Exam Updated Vital Signs BP 132/64 (BP Location:  Left Arm)   Pulse 99   Temp 97.8 F (36.6 C) (Oral)   Resp 18   SpO2 99%  Physical Exam Vitals and nursing note reviewed.  Constitutional:      General: He is not in acute distress.    Appearance: He is well-developed.  HENT:     Head: Atraumatic.  Eyes:     Conjunctiva/sclera: Conjunctivae normal.  Cardiovascular:     Rate and Rhythm: Normal rate and regular rhythm.     Pulses: Normal pulses.     Heart sounds: Normal heart sounds.  Pulmonary:     Effort: Pulmonary effort is normal.     Breath sounds: Normal breath sounds.  Abdominal:     Palpations: Abdomen is soft.  Musculoskeletal:     Cervical back: Neck supple.     Right lower leg: Edema present.     Left lower leg: Edema (2+ pitting edema to left lower extremity with some erythema noted to medial malleoli region and tender) present.  Skin:    Capillary Refill: Capillary refill takes 2 to 3 seconds.     Findings: No rash.  Neurological:     Mental Status: He is alert and oriented to person, place, and time.     ED Results / Procedures / Treatments   Labs (all labs ordered are listed, but only abnormal results are displayed) Labs Reviewed  COMPREHENSIVE METABOLIC PANEL - Abnormal; Notable for the following components:      Result Value   CO2 19 (*)    Glucose, Bld 105 (*)    BUN 24 (*)    Creatinine, Ser 1.28 (*)    AST 14 (*)    All other components within normal limits  CBC WITH DIFFERENTIAL/PLATELET - Abnormal; Notable for the following components:   RBC 4.07 (*)    Hemoglobin 11.7 (*)    HCT 37.1 (*)    All other components within normal limits  BRAIN NATRIURETIC PEPTIDE    EKG None  Radiology No results found.  Procedures Procedures    Medications Ordered in ED Medications - No data to display  ED Course/ Medical Decision Making/ A&P                                 Medical Decision Making Amount and/or Complexity of Data Reviewed Labs: ordered. Radiology: ordered.   BP 132/64 (BP  Location: Left Arm)   Pulse 99   Temp 97.8 F (36.6 C) (Oral)   Resp 18   SpO2 99%   77:44 PM  67 year old male significant history of diabetes, hyperlipidemia, CAD, CHF, thyroid disease, has pacemaker status post heart transplant presenting with complaint of foot pain.  The  endorsed atraumatic pain to his left foot ongoing for 1 month.  Pain is described as an uncomfortable painful sensation, constant, with associate swelling of his foot.  Pain seems to be improved when he takes Vicodin that was previous prescribed.  No fever or chills no chest pain or shortness of breath.  He mentioned he was seen and evaluated by [facility] several times and has had x-ray of his left foot as well as ultrasound DVT study but was not told the results.  He was given prednisone in the past without relief.  No history of gout.  No injury.  Recently had a UTI and is currently on amoxicillin.  Exam remarkable for peripheral edema involving the left lower extremities more greater than right with some mild erythema and warmth noted to medial malleoli region with tenderness to palpation.  Pedal pulse palpable.  EMR reviewed, the patient has been seen evaluate for this complaint previously and he has had x-ray of the left lower extremity which shows soft tissue swelling about the foot without any fracture or dislocation.  He also has vascular ultrasound of his left lower extremities without evidence of DVT.  He was also prescribed prednisone for potential gout but report no significant movement.  He does have history of CHF  -Labs ordered, independently viewed and interpreted by me.  Labs remarkable for Cr 1.28, an improvement from prior.  Normal BNP, normal WBC.  -The patient was maintained on a cardiac monitor.  I personally viewed and interpreted the cardiac monitored which showed an underlying rhythm of: NSR -Imaging including xray of L foot and Vascular US of LLE were considered but it has been done previously without  concerning finding. I was able to reviewed through EMR -This patient presents to the ED for concern of foot pain, this involves an extensive number of treatment options, and is a complaint that carries with it a high risk of complications and morbidity.  The differential diagnosis includes gout, cellulitis, DVT, fx, dislocation, strain, sprain, contusion, claudiccation -Co morbidities that complicate the patient evaluation includes CHF, DM, HTN -Treatment includes vicodin -Reevaluation of the patient after these medicines showed that the patient improved -PCP office notes or outside notes reviewed -Discussion with attending Dr. Lynelle Doctor who have evaluated pt and felt he can f/u outpt for further management of his L foot pain -Escalation to admission/observation considered: patients feels much better, is comfortable with discharge, and will follow up with PCP -Prescription medication considered, patient comfortable with vicodin -Social Determinant of Health considered   Doubt claudication as pedal pulses palpable.  Doubt infection as patient has had the symptoms for an ongoing period of time and he is not having any fever or white count.  He is also just finished a course of amoxicillin for UTI.  Will prescribe a short course of opiate pain medication and have patient follow-up closely with his specialist for further care.  Patient was understanding and agrees with plan.        Final Clinical Impression(s) / ED Diagnoses Final diagnoses:  Foot pain, left    Rx / DC Orders ED Discharge Orders          Ordered    HYDROcodone-acetaminophen (NORCO/VICODIN) 5-325 MG tablet  Every 6 hours PRN        08/15/23 2209              Fayrene Helper, PA-C 08/15/23 2210    Linwood Dibbles, MD 08/16/23 0009

## 2023-08-15 NOTE — ED Triage Notes (Signed)
Patient presents with a swollen, painful left foot since election day. He has to walk with a cane due to the pain.

## 2023-08-15 NOTE — ED Notes (Signed)
Pt came up for repeat vitals, pt had no worsening symptoms and VS were within normal range.

## 2023-08-16 NOTE — Unmapped (Signed)
Called pt to set-up appts for 10/2023.

## 2023-08-24 DIAGNOSIS — M79672 Pain in left foot: Secondary | ICD-10-CM | POA: Diagnosis not present

## 2023-08-25 NOTE — Unmapped (Signed)
[facility] Delivery Pharm[facility] Clinical Assessment & Refill Coordination Note    Andrew Dickerson, DOB: 27-Apr-1956  Phone: There are no phone numbers on file.    All above HIPAA information was verified with patient.     Was a Nurse, learning disability used for this call? No    Specialty Medication(s):   Transplant: mycophenolate mofetil 500mg  and tacrolimus 0.5mg      Current Outpatient Medications   Medication Sig Dispense Refill    amlodipine (NORVASC) 10 MG tablet Take 1 tablet (10 mg total) by mouth daily. 90 tablet 3    aspirin (ADULT LOW DOSE ASPIRIN) 81 MG tablet Take 1 tablet (81 mg total) by mouth daily. 90 tablet 3    atorvastatin (LIPITOR) 80 MG tablet Take 1 tablet (80 mg total) by mouth daily. 90 tablet 3    cholecalciferol, vitamin D3, 1,000 unit capsule Take 3 capsules (75 mcg total) by mouth daily.      dilTIAZem (CARDIZEM CD) 180 MG 24 hr capsule Take 2 capsules (360 mg total) by mouth daily. 60 capsule 11    ezetimibe (ZETIA) 10 mg tablet Take 1 tablet (10 mg total) by mouth daily.      famotidine (PEPCID) 20 MG tablet Take 1 tablet (20 mg total) by mouth Two (2) times a day. 180 tablet 3    levothyroxine (SYNTHROID) 75 MCG tablet Take 1 tablet (75 mcg total) by mouth daily. 30 tablet 11    magnesium oxide (MAG-OX) 400 mg (241.3 mg elemental magnesium) tablet Take 1 tablet (400 mg total) by mouth two (2) times a day.      mycophenolate (CELLCEPT) 500 mg tablet Take 1 tablet (500 mg total) by mouth Two (2) times a day. 180 tablet 3    tacrolimus (PROGRAF) 0.5 MG capsule Take 2 capsules (1 mg total) by mouth daily AND 1 capsule (0.5 mg total) nightly. 90 capsule 11    tamsulosin (FLOMAX) 0.4 mg capsule TAKE 1 CAPSULE EVERY DAY 90 capsule 3     No current facility-administered medications for this visit.        Changes to medications: Andrew Dickerson reports no changes at this time.    No Known Allergies    Changes to allergies: No    SPECIALTY MEDICATION ADHERENCE     Mycophenolate 500  mg : 70 days of medicine on hand   Tacrolimus 0.5 mg: 15 days of medicine on hand     Medication Adherence    Patient reported X missed doses in the last month: 1  Specialty Medication: Tacrolimus 0.5mg   Patient is on additional specialty medications: Yes  Additional Specialty Medications: Mycophenolate 500mg   Patient Reported Additional Medication X Missed Doses in the Last Month: 0  Patient is on more than two specialty medications: No          Specialty medication(s) dose(s) confirmed: Regimen is correct and unchanged.     Are there any concerns with adherence? No    Adherence counseling provided? Not needed    CLINICAL MANAGEMENT AND INTERVENTION      Clinical Benefit Assessment:    Do you feel the medicine is effective or helping your condition? Yes    Clinical Benefit counseling provided? Not needed    Adverse Effects Assessment:    Are you experiencing any side effects? No    Are you experiencing difficulty administering your medicine? No    Quality of Life Assessment:    Quality of Life    Rheumatology  Oncology  Dermatology  Cystic Fibrosis          How many days over the past month did your heart transplant  keep you from your normal activities? For example, brushing your teeth or getting up in the morning. 0    Have you discussed this with your provider? Not needed    Acute Infection Status:    Acute infections noted within Epic:  No active infections  Patient reported infection: None    Therapy Appropriateness:    Is therapy appropriate based on current medication list, adverse reactions, adherence, clinical benefit and progress toward achieving therapeutic goals? Yes, therapy is appropriate and should be continued     DISEASE/MEDICATION-SPECIFIC INFORMATION      N/A    Solid Organ Transplant: Not Applicable    PATIENT SPECIFIC NEEDS     Does the patient have any physical, cognitive, or cultural barriers? No    Is the patient high risk? Yes, patient is taking a REMS drug. Medication is dispensed in compliance with REMS program    Did the patient require a clinical intervention? No    Does the patient require physician intervention or other additional services (i.e., nutrition, smoking cessation, social work)? No    SOCIAL DETERMINANTS OF HEALTH     At the [facility], we have learned that life circumstances - like trouble affording food, housing, utilities, or transportation can affect the health of many of our patients.   That is why we wanted to ask: are you currently experiencing any life circumstances that are negatively impacting your health and/or quality of life? Patient declined to answer    Social Drivers of Health     Food Insecurity: Food Insecurity Present (04/19/2023)    Hunger Vital Sign     Worried About Running Out of Food in the Last Year: Sometimes true     Ran Out of Food in the Last Year: Sometimes true   Internet Connectivity: Possible Internet connectivity concern identified (04/19/2023)    Internet Connectivity     Do you have access to internet services: Yes     How do you connect to the internet: Not on file     Is your internet connection strong enough for you to watch video on your device without major problems?: Not on file     Do you have enough data to get through the month?: Not on file     Does at least one of the devices have a camera that you can use for video chat?: Not on file   Housing/Utilities: Low Risk  (04/19/2023)    Housing/Utilities     Within the past 12 months, have you ever stayed: outside, in a car, in a tent, in an overnight shelter, or temporarily in someone else's home (i.e. couch-surfing)?: No     Are you worried about losing your housing?: No     Within the past 12 months, have you been unable to get utilities (heat, electricity) when it was really needed?: No   Tobacco Use: Medium Risk (05/20/2023)    Received from [facility]    Patient History     Smoking Tobacco Use: Former     Smokeless Tobacco Use: Never     Passive Exposure: Not on file   Transportation Needs: No Transportation Needs (04/19/2023)    PRAPARE - Therapist, art (Medical): No     Lack of Transportation (Non-Medical): No   Alcohol Use: Not on file  Interpersonal Safety: Not on file   Physical Activity: Inactive (07/13/2022)    Received from [facility], [facility]    Exercise Vital Sign     Days of Exercise per Week: 0 days     Minutes of Exercise per Session: 0 min   Intimate Partner Violence: Not At Risk (07/13/2022)    Received from [facility], [facility]    Humiliation, Afraid, Rape, and Kick questionnaire     Fear of Current or Ex-Partner: No     Emotionally Abused: No     Physically Abused: No     Sexually Abused: No   Stress: No Stress Concern Present (06/05/2021)    Received from [facility], [facility]    [facility] - Occupational Stress Questionnaire     Feeling of Stress : Not at all   Substance Use: Not on file (07/17/2023)   Social Connections: Moderately Isolated (07/13/2022)    Received from [facility], [facility]    Social Connection and Isolation Panel [NHANES]     Frequency of Communication with Friends and Family: Twice a week     Frequency of Social Gatherings with Friends and Family: Twice a week     Attends Religious Services: More than 4 times per year     Active Member of [employer] or Organizations: No     Attends Banker Meetings: Never     Marital Status: Divorced   Programmer, applications: Low Risk  (04/19/2023)    Overall Financial Resource Strain (CARDIA)     Difficulty of Paying Living Expenses: Not very hard   Depression: Not at risk (07/01/2022)    PHQ-2     PHQ-2 Score: 0   Health Literacy: Not on file       Would you be willing to receive help with any of the needs that you have identified today? Not applicable       SHIPPING     Specialty Medication(s) to be Shipped:   Transplant: none- patient declined mycophenolate and tacrolimus today    Other medication(s) to be shipped: No additional medications requested for fill at this time     Changes to insurance: No    Delivery Scheduled: Patient declined refill at this time due to has at least 2 weeks on hand..     Medication will be delivered via UPS to the confirmed prescription address in [facility].    The patient will receive a drug information handout for each medication shipped and additional FDA Medication Guides as required.  Verified that patient has previously received a Conservation officer, historic buildings and a Surveyor, mining.    The patient or caregiver noted above participated in the development of this care plan and knows that they can request review of or adjustments to the care plan at any time.      All of the patient's questions and concerns have been addressed.    [name], [facility]   [facility] Specialty Pharmacist

## 2023-09-14 NOTE — Unmapped (Signed)
Spoke with patient regarding having his lab work done. Sent him a lab slip via MyChart, and had one mailed.   Patient reports he resumed metformin 1000 mg TID and Flomax 0.4 mg daily. I added these to his medication list.   Patient reports bilateral foot pain for which he was evaluated locally. No further suggestions

## 2023-09-20 NOTE — Unmapped (Signed)
[facility] Special[facility] and Home Delivery Pharmacy Refill Coordination Note    Specialty Medication(s) to be Shipped:   Transplant: tacrolimus 0.5 mg    Other medication(s) to be shipped:  diltiazem and levothyroxine      Andrew Dickerson, DOB: [birth year]  Phone: There are no phone numbers on file.      All above HIPAA information was verified with patient.     Was a Nurse, learning disability used for this call? No    Completed refill call assessment today to schedule patient's medication shipment from the [facility] Delivery Pharmacy  [phone]).  All relevant notes have been reviewed.     Specialty medication(s) and dose(s) confirmed: Regimen is correct and unchanged.   Changes to medications: Giulio reports no changes at this time.  Changes to insurance: No  New side effects reported not previously addressed with a pharmacist or physician: None reported  Questions for the pharmacist: No    Confirmed patient received a Conservation officer, historic buildings and a Surveyor, mining with first shipment. The patient will receive a drug information handout for each medication shipped and additional FDA Medication Guides as required.       DISEASE/MEDICATION-SPECIFIC INFORMATION        N/A    SPECIALTY MEDICATION ADHERENCE     Medication Adherence    Patient reported X missed doses in the last month: 0  Specialty Medication: tacrolimus 0.5 MG capsule (PROGRAF)  Patient is on additional specialty medications: No              Were doses missed due to medication being on hold? No    tacrolimus 0.5 mg: 3 days of medicine on hand       REFERRAL TO PHARMACIST     Referral to the pharmacist: Not needed      [facility]     Shipping address confirmed in Epic.       Delivery Scheduled: Yes, Expected medication delivery date: 09/22/2023.     Medication will be delivered via UPS to the prescription address in Epic [facility].    Quintella Reichert   [facility] Delivery Pharm[facility]  Specialty Technician

## 2023-09-21 MED FILL — DILTIAZEM CD 180 MG CAPSULE,EXTENDED RELEASE 24 HR: ORAL | 30 days supply | Qty: 60 | Fill #1

## 2023-09-21 MED FILL — LEVOTHYROXINE 75 MCG TABLET: ORAL | 30 days supply | Qty: 30 | Fill #1

## 2023-09-21 MED FILL — TACROLIMUS 0.5 MG CAPSULE, IMMEDIATE-RELEASE: ORAL | 30 days supply | Qty: 90 | Fill #4

## 2023-09-27 DIAGNOSIS — E559 Vitamin D deficiency, unspecified: Secondary | ICD-10-CM | POA: Diagnosis not present

## 2023-09-27 DIAGNOSIS — R7989 Other specified abnormal findings of blood chemistry: Secondary | ICD-10-CM | POA: Diagnosis not present

## 2023-09-27 DIAGNOSIS — E785 Hyperlipidemia, unspecified: Secondary | ICD-10-CM | POA: Diagnosis not present

## 2023-09-27 DIAGNOSIS — Z79899 Other long term (current) drug therapy: Secondary | ICD-10-CM | POA: Diagnosis not present

## 2023-09-27 DIAGNOSIS — Z48298 Encounter for aftercare following other organ transplant: Secondary | ICD-10-CM | POA: Diagnosis not present

## 2023-09-27 DIAGNOSIS — Z941 Heart transplant status: Secondary | ICD-10-CM | POA: Diagnosis not present

## 2023-09-27 DIAGNOSIS — E119 Type 2 diabetes mellitus without complications: Secondary | ICD-10-CM | POA: Diagnosis not present

## 2023-09-27 LAB — HEMOGLOBIN A1C: Hemoglobin A1C: 6.5

## 2023-09-29 ENCOUNTER — Other Ambulatory Visit: Payer: Self-pay | Admitting: Family Medicine

## 2023-10-03 LAB — COMPREHENSIVE METABOLIC PANEL
ALBUMIN/GLOBULIN RATIO: 1.9 (calc) (ref 1.0–2.5)
ALBUMIN: 4.4 g/dL (ref 3.6–5.1)
ALKALINE PHOSPHATASE: 86 U/L (ref 35–144)
ALT (SGPT): 8 U/L — ABNORMAL LOW (ref 9–46)
AST (SGOT): 7 U/L — ABNORMAL LOW (ref 10–35)
BILIRUBIN TOTAL: 0.6 mg/dL (ref 0.2–1.2)
BLOOD UREA NITROGEN: 27 mg/dL — ABNORMAL HIGH (ref 7–25)
BUN / CREAT RATIO: 24 (calc) — ABNORMAL HIGH (ref 6–22)
CALCIUM: 9.3 mg/dL (ref 8.6–10.3)
CHLORIDE: 108 mmol/L (ref 98–110)
CO2: 19 mmol/L — ABNORMAL LOW (ref 20–32)
CREATININE: 1.11 mg/dL (ref 0.70–1.35)
EGFR CKD-EPI NON-AA MALE: 73 mL/min/{1.73_m2}
GLOBULIN, TOTAL: 2.3 g/dL (ref 1.9–3.7)
GLUCOSE RANDOM: 111 mg/dL — ABNORMAL HIGH (ref 65–99)
POTASSIUM: 4.7 mmol/L (ref 3.5–5.3)
PROTEIN TOTAL: 6.7 g/dL (ref 6.1–8.1)
SODIUM: 135 mmol/L (ref 135–146)

## 2023-10-03 LAB — VITAMIN D 25 HYDROXY: VITAMIN D, TOTAL (25OH): 66 ng/mL (ref 30–100)

## 2023-10-03 LAB — CBC W/ DIFFERENTIAL
BASOPHILS ABSOLUTE COUNT: 40 {cells}/uL (ref 0–200)
BASOPHILS RELATIVE PERCENT: 0.5 %
EOSINOPHILS ABSOLUTE COUNT: 79 {cells}/uL (ref 15–500)
EOSINOPHILS RELATIVE PERCENT: 1 %
HEMATOCRIT: 34.7 % — ABNORMAL LOW (ref 38.5–50.0)
HEMOGLOBIN: 11.4 g/dL — ABNORMAL LOW (ref 13.2–17.1)
LYMPHOCYTES ABSOLUTE COUNT: 1138 {cells}/uL (ref 850–3900)
LYMPHOCYTES RELATIVE PERCENT: 14.4 %
MEAN CORPUSCULAR HEMOGLOBIN CONC: 32.9 g/dL (ref 32.0–36.0)
MEAN CORPUSCULAR HEMOGLOBIN: 28.6 pg (ref 27.0–33.0)
MEAN CORPUSCULAR VOLUME: 87.2 fL (ref 80.0–100.0)
MEAN PLATELET VOLUME: 10.4 fL (ref 7.5–12.5)
MONOCYTES ABSOLUTE COUNT: 395 {cells}/uL (ref 200–950)
MONOCYTES RELATIVE PERCENT: 5 %
NEUTROPHILS ABSOLUTE COUNT: 6249 {cells}/uL (ref 1500–7800)
NEUTROPHILS RELATIVE PERCENT: 79.1 %
PLATELET COUNT: 296 10*3/uL (ref 140–400)
RED BLOOD CELL COUNT: 3.98 10*6/uL — ABNORMAL LOW (ref 4.20–5.80)
RED CELL DISTRIBUTION WIDTH: 13.1 % (ref 11.0–15.0)
WHITE BLOOD CELL COUNT: 7.9 10*3/uL (ref 3.8–10.8)

## 2023-10-03 LAB — LIPID PANEL
CHOLESTEROL/HDL RATIO SCREEN: 4 (calc)
CHOLESTEROL: 96 mg/dL
HDL CHOLESTEROL: 24 mg/dL — ABNORMAL LOW
LDL CHOLESTEROL CALCULATED: 56 mg/dL
NON-HDL CHOLESTEROL: 72 (calc)
TRIGLYCERIDES: 80 mg/dL

## 2023-10-03 LAB — PHOSPHORUS: PHOSPHORUS: 2.6 mg/dL (ref 2.1–4.3)

## 2023-10-03 LAB — MAGNESIUM: MAGNESIUM: 1.6 mg/dL (ref 1.5–2.5)

## 2023-10-03 LAB — HEMOGLOBIN A1C: HEMOGLOBIN A1C: 6.5 % — ABNORMAL HIGH

## 2023-10-03 LAB — TSH: THYROID STIMULATING HORMONE: 1.88 m[IU]/L (ref 0.40–4.50)

## 2023-10-03 LAB — TACROLIMUS LEVEL, TROUGH: TACROLIMUS, TROUGH: 6.9 ug/L (ref 5.0–20.0)

## 2023-10-03 NOTE — Unmapped (Signed)
Discussed recent labs with Chucky May, PharmD.  Plan is to Make No Changes  with repeat labs in 1 Month with annual cardiac testing.    Secure MyChart message sent to the patient to relay this information.    Lab Results   Component Value Date    TACROLIMUS 6.9 09/27/2023    SIROLIMUS <1.0 (L) 06/10/2023     Goal: Tac: 5-8  Current Dose: Tacrolimus 1 mg/0.5 mg     Lab Results   Component Value Date    BUN 27 (H) 09/27/2023    CREATININE 1.11 09/27/2023    K 4.7 09/27/2023    GLU 111 (H) 09/27/2023    MG 1.6 09/27/2023     Lab Results   Component Value Date    WBC 7.9 09/27/2023    HGB 11.4 (L) 09/27/2023    HCT 34.7 (L) 09/27/2023    PLT 296 09/27/2023    NEUTROABS 6,249 09/27/2023    EOSABS 79 09/27/2023

## 2023-10-23 ENCOUNTER — Other Ambulatory Visit: Payer: Self-pay | Admitting: Family Medicine

## 2023-10-23 DIAGNOSIS — R7309 Other abnormal glucose: Secondary | ICD-10-CM

## 2023-10-24 MED ORDER — VALSARTAN 160 MG TABLET
ORAL_TABLET | Freq: Every day | ORAL | 3 refills | 90.00 days | Status: CN
Start: 2023-10-24 — End: 2024-10-23

## 2023-10-25 ENCOUNTER — Ambulatory Visit: Admit: 2023-10-25 | Discharge: 2023-10-25 | Payer: MEDICARE

## 2023-10-25 ENCOUNTER — Inpatient Hospital Stay: Admit: 2023-10-25 | Discharge: 2023-10-25 | Payer: MEDICARE

## 2023-10-25 DIAGNOSIS — Z941 Heart transplant status: Principal | ICD-10-CM

## 2023-10-25 DIAGNOSIS — Z79899 Other long term (current) drug therapy: Principal | ICD-10-CM

## 2023-10-25 DIAGNOSIS — R918 Other nonspecific abnormal finding of lung field: Secondary | ICD-10-CM | POA: Diagnosis not present

## 2023-10-25 DIAGNOSIS — Z4821 Encounter for aftercare following heart transplant: Secondary | ICD-10-CM | POA: Diagnosis not present

## 2023-10-25 DIAGNOSIS — E119 Type 2 diabetes mellitus without complications: Secondary | ICD-10-CM | POA: Diagnosis not present

## 2023-10-25 DIAGNOSIS — E039 Hypothyroidism, unspecified: Secondary | ICD-10-CM | POA: Diagnosis not present

## 2023-10-25 DIAGNOSIS — J432 Centrilobular emphysema: Secondary | ICD-10-CM | POA: Diagnosis not present

## 2023-10-25 DIAGNOSIS — J439 Emphysema, unspecified: Secondary | ICD-10-CM | POA: Diagnosis not present

## 2023-10-25 DIAGNOSIS — R9431 Abnormal electrocardiogram [ECG] [EKG]: Secondary | ICD-10-CM | POA: Diagnosis not present

## 2023-10-25 DIAGNOSIS — I10 Essential (primary) hypertension: Secondary | ICD-10-CM | POA: Diagnosis not present

## 2023-10-25 DIAGNOSIS — E785 Hyperlipidemia, unspecified: Secondary | ICD-10-CM | POA: Diagnosis not present

## 2023-10-25 MED ORDER — VALSARTAN 160 MG TABLET
ORAL_TABLET | Freq: Every day | ORAL | 3 refills | 90.00 days | Status: CP
Start: 2023-10-25 — End: 2024-10-24

## 2023-10-25 MED FILL — TACROLIMUS 0.5 MG CAPSULE, IMMEDIATE-RELEASE: ORAL | 30 days supply | Qty: 90 | Fill #0

## 2023-10-25 MED FILL — LEVOTHYROXINE 75 MCG TABLET: ORAL | 30 days supply | Qty: 30 | Fill #0

## 2023-10-25 MED FILL — DILTIAZEM CD 180 MG CAPSULE,EXTENDED RELEASE 24 HR: ORAL | 30 days supply | Qty: 60 | Fill #0

## 2023-10-25 NOTE — Unmapped (Signed)
 [facility]IT[facility] TRANSPLANT [facility]INIC PHARMACY NOTE  10/25/2023   Andrew Dickerson  [id number]    Medication changes today:   1. None    Education/Adherence tools provided today:  1.provided updated medication list    Follow up items:  1. Health maintenane    Next visit with pharmacy in PRN  ____________________________________________________________________    Andrew Dickerson is a 68 y.o. male s/p heart transplant on 07/18/2012 (Heart) 2/2  ischemic cardiomyopathy . Patient was maintained on LVAD support prior to transplantation.    Other PMH significant for hypertension, HLD, Diabetes, BPH    Seen by pharmacy today for:  med management in conjunction with annual testing    CC:  Patient has no complaints today        Vitals:    10/25/23 1019   BP: 137/77   Pulse: 94   Temp: 36.1 ??C (97 ??F)   SpO2: 100%       No Known Allergies    All medications reviewed and updated. Medication list includes revisions made during today???s encounter    Outpatient Encounter Medications as of 10/25/2023   Medication Sig Dispense Refill    amlodipine (NORVASC) 10 MG tablet Take 1 tablet (10 mg total) by mouth daily. 90 tablet 3    aspirin (ADULT LOW DOSE ASPIRIN) 81 MG tablet Take 1 tablet (81 mg total) by mouth daily. 90 tablet 3    atorvastatin (LIPITOR) 80 MG tablet Take 1 tablet (80 mg total) by mouth daily. 90 tablet 3    cholecalciferol, vitamin D3, 1,000 unit capsule Take 3 capsules (75 mcg total) by mouth daily.      dilTIAZem (CARDIZEM CD) 180 MG 24 hr capsule Take 2 capsules (360 mg total) by mouth daily. 60 capsule 11    ezetimibe (ZETIA) 10 mg tablet Take 1 tablet (10 mg total) by mouth daily.      famotidine (PEPCID) 20 MG tablet Take 1 tablet (20 mg total) by mouth Two (2) times a day. 180 tablet 3    levothyroxine (SYNTHROID) 75 MCG tablet Take 1 tablet (75 mcg total) by mouth daily. 30 tablet 11    magnesium oxide (MAG-OX) 400 mg (241.3 mg elemental magnesium) tablet Take 1 tablet (400 mg total) by mouth daily. metFORMIN (GLUCOPHAGE) 1000 MG tablet Take 1 tablet (1,000 mg total) by mouth three (3) times a day.      mycophenolate (CELLCEPT) 500 mg tablet Take 1 tablet (500 mg total) by mouth Two (2) times a day. 180 tablet 3    tacrolimus (PROGRAF) 0.5 MG capsule Take 2 capsules (1 mg total) by mouth daily AND 1 capsule (0.5 mg total) nightly. 90 capsule 11    tamsulosin (FLOMAX) 0.4 mg capsule TAKE 1 CAPSULE EVERY DAY 90 capsule 3    valsartan (DIOVAN) 320 MG tablet Take 1 tablet (320 mg total) by mouth daily.      [DISCONTINUED] tamsulosin (FLOMAX) 0.4 mg capsule Take 1 capsule (0.4 mg total) by mouth daily.       No facility-administered encounter medications on file as of 10/25/2023.       Induction agent : None    CURRENT IMMUNOSUPPRESSION:   tacrolimus 1 mg qAM, 0.5 mg qPM PO prograf goal:  5-8   mycophenolate 500 mg PO BID   steroid free     Patient  has mild and tolerable tremor that is stable    IMMUNOSUPPRESSION DRUG LEVELS:  Lab Results   Component Value Date    Tacrolimus,  Trough 6.9 09/27/2023    Tacrolimus, Trough 5.6 06/10/2023    Tacrolimus, Trough 6.0 05/24/2023    Tacrolimus, Trough 7.6 11/29/2014    Tacrolimus, Trough 9.2 07/24/2014    Tacrolimus, Trough 9.0 06/05/2014     No Prograf level today    Graft function: stable  DSA:  has a history of weakly positive 11/18; DSA panels WNL ever since with most recent levels on  09/15/22  Biopsies to date: First two post transplant biopsies in 2013/2014 were positive for mild ACR, but no rejections since  WBC/ANC:  wnl    Plan: Will maintain current immunosuppression pending cardiac testing    CAV Prophylaxis:   Aspirin: asa 81 mg daily  Statin: atorvastatin; currently 80 mg  Misc: diltiazem currently on 360 mg daily  Plan: Continue    BP: Goal < 140/90. Encounter vitals reported above  Home BP ranges: 130s-150s/60s with majority of values <140/90 mmHg  Current meds include: amlodipine 10 mg qAM, diltiazem 360 mg qPM, valsartan 320 mg daily  Plan: within goal; continue medications as above    Anemia:  Lab Results   Component Value Date    HGB 11.4 (L) 09/27/2023     Lab Results   Component Value Date    HCT 34.7 (L) 09/27/2023     Iron panel:  Lab Results   Component Value Date    IRON 90 06/09/2012    TIBC 336 06/09/2012    FERRITIN 511 (H) 06/09/2012     Lab Results   Component Value Date    Iron Saturation (%) 27 06/09/2012     Plan: hematocrit/hemoglobin within goal. Continue to monitor.      DM:   Lab Results   Component Value Date    A1C 6.5 (H) 09/27/2023   Goal A1c < 7  Currently on: metformin 1000 mg PO TID  Home BS log: not checking  Exercise: walks occasionally  Hypoglycemia: no  Plan:   His PCP is managing his diabetes, within goal will continue to monitor    Electrolytes: wnl  Meds currently on: magnesium 400 mg daily  Plan: Continue medication as above    GI:     Meds currently on: famotidine 20 mg PO BID  Plan: Continue medication as above.    Pain:  denies pain at this time  Meds currently on: NTD  Plan: Continue to monitor    Bone health:   Patient currently off steroid therapy  Vitamin D Level: last level is 66 on 09/27/23; Goal > 30   Last DEXA results: N/A  Current meds include: cholecalciferol 3000 units daily  Plan: Within goal. Continue to monitor.     Men's Health:  Andrew Dickerson is a 68 y.o. male. Patient reports no men's/women's health issues. Denies urinary symptoms at this time.  Meds currently on: tamsulosin 0.4 mg daily  Plan: Continue medication as above.     Hypothyroidism  Last TSH 1.88 09/27/23  Meds currently on: Levothyroxine 75 mcg daily  Plan: Within goal, continue to monitor      Adherence: Patient has excellent understanding of medications.  Patient  does fill their own pill box on a regular basis at home.  Patient brought medication card: Utilizes an app on his phone to track and remind him to take medications  Pill box:did not bring  Patient requested refills for the following meds: None  Corrections needed in Epic medication list: Added valsartan  Provided moderate adherence counseling/intervention  Spent approximately 20 minutes on educating this patient and greater than 50% was spent in direct face to face counseling regarding post transplant medication education. Questions and concerns were address to patient's satisfaction.    During this visit, the following was completed:   BG log data assessment  BP log data assessment  Labs ordered and evaluated  complex treatment plan >1 DS   Patient education was completed for 11-24 minutes     All questions/concerns were addressed to the patient's satisfaction.  __________________________________________  PATIENT SEEN AND EVALUATED BY:    Chucky May, PharmD, BCTXP  Cardiothoracic Transplant Pharmacist

## 2023-10-25 NOTE — Unmapped (Signed)
 Called pt to make appointment for March.

## 2023-10-26 DIAGNOSIS — Z941 Heart transplant status: Secondary | ICD-10-CM | POA: Diagnosis not present

## 2023-10-27 NOTE — Unmapped (Signed)
 [facility] Refill Coordination Note    Picked up tac 2 days ago from [facility]    Specialty Medication(s) to be Shipped:   Transplant: mycophenolate mofetil 500mg     Other medication(s) to be shipped:  amlodipine, aspirin, famotidine, atorvastatin     Andrew Dickerson, DOB: [birth year]  Phone: There are no phone numbers on file.      All above HIPAA information was verified with patient.     Was a Nurse, learning disability used for this call? No    Completed refill call assessment today to schedule patient's medication shipment from the [facility]  [phone]).  All relevant notes have been reviewed.     Specialty medication(s) and dose(s) confirmed: Regimen is correct and unchanged.   Changes to medications: Laiken reports no changes at this time.  Changes to insurance: No  New side effects reported not previously addressed with a pharmacist or physician: None reported  Questions for the pharmacist: No    Confirmed patient received a Conservation officer, historic buildings and a Surveyor, mining with first shipment. The patient will receive a drug information handout for each medication shipped and additional FDA Medication Guides as required.       DISEASE/MEDICATION-SPECIFIC INFORMATION        N/A    SPECIALTY MEDICATION ADHERENCE     Medication Adherence    Patient reported X missed doses in the last month: 0  Specialty Medication: tacrolimus 0.5mg  (1mg  AM, 0.5mg  PM)  Patient is on additional specialty medications: Yes  Additional Specialty Medications: Mycophenolate 500mg  BID  Patient Reported Additional Medication X Missed Doses in the Last Month: 0  Patient is on more than two specialty medications: No  Informant: patient              Were doses missed due to medication being on hold? No    Tacrolimus  0.5 mg: 28 days of medicine on hand   mycophenolate 500 mg: 14 days of medicine on hand       REFERRAL TO PHARMACIST     Referral to the pharmacist: Not needed      SHIPPING Shipping address confirmed in Epic.       Delivery Scheduled: Yes, Expected medication delivery date: 11/04/23.     Medication will be delivered via UPS to the prescription address in Epic WAM.    Darryl Nestle, PharmD   [facility]  Specialty Pharmacist

## 2023-11-01 LAB — FSAB CLASS 2 ANTIBODY SPECIFICITY
CLASS 2 ANTIBODIES IDENTIFIED: 1:1 {titer}
HLA CL2 AB RESULT: POSITIVE

## 2023-11-01 LAB — HLA DS POST TRANSPLANT
ANTI-DONOR DRW #1 MFI: 130 MFI
ANTI-DONOR DRW #2 MFI: 16 MFI
ANTI-DONOR HLA-A #1 MFI: 69 MFI
ANTI-DONOR HLA-A #2 MFI: 11 MFI
ANTI-DONOR HLA-B #1 MFI: 6 MFI
ANTI-DONOR HLA-B #2 MFI: 110 MFI
ANTI-DONOR HLA-C #1 MFI: 0 MFI
ANTI-DONOR HLA-C #2 MFI: 0 MFI
ANTI-DONOR HLA-DQB #1 MFI: 37 MFI
ANTI-DONOR HLA-DQB #2 MFI: 8 MFI
ANTI-DONOR HLA-DR #1 MFI: 0 MFI
ANTI-DONOR HLA-DR #2 MFI: 0 MFI

## 2023-11-01 LAB — FSAB CLASS 1 ANTIBODY SPECIFICITY: HLA CLASS 1 ANTIBODY RESULT: NEGATIVE

## 2023-11-02 DIAGNOSIS — R3914 Feeling of incomplete bladder emptying: Secondary | ICD-10-CM | POA: Diagnosis not present

## 2023-11-02 DIAGNOSIS — R351 Nocturia: Secondary | ICD-10-CM | POA: Diagnosis not present

## 2023-11-02 DIAGNOSIS — N401 Enlarged prostate with lower urinary tract symptoms: Secondary | ICD-10-CM | POA: Diagnosis not present

## 2023-11-02 NOTE — Unmapped (Signed)
 Reviewed DSAs with Chucky May, repeat in 6-12 M

## 2023-11-02 NOTE — Unmapped (Addendum)
 Reviewed Chest CT from 10/25/23 with Dr. Cherly Hensen, see exchange below. Will continue with annual low dose CT screens      ----- Message from Joya Gaskins, MD sent at 10/27/2023  7:31 PM EST -----  Thanks for update; agree  ----- Message -----  From: Duaine Dredge, RN  Sent: 10/27/2023   1:19 PM EST  To: Liliane Shi, MD    Hi Patty,     Patient has a smoking history ( 20 pack year). So I think it would be reasonable to continue with low dose CT scans annually. I have included the rationale from your note, as well as the impression of the chest CT he has this week.   Unfortunately, they cancelled his DSE and I was not notified so was not able to change the order to a Nuc Stress. He will be back in March for that .    Lung Nodule and Adrenal nodule  Seen on abdominal CT 06/2022: 4 mm right middle lobe pulmonary nodule. Consider follow-up CT in 12 months.  Also 06/2022 CT 1.1 cm right adrenal nodule with imaging characteristics consistent with benign adrenal adenoma as a follow-up to the 05/2022 which first discovered it.   - Plan for chest CT noncontrast in 10/2023 to compare to 06/2022, when he returns for DSE test.    IMPRESSION:  Emphysema with scattered pulmonary micronodules. Some of these nodules are previously visualized on the CT abdomen/pelvis and are unchanged. Consider follow-up CT chest in 12 months. Alternatively consider enrolling in lung cancer screening program if the patient meets eligibility criteria.

## 2023-11-03 MED FILL — AMLODIPINE 10 MG TABLET: ORAL | 90 days supply | Qty: 90 | Fill #1

## 2023-11-03 MED FILL — ATORVASTATIN 80 MG TABLET: ORAL | 90 days supply | Qty: 90 | Fill #3

## 2023-11-03 MED FILL — MYCOPHENOLATE MOFETIL 500 MG TABLET: ORAL | 90 days supply | Qty: 180 | Fill #1

## 2023-11-03 MED FILL — FAMOTIDINE 20 MG TABLET: ORAL | 90 days supply | Qty: 180 | Fill #1

## 2023-11-03 MED FILL — ASPIRIN 81 MG TABLET,DELAYED RELEASE: ORAL | 90 days supply | Qty: 90 | Fill #1

## 2023-11-08 ENCOUNTER — Inpatient Hospital Stay: Admit: 2023-11-08 | Discharge: 2023-11-10 | Payer: MEDICARE

## 2023-11-08 DIAGNOSIS — Z4821 Encounter for aftercare following heart transplant: Secondary | ICD-10-CM | POA: Diagnosis not present

## 2023-11-08 DIAGNOSIS — I129 Hypertensive chronic kidney disease with stage 1 through stage 4 chronic kidney disease, or unspecified chronic kidney disease: Secondary | ICD-10-CM | POA: Diagnosis not present

## 2023-11-08 DIAGNOSIS — Z941 Heart transplant status: Secondary | ICD-10-CM | POA: Diagnosis not present

## 2023-11-08 DIAGNOSIS — E1122 Type 2 diabetes mellitus with diabetic chronic kidney disease: Secondary | ICD-10-CM | POA: Diagnosis not present

## 2023-11-08 DIAGNOSIS — N189 Chronic kidney disease, unspecified: Secondary | ICD-10-CM | POA: Diagnosis not present

## 2023-11-08 DIAGNOSIS — J439 Emphysema, unspecified: Secondary | ICD-10-CM | POA: Diagnosis not present

## 2023-11-08 DIAGNOSIS — E669 Obesity, unspecified: Secondary | ICD-10-CM | POA: Diagnosis not present

## 2023-11-08 DIAGNOSIS — E785 Hyperlipidemia, unspecified: Secondary | ICD-10-CM | POA: Diagnosis not present

## 2023-11-08 DIAGNOSIS — Z79899 Other long term (current) drug therapy: Secondary | ICD-10-CM | POA: Diagnosis not present

## 2023-11-08 MED ADMIN — regadenoson (LEXISCAN) injection: INTRAVENOUS | @ 16:00:00 | Stop: 2023-11-08

## 2023-11-08 MED ADMIN — Tc-99m Sestamibi (Cardiolite): 16 | INTRAVENOUS | @ 16:00:00 | Stop: 2023-11-08

## 2023-11-08 MED ADMIN — regadenoson (LEXISCAN) injection: .4 mg | INTRAVENOUS | @ 16:00:00 | Stop: 2023-11-08

## 2023-11-08 MED ADMIN — Tc-99m Sestamibi (Cardiolite): 47.5 | INTRAVENOUS | @ 18:00:00 | Stop: 2023-11-08

## 2023-11-10 NOTE — Unmapped (Signed)
 Reviewed with Dr. Cherly Hensen    Nuclear Stress        CXR: The lungs remain clear with old rib fractures on the left poststernotomy. Stable cardiomediastinal contour.     DSA: No DSA      Recent Labs:   Appointment on 10/25/2023   Component Date Value Ref Range Status    EKG Ventricular Rate 10/25/2023 100  BPM Final    EKG Atrial Rate 10/25/2023 100  BPM Final    EKG P-R Interval 10/25/2023 120  ms Final    EKG QRS Duration 10/25/2023 78  ms Final    EKG Q-T Interval 10/25/2023 332  ms Final    EKG QTC Calculation 10/25/2023 428  ms Final    EKG Calculated P Axis 10/25/2023 19  degrees Final    EKG Calculated R Axis 10/25/2023 6  degrees Final    EKG Calculated T Axis 10/25/2023 26  degrees Final    QTC Fredericia 10/25/2023 393  ms Final    Donor ID 10/25/2023 [id number]   Final    Donor HLA-A Antigen #1 10/25/2023 A30   Final    Anti-Donor HLA-A #1 MFI 10/25/2023 69  <1000 MFI Final    Donor HLA-A Antigen #2 10/25/2023 A74   Final    Anti-Donor HLA-A #2 MFI 10/25/2023 11  <1000 MFI Final    Donor HLA-B Antigen #1 10/25/2023 B42   Final    Anti-Donor HLA-B #1 MFI 10/25/2023 6  <1000 MFI Final    Donor HLA-B Antigen #2 10/25/2023 B57   Final    Anti-Donor HLA-B #2 MFI 10/25/2023 110  <1000 MFI Final    Donor HLA-C Antigen #1 10/25/2023 C17   Final    Anti-Donor HLA-C #1 MFI 10/25/2023 0  <1000 MFI Final    Donor HLA-C Antigen #2 10/25/2023 C18   Final    Anti-Donor HLA-C #2 MFI 10/25/2023 0  <1000 MFI Final    Donor HLA-DR Antigen #1 10/25/2023 DR18   Final    Anti-Donor HLA-DR #1 MFI 10/25/2023 0  <1000 MFI Final    Donor HLA-DR Antigen #2 10/25/2023 DR16   Final    Anti-Donor HLA-DR #2 MFI 10/25/2023 0  <1000 MFI Final    Donor DRw Antigen #1 10/25/2023 DR52   Final    Anti-Donor DRw #1 MFI 10/25/2023 130  <1000 MFI Final    Donor DRw Antigen #2 10/25/2023 DR51   Final    Anti-Donor DRw #2 MFI 10/25/2023 16  <1000 MFI Final    Donor HLA-DQB Antigen #1 10/25/2023 DQ4   Final    Anti-Donor HLA-DQB #1 MFI 10/25/2023 37 <1000 MFI Final    Donor HLA-DQB Antigen #2 10/25/2023 DQ5   Final    Anti-Donor HLA-DQB #2 MFI 10/25/2023 8  <1000 MFI Final    DSA Comment 10/25/2023 Unable to determine DP DSA status due to unavailability of donor DP HLA type.   Final    HLA Class 2 Antibody Result 10/25/2023 Positive   Final    HLA Class 2 Antibodies Identified 10/25/2023 [id number]   Final    HLA Class 2 Antibody Comment 10/25/2023 Likely non-specific binding observed in test.  Interpret with caution. Please call HLA lab [phone] for any questions.   Final    HLA Class 1 Antibody Result 10/25/2023 Negative   Final    HLA Class 1 Antibody Comment 10/25/2023    Final   Documentation on 09/27/2023   Component Date Value Ref Range Status  Vitamin D Total (25OH) 09/27/2023 66  30 - 100 ng/mL Final   Documentation on 09/27/2023   Component Date Value Ref Range Status    Tacrolimus, Trough 09/27/2023 6.9  5.0 - 20.0 mcg/L Final   Documentation on 09/27/2023   Component Date Value Ref Range Status    WBC 09/27/2023 7.9  3.8 - 10.8 Thousand/uL Final    RBC 09/27/2023 3.98 (L)  4.20 - 5.80 Million/uL Final    HGB 09/27/2023 11.4 (L)  13.2 - 17.1 g/dL Final    HCT 16/06/9603 34.7 (L)  38.5 - 50.0 % Final    MCV 09/27/2023 87.2  80.0 - 100.0 fL Final    MCH 09/27/2023 28.6  27.0 - 33.0 pg Final    MCHC 09/27/2023 32.9  32.0 - 36.0 g/dL Final    RDW 54/05/8118 13.1  11.0 - 15.0 % Final    Platelet 09/27/2023 296  140 - 400 Thousand/uL Final    MPV 09/27/2023 10.4  7.5 - 12.5 fL Final    Absolute Neutrophils 09/27/2023 6,249  1,500 - 7,800 cells/uL Final    Absolute Lymphocytes 09/27/2023 1,138  850 - 3,900 cells/uL Final    Absolute Monocytes 09/27/2023 395  200 - 950 cells/uL Final    Absolute Eosinophils 09/27/2023 79  15 - 500 cells/uL Final    Absolute Basophils 09/27/2023 40  0 - 200 cells/uL Final    Neutrophils % 09/27/2023 79.1  % Final    Lymphocytes % 09/27/2023 14.4  % Final    Monocytes % 09/27/2023 5.0  % Final    Eosinophils % 09/27/2023 1.0 % Final    Basophils % 09/27/2023 0.5  % Final   Documentation on 09/27/2023   Component Date Value Ref Range Status    TSH 09/27/2023 1.88  0.40 - 4.50 mIU/L Final   Documentation on 09/27/2023   Component Date Value Ref Range Status    Phosphorus 09/27/2023 2.6  2.1 - 4.3 mg/dL Final   Documentation on 09/27/2023   Component Date Value Ref Range Status    Magnesium 09/27/2023 1.6  1.5 - 2.5 mg/dL Final   Documentation on 09/27/2023   Component Date Value Ref Range Status    Hemoglobin A1C 09/27/2023 6.5 (H)  <5.7 % Final   Documentation on 09/27/2023   Component Date Value Ref Range Status    Glucose 09/27/2023 111 (H)  65 - 99 mg/dL Final    BUN 14/78/2956 27 (H)  7 - 25 mg/dL Final    Creatinine 21/30/8657 1.11  0.70 - 1.35 mg/dL Final    BUN/Creatinine Ratio 09/27/2023 24 (H)  6 - 22 (calc) Final    Sodium 09/27/2023 135  135 - 146 mmol/L Final    Potassium 09/27/2023 4.7  3.5 - 5.3 mmol/L Final    Chloride 09/27/2023 108  98 - 110 mmol/L Final    CO2 09/27/2023 19 (L)  20 - 32 mmol/L Final    Calcium 09/27/2023 9.3  8.6 - 10.3 mg/dL Final    Total Protein 09/27/2023 6.7  6.1 - 8.1 g/dL Final    Albumin 84/69/6295 4.4  3.6 - 5.1 g/dL Final    Globulin, Total 09/27/2023 2.3  1.9 - 3.7 g/dL (calc) Final    Albumin/Globulin Ratio 09/27/2023 1.9  1.0 - 2.5 (calc) Final    Total Bilirubin 09/27/2023 0.6  0.2 - 1.2 mg/dL Final    Alkaline Phosphatase 09/27/2023 86  35 - 144 U/L Final    AST 09/27/2023 7 (L)  10 - 35 U/L Final    ALT 09/27/2023 8 (L)  9 - 46 U/L Final    EGFR CKD-EPI Non-African American,* 09/27/2023 73  >=60 mL/min/1.73 m2 Final   Documentation on 09/27/2023   Component Date Value Ref Range Status    Cholesterol 09/27/2023 96  <200 mg/dL Final    HDL 16/06/9603 24 (L)  >=40 mg/dL Final    Triglycerides 09/27/2023 80  <150 mg/dL Final    LDL Calculated 09/27/2023 56  <100 mg/dL (calc) Final    Chol/HDL Ratio 09/27/2023 4.0  <5.4 (calc) Final    Non-HDL Cholesterol 09/27/2023 72  mg/dL (calc) Final There may be more visits with results that are not included.         Immunosuppression:   Tac: 1 mg/0.5 mg  Tac: 5-8    Changes: None  Next Labs: 3 Months  RTC: 6 months

## 2023-11-12 ENCOUNTER — Other Ambulatory Visit: Payer: Self-pay | Admitting: Family Medicine

## 2023-11-21 ENCOUNTER — Ambulatory Visit (INDEPENDENT_AMBULATORY_CARE_PROVIDER_SITE_OTHER): Payer: Medicare HMO | Admitting: Family Medicine

## 2023-11-21 ENCOUNTER — Encounter: Payer: Self-pay | Admitting: Family Medicine

## 2023-11-21 VITALS — BP 132/62 | HR 94 | Temp 97.2°F | Ht 68.0 in | Wt 207.0 lb

## 2023-11-21 DIAGNOSIS — Z941 Heart transplant status: Secondary | ICD-10-CM | POA: Diagnosis not present

## 2023-11-21 DIAGNOSIS — Z7984 Long term (current) use of oral hypoglycemic drugs: Secondary | ICD-10-CM | POA: Diagnosis not present

## 2023-11-21 DIAGNOSIS — I10 Essential (primary) hypertension: Secondary | ICD-10-CM | POA: Diagnosis not present

## 2023-11-21 DIAGNOSIS — E119 Type 2 diabetes mellitus without complications: Secondary | ICD-10-CM | POA: Diagnosis not present

## 2023-11-21 DIAGNOSIS — E034 Atrophy of thyroid (acquired): Secondary | ICD-10-CM | POA: Diagnosis not present

## 2023-11-21 DIAGNOSIS — E1169 Type 2 diabetes mellitus with other specified complication: Secondary | ICD-10-CM

## 2023-11-21 DIAGNOSIS — M79672 Pain in left foot: Secondary | ICD-10-CM

## 2023-11-21 DIAGNOSIS — E785 Hyperlipidemia, unspecified: Secondary | ICD-10-CM

## 2023-11-21 MED ORDER — CHLORTHALIDONE 25 MG PO TABS
12.5000 mg | ORAL_TABLET | Freq: Every day | ORAL | 3 refills | Status: DC
Start: 2023-11-21 — End: 2024-05-28

## 2023-11-21 NOTE — Addendum Note (Signed)
 Addended by: Shelva Majestic on: 11/21/2023 10:45 AM   Modules accepted: Level of Service

## 2023-11-21 NOTE — Progress Notes (Addendum)
 Phone [phone] In person visit   Subjective:   [name] is a 68 y.o. year old very pleasant male patient who presents for/with See problem oriented charting Chief Complaint  Patient presents with   Medical Management of Chronic Issues   Diabetes    DEE not scheduled.   Past Medical History-  Patient Active Problem List   Diagnosis Date Noted   Immunosuppressed status (HCC) 02/23/2019    Priority: High   Heart transplanted (HCC) 06/19/2014    Priority: High   Chronic systolic heart failure (HCC) 03/02/2009    Priority: High   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    Priority: High   AUTOMATIC IMPLANTABLE CARDIAC DEFIBRILLATOR SITU 02/26/2009    Priority: High   Diabetes mellitus type II, controlled (HCC) 12/13/2008    Priority: High   Coronary atherosclerosis 04/25/2007    Priority: High   Chronic abdominal pain 12/27/2015    Priority: Medium    Benign prostatic hyperplasia 02/26/2014    Priority: Medium    Hypothyroidism 05/14/2008    Priority: Medium    Essential hypertension 05/14/2008    Priority: Medium    ANEMIA, OTHER UNSPEC 04/03/2008    Priority: Medium    Hyperlipidemia associated with type 2 diabetes mellitus (HCC) 04/25/2007    Priority: Medium    GERD (gastroesophageal reflux disease) 06/19/2014    Priority: Low   Osteoarthritis 06/19/2014    Priority: Low   History of adenomatous polyp of colon 09/17/2013    Priority: Low   BPH with obstruction/lower urinary tract symptoms 06/21/2023   Nausea and vomiting 05/18/2023    Medications- reviewed and updated Current Outpatient Medications  Medication Sig Dispense Refill   ACCU-CHEK GUIDE TEST test strip USE 1  4 TIMES DAILY 100 each 0   amLODipine (NORVASC) 10 MG tablet Take 10 mg by mouth in the morning.     aspirin EC 81 MG tablet Take 1 tablet (81 mg total) by mouth daily. 30 tablet 0   atorvastatin (LIPITOR) 80 MG tablet Take 1 tablet (80 mg total) by mouth daily at 6 PM. 30 tablet 0    blood glucose meter kit and supplies KIT Dispense based on patient and insurance preference. Use up to four times daily as directed. 1 each 0   cholecalciferol (VITAMIN D) 1000 units tablet Take 3 tablets (3,000 Units total) by mouth daily. 90 tablet 0   diltiazem (TIAZAC) 180 MG 24 hr capsule Take 2 capsules (360 mg total) by mouth daily. (Patient taking differently: Take 360 mg by mouth every evening.) 60 capsule 0   ezetimibe (ZETIA) 10 MG tablet Take 1 tablet by mouth once daily 90 tablet 0   famotidine (PEPCID) 20 MG tablet Take 20 mg by mouth in the morning and at bedtime.     HYDROcodone-acetaminophen (NORCO/VICODIN) 5-325 MG tablet Take 1 tablet by mouth every 6 (six) hours as needed for moderate pain (pain score 4-6). 15 tablet 0   levothyroxine (SYNTHROID, LEVOTHROID) 75 MCG tablet Take 1 tablet (75 mcg total) by mouth daily before breakfast. 30 tablet 0   magnesium oxide (MAG-OX) 400 (240 Mg) MG tablet Take 400 mg by mouth in the morning.     magnesium oxide (MAG-OX) 400 MG tablet Take 1.5 tablets (600 mg total) by mouth 2 (two) times daily. (Patient taking differently: Take 400 mg by mouth daily.) 45 tablet 0   metFORMIN (GLUCOPHAGE) 1000 MG tablet TAKE 1 TABLET BY MOUTH TWICE DAILY WITH MEALS 180 tablet 0  mycophenolate (CELLCEPT) 500 MG tablet Take 1 tablet (500 mg total) by mouth 2 (two) times daily. 60 tablet 0   tacrolimus (PROGRAF) 0.5 MG capsule Take 2 capsules (1 mg total) by mouth 2 (two) times daily. (Patient taking differently: Take 0.5-1 mg by mouth See admin instructions. Take 2 capsules (1 mg) by mouth in the morning & take 1 capsule (0.5 mg) by mouth in the evening.) 120 capsule 0   valsartan (DIOVAN) 320 MG tablet Take 1 tablet (320 mg total) by mouth daily. 90 tablet 3   No current facility-administered medications for this visit.     Objective:  BP 132/62   Pulse 94   Temp (!) 97.2 F (36.2 C)   Ht 5\' 8"  (1.727 m)   Wt 207 lb (93.9 kg)   SpO2 97%   BMI 31.47  kg/m  Gen: NAD, resting comfortably CV: RRR no murmurs rubs or gallops Lungs: CTAB no crackles, wheeze, rhonchi Skin: warm, dry 1+ edema up from trace     Assessment and Plan   #Left foot pain- saw [facility] but thankfully eventually resolved and didn't need CT scan or physical therapy  - even though feeling better still gets pain at times in the arch and hed like to see podiatry- referral will be made today  #Heart transplant/immunosuppressed-patient follows up closely with [facility] S: Medication: Remains on mycophenolate 500 mg twice daily  and tacrolimus 0.5 mg twice daily (1 in am and 1 at night) - no chest pain or shortness of breath  -reports had stress test that was reassuring A/P: heart transplant/immunosuppressed status- overall doing well with both- continue to monitor. Just had labs in late January that were reassuring   #CAD  #hyperlipidemia S: Medication:Atorvastatin 80 mg, Zetia 10 mg, aspirin 81 mg  -no chest pain or shortness of breath  Lab Results  Component Value Date   CHOL 144 09/15/2022   HDL 41 09/15/2022   LDLCALC 78 09/15/2022   LDLDIRECT 69 02/12/2014   TRIG 127 09/15/2022   CHOLHDL 5.2 CALC 03/05/2008  A/P: CAD-asymptomatic continue current medications  Hyperlipidemia-most recent LDL down to 56 on January 81XB- team will abstract.    # Diabetes S: Medication:metformin 1000 mg twice daily prescription but he was taking 3x a day- advised to cut back  CBGs- 90's to 120s Lab Results  Component Value Date   HGBA1C 6.5 09/27/2023   HGBA1C 6.1 05/20/2023   HGBA1C 6.4 09/15/2022  A/P: a1c at goal still at 6.5- continue current medications other than cut back to twice daily metformin 1000mg   -We reviewed prior lab error  with [facility] in regards to micromanage creatinine ratio being off factor of 10-he was rather ill at that time so we want to recheck today but adjusting the factor X score would be over 500 unfortunately-discussed possibly adding  Jardiance but want to see where things stand first   #CHF  #hypertension S: medication: Valsartan 320 mg, amlodipine 10 mg, diltiazem 360 mg extended release -prior chlorthalidone- stopped in past with dehydration concerns along with metformin -Lasix usage:not needing- weight pretty stable around 200 and up to 203  Home readings #s: a little higher than here and at [facility] but that cuff tends to run higher by prior comparisons BP Readings from Last 3 Encounters:  11/21/23 132/62  08/15/23 (!) 151/82  07/21/23 (!) 150/60  A/P: blood pressure looks better today- continue current medications- other than with  Heart failure and  weight being  up slightly  as is edema to 1+  off the chlorthalidone- we opted to restart chlorthalidone at 12.5 mg (half of 25 mg which was prior dose) to try stabilize edema issues -creatinine at 1.1 with GFR in 70s on most recent check   #hypothyroidism S: compliant On thyroid medication-levothyroxine 75 mcg Lab Results  Component Value Date   TSH 2.93 05/20/2023  A/P:TSH stable on 09/27/23 at [facility]- continue current medications      #GERD-Pepcid controls for the most part  Recommended follow up: Return in about 4 months (around 03/22/2024) for physical or sooner if needed.Schedule b4 you leave.  Lab/Order associations:   ICD-10-CM   1. Controlled type 2 diabetes mellitus without complication, without long-term current use of insulin (HCC)  E11.9     2. Essential hypertension  I10     3. Hypothyroidism due to acquired atrophy of thyroid  E03.4     4. Hyperlipidemia associated with type 2 diabetes mellitus (HCC)  E11.69    E78.5     5. Heart transplant status (HCC) Chronic Z94.1     6. Left foot pain  M79.672       No orders of the defined types were placed in this encounter.   Return precautions advised.  Tana Conch, MD

## 2023-11-21 NOTE — Patient Instructions (Addendum)
 Get Diabetic Eye Exam scheduled and have them send [place] a copy  We have placed a referral for you today to podiatry- please call their # if you do not hear within a week (may be listed below or you may see mychart message within a few days with #).   Make sure to take just 1000 mg twice a day of metformin- the 3x a day was for the 500 mg version  blood pressure looks better today- continue current medications- other than with  Heart failure and  weight being  up slightly as is edema to 1+  off the chlorthalidone- we opted to restart chlorthalidone at 12.5 mg (half of 25 mg which was prior dose) to try stabilize edema issues  Team please get him a cup - he is going to need to drop urine back off   Recommended follow up: Return in about 4 months (around 03/22/2024) for physical or sooner if needed.Schedule b4 you leave.

## 2023-11-29 ENCOUNTER — Encounter: Payer: Self-pay | Admitting: Podiatry

## 2023-11-29 ENCOUNTER — Ambulatory Visit: Admitting: Podiatry

## 2023-11-29 DIAGNOSIS — M722 Plantar fascial fibromatosis: Secondary | ICD-10-CM

## 2023-11-29 DIAGNOSIS — M76822 Posterior tibial tendinitis, left leg: Secondary | ICD-10-CM

## 2023-11-29 MED ORDER — TRIAMCINOLONE ACETONIDE 40 MG/ML IJ SUSP
20.0000 mg | Freq: Once | INTRAMUSCULAR | Status: AC
  Administered 2023-11-29: 20 mg

## 2023-11-29 NOTE — Progress Notes (Signed)
 Subjective:  Patient ID: [name], male    DOB: Mar 13, 1956,  MRN: [id number] HPI Chief Complaint  Patient presents with   Foot Pain    Medial foot left - intermittent pain x 4 months, few days ago started more constant, went to Urgent Care - xrayed, gave hydrocodone, diabetic - 6.5   New Patient (Initial Visit)    68 y.o. male presents with the above complaint.   ROS: Denies fever chills nausea vomit muscle aches pains calf pain back pain chest pain shortness of breath.  Past Medical History:  Diagnosis Date   Abdominal pain, epigastric 03/31/2009   Acute on chronic systolic heart failure (HCC) 03/02/2009   ANEMIA, OTHER UNSPEC 04/03/2008   AUTOMATIC IMPLANTABLE CARDIAC DEFIBRILLATOR SITU 02/26/2009   on old heart   Blood transfusion without reported diagnosis    CHF (congestive heart failure) (HCC)    Chronic kidney disease    CORONARY ARTERY DISEASE 04/25/2007   DIABETES MELLITUS, TYPE II 12/13/2008   no meds, diet contolled   GERD (gastroesophageal reflux disease)    HYPERLIPIDEMIA 04/25/2007   Hypertension    Hypothyroidism    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   MYOCARDIAL INFARCTION, 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   OBESITY 02/26/2009   THYROID DISEASE, HX OF 05/14/2008   VENTRICULAR TACHYCARDIA 02/26/2009   Past Surgical History:  Procedure Laterality Date   ANGIOPLASTY     with stent   CARDIAC ASSIST DEVICE REMOVAL     CARDIAC CATHETERIZATION     COLONOSCOPY     CORONARY ARTERY BYPASS GRAFT     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   LEFT VENTRICULAR ASSIST DEVICE  05/2009   [facility]   TRANSURETHRAL RESECTION OF PROSTATE N/A 06/21/2023   Procedure: TRANSURETHRAL RESECTION OF THE PROSTATE (TURP);  Surgeon: Noel Christmas, MD;  Location: [facility];  Service: Urology;  Laterality: N/A;  60 MINUTES    Current Outpatient Medications:    tamsulosin (FLOMAX) 0.4 MG CAPS capsule, Take 0.4 mg by mouth at bedtime., Disp: , Rfl:    ACCU-CHEK GUIDE TEST test strip, USE 1  4 TIMES DAILY,  Disp: 100 each, Rfl: 0   amLODipine (NORVASC) 10 MG tablet, Take 10 mg by mouth in the morning., Disp: , Rfl:    aspirin EC 81 MG tablet, Take 1 tablet (81 mg total) by mouth daily., Disp: 30 tablet, Rfl: 0   atorvastatin (LIPITOR) 80 MG tablet, Take 1 tablet (80 mg total) by mouth daily at 6 PM., Disp: 30 tablet, Rfl: 0   blood glucose meter kit and supplies KIT, Dispense based on patient and insurance preference. Use up to four times daily as directed., Disp: 1 each, Rfl: 0   chlorthalidone (HYGROTON) 25 MG tablet, Take 0.5 tablets (12.5 mg total) by mouth daily., Disp: 45 tablet, Rfl: 3   cholecalciferol (VITAMIN D) 1000 units tablet, Take 3 tablets (3,000 Units total) by mouth daily., Disp: 90 tablet, Rfl: 0   diltiazem (TIAZAC) 180 MG 24 hr capsule, Take 2 capsules (360 mg total) by mouth daily. (Patient taking differently: Take 360 mg by mouth every evening.), Disp: 60 capsule, Rfl: 0   ezetimibe (ZETIA) 10 MG tablet, Take 1 tablet by mouth once daily, Disp: 90 tablet, Rfl: 0   famotidine (PEPCID) 20 MG tablet, Take 20 mg by mouth in the morning and at bedtime., Disp: , Rfl:    HYDROcodone-acetaminophen (NORCO/VICODIN) 5-325 MG tablet, Take 1 tablet by mouth every 6 (six) hours as needed for moderate pain (pain score  4-6)., Disp: 15 tablet, Rfl: 0   levothyroxine (SYNTHROID, LEVOTHROID) 75 MCG tablet, Take 1 tablet (75 mcg total) by mouth daily before breakfast., Disp: 30 tablet, Rfl: 0   magnesium oxide (MAG-OX) 400 (240 Mg) MG tablet, Take 400 mg by mouth in the morning., Disp: , Rfl:    magnesium oxide (MAG-OX) 400 MG tablet, Take 1.5 tablets (600 mg total) by mouth 2 (two) times daily. (Patient taking differently: Take 400 mg by mouth daily.), Disp: 45 tablet, Rfl: 0   metFORMIN (GLUCOPHAGE) 1000 MG tablet, TAKE 1 TABLET BY MOUTH TWICE DAILY WITH MEALS, Disp: 180 tablet, Rfl: 0   mycophenolate (CELLCEPT) 500 MG tablet, Take 1 tablet (500 mg total) by mouth 2 (two) times daily., Disp: 60  tablet, Rfl: 0   tacrolimus (PROGRAF) 0.5 MG capsule, Take 2 capsules (1 mg total) by mouth 2 (two) times daily. (Patient taking differently: Take 0.5-1 mg by mouth See admin instructions. Take 2 capsules (1 mg) by mouth in the morning & take 1 capsule (0.5 mg) by mouth in the evening.), Disp: 120 capsule, Rfl: 0   valsartan (DIOVAN) 320 MG tablet, Take 1 tablet (320 mg total) by mouth daily., Disp: 90 tablet, Rfl: 3  No Known Allergies Review of Systems Objective:  There were no vitals filed for this visit.  General: Well developed, nourished, in no acute distress, alert and oriented x3   Dermatological: Skin is warm, dry and supple bilateral. Nails x 10 are well maintained; remaining integument appears unremarkable at this time. There are no open sores, no preulcerative lesions, no rash or signs of infection present.  Vascular: Dorsalis Pedis artery and Posterior Tibial artery pedal pulses are 2/4 bilateral with immedate capillary fill time. Pedal hair growth present. No varicosities and no lower extremity edema present bilateral.   Neruologic: Grossly intact via light touch bilateral. Vibratory intact via tuning fork bilateral. Protective threshold with Semmes Wienstein monofilament intact to all pedal sites bilateral. Patellar and Achilles deep tendon reflexes 2+ bilateral. No Babinski or clonus noted bilateral.   Musculoskeletal: No gross boney pedal deformities bilateral. No pain, crepitus, or limitation noted with foot and ankle range of motion bilateral. Muscular strength 5/5 in all groups tested bilateral.  Pain on palpation of the posterior tibial tendon left with mild edema overlying this area.  There is fluctuance on palpation of the tendon beneath the medial malleolus extending to the navicular tuberosity.  Unable to reproduce pain to the right heel.  Gait: Unassisted, Nonantalgic.    Radiographs:  Reviewed radiographs that are already in epic demonstrating no significant osseous  abnormalities.  No acute findings.  Assessment & Plan:   Assessment: Posterior tibial tendinitis left.  Plan: Discussed etiology pathology conservative surgical therapies at this point I injected the area with 10 mg Kenalog, Marcaine point maximal tenderness.  Placed him in a cam boot.  He does not want to take medication I will follow-up with him in 1 month May need to consider MRI at that time.     Rue Tinnel T. [place], [place]

## 2023-11-30 ENCOUNTER — Other Ambulatory Visit (INDEPENDENT_AMBULATORY_CARE_PROVIDER_SITE_OTHER)

## 2023-11-30 ENCOUNTER — Encounter: Payer: Self-pay | Admitting: Family Medicine

## 2023-11-30 DIAGNOSIS — R3914 Feeling of incomplete bladder emptying: Secondary | ICD-10-CM | POA: Diagnosis not present

## 2023-11-30 DIAGNOSIS — E119 Type 2 diabetes mellitus without complications: Secondary | ICD-10-CM | POA: Diagnosis not present

## 2023-11-30 DIAGNOSIS — N401 Enlarged prostate with lower urinary tract symptoms: Secondary | ICD-10-CM | POA: Diagnosis not present

## 2023-11-30 LAB — MICROALBUMIN / CREATININE URINE RATIO
Creatinine,U: 51.3 mg/dL
Microalb Creat Ratio: 26.1 mg/g (ref 0.0–30.0)
Microalb, Ur: 1.3 mg/dL (ref 0.0–1.9)

## 2023-12-01 NOTE — Unmapped (Signed)
 [facility] Refill Coordination Note    Specialty Medication(s) to be Shipped:   Transplant: tacrolimus 0.5mg     Other medication(s) to be shipped:  levothyroxine, valsartan, diltiazem     Andrew Dickerson, DOB: 01-25-1956  Phone: There are no phone numbers on file.      All above HIPAA information was verified with patient.     Was a Nurse, learning disability used for this call? No    Completed refill call assessment today to schedule patient's medication shipment from the [facility] Delivery [facility]  [phone]).  All relevant notes have been reviewed.     Specialty medication(s) and dose(s) confirmed: Regimen is correct and unchanged.   Changes to medications: Yaacov reports no changes at this time.  Changes to insurance: No  New side effects reported not previously addressed with a pharmacist or physician: None reported  Questions for the pharmacist: No    Confirmed patient received a Conservation officer, historic buildings and a Surveyor, mining with first shipment. The patient will receive a drug information handout for each medication shipped and additional FDA Medication Guides as required.       DISEASE/MEDICATION-SPECIFIC INFORMATION        N/A    SPECIALTY MEDICATION ADHERENCE     Medication Adherence    Patient reported X missed doses in the last month: 0  Specialty Medication: tacrolimus 0.5 MG capsule (PROGRAF)  Patient is on additional specialty medications: No  Patient is on more than two specialty medications: No  Any gaps in refill history greater than 2 weeks in the last 3 months: no  Demonstrates understanding of importance of adherence: yes  Informant: patient  Confirmed plan for next specialty medication refill: delivery by pharmacy  Refills needed for supportive medications: not needed          Refill Coordination    Has the Patients' Contact Information Changed: No  Is the Shipping Address Different: No         Were doses missed due to medication being on hold? No    tacrolimus 0.5  mg: 10 days of medicine on hand       REFERRAL TO PHARMACIST     Referral to the pharmacist: Not needed      [facility]     Shipping address confirmed in Epic.     Cost and Payment: Patient has a copay of $9.55. They are aware and have authorized the pharmacy to charge the credit card on file.    Delivery Scheduled: Yes, Expected medication delivery date: 12/05/23.     Medication will be delivered via UPS to the prescription address in Epic WAM.    Kerby Less   [facility]  Specialty Technician

## 2023-12-02 MED FILL — DILTIAZEM CD 180 MG CAPSULE,EXTENDED RELEASE 24 HR: ORAL | 30 days supply | Qty: 60 | Fill #0

## 2023-12-02 MED FILL — LEVOTHYROXINE 75 MCG TABLET: ORAL | 30 days supply | Qty: 30 | Fill #0

## 2023-12-02 MED FILL — VALSARTAN 160 MG TABLET: ORAL | 90 days supply | Qty: 180 | Fill #0

## 2023-12-02 MED FILL — TACROLIMUS 0.5 MG CAPSULE, IMMEDIATE-RELEASE: ORAL | 30 days supply | Qty: 90 | Fill #0

## 2023-12-13 DIAGNOSIS — R351 Nocturia: Secondary | ICD-10-CM | POA: Diagnosis not present

## 2023-12-13 DIAGNOSIS — R3914 Feeling of incomplete bladder emptying: Secondary | ICD-10-CM | POA: Diagnosis not present

## 2023-12-13 DIAGNOSIS — N401 Enlarged prostate with lower urinary tract symptoms: Secondary | ICD-10-CM | POA: Diagnosis not present

## 2023-12-28 ENCOUNTER — Other Ambulatory Visit: Payer: Self-pay | Admitting: Family Medicine

## 2023-12-28 NOTE — Unmapped (Signed)
 [facility] Delivery Pharm[facility] Refill Coordination Note    Specialty Medication(s) to be Shipped:   Transplant: tacrolimus  0.5mg     Other medication(s) to be shipped:  levothyroxine  and diltiazem      Andrew Dickerson, DOB: 12-Dec-1955  Phone: There are no phone numbers on file.      All above HIPAA information was verified with patient.     Was a Nurse, learning disability used for this call? No    Completed refill call assessment today to schedule patient's medication shipment from the [facility] and [facility] Delivery Pharmacy  [phone]).  All relevant notes have been reviewed.     Specialty medication(s) and dose(s) confirmed: Regimen is correct and unchanged.   Changes to medications: Andrew Dickerson reports no changes at this time.  Changes to insurance: No  New side effects reported not previously addressed with a pharmacist or physician: None reported  Questions for the pharmacist: No    Confirmed patient received a Conservation officer, historic buildings and a Surveyor, mining with first shipment. The patient will receive a drug information handout for each medication shipped and additional FDA Medication Guides as required.       DISEASE/MEDICATION-SPECIFIC INFORMATION        N/A    SPECIALTY MEDICATION ADHERENCE     Medication Adherence    Patient reported X missed doses in the last month: 0  Specialty Medication: tacrolimus  0.5 MG capsule (PROGRAF )  Patient is on additional specialty medications: No  Patient is on more than two specialty medications: No  Any gaps in refill history greater than 2 weeks in the last 3 months: no  Demonstrates understanding of importance of adherence: yes  Informant: patient  Confirmed plan for next specialty medication refill: delivery by pharmacy  Refills needed for supportive medications: not needed          Refill Coordination    Has the Patients' Contact Information Changed: No  Is the Shipping Address Different: No         Were doses missed due to medication being on hold? No    tacrolimus  0.5 mg: 14 days of medicine on hand       REFERRAL TO PHARMACIST     Referral to the pharmacist: Not needed      [facility]     Shipping address confirmed in Epic.     Cost and Payment: Patient has a copay of $9.55. They are aware and have authorized the pharmacy to charge the credit card on file.    Delivery Scheduled: Yes, Expected medication delivery date: 01/05/24.     Medication will be delivered via UPS to the prescription address in Epic WAM.    Loretta Romp   [facility]  Specialty Technician

## 2024-01-03 ENCOUNTER — Ambulatory Visit: Admitting: Podiatry

## 2024-01-03 ENCOUNTER — Encounter: Payer: Self-pay | Admitting: Podiatry

## 2024-01-03 DIAGNOSIS — M66872 Spontaneous rupture of other tendons, left ankle and foot: Secondary | ICD-10-CM

## 2024-01-03 NOTE — Progress Notes (Signed)
 He presents today for follow-up of his PTT tendon.  He states that really feels like it has not gotten any better but pain is not severe is just sharp shooting.  He has finished up his cam boot anti-inflammatories massage and physical therapies.  No avail.  Objective: Vital signs stable oriented x 3.  Left foot still demonstrates considerable soreness in the posterior tibial tendon navicular insertion.  Assessment: Probable tear of the posterior tibial tendon at the navicular insertion.  Request a CT he has a wire in his heart that he has since childhood.  No MRIs allowed.

## 2024-01-05 MED FILL — LEVOTHYROXINE 75 MCG TABLET: ORAL | 30 days supply | Qty: 30 | Fill #1

## 2024-01-05 MED FILL — TACROLIMUS 0.5 MG CAPSULE, IMMEDIATE-RELEASE: ORAL | 30 days supply | Qty: 90 | Fill #1

## 2024-01-05 MED FILL — DILTIAZEM CD 180 MG CAPSULE,EXTENDED RELEASE 24 HR: ORAL | 30 days supply | Qty: 60 | Fill #1

## 2024-01-12 DIAGNOSIS — R351 Nocturia: Secondary | ICD-10-CM | POA: Diagnosis not present

## 2024-01-12 DIAGNOSIS — N401 Enlarged prostate with lower urinary tract symptoms: Secondary | ICD-10-CM | POA: Diagnosis not present

## 2024-01-12 DIAGNOSIS — R3914 Feeling of incomplete bladder emptying: Secondary | ICD-10-CM | POA: Diagnosis not present

## 2024-01-23 ENCOUNTER — Ambulatory Visit
Admission: RE | Admit: 2024-01-23 | Discharge: 2024-01-23 | Disposition: A | Discharge status: Home / Self Care | Source: Ambulatory Visit | Attending: Podiatry | Admitting: Podiatry

## 2024-01-23 ENCOUNTER — Other Ambulatory Visit: Payer: Self-pay | Admitting: Family Medicine

## 2024-01-23 DIAGNOSIS — M66872 Spontaneous rupture of other tendons, left ankle and foot: Secondary | ICD-10-CM

## 2024-01-23 DIAGNOSIS — R600 Localized edema: Secondary | ICD-10-CM | POA: Diagnosis not present

## 2024-01-23 DIAGNOSIS — R7309 Other abnormal glucose: Secondary | ICD-10-CM

## 2024-01-23 DIAGNOSIS — M7732 Calcaneal spur, left foot: Secondary | ICD-10-CM | POA: Diagnosis not present

## 2024-01-23 DIAGNOSIS — R936 Abnormal findings on diagnostic imaging of limbs: Secondary | ICD-10-CM | POA: Diagnosis not present

## 2024-02-06 MED ORDER — ATORVASTATIN 80 MG TABLET
ORAL_TABLET | Freq: Every day | ORAL | 3 refills | 90.00000 days | Status: CP
Start: 2024-02-06 — End: 2025-02-05
  Filled 2024-02-09: qty 90, 90d supply, fill #0

## 2024-02-06 NOTE — Unmapped (Signed)
 [facility] Delivery Pharm[facility] Refill Coordination Note    Specialty Medication(s) to be Shipped:   Transplant: mycophenolate mofetil 500mg  and tacrolimus 0.5mg     Other medication(s) to be shipped: dilTIAZem 180 MG 24 hr capsule (CARDIZEM CD),levothyroxine 75 MCG tablet (SYNTHROID),amlodipine 10 MG tablet (NORVASC),aspirin 81 MG tablet (ADULT LOW DOSE ASPIRIN),atorvastatin 80 MG tablet (LIPITOR),famotidine 20 MG tablet (PEPCID)     Andrew Dickerson, DOB: [birth year]  Phone: There are no phone numbers on file.      All above HIPAA information was verified with patient.     Was a Nurse, learning disability used for this call? No    Completed refill call assessment today to schedule patient's medication shipment from the [facility] and [facility] Delivery Pharm[facility]  [phone]).  All relevant notes have been reviewed.     Specialty medication(s) and dose(s) confirmed: Regimen is correct and unchanged.   Changes to medications: Andrew Dickerson reports no changes at this time.  Changes to insurance: No  New side effects reported not previously addressed with a pharmacist or physician: None reported  Questions for the pharmacist: No    Confirmed patient received a Conservation officer, historic buildings and a Surveyor, mining with first shipment. The patient will receive a drug information handout for each medication shipped and additional FDA Medication Guides as required.       DISEASE/MEDICATION-SPECIFIC INFORMATION        N/A    SPECIALTY MEDICATION ADHERENCE     Medication Adherence    Patient reported X missed doses in the last month: 0  Specialty Medication: mycophenolate 500 mg tablet (CELLCEPT)  Patient is on additional specialty medications: Yes  Additional Specialty Medications: tacrolimus 0.5 MG capsule (PROGRAF)  Patient Reported Additional Medication X Missed Doses in the Last Month: 0  Patient is on more than two specialty medications: No              Were doses missed due to medication being on hold? No    mycophenolate 500 mg tablet (CELLCEPT): 14 days of medicine on hand   tacrolimus 0.5 MG capsule (PROGRAF): 14 days of medicine on hand       REFERRAL TO PHARMACIST     Referral to the pharmacist: Not needed      [facility]     Shipping address confirmed in Epic.     Cost and Payment: Patient has a copay of $35.69. They are aware and have authorized the pharmacy to charge the credit card on file.    Delivery Scheduled: Yes, Expected medication delivery date: 02/10/24.  However, Rx request for refills was sent to the provider as there are none remaining.     Medication will be delivered via UPS to the prescription address in Epic WAM.    [name]   [facility] and Home Delivery Pharm[facility]  Specialty Technician

## 2024-02-09 MED FILL — ASPIRIN 81 MG TABLET,DELAYED RELEASE: ORAL | 90 days supply | Qty: 90 | Fill #2

## 2024-02-09 MED FILL — TACROLIMUS 0.5 MG CAPSULE, IMMEDIATE-RELEASE: ORAL | 30 days supply | Qty: 90 | Fill #2

## 2024-02-09 MED FILL — AMLODIPINE 10 MG TABLET: ORAL | 90 days supply | Qty: 90 | Fill #2

## 2024-02-09 MED FILL — FAMOTIDINE 20 MG TABLET: ORAL | 90 days supply | Qty: 180 | Fill #2

## 2024-02-09 MED FILL — DILTIAZEM CD 180 MG CAPSULE,EXTENDED RELEASE 24 HR: ORAL | 30 days supply | Qty: 60 | Fill #2

## 2024-02-09 MED FILL — LEVOTHYROXINE 75 MCG TABLET: ORAL | 30 days supply | Qty: 30 | Fill #2

## 2024-02-09 MED FILL — MYCOPHENOLATE MOFETIL 500 MG TABLET: ORAL | 90 days supply | Qty: 180 | Fill #2

## 2024-02-23 ENCOUNTER — Ambulatory Visit: Payer: Self-pay | Admitting: Podiatry

## 2024-03-05 DIAGNOSIS — Z941 Heart transplant status: Principal | ICD-10-CM

## 2024-03-05 DIAGNOSIS — Z79899 Other long term (current) drug therapy: Principal | ICD-10-CM

## 2024-03-07 NOTE — Unmapped (Signed)
 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      All above HIPAA information was verified with patient.     Was a Nurse, learning disability used for this call? No    Completed refill call assessment today to schedule patient's medication shipment from the [facility]  [phone]).  All relevant notes have been reviewed.     Specialty medication(s) and dose(s) confirmed: Regimen is correct and unchanged.   Changes to medications: Ryaan reports no changes at this time.  Changes to insurance: No  New side effects reported not previously addressed with a pharmacist or physician: None reported  Questions for the pharmacist: No    Confirmed patient received a Conservation officer, historic buildings and a Surveyor, mining with first shipment. The patient will receive a drug information handout for each medication shipped and additional FDA Medication Guides as required.       DISEASE/MEDICATION-SPECIFIC INFORMATION        N/A    SPECIALTY MEDICATION ADHERENCE     Medication Adherence    Patient reported X missed doses in the last month: 0  Specialty Medication: tacrolimus  0.5 MG capsule (PROGRAF )  Patient is on additional specialty medications: No  Patient is on more than two specialty medications: No  Any gaps in refill history greater than 2 weeks in the last 3 months: no  Demonstrates understanding of importance of adherence: yes              Were doses missed due to medication being on hold? No    tacrolimus  0.5   mg: 7 days of medicine on hand       REFERRAL TO PHARMACIST     Referral to the pharmacist: Not needed      [facility]     Shipping address confirmed in Epic.     Cost and Payment: Patient has a copay of $9.55. They are aware and have authorized the pharmacy to charge the credit card on file.    Delivery Scheduled: Yes, Expected medication delivery date: 03/13/24.     Medication will be delivered via UPS to the prescription address in Epic WAM.    Kyra Myron   [facility]  Specialty Technician

## 2024-03-12 MED FILL — LEVOTHYROXINE 75 MCG TABLET: ORAL | 30 days supply | Qty: 30 | Fill #3

## 2024-03-12 MED FILL — DILTIAZEM CD 180 MG CAPSULE,EXTENDED RELEASE 24 HR: ORAL | 30 days supply | Qty: 60 | Fill #3

## 2024-03-12 MED FILL — TACROLIMUS 0.5 MG CAPSULE, IMMEDIATE-RELEASE: ORAL | 30 days supply | Qty: 90 | Fill #3

## 2024-03-21 ENCOUNTER — Other Ambulatory Visit: Payer: Self-pay | Admitting: Family Medicine

## 2024-03-22 ENCOUNTER — Ambulatory Visit: Admitting: Podiatry

## 2024-03-22 DIAGNOSIS — E559 Vitamin D deficiency, unspecified: Secondary | ICD-10-CM | POA: Diagnosis not present

## 2024-03-22 DIAGNOSIS — Z79899 Other long term (current) drug therapy: Secondary | ICD-10-CM | POA: Diagnosis not present

## 2024-03-22 DIAGNOSIS — E785 Hyperlipidemia, unspecified: Secondary | ICD-10-CM | POA: Diagnosis not present

## 2024-03-22 DIAGNOSIS — Z941 Heart transplant status: Secondary | ICD-10-CM | POA: Diagnosis not present

## 2024-03-22 DIAGNOSIS — Z48298 Encounter for aftercare following other organ transplant: Secondary | ICD-10-CM | POA: Diagnosis not present

## 2024-03-28 ENCOUNTER — Other Ambulatory Visit: Payer: Self-pay | Admitting: Family Medicine

## 2024-03-28 DIAGNOSIS — Z1283 Encounter for screening for malignant neoplasm of skin: Secondary | ICD-10-CM | POA: Diagnosis not present

## 2024-03-28 DIAGNOSIS — D225 Melanocytic nevi of trunk: Secondary | ICD-10-CM | POA: Diagnosis not present

## 2024-03-28 DIAGNOSIS — L821 Other seborrheic keratosis: Secondary | ICD-10-CM | POA: Diagnosis not present

## 2024-03-28 LAB — CBC W/ DIFFERENTIAL
BASOPHILS ABSOLUTE COUNT: 68 {cells}/uL (ref 0–200)
BASOPHILS RELATIVE PERCENT: 0.7 %
EOSINOPHILS ABSOLUTE COUNT: 116 {cells}/uL (ref 15–500)
EOSINOPHILS RELATIVE PERCENT: 1.2 %
HEMATOCRIT: 39.7 % (ref 38.5–50.0)
HEMOGLOBIN: 12.9 g/dL — ABNORMAL LOW (ref 13.2–17.1)
LYMPHOCYTES ABSOLUTE COUNT: 1387 {cells}/uL (ref 850–3900)
LYMPHOCYTES RELATIVE PERCENT: 14.3 %
MEAN CORPUSCULAR HEMOGLOBIN CONC: 32.5 g/dL (ref 32.0–36.0)
MEAN CORPUSCULAR HEMOGLOBIN: 29.5 pg (ref 27.0–33.0)
MEAN CORPUSCULAR VOLUME: 90.8 fL (ref 80.0–100.0)
MEAN PLATELET VOLUME: 10.3 fL (ref 7.5–12.5)
MONOCYTES ABSOLUTE COUNT: 737 {cells}/uL (ref 200–950)
MONOCYTES RELATIVE PERCENT: 7.6 %
NEUTROPHILS ABSOLUTE COUNT: 7391 {cells}/uL (ref 1500–7800)
NEUTROPHILS RELATIVE PERCENT: 76.2 %
PLATELET COUNT: 271 Thousand/uL (ref 140–400)
RED BLOOD CELL COUNT: 4.37 Million/uL (ref 4.20–5.80)
RED CELL DISTRIBUTION WIDTH: 12.8 % (ref 11.0–15.0)
WHITE BLOOD CELL COUNT: 9.7 Thousand/uL (ref 3.8–10.8)

## 2024-03-28 LAB — TSH: THYROID STIMULATING HORMONE: 2 m[IU]/L (ref 0.40–4.50)

## 2024-03-28 LAB — COMPREHENSIVE METABOLIC PANEL
ALBUMIN/GLOBULIN RATIO: 2.2 (calc) (ref 1.0–2.5)
ALBUMIN: 4.6 g/dL (ref 3.6–5.1)
ALKALINE PHOSPHATASE: 93 U/L (ref 35–144)
ALT (SGPT): 11 U/L (ref 9–46)
AST (SGOT): 9 U/L — ABNORMAL LOW (ref 10–35)
BILIRUBIN TOTAL: 0.7 mg/dL (ref 0.2–1.2)
BLOOD UREA NITROGEN: 26 mg/dL — ABNORMAL HIGH (ref 7–25)
BUN / CREAT RATIO: 17 (calc) (ref 6–22)
CALCIUM: 9.8 mg/dL (ref 8.6–10.3)
CHLORIDE: 104 mmol/L (ref 98–110)
CO2: 23 mmol/L (ref 20–32)
CREATININE: 1.54 mg/dL — ABNORMAL HIGH (ref 0.70–1.35)
EGFR CKD-EPI NON-AA MALE: 49 mL/min/1.73m2 — ABNORMAL LOW
GLOBULIN, TOTAL: 2.1 g/dL (ref 1.9–3.7)
GLUCOSE RANDOM: 130 mg/dL — ABNORMAL HIGH (ref 65–99)
POTASSIUM: 5.1 mmol/L (ref 3.5–5.3)
PROTEIN TOTAL: 6.7 g/dL (ref 6.1–8.1)
SODIUM: 136 mmol/L (ref 135–146)

## 2024-03-28 LAB — VITAMIN D 25 HYDROXY: VITAMIN D, TOTAL (25OH): 59 ng/mL (ref 30–100)

## 2024-03-28 LAB — HEMOGLOBIN A1C: HEMOGLOBIN A1C: 6.9 % — ABNORMAL HIGH

## 2024-03-28 LAB — MAGNESIUM: MAGNESIUM: 1.8 mg/dL (ref 1.5–2.5)

## 2024-03-28 LAB — LIPID PANEL
CHOLESTEROL/HDL RATIO SCREEN: 3.2 (calc)
CHOLESTEROL: 121 mg/dL
HDL CHOLESTEROL: 38 mg/dL — ABNORMAL LOW
LDL CHOLESTEROL CALCULATED: 68 mg/dL
NON-HDL CHOLESTEROL: 83 mg/dL
TRIGLYCERIDES: 73 mg/dL

## 2024-03-28 LAB — TACROLIMUS LEVEL: TACROLIMUS BLOOD: 4.8 ug/L — ABNORMAL LOW

## 2024-03-28 NOTE — Unmapped (Addendum)
 Discussed recent labs with [facility], CPP.  Plan is to Make No Changes  with repeat labs in 1 Month.  Encouraged patient to hydrate and repeat labs in 18M. Patient follows locally with  Garnette Lukes, MD with [facility] for his diabetes management.   Secure MyChart message sent to the patient to relay this information.    Lab Results   Component Value Date    TACROLIMUS  4.8 (L) 03/22/2024    SIROLIMUS <1.0 (L) 06/10/2023     Goal: 5-8  Current Dose: Tacrolimus  1 mg/0.5 mg    Lab Results   Component Value Date    BUN 26 (H) 03/22/2024    CREATININE 1.54 (H) 03/22/2024    K 5.1 03/22/2024    GLU 130 (H) 03/22/2024    MG 1.8 03/22/2024     Lab Results   Component Value Date    WBC 9.7 03/22/2024    HGB 12.9 (L) 03/22/2024    HCT 39.7 03/22/2024    PLT 271 03/22/2024    NEUTROABS 7,391 03/22/2024    EOSABS 116 03/22/2024

## 2024-04-02 ENCOUNTER — Ambulatory Visit: Admitting: Podiatry

## 2024-04-02 VITALS — Ht 68.0 in | Wt 207.0 lb

## 2024-04-02 DIAGNOSIS — M85672 Other cyst of bone, left ankle and foot: Secondary | ICD-10-CM

## 2024-04-02 DIAGNOSIS — Q742 Other congenital malformations of lower limb(s), including pelvic girdle: Secondary | ICD-10-CM | POA: Diagnosis not present

## 2024-04-02 DIAGNOSIS — M79672 Pain in left foot: Secondary | ICD-10-CM

## 2024-04-02 DIAGNOSIS — M76822 Posterior tibial tendinitis, left leg: Secondary | ICD-10-CM | POA: Diagnosis not present

## 2024-04-02 NOTE — Progress Notes (Signed)
  Subjective:  Patient ID: [name], male    DOB: Dec 23, 1955,  MRN: [id number]  Chief Complaint  Patient presents with   Foot Pain    Rm 10 Patient is here to discuss MRI result for left foot.    Discussed the use of AI scribe software for clinical note transcription with the patient, who gave verbal consent to proceed.  History of Present Illness [name] is a 68 year old male with a history of heart surgery who presents with foot pain. He was referred by Dr. Verta for evaluation of foot pain.  He began experiencing foot pain on election day while walking, with the pain originating in the heel. He sought care at an emergency facility and was prescribed pain medication, but the pain subsided the following day, and he did not take the medication.  Approximately one month later, he experienced similar pain in the other foot while lying in bed, without any preceding fall or injury. He received an injection a few months ago, which provided relief for about a week. He was also prescribed pain medication, of which he has 17 pills remaining, indicating limited use.  Currently, he reports no pain, but occasionally experiences discomfort if pressure is applied to the foot.      Objective:    Physical Exam VASCULAR: DP and PT pulse palpable. Foot is warm and well-perfused. Capillary fill time is brisk. DERMATOLOGIC: Normal skin turgor, texture, and temperature. No open lesions, rashes, or ulcerations. NEUROLOGIC: Normal sensation to light touch and pressure. No paresthesias. ORTHOPEDIC: Smooth, pain-free range of motion of all examined joints. No ecchymosis or bruising. No gross deformity. No pain to palpation of navicular tuberosity, posterior tibial tendon, peroneal tendons, or lateral heel.   No images are attached to the encounter.    Results RADIOLOGY Foot CT: Accessory navicular and calcaneal bone cyst with well-corticated rim and intact talocalcaneal and  calcaneocuboid joints, subchondral bone (01/23/2024)   Assessment:   1. Posterior tibial tendonitis, left   2. 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 Other cyst of bone, left ankle and foot      Plan:  Patient was evaluated and treated and all questions answered.  Assessment and Plan Assessment & Plan Accessory navicular with tendonitis Intermittent pain in the area of the accessory navicular with associated tendonitis. CT scan reveals an accessory navicular bone and thickening of the tendon attaching to it. The condition is chronic and typically self-resolving. Currently, there is no significant pain or tenderness on palpation. - Monitor for recurrence of pain. - If pain recurs, consider treatment with a ankle brace and PT. - Consider surgical intervention if significant pain persists and is unresponsive to conservative measures.  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 It is asymptomatic and likely an incidental finding. - Monitor for any changes or development of symptoms.    He was also concerned about the x-ray report from October of last year that showed a metallic object I reviewed this with him and I recommended no further treatment this appears to be a vascular clip from previous vein graft harvest from cardiac surgery.  No follow-ups on file.

## 2024-04-05 DIAGNOSIS — Z941 Heart transplant status: Principal | ICD-10-CM

## 2024-04-05 MED ORDER — TACROLIMUS 0.5 MG CAPSULE, IMMEDIATE-RELEASE
ORAL_CAPSULE | ORAL | 11 refills | 30.00000 days | Status: CP
Start: 2024-04-05 — End: ?
  Filled 2024-04-11: qty 90, 30d supply, fill #0

## 2024-04-05 NOTE — Unmapped (Addendum)
 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      All above HIPAA information was verified with patient.     Was a Nurse, learning disability used for this call? No    Completed refill call assessment today to schedule patient's medication shipment from the [facility]  [phone]).  All relevant notes have been reviewed.     Specialty medication(s) and dose(s) confirmed: Regimen is correct and unchanged.   Changes to medications: Andrew Dickerson reports no changes at this time.  Changes to insurance: No  New side effects reported not previously addressed with a pharmacist or physician: None reported  Questions for the pharmacist: No    Confirmed patient received a Conservation officer, historic buildings and a Surveyor, mining with first shipment. The patient will receive a drug information handout for each medication shipped and additional FDA Medication Guides as required.       DISEASE/MEDICATION-SPECIFIC INFORMATION        N/A    SPECIALTY MEDICATION ADHERENCE     Medication Adherence    Patient reported X missed doses in the last month: 0  Specialty Medication: tacrolimus  0.5 MG capsule (PROGRAF )  Patient is on additional specialty medications: No  Patient is on more than two specialty medications: No  Any gaps in refill history greater than 2 weeks in the last 3 months: no  Demonstrates understanding of importance of adherence: yes  Informant: patient  Confirmed plan for next specialty medication refill: delivery by pharmacy  Refills needed for supportive medications: yes, ordered or provider notified          Refill Coordination    Has the Patients' Contact Information Changed: No  Is the Shipping Address Different: No         Were doses missed due to medication being on hold? No    tacrolimus  0.5 mg: 14 days of medicine on hand       REFERRAL TO PHARMACIST     Referral to the pharmacist: Not needed      [facility]     Shipping address confirmed in Epic.     Cost and Payment: Patient has a copay of $9.55. They are aware and have authorized the pharmacy to charge the credit card on file.    Delivery Scheduled: Yes, Expected medication delivery date: 04/12/24.  However, Rx request for refills was sent to the provider as there are none remaining.     Medication will be delivered via UPS to the prescription address in Epic WAM.    Andrew Dickerson   [facility]  Specialty Technician

## 2024-04-11 MED FILL — LEVOTHYROXINE 75 MCG TABLET: ORAL | 30 days supply | Qty: 30 | Fill #4

## 2024-04-11 MED FILL — DILTIAZEM CD 180 MG CAPSULE,EXTENDED RELEASE 24 HR: ORAL | 30 days supply | Qty: 60 | Fill #4

## 2024-04-24 ENCOUNTER — Other Ambulatory Visit: Payer: Self-pay | Admitting: Family Medicine

## 2024-04-24 DIAGNOSIS — R7309 Other abnormal glucose: Secondary | ICD-10-CM

## 2024-04-27 DIAGNOSIS — R7989 Other specified abnormal findings of blood chemistry: Secondary | ICD-10-CM | POA: Diagnosis not present

## 2024-04-27 DIAGNOSIS — Z941 Heart transplant status: Secondary | ICD-10-CM | POA: Diagnosis not present

## 2024-04-27 DIAGNOSIS — Z48298 Encounter for aftercare following other organ transplant: Secondary | ICD-10-CM | POA: Diagnosis not present

## 2024-04-27 DIAGNOSIS — Z79899 Other long term (current) drug therapy: Secondary | ICD-10-CM | POA: Diagnosis not present

## 2024-04-30 LAB — BASIC METABOLIC PANEL
BLOOD UREA NITROGEN: 30 mg/dL — ABNORMAL HIGH (ref 7–25)
BUN / CREAT RATIO: 19 (calc) (ref 6–22)
CALCIUM: 9.6 mg/dL (ref 8.6–10.3)
CHLORIDE: 102 mmol/L (ref 98–110)
CO2: 20 mmol/L (ref 20–32)
CREATININE: 1.61 mg/dL — ABNORMAL HIGH (ref 0.70–1.35)
EGFR CKD-EPI NON-AA MALE: 47 mL/min/1.73m2 — ABNORMAL LOW
GLUCOSE RANDOM: 136 mg/dL (ref 65–139)
POTASSIUM: 5 mmol/L (ref 3.5–5.3)
SODIUM: 134 mmol/L — ABNORMAL LOW (ref 135–146)

## 2024-04-30 LAB — CBC W/ DIFFERENTIAL
BASOPHILS ABSOLUTE COUNT: 71 {cells}/uL (ref 0–200)
BASOPHILS RELATIVE PERCENT: 0.7 %
EOSINOPHILS ABSOLUTE COUNT: 163 {cells}/uL (ref 15–500)
EOSINOPHILS RELATIVE PERCENT: 1.6 %
HEMATOCRIT: 39.5 % (ref 38.5–50.0)
HEMOGLOBIN: 12.7 g/dL — ABNORMAL LOW (ref 13.2–17.1)
LYMPHOCYTES ABSOLUTE COUNT: 1561 {cells}/uL (ref 850–3900)
LYMPHOCYTES RELATIVE PERCENT: 15.3 %
MEAN CORPUSCULAR HEMOGLOBIN CONC: 32.2 g/dL (ref 32.0–36.0)
MEAN CORPUSCULAR HEMOGLOBIN: 29.2 pg (ref 27.0–33.0)
MEAN CORPUSCULAR VOLUME: 90.8 fL (ref 80.0–100.0)
MEAN PLATELET VOLUME: 10.2 fL (ref 7.5–12.5)
MONOCYTES ABSOLUTE COUNT: 643 {cells}/uL (ref 200–950)
MONOCYTES RELATIVE PERCENT: 6.3 %
NEUTROPHILS ABSOLUTE COUNT: 7762 {cells}/uL (ref 1500–7800)
NEUTROPHILS RELATIVE PERCENT: 76.1 %
PLATELET COUNT: 309 Thousand/uL (ref 140–400)
RED BLOOD CELL COUNT: 4.35 Million/uL (ref 4.20–5.80)
RED CELL DISTRIBUTION WIDTH: 12.5 % (ref 11.0–15.0)
WHITE BLOOD CELL COUNT: 10.2 Thousand/uL (ref 3.8–10.8)

## 2024-04-30 LAB — TACROLIMUS LEVEL, TROUGH: TACROLIMUS, TROUGH: 6.4 ug/L (ref 5.0–20.0)

## 2024-04-30 LAB — MAGNESIUM: MAGNESIUM: 1.8 mg/dL (ref 1.5–2.5)

## 2024-05-01 NOTE — Unmapped (Signed)
 Discussed recent labs with Connee Ina, PharmD.  Plan is to Make No Changes  with repeat labs in TBD.  Will discuss elevated/rising Cr with Dr Tina at his upcoming appt and determine if Tac goal can be decreased, will make lab plan based on our discussion  Secure MyChart message sent to the patient to relay this information.    Lab Results   Component Value Date    TACROLIMUS  6.4 04/27/2024    SIROLIMUS <1.0 (L) 06/10/2023     Goal: Tac: 5-8  Current Dose: Tacrolimus  1 mg/0.5 mg    Lab Results   Component Value Date    BUN 30 (H) 04/27/2024    CREATININE 1.61 (H) 04/27/2024    K 5.0 04/27/2024    GLU 136 04/27/2024    MG 1.8 04/27/2024     Lab Results   Component Value Date    WBC 10.2 04/27/2024    HGB 12.7 (L) 04/27/2024    HCT 39.5 04/27/2024    PLT 309 04/27/2024    NEUTROABS 7,762 04/27/2024    EOSABS 163 04/27/2024

## 2024-05-10 NOTE — Unmapped (Signed)
 [facility] Visit         Primary Provider: Katrinka Garnette Eva, MD   [address] [place] [address]    Other providers:  Lamar Aho, MD, GI [facility]  [place]. Elisabeth, MD; Arlena Gal, [facility], [place] Emeline Dandy, MD, [place] general surgeRY  Morene Mace, DO, [facility] at [facility], [place], podiatry, [facility] health, [place]    Reason for Visit:  Andrew Dickerson is a 68 y.o. male who is being seen for continued post-transplant care at his annual heart transplant clinic follow up.     Assessment & Plan:  No medication changes.  Overall doing well. Health maintenance reviewed (cardiac testing; updating immunizations - e.g., RSV, flu, COVID); low dose CT chest CT during future LHC visit for pulmonary nodule surveillance.    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  On dual immunosuppression (Mycophenolate  500 bid and Tacrolimus  1 mg /0.5 mg with goal 5-8) with normal cardiac function as again seen on today's echo.  No significant DSA's detected after 2018 (previously occasional single low level DSA (DR 16). No CAV.  - Recent Tac levels below, at goal. (Last tac dose adjustment 01/2018).  No changes today (stable Cr).    Lab Results   Component Value Date    Tacrolimus , Trough 6.4 04/27/2024    Tacrolimus , Trough 6.9 09/27/2023    Tacrolimus , Trough 7.6 11/29/2014    Tacrolimus , Trough 9.2 07/24/2014    Tacrolimus , Timed 4.8 (L) 03/22/2024   - His next diagnostic testing will be a left heart catheterization in February 2026.  - Return to clinic 1 year, sooner PRN    # Hypertension.  Well controlled on his current 4 drug regimen (Amlodipine  10 qAM, Diltiazem  360 qPM, Valsartan  320 mg, Chlorthalidone  12.5 qAM).   Chlorthalidone  and Valsartan  stopped by PCP 05/2023. Valsartan  resumed 07/2023 visit. Chlortahlidone resumed but at 12.5 mg daily 11/2023 due to LE edema  Historically: Amlodipine  increased from 5 to 10 in 07/2018. Patient was transitioned from Lisinopril  to Valsartan  160 mg daily in 08/2019 due to elevated blood pressures, further increased to 320 mg daily 09/2019.   PCP transiently held losartan, chlorthalidone , metformin 05/2023-07/2023 because of early fall nausea, vomiting, unintentional weight loss.  - Home BPs normal.  Today BP: 115/52 .  Takes BP 1 hour after medications each AM  - No changes     # Hyperlipidemia. Tolerating statin therapy - Atorvastatin  80 mg. LDL at goal <70.  Previous LDL 68 (03/22/24) 63 (12/10/21) 68 (04/24/21), 61 (10/22/2020), 95-109 (in 2020-21), <70 in 2022-2023, 78 (09/15/22), 70 (02/25/23), 68 (03/2024).  Lab Results   Component Value Date    CHOL 121 03/22/2024    LDL 68 03/22/2024    HDL 38 (L) 03/22/2024    TRIG 73 03/22/2024    A1C 6.9 (H) 03/22/2024   - LDL at goal, at 70 with good A1c and no CAV.    - No changes     # Diabetes (type II, on oral therapy)  # Obesity   On Metformin 1000 mg BID since at least 2017, managed by PCP Dr Katrinka.  Historically:  PCP transiently held metformin 05/2023-07/2023 because of early fall nausea, vomiting, unintentional weight loss, resumed at 500 TID, then back to 1000 BID.    - HgbA1c 6.9% (03/22/24) (previously as high as 13.5% 12/2015; well-controlled since). Home BG reportedly 130-150   - As before, encouraged exercise  and weight loss (to help with abd hernia); encouraged weight of 220 or less    # Lung Nodule and Adrenal nodule (adrenal adenoma)  4 mm right middle lobe pulmonary nodule and 1.1 cm right adrenal nodule seen on abdominal CT 06/2022 (adrenal nodule consistent with benign adrenal adenoma first discovered 05/2022).  (Unable to do MRI w/ retained lead.)  Follow-up CT since:  - Chest CT  10/25/23 Emphysema with scattered pulmonary micronodules. Some of these nodules are previously visualized on the CT abdomen/pelvis and are unchanged.   - Plan for annual low dose CT in Feb/March 2026 (h/o 20 tobacco pack years)    # Stage 2-3 CKD.  Most recent Cr 1.61 (04/27/2024), range 1.3- 1.6 since 2021 (02/26/20 Cr peaked at 1.6, when Tac level 6), <1.3 before 2021.  We discussed at past visits adequate hydration to minimize CKD with many years of tacrolimus  therapy.     - Advise increased water intake and reduction of Coke Zero and sweet tea. (He reports 2-3 bottles of water per day)  - Continue to monitor    # Other stable medical problems:  - BPH.  S/p TURP 06/2023. On Silodosin daily. No longer on Flomax  BID.  Appreciate local urology care, [facility] Dr Elisabeth; has next appt 05/2024  - Abdominal/umbilical hernias.  He has had a chronic abdominal umbilical hernia but has a more prominent mid abdominal ventral hernia with palpable tenderness along the left side.  We discussed in 2022 that definitive therapy would be future surgery.  Was intermittently leaking at our 04/2022 visit (see photo, ~6x5 cm); no clear incarcerated hernia.  In 06/2022 had abdominal CT and saw Dr Iva; patient preferred conservative mgmt with further clinic follow up in 6 months (has not returned).  Stable per pt.  Encouraged pt to follow-up PRN   - Hypothyroidism. TSH - 2.0 (03/22/24) on supplementation Synthroid  75 mcg (TSH WNL since 2012) - no change.   - GERD. on Famotidine  BID (previously BID). Previously on Omeprazole . Only noted heartburn when supine in or on right side.  Continue Famotidine  (he desired no change in regimen in 2021 but this could be as needed).    - Magnesium  supplementation.  Mg levels WNL on Mg Oxide.  No changes  - Musculoskeletal pain (knee, lower back, BLE): Managed by PCP PRN and urgent care visits 06/2023 (RLE), 07/2023 (L foot).    # Health maintenance     -Dental: Has dentures  -Eyes: Is screened annually. Last visit 05/2024, no issues or vision changes (reportedly 20/25).  Does not use his prescribed glasses  >Cancer Screening  -PSA: 0.66 (09/15/22), checked by Urologist  -Dermatology: Dr. Shona - locally. No hx of skin cancers, does not recall his last visit, recommended follow up if due.  -Colonoscopy: Last 04/2017, follow up due 2028 (Dr. Debrah, [facility])  -Chest imaing for Pulmonary nodules - as above  >Endocrine  -TSH as above  -Vitamin D - 59 (03/22/24) 52 (09/15/22),(12/10/21) 46 (04/24/2021),on Vitamin D 3000 units daily - no change  -HgA1C  - as above.  >ID/Vaccincations:   - Flu Shot: 06/22/23, due Fall 2025  - Pneumovax:  Prevnar13: 07/18/12.  PPSV23: 02/23/2019, PCV 20: 09/15/22, Prevanr 21 Due/Recommended  - Tetanus: 05/12/22  - Shingrix: 09/15/22, 12/10/22  - RSV: due  - COVID 19: 11/04/19, 12/01/19, 10/22/20 06/18/22 - due in the fall. (Had Covid 04/2020 with nausea, diarrhea, fatigue; resolved without specific therapy)       Patient was also seen by  Laymon Christobal Perry, RN, transplant coordinator, who also served as a Neurosurgeon.    - Avelina Chihuahua, MD      History of Present Illness:  Andrew Dickerson is a 68 y.o. male with underwent LVAD, then a heart transplantation for ischemic cardiomyopathy on 07/18/2012. His post-transplant course has been notable for only for labile DM control (hospitalization locally for DKA 12/2015).  His cardiac status has been normal/stable, no CAV, occasional single DSA with MFI >1000, and he remains on chronic dual immunosuppression of  Tacrolimus  and Mycophenolate .  His cardiac transplant-related diagnostic testing is detailed below.  His last endomyocardial biopsy was 08/24/16 (ISHLT grade 1R/1A).     Summary of recent visits/events:  - 02/01/2017 'annual' post-transplant [facility] clinic for his clinic visit.  Overall well, no medication changes, with now impressive control of his diabetes (HgA1c 5.4), well-controlled BP on 4 antihypertensives, but he still could lose weight (lowest post-transplant weight was 218 lbs last year 02/2016, when HgA1c was 7.9). Thus, we encouraged more exercise for intentional weight loss (goal <220 lbs), and to maintain current good health maintenance.    - 01/17/2018 'annual' post-transplant [facility] clinic: doing overall well; medication changes that day: decrease Tacrolimus  back to 1 mg qAM, 0.5 mg qPM.  In 11/2017, he URI, saw PCP for cough, fever, given doxycycline x 1 week and Tessalon perles with resolution of symptoms.  Well since, trying to lose weight, watching his diet (records all his food intake), goes to gym to walk 4-5 miles, 3x week MWF (goal of ~6000 steps/day on average), DM well controlled on metformin.  - 02/21/19 Video visit as 'annual' post-transplant Cardiology clinic: Overall doing well (with interim med changes by our CPP: Omeprazole  changed to Famotidine  20 mg BID, and tamsulosin  discontinued 07/2018).  Med changes with 02/2019 visit: Double MgOxide 400 mg softgels to 800 mg/day.  Health maintenance reviewed - e.g., See PCP soon to update immunizations (PPSV23, Td, Shringrix)Repeat labs this month (usually [facility]), including repeat HgA1c to reassess home monitor accuracy and metformin, as tries to exercise more and lose weight   - 03/25/20 annual [facility] cardiology post-transplant clinic visit [facility]): Overall doing well. Restart Flomax  for urinary urgency which may also help BP and Cr.   In 09/2019, Lisinopril  transitioned to Valsartan  which was later increased to 320 mg/d; MgOxide increased to 800 mg BID.  -06/02/21 annual [facility] cardiology clinic visit [facility]):  No medication changes.  Health maintenance reviewed   -04/20/22 annual [facility] cardiology clinic visit [facility]):   Doing well. No medication changes.  Abdominal CT then surgical referral to repair umbilical hernia.  Health maintenance reviewed as below - cardiac testing (DSE in 6 months); updating immunizations - e.g., Shingrix, Tetanus, COVID booster, PCV20.   -04/26/23 annual [facility] cardiology clinic visit [facility]):  No medication changes.  Health maintenance reviewed (cardiac testing; local urology care; updating immunizations - e.g., RSV, fall boosters); local urology care for urinary retention.      Interval History:  Since his last visit 04/2023, had a prostatectomy/TURP (06/21/2023), and 2 ED/urgent care visits for leg pains (06/13/2023 for right leg pain/swelling, treated with steroids and pain meds; 07/13/2023 for left foot pain).  podiatry visits for posterior tibialis tendinitis in spring/summer 2025 treated with Kenalog injections  History of Present Illness  He has had frequent visits with his PCP adjusting his blood pressure and DM medications.  His PCP temporarily held his chlorthalidone , metformin, and valsartan  in September 2024, because of nausea, vomiting, and unintentional weight  loss for 2 months. These medications were resumed 07/2023, with metformin adjusted to 500 mg TID initially, then increased to 1000 mg BID.  Is otherwise doing well, recovered well after his TURP procedure and is currently on a BPH medication, although he does not recall the name. He was previously on Flomax  BID. His blood pressure is well-controlled, with recent readings around 115/52-135/66, on four antihypertensive medications.   He takes metformin 1000 mg BID and checks his blood sugar daily, which has been in the 130s to 150s in the morning.  He experiences occasional diarrhea and blurry vision. A recent eye exam showed 20/25 vision, and he has prescription glasses that he does not wear.    No other symptoms.  He reports no dyspnea at rest or with exertion; no orthopnea, PND, chest pain, palpitations, dizziness/lightheadnedness, presyncope, syncope.  No peripheral or abdominal swelling.  No nausea, vomiting,  constipation. No reflux symptoms.  No easy bruising or bleeding issues (no dark/tarry stools, epistaxis, BRBPR).  Walking well but not otherwise exercising. Wt ranges 210-221 lb.   His mom (91 yo) accompanies him today as usual.    Pertinent Post-operative Testing/History:  Post-Operative course:  His post-operative course required continued antibiotic therapy and Wound vac for his pre-existing LVAD driveline infection. Antihypertensive medications were slowly introduced post-HTx.    CMV D-/R-.  Toxo D-/R-.      Cardiac Diagnostic Testing:   07/18/13: LHC - no cardiac allograft vasculopathy (CAV)  08/06/14: LHC - no CAV   09/04/15: LHC - no CAV   08/23/16: LHC - no CAV    07/26/17: LHC - no CAV   07/19/18: DSE - normal  08/13/19: LHC - no CAV  10/22/20: DSE - Normal   12/14/21: LHC-  no CAV  09/15/22: DSE - normal  11/08/23: NM Spect- Normal ( cancelled DSE)?    Echo:  07/26/12: LVEF 60-65%  08/28/12: LVEF 60-65%  12/04/12: LVEF 60-65%  01/04/13: LVEF >55%  05/14/13: LVEF 60-65%  02/12/14: LVEF 60-65%  01/17/15: LVEF 65-70%  02/10/16: LVEF 60-65%  02/01/17: LVEF 65%  01/17/18: LVEF 60-65%  07/19/18: Normal LVEF on DSE  03/25/20: LVEF 65-70%  06/02/21: LVEF 60-65%  04/20/22: LVEF 60-65%  04/26/23: LVEF 65-70%  05/15/24: LVEF 65-70%    DSA:   08/01/12: No DSA (with MFI>1000)  08/18/12: No DSA  12/04/12: No DSA  04/09/13: No DSA  02/12/14: DR16, MFI 1499  01/07/15: DR16, MFI 1692  09/04/15: DR 16, MFI 3322  02/10/16: DR16, MFI 1644  02/01/17: DR16, MFI 1495  01/17/18: No DSA  08/13/19: No DSA  03/25/20: No DSA  10/22/20: No DSA  09/15/22: No DSA  10/25/23:No DSA    Rejection History:   08/02/2012: Mild acute cellular rejection, ISHLT 1R/2. His immunosupressive therapy was continued and Tacrolimus  has been titrated with daily levels for a current goal of 10-12.   09/11/12: showed mild rejection, ISHLT 1R/2 (One small foci of infiltrate, background is clean). His Prednisone was decreased to 12.5 mg daily. Subsequent biopsies have showed no rejection and so his immunosuppression was gradually weaned as per our practice protocol.  01/05/13: Baseline Allomap 34  02/03/13: Prednisone stopped  04/09/13: Allomap off Prednisone 33       Past Medical History:  Past Medical History:   Diagnosis Date    Coronary atherosclerosis of native coronary artery 04/21/2009    12/92: CABG x 5 with LIMA to LAD and saphenous grafts to diagonal 1, obtuse marginal, distal circ and distal PDA 8/00: Repeat  CABG x 3 with redo free left radial to distal LAD, saphenous to RCA, obtuse margina 2005: multiple stents placed to vein grafts and native arteries     DM (diabetes mellitus)    (CMS-HCC) 12/27/2012    Hypothyroidism 05/07/2011    LVAD (left ventricular assist device) present    (CMS-HCC) 12/27/2012    05/09/09 received heart mate II     Mixed hyperlipidemia 05/07/2011    Obesity 12/27/2012     Past Surgical History:   Procedure Laterality Date    HEART TRANSPLANT  07/18/12    CMV D-/R-    LEFT VENTRICULAR ASSIST DEVICE  05/09/09    PR BIOPSY OF HEART LINING N/A 09/04/2015    Procedure: Left/Right Heart Catheterization W Biopsy;  Surgeon: Norleen Deward Grumbling, MD;  Location: [facility] CATH;  Service: Cardiology    PR CATH PLACE/CORON ANGIO, IMG SUPER/INTERP,R&L HRT CATH, L HRT VENTRIC N/A 08/24/2016    Procedure: Left/Right Heart Catheterization W Intervention;  Surgeon: Norleen Deward Grumbling, MD;  Location: [facility] CATH;  Service: Cardiology    PR CATH PLACE/CORON ANGIO, IMG SUPER/INTERP,W LEFT HEART VENTRICULOGRAPHY N/A 07/26/2017    Procedure: Left Heart Catheterization;  Surgeon: Donnice Beverley Chester, MD;  Location: [facility] CATH;  Service: Cardiology    PR CATH PLACE/CORON ANGIO, IMG SUPER/INTERP,W LEFT HEART VENTRICULOGRAPHY N/A 08/13/2019    Procedure: Left Heart Catheterization;  Surgeon: Fairy Glean Ship, MD;  Location: [facility] CATH;  Service: Cardiology    PR CATH PLACE/CORON ANGIO, IMG SUPER/INTERP,W LEFT HEART VENTRICULOGRAPHY N/A 12/14/2021    Procedure: Left Heart Catheterization;  Surgeon: Reyes Jerilynn Sage, MD;  Location: [facility] CATH;  Service: Cardiology       Allergies: Patient has no known allergies.    Medications:   Current Outpatient Medications on File Prior to Visit   Medication Sig    amlodipine  (NORVASC ) 10 MG tablet Take 1 tablet (10 mg total) by mouth daily.    aspirin  (ADULT LOW DOSE ASPIRIN ) 81 MG tablet Take 1 tablet (81 mg total) by mouth daily.    atorvastatin  (LIPITOR) 80 MG tablet Take 1 tablet (80 mg total) by mouth daily.    chlorthalidone  (HYGROTON ) 25 MG tablet Take 0.5 tablets (12.5 mg total) by mouth every morning.    cholecalciferol, vitamin D3, 1,000 unit capsule Take 3 capsules (75 mcg total) by mouth daily.    dilTIAZem  (CARDIZEM  CD) 180 MG 24 hr capsule Take 2 capsules (360 mg total) by mouth daily.    ezetimibe  (ZETIA ) 10 mg tablet Take 1 tablet (10 mg total) by mouth daily.    famotidine  (PEPCID ) 20 MG tablet Take 1 tablet (20 mg total) by mouth Two (2) times a day.    levothyroxine  (SYNTHROID ) 75 MCG tablet Take 1 tablet (75 mcg total) by mouth daily.    magnesium  oxide (MAG-OX) 400 mg (241.3 mg elemental magnesium ) tablet Take 1 tablet (400 mg total) by mouth daily.    metFORMIN (GLUCOPHAGE) 1000 MG tablet Take 1 tablet (1,000 mg total) by mouth three (3) times a day. (Patient taking differently: Take 1 tablet (1,000 mg total) by mouth two (2) times a day.)    mycophenolate  (CELLCEPT ) 500 mg tablet Take 1 tablet (500 mg total) by mouth Two (2) times a day.    valsartan  (DIOVAN ) 320 MG tablet Take 1 tablet (320 mg total) by mouth daily.    tacrolimus  (PROGRAF ) 0.5 MG capsule Take 2 capsules (1 mg total) by mouth daily AND 1 capsule (0.5 mg total) nightly.  No current facility-administered medications on file prior to visit.   NOTES  - MgOxide is 400 (not 500) QD, and is OTC gel caps which he prefers to prescribed tablets     Family History: Grandfather with CAD, heart attack 'early in life', unsure of age, died from MI age 49s. He has one son, age 2, who is healthy. He has a Engineer, maintenance (IT), born 06/2014.    Social History:   He's still living at home with his mother (age 78+ years). 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 He  reports that he quit smoking about 33 years ago. His smoking use included cigarettes. He started smoking about 53 years ago. He has a 20 pack-year smoking history. He has never used smokeless tobacco. He reports that he does not drink alcohol and does not use drugs.  Previously rare social drinking for special holiday.  No tobacco, or illicit drug use.    Review of Systems:  Rest of the review of systems is negative or unremarkable except as stated above.    Physical Exam:  Vitals:    05/15/24 1014   BP: 115/52   BP Site: L Arm   BP Position: Sitting   BP Cuff Size: Medium   Pulse: 86   SpO2: 97%   Weight: (!) 101.6 kg (224 lb)   Height: 172.7 cm (5' 8)     Wt Readings from Last 12 Encounters:   05/15/24 (!) 101.6 kg (224 lb)   10/25/23 92.9 kg (204 lb 11.2 oz)   04/26/23 97.3 kg (214 lb 6.4 oz)   09/15/22 (!) 103.6 kg (228 lb 8 oz)   07/01/22 (!) 105.4 kg (232 lb 6.4 oz)   04/20/22 (!) 105.7 kg (233 lb)   12/14/21 (!) 105.9 kg (233 lb 6.4 oz)   06/02/21 (!) 108 kg (238 lb)   10/22/20 (!) 107.6 kg (237 lb 3.2 oz)   03/25/20 (!) 108.4 kg (239 lb)   08/13/19 (!) 107.6 kg (237 lb 4.8 oz)   01/17/18 (!) 105.1 kg (231 lb 9.6 oz)    Body mass index is 34.06 kg/m??.    Constitutional: Well groomed, NAD.   HENT: Normocephalic and atraumatic.  Wearing mask, oropharynx not specifically examined  Neck: Supple without enlargements, no thyromegaly, bruit or JVD. No cervical or supraclavicular lymphadenopathy.   Cardiovascular: Nondisplaced PMI, normal S1, S2, no M/G/R. Normal carotid pulses without bruits. Normal peripheral pulses.   Lungs: Chest rise symmetric.  Clear to auscultation bilaterally, without wheezes/crackles/rhonchi. Good air movement.   Skin: No rashes/breakdowns   Abdomen:  Soft, rotund, nontender, nondistended. +Large umbilical hernia noted, soft, nontender, reducible (in 2024, measuring approximately 6 cm x 5 cm).  No Hepatomegaly or other masses.   Extremities: Trace right pedal edema, no left pedal edema  Musculoskeletal: No obvious joint deformity, effusions.  Psychiatry: In good spirits, normal affect.  Neurological:   Nonfocal    Labs & Imaging:  Reviewed in EPIC.   Office Visit on 05/15/2024   Component Date Value Ref Range Status    EKG Ventricular Rate 05/15/2024 85  BPM Final    EKG Atrial Rate 05/15/2024 85  BPM Final    EKG P-R Interval 05/15/2024 142  ms Final    EKG QRS Duration 05/15/2024 82  ms Final    EKG Q-T Interval 05/15/2024 350  ms Final    EKG QTC Calculation 05/15/2024 416  ms  Final    EKG Calculated P Axis 05/15/2024 22  degrees Final    EKG Calculated R Axis 05/15/2024 9  degrees Final    EKG Calculated T Axis 05/15/2024 17  degrees Final    QTC Fredericia 05/15/2024 393  ms Final   EKG: tracing reviewed; see final report - NSR, normal intervals  Echo: images reviewed; see final report and above.

## 2024-05-11 ENCOUNTER — Other Ambulatory Visit: Payer: Self-pay | Admitting: Family Medicine

## 2024-05-14 DIAGNOSIS — H5213 Myopia, bilateral: Secondary | ICD-10-CM | POA: Diagnosis not present

## 2024-05-14 DIAGNOSIS — E7801 Familial hypercholesterolemia: Secondary | ICD-10-CM | POA: Diagnosis not present

## 2024-05-14 DIAGNOSIS — E119 Type 2 diabetes mellitus without complications: Secondary | ICD-10-CM | POA: Diagnosis not present

## 2024-05-14 DIAGNOSIS — I10 Essential (primary) hypertension: Secondary | ICD-10-CM | POA: Diagnosis not present

## 2024-05-15 ENCOUNTER — Ambulatory Visit
Admit: 2024-05-15 | Discharge: 2024-05-15 | Payer: Medicare (Managed Care) | Attending: Cardiovascular Disease | Primary: Cardiovascular Disease

## 2024-05-15 ENCOUNTER — Inpatient Hospital Stay: Admit: 2024-05-15 | Discharge: 2024-05-15 | Payer: Medicare (Managed Care)

## 2024-05-15 DIAGNOSIS — Z941 Heart transplant status: Principal | ICD-10-CM

## 2024-05-15 DIAGNOSIS — E119 Type 2 diabetes mellitus without complications: Secondary | ICD-10-CM | POA: Diagnosis not present

## 2024-05-15 DIAGNOSIS — Z8679 Personal history of other diseases of the circulatory system: Secondary | ICD-10-CM | POA: Diagnosis not present

## 2024-05-15 DIAGNOSIS — K429 Umbilical hernia without obstruction or gangrene: Secondary | ICD-10-CM | POA: Diagnosis not present

## 2024-05-15 DIAGNOSIS — E7800 Pure hypercholesterolemia, unspecified: Secondary | ICD-10-CM | POA: Diagnosis not present

## 2024-05-15 DIAGNOSIS — Z6834 Body mass index (BMI) 34.0-34.9, adult: Secondary | ICD-10-CM | POA: Diagnosis not present

## 2024-05-15 DIAGNOSIS — E278 Other specified disorders of adrenal gland: Secondary | ICD-10-CM | POA: Diagnosis not present

## 2024-05-15 DIAGNOSIS — E1100 Type 2 diabetes mellitus with hyperosmolarity without nonketotic hyperglycemic-hyperosmolar coma (NKHHC): Secondary | ICD-10-CM | POA: Diagnosis not present

## 2024-05-15 DIAGNOSIS — K439 Ventral hernia without obstruction or gangrene: Secondary | ICD-10-CM | POA: Diagnosis not present

## 2024-05-15 DIAGNOSIS — E669 Obesity, unspecified: Secondary | ICD-10-CM | POA: Diagnosis not present

## 2024-05-15 DIAGNOSIS — I10 Essential (primary) hypertension: Secondary | ICD-10-CM | POA: Diagnosis not present

## 2024-05-15 DIAGNOSIS — I071 Rheumatic tricuspid insufficiency: Secondary | ICD-10-CM | POA: Diagnosis not present

## 2024-05-15 DIAGNOSIS — N400 Enlarged prostate without lower urinary tract symptoms: Secondary | ICD-10-CM | POA: Diagnosis not present

## 2024-05-15 DIAGNOSIS — Z87891 Personal history of nicotine dependence: Secondary | ICD-10-CM | POA: Diagnosis not present

## 2024-05-15 DIAGNOSIS — Z79899 Other long term (current) drug therapy: Secondary | ICD-10-CM | POA: Diagnosis not present

## 2024-05-15 DIAGNOSIS — Z4821 Encounter for aftercare following heart transplant: Secondary | ICD-10-CM | POA: Diagnosis not present

## 2024-05-15 NOTE — Unmapped (Signed)
 [facility] Refill Coordination Note    Specialty Medication(s) to be Shipped:   Transplant: mycophenolate  mofetil 500mg  and sirolimus 0.5mg     Other medication(s) to be shipped: amlodipine , aspirin , atorvastatin , diltiazem , famotidine , levothyroxine     Specialty Medications not needed at this time: N/A     Andrew Dickerson, DOB: 1956-02-02  Phone: There are no phone numbers on file.      All above HIPAA information was verified with patient.     Was a Nurse, learning disability used for this call? No    Completed refill call assessment today to schedule patient's medication shipment from the [facility]  [phone]).  All relevant notes have been reviewed.     Specialty medication(s) and dose(s) confirmed: Regimen is correct and unchanged.   Changes to medications: Andrew Dickerson reports no changes at this time.  Changes to insurance: No  New side effects reported not previously addressed with a pharmacist or physician: None reported  Questions for the pharmacist: No    Confirmed patient received a Conservation officer, historic buildings and a Surveyor, mining with first shipment. The patient will receive a drug information handout for each medication shipped and additional FDA Medication Guides as required.       DISEASE/MEDICATION-SPECIFIC INFORMATION        N/A    SPECIALTY MEDICATION ADHERENCE     Medication Adherence    Patient reported X missed doses in the last month: 0  Specialty Medication: mycophenolate  500 mg tablet (CELLCEPT )  Patient is on additional specialty medications: Yes  Additional Specialty Medications: tacrolimus  0.5 MG capsule (PROGRAF )  Patient Reported Additional Medication X Missed Doses in the Last Month: 0  Patient is on more than two specialty medications: No  Any gaps in refill history greater than 2 weeks in the last 3 months: no  Demonstrates understanding of importance of adherence: yes  Informant: patient  Confirmed plan for next specialty medication refill: delivery by pharmacy  Refills needed for supportive medications: not needed          Refill Coordination    Has the Patients' Contact Information Changed: No  Is the Shipping Address Different: No         Were doses missed due to medication being on hold? No    mycophenolate  500 mg: 7 days of medicine on hand   tacrolimus  0.5  mg: 7 days of medicine on hand       REFERRAL TO PHARMACIST     Referral to the pharmacist: Not needed      [facility]     Shipping address confirmed in Epic.     Cost and Payment: Patient has a copay of $35.69. They are aware and have authorized the pharmacy to charge the credit card on file.    Delivery Scheduled: Yes, Expected medication delivery date: 05/18/24.     Medication will be delivered via UPS to the prescription address in Epic WAM.    [name]   [facility]  Specialty Technician

## 2024-05-15 NOTE — Unmapped (Addendum)
-   Your echo looks good    - Your kidney function has wobbled, stay hydrated ( drink more water) and lets repeat labs in about 1 month     - Dr Tina will see you back in a year     - We will plan for a LHC and a low dose CT scan in February/ March     - You are due for the flu, Covid and RSV vaccines    Summary from the AI:  VISIT SUMMARY:  You had your annual cardiovascular evaluation today. We discussed your current health status, including your heart transplant, kidney function, blood pressure, lung nodules, cholesterol, thyroid function, and vitamin D levels. Overall, your heart function is stable, and your other conditions are well-managed with your current medications.    YOUR PLAN:  -HEART TRANSPLANT STATUS, POST-TRANSPLANT SURVEILLANCE: Your heart function is stable with an ejection fraction of 65-70%, which is good. We will schedule a left heart catheterization in February 2026 and perform a low dose chest CT during that visit to monitor your lung nodules.    -CHRONIC KIDNEY DISEASE WITH FLUCTUATING CREATININE: Your kidney function has been fluctuating, with your creatinine level currently at 1.6. Chronic kidney disease means your kidneys are not working as well as they should. We recommend increasing your water intake and reducing diuretics like Coke Zero and sweet tea. We will recheck your creatinine levels after you make these hydration adjustments.    -HYPERTENSION, STABLE ON MULTIDRUG REGIMEN: Your blood pressure is well-controlled at 115/52 with your current medications. Hypertension means high blood pressure. Continue your current antihypertensive regimen and monitor your blood pressure at home, reporting any significant changes.    -PULMONARY NODULES, UNDER ANNUAL SURVEILLANCE: You have lung nodules that are being monitored annually. Pulmonary nodules are small growths in the lungs. We will perform a low dose chest CT in February 2026 to continue monitoring them.    -HYPERLIPIDEMIA, WELL CONTROLLED: Your cholesterol levels are well-controlled with an LDL of 68. Hyperlipidemia means high levels of fats in the blood. Continue taking atorvastatin  80 mg daily.    -HYPOTHYROIDISM, STABLE ON REPLACEMENT THERAPY: Your thyroid function is stable with a TSH of 2. Hypothyroidism means your thyroid gland is underactive. Continue taking Synthroid  75 mcg daily.    -VITAMIN D DEFICIENCY, ON SUPPLEMENTATION: Your vitamin D levels are well-managed with your current supplementation. Vitamin D deficiency means you have lower than normal levels of vitamin D. Continue taking vitamin D 3000 units daily.    INSTRUCTIONS:  Please schedule your left heart catheterization and low dose chest CT for February 2026. Increase your water intake and reduce diuretics like Coke Zero and sweet tea, then recheck your creatinine levels. Continue monitoring your blood pressure at home and report any significant changes.      Laymon Christobal Gasper OBIE, BSN, PCCN- Heart Transplant Coordinator  [facility]  [address]  [place], [place] [address]  p [phone]- f [phone]

## 2024-05-16 ENCOUNTER — Other Ambulatory Visit: Payer: Self-pay | Admitting: Medical Genetics

## 2024-05-16 DIAGNOSIS — Z941 Heart transplant status: Secondary | ICD-10-CM | POA: Diagnosis not present

## 2024-05-17 MED FILL — ASPIRIN 81 MG TABLET,DELAYED RELEASE: ORAL | 90 days supply | Qty: 90 | Fill #3

## 2024-05-17 MED FILL — FAMOTIDINE 20 MG TABLET: ORAL | 90 days supply | Qty: 180 | Fill #3

## 2024-05-17 MED FILL — AMLODIPINE 10 MG TABLET: ORAL | 90 days supply | Qty: 90 | Fill #3

## 2024-05-17 MED FILL — DILTIAZEM CD 180 MG CAPSULE,EXTENDED RELEASE 24 HR: ORAL | 30 days supply | Qty: 60 | Fill #5

## 2024-05-17 MED FILL — MYCOPHENOLATE MOFETIL 500 MG TABLET: ORAL | 90 days supply | Qty: 180 | Fill #3

## 2024-05-17 MED FILL — TACROLIMUS 0.5 MG CAPSULE, IMMEDIATE-RELEASE: ORAL | 30 days supply | Qty: 90 | Fill #1

## 2024-05-17 MED FILL — LEVOTHYROXINE 75 MCG TABLET: ORAL | 30 days supply | Qty: 30 | Fill #5

## 2024-05-17 MED FILL — ATORVASTATIN 80 MG TABLET: ORAL | 90 days supply | Qty: 90 | Fill #1

## 2024-05-18 ENCOUNTER — Encounter: Admitting: Family Medicine

## 2024-05-28 ENCOUNTER — Encounter: Payer: Self-pay | Admitting: Family Medicine

## 2024-05-28 ENCOUNTER — Ambulatory Visit (INDEPENDENT_AMBULATORY_CARE_PROVIDER_SITE_OTHER): Admitting: Family Medicine

## 2024-05-28 VITALS — BP 130/40 | HR 68 | Temp 98.2°F | Ht 68.0 in | Wt 225.0 lb

## 2024-05-28 DIAGNOSIS — Z7984 Long term (current) use of oral hypoglycemic drugs: Secondary | ICD-10-CM | POA: Diagnosis not present

## 2024-05-28 DIAGNOSIS — Z125 Encounter for screening for malignant neoplasm of prostate: Secondary | ICD-10-CM | POA: Diagnosis not present

## 2024-05-28 DIAGNOSIS — R1310 Dysphagia, unspecified: Secondary | ICD-10-CM | POA: Diagnosis not present

## 2024-05-28 DIAGNOSIS — Z0000 Encounter for general adult medical examination without abnormal findings: Secondary | ICD-10-CM

## 2024-05-28 DIAGNOSIS — E119 Type 2 diabetes mellitus without complications: Secondary | ICD-10-CM | POA: Diagnosis not present

## 2024-05-28 DIAGNOSIS — Z23 Encounter for immunization: Secondary | ICD-10-CM

## 2024-05-28 LAB — POCT GLYCOSYLATED HEMOGLOBIN (HGB A1C): Hemoglobin A1C: 6.6 % — AB (ref 4.0–5.6)

## 2024-05-28 NOTE — Progress Notes (Signed)
 Phone: [phone]   Subjective:  Patient presents today for their annual physical. Chief complaint-noted.   See problem oriented charting- ROS- full  review of systems was completed and negative  except for topics noted under acute/chronic concerns  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/2009   on old heart   Blood transfusion without reported diagnosis    CHF (congestive heart failure) (HCC)    Chronic kidney disease    CORONARY ARTERY DISEASE 04/25/2007   DIABETES MELLITUS, TYPE II 12/13/2008   no meds, diet contolled   GERD (gastroesophageal reflux disease)    HYPERLIPIDEMIA 04/25/2007   Hypertension    Hypothyroidism    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   MYOCARDIAL INFARCTION, 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   OBESITY 02/26/2009   THYROID  DISEASE, HX OF 05/14/2008   VENTRICULAR TACHYCARDIA 02/26/2009   Patient Active Problem List   Diagnosis Date Noted   Immunosuppressed status (HCC) 02/23/2019    Priority: High   Heart transplanted (HCC) 06/19/2014    Priority: High   Chronic systolic heart failure (HCC) 03/02/2009    Priority: High   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    Priority: High   AUTOMATIC IMPLANTABLE CARDIAC DEFIBRILLATOR SITU 02/26/2009    Priority: High   Diabetes mellitus type II, controlled (HCC) 12/13/2008    Priority: High   Coronary atherosclerosis 04/25/2007    Priority: High   Chronic abdominal pain 12/27/2015    Priority: Medium    Benign prostatic hyperplasia 02/26/2014    Priority: Medium    Hypothyroidism 05/14/2008    Priority: Medium    Essential hypertension 05/14/2008    Priority: Medium    ANEMIA, OTHER UNSPEC 04/03/2008    Priority: Medium    Hyperlipidemia associated with type 2 diabetes  mellitus (HCC) 04/25/2007    Priority: Medium    GERD (gastroesophageal reflux disease) 06/19/2014    Priority: Low   Osteoarthritis 06/19/2014    Priority: Low   History of adenomatous polyp of colon 09/17/2013    Priority: Low   Pain in left ankle and joints of left foot 07/25/2023   Pain in left foot 07/25/2023   BPH with obstruction/lower urinary tract symptoms 06/21/2023   Nausea and vomiting 05/18/2023   Past Surgical History:  Procedure Laterality Date   ANGIOPLASTY     with stent   CARDIAC ASSIST DEVICE REMOVAL     CARDIAC CATHETERIZATION     COLONOSCOPY     CORONARY ARTERY BYPASS GRAFT     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   LEFT VENTRICULAR ASSIST DEVICE  05/2009   [facility]   TRANSURETHRAL RESECTION OF PROSTATE N/A 06/21/2023   Procedure: TRANSURETHRAL RESECTION OF THE PROSTATE (TURP);  Surgeon: Elisabeth Valli BIRCH, MD;  Location: [facility];  Service: Urology;  Laterality: N/A;  60 MINUTES    Family History  Problem Relation Age of Onset   Heart disease Mother    Lung cancer Maternal Grandmother    Lung cancer Maternal Grandfather    Heart disease Maternal Grandfather    Alcohol abuse Maternal Uncle    Colon cancer Neg Hx    Esophageal cancer Neg Hx    Prostate cancer Neg Hx    Rectal cancer Neg Hx    Stomach cancer Neg Hx     Medications- reviewed and  updated Current Outpatient Medications  Medication Sig Dispense Refill   silodosin (RAPAFLO) 8 MG CAPS capsule Take 8 mg by mouth daily.     valsartan  (DIOVAN ) 160 MG tablet Take 320 mg by mouth daily.     ACCU-CHEK GUIDE TEST test strip USE  STRIP 4 TIMES DAILY 100 each 0   amLODipine  (NORVASC ) 10 MG tablet Take 10 mg by mouth in the morning.     aspirin  EC 81 MG tablet Take 1 tablet (81 mg total) by mouth daily. 30 tablet 0   atorvastatin  (LIPITOR ) 80 MG tablet Take 1 tablet (80 mg total) by mouth daily at 6 PM. 30 tablet 0   blood glucose meter kit and supplies KIT Dispense based on patient and insurance preference. Use  up to four times daily as directed. 1 each 0   chlorthalidone  (HYGROTON ) 25 MG tablet Take 0.5 tablets (12.5 mg total) by mouth daily. 45 tablet 3   cholecalciferol (VITAMIN D ) 1000 units tablet Take 3 tablets (3,000 Units total) by mouth daily. 90 tablet 0   diltiazem  (TIAZAC ) 180 MG 24 hr capsule Take 2 capsules (360 mg total) by mouth daily. (Patient taking differently: Take 360 mg by mouth every evening.) 60 capsule 0   ezetimibe  (ZETIA ) 10 MG tablet Take 1 tablet by mouth once daily 90 tablet 0   famotidine  (PEPCID ) 20 MG tablet Take 20 mg by mouth in the morning and at bedtime.     levothyroxine  (SYNTHROID , LEVOTHROID) 75 MCG tablet Take 1 tablet (75 mcg total) by mouth daily before breakfast. 30 tablet 0   magnesium  oxide (MAG-OX) 400 (240 Mg) MG tablet Take 400 mg by mouth in the morning.     magnesium  oxide (MAG-OX) 400 MG tablet Take 1.5 tablets (600 mg total) by mouth 2 (two) times daily. (Patient taking differently: Take 400 mg by mouth daily.) 45 tablet 0   metFORMIN  (GLUCOPHAGE ) 1000 MG tablet TAKE 1 TABLET BY MOUTH TWICE DAILY WITH MEALS 180 tablet 0   mycophenolate  (CELLCEPT ) 500 MG tablet Take 1 tablet (500 mg total) by mouth 2 (two) times daily. 60 tablet 0   tacrolimus  (PROGRAF ) 0.5 MG capsule Take 2 capsules (1 mg total) by mouth 2 (two) times daily. (Patient taking differently: Take 0.5-1 mg by mouth See admin instructions. Take 2 capsules (1 mg) by mouth in the morning & take 1 capsule (0.5 mg) by mouth in the evening.) 120 capsule 0   No current facility-administered medications for this visit.    Allergies-reviewed and updated No Known Allergies  Social History   Social History Narrative   Single. Divorced. 1 son. 1 granddaughter 2014.       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       Hobbies: time on CPU            Objective  Objective:  BP (!) 130/40   Pulse 68   Temp 98.2 F (36.8 C)   Ht 5' 8 (1.727 m)   Wt 225 lb (102.1 kg)    SpO2 98%   BMI 34.21 kg/m  Gen: NAD, resting comfortably HEENT: Mucous membranes are moist. Oropharynx normal Neck: no thyromegaly CV: RRR no murmurs rubs or gallops Lungs: CTAB no crackles, wheeze, rhonchi Abdomen: soft/nontender/nondistended/normal bowel sounds. No rebound or guarding. Umbilical hernia noted- reports there is no pain Ext: trace edema Skin: warm, dry Neuro: grossly normal, moves all extremities, PERRLA  Diabetic foot exam was performed with the following findings:  No deformities, ulcerations, or other skin breakdown Normal sensation of 10g monofilament Intact posterior tibialis and dorsalis pedis pulses       Assessment and Plan  68 y.o. male presenting for annual physical.  Health Maintenance counseling: 1. Anticipatory guidance: Patient counseled regarding regular dental exams - dentures, eye exams - yearly,  avoiding smoking and second hand smoke, limiting alcohol to 2 beverages per day - very rare social, no illicit drugs .   2. Risk factor reduction:  Advised patient of need for regular exercise and diet rich and fruits and vegetables to reduce risk of heart attack and stroke.  Exercise- feels could improve- encouraged to restart- about a year off.  Diet/weight management-weight trending up- advised to reverse this- up 3 lbs from last year but 18 from last visit without edema- does not appear to be CHF.  Wt Readings from Last 3 Encounters:  05/28/24 225 lb (102.1 kg)  04/02/24 207 lb (93.9 kg)  11/21/23 207 lb (93.9 kg)   3. Immunizations/screenings/ancillary studies- flu today , COVID in 2 weeks Immunization History  Administered Date(s) Administered   Fluad Quad(high Dose 65+) 06/30/2022   Fluad Trivalent(High Dose 65+) 06/22/2023   HIB (PRP-OMP) 05/02/2009   Hepatitis A, Adult 06/09/2012   Hepatitis B 09/14/2011   Hepatitis B, ADULT 05/08/2009, 09/14/2011, 06/09/2012   Influenza Split 07/02/2011   Influenza Whole 06/25/2008   Influenza,  Seasonal, Injecte, Preservative Fre 08/06/2010, 06/09/2012   Influenza,inj,Quad PF,6+ Mos 08/07/2013, 06/19/2014, 08/29/2015, 08/20/2016, 06/07/2018, 05/22/2019, 06/16/2020, 06/02/2021   Influenza-Unspecified 06/19/2014, 06/16/2020, 06/30/2022   PFIZER(Purple Top)SARS-COV-2 Vaccination 11/04/2019, 12/01/2019, 10/22/2020   PNEUMOCOCCAL CONJUGATE-20 09/15/2022   PPD Test 05/04/2009, 09/14/2011, 06/09/2012   Pfizer Covid-19 Vaccine Bivalent Booster 46yrs & up 06/16/2022   Pneumococcal Conjugate-13 07/18/2012   Pneumococcal Polysaccharide-23 09/06/2009, 06/09/2012, 02/23/2019   Pneumococcal-Unspecified 05/02/2009   Td 05/02/2009   Tdap 09/14/2011, 05/12/2022   Zoster Recombinant(Shingrix) 09/15/2022, 12/10/2022   Zoster, Live 12/16/2006  4. Prostate cancer screening- PSA low risk prior trend- previously- when he comes back for kidney test recheck PSA   Lab Results  Component Value Date   PSA 0.66 09/15/2022   PSA 0.70 10/22/2020   PSA 0.5 01/27/2017   5. Colon cancer screening - 04/25/2017 with 10 year repeat 6. Skin cancer screening- saw them this year. advised regular sunscreen use. Denies worrisome, changing, or new skin lesions.  7. Smoking associated screening (lung cancer screening, AAA screen 65-75, UA)- former smoker- quit smoking long time ago. Abdominal aortic aneurysm negative 11/27/2015 8. STD screening - not dating, no short term disability screening needed  Status of chronic or acute concerns   #Trouble swallowing- if drinks something cold - feels like food getting stuck. Had heavy hiccups and had to vomit up a small amount then even when went home had clear thick sputum had to spit up - refer to gastroenterology   # Posterior tibial tendinitis-working with podiatry and improved  #Heart transplant/immunosuppressed-patient follows up closely with [facility] S: Medication: Remains on mycophenolate  500 mg twice daily  and tacrolimus  0.5 mg twice daily   A/P: overall stable- continue  close follow up with [facility]   #CAD  #hyperlipidemia S: Medication:Atorvastatin  80 mg, Zetia  10 mg, aspirin  81 mg  -no chest pain or shortness of breath  A/P: CAD-asymptomatic continue current medications  Hyperlipidemia-LDL under 70 recently- continue current medications    # Diabetes S: Medication:metformin  500 mg twice daily --> 1000mg  bid A/P: diabetes well controlled but may need to watch this if  any further worsening kidney function   #CHF  #hypertension S: medication: Valsartan  320 mg, amlodipine  10 mg, diltiazem  360 mg extended release, chlorthalidone  12.5 mg-prior dehydration concerns but may have been another medication  -Lasix  usage: not needing  Home readings #s: variable but some diastolic readings down in 50's and 60's with most systolic controlled A/P: CHF appears euvolemic- weight is up but swelling is not- continue to monitor. Encouraged weight loss - hypertension controlled but having increased creatinine about 0.5 at [facility] after starting chlorthalidone  back last visit. Chlorthalidone  may be creating strain on kidneys- lets stop it for 3 weeks and come back for repeat kidney function test   #hypothyroidism S: compliant On thyroid  medication-levothyroxine  75 mcg A/P: TSH 2.0 03/22/24 well controlled continue current medications    #BPH- TURP procedure October 2024 with Dr. Elisabeth RAMAN: Medication: Rapaflo 8 mg - Tamsulosin  0.4 mg daily- has side effects and stopped A/P: reasonable control- continue current medications     #GERD-Pepcid  controls for the most part  Recommended follow up: Return in about 4 months (around 09/27/2024) for followup or sooner if needed.Schedule b4 you leave.  Lab/Order associations: already had labs   ICD-10-CM   1. Preventative health care  Z00.00 POCT glycosylated hemoglobin (Hb A1C)    2. Controlled type 2 diabetes mellitus without complication, without long-term current use of insulin  (HCC)  E11.9 POCT glycosylated hemoglobin (Hb A1C)    Basic  metabolic panel with GFR    Microalbumin / creatinine urine ratio    Urinalysis, Routine w reflex microscopic    3. Needs flu shot  Z23 Flu vaccine HIGH DOSE PF(Fluzone Trivalent)    4. Dysphagia, unspecified type  R13.10 Ambulatory referral to Gastroenterology    5. Screening for prostate cancer  Z12.5 PSA, Medicare ( [place] [facility] only)      No orders of the defined types were placed in this encounter.   Return precautions advised.  Garnette Lukes, MD

## 2024-05-28 NOTE — Patient Instructions (Addendum)
 [facility] contact for Dr. Legrand Please call to schedule visit and/or procedure IF you do not hear within a week Address: [address] [place], [place], [place] [address] Phone: [phone]   Chlorthalidone  may be creating strain on kidneys- lets stop it for 3 weeks and come back for repeat kidney function test- need lab visit at that time  Flu shot today. Agree with COVID shot at pharmacy maybe 2-3 weeks later  Team please abstract from 03/22/24 LDL 68 on July 25 A1c 6.97  TSH 2.0   Recommended follow up: Return in about 4 months (around 09/27/2024) for followup or sooner if needed.Schedule b4 you leave.

## 2024-06-08 NOTE — Unmapped (Signed)
 Called pt to have labs drawn next week per TNC.

## 2024-06-11 NOTE — Unmapped (Signed)
 [facility] Specialty and Home Delivery Pharmacy Refill Coordination Note    Specialty Medication(s) to be Shipped:   Transplant: tacrolimus  0.5mg     Other medication(s) to be shipped: levothyroxine  and diltiazem     Specialty Medications not needed at this time: N/A     Andrew Dickerson, DOB: 04/15/1956  Phone: There are no phone numbers on file.      All above HIPAA information was verified with patient.     Was a Nurse, learning disability used for this call? No    Completed refill call assessment today to schedule patient's medication shipment from the [facility] and Home Delivery Pharmacy  [phone]).  All relevant notes have been reviewed.     Specialty medication(s) and dose(s) confirmed: Regimen is correct and unchanged.   Changes to medications: Andrew Dickerson reports no changes at this time.  Changes to insurance: No  New side effects reported not previously addressed with a pharmacist or physician: None reported  Questions for the pharmacist: No    Confirmed patient received a Conservation officer, historic buildings and a Surveyor, mining with first shipment. The patient will receive a drug information handout for each medication shipped and additional FDA Medication Guides as required.       DISEASE/MEDICATION-SPECIFIC INFORMATION        N/A    SPECIALTY MEDICATION ADHERENCE     Medication Adherence    Patient reported X missed doses in the last month: 0  Specialty Medication: tacrolimus  0.5 MG capsule (PROGRAF )  Patient is on additional specialty medications: No  Patient is on more than two specialty medications: No  Any gaps in refill history greater than 2 weeks in the last 3 months: no  Demonstrates understanding of importance of adherence: yes  Informant: patient  Confirmed plan for next specialty medication refill: delivery by pharmacy  Refills needed for supportive medications: not needed          Refill Coordination    Has the Patients' Contact Information Changed: No  Is the Shipping Address Different: No         Were doses missed due to medication being on hold? No    tacrolimus  0.5   mg: 10 days of medicine on hand       REFERRAL TO PHARMACIST     Referral to the pharmacist: Not needed      [facility]     Shipping address confirmed in Epic.     Cost and Payment: Patient has a copay of $9.55. They are aware and have authorized the pharmacy to charge the credit card on file.    Delivery Scheduled: Yes, Expected medication delivery date: 06/15/24.     Medication will be delivered via UPS to the prescription address in Epic WAM.    Andrew Dickerson   [facility] Specialty and Home Delivery Pharmacy  Specialty Technician

## 2024-06-14 MED FILL — DILTIAZEM CD 180 MG CAPSULE,EXTENDED RELEASE 24 HR: ORAL | 30 days supply | Qty: 60 | Fill #6

## 2024-06-14 MED FILL — TACROLIMUS 0.5 MG CAPSULE, IMMEDIATE-RELEASE: ORAL | 30 days supply | Qty: 90 | Fill #2

## 2024-06-14 MED FILL — LEVOTHYROXINE 75 MCG TABLET: ORAL | 30 days supply | Qty: 30 | Fill #6

## 2024-06-18 ENCOUNTER — Other Ambulatory Visit: Payer: Self-pay | Admitting: Family Medicine

## 2024-06-18 ENCOUNTER — Other Ambulatory Visit

## 2024-06-18 ENCOUNTER — Ambulatory Visit: Payer: Self-pay | Admitting: Family Medicine

## 2024-06-18 DIAGNOSIS — Z125 Encounter for screening for malignant neoplasm of prostate: Secondary | ICD-10-CM

## 2024-06-18 DIAGNOSIS — Z941 Heart transplant status: Secondary | ICD-10-CM

## 2024-06-18 DIAGNOSIS — E119 Type 2 diabetes mellitus without complications: Secondary | ICD-10-CM | POA: Diagnosis not present

## 2024-06-18 LAB — BASIC METABOLIC PANEL WITH GFR
BUN: 23 mg/dL (ref 6–23)
CO2: 21 meq/L (ref 19–32)
Calcium: 9.9 mg/dL (ref 8.4–10.5)
Chloride: 105 meq/L (ref 96–112)
Creatinine, Ser: 1.43 mg/dL (ref 0.40–1.50)
GFR: 50.7 mL/min — ABNORMAL LOW (ref >60.00)
Glucose, Bld: 169 mg/dL — ABNORMAL HIGH (ref 70–99)
Potassium: 4.8 meq/L (ref 3.5–5.1)
Sodium: 137 meq/L (ref 135–145)

## 2024-06-18 LAB — CBC WITH DIFFERENTIAL/PLATELET
Basophils Absolute: 0.1 K/uL (ref 0.0–0.1)
Basophils Relative: 0.8 % (ref 0.0–3.0)
Eosinophils Absolute: 0.2 K/uL (ref 0.0–0.7)
Eosinophils Relative: 2.1 % (ref 0.0–5.0)
HCT: 42.2 % (ref 39.0–52.0)
Hemoglobin: 13.7 g/dL (ref 13.0–17.0)
Lymphocytes Relative: 15.9 % (ref 12.0–46.0)
Lymphs Abs: 1.4 K/uL (ref 0.7–4.0)
MCHC: 32.5 g/dL (ref 30.0–36.0)
MCV: 89.3 fl (ref 78.0–100.0)
Monocytes Absolute: 0.6 K/uL (ref 0.1–1.0)
Monocytes Relative: 7.1 % (ref 3.0–12.0)
Neutro Abs: 6.4 K/uL (ref 1.4–7.7)
Neutrophils Relative %: 74.1 % (ref 43.0–77.0)
Platelets: 273 K/uL (ref 150.0–400.0)
RBC: 4.73 Mil/uL (ref 4.22–5.81)
RDW: 14.2 % (ref 11.5–15.5)
WBC: 8.7 K/uL (ref 4.0–10.5)

## 2024-06-18 LAB — PHOSPHORUS: Phosphorus: 3.4 mg/dL (ref 2.3–4.6)

## 2024-06-18 LAB — PSA, MEDICARE: PSA: 1.01 ng/mL (ref 0.10–4.00)

## 2024-06-18 LAB — MAGNESIUM: Magnesium: 1.6 mg/dL (ref 1.5–2.5)

## 2024-06-19 ENCOUNTER — Other Ambulatory Visit

## 2024-06-19 DIAGNOSIS — Z941 Heart transplant status: Secondary | ICD-10-CM | POA: Diagnosis not present

## 2024-06-19 DIAGNOSIS — Z48298 Encounter for aftercare following other organ transplant: Secondary | ICD-10-CM | POA: Diagnosis not present

## 2024-06-19 DIAGNOSIS — R7989 Other specified abnormal findings of blood chemistry: Secondary | ICD-10-CM | POA: Diagnosis not present

## 2024-06-19 LAB — URINALYSIS, ROUTINE W REFLEX MICROSCOPIC
Bilirubin Urine: NEGATIVE
Hgb urine dipstick: NEGATIVE
Ketones, ur: NEGATIVE
Leukocytes,Ua: NEGATIVE
Nitrite: NEGATIVE
RBC / HPF: NONE SEEN (ref 0–?)
Specific Gravity, Urine: 1.015 (ref 1.000–1.030)
Total Protein, Urine: NEGATIVE
Urine Glucose: 250 — AB
Urobilinogen, UA: 0.2 (ref 0.0–1.0)
WBC, UA: NONE SEEN (ref 0–?)
pH: 5.5 (ref 5.0–8.0)

## 2024-06-19 LAB — MICROALBUMIN / CREATININE URINE RATIO
Creatinine,U: 54.3 mg/dL
Microalb Creat Ratio: 37 mg/g — ABNORMAL HIGH (ref 0.0–30.0)
Microalb, Ur: 2 mg/dL — ABNORMAL HIGH (ref 0.0–1.9)

## 2024-06-19 LAB — LAB REPORT - SCANNED: EGFR: 51

## 2024-06-21 NOTE — Unmapped (Signed)
 I spoke with the patient (or family member) to notify them that their insurance will no longer be in-network as of 09/06/2024. I informed them of their option to switch to Original Medicare or a different Medicare Advantage plan that includes [facility] during the annual enrollment period. I provided them with contact information for Chapter who can assist them with finding another plan that keeps [facility] in network.     I also informed them that if they continue to receive post-transplant care under their existing plan after 09/06/2024 or another out of network plan, they will be required to pay out of pocket for any services and submit claims on their own behalf to their insurance company.

## 2024-06-22 LAB — CBC W/ DIFFERENTIAL
BASOPHILS ABSOLUTE COUNT: 52 {cells}/uL (ref 0–200)
BASOPHILS RELATIVE PERCENT: 0.6 %
EOSINOPHILS ABSOLUTE COUNT: 148 {cells}/uL (ref 15–500)
EOSINOPHILS RELATIVE PERCENT: 1.7 %
HEMATOCRIT: 40.8 % (ref 38.5–50.0)
HEMOGLOBIN: 13.2 g/dL (ref 13.2–17.1)
LYMPHOCYTES ABSOLUTE COUNT: 1383 {cells}/uL (ref 850–3900)
LYMPHOCYTES RELATIVE PERCENT: 15.9 %
MEAN CORPUSCULAR HEMOGLOBIN CONC: 32.4 g/dL (ref 32.0–36.0)
MEAN CORPUSCULAR HEMOGLOBIN: 29.3 pg (ref 27.0–33.0)
MEAN CORPUSCULAR VOLUME: 90.5 fL (ref 80.0–100.0)
MEAN PLATELET VOLUME: 10.1 fL (ref 7.5–12.5)
MONOCYTES ABSOLUTE COUNT: 661 {cells}/uL (ref 200–950)
MONOCYTES RELATIVE PERCENT: 7.6 %
NEUTROPHILS ABSOLUTE COUNT: 6455 {cells}/uL (ref 1500–7800)
NEUTROPHILS RELATIVE PERCENT: 74.2 %
PLATELET COUNT: 274 Thousand/uL (ref 140–400)
RED BLOOD CELL COUNT: 4.51 Million/uL (ref 4.20–5.80)
RED CELL DISTRIBUTION WIDTH: 12.5 % (ref 11.0–15.0)
WHITE BLOOD CELL COUNT: 8.7 Thousand/uL (ref 3.8–10.8)

## 2024-06-22 LAB — PHOSPHORUS: PHOSPHORUS: 2.7 mg/dL (ref 2.1–4.3)

## 2024-06-22 LAB — BASIC METABOLIC PANEL
BLOOD UREA NITROGEN: 23 mg/dL (ref 7–25)
BUN / CREAT RATIO: 15 (calc) (ref 6–22)
CALCIUM: 9.7 mg/dL (ref 8.6–10.3)
CHLORIDE: 104 mmol/L (ref 98–110)
CO2: 22 mmol/L (ref 20–32)
CREATININE: 1.5 mg/dL — ABNORMAL HIGH (ref 0.70–1.35)
EGFR CKD-EPI NON-AA MALE: 51 mL/min/1.73m2 — ABNORMAL LOW
GLUCOSE RANDOM: 162 mg/dL — ABNORMAL HIGH (ref 65–99)
POTASSIUM: 4.9 mmol/L (ref 3.5–5.3)
SODIUM: 136 mmol/L (ref 135–146)

## 2024-06-22 LAB — TACROLIMUS LEVEL, TROUGH: TACROLIMUS, TROUGH: 6.3 ug/L (ref 5.0–20.0)

## 2024-06-22 LAB — MAGNESIUM: MAGNESIUM: 2 mg/dL (ref 1.5–2.5)

## 2024-06-22 NOTE — Unmapped (Signed)
 Discussed recent labs with [facility], [facility].  Plan is to Make No Changes  with repeat labs in 3 Months.    Secure MyChart message sent to the patient to relay this information.    Lab Results   Component Value Date    TACROLIMUS  6.3 06/19/2024    SIROLIMUS <1.0 (L) 06/10/2023     Goal: Tac: 5-8  Current Dose: Tacrolimus  1 mg/0.5 mg    Lab Results   Component Value Date    BUN 23 06/19/2024    CREATININE 1.50 (H) 06/19/2024    K 4.9 06/19/2024    GLU 162 (H) 06/19/2024    MG 2.0 06/19/2024     Lab Results   Component Value Date    WBC 8.7 06/19/2024    HGB 13.2 06/19/2024    HCT 40.8 06/19/2024    PLT 274 06/19/2024    NEUTROABS 6,455 06/19/2024    EOSABS 148 06/19/2024

## 2024-06-27 ENCOUNTER — Other Ambulatory Visit: Payer: Self-pay | Admitting: Family Medicine

## 2024-06-27 ENCOUNTER — Encounter: Payer: Self-pay | Admitting: Gastroenterology

## 2024-07-05 NOTE — Progress Notes (Addendum)
 [facility] Specialty and Home Delivery Pharmacy Refill Coordination Note    Specialty Medication(s) to be Shipped:   Transplant: tacrolimus  0.5mg     Other medication(s) to be shipped: Levothyroxine  75mcg, Diltiazem  180mg     Specialty Medications not needed at this Dickerson: Transplant: mycophenolate  mofetil 500mg      Andrew Dickerson, DOB: Sep 01, 1956  Phone: There are no phone numbers on file.    30 day supply of tacrolimus  scheduled to deliver 11/5    All above HIPAA information was verified with patient.     Was a nurse, learning disability used for this call? No    Completed refill call assessment today to schedule patient's medication shipment from the [facility] and Home Delivery Pharmacy  [phone]).  All relevant notes have been reviewed.     Specialty medication(s) and dose(s) confirmed: Regimen is correct and unchanged.   Changes to medications: Andrew Dickerson.  Changes to insurance: No  New side effects reported not previously addressed with a pharmacist or physician: None reported  Questions for the pharmacist: No    Confirmed patient received a Conservation Officer, Historic Buildings and a Surveyor, Mining with first shipment. The patient will receive a drug information handout for each medication shipped and additional FDA Medication Guides as required.       DISEASE/MEDICATION-SPECIFIC INFORMATION        N/A    SPECIALTY MEDICATION ADHERENCE     Medication Adherence    Patient reported X missed doses in the last month: 0  Specialty Medication: Mycophenolate  500mg   Patient is on additional specialty medications: Yes  Additional Specialty Medications: Tacrolimus  0.5mg   Patient Reported Additional Medication X Missed Doses in the Last Month: 0  Patient is on more than two specialty medications: No  Informant: patient              Were doses missed due to medication being on hold? No    Tacrolimus  0.5 mg: 7 days of medicine on hand   Mycophenolate  500 mg: 60 days of medicine on hand       REFERRAL TO PHARMACIST Referral to the pharmacist: Not needed      [facility]     Shipping address confirmed in Epic.     Cost and Payment: Patient has a copay of $9.79 anticipated, rts 11/1. They are aware and have authorized the pharmacy to charge the credit card on file.    Delivery Scheduled: Yes, Expected medication delivery date: 11/5.     Medication will be delivered via UPS to the prescription address in Epic WAM.    Andrew Dickerson, PharmD   [facility] Specialty and Home Delivery Pharmacy  Specialty Pharmacist

## 2024-07-10 MED FILL — TACROLIMUS 0.5 MG CAPSULE, IMMEDIATE-RELEASE: ORAL | 30 days supply | Qty: 90 | Fill #3

## 2024-07-10 MED FILL — DILTIAZEM CD 180 MG CAPSULE,EXTENDED RELEASE 24 HR: ORAL | 30 days supply | Qty: 60 | Fill #7

## 2024-07-10 MED FILL — LEVOTHYROXINE 75 MCG TABLET: ORAL | 30 days supply | Qty: 30 | Fill #7

## 2024-07-23 ENCOUNTER — Ambulatory Visit (INDEPENDENT_AMBULATORY_CARE_PROVIDER_SITE_OTHER)

## 2024-07-23 VITALS — BP 148/69 | HR 87 | Temp 96.8°F | Ht 68.0 in | Wt 230.0 lb

## 2024-07-23 DIAGNOSIS — Z0000 Encounter for general adult medical examination without abnormal findings: Secondary | ICD-10-CM

## 2024-07-23 NOTE — Progress Notes (Signed)
 Chief Complaint  Patient presents with   Medicare Wellness     Subjective:   [name] is a 68 y.o. male who presents for a Medicare Annual Wellness Visit.  Allergies (verified) Patient has no known allergies.   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   on old heart   Blood transfusion without reported diagnosis    CHF (congestive heart failure) (HCC)    Chronic kidney disease    CORONARY ARTERY DISEASE 04/25/2007   DIABETES MELLITUS, TYPE II 12/13/2008   no meds, diet contolled   GERD (gastroesophageal reflux disease)    HYPERLIPIDEMIA 04/25/2007   Hypertension    Hypothyroidism    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   MYOCARDIAL INFARCTION, 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   OBESITY 02/26/2009   THYROID  DISEASE, HX OF 05/14/2008   VENTRICULAR TACHYCARDIA 02/26/2009   Past Surgical History:  Procedure Laterality Date   ANGIOPLASTY     with stent   CARDIAC ASSIST DEVICE REMOVAL     CARDIAC CATHETERIZATION     COLONOSCOPY     CORONARY ARTERY BYPASS GRAFT     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   LEFT VENTRICULAR ASSIST DEVICE  05/2009   [facility]   TRANSURETHRAL RESECTION OF PROSTATE N/A 06/21/2023   Procedure: TRANSURETHRAL RESECTION OF THE PROSTATE (TURP);  Surgeon: Elisabeth Valli BIRCH, MD;  Location: [facility];  Service: Urology;  Laterality: N/A;  60 MINUTES   Family History  Problem Relation Age of Onset   Heart disease Mother    Lung cancer Maternal Grandmother    Lung cancer Maternal Grandfather    Heart disease Maternal Grandfather    Alcohol abuse Maternal Uncle    Colon cancer Neg Hx    Esophageal cancer Neg Hx    Prostate cancer Neg Hx    Rectal cancer Neg Hx    Stomach cancer Neg Hx    Social History   Occupational History   Not on file  Tobacco Use   Smoking  status: Former    Current packs/day: 0.00    Average packs/day: 1 pack/day for 20.0 years (20.0 ttl pk-yrs)    Types: Cigarettes    Start date: 07/02/1979    Quit date: 07/02/1999    Years since quitting: 25.0   Smokeless tobacco: Never  Vaping Use   Vaping status: Never Used  Substance and Sexual Activity   Alcohol use: No    Comment: once yearly   Drug use: No   Sexual activity: Not on file   Tobacco Counseling Counseling given: Not Answered  SDOH Screenings   Food Insecurity: No Food Insecurity (07/23/2024)  Housing: Unknown (07/23/2024)  Transportation Needs: No Transportation Needs (07/23/2024)  Utilities: Not At Risk (07/23/2024)  Alcohol Screen: Low Risk  (11/20/2023)  Depression (PHQ2-9): Low Risk  (07/23/2024)  Financial Resource Strain: Low Risk  (11/20/2023)  Physical Activity: Inactive (07/23/2024)  Social Connections: Unknown (07/23/2024)  Stress: No Stress Concern Present (07/23/2024)  Tobacco Use: Medium Risk (07/23/2024)  Health Literacy: Adequate Health Literacy (07/23/2024)   See flowsheets for full screening details  Depression Screen PHQ 2 & 9 Depression Scale- Over the past 2 weeks, how often have you been bothered by any of the following problems? Little interest or pleasure in doing things: 0 Feeling down, depressed, or hopeless (PHQ Adolescent also includes...irritable): 0 PHQ-2  Total Score: 0     Goals Addressed               This Visit's Progress     maintain health and activity (pt-stated)        Maintain health and activity       Visit info / Clinical Intake: Medicare Wellness Visit Type:: Subsequent Annual Wellness Visit Persons participating in visit:: patient Medicare Wellness Visit Mode:: Telephone If telephone:: video declined Because this visit was a virtual/telehealth visit:: pt reported vitals If Telephone or Video please confirm:: I connected with the patient using audio enabled telemedicine application and verified  that I am speaking with the correct person using two identifiers Patient Location:: home Provider Location:: office Information given by:: patient Interpreter Needed?: No Pre-visit prep was completed: yes AWV questionnaire completed by patient prior to visit?: no Living arrangements:: with family/others Patient's Overall Health Status Rating: good Typical amount of pain: none Does pain affect daily life?: no Are you currently prescribed opioids?: (!) yes  Dietary Habits and Nutritional Risks How many meals a day?: 2 Eats fruit and vegetables daily?: (!) no (no fruits) Most meals are obtained by: eating out In the last 2 weeks, have you had any of the following?: none Diabetic:: (!) yes (173 before meals) Any non-healing wounds?: no How often do you check your BS?: 1 Would you like to be referred to a Nutritionist or for Diabetic Management? : no  Functional Status Activities of Daily Living (to include ambulation/medication): Independent Ambulation: Independent with device- listed below Home Assistive Devices/Equipment: Eyeglasses; Cane Medication Administration: Independent (cane when outside) Home Management: Independent Manage your own finances?: yes Primary transportation is: driving Concerns about vision?: no *vision screening is required for [facility]* Concerns about hearing?: (!) yes (has hearing aids) Uses hearing aids?: (!) yes (lost) Hear whispered voice?: yes  Fall Screening Falls in the past year?: 0 Number of falls in past year: 0 Was there an injury with Fall?: 0 Fall Risk Category Calculator: 0 Patient Fall Risk Level: Low Fall Risk  Fall Risk Patient at Risk for Falls Due to: No Fall Risks Fall risk Follow up: Falls prevention discussed  Home and Transportation Safety: All rugs have non-skid backing?: N/A, no rugs All stairs or steps have railings?: yes Grab bars in the bathtub or shower?: (!) no Have non-skid surface in bathtub or shower?: (!) no Good  home lighting?: yes Regular seat belt use?: yes Hospital stays in the last year:: no  Cognitive Assessment Difficulty concentrating, remembering, or making decisions? : no Will 6CIT or Mini Cog be Completed: no 6CIT or Mini Cog Declined: patient alert, oriented, able to answer questions appropriately and recall recent events  Advance Directives (For Healthcare) Does Patient Have a Medical Advance Directive?: No Would patient like information on creating a medical advance directive?: No - Patient declined  Reviewed/Updated  Reviewed/Updated: Reviewed All (Medical, Surgical, Family, Medications, Allergies, Care Teams, Patient Goals)        Objective:    Today's Vitals   07/23/24 1536  BP: (!) 148/69  Pulse: 87  Temp: (!) 96.8 F (36 C)  SpO2: 98%  Weight: 230 lb (104.3 kg)  Height: 5' 8 (1.727 m)   Body mass index is 34.97 kg/m.  Current Medications (verified) Outpatient Encounter Medications as of 07/23/2024  Medication Sig   ACCU-CHEK GUIDE TEST test strip USE  STRIP 4 TIMES DAILY   amLODipine  (NORVASC ) 10 MG tablet Take 10 mg by mouth in the morning.  aspirin  EC 81 MG tablet Take 1 tablet (81 mg total) by mouth daily.   atorvastatin  (LIPITOR ) 80 MG tablet Take 1 tablet (80 mg total) by mouth daily at 6 PM.   blood glucose meter kit and supplies KIT Dispense based on patient and insurance preference. Use up to four times daily as directed.   cholecalciferol (VITAMIN D ) 1000 units tablet Take 3 tablets (3,000 Units total) by mouth daily.   diltiazem  (TIAZAC ) 180 MG 24 hr capsule Take 2 capsules (360 mg total) by mouth daily.   ezetimibe  (ZETIA ) 10 MG tablet Take 1 tablet by mouth once daily   famotidine  (PEPCID ) 20 MG tablet Take 20 mg by mouth in the morning and at bedtime.   levothyroxine  (SYNTHROID , LEVOTHROID) 75 MCG tablet Take 1 tablet (75 mcg total) by mouth daily before breakfast.   magnesium  oxide (MAG-OX) 400 (240 Mg) MG tablet Take 400 mg by mouth in the  morning.   metFORMIN  (GLUCOPHAGE ) 1000 MG tablet TAKE 1 TABLET BY MOUTH TWICE DAILY WITH MEALS   mycophenolate  (CELLCEPT ) 500 MG tablet Take 1 tablet (500 mg total) by mouth 2 (two) times daily.   silodosin (RAPAFLO) 8 MG CAPS capsule Take 8 mg by mouth daily.   tacrolimus  (PROGRAF ) 0.5 MG capsule Take 2 capsules (1 mg total) by mouth 2 (two) times daily.   valsartan  (DIOVAN ) 160 MG tablet Take 320 mg by mouth daily.   [DISCONTINUED] magnesium  oxide (MAG-OX) 400 MG tablet Take 1.5 tablets (600 mg total) by mouth 2 (two) times daily. (Patient taking differently: Take 400 mg by mouth daily.)   No facility-administered encounter medications on file as of 07/23/2024.   Hearing/Vision screen Hearing Screening - Comments:: Pt has hearing aids , however lost one  Vision Screening - Comments:: Wears rx glasses - up to date with routine eye exams with Dr Sigurd  Immunizations and Health Maintenance Health Maintenance  Topic Date Due   COVID-19 Vaccine (5 - 2025-26 season) 05/07/2024   HEMOGLOBIN A1C  11/25/2024   OPHTHALMOLOGY EXAM  05/14/2025   FOOT EXAM  05/28/2025   Diabetic kidney evaluation - Urine ACR  06/18/2025   Diabetic kidney evaluation - eGFR measurement  06/19/2025   Medicare Annual Wellness (AWV)  07/23/2025   Colonoscopy  04/26/2027   Pneumococcal Vaccine: 50+ Years  Completed   Influenza Vaccine  Completed   Zoster Vaccines- Shingrix  Completed   Hepatitis C Screening  Addressed   Meningococcal B Vaccine  Aged Out   DTaP/Tdap/Td  Discontinued   Hepatitis B Vaccines 19-59 Average Risk  Discontinued        Assessment/Plan:  This is a routine wellness examination for [name].  Patient Care Team: Katrinka Garnette KIDD, MD as PCP - General (Family Medicine) Eloy Fairy Rigg, MD as Consulting Physician (Cardiology) Nicholaus Sherlean CROME, [facility] (Inactive) (Pharmacist)  I have personally reviewed and noted the following in the patient's chart:   Medical and social history Use of  alcohol, tobacco or illicit drugs  Current medications and supplements including opioid prescriptions. Functional ability and status Nutritional status Physical activity Advanced directives List of other physicians Hospitalizations, surgeries, and ER visits in previous 12 months Vitals Screenings to include cognitive, depression, and falls Referrals and appointments  No orders of the defined types were placed in this encounter.  In addition, I have reviewed and discussed with patient certain preventive protocols, quality metrics, and best practice recommendations. A written personalized care plan for preventive services as well as general preventive health  recommendations were provided to patient.   Kevin VEAR Haws, LPN   [id number]     After Visit Summary: (MyChart) Due to this being a telephonic visit, the after visit summary with patients personalized plan was offered to patient via MyChart   Nurse Notes: nothing significant to report at this time

## 2024-07-23 NOTE — Patient Instructions (Signed)
 [name],  Thank you for taking the time for your Medicare Wellness Visit. I appreciate your continued commitment to your health goals. Please review the care plan we discussed, and feel free to reach out if I can assist you further.  Please note that Annual Wellness Visits do not include a physical exam. Some assessments may be limited, especially if the visit was conducted virtually. If needed, we may recommend an in-person follow-up with your provider.  Ongoing Care Seeing your primary care provider every 3 to 6 months helps us  monitor your health and provide consistent, personalized care.   Referrals If a referral was made during today's visit and you haven't received any updates within two weeks, please contact the referred provider directly to check on the status.  Recommended Screenings:  Health Maintenance  Topic Date Due   COVID-19 Vaccine (5 - 2025-26 season) 05/07/2024   Hemoglobin A1C  11/25/2024   Eye exam for diabetics  05/14/2025   Complete foot exam   05/28/2025   Yearly kidney health urinalysis for diabetes  06/18/2025   Yearly kidney function blood test for diabetes  06/19/2025   Medicare Annual Wellness Visit  07/23/2025   Colon Cancer Screening  04/26/2027   Pneumococcal Vaccine for age over 50  Completed   Flu Shot  Completed   Zoster (Shingles) Vaccine  Completed   Hepatitis C Screening  Addressed   Meningitis B Vaccine  Aged Out   DTaP/Tdap/Td vaccine  Discontinued   Hepatitis B Vaccine  Discontinued       07/23/2024    3:42 PM  Advanced Directives  Does Patient Have a Medical Advance Directive? No  Would patient like information on creating a medical advance directive? No - Patient declined    Vision: Annual vision screenings are recommended for early detection of glaucoma, cataracts, and diabetic retinopathy. These exams can also reveal signs of chronic conditions such as diabetes and high blood pressure.  Dental: Annual dental screenings help  detect early signs of oral cancer, gum disease, and other conditions linked to overall health, including heart disease and diabetes.  Please see the attached documents for additional preventive care recommendations.

## 2024-07-26 ENCOUNTER — Other Ambulatory Visit: Payer: Self-pay | Admitting: Family Medicine

## 2024-07-26 DIAGNOSIS — R7309 Other abnormal glucose: Secondary | ICD-10-CM

## 2024-07-31 DIAGNOSIS — Z941 Heart transplant status: Principal | ICD-10-CM

## 2024-07-31 MED ORDER — ASPIRIN 81 MG TABLET,DELAYED RELEASE
ORAL_TABLET | Freq: Every day | ORAL | 3 refills | 90.00000 days
Start: 2024-07-31 — End: ?

## 2024-07-31 MED ORDER — VALSARTAN 160 MG TABLET
ORAL_TABLET | Freq: Every day | ORAL | 3 refills | 90.00000 days | Status: CP
Start: 2024-07-31 — End: 2025-07-31
  Filled 2024-08-07: qty 180, 90d supply, fill #0

## 2024-07-31 MED ORDER — FAMOTIDINE 20 MG TABLET
ORAL_TABLET | Freq: Two times a day (BID) | ORAL | 3 refills | 90.00000 days
Start: 2024-07-31 — End: 2025-07-31

## 2024-07-31 MED ORDER — AMLODIPINE 10 MG TABLET
ORAL_TABLET | Freq: Every day | ORAL | 3 refills | 90.00000 days
Start: 2024-07-31 — End: 2025-07-31

## 2024-07-31 MED ORDER — MYCOPHENOLATE MOFETIL 500 MG TABLET
ORAL_TABLET | Freq: Two times a day (BID) | ORAL | 3 refills | 90.00000 days | Status: CP
Start: 2024-07-31 — End: 2025-07-31
  Filled 2024-08-07: qty 60, 30d supply, fill #0

## 2024-07-31 NOTE — Progress Notes (Signed)
 [facility] Clinical Assessment & Refill Coordination Note    Andrew Dickerson, DOB: 10/13/1955  Phone: There are no phone numbers on file.    All above HIPAA information was verified with patient.     Was a nurse, learning disability used for this call? 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    Medication list has been reviewed and updated in Epic: Yes    Allergies[2]    Changes to allergies: No    Allergies have been reviewed and updated in Epic: Yes    SPECIALTY MEDICATION ADHERENCE     Mycophenolate  500 mg: 10 days of medicine on hand   Tacrolimus  0.5 mg: 10 days of medicine on hand       Medication Adherence    Patient reported X missed doses in the last month: 0  Specialty Medication: Mycophenolate  500mg   Patient is on additional specialty medications: Yes  Additional Specialty Medications: Tacrolimus  0.5mg   Patient Reported Additional Medication X Missed Doses in the Last Month: 0  Patient is on more than two specialty medications: No  Informant: patient          Specialty medication(s) dose(s) confirmed: Regimen is correct and unchanged.     Are there any concerns with adherence? No    Adherence counseling provided? Not needed    CLINICAL MANAGEMENT AND INTERVENTION      Clinical Benefit Assessment:    Do you feel the medicine is effective or helping your condition? Yes    Clinical Benefit counseling provided? Not needed    Adverse Effects Assessment:    Are you experiencing any side effects? No    Are you experiencing difficulty administering your medicine? No    Quality of Life Assessment:    Quality of Life    Rheumatology  Oncology  Dermatology  Cystic Fibrosis          How many days over the past month did your heart transplant  keep you from your normal activities? For example, brushing your teeth or getting up in the morning. 0    Have you discussed this with your provider? Not needed    Acute Infection Status:    Acute infections noted within Epic:  No active infections    Patient reported infection: None    Therapy Appropriateness:    Is the medication and dose appropriate considering the patient???s diagnosis, treatment, and disease journey, comorbidities, medical history, current medications, allergies, therapeutic goals, self-administration ability, and access barriers? Yes, therapy is appropriate and should be continued     Clinical Intervention:    Was an intervention completed as part of this clinical assessment? No    DISEASE/MEDICATION-SPECIFIC INFORMATION      N/A    Solid Organ Transplant: Not Applicable    PATIENT SPECIFIC NEEDS     Does the patient have any physical, cognitive, or cultural barriers? No    Is the patient high risk? Yes, patient is taking a REMS drug. Medication is dispensed in compliance with REMS program    Does the patient require physician intervention or other additional services (i.e., nutrition, smoking cessation, social work)? No    Does the patient have an additional or emergency contact listed in their chart? Yes    SOCIAL DETERMINANTS OF HEALTH     At the [facility],  we have learned that life circumstances - like trouble affording food, housing, utilities, or transportation can affect the health of many of our patients.   That is why we wanted to ask: are you currently experiencing any life circumstances that are negatively impacting your health and/or quality of life? Patient declined to answer    Social Drivers of Health     Food Insecurity: No Food Insecurity (11/20/2023)    Received from [facility]    Hunger Vital Sign     Within the past 12 months, you worried that your food would run out before you got the money to buy more.: Never true     Within the past 12 months, the food you bought just didn't last and you didn't have money to get more.: Never true   Tobacco Use: Medium Risk (05/15/2024)    Patient History     Smoking Tobacco Use: Former     Smokeless Tobacco Use: Never     Passive Exposure: Not on file   Transportation Needs: No Transportation Needs (11/20/2023)    Received from [facility] - Transportation     Lack of Transportation (Medical): No     Lack of Transportation (Non-Medical): No   Alcohol Use: Not on file   Housing: Low Risk (04/19/2023)    Housing     Within the past 12 months, have you ever stayed: outside, in a car, in a tent, in an overnight shelter, or temporarily in someone else's home (i.e. couch-surfing)?: No     Are you worried about losing your housing?: No   Physical Activity: Inactive (11/20/2023)    Received from [facility]    Exercise Vital Sign     On average, how many days per week do you engage in moderate to strenuous exercise (like a brisk walk)?: 0 days     On average, how many minutes do you engage in exercise at this level?: 30 min   Utilities: Not At Risk (07/19/2023)    Received from [facility]     Threatened with loss of utilities: No   Stress: No Stress Concern Present (11/20/2023)    Received from [facility] - Occupational Stress Questionnaire     Feeling of Stress : Not at all   Interpersonal Safety: Not on file   Substance Use: Not on file (07/17/2023)   Intimate Partner Violence: Not At Risk (07/19/2023)    Received from [facility]    Humiliation, Afraid, Rape, and Kick questionnaire     Within the last year, have you been afraid of your partner or ex-partner?: No     Within the last year, have you been humiliated or emotionally abused in other ways by your partner or ex-partner?: No     Within the last year, have you been kicked, hit, slapped, or otherwise physically hurt by your partner or ex-partner?: No     Within the last year, have you been raped or forced to have any kind of sexual activity by your partner or ex-partner?: No   Social Connections: Moderately Isolated (11/20/2023)    Received from [facility]    Social Connection and Isolation Panel     In a typical week, how many times do you talk on the phone with family, friends, or neighbors?: Never     How often do you get together with friends or relatives?: Once a week  How often do you attend church or religious services?: More than 4 times per year     Do you belong to any clubs or organizations such as church groups, unions, fraternal or athletic groups, or school groups?: Yes     How often do you attend meetings of the clubs or organizations you belong to?: More than 4 times per year     Are you married, widowed, divorced, separated, never married, or living with a partner?: Divorced   Physicist, Medical Strain: Low Risk  (11/20/2023)    Received from [facility]    Overall Financial Resource Strain (CARDIA)     Difficulty of Paying Living Expenses: Not very hard   Health Literacy: Adequate Health Literacy (07/19/2023)    Received from [facility]    B1300 Health Literacy     Frequency of need for help with medical instructions: Never   Internet Connectivity: Internet connectivity concern unknown (04/19/2023)    Internet Connectivity     Do you have access to internet services: Yes     How do you connect to the internet: Not on file     Is your internet connection strong enough for you to watch video on your device without major problems?: Not on file     Do you have enough data to get through the month?: Not on file     Does at least one of the devices have a camera that you can use for video chat?: Not on file   Recent Concern: Internet Connectivity - Possible Internet connectivity concern identified (04/19/2023)    Internet Connectivity     Do you have access to internet services: Yes     How do you connect to the internet: Not on file     Is your internet connection strong enough for you to watch video on your device without major problems?: Not on file     Do you have enough data to get through the month?: Not on file     Does at least one of the devices have a camera that you can use for video chat?: Not on file       Would you be willing to receive help with any of the needs that you have identified today? Not applicable       SHIPPING     Specialty Medication(s) to be Shipped:   Transplant: mycophenolate  mofetil 500mg  and tacrolimus  0.5mg     Other medication(s) to be shipped: Amlodipine , ASA, Atorvastatin , Diltiazem , Famotidine , Levothyroxine , Valsartan     Specialty Medications not needed at this time: N/A     Changes to insurance: No    Cost and Payment: Patient has a copay of $37.37 anticipated. They are aware and have authorized the pharmacy to charge the credit card on file.    Delivery Scheduled: Yes, Expected medication delivery date: 12/3.  However, Rx request for refills was sent to the provider as there are none remaining.     Medication will be delivered via UPS to the confirmed prescription address in [facility].    The patient will receive a drug information handout for each medication shipped and additional FDA Medication Guides as required.  Verified that patient has previously received a Conservation Officer, Historic Buildings and a Surveyor, Mining.    The patient or caregiver noted above participated in the development of this care plan and knows that they can request review of or adjustments to the care plan at any time.  All of the patient's questions and concerns have been addressed.    Harlene Gal, PharmD   [facility] Specialty Pharmacist       [1]   Current Outpatient Medications   Medication Sig Dispense Refill    amlodipine  (NORVASC ) 10 MG tablet Take 1 tablet (10 mg total) by mouth daily. 90 tablet 3    aspirin  (ADULT LOW DOSE ASPIRIN ) 81 MG tablet Take 1 tablet (81 mg total) by mouth daily. 90 tablet 3    atorvastatin  (LIPITOR) 80 MG tablet Take 1 tablet (80 mg total) by mouth daily. 90 tablet 3    chlorthalidone  (HYGROTON ) 25 MG tablet Take 0.5 tablets (12.5 mg total) by mouth every morning. cholecalciferol, vitamin D3, 1,000 unit capsule Take 3 capsules (75 mcg total) by mouth daily.      dilTIAZem  (CARDIZEM  CD) 180 MG 24 hr capsule Take 2 capsules (360 mg total) by mouth daily. 60 capsule 11    ezetimibe  (ZETIA ) 10 mg tablet Take 1 tablet (10 mg total) by mouth daily.      famotidine  (PEPCID ) 20 MG tablet Take 1 tablet (20 mg total) by mouth Two (2) times a day. 180 tablet 3    levothyroxine  (SYNTHROID ) 75 MCG tablet Take 1 tablet (75 mcg total) by mouth daily. 30 tablet 11    magnesium  oxide (MAG-OX) 400 mg (241.3 mg elemental magnesium ) tablet Take 1 tablet (400 mg total) by mouth daily.      metFORMIN (GLUCOPHAGE) 1000 MG tablet Take 1 tablet (1,000 mg total) by mouth three (3) times a day. (Patient taking differently: Take 1 tablet (1,000 mg total) by mouth two (2) times a day.)      mycophenolate  (CELLCEPT ) 500 mg tablet Take 1 tablet (500 mg total) by mouth Two (2) times a day. 180 tablet 3    tacrolimus  (PROGRAF ) 0.5 MG capsule Take 2 capsules (1 mg total) by mouth daily AND 1 capsule (0.5 mg total) nightly. 90 capsule 11    valsartan  (DIOVAN ) 320 MG tablet Take 1 tablet (320 mg total) by mouth daily.       No current facility-administered medications for this visit.   [2] No Known Allergies

## 2024-07-31 NOTE — Telephone Encounter (Signed)
 Heart patient :)

## 2024-08-01 MED ORDER — FAMOTIDINE 20 MG TABLET
ORAL_TABLET | Freq: Two times a day (BID) | ORAL | 3 refills | 90.00000 days | Status: CP
Start: 2024-08-01 — End: 2025-08-01
  Filled 2024-08-07: qty 180, 90d supply, fill #0

## 2024-08-01 MED ORDER — ASPIRIN 81 MG TABLET,DELAYED RELEASE
ORAL_TABLET | Freq: Every day | ORAL | 3 refills | 90.00000 days | Status: CP
Start: 2024-08-01 — End: ?
  Filled 2024-08-07: qty 90, 90d supply, fill #0

## 2024-08-01 MED ORDER — AMLODIPINE 10 MG TABLET
ORAL_TABLET | Freq: Every day | ORAL | 3 refills | 90.00000 days | Status: CP
Start: 2024-08-01 — End: 2025-08-01
  Filled 2024-08-07: qty 90, 90d supply, fill #0

## 2024-08-07 MED ORDER — LEVOTHYROXINE 75 MCG TABLET
ORAL_TABLET | Freq: Every day | ORAL | 3 refills | 90.00000 days | Status: CP
Start: 2024-08-07 — End: 2025-08-07
  Filled 2024-08-07: qty 90, 90d supply, fill #0

## 2024-08-07 MED ORDER — DILTIAZEM CD 180 MG CAPSULE,EXTENDED RELEASE 24 HR
ORAL_CAPSULE | Freq: Every day | ORAL | 3 refills | 90.00000 days | Status: CP
Start: 2024-08-07 — End: ?
  Filled 2024-08-07: qty 60, 30d supply, fill #0

## 2024-08-07 MED FILL — ATORVASTATIN 80 MG TABLET: ORAL | 90 days supply | Qty: 90 | Fill #2

## 2024-08-07 MED FILL — TACROLIMUS 0.5 MG CAPSULE, IMMEDIATE-RELEASE: ORAL | 30 days supply | Qty: 90 | Fill #4

## 2024-08-16 ENCOUNTER — Encounter: Payer: Self-pay | Admitting: Gastroenterology

## 2024-08-16 ENCOUNTER — Ambulatory Visit: Admitting: Gastroenterology

## 2024-08-16 VITALS — BP 132/82 | HR 69 | Ht 68.0 in | Wt 237.5 lb

## 2024-08-16 DIAGNOSIS — R1310 Dysphagia, unspecified: Secondary | ICD-10-CM | POA: Diagnosis not present

## 2024-08-16 DIAGNOSIS — K219 Gastro-esophageal reflux disease without esophagitis: Secondary | ICD-10-CM

## 2024-08-16 DIAGNOSIS — R1319 Other dysphagia: Secondary | ICD-10-CM

## 2024-08-16 NOTE — Progress Notes (Signed)
 [facility] Consult Note:  History: [name] 08/16/2024  Referring provider: Katrinka Garnette KIDD, MD  Reason for consult/chief complaint: Dysphagia (Pt states he has trouble swallowing food and it comes and goes )   Subjective  Prior history:  Diminutive adenomatous colon polyp January 2015 (Dr. Debrah) Diminutive polypoid lesion removed August 2018 (Dr. Lezlie colon tissue, 10-year recall recommended (prep good after lavage)   History of Present Illness   [name] is a very pleasant 68 year old man I previously saw for a surveillance colonoscopy.  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  He most recently saw cardiology September 9 (details of their note and evaluation noted below).  He is here for intermittent dysphagia that has been occurring for several years.  He describes it as a feeling of pressure and things feeling stuck in the chest almost exclusively when he drinks cold liquid.  He has also noted it is more prominent during certain seasons of the year.  When asked if he has dysphagia to food, it is somewhat difficult to tell but sounds like that might only occur when he is already had the symptoms triggered by cold liquids. He rarely if ever gets heartburn, though has been on twice daily Pepcid  for many years (he cannot recall exactly how long who prescribed it).  This was reportedly started for heartburn as best he can recall and he has always continued it.  Distant smoking history, no family history of Barrett's or esophageal cancer that he is aware of.  Denies nausea vomiting or weight loss.  He got concerned about the symptoms because during 1 episode more recently he had a sudden increase in throat thick mucus.   ROS:  Review of Systems  Constitutional:  Negative for appetite change and unexpected weight change.  HENT:  Negative for mouth sores and voice change.    Eyes:  Negative for pain and redness.  Respiratory:  Negative for cough and shortness of breath.   Cardiovascular:  Negative for chest pain and palpitations.  Genitourinary:  Negative for dysuria and hematuria.  Musculoskeletal:  Negative for arthralgias and myalgias.  Skin:  Negative for pallor and rash.  Neurological:  Negative for weakness and headaches.  Hematological:  Negative for adenopathy.     Past Medical 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   on old heart   Blood transfusion without reported diagnosis    CHF (congestive heart failure) (HCC)    Chronic kidney disease    CORONARY ARTERY DISEASE 04/25/2007   DIABETES MELLITUS, TYPE II 12/13/2008   no meds, diet contolled   GERD (gastroesophageal reflux disease)    HYPERLIPIDEMIA 04/25/2007   Hypertension    Hypothyroidism    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   MYOCARDIAL INFARCTION, 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   OBESITY 02/26/2009   THYROID  DISEASE, HX OF 05/14/2008   VENTRICULAR TACHYCARDIA 02/26/2009   [facility] 05/15/24: # Heart transplant 07/18/12, Immunosuppression. On dual immunosuppression (Mycophenolate  500 bid and Tacrolimus  1 mg /0.5 mg with goal 5-8) with normal cardiac function as again seen on today's echo. No significant DSA's detected after 2018 (previously occasional single low level DSA (DR 16). No CAV. - Recent Tac levels below, at goal. (Last tac dose adjustment 01/2018). No  changes today (stable Cr).  Lab Results  Component Value Date  Tacrolimus , Trough 6.4 04/27/2024  Tacrolimus , Trough 6.9 09/27/2023  Tacrolimus , Trough 7.6 11/29/2014  Tacrolimus , Trough 9.2 07/24/2014  Tacrolimus , Timed 4.8 (L) 03/22/2024  - His next diagnostic testing will be a left heart catheterization in February 2026. -  Return to clinic 1 year, sooner PRN   Past Surgical History: Past Surgical History:  Procedure Laterality Date   ANGIOPLASTY     with stent   CARDIAC ASSIST DEVICE REMOVAL     CARDIAC CATHETERIZATION     COLONOSCOPY     CORONARY ARTERY BYPASS GRAFT     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   LEFT VENTRICULAR ASSIST DEVICE  05/2009   [facility]   TRANSURETHRAL RESECTION OF PROSTATE N/A 06/21/2023   Procedure: TRANSURETHRAL RESECTION OF THE PROSTATE (TURP);  Surgeon: Elisabeth Valli BIRCH, MD;  Location: [facility];  Service: Urology;  Laterality: N/A;  60 MINUTES     Family History: Family History  Problem Relation Age of Onset   Heart disease Mother    Lung cancer Maternal Grandmother    Lung cancer Maternal Grandfather    Heart disease Maternal Grandfather    Alcohol abuse Maternal Uncle    Colon cancer Neg Hx    Esophageal cancer Neg Hx    Prostate cancer Neg Hx    Rectal cancer Neg Hx    Stomach cancer Neg Hx     Social History: Social History   Socioeconomic History   Marital status: Divorced    Spouse name: Not on file   Number of children: 1   Years of education: Not on file   Highest education level: 12th grade  Occupational History   Not on file  Tobacco Use   Smoking status: Former    Current packs/day: 0.00    Average packs/day: 1 pack/day for 20.0 years (20.0 ttl pk-yrs)    Types: Cigarettes    Start date: 07/02/1979    Quit date: 07/02/1999    Years since quitting: 25.1   Smokeless tobacco: Never  Vaping Use   Vaping status: Never Used  Substance and Sexual Activity   Alcohol use: No    Comment: once yearly   Drug use: No   Sexual activity: Not on file  Other Topics Concern   Not on file  Social History Narrative   Single. Divorced. 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       Hobbies: time on CPU   Social Drivers of Health   Tobacco Use: Medium Risk (07/23/2024)   Patient History    Smoking Tobacco Use:  Former    Smokeless Tobacco Use: Never    Passive Exposure: Not on file  Financial Resource Strain: Low Risk (11/20/2023)   Overall Financial Resource Strain (CARDIA)    Difficulty of Paying Living Expenses: Not very hard  Food Insecurity: No Food Insecurity (07/23/2024)   Epic    Worried About Programme Researcher, Broadcasting/film/video in the Last Year: Never true    Ran Out of Food in the Last Year: Never true  Transportation Needs: No Transportation Needs (07/23/2024)   Epic    Lack of Transportation (Medical): No    Lack of Transportation (Non-Medical): No  Physical Activity: Inactive (07/23/2024)   Exercise Vital Sign    Days of Exercise per Week: 0 days    Minutes of Exercise per Session: 0 min  Stress: No Stress  Concern Present (07/23/2024)   [name] - Occupational Stress Questionnaire    Feeling of Stress: Not at all  Social Connections: Unknown (07/23/2024)   Social Connection and Isolation Panel    Frequency of Communication with Friends and Family: Twice a week    Frequency of Social Gatherings with Friends and Family: More than three times a week    Attends Religious Services: Not on file    Active Member of Clubs or Organizations: No    Attends Banker Meetings: Never    Marital Status: Divorced  Depression (PHQ2-9): Low Risk (07/23/2024)   Depression (PHQ2-9)    PHQ-2 Score: 0  Alcohol Screen: Low Risk (11/20/2023)   Alcohol Screen    Last Alcohol Screening Score (AUDIT): 1  Housing: Unknown (07/23/2024)   Epic    Unable to Pay for Housing in the Last Year: No    Number of Times Moved in the Last Year: Not on file    Homeless in the Last Year: No  Utilities: Not At Risk (07/23/2024)   Epic    Threatened with loss of utilities: No  Health Literacy: Adequate Health Literacy (07/23/2024)   B1300 Health Literacy    Frequency of need for help with medical instructions: Never    Allergies: Allergies[1]  Outpatient Meds: Current  Outpatient Medications  Medication Sig Dispense Refill   ACCU-CHEK GUIDE TEST test strip USE  STRIP 4 TIMES DAILY 100 each 0   amLODipine  (NORVASC ) 10 MG tablet Take 10 mg by mouth in the morning.     aspirin  EC 81 MG tablet Take 1 tablet (81 mg total) by mouth daily. 30 tablet 0   atorvastatin  (LIPITOR ) 80 MG tablet Take 1 tablet (80 mg total) by mouth daily at 6 PM. 30 tablet 0   blood glucose meter kit and supplies KIT Dispense based on patient and insurance preference. Use up to four times daily as directed. 1 each 0   cholecalciferol (VITAMIN D ) 1000 units tablet Take 3 tablets (3,000 Units total) by mouth daily. 90 tablet 0   diltiazem  (TIAZAC ) 180 MG 24 hr capsule Take 2 capsules (360 mg total) by mouth daily. 60 capsule 0   ezetimibe  (ZETIA ) 10 MG tablet Take 1 tablet by mouth once daily 90 tablet 0   famotidine  (PEPCID ) 20 MG tablet Take 20 mg by mouth in the morning and at bedtime.     levothyroxine  (SYNTHROID , LEVOTHROID) 75 MCG tablet Take 1 tablet (75 mcg total) by mouth daily before breakfast. 30 tablet 0   magnesium  oxide (MAG-OX) 400 (240 Mg) MG tablet Take 400 mg by mouth in the morning.     metFORMIN  (GLUCOPHAGE ) 1000 MG tablet TAKE 1 TABLET BY MOUTH TWICE DAILY WITH MEALS 180 tablet 0   mycophenolate  (CELLCEPT ) 500 MG tablet Take 1 tablet (500 mg total) by mouth 2 (two) times daily. 60 tablet 0   silodosin (RAPAFLO) 8 MG CAPS capsule Take 8 mg by mouth daily.     tacrolimus  (PROGRAF ) 0.5 MG capsule Take 2 capsules (1 mg total) by mouth 2 (two) times daily. 120 capsule 0   valsartan  (DIOVAN ) 160 MG tablet Take 320 mg by mouth daily.     No current facility-administered medications for this visit.      ___________________________________________________________________ Objective   Exam:  BP 132/82   Pulse 69   Ht 5' 8 (1.727 m)   Wt 237 lb 8 oz (107.7 kg)   BMI 36.11 kg/m  Wt  Readings from Last 3 Encounters:  08/16/24 237 lb 8 oz (107.7 kg)  07/23/24 230 lb  (104.3 kg)  05/28/24 225 lb (102.1 kg)    General: Well-appearing, normal vocal quality Eyes: sclera anicteric, no redness ENT: oral mucosa moist without lesions, no cervical or supraclavicular lymphadenopathy.  Upper dentures, no lower teeth or dentures.  Good oral opening and good neck ROM CV: Regular without appreciable murmur, no JVD, 2+ symmetrical pretibial edema Resp: clear to auscultation bilaterally, normal RR and effort noted GI: soft, no tenderness, with active bowel sounds. No guarding or palpable organomegaly noted.  Partially reducible umbilical hernia Skin; warm and dry, no rash or jaundice noted Neuro: awake, alert and oriented x 3. Normal gross motor function and fluent speech   Labs:  Last cardiology office note reviewed.  Echocardiogram reportedly normal at that time.  Encounter Diagnoses  Name Primary?   Esophageal dysphagia Yes   Gastroesophageal reflux disease, unspecified whether esophagitis present     Assessment & Plan  The dysphagia as he describes it sounds most likely to be esophageal spasm since it is almost exclusively triggered by drinking cold liquids.  There may be some solid food trigger, though again difficult to tell based on his description.  In addition to that, he has multiple risk factors for Barrett's esophagus (chronic reflux symptoms requiring acid suppression, age, gender, smoking history).  Considering all that, I recommend an upper endoscopy to assess for any reflux esophagitis, stricture that may require dilation or Barrett's requiring biopsy.  He was agreeable after discussion of procedure and risks.  The benefits and risks of the planned procedure(s) were described in detail with the patient or (when appropriate) their health care proxy.  Risks were outlined as including, but not limited to, bleeding, infection, perforation, adverse medication reaction leading to cardiac or pulmonary decompensation, pancreatitis (if ERCP).  The limitation of  incomplete mucosal visualization was also discussed.  No guarantees or warranties were given.  Patient at increased risk for cardiopulmonary complications of procedure due to medical comorbidities.  (Obesity, cardiac history, chronic immunosuppressive therapy)    Thank you for the courtesy of this consult.  Please call me with any questions or concerns.  Victory LITTIE Brand III  CC: Referring provider noted above     [1] No Known Allergies

## 2024-08-16 NOTE — Patient Instructions (Signed)
 You have been scheduled for an endoscopy. Please follow written instructions given to you at your visit today.  If you use inhalers (even only as needed), please bring them with you on the day of your procedure.  If you take any of the following medications, they will need to be adjusted prior to your procedure:   DO NOT TAKE 7 DAYS PRIOR TO TEST- Trulicity (dulaglutide) Ozempic, Wegovy (semaglutide) Mounjaro, Zepbound (tirzepatide) Bydureon Bcise (exanatide extended release)  DO NOT TAKE 1 DAY PRIOR TO YOUR TEST Rybelsus (semaglutide) Adlyxin (lixisenatide) Victoza (liraglutide) Byetta (exanatide) ___________________________________________________________________________  Thank you for trusting me with your gastrointestinal care!    Dr. Victory Legrand [facility]

## 2024-08-22 ENCOUNTER — Ambulatory Visit: Admitting: Gastroenterology

## 2024-08-22 ENCOUNTER — Encounter: Payer: Self-pay | Admitting: Gastroenterology

## 2024-08-22 VITALS — BP 128/65 | HR 75 | Temp 97.2°F | Resp 15 | Ht 68.0 in | Wt 237.0 lb

## 2024-08-22 DIAGNOSIS — K319 Disease of stomach and duodenum, unspecified: Secondary | ICD-10-CM

## 2024-08-22 DIAGNOSIS — R09A2 Foreign body sensation, throat: Secondary | ICD-10-CM

## 2024-08-22 DIAGNOSIS — R1319 Other dysphagia: Secondary | ICD-10-CM

## 2024-08-22 DIAGNOSIS — K219 Gastro-esophageal reflux disease without esophagitis: Secondary | ICD-10-CM

## 2024-08-22 MED ORDER — SODIUM CHLORIDE 0.9 % IV SOLN: 500.0000 mL | Freq: Once | INTRAVENOUS | Status: DC

## 2024-08-22 NOTE — Progress Notes (Signed)
 Called to room to assist during endoscopic procedure.  Patient ID and intended procedure confirmed with present staff. Received instructions for my participation in the procedure from the performing physician.

## 2024-08-22 NOTE — Op Note (Signed)
 [facility] Patient Name: [name] Procedure Date: 08/22/2024 8:40 AM MRN: [id number] Endoscopist: Victory L. Legrand , MD, [id number] Age: 68 Referring MD:  Date of Birth: [birth year] Gender: Male Account #: [id number] Procedure:                Upper GI endoscopy Indications:              Dysphagia, Screening for Barrett's esophagus in                            patient at risk for this condition, Globus sensation                           Clinical details in 08/16/2024 office note Medicines:                Monitored Anesthesia Care Procedure:                Pre-Anesthesia Assessment:                           - Prior to the procedure, a History and Physical                            was performed, and patient medications and                            allergies were reviewed. The patient's tolerance of                            previous anesthesia was also reviewed. The risks                            and benefits of the procedure and the sedation                            options and risks were discussed with the patient.                            All questions were answered, and informed consent                            was obtained. Prior Anticoagulants: The patient has                            taken no anticoagulant or antiplatelet agents. ASA                            Grade Assessment: III - A patient with severe                            systemic disease. After reviewing the risks and                            benefits, the patient was deemed in satisfactory  condition to undergo the procedure.                           After obtaining informed consent, the endoscope was                            passed under direct vision. Throughout the                            procedure, the patient's blood pressure, pulse, and                            oxygen saturations were monitored continuously. The                             Olympus Scope [id number] was introduced through the                            mouth, and advanced to the second part of duodenum.                            The upper GI endoscopy was accomplished without                            difficulty. The patient tolerated the procedure                            well. Scope In: Scope Out: Findings:                 The larynx was normal. (Pooled mucus in the                            hypopharynx was suctioned)                           The esophagus was normal. No hiatal hernia,                            esophagitis, Barrett's mucosa, stricture or luminal                            abnormality                           Patchy inflammation characterized by adherent                            blood, congestion (edema) and erythema was found in                            the gastric fundus and in the gastric body. Several                            biopsies were obtained in the gastric body and in  the gastric antrum with cold forceps for histology.                           The exam of the stomach was otherwise normal.                           The examined duodenum was normal. Complications:            No immediate complications. Estimated Blood Loss:     Estimated blood loss was minimal. Impression:               - Normal larynx.                           - Normal esophagus.                           - Gastritis.                           - Normal examined duodenum.                           - Several biopsies were obtained in the gastric                            body and in the gastric antrum.                           The reported globus sensation and mucus production                            is likely allergic in nature. Recommendation:           - Patient has a contact number available for                            emergencies. The signs and symptoms of potential                            delayed  complications were discussed with the                            patient. Return to normal activities tomorrow.                            Written discharge instructions were provided to the                            patient.                           - Resume previous diet.                           - Continue present medications.                           -  Await pathology results.                           - Primary care evaluation for consideration of                            allergy treatments.                           Return to GI office as needed Victory L. Legrand, MD 08/22/2024 9:23:29 AM This report has been signed electronically.

## 2024-08-22 NOTE — Progress Notes (Signed)
 To pacu, VSS. Report to [email or web address]

## 2024-08-22 NOTE — Progress Notes (Signed)
 No significant changes to clinical history since GI office visit on 08/16/24.  The patient is appropriate for an endoscopic procedure in the ambulatory setting.  - Victory Brand, MD

## 2024-08-22 NOTE — Patient Instructions (Signed)
 Handout provided on gastritis.  Resume previous diet.  Continue present medications.  Await pathology results.  Primary care evaluation for consideration of allergy treatments.  Return to GI office as needed.   YOU HAD AN ENDOSCOPIC PROCEDURE TODAY AT THE [facility]:   Refer to the procedure report that was given to you for any specific questions about what was found during the examination.  If the procedure report does not answer your questions, please call your gastroenterologist to clarify.  If you requested that your care partner not be given the details of your procedure findings, then the procedure report has been included in a sealed envelope for you to review at your convenience later.  YOU SHOULD EXPECT: Some feelings of bloating in the abdomen. Passage of more gas than usual.  Walking can help get rid of the air that was put into your GI tract during the procedure and reduce the bloating. If you had a lower endoscopy (such as a colonoscopy or flexible sigmoidoscopy) you may notice spotting of blood in your stool or on the toilet paper. If you underwent a bowel prep for your procedure, you may not have a normal bowel movement for a few days.  Please Note:  You might notice some irritation and congestion in your nose or some drainage.  This is from the oxygen used during your procedure.  There is no need for concern and it should clear up in a day or so.  SYMPTOMS TO REPORT IMMEDIATELY:  Following upper endoscopy (EGD)  Vomiting of blood or coffee ground material  New chest pain or pain under the shoulder blades  Painful or persistently difficult swallowing  New shortness of breath  Fever of 100F or higher  Black, tarry-looking stools  For urgent or emergent issues, a gastroenterologist can be reached at any hour by calling [phone]. Do not use MyChart messaging for urgent concerns.    DIET:  We do recommend a small meal at first, but then you may proceed to  your regular diet.  Drink plenty of fluids but you should avoid alcoholic beverages for 24 hours.  ACTIVITY:  You should plan to take it easy for the rest of today and you should NOT DRIVE or use heavy machinery until tomorrow (because of the sedation medicines used during the test).    FOLLOW UP: Our staff will call the number listed on your records the next business day following your procedure.  We will call around 7:15- 8:00 am to check on you and address any questions or concerns that you may have regarding the information given to you following your procedure. If we do not reach you, we will leave a message.     If any biopsies were taken you will be contacted by phone or by letter within the next 1-3 weeks.  Please call us  at [phone] if you have not heard about the biopsies in 3 weeks.    SIGNATURES/CONFIDENTIALITY: You and/or your care partner have signed paperwork which will be entered into your electronic medical record.  These signatures attest to the fact that that the information above on your After Visit Summary has been reviewed and is understood.  Full responsibility of the confidentiality of this discharge information lies with you and/or your care-partner.

## 2024-08-23 ENCOUNTER — Telehealth: Payer: Self-pay

## 2024-08-23 NOTE — Telephone Encounter (Signed)
 Follow up call to pt, lm for pt to call if having any difficulty with normal activities or eating and drinking.  Also to call if any other questions or concerns.

## 2024-08-24 LAB — SURGICAL PATHOLOGY

## 2024-08-27 NOTE — Progress Notes (Signed)
 [facility] Refill Coordination Note    Specialty Medication(s) to be Shipped:   Transplant: mycophenolate  mofetil 500mg  and tacrolimus  0.5mg     Other medication(s) to be shipped: No additional medications requested for fill at this time    Specialty Medications not needed at this time: N/A     Andrew Dickerson, DOB: 1955/10/21  Phone: There are no phone numbers on file.      All above HIPAA information was verified with patient.     Was a nurse, learning disability used for this call? No    Completed refill call assessment today to schedule patient's medication shipment from the [facility]  [phone]).  All relevant notes have been reviewed.     Specialty medication(s) and dose(s) confirmed: Regimen is correct and unchanged.   Changes to medications: Andrew Dickerson reports no changes at this time.  Changes to insurance: No  New side effects reported not previously addressed with a pharmacist or physician: None reported  Questions for the pharmacist: No    Confirmed patient received a Conservation Officer, Historic Buildings and a Surveyor, Mining with first shipment. The patient will receive a drug information handout for each medication shipped and additional FDA Medication Guides as required.       DISEASE/MEDICATION-SPECIFIC INFORMATION        N/A    SPECIALTY MEDICATION ADHERENCE     Medication Adherence    Patient reported X missed doses in the last month: 0  Specialty Medication: mycophenolate  500 mg tablet (CELLCEPT )  Patient is on additional specialty medications: Yes  Additional Specialty Medications: tacrolimus  0.5 MG capsule (PROGRAF )  Patient Reported Additional Medication X Missed Doses in the Last Month: 0  Patient is on more than two specialty medications: No              Were doses missed due to medication being on hold? No      tacrolimus  0.5 MG capsule (PROGRAF ):  7-10days of medicine on hand   mycophenolate  500 mg tablet (CELLCEPT )   : 7-10 days of medicine on hand Specialty medication is an injection or given on a cycle: No    REFERRAL TO PHARMACIST     Referral to the pharmacist: Not needed      [facility]     Shipping address confirmed in Epic.     Cost and Payment: Patient has a copay of $9.39. They are aware and have authorized the pharmacy to charge the credit card on file.    Delivery Scheduled: Yes, Expected medication delivery date: 09/04/24.     Medication will be delivered via UPS to the prescription address in Epic WAM.    [facility]  Specialty Technician

## 2024-08-28 ENCOUNTER — Ambulatory Visit: Payer: Self-pay | Admitting: Gastroenterology

## 2024-09-03 MED FILL — MYCOPHENOLATE MOFETIL 500 MG TABLET: ORAL | 30 days supply | Qty: 60 | Fill #1

## 2024-09-03 MED FILL — TACROLIMUS 0.5 MG CAPSULE, IMMEDIATE-RELEASE: ORAL | 30 days supply | Qty: 90 | Fill #5

## 2024-09-16 ENCOUNTER — Other Ambulatory Visit: Payer: Self-pay | Admitting: Family Medicine

## 2024-09-25 NOTE — Telephone Encounter (Signed)
 Message sent to pt via MyChart requesting that he get his blood work drawn this week or next week. Advised that a lab slip had been sent to [facility] as well as to his MyChart. Requested a return call with any questions.

## 2024-09-27 ENCOUNTER — Other Ambulatory Visit: Payer: Self-pay | Admitting: Family Medicine

## 2024-09-28 ENCOUNTER — Other Ambulatory Visit (HOSPITAL_COMMUNITY): Payer: Self-pay

## 2024-09-30 ENCOUNTER — Other Ambulatory Visit (HOSPITAL_COMMUNITY): Payer: Self-pay

## 2024-09-30 MED ORDER — TACROLIMUS 0.5 MG PO CAPS: ORAL_CAPSULE | ORAL | 11 refills | Status: AC

## 2024-09-30 MED ORDER — DILTIAZEM HCL ER COATED BEADS 180 MG PO CP24
360.0000 mg | ORAL_CAPSULE | Freq: Every day | ORAL | 3 refills | Status: AC
  Filled 2024-10-12: qty 180, 90d supply, fill #0

## 2024-09-30 MED ORDER — ASPIRIN 81 MG PO TBEC: 81.0000 mg | DELAYED_RELEASE_TABLET | Freq: Every day | ORAL | 3 refills | Status: AC

## 2024-09-30 MED ORDER — ATORVASTATIN CALCIUM 80 MG PO TABS: 80.0000 mg | ORAL_TABLET | Freq: Every day | ORAL | 3 refills | Status: AC

## 2024-09-30 MED ORDER — MYCOPHENOLATE MOFETIL 500 MG PO TABS: 500.0000 mg | ORAL_TABLET | Freq: Two times a day (BID) | ORAL | 3 refills | Status: AC

## 2024-09-30 MED ORDER — FAMOTIDINE 20 MG PO TABS: 20.0000 mg | ORAL_TABLET | Freq: Two times a day (BID) | ORAL | 3 refills | Status: AC

## 2024-09-30 MED ORDER — VALSARTAN 160 MG PO TABS: 320.0000 mg | ORAL_TABLET | Freq: Every day | ORAL | 3 refills | Status: AC

## 2024-09-30 MED ORDER — LEVOTHYROXINE SODIUM 75 MCG PO TABS: 75.0000 ug | ORAL_TABLET | Freq: Every day | ORAL | 3 refills | Status: AC

## 2024-09-30 MED ORDER — AMLODIPINE BESYLATE 10 MG PO TABS: 10.0000 mg | ORAL_TABLET | Freq: Every day | ORAL | 3 refills | Status: AC

## 2024-10-01 ENCOUNTER — Other Ambulatory Visit: Payer: Self-pay

## 2024-10-01 LAB — PHOSPHORUS: PHOSPHORUS: 2.7 mg/dL (ref 2.1–4.3)

## 2024-10-01 LAB — LIPID PANEL
CHOLESTEROL/HDL RATIO SCREEN: 3.7 (calc)
CHOLESTEROL: 134 mg/dL
HDL CHOLESTEROL: 36 mg/dL — ABNORMAL LOW
LDL CHOLESTEROL CALCULATED: 78 mg/dL
NON-HDL CHOLESTEROL: 98 mg/dL
TRIGLYCERIDES: 123 mg/dL

## 2024-10-01 LAB — CBC W/ DIFFERENTIAL
BASOPHILS ABSOLUTE COUNT: 57 {cells}/uL (ref 0–200)
BASOPHILS RELATIVE PERCENT: 0.5 %
EOSINOPHILS ABSOLUTE COUNT: 215 {cells}/uL (ref 15–500)
EOSINOPHILS RELATIVE PERCENT: 1.9 %
HEMATOCRIT: 41.2 % (ref 39.4–51.1)
HEMOGLOBIN: 13.1 g/dL — ABNORMAL LOW (ref 13.2–17.1)
LYMPHOCYTES ABSOLUTE COUNT: 1537 {cells}/uL (ref 850–3900)
LYMPHOCYTES RELATIVE PERCENT: 13.6 %
MEAN CORPUSCULAR HEMOGLOBIN CONC: 31.8 g/dL (ref 31.6–35.4)
MEAN CORPUSCULAR HEMOGLOBIN: 28.5 pg (ref 27.0–33.0)
MEAN CORPUSCULAR VOLUME: 89.8 fL (ref 81.4–101.7)
MEAN PLATELET VOLUME: 10.4 fL (ref 7.5–12.5)
MONOCYTES ABSOLUTE COUNT: 599 {cells}/uL (ref 200–950)
MONOCYTES RELATIVE PERCENT: 5.3 %
NEUTROPHILS ABSOLUTE COUNT: 8893 {cells}/uL — ABNORMAL HIGH (ref 1500–7800)
NEUTROPHILS RELATIVE PERCENT: 78.7 %
PLATELET COUNT: 324 10*3/uL (ref 140–400)
RED BLOOD CELL COUNT: 4.59 Million/uL (ref 4.20–5.80)
RED CELL DISTRIBUTION WIDTH: 12.7 % (ref 11.0–15.0)
WHITE BLOOD CELL COUNT: 11.3 10*3/uL — ABNORMAL HIGH (ref 3.8–10.8)

## 2024-10-01 LAB — COMPREHENSIVE METABOLIC PANEL
ALBUMIN/GLOBULIN RATIO: 2 (calc) (ref 1.0–2.5)
ALBUMIN: 4.8 g/dL (ref 3.6–5.1)
ALKALINE PHOSPHATASE: 121 U/L (ref 35–144)
ALT (SGPT): 17 U/L (ref 9–46)
AST (SGOT): 11 U/L (ref 10–35)
BILIRUBIN TOTAL: 0.7 mg/dL (ref 0.2–1.2)
BLOOD UREA NITROGEN: 20 mg/dL (ref 7–25)
BUN / CREAT RATIO: 14 (calc) (ref 6–22)
CALCIUM: 10.1 mg/dL (ref 8.6–10.3)
CHLORIDE: 101 mmol/L (ref 98–110)
CO2: 21 mmol/L (ref 20–32)
CREATININE: 1.48 mg/dL — ABNORMAL HIGH (ref 0.70–1.35)
EGFR CKD-EPI NON-AA MALE: 52 mL/min/{1.73_m2} — ABNORMAL LOW
GLOBULIN, TOTAL: 2.4 g/dL (ref 1.9–3.7)
GLUCOSE RANDOM: 243 mg/dL — ABNORMAL HIGH (ref 65–99)
POTASSIUM: 5.2 mmol/L (ref 3.5–5.3)
PROTEIN TOTAL: 7.2 g/dL (ref 6.1–8.1)
SODIUM: 135 mmol/L (ref 135–146)

## 2024-10-01 LAB — HEMOGLOBIN A1C: HEMOGLOBIN A1C: 8.3 % — ABNORMAL HIGH

## 2024-10-01 LAB — PSA: PROSTATE SPECIFIC ANTIGEN: 1.63 ng/mL

## 2024-10-01 LAB — TSH: THYROID STIMULATING HORMONE: 1.88 m[IU]/L (ref 0.40–4.50)

## 2024-10-01 LAB — VITAMIN D 25 HYDROXY: VITAMIN D, TOTAL (25OH): 58 ng/mL (ref 30–100)

## 2024-10-01 LAB — MAGNESIUM: MAGNESIUM: 1.6 mg/dL (ref 1.5–2.5)

## 2024-10-01 LAB — TACROLIMUS LEVEL, TROUGH: TACROLIMUS, TROUGH: 11.3 ug/L (ref 5.0–20.0)

## 2024-10-01 NOTE — Telephone Encounter (Signed)
 Spoke with the patient regarding his lab results.  Advised that his tacrolimus  level was 11.3 with a goal of 5-8.  Patient confirmed that he had taken his tacrolimus  prior to his blood work being drawn.  Also discussed the elevated WBC of 11.3.  Patient stated that he was fine and had no symptoms of illness.  Patient was agreeable to repeating his blood work in 2 weeks.      Andrew Dickerson verbalized understanding & agreed with the plan.    Discussed recent labs with Andrew Dickerson, PharmD.  Advised that the pt had taken his tacrolimus  prior to his blood work being drawn.Also informed her that the pt had no symptoms of illness. Plan is to Make No Changes with repeat labs in 2 Weeks.    Lab slip sent to [facility] and pt's MyChart      Lab Results   Component Value Date    TACROLIMUS  11.3 09/26/2024     Goal: Tac: 5-8  Current Dose: Tacrolimus  1 mg AM / 0.5 mg PM    Lab Results   Component Value Date    BUN 20 09/26/2024    CREATININE 1.48 (H) 09/26/2024    K 5.2 09/26/2024    GLU 243 (H) 09/26/2024    MG 1.6 09/26/2024     Lab Results   Component Value Date    WBC 11.3 (H) 09/26/2024    HGB 13.1 (L) 09/26/2024    HCT 41.2 09/26/2024    PLT 324 09/26/2024    NEUTROABS 8,893 (H) 09/26/2024    EOSABS 215 09/26/2024

## 2024-10-01 NOTE — Progress Notes (Signed)
 10/01/2024 - Patient picked up 30 day supply at [facility] [place], [place]) on 09/28/2024 for medications: tacrolimus  0.5 MG capsule (PROGRAF ) and mycophenolate  500 mg tablet (CELLCEPT ). Updated RC date - 10/19/2024.

## 2024-10-08 ENCOUNTER — Ambulatory Visit: Payer: Self-pay | Admitting: Family Medicine

## 2024-10-11 NOTE — Telephone Encounter (Signed)
 Called pt to have labs drawn next week per TNC.

## 2024-10-12 ENCOUNTER — Other Ambulatory Visit (HOSPITAL_COMMUNITY): Payer: Self-pay

## 2024-10-12 ENCOUNTER — Other Ambulatory Visit: Payer: Self-pay

## 2024-10-15 ENCOUNTER — Ambulatory Visit: Payer: Self-pay | Admitting: Family Medicine

## 2025-05-30 ENCOUNTER — Encounter: Admitting: Family Medicine

## 2025-07-29 ENCOUNTER — Ambulatory Visit
